# Patient Record
Sex: Female | Born: 1940
Health system: Southern US, Community
[De-identification: ages and names within clinical notes are randomized; demographics above are authoritative.]

## PROBLEM LIST (undated history)

## (undated) DIAGNOSIS — F329 Major depressive disorder, single episode, unspecified: Secondary | ICD-10-CM

## (undated) DIAGNOSIS — M76899 Other specified enthesopathies of unspecified lower limb, excluding foot: Secondary | ICD-10-CM

## (undated) DIAGNOSIS — I639 Cerebral infarction, unspecified: Secondary | ICD-10-CM

## (undated) DIAGNOSIS — R519 Headache, unspecified: Secondary | ICD-10-CM

## (undated) DIAGNOSIS — I6381 Other cerebral infarction due to occlusion or stenosis of small artery: Secondary | ICD-10-CM

## (undated) DIAGNOSIS — F32A Depression, unspecified: Secondary | ICD-10-CM

## (undated) DIAGNOSIS — R29898 Other symptoms and signs involving the musculoskeletal system: Secondary | ICD-10-CM

## (undated) DIAGNOSIS — F411 Generalized anxiety disorder: Secondary | ICD-10-CM

## (undated) DIAGNOSIS — M5430 Sciatica, unspecified side: Secondary | ICD-10-CM

## (undated) DIAGNOSIS — C801 Malignant (primary) neoplasm, unspecified: Secondary | ICD-10-CM

## (undated) DIAGNOSIS — I2510 Atherosclerotic heart disease of native coronary artery without angina pectoris: Secondary | ICD-10-CM

## (undated) DIAGNOSIS — H539 Unspecified visual disturbance: Secondary | ICD-10-CM

## (undated) DIAGNOSIS — R51 Headache: Secondary | ICD-10-CM

## (undated) DIAGNOSIS — I10 Essential (primary) hypertension: Secondary | ICD-10-CM

## (undated) DIAGNOSIS — E079 Disorder of thyroid, unspecified: Secondary | ICD-10-CM

## (undated) DIAGNOSIS — K219 Gastro-esophageal reflux disease without esophagitis: Secondary | ICD-10-CM

## (undated) DIAGNOSIS — M533 Sacrococcygeal disorders, not elsewhere classified: Secondary | ICD-10-CM

## (undated) DIAGNOSIS — I679 Cerebrovascular disease, unspecified: Secondary | ICD-10-CM

## (undated) DIAGNOSIS — Z87442 Personal history of urinary calculi: Secondary | ICD-10-CM

## (undated) DIAGNOSIS — M47812 Spondylosis without myelopathy or radiculopathy, cervical region: Secondary | ICD-10-CM

## (undated) HISTORY — DX: Major depressive disorder, single episode, unspecified: F32.9

## (undated) HISTORY — DX: Atherosclerotic heart disease of native coronary artery without angina pectoris: I25.10

## (undated) HISTORY — PX: EYE SURGERY: SHX253

## (undated) HISTORY — PX: BACK SURGERY: SHX140

## (undated) HISTORY — DX: Cerebrovascular disease, unspecified: I67.9

## (undated) HISTORY — PX: ABDOMINAL HYSTERECTOMY: SHX81

## (undated) HISTORY — DX: Headache: R51

## (undated) HISTORY — DX: Generalized anxiety disorder: F41.1

## (undated) HISTORY — DX: Sacrococcygeal disorders, not elsewhere classified: M53.3

## (undated) HISTORY — DX: Spondylosis without myelopathy or radiculopathy, cervical region: M47.812

## (undated) HISTORY — DX: Essential (primary) hypertension: I10

## (undated) HISTORY — DX: Disorder of thyroid, unspecified: E07.9

## (undated) HISTORY — DX: Sciatica, unspecified side: M54.30

## (undated) HISTORY — DX: Other cerebral infarction due to occlusion or stenosis of small artery: I63.81

## (undated) HISTORY — DX: Other specified enthesopathies of unspecified lower limb, excluding foot: M76.899

## (undated) HISTORY — DX: Other symptoms and signs involving the musculoskeletal system: R29.898

## (undated) HISTORY — DX: Depression, unspecified: F32.A

## (undated) HISTORY — PX: FOOT SURGERY: SHX648

## (undated) HISTORY — DX: Headache, unspecified: R51.9

## (undated) HISTORY — DX: Malignant (primary) neoplasm, unspecified: C80.1

## (undated) HISTORY — DX: Cerebral infarction, unspecified: I63.9

## (undated) HISTORY — DX: Unspecified visual disturbance: H53.9

---

## 1983-02-25 HISTORY — PX: BLADDER SURGERY: SHX569

## 2005-03-10 ENCOUNTER — Ambulatory Visit (HOSPITAL_COMMUNITY): Admission: RE | Admit: 2005-03-10 | Discharge: 2005-03-10 | Payer: Self-pay | Admitting: Internal Medicine

## 2005-05-02 ENCOUNTER — Emergency Department (HOSPITAL_COMMUNITY): Admission: EM | Admit: 2005-05-02 | Discharge: 2005-05-02 | Payer: Self-pay | Admitting: Emergency Medicine

## 2005-05-03 ENCOUNTER — Inpatient Hospital Stay (HOSPITAL_COMMUNITY): Admission: EM | Admit: 2005-05-03 | Discharge: 2005-05-05 | Payer: Self-pay | Admitting: Psychiatry

## 2005-05-03 ENCOUNTER — Ambulatory Visit: Payer: Self-pay | Admitting: Psychiatry

## 2005-05-10 ENCOUNTER — Encounter: Admission: RE | Admit: 2005-05-10 | Discharge: 2005-05-10 | Payer: Self-pay | Admitting: Specialist

## 2006-03-11 ENCOUNTER — Ambulatory Visit (HOSPITAL_COMMUNITY): Admission: RE | Admit: 2006-03-11 | Discharge: 2006-03-11 | Payer: Self-pay | Admitting: Internal Medicine

## 2006-03-19 ENCOUNTER — Inpatient Hospital Stay (HOSPITAL_COMMUNITY): Admission: AD | Admit: 2006-03-19 | Discharge: 2006-03-20 | Payer: Self-pay | Admitting: Psychiatry

## 2006-03-19 ENCOUNTER — Ambulatory Visit: Payer: Self-pay | Admitting: Psychiatry

## 2006-09-11 ENCOUNTER — Encounter: Admission: RE | Admit: 2006-09-11 | Discharge: 2006-09-11 | Payer: Self-pay | Admitting: Specialist

## 2006-10-19 ENCOUNTER — Ambulatory Visit (HOSPITAL_COMMUNITY): Admission: RE | Admit: 2006-10-19 | Discharge: 2006-10-19 | Payer: Self-pay | Admitting: *Deleted

## 2006-10-19 ENCOUNTER — Encounter (INDEPENDENT_AMBULATORY_CARE_PROVIDER_SITE_OTHER): Payer: Self-pay | Admitting: *Deleted

## 2006-12-21 ENCOUNTER — Encounter (INDEPENDENT_AMBULATORY_CARE_PROVIDER_SITE_OTHER): Payer: Self-pay | Admitting: *Deleted

## 2006-12-21 ENCOUNTER — Ambulatory Visit (HOSPITAL_COMMUNITY): Admission: RE | Admit: 2006-12-21 | Discharge: 2006-12-21 | Payer: Self-pay | Admitting: *Deleted

## 2007-03-04 ENCOUNTER — Ambulatory Visit: Payer: Self-pay | Admitting: Physical Medicine & Rehabilitation

## 2007-03-04 ENCOUNTER — Encounter
Admission: RE | Admit: 2007-03-04 | Discharge: 2007-05-24 | Payer: Self-pay | Admitting: Physical Medicine & Rehabilitation

## 2007-03-12 ENCOUNTER — Ambulatory Visit (HOSPITAL_COMMUNITY): Admission: RE | Admit: 2007-03-12 | Discharge: 2007-03-12 | Payer: Self-pay | Admitting: Internal Medicine

## 2007-03-18 ENCOUNTER — Encounter: Admission: RE | Admit: 2007-03-18 | Discharge: 2007-03-18 | Payer: Self-pay | Admitting: Internal Medicine

## 2007-03-19 ENCOUNTER — Encounter
Admission: RE | Admit: 2007-03-19 | Discharge: 2007-05-25 | Payer: Self-pay | Admitting: Physical Medicine & Rehabilitation

## 2007-05-21 ENCOUNTER — Ambulatory Visit: Payer: Self-pay | Admitting: Physical Medicine & Rehabilitation

## 2007-07-30 ENCOUNTER — Encounter
Admission: RE | Admit: 2007-07-30 | Discharge: 2007-10-01 | Payer: Self-pay | Admitting: Physical Medicine & Rehabilitation

## 2007-08-06 ENCOUNTER — Ambulatory Visit: Payer: Self-pay | Admitting: Physical Medicine & Rehabilitation

## 2007-08-10 ENCOUNTER — Encounter
Admission: RE | Admit: 2007-08-10 | Discharge: 2007-11-08 | Payer: Self-pay | Admitting: Physical Medicine & Rehabilitation

## 2007-09-02 ENCOUNTER — Ambulatory Visit: Payer: Self-pay | Admitting: Physical Medicine & Rehabilitation

## 2007-10-01 ENCOUNTER — Ambulatory Visit: Payer: Self-pay | Admitting: Physical Medicine & Rehabilitation

## 2007-10-18 ENCOUNTER — Ambulatory Visit: Payer: Self-pay | Admitting: Physical Medicine & Rehabilitation

## 2007-11-19 ENCOUNTER — Encounter
Admission: RE | Admit: 2007-11-19 | Discharge: 2007-11-22 | Payer: Self-pay | Admitting: Physical Medicine & Rehabilitation

## 2007-11-22 ENCOUNTER — Ambulatory Visit: Payer: Self-pay | Admitting: Physical Medicine & Rehabilitation

## 2007-11-23 ENCOUNTER — Encounter
Admission: RE | Admit: 2007-11-23 | Discharge: 2008-01-12 | Payer: Self-pay | Admitting: Physical Medicine & Rehabilitation

## 2008-01-17 ENCOUNTER — Ambulatory Visit (HOSPITAL_COMMUNITY): Admission: RE | Admit: 2008-01-17 | Discharge: 2008-01-17 | Payer: Self-pay | Admitting: *Deleted

## 2008-01-17 ENCOUNTER — Encounter (INDEPENDENT_AMBULATORY_CARE_PROVIDER_SITE_OTHER): Payer: Self-pay | Admitting: *Deleted

## 2008-02-11 ENCOUNTER — Ambulatory Visit: Payer: Self-pay | Admitting: Physical Medicine & Rehabilitation

## 2008-02-11 ENCOUNTER — Encounter
Admission: RE | Admit: 2008-02-11 | Discharge: 2008-02-11 | Payer: Self-pay | Admitting: Physical Medicine & Rehabilitation

## 2008-02-14 ENCOUNTER — Encounter
Admission: RE | Admit: 2008-02-14 | Discharge: 2008-05-14 | Payer: Self-pay | Admitting: Physical Medicine & Rehabilitation

## 2008-02-14 ENCOUNTER — Ambulatory Visit: Payer: Self-pay | Admitting: Physical Medicine & Rehabilitation

## 2008-03-17 ENCOUNTER — Encounter
Admission: RE | Admit: 2008-03-17 | Discharge: 2008-06-15 | Payer: Self-pay | Admitting: Physical Medicine & Rehabilitation

## 2008-03-20 ENCOUNTER — Ambulatory Visit: Payer: Self-pay | Admitting: Physical Medicine & Rehabilitation

## 2008-03-30 ENCOUNTER — Encounter
Admission: RE | Admit: 2008-03-30 | Discharge: 2008-06-28 | Payer: Self-pay | Admitting: Physical Medicine & Rehabilitation

## 2008-04-04 ENCOUNTER — Ambulatory Visit (HOSPITAL_COMMUNITY): Admission: RE | Admit: 2008-04-04 | Discharge: 2008-04-04 | Payer: Self-pay | Admitting: Internal Medicine

## 2008-04-20 ENCOUNTER — Ambulatory Visit: Payer: Self-pay | Admitting: Physical Medicine & Rehabilitation

## 2008-05-11 ENCOUNTER — Ambulatory Visit: Payer: Self-pay | Admitting: Diagnostic Radiology

## 2008-05-11 ENCOUNTER — Ambulatory Visit (HOSPITAL_BASED_OUTPATIENT_CLINIC_OR_DEPARTMENT_OTHER): Admission: RE | Admit: 2008-05-11 | Discharge: 2008-05-11 | Payer: Self-pay | Admitting: Family Medicine

## 2008-05-23 ENCOUNTER — Ambulatory Visit: Payer: Self-pay | Admitting: Physical Medicine & Rehabilitation

## 2008-06-23 ENCOUNTER — Encounter
Admission: RE | Admit: 2008-06-23 | Discharge: 2008-09-21 | Payer: Self-pay | Admitting: Physical Medicine & Rehabilitation

## 2008-06-28 ENCOUNTER — Ambulatory Visit: Payer: Self-pay | Admitting: Physical Medicine & Rehabilitation

## 2008-07-28 ENCOUNTER — Ambulatory Visit: Payer: Self-pay | Admitting: Physical Medicine & Rehabilitation

## 2008-09-11 ENCOUNTER — Encounter
Admission: RE | Admit: 2008-09-11 | Discharge: 2008-12-10 | Payer: Self-pay | Admitting: Physical Medicine & Rehabilitation

## 2008-09-13 ENCOUNTER — Ambulatory Visit: Payer: Self-pay | Admitting: Physical Medicine & Rehabilitation

## 2008-10-24 ENCOUNTER — Ambulatory Visit: Payer: Self-pay | Admitting: Physical Medicine & Rehabilitation

## 2008-11-16 ENCOUNTER — Ambulatory Visit: Payer: Self-pay | Admitting: Physical Medicine & Rehabilitation

## 2008-12-08 ENCOUNTER — Encounter
Admission: RE | Admit: 2008-12-08 | Discharge: 2009-02-21 | Payer: Self-pay | Admitting: Physical Medicine & Rehabilitation

## 2008-12-11 ENCOUNTER — Ambulatory Visit: Payer: Self-pay | Admitting: Physical Medicine & Rehabilitation

## 2008-12-27 ENCOUNTER — Ambulatory Visit (HOSPITAL_COMMUNITY)
Admission: RE | Admit: 2008-12-27 | Discharge: 2008-12-27 | Payer: Self-pay | Admitting: Physical Medicine & Rehabilitation

## 2009-02-24 HISTORY — PX: BACK SURGERY: SHX140

## 2009-02-27 ENCOUNTER — Inpatient Hospital Stay (HOSPITAL_COMMUNITY): Admission: RE | Admit: 2009-02-27 | Discharge: 2009-03-06 | Payer: Self-pay | Admitting: Specialist

## 2009-04-06 ENCOUNTER — Encounter
Admission: RE | Admit: 2009-04-06 | Discharge: 2009-07-05 | Payer: Self-pay | Source: Home / Self Care | Admitting: Physical Medicine & Rehabilitation

## 2009-04-11 ENCOUNTER — Ambulatory Visit: Payer: Self-pay | Admitting: Physical Medicine & Rehabilitation

## 2009-04-27 ENCOUNTER — Ambulatory Visit (HOSPITAL_COMMUNITY): Admission: RE | Admit: 2009-04-27 | Discharge: 2009-04-27 | Payer: Self-pay | Admitting: Internal Medicine

## 2009-05-08 ENCOUNTER — Ambulatory Visit: Payer: Self-pay | Admitting: Physical Medicine & Rehabilitation

## 2009-06-07 ENCOUNTER — Ambulatory Visit: Payer: Self-pay | Admitting: Physical Medicine & Rehabilitation

## 2009-07-04 ENCOUNTER — Encounter
Admission: RE | Admit: 2009-07-04 | Discharge: 2009-10-02 | Payer: Self-pay | Source: Home / Self Care | Admitting: Physical Medicine & Rehabilitation

## 2009-07-05 ENCOUNTER — Ambulatory Visit: Payer: Self-pay | Admitting: Physical Medicine & Rehabilitation

## 2009-08-09 ENCOUNTER — Ambulatory Visit: Payer: Self-pay | Admitting: Physical Medicine & Rehabilitation

## 2009-09-19 ENCOUNTER — Ambulatory Visit: Payer: Self-pay | Admitting: Physical Medicine & Rehabilitation

## 2009-10-11 ENCOUNTER — Encounter
Admission: RE | Admit: 2009-10-11 | Discharge: 2009-12-21 | Payer: Self-pay | Admitting: Physical Medicine & Rehabilitation

## 2009-10-18 ENCOUNTER — Ambulatory Visit: Payer: Self-pay | Admitting: Physical Medicine & Rehabilitation

## 2009-11-15 ENCOUNTER — Ambulatory Visit: Payer: Self-pay | Admitting: Physical Medicine & Rehabilitation

## 2009-12-21 ENCOUNTER — Encounter
Admission: RE | Admit: 2009-12-21 | Discharge: 2010-02-23 | Payer: Self-pay | Source: Home / Self Care | Attending: Physical Medicine & Rehabilitation | Admitting: Physical Medicine & Rehabilitation

## 2009-12-26 ENCOUNTER — Ambulatory Visit: Payer: Self-pay | Admitting: Physical Medicine & Rehabilitation

## 2010-03-05 ENCOUNTER — Encounter
Admission: RE | Admit: 2010-03-05 | Discharge: 2010-03-07 | Payer: Self-pay | Source: Home / Self Care | Attending: Physical Medicine & Rehabilitation | Admitting: Physical Medicine & Rehabilitation

## 2010-03-07 ENCOUNTER — Ambulatory Visit
Admission: RE | Admit: 2010-03-07 | Discharge: 2010-03-07 | Payer: Self-pay | Source: Home / Self Care | Attending: Physical Medicine & Rehabilitation | Admitting: Physical Medicine & Rehabilitation

## 2010-04-24 ENCOUNTER — Ambulatory Visit: Payer: Medicare Other | Admitting: Physical Medicine & Rehabilitation

## 2010-04-24 ENCOUNTER — Encounter: Payer: Medicare Other | Attending: Physical Medicine & Rehabilitation

## 2010-04-24 DIAGNOSIS — M533 Sacrococcygeal disorders, not elsewhere classified: Secondary | ICD-10-CM

## 2010-04-24 DIAGNOSIS — M961 Postlaminectomy syndrome, not elsewhere classified: Secondary | ICD-10-CM

## 2010-04-24 DIAGNOSIS — M659 Synovitis and tenosynovitis, unspecified: Secondary | ICD-10-CM

## 2010-04-24 DIAGNOSIS — IMO0002 Reserved for concepts with insufficient information to code with codable children: Secondary | ICD-10-CM

## 2010-04-24 DIAGNOSIS — M2170 Unequal limb length (acquired), unspecified site: Secondary | ICD-10-CM | POA: Insufficient documentation

## 2010-04-24 DIAGNOSIS — Z981 Arthrodesis status: Secondary | ICD-10-CM | POA: Insufficient documentation

## 2010-04-24 DIAGNOSIS — M545 Low back pain, unspecified: Secondary | ICD-10-CM | POA: Insufficient documentation

## 2010-04-24 DIAGNOSIS — IMO0001 Reserved for inherently not codable concepts without codable children: Secondary | ICD-10-CM | POA: Insufficient documentation

## 2010-04-24 DIAGNOSIS — K594 Anal spasm: Secondary | ICD-10-CM | POA: Insufficient documentation

## 2010-04-24 DIAGNOSIS — F341 Dysthymic disorder: Secondary | ICD-10-CM | POA: Insufficient documentation

## 2010-05-12 LAB — BASIC METABOLIC PANEL
BUN: 8 mg/dL (ref 6–23)
Sodium: 138 mEq/L (ref 135–145)

## 2010-05-12 LAB — HEMOGLOBIN AND HEMATOCRIT, BLOOD
HCT: 22.4 % — ABNORMAL LOW (ref 36.0–46.0)
HCT: 24.6 % — ABNORMAL LOW (ref 36.0–46.0)
Hemoglobin: 10.3 g/dL — ABNORMAL LOW (ref 12.0–15.0)
Hemoglobin: 7.5 g/dL — ABNORMAL LOW (ref 12.0–15.0)

## 2010-05-12 LAB — CROSSMATCH: ABO/RH(D): AB NEG

## 2010-05-17 ENCOUNTER — Other Ambulatory Visit (HOSPITAL_COMMUNITY): Payer: Self-pay | Admitting: Internal Medicine

## 2010-05-17 DIAGNOSIS — Z1231 Encounter for screening mammogram for malignant neoplasm of breast: Secondary | ICD-10-CM

## 2010-05-17 NOTE — Assessment & Plan Note (Signed)
Alexandria Mclaughlin is back regarding her low back/buttock pain.  She saw Dr. Otelia Sergeant recently and no specific changes were noted.  I reviewed some of the copies of her x-rays, which really showed no acute abnormalities.  She continues to have right gluteal/buttock pain, worse with prolonged sitting and standing.  She seems to have more pain with pressure than anything else there.  The sitting actually bothers her more from the actual pressure of sitting rather than the flexed position.  She occasionally has radiation of pain down the leg, but usually it is down the lateral hip into the foot.  She is a bit disenchanted, but fact that pain seems to be getting a bit worse again.  She does not like the prospects of having longer-term pain problems.  REVIEW OF SYSTEMS:  Notable for the above.  Full 12-point review is in the written health and history section of the chart.  SOCIAL HISTORY:  The patient is living alone.  She is still active with her church and trying to travel.  PHYSICAL EXAMINATION:  VITAL SIGNS:  Blood pressure is 158/80, pulse 91, respiratory rate 18.  She is satting 98% on room air. GENERAL:  The patient is pleasant, alert and oriented x3.  Affect is generally bright and appropriate. MUSCULOSKELETAL:  She has fair flexion and extension in the lumbar spine.  Rotation, lateral bending are unremarkable.  She did have pain still on tapping near the piriformis,  obturator internus muscles.  Pain seemed to be more medially towards the sacrum and sacral ligaments there as well today.  Straight leg testing was negative.  Strength is 5/5, both lower extremities with normal sensory function and reflexes.  ASSESSMENT: 1. Persistent low back and right buttock pain.  The patient has     history of lumbar laminectomy and fusion.  Certainly, there is     reproducible pain around the piriformis region upon palpation.     There has always been a component of S1 radiculitis as well. 2. Left leg length  discrepancy. 3. Reactive depression.  PLAN: 1. We decided to try an injection near the origination of the superior     gemellus, obturator internus and piriformis muscles along the     lateral border of the sacrum.  We used 40 mg of Kenalog and 4 mL of     1% lidocaine.  Sterile technique was used and we aspirated prior to     injection of the solution around this attachment area.  The patient     tolerated well and felt that the injection pressure was at the site     of her pain today.  We will observe for results. 2. We will increase fentanyl patch 75 mcg. 3. Refilled Percocet 10 #100. 4. I will see her back here in about 2 months.  She will follow up     with nursing in about a month.     Ranelle Oyster, M.D. Electronically Signed    ZTS/MedQ D:  04/24/2010 10:49:15  T:  04/24/2010 11:18:55  Job #:  [id number]  cc:   Georgianne Fick, M.D. Fax: [phone]  Kerrin Champagne, M.D. Fax: [phone]

## 2010-05-22 ENCOUNTER — Ambulatory Visit (HOSPITAL_COMMUNITY)
Admission: RE | Admit: 2010-05-22 | Discharge: 2010-05-22 | Disposition: A | Discharge status: Home / Self Care | Payer: Medicare Other | Source: Ambulatory Visit | Attending: Internal Medicine | Admitting: Internal Medicine

## 2010-05-22 DIAGNOSIS — Z1231 Encounter for screening mammogram for malignant neoplasm of breast: Secondary | ICD-10-CM

## 2010-05-23 ENCOUNTER — Encounter: Payer: Medicare Other | Attending: Physical Medicine & Rehabilitation

## 2010-05-23 ENCOUNTER — Ambulatory Visit: Payer: Medicare Other

## 2010-05-23 DIAGNOSIS — M76899 Other specified enthesopathies of unspecified lower limb, excluding foot: Secondary | ICD-10-CM

## 2010-05-23 DIAGNOSIS — M961 Postlaminectomy syndrome, not elsewhere classified: Secondary | ICD-10-CM

## 2010-05-23 DIAGNOSIS — M2170 Unequal limb length (acquired), unspecified site: Secondary | ICD-10-CM | POA: Insufficient documentation

## 2010-05-23 DIAGNOSIS — IMO0001 Reserved for inherently not codable concepts without codable children: Secondary | ICD-10-CM | POA: Insufficient documentation

## 2010-05-23 DIAGNOSIS — M545 Low back pain, unspecified: Secondary | ICD-10-CM | POA: Insufficient documentation

## 2010-05-23 DIAGNOSIS — M533 Sacrococcygeal disorders, not elsewhere classified: Secondary | ICD-10-CM

## 2010-05-23 DIAGNOSIS — F341 Dysthymic disorder: Secondary | ICD-10-CM | POA: Insufficient documentation

## 2010-05-27 LAB — URINE MICROSCOPIC-ADD ON

## 2010-05-27 LAB — PROTIME-INR: Prothrombin Time: 13.4 seconds (ref 11.6–15.2)

## 2010-05-27 LAB — URINE CULTURE: Colony Count: 60000

## 2010-05-27 LAB — DIFFERENTIAL
Basophils Absolute: 0 10*3/uL (ref 0.0–0.1)
Basophils Relative: 0 % (ref 0–1)
Lymphs Abs: 2.5 10*3/uL (ref 0.7–4.0)
Monocytes Absolute: 0.4 10*3/uL (ref 0.1–1.0)
Monocytes Relative: 6 % (ref 3–12)
Neutrophils Relative %: 49 % (ref 43–77)

## 2010-05-27 LAB — CBC
HCT: 40.2 % (ref 36.0–46.0)
Hemoglobin: 13.4 g/dL (ref 12.0–15.0)
MCHC: 33.4 g/dL (ref 30.0–36.0)
MCV: 94.6 fL (ref 78.0–100.0)
Platelets: 211 10*3/uL (ref 150–400)

## 2010-05-27 LAB — URINALYSIS, ROUTINE W REFLEX MICROSCOPIC
Bilirubin Urine: NEGATIVE
Ketones, ur: NEGATIVE mg/dL
Nitrite: NEGATIVE
Protein, ur: NEGATIVE mg/dL
Urobilinogen, UA: 0.2 mg/dL (ref 0.0–1.0)

## 2010-05-27 LAB — COMPREHENSIVE METABOLIC PANEL
AST: 18 U/L (ref 0–37)
Albumin: 4.5 g/dL (ref 3.5–5.2)
CO2: 27 mEq/L (ref 19–32)
Potassium: 4 mEq/L (ref 3.5–5.1)
Total Bilirubin: 1 mg/dL (ref 0.3–1.2)
Total Protein: 6.9 g/dL (ref 6.0–8.3)

## 2010-05-29 LAB — CREATININE, SERUM
Creatinine, Ser: 0.54 mg/dL (ref 0.4–1.2)
GFR calc Af Amer: >60 mL/min (ref >60)
GFR calc non Af Amer: >60 mL/min (ref >60)

## 2010-06-25 ENCOUNTER — Encounter: Payer: Medicare Other | Attending: Physical Medicine & Rehabilitation | Admitting: Physical Medicine & Rehabilitation

## 2010-06-25 DIAGNOSIS — M545 Low back pain, unspecified: Secondary | ICD-10-CM | POA: Insufficient documentation

## 2010-06-25 DIAGNOSIS — M76899 Other specified enthesopathies of unspecified lower limb, excluding foot: Secondary | ICD-10-CM

## 2010-06-25 DIAGNOSIS — M961 Postlaminectomy syndrome, not elsewhere classified: Secondary | ICD-10-CM

## 2010-06-25 DIAGNOSIS — M2170 Unequal limb length (acquired), unspecified site: Secondary | ICD-10-CM | POA: Insufficient documentation

## 2010-06-25 DIAGNOSIS — M659 Synovitis and tenosynovitis, unspecified: Secondary | ICD-10-CM

## 2010-06-25 DIAGNOSIS — IMO0001 Reserved for inherently not codable concepts without codable children: Secondary | ICD-10-CM | POA: Insufficient documentation

## 2010-06-25 DIAGNOSIS — F341 Dysthymic disorder: Secondary | ICD-10-CM | POA: Insufficient documentation

## 2010-06-25 DIAGNOSIS — M533 Sacrococcygeal disorders, not elsewhere classified: Secondary | ICD-10-CM

## 2010-06-26 NOTE — Assessment & Plan Note (Signed)
La is back regarding low back and buttock pain.  We injected around her hip abductor muscles on the right at last visit and the patient experienced no appreciable results.  Her pain is still about 5-6/10. Sleep is fair.  She has poor appetite in general.  Full 12-point review of systems is in the written health and history section of the chart.  SOCIAL HISTORY:  Unchanged.  She is living alone, widowed.  She tries to stay active as always.  PHYSICAL EXAMINATION:  VITAL SIGNS:  Blood pressure is 144/59, pulse is 70, respiratory rate 18, she is satting 97% on room air. GENERAL:  The patient is pleasant, alert, and oriented x3.  Affect is generally bright and appropriate. MUSCULOSKELETAL:  She has had pain on palpation in the right piriformis region.  She has some tenderness in the low back as well.  Strength remains 5/5, normal sensory function in the lower extremities and upper extremities today. NEUROLOGICALLY:  She is intact.  ASSESSMENT: 1. Persistent low back and right buttock pain.  Patient status post     laminectomy and fusion.  Given her lack of response to anything     interventional in the hip/pelvis, one must conclude that this is     referred pain related to her surgery and low back.  It does not     seem to be a true radiculitis here per se. 2. Left leg length discrepancy. 3. Reactive depression. 4. Likely centralization of this pain due to chronicity.  PLAN: 1. We discussed the treatment options at length in this visit today.     She could consider trial of the spinal stimulator, but obviously     there are drawbacks to that as well.  She has had some relief with     her TENS unit which she continues to use. 2. We decided to attack pain more from the central aspect today.  I     will retrial Lyrica which I believe she has used in the past before     coming here, weaning off the Neurontin over a course of  a month or     two. 3. Before that, we will trial Pamelor  10-20 mg bedtime for sleep and     neuropathic pain. 4. Consider cycling narcotics. 5. She will stay with her Cymbalta as previously written. 6. She will call me with any problems or questions.     Ranelle Oyster, M.D. Electronically Signed    ZTS/MedQ D:  06/25/2010 11:53:41  T:  06/26/2010 00:37:17  Job #:  [id number]  cc:   Kerrin Champagne, M.D. Fax: [phone]  Georgianne Fick, M.D. Fax: [phone]

## 2010-07-09 NOTE — Op Note (Signed)
NAMESHARLETT, LIENEMANN                   ACCOUNT NO.:  [id number]   MEDICAL RECORD NO.:  [id number]          PATIENT TYPE:  AMB   LOCATION:  ENDO                         FACILITY:  [facility]   PHYSICIAN:  Georgiana Spinner, M.D.    DATE OF BIRTH:  1940-07-20   DATE OF PROCEDURE:  10/19/2006  DATE OF DISCHARGE:                               OPERATIVE REPORT   PROCEDURE:  Upper endoscopy.   INDICATIONS:  Hemoccult positivity.   ANESTHESIA:  Fentanyl 125 mcg, Versed 8 mg, Benadryl 25 mg.   DESCRIPTION OF PROCEDURE:  With the patient mildly sedated in the left  lateral decubitus position, the Pentax videoscopic endoscope was  inserted in the mouth, passed under direct vision through the esophagus  which appeared normal; and the stomach, fundus, and body appeared  normal.  In the antrum, I could not tell if this was a prepyloric ulcer  or if this was actually a duodenal ulcer; it may be the latter.  I could  photograph this, found an ulcer, it had a flat whitish base with a red  spot which, I think, was just a clot that I photographed and biopsied.  We entered into the second portion of the duodenum which appeared  normal.   From this point, the endoscope was slowly withdrawn taking  circumferential views of the duodenal mucosa until the endoscope had  been pulled back into stomach, placed in retroflexion, to view the  stomach from below.  The endoscope was straightened and withdrawn taking  circumferential views of the remaining gastric and esophageal mucosa.  The patient's vital signs and pulse oximeter remained stable.  The  patient tolerated the procedure well without apparent complications.   FINDINGS:  Ulcer probably just in the duodenum with a clot; and this was  photographed.  Biopsies taken.  Also biopsies were taken of the stomach  to rule out H. pylori.  The patient's vital signs and pulse oximeter  remained stable.  The patient tolerated the procedure well without  apparent  complications.   FINDINGS:  1. Ulcer presumably of duodenal bulb just past the pyloric channel.  2. Await biopsy report.  3. The patient will call me for results and follow-up with me as an      outpatient.  4. Will start the patient on PPI therapy and eliminate any NSAIDs that      the patient may be taking.           ______________________________  Georgiana Spinner, M.D.     GMO/MEDQ  D:  10/19/2006  T:  10/19/2006  Job:  [id number]

## 2010-07-09 NOTE — Assessment & Plan Note (Signed)
The patient, scheduled for a sacro-iliac joint.  She has had an  injection back a few months ago, really lasted only a few days.  The one  before only lasted less than 2 weeks.  She is here for another  sacroiliac injection.  I did discuss this with the patient and given her  poor result with prior injections, do not think repeat would be helpful.  She was asking me about other potential treatment options including from  an interventional stand-point.  She states that all her pain is below  the level of fusion in terms of the buttock area.  This would make the  facets less likely as the pain generator although this is a possibility.  She has had a Piriformis injection in the past before as well.   PHYSICAL EXAMINATION:  VITAL SIGNS:  Blood pressure 175/72 ,  pulse 74,  respirations 18, O2 sat 100% room air.  Her mood and affect appropriate.  Her gait is normal.   IMPRESSION:  Lumbar post-laminectomy syndrome.  As I discussed with the  patient the only other interventional procedure that I could think maybe  helpful would be the facet injections; however, once again, her painful  area is really in the buttocks, which makes this somewhat less likely.  We will have her follow up with Dr. Cato Mulligan, to discuss further with him  and be happy to try this if this is felt to be the casual pain  generator.      Erick Colace, M.D.  Electronically Signed     AEK/MedQ  D:  02/14/2008 13:34:01  T:  02/15/2008 04:53:19  Job #:  [id number]

## 2010-07-09 NOTE — Assessment & Plan Note (Signed)
Ms. Edelstein is back regarding her right gluteal/sacral pain and low back  pain.  She had a day's worth of relief after last injection and after  that results seemed to subside.  Her pain overall is much improved and  she has had fewer episodes where pain has really spiked.  She rates her  pain a 5 to 6/10 today.  She went on her trip to [place] and did very well  with that.  She notes that pain seemed to be better the more she was  moving and active.  She sometimes does not see a rhyme or reason as to  the onset.  Often, it bothers her in the morning and can bother her at  night sometimes as well.  She does relieved with stretching, as well as  with the Percocet to a certain extent.   REVIEW OF SYSTEMS:  Notable for the above, as well as some anxiety.  Full review is in the written health and history section of the chart.   SOCIAL HISTORY:  Patient is widowed and active as above.   PHYSICAL EXAM:  Blood pressure is 149/84.  Pulse is 60.  Respiratory  rate 18.  She is satting 98% on room air.  Patient is pleasant, alert  and oriented x3.  Continues to have asymmetries of leg length and pelvis  with the pelvis still elevated to the right without her shoe insert in  place.  She has had pain around the piriformis area along the sacral  side, as well as the gluteus medius muscle and minimus muscles once  again.  Continues to have pain with external rotation of the hip.  Motor  and sensory exam were normal for both lower extremities.  Lumbar spine  exam was stable.  HEART:  Regular.  CHEST:  Clear.  ABDOMEN:  Soft and nontender.   ASSESSMENT:  1. History of L5-S1 laminectomy/fusion.  2. Myofascial pain in the piriformis, as well as the gluteus medius      and minimus muscles.   PLAN:  1. After informed consent, we injected along the piriformis and      gluteus medius muscles at the sacral ridge using 60 mg Kenalog      total included in 6 mL 1% lidocaine.  We injected essentially at 4  different areas.  2. Continue Percocet for breakthrough pain with Robaxin for spasms.  3. We increased Neurontin to 600 mg t.i.d.  4. I will see her back in 6 to 8 weeks' time.  Overall, she has made      progress.      Ranelle Oyster, M.D.  Electronically Signed     ZTS/MedQ  D:  05/24/2007 11:22:41  T:  05/24/2007 12:37:02  Job #:  [id number]   cc:   Kerrin Champagne, M.D.  Fax: [phone]

## 2010-07-09 NOTE — Assessment & Plan Note (Signed)
Zykeria is back regarding her right hip pain.  She states that she had 2  or 3 days of relief after the last injection.  Injection report states  that she went from 6/10 to 0/10 after the injection immediately.  Today,  she reports that her pain is 7/10.  She is taking Percocet without much  relief at this point.  She is becoming increasingly frustrated.  She  states that the pain is sharp, stabbing, and constant.  It seems to  worsen with prolonged sitting and standing.  Sleep is fair, although she  has to change positions lot of times.  She does not take the Robaxin  anymore as it is not helpful.  She still uses the Neurontin and Mobic.   REVIEW OF SYSTEMS:  Notable for the above.  Full review is in the health  and history section of the chart.   SOCIAL HISTORY:  Unchanged.  She remains active and volunteering at [employer].   PHYSICAL EXAMINATION:  VITAL SIGNS:  Blood pressure is 132/78, pulse is  70, respiratory rate 20, and she is sating 98% on room air.  GENERAL:  The patient is pleasant, alert, and oriented x3.  She walks  with some antalgia favoring the right side.  Compression test is  equivocal.  Luisa Hart test is positive.  Ober's test was equivocal.  Straight leg testing was negative.  Her strength is 5/5.  Normal  reflexes and sensory exam of both legs.  Back posture and pelvic posture  are fair.  She is cognitively intact with normal cranial nerve exam.  HEART:  Regular.  CHEST:  Clear.  ABDOMEN:  Soft and nontender.   ASSESSMENT:  1. Persistent right low back/pelvic pain with the history of L5-S1      laminectomy and fusion.  She had 2-3 day response to right      sacroiliac joint injection.  2. Left leg length discrepancy.   PLAN:  1. We will send the patient back to Dr. Wynn Banker for a repeat SI      joint injection.  Consider RF.  2. Continue Neurontin and Mobic.  3. Change Percocet to Opana 5 mg 1-2 q.6 h. p.r.n.  4. I will see her back pending the  above.      Ranelle Oyster, M.D.  Electronically Signed     ZTS/MedQ  D:  10/01/2007 16:07:20  T:  10/02/2007 07:00:00  Job #:  [id number]   cc:   Kerrin Champagne, M.D.  Fax: [phone]

## 2010-07-09 NOTE — Assessment & Plan Note (Signed)
HISTORY:  Alexandria Mclaughlin is back regarding her right buttock pain.  She had a  day's worth yet again of relief with the piriformis injections in March.  Pain continues to give her a problem with a right low back and into the  buttock region without radiation into the leg.  The Neurontin helped  somewhat, but nothing significant.  Percocet is really not helping her  pain when it is bothering her more.  She rates her pain as 6 out of 10.  She described it as sharp and constant.  Pain interferes with the  general activity, relation with others, enjoyment of life on a moderate  level.  Pain is worse with sitting and prolonged standing.   REVIEW OF SYSTEMS:  Notable for depression and anxiety.   SOCIAL HISTORY:  The patient is widowed and active with a church.   PHYSICAL EXAMINATION:  VITAL SIGNS:  Blood pressure 132/59, pulse 79,  respiratory rate 18, she is sating 96% on room air.  GENERAL:  The patient is pleasant, alert, and oriented x3.  She  continues to walk with some elevation of the right hemipelvis.  She has  pain around the right piriformis area and posterior greater trochanteric  region.  The right PSIS area is also tender as well as the lumbar  spine/sacral spine.  Pain was provoked  somewhat with Luisa Hart test  today, to a lesser extent to piriformis maneuvers.  She had some pain  with compression.  Strength is 5/5 in both legs.  Normal sensory  function.  Reflexes are 2+ bilaterally.   ASSESSMENT:  1. History of L5-S1 laminectomy/fusion.  2. Persistent right buttock pain.  3. She does not have radiation down the leg.  We have been chasing      myofascial pain as well as this pelvic pain related to donor site.      However this presentation may be more consistent with the right      sacroiliac joint problem.  4. Left leg length discrepancy.   PLAN:  1. I will send the patient to Dr. Wynn Banker for a right sacroiliac      joint injection for treatment/diagnostic purposes.  2.  Continue Neurontin and Mobic as well as Robaxin for spasms as      indicated.  3. Continue with stretching exercises.  4. I will refill Percocet 10/325 dose 1 q.6 h. p.r.n.  5. I will see her back pending the above.      Ranelle Oyster, M.D.  Electronically Signed     ZTS/MedQ  D:  08/06/2007 11:32:49  T:  08/07/2007 03:48:06  Job #:  [id number]   cc:   Kerrin Champagne, M.D.  Fax: [phone]

## 2010-07-09 NOTE — Assessment & Plan Note (Signed)
Alexandria Mclaughlin is back regarding her chronic right low back and buttock pain.  I  discussed her case with Dr. Wynn Banker and he felt that considering her  hardware that was impossible really to successfully RF the needed levels  to block the joint.  We increased her fentanyl patch to 50 mcg at last  visit, and she seems to have done quite well with that.  Pain is  manageable at 5/10.  She is going to [place] tomorrow and is excited about  that.  She uses Percocet for breakthrough pain.  Sleep is fair.  Pain  interferes with general activity, relations with others, enjoyment of  life on a mild-to-moderate level.  Pain worsens with prolonged standing  and sitting.   REVIEW OF SYSTEMS:  Notable for depression, anxiety although is under  fair control currently.  Other pertinent positives are above and full 14-  point review is in the written health and history section above.   SOCIAL HISTORY:  As noted above.   PHYSICAL EXAMINATION:  Blood pressure is 139/68, pulse is 68,  respiratory rate 18.  She is sating 98% on room air.  The patient is  pleasant, alert, and oriented x3.  She continues to have pain along the  PSIS areas as well as the insertion of the external hip rotators.  She  has some spasm there once again.  Strength is 5/5 and normal sensory  exam.  She is able to almost touch her toes today.  Chest is clear.  Abdomen is soft, nontender.   ASSESSMENT:  1. Postlaminectomy syndrome L5-S1.  2. Leg length discrepancy.  3. Right sacroiliac joint dysfunction and external rotator spasm.   PLAN:  1. We will continue with fentanyl patch for now.  We discussed other      interventional options including spinal stimulator, though I think      we were away from that at this point.  I also consider Botox      injection.  2. Continue Percocet for breakthrough pain 10/325 q.6 h. p.r.n., #100.  3. Encourage activity and range of motion as she is doing.  4. We will see her back in 3 months with me, 1  month with nursing.      Ranelle Oyster, M.D.  Electronically Signed     ZTS/MedQ  D:  06/28/2008 10:42:33  T:  06/29/2008 00:34:25  Job #:  [id number]   cc:   Kerrin Champagne, M.D.  Fax: [phone]

## 2010-07-09 NOTE — Assessment & Plan Note (Signed)
HISTORY:  Alexandria Mclaughlin is back regarding her right buttock pain.  Dr. Wynn Mclaughlin  deferred the SI joint injection in December as he did not feel that she  had a long enough response from either of the first two.  My thoughts  were to consider RF of the right SI joint  in addition to the injection.  We started on low-dose fentanyl patch 12 mcg in December, which did not  provide lateral benefit.  The patient rates her pain 7/10, described as  sharp and constant.  Pain interferes with general activity, relations  with others, enjoyment of life on a moderate level.   REVIEW OF SYSTEMS:  Notable for depression and anxiety.  Other pertinent  positives are above and full review is in the written health history  section of the chart.  The patient denies constipation at this point.   SOCIAL HISTORY:  The patient is widowed and remains active with her  church as always.   PHYSICAL EXAMINATION:  VITAL SIGNS:  Blood pressure is 156/89, pulse is  56, respiratory rate 18, and she is sating 96% on room air.  GENERAL:  The patient is pleasant, alert, and oriented x3.  She had some  pain with moving from a seated to standing position today.  Bending  cause pain as well.  She has pain in the right PSIS areas as well as the  piriformis region.  Extension seemed to cause little pain today.  There  is some pain with bending to the right.  Strength is 5/5 and sensory  exam is intact in both lower extremities.  HEART:  Regular.  CHEST:  Clear.  ABDOMEN:  Soft and nontender.   ASSESSMENT:  1. Post laminectomy syndrome, L5-S1.  2. Left leg length discrepancy.  3. Right sacroiliac joint syndrome secondary to above.   PLAN:  1. We will send the patient to Dr. Wynn Mclaughlin for right SI joint      radiofrequency ablation.  2. Increase fentanyl patch 25 mcg q.72 h.  3. Percocet 10/325 one q.6 h. p.r.n. breakthrough pain.  4. I will refill Paxil 20 mg p.o. daily.  5. Could consider Botox to the piriformis muscle as  well depending on      response to the above.  I am not sure that facet intervention      would provide much relief considering location of pain.      Ranelle Oyster, M.D.  Electronically Signed     ZTS/MedQ  D:  03/20/2008 12:02:59  T:  03/21/2008 02:22:53  Job #:  [id number]   cc:   Kerrin Champagne, M.D.  Fax: [phone]

## 2010-07-09 NOTE — Assessment & Plan Note (Signed)
Alexandria Mclaughlin is back regarding her chronic right low back and buttock pain.  We  stay with her current regimen fentanyl and Percocet.  She notes that on  day 3 of the fentanyl patch that pain seems to be increased.  She has  had a busy summer with trips to [place] with her church, etc.  That has  been a bit difficult at times.  She still states that her back and low  and upper buttock region in the right are worse with prolonged sitting  or standing.  She does better when she walks and stays mobile.  Her pain  is 5/10, described as intermittent sharp and stabbing.  Pain interferes  with general activities, relations with others, and enjoyment of life  from a mild-to-moderate level.  Sleep is fair.   REVIEW OF SYSTEMS:  Notable for some depression, anxiety with some  suicidal ideations at times, but nothing that she would carry through  with.  These ideation seemed to coincide more with increased and longer  duration pain.  Full review is in the written health and history section  of the chart.   SOCIAL HISTORY:  Noted above as she has been active with church and  traveling.   PHYSICAL EXAMINATION:  VITAL SIGNS:  Blood pressure is 151/79, pulse is  57, and respiratory rate 18.  She is sating 100% on room air.  GENERAL:  The patient is pleasant, alert, and oriented x3.  She has pain  in her familiar area over the right PSIS area and along the insertion of  her external hip rotators.  There is some associated spasm there as  well.  Strength and sensory exam from both lower extremities are normal  with 2/2 reflexes.  She has some pain at the operative site.  CHEST:  Clear.  ABDOMEN:  Soft, nontender.   ASSESSMENT:  1. Postlaminectomy syndrome, L5-S1.  2. Right buttock pain with potential myofascial dysfunction versus      referred pain from lumbar spine/sacroiliac joint.  3. Leg length discrepancy.   PLAN:  1. We will increase fentanyl frequency to q.48 h., 50 mcg.  Number 48      were  written today.  Also, on 100 Percocet 10/325 one q.6 h. p.r.n.  2. Encouraging continue activity as she has been doing.  3. I gave her literature on a spinal stimulator.  I am not really sure      what is to offer her at this point.  There is  certainly a      possibility that this pain is referred from her post laminectomy      syndrome, perhaps hardware.  Might be worthwhile to discuss with      Dr. Otelia Sergeant.  Also with a more recent literature on low back and      postoperative pain indicates that the removal of hardware could be      helpful in these type situations.  We discussed Botox potentially      for the spastic muscle and I commence that this will provide any      long-term benefits of either.  4. I will see her back in 3 months and 1 month with nursing.      Alexandria Mclaughlin, M.D.  Electronically Signed    ZTS/MedQ  D:  09/13/2008 12:31:28  T:  09/14/2008 01:39:24  Job #:  [id number]   cc:   Alexandria Mclaughlin, M.D.  Fax: [phone]

## 2010-07-09 NOTE — Op Note (Signed)
Alexandria Mclaughlin, Alexandria Mclaughlin                   ACCOUNT NO.:  [id number]   MEDICAL RECORD NO.:  [id number]          PATIENT TYPE:  AMB   LOCATION:  ENDO                         FACILITY:  [facility]   PHYSICIAN:  Georgiana Spinner, M.D.    DATE OF BIRTH:  Nov 11, 1940   DATE OF PROCEDURE:  12/21/2006  DATE OF DISCHARGE:                               OPERATIVE REPORT   PROCEDURE:  Upper endoscopy.   INDICATIONS:  Ulcer, for follow-up.   ANESTHESIA:  Fentanyl 70 mcg, Versed 8 mg.   PROCEDURE:  With the patient mildly sedated in the left lateral  decubitus position, the Pentax videoscopic endoscope was inserted in the  mouth, passed under direct vision through the esophagus, which appeared  normal at first view, into the stomach.  Fundus, body appeared normal.  Antrum showed diffuse erythema, which was photographed and biopsied.  Duodenal bulb, second portion of duodenum appeared normal.  From this  point the endoscope was slowly withdrawn taking circumferential views of  duodenal mucosa until the endoscope had been pulled back in the stomach,  placed in retroflexion to view the stomach from below.  The endoscope  was straightened and withdrawn taking circumferential views of the  remaining gastric and esophageal mucosa, stopping in the distal  esophagus to photograph and biopsy one area that could be Barrett's.  The endoscope was withdrawn.  The patient's vital signs and pulse  oximetry remained stable.  The patient tolerated the procedure well  without apparent complications.   FINDINGS:  1. An area of diffuse erythema of the antrum, biopsied.  2. Question of Barrett's esophagus, biopsied.   Await biopsy report.  The patient will call me for results and follow up  with me as an outpatient.           ______________________________  Georgiana Spinner, M.D.     GMO/MEDQ  D:  12/21/2006  T:  12/21/2006  Job:  [id number]

## 2010-07-09 NOTE — Assessment & Plan Note (Signed)
Alexandria Mclaughlin is back regarding her chronic right low back/buttock pain.  Dr.  Fritzi Mandes saw her for a sacroiliac RF on April 20, 2008, because of her  L5-S1 fusion.  The accessed points for the RFs were partially blocked  and he chose not to perform.  She had about 2 weeks or so of relief with  the injection, but not as great as she had previously.  Today her pain  is 6/10.  Oswestry score is 32%.  Pain is sharp, intermittent stabbing,  aching, and constant.  She is on Percocet 10/325 one q.6 h. p.r.n. for  breakthrough pain and fentanyl patch 25 mcg q.72 h., which has helped  somewhat.  Pain interferes with general activity, relations with others,  and enjoyment of life on a moderate level.  Sleep is good.   REVIEW OF SYSTEMS:  Notable for decreased mood, anxiety.  She does mark  off  suicidal thoughts, but nothing that she is serious about carrying  through.  Full 14-point review is in the written health and history  section.   SOCIAL HISTORY:  Unchanged.  She is planning to go to [employer] and  out of the country next week because she likes to stay active and this  seems to help her cope with pain.   PHYSICAL EXAMINATION:  VITAL SIGNS:  Blood pressure is 159/88, pulse is  64, respiratory rate 16.  She is sating 97% on room air.  NEUROLOGIC:  The patient is pleasant, alert, and oriented x3.  Affect  bright and appropriate.  MUSCULOSKELETAL:  She continues to have some pain along the PSIS areas  as well as external rotator muscles of the buttock region.  Some spasm  as felt and reproduction of her pain is notable with palpation.  Strength remains 5/5 with normal sensory exam and 2+ reflexes in both  legs.  The piriformis testing was provocative as pain with the bending  forward and to the right.  CHEST:  Clear.  ABDOMEN:  Soft and nontender.   ASSESSMENT:  1. Postlaminectomy syndrome, L5-S1.  2. Left leg discrepancy.  3. Right sacroiliac joint dysfunction, partially responsive to     sacroiliac joint injection.   PLAN:  1. We will discuss with Dr. Wynn Banker regarding options for SI joint.      She may need to look at a spinal stimulator as well.  2. We will increase her fentanyl patch again to 50 mcg q.72 h.  3. Refill Percocet 10/325 one q.6 h. P.r.n., #100.  4. I will see her back in about a month to follow up.      Alexandria Mclaughlin, M.D.  Electronically Signed     ZTS/MedQ  D:  05/23/2008 11:40:35  T:  05/24/2008 01:15:22  Job #:  [id number]   cc:   Kerrin Champagne, M.D.  Fax: [phone]

## 2010-07-09 NOTE — Procedures (Signed)
Alexandria Mclaughlin, Alexandria Mclaughlin                   ACCOUNT NO.:  [id number]   MEDICAL RECORD NO.:  [id number]          PATIENT TYPE:  REC   LOCATION:  TPC                          FACILITY:  [facility]   PHYSICIAN:  Erick Colace, M.D.DATE OF BIRTH:  1940/03/22   DATE OF PROCEDURE:  09/02/2007  DATE OF DISCHARGE:                               OPERATIVE REPORT   PROCEDURE:  Right sacroiliac injection under fluoroscopic guidance.   INDICATIONS:  Right sacroiliac buttock pain without evidence of  radicular discomfort, status post L5-S1 fusion.  Pain interferes with  general activity relationship with other people and enjoyment of life at  a moderate level despite narcotic analgesic medications.   Informed consent was obtained after describing risks and benefits of the  procedure with the patient.  These include bleeding, bruising, and  infection.  She elected to proceed and has given written consent.  The  patient was placed prone on fluoroscopy table, Betadine prep, sterilely  draped.  A 25-gauge inch and half needle was used to anesthetize skin  and subcu tissue, 1% lidocaine x2 cc and 25-gauge 3 inch spinal needle  was inserted under fluoroscopic guidance at right SI joint, AP lateral  and oblique imaging utilized.  Omnipaque 180 initially showed  intravascular uptake and needle was readjusted and showed no evidence of  intravascular uptake followed by injection of 1 mL of 2% MPF lidocaine  and 1-1/2 mL of 40 mg/mL Depo-Medrol.  The patient tolerated the  procedure well.  Post-injection vitals are stable.   Post-injection instructions given.  Pre and post-injection vitals  stable.  Follow up with Dr. Riley Kill.  Pre injection pain level 6/10.  Post injection is 0-1/10.      Erick Colace, M.D.  Electronically Signed     AEK/MEDQ  D:  09/02/2007 11:59:23  T:  09/03/2007 00:53:13  Job:  [id number]

## 2010-07-09 NOTE — Op Note (Signed)
NAMEMEGGEN, SPAZIANI                   ACCOUNT NO.:  [id number]   MEDICAL RECORD NO.:  [id number]          PATIENT TYPE:  AMB   LOCATION:  ENDO                         FACILITY:  [facility]   PHYSICIAN:  Georgiana Spinner, M.D.    DATE OF BIRTH:  07-05-1940   DATE OF PROCEDURE:  DATE OF DISCHARGE:                               OPERATIVE REPORT   PROCEDURE:  Upper endoscopy.   INDICATIONS:  Hematemesis.   ANESTHESIA:  Fentanyl 100 mcg, Versed 10 mg.   PROCEDURE:  With the patient mildly sedated in the left lateral  decubitus position the Pentax videoscopic endoscope was inserted in the  mouth and passed under direct vision through the esophagus which  appeared reddened, but this was due to a technical difficulty with the  processor.  We withdrew the endoscope and rechecked the white balance,  reinserted the endoscope and still the area looked red.  We did a biopsy  of the distal esophagus and advanced distally through the fundus, body,  antrum of the stomach to duodenal bulb and then subsequently the second  portion of duodenum.  From this point, the endoscope was slowly  withdrawn taking circumferential views of duodenal mucosa until the  endoscope had been pulled back into the stomach, placed in retroflexion  to view the stomach from below.  The endoscope was straightened and  withdrawn taking circumferential views of the remaining gastric and  esophageal mucosa.  Subsequently, we found that the suction port had the  long valve and the water button was stuck and had to be rinsed and the  setting on the processor was changed and the redness cleared and  therefore we withdrew the endoscope taking circumferential views of the  remaining gastric and esophageal mucosa getting a better view of the  esophagus.  The patient's vital signs and pulse oximeter remained  stable.  The patient tolerated the procedure well without apparent  complications.   FINDINGS:  Some mild erythematous changes of the  distal esophagus,  probably related to reflux, otherwise unremarkable examination.   PLAN:  Await biopsy report.  The patient will call me for the results  and follow-up with me as an outpatient.           ______________________________  Georgiana Spinner, M.D.     GMO/MEDQ  D:  01/17/2008  T:  01/17/2008  Job:  [id number]

## 2010-07-09 NOTE — Op Note (Signed)
NAMESCHARLENE, CATALINA                   ACCOUNT NO.:  [id number]   MEDICAL RECORD NO.:  [id number]          PATIENT TYPE:  AMB   LOCATION:  ENDO                         FACILITY:  [facility]   PHYSICIAN:  Georgiana Spinner, M.D.    DATE OF BIRTH:  1941-01-20   DATE OF PROCEDURE:  10/19/2006  DATE OF DISCHARGE:                               OPERATIVE REPORT   PROCEDURE:  Colonoscopy.   INDICATIONS:  Rectal bleeding.   ANESTHESIA:  Fentanyl 50 mcg, Versed 4 mg.   DESCRIPTION OF PROCEDURE:  With the patient mildly sedated in the left  lateral decubitus position the Pentax videoscopic colonoscope was  inserted into the rectum and passed under direct vision to the cecum,  identified by ileocecal valve and appendiceal orifice both of which were  photographed.  From this point colonoscope was slowly withdrawn taking  circumferential views of colonic mucosa stopping only in the rectum  which appeared normal on direct, showed hemorrhoids on the retroflexed  view.  The endoscope was straightened and withdrawn.  The patient's  vital signs, and pulse oximeter remained stable.  The patient tolerated  the procedure well without apparent complications.   FINDINGS:  Internal hemorrhoids otherwise an unremarkable examination.   PLAN:  See endoscopy note for further details.           ______________________________  Georgiana Spinner, M.D.     GMO/MEDQ  D:  10/19/2006  T:  10/19/2006  Job:  [id number]

## 2010-07-09 NOTE — Procedures (Signed)
Alexandria Mclaughlin, Alexandria Mclaughlin                   ACCOUNT NO.:  [id number]   MEDICAL RECORD NO.:  [id number]           PATIENT TYPE:   LOCATION:                                 FACILITY:   PHYSICIAN:  Erick Colace, M.D.DATE OF BIRTH:  1940/09/21   DATE OF PROCEDURE:  DATE OF DISCHARGE:                               OPERATIVE REPORT   PROCEDURE:  Right sacroiliac injection.   REQUESTED PROCEDURES:  Right SI joint radiofrequency ablation, however,  because of her L5-S1 fusion.  The access points for the innervation to  the SI joint are partially blocked, therefore less likely to be  effective.   Pain is only partially responsive to medication management and  interferes with self-care mobility.   Informed consent was obtained after describing risks and benefits of the  procedure with the patient.  These include bleeding, bruising, and  infection.  She elects to proceed and has given written consent.  The  patient was placed prone on the fluoroscopy table.  Betadine prep,  sterile drape.  A 25-gauge 1-1/2-inch needle was used to anesthetize the  skin and subcu tissue with 1% lidocaine x2 mL and a  22-gauge, 3-/12-  inch spinal needle was inserted under fluoroscopic guidance starting the  right SI joint.  AP, lateral, and oblique imaging utilized.  Omnipaque  180 under live fluoro demonstrated no intravascular uptake, then a  solution containing 1.5 mL of 40 mg/mL Depo-Medrol and 1 mL of 2%  lidocaine injected.  The patient tolerated the procedure well.  Pre- and  post-injection vitals were stable.  Post-injection instructions were  given.      Erick Colace, M.D.  Electronically Signed     AEK/MEDQ  D:  04/20/2008 19:14:78  T:  04/20/2008 29:56:21  Job:  [id number]

## 2010-07-09 NOTE — Assessment & Plan Note (Signed)
Alexandria Mclaughlin is back regarding her low back pain.  I last saw her on November 22, 2007, and she was doing quite well.  Therapy seemed to benefit her  as well, but over the last month or so, with a change in weather, she  notes increasing pain.  She also reports increasing depression and  anxiety at this time of the year as her husband passed away at this time  a few years ago.  She rates the pain at 6-8/10 now; describes as sharp,  stabbing, aching.  It also will keep her up at night and it flares  certain parts of the day.  Sometimes, she is rather incapacitated from a  movement standpoint.  The pain can increase with activity as well as  prolonged sitting.  She denies pain into the legs today and pain remains  in the low back and buttock areas on the right.  She can walk 30 minutes  without having to stop.  She is using the Percocet for breakthrough pain  1 q.6 h. p.r.n. 10/325 dose.   REVIEW OF SYSTEMS:  Notable for depression, anxiety as mentioned above,  reflux, poor appetite.  Other items are in the written health and  history section.   SOCIAL HISTORY:  The patient is widowed and living alone.   PHYSICAL EXAMINATION:  VITAL SIGNS:  Blood pressure is 142/73, pulse is  70, respiratory rate 18, and she is sating 97% on room air.  GENERAL:  The patient is pleasant, a bit anxious, but overall  appropriate.  She bends at the waist to about 90 degrees with some pain.  She had pain with extension at about 50 degrees.  She had pain with  palpation of the right PSIS areas and compression test today.  Luisa Hart  test is positive.  Strength remains 5/5 with normal sensory exam today.  Posture is fairly good.  Weight appears stable.  HEART:  Regular.  CHEST:  Clear.  ABDOMEN:  Soft and nontender.   ASSESSMENT:  1. Postlaminectomy syndrome, L5-S1.  2. Left leg-length discrepancy.  3. Right sacroiliac joint syndrome.  4. Anxiety with depression.   PLAN:  1. We will begin trial of Lexapro 10  mg at bedtime for mood control.      There is no coincidence that her mood and pain have suffered over      the last few weeks time.  2. We will set the patient up for repeat right SI joint injection with      Dr. Wynn Banker.  Consider RF.  3. Begin trial of fentanyl patch 12 mcg q.72 h., decreased pain here      on a more continual basis.  It is my hope that we could avoid this      depending on response to SI joint intervention.  4. Continue Neurontin and Mobic as well as Percocet for breakthrough      symptoms.  5. I will see her back pending Dr. Lunette Stands treatments.      Ranelle Oyster, M.D.  Electronically Signed    ZTS/MedQ  D:  02/11/2008 10:10:02  T:  02/12/2008 01:54:28  Job #:  [id number]   cc:   Kerrin Champagne, M.D.  Fax: [phone]

## 2010-07-09 NOTE — Assessment & Plan Note (Signed)
Alexandria Mclaughlin is back for her low back and gluteal pain.  We performed a  trigger point injection at last visit into the right piriformis, last  gluteus medius muscles and the patient noted positive response, although  the response only lasted for a day. She had similar effects with  therapy, and presents with pain, persistent in the area today.  She  rates it as a 7/10.  She is very anxious and nervous about a trip that  she has scheduled to [place] with her church which she feels will be  undermined by this problem.   She describes her pain as constant and sharp.  Her pain interferes with  general activity, relations to others, and enjoyment of life on a mild-  to-moderate level.  Sleep is fair.  She does not have a lot of problems  lying or sitting, although if she sits too long it will be problematic.   REVIEW OF SYSTEMS:  Notable for anxiety.  Other pertinent positives are  as above.   SOCIAL HISTORY:  As noted above.  She is widowed.   PHYSICAL EXAMINATION:  Blood pressure is 141/81, pulse 79, respiratory  rate 18.  She is saturating 95% on room air.  The patient is pleasant, alter and oriented x3.  She seems to have some asymmetries in the pelvis with the right elevated  over the left.  She has particular tenderness, once again, in the right  piriformis area, just lateral to the sacrum, as well as the gluteus  medius and somewhat minimus muscles.  Pyriformis stretching was positive  for pain.  I could palpate the muscles today with deep palpation.  HEART:  Regular.  CHEST:  Clear.  ABDOMEN:  Soft, nontender.  EXTREMITIES:  5/5 for strength and normal sensation was noted.  Laminectomy scar was seen in the lower lumbar spine.   ASSESSMENT:  1. History of L5-S1 laminectomy infusion.  2. Persistent buttock pain consistent with myofascial piriformis      dysfunction at its origin/tenosynovitis.   PLAN:  After informed consent, we injected the piriformis muscle at its  origin, at the  sacrum, using 40 mg of Kenalog and 3 mL of 1% lidocaine.  The patient tolerated these well.   MEDICATIONS:  1. We will change his break through pain medicine from Vicodin to      Percocet 7.5/375 one q.6 h. p.r.n. #100.  2. Robaxin for spasm as this is on her drug formulary.  3. Continue stretching.  4. The patient needs to look at a heel insert as advised last visit.  5. I will see her back in about a month.      Ranelle Oyster, M.D.  Electronically Signed     ZTS/MedQ  D:  04/05/2007 16:54:35  T:  04/06/2007 16:06:48  Job #:  [id number]   cc:   Kerrin Champagne, M.D.  Fax: [phone]

## 2010-07-09 NOTE — Assessment & Plan Note (Signed)
Alexandria Mclaughlin is back regarding her right low back pain.  She had second right  SI joint injection on October 18, 2007, initially had pain for the first  day or 2, but then afterwards her pain has improved substantially.  The  pain is 4-5/10, and she notes substantial improvement in her quality of  life.  She still reports the pain is sharp and stabbing in the right low  back/buttock area.  It seems to be worse with prolonged standing and  sitting.  Pain interferes with general activity, in relation with  others, and enjoyment of life on a mild level only  at this point.   REVIEW OF SYSTEMS:  Notable for depression and anxiety.  She has had  some cataract surgery recently on the left and has right eye surgery  scheduled for next month.  The left eye has done very well so far.  Full  review is written out in the history section.   SOCIAL HISTORY:  The patient is widowed and living alone currently.   PHYSICAL EXAMINATION:  VITAL SIGNS:  Blood pressure is 109/58, pulse is  76, respiratory rate 18, and she is saturating 99% on room air.  GENERAL:  The patient is pleasant, alert, and oriented x3.  She walks  with a normal gait.  She is able to bend and touch her toes with minimal  discomfort.  She had mild pain with right PSIS area as well as the right  piriformis region still.  Extension caused little pain today.  She had  mild discomfort with rotation, lateral bend in the each side.  Strength  is 5/5.  Sensory exam is intact.  HEART:  Regular.  CHEST:  Clear.  ABDOMEN:  Soft and nontender.   ASSESSMENT:  1. Postlaminectomy syndrome at L5-S1.  2. Left leg length discrepancy.  3. Right sacroiliac joint syndrome secondary to postlaminectomy      syndrome at L5-S1.   PLAN:  1. We will send the patient to outpatient physical therapy for an      sacroiliac belt pelvic, lower back, as well as lower extremity      strengthening, posture, and stretching exercises.  She needs to get      on a regular  home exercise program here as well.  2. Continue Percocet for breakthrough pain with Neurontin and Mobic on      board.  3. I will see her back in about 3 months.  I am very pleased with her      progress.      Ranelle Oyster, M.D.  Electronically Signed     ZTS/MedQ  D:  11/22/2007 10:06:55  T:  11/22/2007 23:50:07  Job #:  [id number]   cc:   Kerrin Champagne, M.D.  Fax: [phone]

## 2010-07-12 NOTE — Discharge Summary (Signed)
NAMELAURELLA, Alexandria Mclaughlin                   ACCOUNT NO.:  [id number]   MEDICAL RECORD NO.:  [id number]          PATIENT TYPE:  IPS   LOCATION:  0506                          FACILITY:  [facility]   PHYSICIAN:  Geoffery Lyons, M.D.      DATE OF BIRTH:  April 27, 1940   DATE OF ADMISSION:  03/19/2006  DATE OF DISCHARGE:  03/20/2006                               DISCHARGE SUMMARY   CHIEF COMPLAINT AND PRESENT ILLNESS:  This was the second admission to  [facility] for this 70 year old white female  voluntarily admitted.  She felt like she was going to have a panic  attack, took an extra Xanax, came here for assessment, keep from  getting into a dark pit.  Having problems with uncontrollable crying.  She was having some PTSD symptoms from her husband's suicide the year  before.  Having a difficult time.  She had found her husband at home.  Was unable to stay in the house anymore.  Was endorsing no active  suicidal or homicidal ideation.  No evidence of psychotic symptoms.   PAST PSYCHIATRIC HISTORY:  Was at [facility] a year  prior to this admission with depression following her husband's death.   MEDICAL HISTORY:  Gastroesophageal reflux, hypertension, chronic back  pain.   MEDICATIONS:  Wellbutrin, hydrocodone 7.5 mg every four hours as needed,  Ambien CR at bedtime, 7.5 mg daily as needed.   PHYSICAL EXAMINATION:  Performed and failed to show any acute findings.   LABORATORY DATA:  CBC was within normal limits.  UDS positive for  benzodiazepines and opiates.  Urinalysis with leukocytes.  Glucose 110.  TSH 0.85.   MENTAL STATUS EXAM:  Alert, cooperative female.  Well-groomed, alert.  Speech was clear, normal rate, tempo and production, articulate.  Endorsed anxiety, some depression.  Affect is appropriate.  Thought  processes are clear, relevant, goal-oriented.  No evidence of delusions.  No active suicidal or homicidal ideation.  No hallucinations.  Cognition  was  well-preserved.   ADMISSION DIAGNOSES:  AXIS I:  Depressive disorder not otherwise  specified.  Rule out post-traumatic stress disorder.  AXIS II:  No diagnosis.  AXIS III:  Gastroesophageal reflux, hypertension, chronic back pain.  AXIS IV:  Moderate.  AXIS V:  GAF upon admission 40-45; highest GAF in the last year 70-75.   HOSPITAL COURSE:  She was admitted.  She was started in individual and  group psychotherapy.  We started detox with Librium.  She was placed  back on her hydrocodone.  As already stated, a 70 year old female,  widowed x2.  Second husband committed suicide.  She found him dead,  gunshot wound to the head.  She had some PTSD symptoms that seemed to  have resolved.  Endorsed she was having panic attacks.  Endorsed the  anxiety builds up.  Concerned that she will go into a deep black hole,  starts crying, cannot control it.  Usually takes Xanax 0.5 mg x1 and, if  it does not help and she gets more upset, she might take another one.  Endorsed also that she drank trying to get the anxiety under control and  blacked out.  Endorsed that she would never do anything like this again.  It was a very bad experience.  Claims no suicidal ideation.  Felt  suicidal shortly after the husband died.  Wanted to be discharged.  Trying to get to the grandchild's birthday party.  Also wanted to go  through some medication changes due to the copay.  Wanted to go from the  Effexor to Wellbutrin.  Endorsed that life was better now that she moved  to [place] to be closer to her daughter after husband killed himself.  She is involved in church, family and has a boyfriend who is 39 years  old.  Endorsed feeling so much better.  Endorsed she was ready to go  home.  Endorsed no suicidal or homicidal ideation.   DISCHARGE DIAGNOSES:  AXIS I:  Major depression, recurrent.  Anxiety  disorder not otherwise specified.  Panic and post-traumatic stress  disorder symptoms.  AXIS II:  No diagnosis.   AXIS III:  Gastroesophageal reflux, arterial hypertension, chronic back  pain.  AXIS IV:  Moderate.  AXIS V:  GAF upon discharge 50-55.   DISCHARGE MEDICATIONS:  Xanax 0.5 mg every six hours as needed for panic  attacks.   Recommended to consider Zoloft if the Wellbutrin is not effective.   FOLLOWUP:  Was going to follow up with her counselor, Sherron Monday, and Dr.  Jules Schick.      Geoffery Lyons, M.D.  Electronically Signed     IL/MEDQ  D:  04/13/2006  T:  04/14/2006  Job:  [id number]

## 2010-07-12 NOTE — H&P (Signed)
[name], Mclaughlin                   ACCOUNT NO.:  [id number]   MEDICAL RECORD NO.:  [id number]          PATIENT TYPE:  IPS   LOCATION:  0506                          FACILITY:  [facility]   PHYSICIAN:  Alexandria Mclaughlin, M.D.      DATE OF BIRTH:  1940/10/17   DATE OF ADMISSION:  03/19/2006  DATE OF DISCHARGE:  03/20/2006                       PSYCHIATRIC ADMISSION ASSESSMENT   This is a 70 year old Caucasian female is voluntary admit on 03/19/06.  The patient states that yesterday she felt like she was going to have a  panic attack, took an extra Xanax and came here for assessment to keep  from getting into a dark pit.  She was having problems with  uncontrolled crying.  States that she was having some PTSD symptoms from  her husband's suicide of last year.  She has been having a difficult  time.  She, herself, had found her husband at home and was unable to  stay in the house anymore.  She was denying any suicidal thoughts or  homicidal ideation or psychotic symptoms.   PAST PSYCHIATRIC HISTORY:  The patient was inpatient at [facility] approximately a year ago with depression following her  husband's death.  She has a boyfriend, 2 children, 4 grandchildren. She  is currently in a grief program at church.   FAMILY HISTORY:  Denies.  __________   SOCIAL HISTORY:  There is a past history of alcohol use.  States  currently she is now a social drinker drinking a glass of wine with her  last drink about 3 months ago.  Her psychiatrist is Dr. Waverly Ferrari.  She  also has a therapist, Sherron Monday.   MEDICAL HISTORY:  GERD, hypertension and chronic back pain.   MEDICATIONS:  Wellbutrin. Unknown amount at this time, taken daily.  Hydrocodone 7.5 q. 4 h. for her back pain.  Ambien CR at bed time and  Xanax 0.5 mg p.o. p.r.n. The patient gets her medications at [facility] on  [address].  There is also report of patient taking other blood  pressure medicines but at this time medication is  unknown.   ALLERGIES:  No known drug allergies.   REVIEW OF SYSTEMS:  There is no fever, no night sweats, no weight loss  or weight gain.  No chest pain, no shortness of breath, no hematuria, no  frequency, no polyuria, no diarrhea nor constipation.  Some visual  difficulties.  The patient does wear glasses, positive for lower back  pain.  Positive for depression, positive for anxiety.  Temperature is  98.3, 85 heart rate, 20 respirations, blood pressure 169/95.  She is a  141 pounds.   PHYSICAL EXAMINATION:  GENERAL:  The patient is alert and oriented x3.  She is well-groomed.  No acute distress.  Negative lymphadenopathy.  She  has full range of motion. No carotid bruits.  CHEST:  Breath sounds are clear.  Equal lung expansion.  Breast exam is  deferred.  Heart rate, S1/S2 regular rate and rhythm, no murmurs,  gallops or rubs.  ABDOMEN:  Nontender, nondistended.  Positive  bowel sounds.  GU:  Exam deferred.  EXTREMITIES:  The patient has full range of motion.  Muscle strength is  5+ against resistance.  SKIN:  Warm and dry with good turgor.  NEUROLOGICAL FINDINGS:  Cranial nerves II-XII are intact.  Good grip  strength, cerebellar function is intact.  Negative Romberg.   LABORATORY DATA:  Her CBC is within normal limits, urine drug screen  positive for benzo's, positive for opiates.  Urinalysis shows large  leukocytes with 11-12 WBCs.  Her glucose is 110, her TSH is 0.85.   MENTAL STATUS EXAM:  She is alert and oriented x3.  Well-groomed, alert  and cooperative.  Speech is clear.  Good articulation.  Normal base and  tone.  The patient's mood, some mild depression.  The patient's affect  is appropriate to situation.  Thought processes are coherent, goal  directed, no evidence of psychosis, cognition and memory are intact,  good concentration.  Insight is good.  Fair judgment.   AXIS I:  Depressive disorder NOS.  AXIS II:  Deferred.  AXIS III:  GERD, hypertension, chronic back  pain.  AXIS IV:  Psychosocial problems related to grief, medical problems.  AXIS V:  50.   PLAN:  The patient is to be discharged today. The patient is in full  content reality.  Denied any suicidal ideation.  There will be no change  in her medication.  The patient was assessed in regards to any urinary  tract symptoms.  The patient denies any dysuria, pressure, burning.  The  patient will receive a copy of her labs and to follow-up if any symptoms  began.  She was instructed also to drink plenty of fluids and again  contact primary care [name] if the patient develops any urinary tract  infection symptoms.  The patient was seemed to understand instructions.  The patient is to follow-up with Merlyn Albert May.  She has an appointment on  Tuesday, 29th and has an appointment with Dr. Waverly Ferrari on Wednesday,  04/01/06.  The patient was advised not to drink any alcohol.  The  patient again was discharged on 03/20/06.      Alexandria Mclaughlin, N.P.      Alexandria Mclaughlin, M.D.  Electronically Signed    JO/MEDQ  D:  03/20/2006  T:  03/21/2006  Job:  [id number]

## 2010-07-23 ENCOUNTER — Encounter: Payer: Medicare Other | Attending: Physical Medicine & Rehabilitation | Admitting: Physical Medicine & Rehabilitation

## 2010-07-23 DIAGNOSIS — F341 Dysthymic disorder: Secondary | ICD-10-CM | POA: Insufficient documentation

## 2010-07-23 DIAGNOSIS — M533 Sacrococcygeal disorders, not elsewhere classified: Secondary | ICD-10-CM

## 2010-07-23 DIAGNOSIS — M545 Low back pain, unspecified: Secondary | ICD-10-CM | POA: Insufficient documentation

## 2010-07-23 DIAGNOSIS — M961 Postlaminectomy syndrome, not elsewhere classified: Secondary | ICD-10-CM

## 2010-07-23 DIAGNOSIS — IMO0001 Reserved for inherently not codable concepts without codable children: Secondary | ICD-10-CM | POA: Insufficient documentation

## 2010-07-23 DIAGNOSIS — M2170 Unequal limb length (acquired), unspecified site: Secondary | ICD-10-CM | POA: Insufficient documentation

## 2010-07-23 DIAGNOSIS — Z981 Arthrodesis status: Secondary | ICD-10-CM | POA: Insufficient documentation

## 2010-07-23 DIAGNOSIS — IMO0002 Reserved for concepts with insufficient information to code with codable children: Secondary | ICD-10-CM

## 2010-07-24 NOTE — Assessment & Plan Note (Signed)
Alexandria Mclaughlin is back regarding her chronic back pain and buttock pain.  She feels that the Lyrica and Pamelor were helpful for her pain overall. Her pain is down to 4/10 today.  Pain is sharp and stabbing still. Sleep is good.  She got a new bed which is helping her also there.  She is having no ill effects from the new medications.  REVIEW OF SYSTEMS:  Notable for the above.  Full 12-point review is in the written health and history section of the chart.  SOCIAL HISTORY:  Unchanged.  PHYSICAL EXAMINATION:  VITAL SIGNS:  Blood pressure is 137/84, pulse 78, respiratory rate 18, she is satting 100% on room air. GENERAL:  The patient is pleasant and alert.  She has some tenderness still on low back and right piriformis area.  Strength is 5/5 and normal sensory exam.  Neurologically, she is normal.  ASSESSMENT: 1. Persistent low back and right buttock pain, status post laminectomy     and fusion. 2. Left leg length discrepancy. 3. Reactive depression. 4. Centralization of pain.  PLAN: 1. She has had good results with Lyrica so far.  We will titrate to     100 t.i.d.  Increase Pamelor to 20 mg at night consistently. 2. We will stay with the fentanyl patch and Percocet as previously     written. 3. I will see her back in 3 months.  She will see my nurse     practitioner next month.  I did ask her to stay on her Neurontin in     addition to Lyrica for now.     Ranelle Oyster, M.D. Electronically Signed    ZTS/MedQ D:  07/23/2010 11:44:42  T:  07/24/2010 00:58:48  Job #:  [id number]  cc:   Kerrin Champagne, M.D. Fax: [phone]  Georgianne Fick, M.D. Fax: [phone]

## 2010-08-21 ENCOUNTER — Encounter: Payer: Medicare Other | Attending: Physical Medicine & Rehabilitation | Admitting: Neurosurgery

## 2010-08-21 DIAGNOSIS — IMO0001 Reserved for inherently not codable concepts without codable children: Secondary | ICD-10-CM | POA: Insufficient documentation

## 2010-08-21 DIAGNOSIS — M2170 Unequal limb length (acquired), unspecified site: Secondary | ICD-10-CM | POA: Insufficient documentation

## 2010-08-21 DIAGNOSIS — F3289 Other specified depressive episodes: Secondary | ICD-10-CM

## 2010-08-21 DIAGNOSIS — M545 Low back pain, unspecified: Secondary | ICD-10-CM | POA: Insufficient documentation

## 2010-08-21 DIAGNOSIS — F329 Major depressive disorder, single episode, unspecified: Secondary | ICD-10-CM

## 2010-08-21 DIAGNOSIS — M5430 Sciatica, unspecified side: Secondary | ICD-10-CM

## 2010-08-21 DIAGNOSIS — F341 Dysthymic disorder: Secondary | ICD-10-CM | POA: Insufficient documentation

## 2010-08-22 NOTE — Assessment & Plan Note (Signed)
Alexandria Mclaughlin is patient of Dr. Riley Kill who is seen for chronic back and buttock pain.  She states that Lyrica is helping since she has increase it and she feels like she is doing much better.  Her pain is about 5 today.  It is intermittent stabbing pain.  Activity level is about 5/6. Pain is worse in the evening.  Sleep patterns are good.  Pain is worse with sitting and activity.  Heat and medication TENS unit tends to help.  REVIEW OF SYSTEMS:  Notable for difficulties described above, otherwise within normal limits.  PHYSICAL EXAMINATION:  Her blood pressure is 149/81, pulse 86, respiration 12, O2 sats 99 on room air.  Motor strength is good. Sensation is intact.  Constitutionally, she is within normal limits. She is alert and oriented x3.  Her gait is normal.  ASSESSMENT: 1. Persistent low back pain, right buttock pain. 2. Post laminectomy syndrome. 3. Left leg discrepancy. 4. Reactive depression.  PLAN: 1. She will continue her current medication regimen, refilled her     fentanyl transdermal 75 mcg 1 every 72 hours, 10 with no refill. 2. Percocet 10/325, 1 p.o. q.6 h. p.r.n. pain, 120 with no refill. 3. She will follow up with me in 1 month.  Her questions were     encouraged and answered.     Luciann Gossett L. Blima Dessert Electronically Signed    RLW/MedQ D:  08/21/2010 13:53:27  T:  08/22/2010 03:12:52  Job #:  [id number]

## 2010-09-23 ENCOUNTER — Ambulatory Visit: Payer: Medicare Other | Admitting: Physical Medicine and Rehabilitation

## 2010-09-24 ENCOUNTER — Encounter: Payer: Medicare Other | Attending: Physical Medicine & Rehabilitation | Admitting: Physical Medicine & Rehabilitation

## 2010-09-24 DIAGNOSIS — F329 Major depressive disorder, single episode, unspecified: Secondary | ICD-10-CM

## 2010-09-24 DIAGNOSIS — M79609 Pain in unspecified limb: Secondary | ICD-10-CM | POA: Insufficient documentation

## 2010-09-24 DIAGNOSIS — M533 Sacrococcygeal disorders, not elsewhere classified: Secondary | ICD-10-CM

## 2010-09-24 DIAGNOSIS — M2170 Unequal limb length (acquired), unspecified site: Secondary | ICD-10-CM | POA: Insufficient documentation

## 2010-09-24 DIAGNOSIS — M961 Postlaminectomy syndrome, not elsewhere classified: Secondary | ICD-10-CM | POA: Insufficient documentation

## 2010-09-24 DIAGNOSIS — IMO0001 Reserved for inherently not codable concepts without codable children: Secondary | ICD-10-CM | POA: Insufficient documentation

## 2010-09-24 DIAGNOSIS — M5430 Sciatica, unspecified side: Secondary | ICD-10-CM

## 2010-09-24 DIAGNOSIS — F3289 Other specified depressive episodes: Secondary | ICD-10-CM

## 2010-09-24 NOTE — Assessment & Plan Note (Signed)
Alexandria Mclaughlin is back regarding her buttocks and leg pain.  She is doing fairly well with her Lyrica and Pamelor titrations.  Her pain is around 4/10 and she feels that her quality of life is improved and she is able to get around much more not fixating constantly on her pain.  She still has some symptoms and has some bad days occasionally associated with damp weather.  REVIEW OF SYSTEMS:  Notable for the above.  Full 12-point review is in the written health and history section of the chart.  SOCIAL HISTORY:  Unchanged.  She recently came back from a trip from [place] and plans to go to [place] this fall.  PHYSICAL EXAMINATION:  VITAL SIGNS:  Blood pressure is 119/70, pulse 60, respiratory rate 14 and she is satting 97% on room air. GENERAL:  The patient is pleasant and alert. EXTREMITIES:  She seems to stand well and has good weightbearing.  She still has some pain in the right PSIS region down to the piriformis area.  Strength is 5/5 and sensory is intact.  ASSESSMENT: 1. Persistent low back/right buttock pain. 2. Post laminectomy syndrome. 3. Left leg length discrepancy. 4. Centralization of pain.  PLAN: 1. Continue with fentanyl patch 75 mics q.72 h. #10. 2. I refilled Percocet 10/325 #120. 3. We will stay with Lyrica and Pamelor at current doses which seemed     to be working for her. 4. I will see her back in 6 months with my nurse practitioner.  I will     see her back in a month.     Ranelle Oyster, M.D. Electronically Signed    ZTS/MedQ D:  09/24/2010 13:26:26  T:  09/24/2010 22:57:55  Job #:  [id number]  cc:   Georgianne Fick, M.D. Fax: [phone]

## 2010-10-22 ENCOUNTER — Encounter: Payer: Medicare Other | Attending: Neurosurgery | Admitting: Neurosurgery

## 2010-10-22 DIAGNOSIS — IMO0001 Reserved for inherently not codable concepts without codable children: Secondary | ICD-10-CM | POA: Insufficient documentation

## 2010-10-22 DIAGNOSIS — M961 Postlaminectomy syndrome, not elsewhere classified: Secondary | ICD-10-CM

## 2010-10-22 DIAGNOSIS — M2170 Unequal limb length (acquired), unspecified site: Secondary | ICD-10-CM | POA: Insufficient documentation

## 2010-10-22 DIAGNOSIS — M79609 Pain in unspecified limb: Secondary | ICD-10-CM | POA: Insufficient documentation

## 2010-10-22 DIAGNOSIS — M5430 Sciatica, unspecified side: Secondary | ICD-10-CM

## 2010-10-22 NOTE — Assessment & Plan Note (Signed)
ACCOUNT:  [id number].  This is a patient of Dr. Rosalyn Charters seen for her buttock and leg pain. She states that her pain is unchanged mostly in the right side.  She rates it at a 3-4.  It is a sharp and intermittent pain.  General activity level is 2-3.  Pain is worse during the evening.  Sleep patterns are good.  Pain is worse with sitting and activity.  Therapy and medication, TENS unit tends to help.  She can walk up to 30 minutes at a time.  She is independent with ambulation.  Functionally, she is good.  REVIEW OF SYSTEMS:  Notable for difficulties described above, otherwise within normal limits.  Her Oswestry score is 24.  PAST MEDICAL HISTORY:  Unchanged.  SOCIAL HISTORY:  Unchanged.  FAMILY HISTORY:  Unchanged.  PHYSICAL EXAMINATION:  Her blood pressure 102/90, pulse 60, respirations 18, O2 sats 100 on room air.  Motor strength is 5/5 in the lower extremities including iliopsoas, quadriceps.  Sensation is intact. Constitutionally, she is within normal limits.  She is alert and oriented x3.  ASSESSMENT: 1. Persistent right buttock pain. 2. Post laminectomy syndrome. 3. Left leg discrepancy.  PLAN: 1. Continue fentanyl refill 75 transdermal every 72 hours, 10 with no     refill.  She will continue with her other medicines as prescribed.     No changes today.  No other prescriptions were given. 2. Her questions were encouraged and answered.  We will see her back     here in 1 month.     Russell L. Blima Dessert Electronically Signed    RLW/MedQ D:  10/22/2010 11:20:37  T:  10/22/2010 15:31:06  Job #:  [id number]

## 2010-11-20 ENCOUNTER — Encounter: Payer: Medicare Other | Attending: Neurosurgery | Admitting: Neurosurgery

## 2010-11-20 DIAGNOSIS — IMO0001 Reserved for inherently not codable concepts without codable children: Secondary | ICD-10-CM | POA: Insufficient documentation

## 2010-11-20 DIAGNOSIS — M2170 Unequal limb length (acquired), unspecified site: Secondary | ICD-10-CM

## 2010-11-20 DIAGNOSIS — M961 Postlaminectomy syndrome, not elsewhere classified: Secondary | ICD-10-CM

## 2010-11-20 NOTE — Assessment & Plan Note (Signed)
This is a patient Dr. Rosalyn Charters who is seen for buttock and right leg pain.  She states her pain is unchanged and actually somewhat better. She rates it as a 3.  It is intermittent, stabbing pain.  General activity level is 2-5.  Pain is worse in the evening.  Sitting, activity, and standing aggravate.  Medication tends to help.  She walks without assistance.  REVIEW OF SYSTEMS:  Notable for difficulties described above, otherwise within normal limits.  PAST MEDICAL HISTORY, SOCIAL HISTORY, AND FAMILY HISTORY:  Unchanged.  PHYSICAL EXAMINATION:  Her blood pressure is 128/64, pulse 62, respirations 16, and O2 sats 98 on room air.  Her motor strength is 5/5 in the lower extremity.  Constitutionally, she is thin.  She is alert and oriented x3.  She has a normal gait.  Her sensation is intact.  ASSESSMENT: 1. Persistent right buttock pain with postlaminectomy syndrome. 2. Left leg discrepancy.  PLAN: 1. Refill fentanyl 75 mcg 1 transdermally every 72 hours, #10 with no     refill. 2. Percocet 10/325 one p.o. q.6 h. p.r.n., #120 with no refill. 3. Her questions were encouraged and answered.  We will see her back     here in 1 month.     Avalene Sealy L. Blima Dessert Electronically Signed    RLW/MedQ D:  11/20/2010 11:16:46  T:  11/20/2010 12:36:57  Job #:  [id number]

## 2010-12-23 ENCOUNTER — Encounter: Payer: Medicare Other | Attending: Neurosurgery | Admitting: Neurosurgery

## 2010-12-23 DIAGNOSIS — M961 Postlaminectomy syndrome, not elsewhere classified: Secondary | ICD-10-CM

## 2010-12-23 DIAGNOSIS — M2170 Unequal limb length (acquired), unspecified site: Secondary | ICD-10-CM

## 2010-12-23 DIAGNOSIS — IMO0001 Reserved for inherently not codable concepts without codable children: Secondary | ICD-10-CM | POA: Insufficient documentation

## 2010-12-23 DIAGNOSIS — M549 Dorsalgia, unspecified: Secondary | ICD-10-CM | POA: Insufficient documentation

## 2010-12-24 NOTE — Assessment & Plan Note (Signed)
This is a patient of Dr. Riley Kill seen for right buttock and right leg pain.  She does have a migraine today, but reports her back pain unchanged.  It is 3-4.  It is a sharp, stabbing, constant pain.  General activity level is 4-5.  Pain is worse during the day.  Sitting activity and standing aggravate.  Therapy and medication tends to help.  She walks without assistance.  Functionally, she does not work.  REVIEW OF SYSTEMS:  Notable for difficulties described above, otherwise within normal limits.  Past medical history, social history, family history unchanged.  PHYSICAL EXAMINATION:  Blood pressure is 135/86, pulse 87, respirations 18, O2 sats 94 on room air.  Motor strength and sensation are intact. Constitutionally, she is within normal limits.  She is alert and oriented x3.  Again, she does have somewhat of a headache persistent.  ASSESSMENT: 1. Right buttock pain post laminectomy syndrome. 2. Left leg discrepancy.  PLAN: 1. Refill Percocet 10/325 one p.o. q.6 hours p.r.n., 120 with no     refill. 2. Fentanyl 75 mcg 1 transdermally every 72 hours, 10 with no refill. 3. Her questions were encouraged and answered.  We will see her in a     month.     Alexandria Mclaughlin L. Blima Dessert Electronically Signed    RLW/MedQ D:  12/23/2010 13:30:27  T:  12/24/2010 00:39:01  Job #:  [id number]

## 2011-01-22 ENCOUNTER — Encounter: Payer: Medicare Other | Attending: Neurosurgery | Admitting: Neurosurgery

## 2011-01-22 DIAGNOSIS — M79609 Pain in unspecified limb: Secondary | ICD-10-CM | POA: Insufficient documentation

## 2011-01-22 DIAGNOSIS — M2170 Unequal limb length (acquired), unspecified site: Secondary | ICD-10-CM | POA: Insufficient documentation

## 2011-01-22 DIAGNOSIS — M961 Postlaminectomy syndrome, not elsewhere classified: Secondary | ICD-10-CM

## 2011-01-22 DIAGNOSIS — IMO0001 Reserved for inherently not codable concepts without codable children: Secondary | ICD-10-CM | POA: Insufficient documentation

## 2011-01-22 NOTE — Assessment & Plan Note (Signed)
This is a patient of Dr. Rosalyn Charters, seen for right buttock and right leg pain.  She reports no change in her pain.  She rates it a 4.  It is a sharp, stabbing pain.  General activity level is 2-4.  Pain is worse during the day.  Sleep pattern is not indicated.  Pain is worse with sitting and standing.  Medication does help.  She walks independently. Functionally, she is independent.  REVIEW OF SYSTEMS:  Notable for difficulties described above, otherwise within normal limits.  PAST MEDICAL HISTORY:  Unchanged.  SOCIAL HISTORY:  Unchanged.  FAMILY HISTORY:  Unchanged.  PHYSICAL EXAMINATION:  VITAL SIGNS:  Her blood pressure is 143/80, pulse 85, respirations 16, O2 sats 99 on room air. NEUROLOGIC:  She is alert and oriented x3.  Her affect is bright.  She has a normal gait.  Strength and sensation are intact.  ASSESSMENT: 1. Right buttock pain post laminectomy syndrome. 2. Left leg discrepancy.  PLAN: 1. Refill fentanyl 75 mcg 1 transdermally every 72 hours, 10 with no     refill. 2. Percocet 10/325 one p.o. q.6 h. p.r.n., 120 with no refill.  Her     questions were encouraged and answered.  I will see her in a month.     Barrett Goldie L. Blima Dessert Electronically Signed    RLW/MedQ D:  01/22/2011 13:28:03  T:  01/22/2011 22:59:58  Job #:  [id number]

## 2011-02-20 ENCOUNTER — Encounter: Payer: Medicare Other | Attending: Neurosurgery | Admitting: Neurosurgery

## 2011-02-20 DIAGNOSIS — G894 Chronic pain syndrome: Secondary | ICD-10-CM

## 2011-02-20 DIAGNOSIS — M961 Postlaminectomy syndrome, not elsewhere classified: Secondary | ICD-10-CM

## 2011-02-20 DIAGNOSIS — M2170 Unequal limb length (acquired), unspecified site: Secondary | ICD-10-CM | POA: Insufficient documentation

## 2011-02-20 DIAGNOSIS — IMO0001 Reserved for inherently not codable concepts without codable children: Secondary | ICD-10-CM | POA: Insufficient documentation

## 2011-02-21 NOTE — Assessment & Plan Note (Signed)
This patient of Dr. Riley Kill seen for right buttock and leg pain.  She reports no change in her pain.  It is a 4.  It is a sharp, stabbing and aching pain.  The general activity level is 3-4.  Pain is worse during the day.  Sleep patterns are good.  Sitting, inactivity and standing aggravate.  Medication helps.  Mobility, she is independent. Functionally, she is independent.  REVIEW OF SYSTEMS:  Notable for the difficulties described above, otherwise unremarkable.  PAST MEDICAL HISTORY, SOCIAL HISTORY AND FAMILY HISTORY:  Unchanged.  PHYSICAL EXAMINATION:  VITAL SIGNS:  Her blood pressure is 140/84, pulse 66, respirations 16 and O2 sats 97 on room air.  EXTREMITIES:  Motor strength and sensation are intact. NEUROLOGIC:  Constitutionally, she is within normal limits.  She is alert and oriented x3.  She has a normal gait.  ASSESSMENT: 1. Right buttock pain post laminectomy syndrome. 2. Left leg discrepancy.  PLAN: 1. Refill of fentanyl 75 mcg once transdermally every 72 hours 10 with     no refill 2. Percocet 10/325 one p.o. q.6 h. p.r.n., 120 with no refill.  Her     questions were encouraged and answered.  We will see her in a     month.     Shaw Dobek L. Blima Dessert Electronically Signed    RLW/MedQ D:  02/20/2011 15:06:07  T:  02/21/2011 06:55:31  Job #:  [id number]

## 2011-03-19 ENCOUNTER — Ambulatory Visit: Payer: Medicare Other | Admitting: Physical Medicine & Rehabilitation

## 2011-03-19 ENCOUNTER — Encounter: Payer: Medicare Other | Attending: Neurosurgery | Admitting: Neurosurgery

## 2011-03-19 DIAGNOSIS — M2170 Unequal limb length (acquired), unspecified site: Secondary | ICD-10-CM

## 2011-03-19 DIAGNOSIS — M961 Postlaminectomy syndrome, not elsewhere classified: Secondary | ICD-10-CM

## 2011-03-19 DIAGNOSIS — IMO0001 Reserved for inherently not codable concepts without codable children: Secondary | ICD-10-CM | POA: Insufficient documentation

## 2011-03-19 DIAGNOSIS — M79609 Pain in unspecified limb: Secondary | ICD-10-CM | POA: Insufficient documentation

## 2011-03-20 NOTE — Assessment & Plan Note (Signed)
This patient of Dr. Riley Kill seen for right buttock and leg pain.  She reports no change in her pain at a 4 to intermittent and stabbing pain. General activity level is a 3.  Pain is worse during the day.  Sleep patterns are good.  Sitting and activity and standing aggravate. Medication helps.  She walks independently.  Functionally, she is unemployed.  REVIEW OF SYSTEMS:  Notable for the difficulties described above, otherwise unremarkable.  Last pill count and UDS consistent.  PAST MEDICAL HISTORY, SOCIAL HISTORY AND FAMILY HISTORY:  Unchanged.  PHYSICAL EXAMINATION:  VITAL SIGNS:  Blood pressure is 121/45, pulse 69, respirations 16 and O2 sats 98 on room air. EXTREMITIES:  Motor strength and sensation are intact. NEUROLOGIC:  Constitutionally, she is within normal limits.  She is alert and oriented x3.  She has a slight limp.  ASSESSMENT: 1. Right buttock pain and post laminectomy syndrome. 2. Left leg discrepancy.  PLAN: 1. I refilled Lyrica 100 mg 1 p.o. t.i.d. 90 with 5 refills. 2. Percocet 10/325 one p.o. q.6 h. p.r.n., 120 with no refill. 3. Fentanyl 75 mcg 1 transdermally every 72 h., 10 with no refills.     Her questions were encouraged and answered.  She will follow up in     a month.     Alexandria Mclaughlin L. Blima Dessert Electronically Signed    RLW/MedQ D:  03/19/2011 13:29:47  T:  03/20/2011 03:32:59  Job #:  [id number]

## 2011-04-16 ENCOUNTER — Encounter: Payer: Medicare Other | Attending: Physical Medicine & Rehabilitation | Admitting: *Deleted

## 2011-04-16 ENCOUNTER — Encounter: Payer: Self-pay | Admitting: *Deleted

## 2011-04-16 VITALS — BP 155/65 | HR 72 | Resp 18 | Ht 63.0 in | Wt 133.0 lb

## 2011-04-16 DIAGNOSIS — M961 Postlaminectomy syndrome, not elsewhere classified: Secondary | ICD-10-CM

## 2011-04-16 DIAGNOSIS — M79609 Pain in unspecified limb: Secondary | ICD-10-CM | POA: Insufficient documentation

## 2011-04-16 DIAGNOSIS — M2170 Unequal limb length (acquired), unspecified site: Secondary | ICD-10-CM | POA: Insufficient documentation

## 2011-04-16 DIAGNOSIS — M5430 Sciatica, unspecified side: Secondary | ICD-10-CM

## 2011-04-16 DIAGNOSIS — IMO0001 Reserved for inherently not codable concepts without codable children: Secondary | ICD-10-CM | POA: Insufficient documentation

## 2011-04-16 MED ORDER — OXYCODONE-ACETAMINOPHEN 10-325 MG PO TABS: 1.0000 | ORAL_TABLET | Freq: Four times a day (QID) | ORAL | Status: DC | PRN

## 2011-04-16 MED ORDER — FENTANYL 75 MCG/HR TD PT72: 1.0000 | MEDICATED_PATCH | TRANSDERMAL | Status: DC

## 2011-04-16 NOTE — Progress Notes (Signed)
Reports no changes in pain complaint. Denies recent falls.

## 2011-04-16 NOTE — Progress Notes (Signed)
Addended by: Leonie Douglas A on: 04/16/2011 02:45 PM   Modules accepted: Orders

## 2011-04-17 MED ORDER — OXYCODONE-ACETAMINOPHEN 10-325 MG PO TABS: 1.0000 | ORAL_TABLET | Freq: Four times a day (QID) | ORAL | Status: DC | PRN

## 2011-04-17 NOTE — Progress Notes (Signed)
Addended by: Leonie Douglas A on: 04/17/2011 08:24 AM   Modules accepted: Orders

## 2011-05-20 ENCOUNTER — Ambulatory Visit: Payer: Medicare Other | Admitting: Physical Medicine & Rehabilitation

## 2011-05-26 ENCOUNTER — Telehealth: Payer: Self-pay | Admitting: *Deleted

## 2011-05-26 NOTE — Telephone Encounter (Signed)
Requests refill on fentanyl patch. Last refill 04/16/11. app't 06/11/11

## 2011-05-27 MED ORDER — FENTANYL 75 MCG/HR TD PT72: 1.0000 | MEDICATED_PATCH | TRANSDERMAL | Status: DC

## 2011-05-27 NOTE — Telephone Encounter (Signed)
Done

## 2011-05-28 ENCOUNTER — Telehealth: Payer: Self-pay | Admitting: *Deleted

## 2011-05-28 NOTE — Telephone Encounter (Signed)
Rx ready for pick up. 

## 2011-06-04 ENCOUNTER — Other Ambulatory Visit (HOSPITAL_COMMUNITY): Payer: Self-pay | Admitting: Internal Medicine

## 2011-06-04 DIAGNOSIS — Z1231 Encounter for screening mammogram for malignant neoplasm of breast: Secondary | ICD-10-CM

## 2011-06-11 ENCOUNTER — Encounter: Payer: Self-pay | Admitting: Physical Medicine & Rehabilitation

## 2011-06-11 ENCOUNTER — Encounter: Payer: Medicare Other | Attending: Neurosurgery | Admitting: Physical Medicine & Rehabilitation

## 2011-06-11 VITALS — BP 123/78 | HR 86 | Ht 64.0 in | Wt 134.0 lb

## 2011-06-11 DIAGNOSIS — I639 Cerebral infarction, unspecified: Secondary | ICD-10-CM | POA: Insufficient documentation

## 2011-06-11 DIAGNOSIS — M961 Postlaminectomy syndrome, not elsewhere classified: Secondary | ICD-10-CM

## 2011-06-11 DIAGNOSIS — I6350 Cerebral infarction due to unspecified occlusion or stenosis of unspecified cerebral artery: Secondary | ICD-10-CM

## 2011-06-11 DIAGNOSIS — M533 Sacrococcygeal disorders, not elsewhere classified: Secondary | ICD-10-CM

## 2011-06-11 HISTORY — DX: Postlaminectomy syndrome, not elsewhere classified: M96.1

## 2011-06-11 HISTORY — DX: Sacrococcygeal disorders, not elsewhere classified: M53.3

## 2011-06-11 MED ORDER — FENTANYL 25 MCG/HR TD PT72: 1.0000 | MEDICATED_PATCH | TRANSDERMAL | Status: AC

## 2011-06-11 MED ORDER — FENTANYL 50 MCG/HR TD PT72: 1.0000 | MEDICATED_PATCH | TRANSDERMAL | Status: AC

## 2011-06-11 NOTE — Progress Notes (Signed)
  Subjective:    Patient ID: Alexandria Mclaughlin, female    DOB: 10-Mar-1940, 71 y.o.   MRN: [id number]  HPI Elleah is back regarding her chronic buttock and leg pain. She is doing quite well overall.  She would like to try to decrease her fentanyl patch.  She is hardly using the percocet.  She went three days without the fentanyl patch and had some mild withdrawal symptoms, but really didn't have any other issues.  She developed some left facial numbness from the forehead to her eye. She's had no associated pain or rash. No other symptoms in either the arm or leg.    Pain Inventory Average Pain 3 Pain Right Now 2 My pain is intermittent, sharp and dull  In the last 24 hours, has pain interfered with the following? General activity 2 Relation with others 3 Enjoyment of life 2 What TIME of day is your pain at its worst? daytime Sleep (in general) Good  Pain is worse with: sitting and inactivity Pain improves with: medication Relief from Meds: 7  Mobility walk without assistance how many minutes can you walk? 60  Function retired  Neuro/Psych No problems in this area  Prior Studies Any changes since last visit?  no  Physicians involved in your care Any changes since last visit?  no   Review of Systems  All other systems reviewed and are negative.       Objective:   Physical Exam  Constitutional: She appears well-developed and well-nourished.  HENT:  Head: Normocephalic and atraumatic.  Eyes: EOM are normal. Pupils are equal, round, and reactive to light.  Neck: Normal range of motion. Neck supple.  Cardiovascular: Normal rate and regular rhythm.   Pulmonary/Chest: Effort normal and breath sounds normal.  Abdominal: Soft. Bowel sounds are normal.  Musculoskeletal:       Lumbar back: She exhibits tenderness, bony tenderness and pain.  Neurological: She is alert.       Decrease temperature and light touch sensation over left face.  Mild left facial droop and tongue  deviation. Visual fields intact. Speech is clear. Mild weakness 4 to 4+/5 left arm. Legs are equal . No pronator drift. Fair FMC of both arms.  Cognition appropriate. No gait or balance issues.   Skin: Skin is warm.  Psychiatric: She has a normal mood and affect. Her behavior is normal. Judgment and thought content normal.          Assessment & Plan:  ASSESSMENT:  1. Right buttock pain and post laminectomy syndrome.  2. Left leg discrepancy.  3. New onset (1 week ago) left facial numbness with mild motor findings on exam as well. ?subacute subcortical infarct PLAN:  1. I refilled Lyrica 100 mg 1 p.o. t.i.d. 90 with 5 refills.  2. Percocet 10/325 one p.o. q.6 h. p.r.n., 120 with no refill.  3. Wean fentanyl to off over the next month. 4. Will send for MRI of brain without contrast to r/o sub cortical infarct.  Advised an ECASA 325 mg in the interim. I will contact her PCP's office  Her questions were encouraged and answered.  She will follow up in  2 months unless otherwise directed by our office.

## 2011-06-11 NOTE — Patient Instructions (Signed)
Begin a coated aspirin 325mg  daily until otherwise directed.

## 2011-06-17 ENCOUNTER — Other Ambulatory Visit (HOSPITAL_COMMUNITY): Payer: Medicare Other

## 2011-06-18 ENCOUNTER — Ambulatory Visit (HOSPITAL_COMMUNITY)
Admission: RE | Admit: 2011-06-18 | Discharge: 2011-06-18 | Disposition: A | Discharge status: Home / Self Care | Payer: Medicare Other | Source: Ambulatory Visit | Attending: Physical Medicine & Rehabilitation | Admitting: Physical Medicine & Rehabilitation

## 2011-06-18 DIAGNOSIS — I639 Cerebral infarction, unspecified: Secondary | ICD-10-CM

## 2011-06-18 DIAGNOSIS — I998 Other disorder of circulatory system: Secondary | ICD-10-CM | POA: Insufficient documentation

## 2011-06-18 DIAGNOSIS — M961 Postlaminectomy syndrome, not elsewhere classified: Secondary | ICD-10-CM | POA: Insufficient documentation

## 2011-06-18 DIAGNOSIS — R209 Unspecified disturbances of skin sensation: Secondary | ICD-10-CM | POA: Insufficient documentation

## 2011-06-20 ENCOUNTER — Telehealth: Payer: Self-pay | Admitting: Physical Medicine & Rehabilitation

## 2011-06-20 NOTE — Telephone Encounter (Signed)
Notified Kajol

## 2011-06-20 NOTE — Telephone Encounter (Signed)
MRI is negative for anything that would account for the patient's facial numbness. Please let he know

## 2011-06-30 ENCOUNTER — Telehealth: Payer: Self-pay | Admitting: *Deleted

## 2011-06-30 MED ORDER — OXYCODONE-ACETAMINOPHEN 10-325 MG PO TABS: 1.0000 | ORAL_TABLET | Freq: Four times a day (QID) | ORAL | Status: DC | PRN

## 2011-06-30 NOTE — Telephone Encounter (Signed)
Refill on Oxycodone

## 2011-06-30 NOTE — Telephone Encounter (Signed)
Rx on [facility] cart to be signed. 

## 2011-07-01 ENCOUNTER — Telehealth: Payer: Self-pay | Admitting: Physical Medicine & Rehabilitation

## 2011-07-01 MED ORDER — ZOLPIDEM TARTRATE 10 MG PO TABS: 10.0000 mg | ORAL_TABLET | Freq: Every evening | ORAL | Status: DC | PRN

## 2011-07-01 NOTE — Telephone Encounter (Signed)
When can she pick up rx for Oxycodone?  Needs refill on Ambien.

## 2011-07-01 NOTE — Telephone Encounter (Signed)
Pt aware for a second time that her Oxycodone rx is ready for pick up. Ambien will be called into her pharmacy.

## 2011-07-01 NOTE — Telephone Encounter (Signed)
Rx is ready to be picked up, pt aware. 

## 2011-07-02 ENCOUNTER — Ambulatory Visit (HOSPITAL_COMMUNITY): Payer: Medicare Other

## 2011-07-29 ENCOUNTER — Other Ambulatory Visit: Payer: Self-pay | Admitting: *Deleted

## 2011-07-29 MED ORDER — ZOLPIDEM TARTRATE 10 MG PO TABS: 10.0000 mg | ORAL_TABLET | Freq: Every evening | ORAL | Status: DC | PRN

## 2011-08-08 ENCOUNTER — Encounter: Payer: Self-pay | Admitting: Physical Medicine & Rehabilitation

## 2011-08-08 ENCOUNTER — Encounter: Payer: Medicare Other | Attending: Neurosurgery | Admitting: Physical Medicine & Rehabilitation

## 2011-08-08 VITALS — BP 145/75 | HR 57 | Resp 14 | Ht 64.0 in | Wt 135.0 lb

## 2011-08-08 DIAGNOSIS — M2170 Unequal limb length (acquired), unspecified site: Secondary | ICD-10-CM

## 2011-08-08 DIAGNOSIS — M961 Postlaminectomy syndrome, not elsewhere classified: Secondary | ICD-10-CM

## 2011-08-08 DIAGNOSIS — M533 Sacrococcygeal disorders, not elsewhere classified: Secondary | ICD-10-CM

## 2011-08-08 HISTORY — DX: Unequal limb length (acquired), unspecified site: M21.70

## 2011-08-08 MED ORDER — OXYCODONE-ACETAMINOPHEN 10-325 MG PO TABS: 1.0000 | ORAL_TABLET | Freq: Four times a day (QID) | ORAL | Status: DC | PRN

## 2011-08-08 MED ORDER — ZOLPIDEM TARTRATE 10 MG PO TABS: 10.0000 mg | ORAL_TABLET | Freq: Every evening | ORAL | Status: DC | PRN

## 2011-08-08 MED ORDER — FENTANYL 50 MCG/HR TD PT72: 1.0000 | MEDICATED_PATCH | TRANSDERMAL | Status: AC

## 2011-08-08 MED ORDER — FENTANYL 50 MCG/HR TD PT72: 1.0000 | MEDICATED_PATCH | TRANSDERMAL | Status: DC

## 2011-08-08 NOTE — Patient Instructions (Signed)
Work on range of motion, stretching, massage, etc for your low back

## 2011-08-08 NOTE — Progress Notes (Signed)
Subjective:    Patient ID: Alexandria Mclaughlin, female    DOB: 1940/09/05, 71 y.o.   MRN: [id number]  HPI  Alexandria Mclaughlin is back regarding her chronic pain. We tried to wean her fentanyl patch to off but she couldn't tolerate going below 25, and really she wants to go back to 50mg . She tends to use only 2-3 percocets per day.  She wants to come off all her pain meds, but she knows that's not realistic right now. The pattern of her  pain has not changed.  From a health standpoint, she's experienced no new changes.   She's scheduled to go on a trip to [place] next month. She is quite excited about this.   Pain Inventory Average Pain 4 Pain Right Now 4 My pain is intermittent, sharp and stabbing  In the last 24 hours, has pain interfered with the following? General activity 4 Relation with others 5 Enjoyment of life 5 What TIME of day is your pain at its worst? daytime Sleep (in general) Fair  Pain is worse with: sitting and inactivity Pain improves with: medication Relief from Meds: 7  Mobility Do you have any goals in this area?  no  Function Do you have any goals in this area?  no  Neuro/Psych depression  Prior Studies Any changes since last visit?  no  Physicians involved in your care Any changes since last visit?  no   Family History  Problem Relation Age of Onset  . Diabetes Mother   . Heart disease Mother   . Heart disease Father   . Heart disease Brother    History   Social History  . Marital Status: Widowed    Spouse Name: N/A    Number of Children: N/A  . Years of Education: N/A   Social History Main Topics  . Smoking status: Former Smoker -- 0.5 packs/day for 5 years    Types: Cigarettes    Quit date: 03/15/1968  . Smokeless tobacco: None  . Alcohol Use: Yes     occasional glass of wine  . Drug Use: No  . Sexually Active: None   Other Topics Concern  . None   Social History Narrative  . None   History reviewed. No pertinent past surgical  history. Past Medical History  Diagnosis Date  . Cervical facet syndrome   . Enthesopathy of hip region   . Synovitis and tenosynovitis   . Disorders of sacrum   . Sciatic neuritis   . Depression    BP 145/75  Pulse 57  Resp 14  Ht 5\' 4"  (1.626 m)  Wt 135 lb (61.236 kg)  BMI 23.17 kg/m2  SpO2 99%     Review of Systems  Psychiatric/Behavioral: Positive for dysphoric mood.  All other systems reviewed and are negative.       Objective:   Physical Exam The patient is alert and appropriate. Pupils equal round and reactive to light. Extraocular eye movements intact. Heart is regular chest is clear and abdomen is soft and nontender. Low back is tender still around the operative site. She has pain with palpation to the right PSIS area and down into the mid back. Strength is generally intact at 4-5 out of 5. Reflexes appear symmetrical and are 1-2+ bilaterally. Cognitively she is alert and appropriate. Behaviorally she is intact.       Assessment & Plan:  ASSESSMENT:  1. Right buttock pain and post laminectomy syndrome.  2. Left leg discrepancy.  3. New onset  left facial numbness -likely isolated palsy (? CN V)--resolved   PLAN:  1. I refilled Lyrica 100 mg 1 p.o. t.i.d. 90 with 5 refills.  2. Percocet 10/325 one p.o. q.6 h. p.r.n., 100 3. Increase fentanyl to again for now.  Consider taper again in the fall or next year. 4. Discussed appropriate ROM, stretching, massage, desensitization exercises for her low back.  I think we have ruled out the SIJ or piriformis as causes of this pain.  I think scar tissue is playing a role.  She has no further hardware in her back.   Her questions were encouraged and answered.  She will follow up in 2 months unless otherwise directed by our office.

## 2011-08-14 ENCOUNTER — Telehealth: Payer: Self-pay

## 2011-08-14 NOTE — Telephone Encounter (Signed)
Spoke with pt regarding inconsistent UDS.  She states she had taken percocet and lyrica both she has a script for.  Advised her to inform us of all medication she is taking every visit.

## 2011-10-20 ENCOUNTER — Encounter
Payer: Medicare Other | Attending: Physical Medicine and Rehabilitation | Admitting: Physical Medicine and Rehabilitation

## 2011-10-20 ENCOUNTER — Encounter: Payer: Self-pay | Admitting: Physical Medicine and Rehabilitation

## 2011-10-20 VITALS — BP 156/72 | HR 79 | Resp 14 | Ht 64.0 in | Wt 136.0 lb

## 2011-10-20 DIAGNOSIS — G8929 Other chronic pain: Secondary | ICD-10-CM

## 2011-10-20 DIAGNOSIS — M545 Low back pain, unspecified: Secondary | ICD-10-CM

## 2011-10-20 DIAGNOSIS — M533 Sacrococcygeal disorders, not elsewhere classified: Secondary | ICD-10-CM

## 2011-10-20 DIAGNOSIS — M2170 Unequal limb length (acquired), unspecified site: Secondary | ICD-10-CM

## 2011-10-20 DIAGNOSIS — R209 Unspecified disturbances of skin sensation: Secondary | ICD-10-CM | POA: Insufficient documentation

## 2011-10-20 DIAGNOSIS — IMO0001 Reserved for inherently not codable concepts without codable children: Secondary | ICD-10-CM | POA: Insufficient documentation

## 2011-10-20 DIAGNOSIS — M961 Postlaminectomy syndrome, not elsewhere classified: Secondary | ICD-10-CM

## 2011-10-20 MED ORDER — OXYCODONE-ACETAMINOPHEN 10-325 MG PO TABS: 1.0000 | ORAL_TABLET | Freq: Four times a day (QID) | ORAL | Status: DC | PRN

## 2011-10-20 MED ORDER — FENTANYL 50 MCG/HR TD PT72: 1.0000 | MEDICATED_PATCH | TRANSDERMAL | Status: DC

## 2011-10-20 NOTE — Progress Notes (Signed)
Subjective:    Patient ID: Alexandria Mclaughlin, female    DOB: 1940/08/01, 71 y.o.   MRN: [id number]  HPI The patient is a 71 year old female female, who presents with chronic low back pain . The symptoms started 14 years ago. Hx of PSF at L4-5, 2004, ,and removal of hardware in 2010. The patient complains about moderate pain , which radiate to her right posterior hip.  She describes the pain as stabbing   . Applying heat, taking medications , changing positions alleviate the symptoms. Prolonged sitting and standing aggrevates the symptoms. The patient grades his pain as a 4  /10. Pain Inventory Average Pain 4 Pain Right Now 4 My pain is intermittent and stabbing  In the last 24 hours, has pain interfered with the following? General activity 2 Relation with others 3 Enjoyment of life 3 What TIME of day is your pain at its worst? daytime Sleep (in general) Good  Pain is worse with: standing Pain improves with: medication Relief from Meds: 6  Mobility walk without assistance Do you have any goals in this area?  no  Function Do you have any goals in this area?  no  Neuro/Psych No problems in this area  Prior Studies x-rays  Physicians involved in your care Any changes since last visit?  no   Family History  Problem Relation Age of Onset  . Diabetes Mother   . Heart disease Mother   . Heart disease Father   . Heart disease Brother    History   Social History  . Marital Status: Widowed    Spouse Name: N/A    Number of Children: N/A  . Years of Education: N/A   Social History Main Topics  . Smoking status: Former Smoker -- 0.5 packs/day for 5 years    Types: Cigarettes    Quit date: 03/15/1968  . Smokeless tobacco: None  . Alcohol Use: Yes     occasional glass of wine  . Drug Use: No  . Sexually Active: None   Other Topics Concern  . None   Social History Narrative  . None   History reviewed. No pertinent past surgical history. Past Medical History  Diagnosis  Date  . Cervical facet syndrome   . Enthesopathy of hip region   . Synovitis and tenosynovitis   . Disorders of sacrum   . Sciatic neuritis   . Depression    BP 156/72  Pulse 79  Resp 14  Ht 5\' 4"  (1.626 m)  Wt 136 lb (61.689 kg)  BMI 23.34 kg/m2  SpO2 98%     Review of Systems  Musculoskeletal: Positive for myalgias, back pain and arthralgias.  All other systems reviewed and are negative.       Objective:   Physical Exam  Constitutional: She is oriented to person, place, and time. She appears well-developed and well-nourished.  HENT:  Head: Normocephalic.  Neck: Neck supple.  Musculoskeletal: She exhibits tenderness.  Neurological: She is alert and oriented to person, place, and time.  Skin: Skin is warm and dry.  Psychiatric: She has a normal mood and affect.    Symmetric normal motor tone is noted throughout, except increased muscle tone in trapezius bilateral. Normal muscle bulk. Muscle testing reveals 5/5 muscle strength of the upper extremity, and 5/5 of the lower extremity. Full range of motion in upper and lower extremities. ROM of spine is restricted. Fine motor movements are normal in both hands. DTR in the upper and lower extremity  are present and symmetric 2+. No clonus is noted.  Patient arises from chair with mild difficulty. Narrow based gait with normal arm swing bilateral , able to walk on heels and toes . Tandem walk is stable. No pronator drift. Rhomberg negative.        Assessment & Plan:  1. Right buttock pain and post laminectomy syndrome.  2. Left leg discrepancy.  3. New onset left facial numbness -resolved  PLAN:  1. I refilled Percocet 10/325 one p.o. q.6 h. p.r.n., 100 ; and fentanyl to again for now. Consider taper again in the fall or next year.  4. Discussed appropriate ROM, stretching, massage, desensitization exercises for her low back, especially water exercises would be very beneficial for her. Follow up in 1 month.

## 2011-10-20 NOTE — Patient Instructions (Signed)
Advised patient to look into water aerobics.

## 2011-10-21 ENCOUNTER — Other Ambulatory Visit: Payer: Self-pay

## 2011-10-21 MED ORDER — PREGABALIN 100 MG PO CAPS: 100.0000 mg | ORAL_CAPSULE | Freq: Three times a day (TID) | ORAL | Status: DC

## 2011-10-26 HISTORY — PX: RETINAL DETACHMENT SURGERY: SHX105

## 2011-11-14 ENCOUNTER — Encounter: Payer: Medicare Other | Admitting: Physical Medicine and Rehabilitation

## 2011-11-19 ENCOUNTER — Telehealth: Payer: Self-pay | Admitting: *Deleted

## 2011-11-19 MED ORDER — DULOXETINE HCL 30 MG PO CPEP: 30.0000 mg | ORAL_CAPSULE | Freq: Every day | ORAL | Status: DC

## 2011-11-19 NOTE — Telephone Encounter (Signed)
Rx has been sent in, pt aware. 

## 2011-11-19 NOTE — Telephone Encounter (Signed)
Refill Cymbalta

## 2011-11-21 ENCOUNTER — Encounter
Payer: Medicare Other | Attending: Physical Medicine and Rehabilitation | Admitting: Physical Medicine and Rehabilitation

## 2011-11-21 ENCOUNTER — Encounter: Payer: Self-pay | Admitting: Physical Medicine and Rehabilitation

## 2011-11-21 VITALS — BP 165/88 | HR 65 | Resp 16 | Ht 62.0 in | Wt 138.0 lb

## 2011-11-21 DIAGNOSIS — IMO0001 Reserved for inherently not codable concepts without codable children: Secondary | ICD-10-CM | POA: Insufficient documentation

## 2011-11-21 DIAGNOSIS — G8929 Other chronic pain: Secondary | ICD-10-CM | POA: Insufficient documentation

## 2011-11-21 DIAGNOSIS — M961 Postlaminectomy syndrome, not elsewhere classified: Secondary | ICD-10-CM

## 2011-11-21 DIAGNOSIS — R209 Unspecified disturbances of skin sensation: Secondary | ICD-10-CM | POA: Insufficient documentation

## 2011-11-21 DIAGNOSIS — M533 Sacrococcygeal disorders, not elsewhere classified: Secondary | ICD-10-CM

## 2011-11-21 DIAGNOSIS — M79604 Pain in right leg: Secondary | ICD-10-CM

## 2011-11-21 DIAGNOSIS — M545 Low back pain, unspecified: Secondary | ICD-10-CM

## 2011-11-21 DIAGNOSIS — M2170 Unequal limb length (acquired), unspecified site: Secondary | ICD-10-CM

## 2011-11-21 MED ORDER — OXYCODONE-ACETAMINOPHEN 10-325 MG PO TABS: 1.0000 | ORAL_TABLET | Freq: Four times a day (QID) | ORAL | Status: DC | PRN

## 2011-11-21 MED ORDER — FENTANYL 50 MCG/HR TD PT72: 1.0000 | MEDICATED_PATCH | TRANSDERMAL | Status: DC

## 2011-11-21 NOTE — Progress Notes (Signed)
Subjective:    Patient ID: Alexandria Mclaughlin, female    DOB: 30-Aug-1940, 71 y.o.   MRN: [id number]  HPI The patient is a 71 year old female female, who presents with chronic low back pain . The symptoms started 14 years ago. Hx of PSF at L4-5, 2004, ,and removal of hardware in 2010. The patient complains about moderate pain , which radiate to her right posterior hip. She describes the pain as stabbing . Applying heat, taking medications , changing positions alleviate the symptoms. Prolonged sitting and standing aggrevates the symptoms. The patient grades his pain as a 4 /10.  Pain Inventory Average Pain 4 Pain Right Now 4 My pain is sharp and stabbing  In the last 24 hours, has pain interfered with the following? General activity 5 Relation with others 4 Enjoyment of life 5 What TIME of day is your pain at its worst? daytime Sleep (in general) Good  Pain is worse with: sitting and standing Pain improves with: heat/ice and medication Relief from Meds: 5  Mobility Do you have any goals in this area?  no  Function Do you have any goals in this area?  no  Neuro/Psych No problems in this area  Prior Studies Any changes since last visit?  no  Physicians involved in your care Any changes since last visit?  no   Family History  Problem Relation Age of Onset  . Diabetes Mother   . Heart disease Mother   . Heart disease Father   . Heart disease Brother    History   Social History  . Marital Status: Widowed    Spouse Name: N/A    Number of Children: N/A  . Years of Education: N/A   Social History Main Topics  . Smoking status: Former Smoker -- 0.5 packs/day for 5 years    Types: Cigarettes    Quit date: 03/15/1968  . Smokeless tobacco: None  . Alcohol Use: Yes     occasional glass of wine  . Drug Use: No  . Sexually Active: None   Other Topics Concern  . None   Social History Narrative  . None   History reviewed. No pertinent past surgical history. Past Medical  History  Diagnosis Date  . Cervical facet syndrome   . Enthesopathy of hip region   . Synovitis and tenosynovitis   . Disorders of sacrum   . Sciatic neuritis   . Depression    There were no vitals taken for this visit.      Review of Systems  Constitutional: Negative.   HENT: Negative.   Eyes: Negative.   Respiratory: Negative.   Cardiovascular: Negative.   Gastrointestinal: Negative.   Genitourinary: Negative.   Musculoskeletal: Positive for back pain.  Neurological: Negative.   Hematological: Negative.   Psychiatric/Behavioral: Negative.        Objective:   Physical Exam Constitutional: She is oriented to person, place, and time. She appears well-developed and well-nourished.  HENT:  Head: Normocephalic.  Neck: Neck supple.  Musculoskeletal: She exhibits tenderness.  Neurological: She is alert and oriented to person, place, and time.  Skin: Skin is warm and dry.  Psychiatric: She has a normal mood and affect.   Symmetric normal motor tone is noted throughout, except increased muscle tone in trapezius bilateral. Normal muscle bulk. Muscle testing reveals 5/5 muscle strength of the upper extremity, and 5/5 of the lower extremity. Full range of motion in upper and lower extremities. ROM of spine is restricted. Fine motor  movements are normal in both hands.  DTR in the upper and lower extremity are present and symmetric 2+. No clonus is noted.  Patient arises from chair with mild difficulty. Narrow based gait with normal arm swing bilateral , able to walk on heels and toes . Tandem walk is stable. No pronator drift. Rhomberg negative.        Assessment & Plan:  1. Right buttock pain and post laminectomy syndrome.  2. Left leg discrepancy.  3. New onset left facial numbness -resolved  PLAN:  1. I refilled Percocet 10/325 one p.o. q.6 h. p.r.n., 100 ; and fentanyl to again for now. Consider taper again in the fall or next year.  4. Discussed appropriate ROM,  stretching, massage, desensitization exercises for her low back, especially water exercises would be very beneficial for her, she is a member in a [employer] now, and wants to start water aerobics after [place].  Follow up in 1 month.

## 2011-11-21 NOTE — Patient Instructions (Signed)
Try to start with water exercises in your [facility]

## 2011-11-24 ENCOUNTER — Telehealth: Payer: Self-pay | Admitting: *Deleted

## 2011-11-24 MED ORDER — TRAZODONE HCL 100 MG PO TABS: 100.0000 mg | ORAL_TABLET | Freq: Every day | ORAL | Status: DC

## 2011-11-24 NOTE — Telephone Encounter (Signed)
Trazodone refill.  This has been sent in. Pt aware.

## 2011-12-01 NOTE — Progress Notes (Signed)
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

## 2011-12-15 ENCOUNTER — Telehealth: Payer: Self-pay | Admitting: Physical Medicine & Rehabilitation

## 2011-12-15 NOTE — Telephone Encounter (Signed)
FYI - patient having emergency detached retina surgery in the morning.

## 2011-12-16 ENCOUNTER — Telehealth: Payer: Self-pay | Admitting: Physical Medicine & Rehabilitation

## 2011-12-16 NOTE — Telephone Encounter (Signed)
Lm returning patient call.

## 2011-12-16 NOTE — Telephone Encounter (Signed)
Wants call back re: surgery today.

## 2011-12-17 ENCOUNTER — Encounter: Payer: Medicare Other | Admitting: Physical Medicine and Rehabilitation

## 2011-12-17 NOTE — Telephone Encounter (Signed)
Patient just wanted to inform Alexandria Mclaughlin of her retina detachment surgery.  She will follow up in 2 weeks.

## 2011-12-23 ENCOUNTER — Other Ambulatory Visit: Payer: Self-pay | Admitting: Physical Medicine & Rehabilitation

## 2011-12-24 ENCOUNTER — Telehealth: Payer: Self-pay

## 2011-12-24 NOTE — Telephone Encounter (Signed)
Patient informed med has been called in.

## 2011-12-24 NOTE — Telephone Encounter (Signed)
Patient needs ambien refilled at [facility].

## 2011-12-31 ENCOUNTER — Encounter: Payer: Self-pay | Admitting: Physical Medicine and Rehabilitation

## 2011-12-31 ENCOUNTER — Encounter
Payer: Medicare Other | Attending: Physical Medicine and Rehabilitation | Admitting: Physical Medicine and Rehabilitation

## 2011-12-31 VITALS — BP 130/72 | HR 99 | Resp 14 | Ht 64.0 in | Wt 135.0 lb

## 2011-12-31 DIAGNOSIS — M533 Sacrococcygeal disorders, not elsewhere classified: Secondary | ICD-10-CM

## 2011-12-31 DIAGNOSIS — R209 Unspecified disturbances of skin sensation: Secondary | ICD-10-CM | POA: Insufficient documentation

## 2011-12-31 DIAGNOSIS — Z5181 Encounter for therapeutic drug level monitoring: Secondary | ICD-10-CM

## 2011-12-31 DIAGNOSIS — M2170 Unequal limb length (acquired), unspecified site: Secondary | ICD-10-CM | POA: Insufficient documentation

## 2011-12-31 DIAGNOSIS — M545 Low back pain, unspecified: Secondary | ICD-10-CM

## 2011-12-31 DIAGNOSIS — M79604 Pain in right leg: Secondary | ICD-10-CM

## 2011-12-31 DIAGNOSIS — G8929 Other chronic pain: Secondary | ICD-10-CM | POA: Insufficient documentation

## 2011-12-31 DIAGNOSIS — IMO0001 Reserved for inherently not codable concepts without codable children: Secondary | ICD-10-CM | POA: Insufficient documentation

## 2011-12-31 DIAGNOSIS — M961 Postlaminectomy syndrome, not elsewhere classified: Secondary | ICD-10-CM | POA: Insufficient documentation

## 2011-12-31 MED ORDER — FENTANYL 50 MCG/HR TD PT72: 1.0000 | MEDICATED_PATCH | TRANSDERMAL | Status: DC

## 2011-12-31 MED ORDER — OXYCODONE-ACETAMINOPHEN 10-325 MG PO TABS: 1.0000 | ORAL_TABLET | Freq: Four times a day (QID) | ORAL | Status: DC | PRN

## 2011-12-31 NOTE — Patient Instructions (Signed)
Try to start exercising, when your right eye has healed completely.

## 2011-12-31 NOTE — Addendum Note (Signed)
Addended by: Judd Gaudier on: 12/31/2011 01:36 PM   Modules accepted: Orders

## 2011-12-31 NOTE — Progress Notes (Signed)
Subjective:    Patient ID: Alexandria Mclaughlin, female    DOB: [birth year], 71 y.o.   MRN: [id number]  HPI The patient is a 71 year old female female, who presents with chronic low back pain . The symptoms started 14 years ago. Hx of PSF at L4-5, 2004, ,and removal of hardware in 2010. The patient complains about moderate pain , which radiate to her right posterior hip. She describes the pain as stabbing . Applying heat, taking medications , changing positions alleviate the symptoms. Prolonged sitting and standing aggrevates the symptoms. The patient grades her pain as a 4 /10. The patient reports, that she could not start exercising, because she had a detached retina, and surgery on her right eye, and has to rest .  Pain Inventory Average Pain 4 Pain Right Now 4 My pain is intermittent, sharp and stabbing  In the last 24 hours, has pain interfered with the following? General activity 3 Relation with others 3 Enjoyment of life 4 What TIME of day is your pain at its worst? daytime Sleep (in general) Fair  Pain is worse with: sitting and standing Pain improves with: medication Relief from Meds: 6  Mobility Do you have any goals in this area?  no  Function retired Do you have any goals in this area?  no  Neuro/Psych No problems in this area  Prior Studies Any changes since last visit?  no  Physicians involved in your care Any changes since last visit?  no   Family History  Problem Relation Age of Onset  . Diabetes Mother   . Heart disease Mother   . Heart disease Father   . Heart disease Brother    History   Social History  . Marital Status: Widowed    Spouse Name: N/A    Number of Children: N/A  . Years of Education: N/A   Social History Main Topics  . Smoking status: Former Smoker -- 0.5 packs/day for 5 years    Types: Cigarettes    Quit date: 03/15/1968  . Smokeless tobacco: None  . Alcohol Use: Yes     Comment: occasional glass of wine  . Drug Use: No  . Sexually  Active: None   Other Topics Concern  . None   Social History Narrative  . None   Past Surgical History  Procedure Date  . Retinal detachment surgery 9/13   Past Medical History  Diagnosis Date  . Cervical facet syndrome   . Enthesopathy of hip region   . Synovitis and tenosynovitis   . Disorders of sacrum   . Sciatic neuritis   . Depression    BP 130/72  Pulse 99  Resp 14  Ht 5\' 4"  (1.626 m)  Wt 135 lb (61.236 kg)  BMI 23.17 kg/m2  SpO2 97%     Review of Systems  Musculoskeletal: Positive for myalgias, back pain and arthralgias.  All other systems reviewed and are negative.       Objective:   Physical Exam Constitutional: She is oriented to person, place, and time. She appears well-developed and well-nourished.  HENT:  Head: Normocephalic.  Neck: Neck supple.  Musculoskeletal: She exhibits tenderness.  Neurological: She is alert and oriented to person, place, and time.  Skin: Skin is warm and dry.  Psychiatric: She has a normal mood and affect.  Symmetric normal motor tone is noted throughout, except increased muscle tone in trapezius bilateral. Normal muscle bulk. Muscle testing reveals 5/5 muscle strength of the upper extremity,  and 5/5 of the lower extremity. Full range of motion in upper and lower extremities. ROM of spine is restricted. Fine motor movements are normal in both hands.  DTR in the upper and lower extremity are present and symmetric 2+. No clonus is noted.  Patient arises from chair with mild difficulty. Narrow based gait with normal arm swing bilateral , able to walk on heels and toes . Tandem walk is stable. No pronator drift. Rhomberg negative.        Assessment & Plan:  1. Right buttock pain and post laminectomy syndrome.  2. Left leg discrepancy.  3. New onset left facial numbness -resolved  PLAN:  1. I refilled Percocet 10/325 one p.o. q.6 h. p.r.n., 100 ; and fentanyl to again for now. Consider taper again in the fall or  next year.  4. Discussed appropriate ROM, stretching, massage, desensitization exercises for her low back, especially water exercises would be very beneficial for her, she is a member in a [employer] now, and wants to start water aerobics after her right eye has healed completely, s/p retina detachment, surgery in right eye.  Follow up in 1 month.

## 2012-01-28 ENCOUNTER — Encounter
Payer: Medicare Other | Attending: Physical Medicine and Rehabilitation | Admitting: Physical Medicine and Rehabilitation

## 2012-01-28 ENCOUNTER — Encounter: Payer: Self-pay | Admitting: Physical Medicine and Rehabilitation

## 2012-01-28 VITALS — BP 163/93 | HR 66 | Resp 14 | Ht 64.0 in | Wt 133.8 lb

## 2012-01-28 DIAGNOSIS — M961 Postlaminectomy syndrome, not elsewhere classified: Secondary | ICD-10-CM

## 2012-01-28 DIAGNOSIS — R209 Unspecified disturbances of skin sensation: Secondary | ICD-10-CM | POA: Insufficient documentation

## 2012-01-28 DIAGNOSIS — M533 Sacrococcygeal disorders, not elsewhere classified: Secondary | ICD-10-CM

## 2012-01-28 DIAGNOSIS — IMO0001 Reserved for inherently not codable concepts without codable children: Secondary | ICD-10-CM | POA: Insufficient documentation

## 2012-01-28 DIAGNOSIS — M2170 Unequal limb length (acquired), unspecified site: Secondary | ICD-10-CM

## 2012-01-28 DIAGNOSIS — G8929 Other chronic pain: Secondary | ICD-10-CM | POA: Insufficient documentation

## 2012-01-28 MED ORDER — TRAZODONE HCL 100 MG PO TABS: 100.0000 mg | ORAL_TABLET | Freq: Every day | ORAL | Status: DC

## 2012-01-28 MED ORDER — OXYCODONE-ACETAMINOPHEN 10-325 MG PO TABS: 1.0000 | ORAL_TABLET | Freq: Four times a day (QID) | ORAL | Status: DC | PRN

## 2012-01-28 MED ORDER — FENTANYL 50 MCG/HR TD PT72: 1.0000 | MEDICATED_PATCH | TRANSDERMAL | Status: DC

## 2012-01-28 NOTE — Progress Notes (Signed)
Subjective:    Patient ID: Alexandria Mclaughlin, female    DOB: 08/03/1940, 71 y.o.   MRN: [id number]  HPI The patient is a 71 year old female female, who presents with chronic low back pain . The symptoms started 14 years ago. Hx of PSF at L4-5, 2004, ,and removal of hardware in 2010. The patient complains about moderate pain , which radiate to her right posterior hip. She describes the pain as stabbing . Applying heat, taking medications , changing positions alleviate the symptoms. Prolonged sitting and standing aggrevates the symptoms. The patient grades her pain as a 4 /10. The patient reports, that she could not start exercising, because she had a detached retina, and surgery on her right eye, and has to rest, she might be able to start again. She has an appointment with her ophthamologist tomorrow . She states, that she would like to taper down her Lyrica, and d/c it eventually, because it is so expensive.  Pain Inventory Average Pain 3 Pain Right Now 3 My pain is intermittent, sharp and stabbing  In the last 24 hours, has pain interfered with the following? General activity 3 Relation with others 3 Enjoyment of life 4 What TIME of day is your pain at its worst? daytime Sleep (in general) Fair  Pain is worse with: sitting and standing Pain improves with: heat/ice and medication Relief from Meds: 7  Mobility walk without assistance how many minutes can you walk? 60 ability to climb steps?  yes do you drive?  yes Do you have any goals in this area?  no  Function retired Do you have any goals in this area?  no  Neuro/Psych No problems in this area  Prior Studies Any changes since last visit?  no  Physicians involved in your care Any changes since last visit?  no   Family History  Problem Relation Age of Onset  . Diabetes Mother   . Heart disease Mother   . Heart disease Father   . Heart disease Brother    History   Social History  . Marital Status: Widowed    Spouse  Name: N/A    Number of Children: N/A  . Years of Education: N/A   Social History Main Topics  . Smoking status: Former Smoker -- 0.5 packs/day for 5 years    Types: Cigarettes    Quit date: 03/15/1968  . Smokeless tobacco: None  . Alcohol Use: Yes     Comment: occasional glass of wine  . Drug Use: No  . Sexually Active: None   Other Topics Concern  . None   Social History Narrative  . None   Past Surgical History  Procedure Date  . Retinal detachment surgery 9/13   Past Medical History  Diagnosis Date  . Cervical facet syndrome   . Enthesopathy of hip region   . Synovitis and tenosynovitis   . Disorders of sacrum   . Sciatic neuritis   . Depression    BP 163/93  Pulse 66  Resp 14  Ht 5\' 4"  (1.626 m)  Wt 133 lb 12.8 oz (60.691 kg)  BMI 22.97 kg/m2  SpO2 97%    Review of Systems  Musculoskeletal: Positive for back pain.  All other systems reviewed and are negative.       Objective:   Physical Exam Constitutional: She is oriented to person, place, and time. She appears well-developed and well-nourished.  HENT:  Head: Normocephalic.  Neck: Neck supple.  Musculoskeletal: She exhibits tenderness.  Neurological: She is alert and oriented to person, place, and time.  Skin: Skin is warm and dry.  Psychiatric: She has a normal mood and affect.  Symmetric normal motor tone is noted throughout, except increased muscle tone in trapezius bilateral. Normal muscle bulk. Muscle testing reveals 5/5 muscle strength of the upper extremity, and 5/5 of the lower extremity. Full range of motion in upper and lower extremities. ROM of spine is restricted. Fine motor movements are normal in both hands.  DTR in the upper and lower extremity are present and symmetric 2+. No clonus is noted.  Patient arises from chair with mild difficulty. Narrow based gait with normal arm swing bilateral , able to walk on heels and toes . Tandem walk is stable. No pronator drift. Rhomberg  negative.        Assessment & Plan:  1. Right buttock pain and post laminectomy syndrome.  2. Left leg discrepancy.  3. New onset left facial numbness -resolved  PLAN:  1. I refilled Percocet 10/325 one p.o. q.6 h. p.r.n., 100 ; and fentanyl to again for now. Consider taper again in the fall or next year. Refilled Trazodone 2. Discussed tapering of Lyrica,patient should take one tablet less for one week, until d/c, might consider increasing Cymbalta from 30mg  to 60, if pain increases without the Lyrica. 4. Discussed appropriate ROM, stretching, massage, desensitization exercises for her low back, especially water exercises would be very beneficial for her, she is a member in a [employer] now, and wants to start water aerobics after her right eye has healed completely, s/p retina detachment, surgery in right eye. She will see her ophthamologist tomorrow.  Follow up in 1 month.

## 2012-01-28 NOTE — Patient Instructions (Signed)
Restart exercising in the pool, when your ophthamologist allows you to do so. You can try bedtime tea, for a more restful sleep.

## 2012-02-19 ENCOUNTER — Other Ambulatory Visit: Payer: Self-pay | Admitting: Physical Medicine & Rehabilitation

## 2012-03-01 ENCOUNTER — Encounter: Payer: Self-pay | Admitting: Physical Medicine and Rehabilitation

## 2012-03-01 ENCOUNTER — Encounter
Payer: Medicare Other | Attending: Physical Medicine and Rehabilitation | Admitting: Physical Medicine and Rehabilitation

## 2012-03-01 VITALS — BP 152/100 | HR 70 | Resp 14 | Ht 63.0 in | Wt 140.0 lb

## 2012-03-01 DIAGNOSIS — M961 Postlaminectomy syndrome, not elsewhere classified: Secondary | ICD-10-CM

## 2012-03-01 DIAGNOSIS — Z981 Arthrodesis status: Secondary | ICD-10-CM | POA: Insufficient documentation

## 2012-03-01 DIAGNOSIS — M2170 Unequal limb length (acquired), unspecified site: Secondary | ICD-10-CM

## 2012-03-01 DIAGNOSIS — M533 Sacrococcygeal disorders, not elsewhere classified: Secondary | ICD-10-CM

## 2012-03-01 DIAGNOSIS — M545 Low back pain, unspecified: Secondary | ICD-10-CM

## 2012-03-01 DIAGNOSIS — G8929 Other chronic pain: Secondary | ICD-10-CM | POA: Insufficient documentation

## 2012-03-01 MED ORDER — FENTANYL 50 MCG/HR TD PT72: 1.0000 | MEDICATED_PATCH | TRANSDERMAL | Status: DC

## 2012-03-01 MED ORDER — OXYCODONE-ACETAMINOPHEN 10-325 MG PO TABS: 1.0000 | ORAL_TABLET | Freq: Four times a day (QID) | ORAL | Status: DC | PRN

## 2012-03-01 NOTE — Progress Notes (Signed)
Subjective:    Patient ID: Alexandria Mclaughlin, female    DOB: 08/12/1940, 72 y.o.   MRN: [id number]  HPI The patient is a 72 year old female female, who presents with chronic low back pain . The symptoms started 14 years ago. Hx of PSF at L4-5, 2004, ,and removal of hardware in 2010. The patient complains about moderate pain , which radiate to her right posterior hip. She describes the pain as stabbing . Applying heat, taking medications , changing positions alleviate the symptoms. Prolonged sitting and standing aggrevates the symptoms. The patient grades her pain as a 4 /10. The patient reports, that she does aquatic exercises 1-3 times per week.      Pain Inventory Average Pain 4 Pain Right Now 3 My pain is sharp and stabbing  In the last 24 hours, has pain interfered with the following? General activity 4 Relation with others 5 Enjoyment of life 4 What TIME of day is your pain at its worst? daytime Sleep (in general) Fair  Pain is worse with: sitting and standing Pain improves with: rest, heat/ice, therapy/exercise, pacing activities and medication Relief from Meds: 4  Mobility Do you have any goals in this area?  no  Function Do you have any goals in this area?  no  Neuro/Psych No problems in this area  Prior Studies Any changes since last visit?  no  Physicians involved in your care Any changes since last visit?  no   Family History  Problem Relation Age of Onset  . Diabetes Mother   . Heart disease Mother   . Heart disease Father   . Heart disease Brother    History   Social History  . Marital Status: Widowed    Spouse Name: N/A    Number of Children: N/A  . Years of Education: N/A   Social History Main Topics  . Smoking status: Former Smoker -- 0.5 packs/day for 5 years    Types: Cigarettes    Quit date: 03/15/1968  . Smokeless tobacco: None  . Alcohol Use: Yes     Comment: occasional glass of wine  . Drug Use: No  . Sexually Active: None   Other  Topics Concern  . None   Social History Narrative  . None   Past Surgical History  Procedure Date  . Retinal detachment surgery 9/13   Past Medical History  Diagnosis Date  . Cervical facet syndrome   . Enthesopathy of hip region   . Synovitis and tenosynovitis   . Disorders of sacrum   . Sciatic neuritis   . Depression    BP 152/100  Pulse 70  Resp 14  Ht 5\' 3"  (1.6 m)  Wt 140 lb (63.504 kg)  BMI 24.80 kg/m2  SpO2 97%     Review of Systems  Musculoskeletal: Positive for back pain.  All other systems reviewed and are negative.       Objective:   Physical Exam Constitutional: She is oriented to person, place, and time. She appears well-developed and well-nourished.  HENT:  Head: Normocephalic.  Neck: Neck supple.  Musculoskeletal: She exhibits tenderness.  Neurological: She is alert and oriented to person, place, and time.  Skin: Skin is warm and dry.  Psychiatric: She has a normal mood and affect.  Symmetric normal motor tone is noted throughout, except increased muscle tone in trapezius bilateral. Normal muscle bulk. Muscle testing reveals 5/5 muscle strength of the upper extremity, and 5/5 of the lower extremity. Full range of  motion in upper and lower extremities. ROM of spine is restricted. Fine motor movements are normal in both hands.  DTR in the upper and lower extremity are present and symmetric 2+. No clonus is noted.  Patient arises from chair with mild difficulty. Narrow based gait with normal arm swing bilateral , able to walk on heels and toes . Tandem walk is stable. No pronator drift. Rhomberg negative.        Assessment & Plan:  1. I refilled Percocet 10/325 one p.o. q.6 h. p.r.n., 100 ; and fentanyl to again for now. Consider taper again in the fall or next year. Refilled Trazodone  2. Discussed tapering of Lyrica,patient should take one tablet less for one week, until d/c, might consider increasing Cymbalta from 30mg  to 60, if pain  increases without the Lyrica.  4. Discussed appropriate ROM, stretching, massage, desensitization exercises for her low back, especially water exercises would be very beneficial for her, she is a member in a [employer] now, and has started water aerobics, which is beneficial.I encouraged her to continue.

## 2012-03-01 NOTE — Patient Instructions (Signed)
Continue with aquatic exercises in the gym.

## 2012-03-16 ENCOUNTER — Other Ambulatory Visit: Payer: Self-pay | Admitting: Physical Medicine & Rehabilitation

## 2012-03-31 ENCOUNTER — Encounter
Payer: Medicare Other | Attending: Physical Medicine and Rehabilitation | Admitting: Physical Medicine and Rehabilitation

## 2012-03-31 ENCOUNTER — Encounter: Payer: Self-pay | Admitting: Physical Medicine and Rehabilitation

## 2012-03-31 VITALS — BP 144/71 | HR 79 | Resp 14 | Ht 63.0 in | Wt 141.0 lb

## 2012-03-31 DIAGNOSIS — G8929 Other chronic pain: Secondary | ICD-10-CM | POA: Insufficient documentation

## 2012-03-31 DIAGNOSIS — M533 Sacrococcygeal disorders, not elsewhere classified: Secondary | ICD-10-CM

## 2012-03-31 DIAGNOSIS — M961 Postlaminectomy syndrome, not elsewhere classified: Secondary | ICD-10-CM

## 2012-03-31 DIAGNOSIS — IMO0001 Reserved for inherently not codable concepts without codable children: Secondary | ICD-10-CM | POA: Insufficient documentation

## 2012-03-31 DIAGNOSIS — M2170 Unequal limb length (acquired), unspecified site: Secondary | ICD-10-CM

## 2012-03-31 DIAGNOSIS — R209 Unspecified disturbances of skin sensation: Secondary | ICD-10-CM | POA: Insufficient documentation

## 2012-03-31 MED ORDER — TRAZODONE HCL 100 MG PO TABS: 100.0000 mg | ORAL_TABLET | Freq: Every day | ORAL | Status: DC

## 2012-03-31 MED ORDER — FENTANYL 50 MCG/HR TD PT72: 1.0000 | MEDICATED_PATCH | TRANSDERMAL | Status: DC

## 2012-03-31 MED ORDER — OXYCODONE-ACETAMINOPHEN 10-325 MG PO TABS: 1.0000 | ORAL_TABLET | Freq: Four times a day (QID) | ORAL | Status: DC | PRN

## 2012-03-31 NOTE — Progress Notes (Signed)
Subjective:    Patient ID: Alexandria Mclaughlin, female    DOB: January 26, 1941, 72 y.o.   MRN: [id number]  HPI The patient is a 72 year old female female, who presents with chronic low back pain . The symptoms started 14 years ago. Hx of PSF at L4-5, 2004, ,and removal of hardware in 2010. The patient complains about moderate pain , which radiate to her right posterior hip. She describes the pain as stabbing . Applying heat, taking medications , changing positions alleviate the symptoms. Prolonged sitting and standing aggrevates the symptoms. The patient grades her pain as a 4 /10. The patient reports, that she does aquatic exercises 1-3 times per week.  Pain Inventory Average Pain 4 Pain Right Now 3 My pain is intermittent, sharp and stabbing  In the last 24 hours, has pain interfered with the following? General activity 3 Relation with others 4 Enjoyment of life 5 What TIME of day is your pain at its worst? daytime Sleep (in general) Fair  Pain is worse with: sitting and standing Pain improves with: medication Relief from Meds: 6  Mobility Do you have any goals in this area?  no  Function Do you have any goals in this area?  no  Neuro/Psych No problems in this area  Prior Studies Any changes since last visit?  no  Physicians involved in your care Any changes since last visit?  no   Family History  Problem Relation Age of Onset  . Diabetes Mother   . Heart disease Mother   . Heart disease Father   . Heart disease Brother    History   Social History  . Marital Status: Widowed    Spouse Name: N/A    Number of Children: N/A  . Years of Education: N/A   Social History Main Topics  . Smoking status: Former Smoker -- 0.5 packs/day for 5 years    Types: Cigarettes    Quit date: 03/15/1968  . Smokeless tobacco: None  . Alcohol Use: Yes     Comment: occasional glass of wine  . Drug Use: No  . Sexually Active: None   Other Topics Concern  . None   Social History Narrative   . None   Past Surgical History  Procedure Date  . Retinal detachment surgery 9/13   Past Medical History  Diagnosis Date  . Cervical facet syndrome   . Enthesopathy of hip region   . Synovitis and tenosynovitis   . Disorders of sacrum   . Sciatic neuritis   . Depression    BP 144/71  Pulse 79  Resp 14  Ht 5\' 3"  (1.6 m)  Wt 141 lb (63.957 kg)  BMI 24.98 kg/m2  SpO2 97%     Review of Systems  Gastrointestinal: Positive for constipation.  Musculoskeletal: Positive for back pain.  All other systems reviewed and are negative.       Objective:   Physical Exam Constitutional: She is oriented to person, place, and time. She appears well-developed and well-nourished.  HENT:  Head: Normocephalic.  Neck: Neck supple.  Musculoskeletal: She exhibits tenderness.  Neurological: She is alert and oriented to person, place, and time.  Skin: Skin is warm and dry.  Psychiatric: She has a normal mood and affect.  Symmetric normal motor tone is noted throughout, except increased muscle tone in trapezius bilateral. Normal muscle bulk. Muscle testing reveals 5/5 muscle strength of the upper extremity, and 5/5 of the lower extremity. Full range of motion in upper and  lower extremities. ROM of spine is restricted. Fine motor movements are normal in both hands.  DTR in the upper and lower extremity are present and symmetric 2+. No clonus is noted.  Patient arises from chair with mild difficulty. Narrow based gait with normal arm swing bilateral , able to walk on heels and toes . Tandem walk is stable. No pronator drift. Rhomberg negative.        Assessment & Plan:  1. Right buttock pain and post laminectomy syndrome.  2. Left leg discrepancy.  3. New onset left facial numbness -resolved   1. I refilled Percocet 10/325 one p.o. q.6 h. p.r.n., 100 ; and fentanyl to again for now. Consider taper again in the fall or next year. Refilled Trazodone  2. Discussed tapering of  Lyrica,patient should take one tablet less for one week, until d/c, might consider increasing Cymbalta from 30mg  to 60, if pain increases without the Lyrica.  4. Discussed appropriate ROM, stretching, massage, desensitization exercises for her low back, especially water exercises would be very beneficial for her, she is a member in a [employer] now, and has started water aerobics, which is beneficial.I encouraged her to continue.

## 2012-03-31 NOTE — Patient Instructions (Signed)
Continue with exercising in the pool, stay as active as pain permits.

## 2012-04-26 ENCOUNTER — Encounter: Payer: Self-pay | Admitting: Physical Medicine and Rehabilitation

## 2012-04-26 ENCOUNTER — Encounter
Payer: Medicare Other | Attending: Physical Medicine and Rehabilitation | Admitting: Physical Medicine and Rehabilitation

## 2012-04-26 VITALS — BP 153/72 | HR 86 | Resp 14 | Ht 63.0 in | Wt 137.0 lb

## 2012-04-26 DIAGNOSIS — M2170 Unequal limb length (acquired), unspecified site: Secondary | ICD-10-CM | POA: Insufficient documentation

## 2012-04-26 DIAGNOSIS — M961 Postlaminectomy syndrome, not elsewhere classified: Secondary | ICD-10-CM

## 2012-04-26 DIAGNOSIS — IMO0001 Reserved for inherently not codable concepts without codable children: Secondary | ICD-10-CM | POA: Insufficient documentation

## 2012-04-26 DIAGNOSIS — R209 Unspecified disturbances of skin sensation: Secondary | ICD-10-CM | POA: Insufficient documentation

## 2012-04-26 DIAGNOSIS — M533 Sacrococcygeal disorders, not elsewhere classified: Secondary | ICD-10-CM

## 2012-04-26 MED ORDER — OXYCODONE-ACETAMINOPHEN 10-325 MG PO TABS: 1.0000 | ORAL_TABLET | Freq: Four times a day (QID) | ORAL | Status: DC | PRN

## 2012-04-26 MED ORDER — FENTANYL 50 MCG/HR TD PT72: 1.0000 | MEDICATED_PATCH | TRANSDERMAL | Status: DC

## 2012-04-26 NOTE — Progress Notes (Signed)
Subjective:    Patient ID: Alexandria Mclaughlin, female    DOB: 1940-04-15, 72 y.o.   MRN: [id number]  HPI The patient is a 72 year old female female, who presents with chronic low back pain . The symptoms started 14 years ago. Hx of PSF at L4-5, 2004, ,and removal of hardware in 2010. The patient complains about moderate pain , which radiate to her right posterior hip. She describes the pain as stabbing . Applying heat, taking medications , changing positions alleviate the symptoms. Prolonged sitting and standing aggrevates the symptoms. The patient grades her pain as a 4 /10. The patient reports, that she does aquatic exercises 1-3 times per week. Has d/c the Lyrica, because of costs, would like to increase cymbalta for her now increased pain, as we considered last visit.  Pain Inventory Average Pain 5 Pain Right Now 4 My pain is intermittent, sharp and stabbing  In the last 24 hours, has pain interfered with the following? General activity 4 Relation with others 5 Enjoyment of life 5 What TIME of day is your pain at its worst? daytime Sleep (in general) Poor  Pain is worse with: sitting and standing Pain improves with: heat/ice, therapy/exercise and medication Relief from Meds: 5  Mobility Do you have any goals in this area?  no  Function Do you have any goals in this area?  no  Neuro/Psych No problems in this area  Prior Studies Any changes since last visit?  no  Physicians involved in your care Any changes since last visit?  no   Family History  Problem Relation Age of Onset  . Diabetes Mother   . Heart disease Mother   . Heart disease Father   . Heart disease Brother    History   Social History  . Marital Status: Widowed    Spouse Name: N/A    Number of Children: N/A  . Years of Education: N/A   Social History Main Topics  . Smoking status: Former Smoker -- 0.50 packs/day for 5 years    Types: Cigarettes    Quit date: 03/15/1968  . Smokeless tobacco: None  .  Alcohol Use: Yes     Comment: occasional glass of wine  . Drug Use: No  . Sexually Active: None   Other Topics Concern  . None   Social History Narrative  . None   Past Surgical History  Procedure Laterality Date  . Retinal detachment surgery  9/13   Past Medical History  Diagnosis Date  . Cervical facet syndrome   . Enthesopathy of hip region   . Synovitis and tenosynovitis   . Disorders of sacrum   . Sciatic neuritis   . Depression    BP 153/72  Pulse 86  Resp 14  Ht 5\' 3"  (1.6 m)  Wt 137 lb (62.143 kg)  BMI 24.27 kg/m2  SpO2 97%     Review of Systems  Gastrointestinal: Positive for constipation.  Musculoskeletal: Positive for back pain.  All other systems reviewed and are negative.       Objective:   Physical Exam Constitutional: She is oriented to person, place, and time. She appears well-developed and well-nourished.  HENT:  Head: Normocephalic.  Neck: Neck supple.  Musculoskeletal: She exhibits tenderness.  Neurological: She is alert and oriented to person, place, and time.  Skin: Skin is warm and dry.  Psychiatric: She has a normal mood and affect.  Symmetric normal motor tone is noted throughout, except increased muscle tone in trapezius  bilateral. Normal muscle bulk. Muscle testing reveals 5/5 muscle strength of the upper extremity, and 5/5 of the lower extremity. Full range of motion in upper and lower extremities. ROM of spine is restricted. Fine motor movements are normal in both hands.  DTR in the upper and lower extremity are present and symmetric 2+. No clonus is noted.  Patient arises from chair with mild difficulty. Narrow based gait with normal arm swing bilateral , able to walk on heels and toes . Tandem walk is stable. No pronator drift. Rhomberg negative.        Assessment & Plan:  1. Right buttock pain and post laminectomy syndrome.  2. Left leg discrepancy.  3. New onset left facial numbness -resolved  1. I refilled Percocet  10/325 one p.o. q.6 h. p.r.n., 100 ; and fentanyl to again for now. Consider taper again in the fall or next year. Refilled Trazodone  2. Discussed tapering of Lyrica,patient should take one tablet less for one week, until d/c, might consider increasing Cymbalta from 30mg  to 60, if pain increases without the Lyrica.Which has been the case, will increase to 60 mg per day, and see whether this helps.  4. Discussed appropriate ROM, stretching, massage, desensitization exercises for her low back, especially water exercises would be very beneficial for her, she is a member in a [employer] now, and has started water aerobics, which is beneficial.I encouraged her to continue. Follow up in 1 month.

## 2012-04-26 NOTE — Patient Instructions (Signed)
Continue with your aquatic exercises 

## 2012-05-05 ENCOUNTER — Telehealth: Payer: Self-pay

## 2012-05-05 NOTE — Telephone Encounter (Signed)
Patient called and says cymbalta is not working and would like to go back to [employer].  Please advise.

## 2012-05-07 MED ORDER — PREGABALIN 50 MG PO CAPS: 50.0000 mg | ORAL_CAPSULE | Freq: Two times a day (BID) | ORAL | Status: DC

## 2012-05-07 NOTE — Telephone Encounter (Signed)
She can go back on the Lyrica, we have to start with a lower dose and then built up to her former dose if necessary. She should take 50mg  bid for one week, then increase to 100mg  bid, we increased her Cymbalta, when she wanted to be d/c from the Lyrica, when she is on the 100mg  bid dose, she can decrease her Cymbalta back to 30mg  per day. So please put into the med order Lyrica 50mg , # 100, she should take 1 tablet , 50 mg bid for one week, then 2 tablets, 100mg , bid until we see her back to her next visit.

## 2012-05-07 NOTE — Telephone Encounter (Signed)
Patient called again requesting to go back on lyrica.  Please advise.

## 2012-05-07 NOTE — Telephone Encounter (Signed)
Patient given directions on how to taper up on lyrica and down on cymbalta.  She understands and new lyrica script called to pharmacy.

## 2012-05-07 NOTE — Telephone Encounter (Signed)
Spoke with patient and advised her we received her message and will contact her when a response has been reached.

## 2012-05-11 ENCOUNTER — Other Ambulatory Visit: Payer: Self-pay | Admitting: Physical Medicine and Rehabilitation

## 2012-05-20 ENCOUNTER — Encounter: Payer: Self-pay | Admitting: Physical Medicine and Rehabilitation

## 2012-05-20 ENCOUNTER — Encounter
Payer: Medicare Other | Attending: Physical Medicine and Rehabilitation | Admitting: Physical Medicine and Rehabilitation

## 2012-05-20 VITALS — BP 149/63 | HR 74 | Resp 15 | Ht 63.0 in | Wt 140.2 lb

## 2012-05-20 DIAGNOSIS — M961 Postlaminectomy syndrome, not elsewhere classified: Secondary | ICD-10-CM

## 2012-05-20 DIAGNOSIS — M533 Sacrococcygeal disorders, not elsewhere classified: Secondary | ICD-10-CM

## 2012-05-20 DIAGNOSIS — M2170 Unequal limb length (acquired), unspecified site: Secondary | ICD-10-CM

## 2012-05-20 MED ORDER — OXYCODONE-ACETAMINOPHEN 10-325 MG PO TABS: 1.0000 | ORAL_TABLET | Freq: Four times a day (QID) | ORAL | Status: DC | PRN

## 2012-05-20 MED ORDER — ZOLPIDEM TARTRATE 10 MG PO TABS: ORAL_TABLET | ORAL | Status: DC

## 2012-05-20 MED ORDER — TRAZODONE HCL 100 MG PO TABS: 100.0000 mg | ORAL_TABLET | Freq: Every day | ORAL | Status: DC

## 2012-05-20 MED ORDER — FENTANYL 50 MCG/HR TD PT72: 1.0000 | MEDICATED_PATCH | TRANSDERMAL | Status: DC

## 2012-05-20 NOTE — Progress Notes (Signed)
Subjective:    Patient ID: Alexandria Mclaughlin, female    DOB: 08/18/1940, 72 y.o.   MRN: [id number]  HPI The patient is a 72 year old female female, who presents with chronic low back pain . The symptoms started 14 years ago. Hx of PSF at L4-5, 2004, ,and removal of hardware in 2010. The patient complains about moderate pain , which radiate to her right posterior hip. She describes the pain as stabbing . Applying heat, taking medications , changing positions alleviate the symptoms. Prolonged sitting and standing aggrevates the symptoms. The patient grades her pain as a 4 /10. The patient reports, that she does aquatic exercises 1-3 times per week. She is back on her Lyrica,100mg  bid, and on her Cymbalta 30mg /day, she tried to d/c the lyrica and increase her Cymbalta, because of the costs, but that did not give her enough pain relief, therefore she is back on her former dose.  She states, that she is flying to [place] in 2 days, and needs her refills early, because she will be out of her medication during her trip.  Pain Inventory Average Pain 5 Pain Right Now 4 My pain is intermittent, sharp and stabbing  In the last 24 hours, has pain interfered with the following? General activity 4 Relation with others 4 Enjoyment of life 5 What TIME of day is your pain at its worst? daytime Sleep (in general) Fair  Pain is worse with: sitting and standing Pain improves with: therapy/exercise and medication Relief from Meds: 5  Mobility Do you have any goals in this area?  no  Function Do you have any goals in this area?  no  Neuro/Psych No problems in this area  Prior Studies Any changes since last visit?  no  Physicians involved in your care Any changes since last visit?  no   Family History  Problem Relation Age of Onset  . Diabetes Mother   . Heart disease Mother   . Heart disease Father   . Heart disease Brother    History   Social History  . Marital Status: Widowed    Spouse  Name: N/A    Number of Children: N/A  . Years of Education: N/A   Social History Main Topics  . Smoking status: Former Smoker -- 0.50 packs/day for 5 years    Types: Cigarettes    Quit date: 03/15/1968  . Smokeless tobacco: None  . Alcohol Use: Yes     Comment: occasional glass of wine  . Drug Use: No  . Sexually Active: None   Other Topics Concern  . None   Social History Narrative  . None   Past Surgical History  Procedure Laterality Date  . Retinal detachment surgery  9/13   Past Medical History  Diagnosis Date  . Cervical facet syndrome   . Enthesopathy of hip region   . Synovitis and tenosynovitis   . Disorders of sacrum   . Sciatic neuritis   . Depression    BP 149/63  Pulse 74  Resp 15  Ht 5\' 3"  (1.6 m)  Wt 140 lb 3.2 oz (63.594 kg)  BMI 24.84 kg/m2  SpO2 98%      Review of Systems  All other systems reviewed and are negative.       Objective:   Physical Exam Constitutional: She is oriented to person, place, and time. She appears well-developed and well-nourished.  HENT:  Head: Normocephalic.  Neck: Neck supple.  Musculoskeletal: She exhibits tenderness.  Neurological: She is alert and oriented to person, place, and time.  Skin: Skin is warm and dry.  Psychiatric: She has a normal mood and affect.  Symmetric normal motor tone is noted throughout, except increased muscle tone in trapezius bilateral. Normal muscle bulk. Muscle testing reveals 5/5 muscle strength of the upper extremity, and 5/5 of the lower extremity. Full range of motion in upper and lower extremities. ROM of spine is restricted. Fine motor movements are normal in both hands.  DTR in the upper and lower extremity are present and symmetric 2+. No clonus is noted.  Patient arises from chair with mild difficulty. Narrow based gait with normal arm swing bilateral , able to walk on heels and toes . Tandem walk is stable. No pronator drift. Rhomberg negative.        Assessment &  Plan:  1. Right buttock pain and post laminectomy syndrome.  2. Left leg discrepancy.  3. New onset left facial numbness -resolved  1. I refilled Percocet 10/325 one p.o. q.6 h. p.r.n., 100 ; and fentanyl to again for now. Consider taper again in the fall or next year. Refilled Trazodone and Ambien, discussed to try to wean down on the Ambien, consider referral to sleep specialist when patient comes back from her trip. She is flying to [place] in 2 days, and needs her refills early, because she will be out of her medication during her trip.  2. Patient is taking  Lyrica 100mg  bid, and Cymbalta  30mg  a day 4. Discussed appropriate ROM, stretching, massage, desensitization exercises for her low back, especially water exercises would be very beneficial for her, she is a member in a [employer] now, and has started water aerobics, which is beneficial.I encouraged her to continue.  Follow up in 1 month.

## 2012-05-20 NOTE — Patient Instructions (Signed)
Continue with your aquatic exercises 

## 2012-06-04 ENCOUNTER — Ambulatory Visit: Payer: Medicare Other | Admitting: Physical Medicine and Rehabilitation

## 2012-06-04 ENCOUNTER — Telehealth: Payer: Self-pay

## 2012-06-04 MED ORDER — PREGABALIN 100 MG PO CAPS: 100.0000 mg | ORAL_CAPSULE | Freq: Two times a day (BID) | ORAL | Status: DC

## 2012-06-04 NOTE — Telephone Encounter (Signed)
That is right, she tried to wean down, but is now back on 100mg  bid, # 60/month

## 2012-06-04 NOTE — Telephone Encounter (Signed)
Lyrica called to pharmacy.  Patient aware.

## 2012-06-04 NOTE — Telephone Encounter (Signed)
Patient called requesting lyrica refill.  Please advise directions.  Her message says something about 100mg .  Please advise.

## 2012-06-10 ENCOUNTER — Other Ambulatory Visit: Payer: Self-pay | Admitting: Physical Medicine and Rehabilitation

## 2012-06-14 ENCOUNTER — Other Ambulatory Visit: Payer: Self-pay | Admitting: Physical Medicine and Rehabilitation

## 2012-06-15 ENCOUNTER — Other Ambulatory Visit: Payer: Self-pay | Admitting: Physical Medicine and Rehabilitation

## 2012-06-17 ENCOUNTER — Encounter
Payer: Medicare Other | Attending: Physical Medicine and Rehabilitation | Admitting: Physical Medicine and Rehabilitation

## 2012-06-17 ENCOUNTER — Encounter: Payer: Self-pay | Admitting: Physical Medicine and Rehabilitation

## 2012-06-17 VITALS — BP 149/81 | HR 61 | Resp 14 | Ht 63.0 in | Wt 138.0 lb

## 2012-06-17 DIAGNOSIS — M961 Postlaminectomy syndrome, not elsewhere classified: Secondary | ICD-10-CM

## 2012-06-17 DIAGNOSIS — M533 Sacrococcygeal disorders, not elsewhere classified: Secondary | ICD-10-CM

## 2012-06-17 DIAGNOSIS — M2170 Unequal limb length (acquired), unspecified site: Secondary | ICD-10-CM

## 2012-06-17 DIAGNOSIS — Z5181 Encounter for therapeutic drug level monitoring: Secondary | ICD-10-CM

## 2012-06-17 DIAGNOSIS — Z79899 Other long term (current) drug therapy: Secondary | ICD-10-CM

## 2012-06-17 MED ORDER — FENTANYL 50 MCG/HR TD PT72: 1.0000 | MEDICATED_PATCH | TRANSDERMAL | Status: DC

## 2012-06-17 MED ORDER — OXYCODONE-ACETAMINOPHEN 10-325 MG PO TABS: 1.0000 | ORAL_TABLET | Freq: Four times a day (QID) | ORAL | Status: DC | PRN

## 2012-06-17 NOTE — Patient Instructions (Signed)
Continue with your aquatic exercising.

## 2012-06-17 NOTE — Progress Notes (Signed)
Subjective:    Patient ID: Alexandria Mclaughlin, female    DOB: 10-Mar-1940, 72 y.o.   MRN: [id number]  HPI The patient is a 72 year old female female, who presents with chronic low back pain . The symptoms started 14 years ago. Hx of PSF at L4-5, 2004, ,and removal of hardware in 2010. The patient complains about moderate pain , which radiate to her right posterior hip. She describes the pain as stabbing . Applying heat, taking medications , changing positions alleviate the symptoms. Prolonged sitting and standing aggrevates the symptoms. The patient grades her pain as a 4 /10. The patient reports, that she does aquatic exercises 1-3 times per week. She is back on her Lyrica,100mg  bid, and on her Cymbalta 30mg /day, she tried to d/c the lyrica and increase her Cymbalta, because of the costs, but that did not give her enough pain relief, therefore she is back on her former dose.    Pain Inventory Average Pain 4 Pain Right Now 4 My pain is intermittent, sharp and stabbing  In the last 24 hours, has pain interfered with the following? General activity 4 Relation with others 4 Enjoyment of life 4 What TIME of day is your pain at its worst? daytime Sleep (in general) Fair  Pain is worse with: sitting and standing Pain improves with: medication Relief from Meds: 4  Mobility Do you have any goals in this area?  no  Function Do you have any goals in this area?  no  Neuro/Psych No problems in this area  Prior Studies Any changes since last visit?  no  Physicians involved in your care Any changes since last visit?  no   Family History  Problem Relation Age of Onset  . Diabetes Mother   . Heart disease Mother   . Heart disease Father   . Heart disease Brother    History   Social History  . Marital Status: Widowed    Spouse Name: N/A    Number of Children: N/A  . Years of Education: N/A   Social History Main Topics  . Smoking status: Former Smoker -- 0.50 packs/day for 5 years     Types: Cigarettes    Quit date: 03/15/1968  . Smokeless tobacco: None  . Alcohol Use: Yes     Comment: occasional glass of wine  . Drug Use: No  . Sexually Active: None   Other Topics Concern  . None   Social History Narrative  . None   Past Surgical History  Procedure Laterality Date  . Retinal detachment surgery  9/13   Past Medical History  Diagnosis Date  . Cervical facet syndrome   . Enthesopathy of hip region   . Synovitis and tenosynovitis   . Disorders of sacrum   . Sciatic neuritis   . Depression    BP 149/81  Pulse 61  Resp 14  Ht 5\' 3"  (1.6 m)  Wt 138 lb (62.596 kg)  BMI 24.45 kg/m2  SpO2 96%     Review of Systems  Musculoskeletal: Positive for back pain.  All other systems reviewed and are negative.       Objective:   Physical Exam Constitutional: She is oriented to person, place, and time. She appears well-developed and well-nourished.  HENT:  Head: Normocephalic.  Neck: Neck supple.  Musculoskeletal: She exhibits tenderness.  Neurological: She is alert and oriented to person, place, and time.  Skin: Skin is warm and dry.  Psychiatric: She has a normal mood and  affect.  Symmetric normal motor tone is noted throughout, except increased muscle tone in trapezius bilateral. Normal muscle bulk. Muscle testing reveals 5/5 muscle strength of the upper extremity, and 5/5 of the lower extremity. Full range of motion in upper and lower extremities. ROM of spine is restricted. Fine motor movements are normal in both hands.  DTR in the upper and lower extremity are present and symmetric 2+. No clonus is noted.  Patient arises from chair with mild difficulty. Narrow based gait with normal arm swing bilateral , able to walk on heels and toes . Tandem walk is stable. No pronator drift. Rhomberg negative.        Assessment & Plan:  1. Right buttock pain and post laminectomy syndrome.  2. Left leg discrepancy.  3. New onset left facial numbness -resolved   1. I refilled Percocet 10/325 one p.o. q.6 h. p.r.n., 100 ; and fentanyl to again for now. Consider taper again in the fall or next year. Refilled Trazodone and Ambien, discussed to try to wean down on the Ambien, consider referral to sleep specialist when patient comes back from her trip. She is flying to [place] in 2 days, and needs her refills early, because she will be out of her medication during her trip.  2. Patient is taking Lyrica 100mg  bid, and Cymbalta 30mg  a day  4. Discussed appropriate ROM, stretching, massage, desensitization exercises for her low back, especially water exercises would be very beneficial for her, she is a member in a [employer] now, and has started water aerobics, which is beneficial.I encouraged her to continue.  Follow up in 1 month.

## 2012-06-26 ENCOUNTER — Encounter: Payer: Self-pay | Admitting: Physical Medicine and Rehabilitation

## 2012-07-09 ENCOUNTER — Other Ambulatory Visit: Payer: Self-pay | Admitting: Physical Medicine and Rehabilitation

## 2012-07-14 ENCOUNTER — Encounter: Payer: Self-pay | Admitting: Physical Medicine and Rehabilitation

## 2012-07-14 ENCOUNTER — Encounter
Payer: Medicare Other | Attending: Physical Medicine and Rehabilitation | Admitting: Physical Medicine and Rehabilitation

## 2012-07-14 VITALS — BP 146/73 | HR 84 | Resp 14 | Ht 63.0 in | Wt 138.0 lb

## 2012-07-14 DIAGNOSIS — M2170 Unequal limb length (acquired), unspecified site: Secondary | ICD-10-CM

## 2012-07-14 DIAGNOSIS — IMO0001 Reserved for inherently not codable concepts without codable children: Secondary | ICD-10-CM | POA: Insufficient documentation

## 2012-07-14 DIAGNOSIS — M961 Postlaminectomy syndrome, not elsewhere classified: Secondary | ICD-10-CM

## 2012-07-14 DIAGNOSIS — Z981 Arthrodesis status: Secondary | ICD-10-CM | POA: Insufficient documentation

## 2012-07-14 DIAGNOSIS — M545 Low back pain, unspecified: Secondary | ICD-10-CM | POA: Insufficient documentation

## 2012-07-14 DIAGNOSIS — G8929 Other chronic pain: Secondary | ICD-10-CM | POA: Insufficient documentation

## 2012-07-14 DIAGNOSIS — M533 Sacrococcygeal disorders, not elsewhere classified: Secondary | ICD-10-CM

## 2012-07-14 MED ORDER — DULOXETINE HCL 30 MG PO CPEP: ORAL_CAPSULE | ORAL | Status: DC

## 2012-07-14 MED ORDER — FENTANYL 50 MCG/HR TD PT72: 1.0000 | MEDICATED_PATCH | TRANSDERMAL | Status: DC

## 2012-07-14 MED ORDER — OXYCODONE-ACETAMINOPHEN 10-325 MG PO TABS: 1.0000 | ORAL_TABLET | Freq: Four times a day (QID) | ORAL | Status: DC | PRN

## 2012-07-14 NOTE — Patient Instructions (Signed)
Continue with aquatic exercises.

## 2012-07-14 NOTE — Progress Notes (Addendum)
Subjective:    Patient ID: Alexandria Mclaughlin, female    DOB: [birth year], 72 y.o.   MRN: [id number]  HPI The patient is a 72 year old female female, who presents with chronic low back pain . The symptoms started 14 years ago. Hx of PSF at L4-5, 2004, ,and removal of hardware in 2010. The patient complains about moderate pain , which radiate to her right posterior hip. She describes the pain as stabbing . Applying heat, taking medications , changing positions alleviate the symptoms. Prolonged sitting and standing aggrevates the symptoms. The patient grades her pain as a 4 /10. The patient reports, that she does aquatic exercises 1-3 times per week. She is back on her Lyrica,100mg  bid, and on her Cymbalta 30mg /day, she tried to d/c the lyrica and increase her Cymbalta, because of the costs, but that did not give her enough pain relief, therefore she is back on her former dose.   Pain Inventory Average Pain 4 Pain Right Now 4 My pain is intermittent, sharp and stabbing  In the last 24 hours, has pain interfered with the following? General activity 4 Relation with others 4 Enjoyment of life 4 What TIME of day is your pain at its worst? daytime Sleep (in general) Fair  Pain is worse with: sitting, inactivity and standing Pain improves with: therapy/exercise and medication Relief from Meds: 5  Mobility Do you have any goals in this area?  no  Function Do you have any goals in this area?  no  Neuro/Psych No problems in this area  Prior Studies Any changes since last visit?  no  Physicians involved in your care Any changes since last visit?  no   Family History  Problem Relation Age of Onset  . Diabetes Mother   . Heart disease Mother   . Heart disease Father   . Heart disease Brother    History   Social History  . Marital Status: Widowed    Spouse Name: N/A    Number of Children: N/A  . Years of Education: N/A   Social History Main Topics  . Smoking status: Former Smoker --  0.50 packs/day for 5 years    Types: Cigarettes    Quit date: 03/15/1968  . Smokeless tobacco: None  . Alcohol Use: Yes     Comment: occasional glass of wine  . Drug Use: No  . Sexually Active: None   Other Topics Concern  . None   Social History Narrative  . None   Past Surgical History  Procedure Laterality Date  . Retinal detachment surgery  9/13   Past Medical History  Diagnosis Date  . Cervical facet syndrome   . Enthesopathy of hip region   . Synovitis and tenosynovitis   . Disorders of sacrum   . Sciatic neuritis   . Depression    BP 146/73  Pulse 84  Resp 14  Ht 5\' 3"  (1.6 m)  Wt 138 lb (62.596 kg)  BMI 24.45 kg/m2  SpO2 97%     Review of Systems  Musculoskeletal: Positive for back pain.  All other systems reviewed and are negative.       Objective:   Physical Exam Constitutional: She is oriented to person, place, and time. She appears well-developed and well-nourished.  HENT:  Head: Normocephalic.  Neck: Neck supple.  Musculoskeletal: She exhibits tenderness.  Neurological: She is alert and oriented to person, place, and time.  Skin: Skin is warm and dry.  Psychiatric: She has a normal  mood and affect.  Symmetric normal motor tone is noted throughout, except increased muscle tone in trapezius bilateral. Normal muscle bulk. Muscle testing reveals 5/5 muscle strength of the upper extremity, and 5/5 of the lower extremity. Full range of motion in upper and lower extremities. ROM of spine is restricted. Fine motor movements are normal in both hands.  DTR in the upper and lower extremity are present and symmetric 2+. No clonus is noted.  Patient arises from chair with mild difficulty. Narrow based gait with normal arm swing bilateral , able to walk on heels and toes . Tandem walk is stable. No pronator drift. Rhomberg negative.        Assessment & Plan:  1. Right buttock pain and post laminectomy syndrome.  2. Left leg discrepancy.  3. New onset  left facial numbness -resolved  1. I refilled Percocet 10/325 one p.o. q.6 h. p.r.n., 100 ; and fentanyl to again for now. Consider taper again in the fall or next year. Refilled Trazodone and Ambien, discussed to try to wean down on the Ambien, consider referral to sleep specialist when patient comes back from her trip. She is flying to [place] in 2 days, and needs her refills early, because she will be out of her medication during her trip.  2. Patient is taking Lyrica 100mg  bid, and Cymbalta 30mg  a day. She wants to try gabapentin, when she has finished her last Lyrica bottle, because of financial reasons. She will call us before she finishes the Lyrica, and we will order the gabapentin then.  4. Discussed appropriate ROM, stretching, massage, desensitization exercises for her low back, especially water exercises would be very beneficial for her, she is a member in a [employer] now, and has started water aerobics, which is beneficial.I encouraged her to continue.  Follow up in 1 month.  Patient called she has finished her Lyrica, and did only take one tablet of the 100mg  per day, she wants to switch to Gabapentin, I send her the instructions how to start the gabapentin and that she should stop the Lyrica per e-mail.  I wrote to her, that she should take one tablet of the Gabapentin 300mg  at bedtime for 3 days, then add another tablet in the am, and take those 2 for another 3 days, then if tolerated she can add the third tablet at noon, so she is taking the Gabapentin 300mg  tid. And that she should stop the Lyrica, not take both of those medications ! I also wrote in the email to her, that if she does not tolerate the medication she should not take it, also if she has any questions she should call our office.

## 2012-07-24 ENCOUNTER — Encounter: Payer: Self-pay | Admitting: Physical Medicine and Rehabilitation

## 2012-07-31 ENCOUNTER — Encounter: Payer: Self-pay | Admitting: Physical Medicine and Rehabilitation

## 2012-08-02 MED ORDER — GABAPENTIN 300 MG PO CAPS: 300.0000 mg | ORAL_CAPSULE | Freq: Three times a day (TID) | ORAL | Status: DC

## 2012-08-02 NOTE — Addendum Note (Signed)
Addended by: Su Monks on: 08/02/2012 03:19 PM   Modules accepted: Orders, Medications

## 2012-08-09 ENCOUNTER — Encounter
Payer: Medicare Other | Attending: Physical Medicine and Rehabilitation | Admitting: Physical Medicine and Rehabilitation

## 2012-08-09 ENCOUNTER — Encounter: Payer: Self-pay | Admitting: Physical Medicine and Rehabilitation

## 2012-08-11 ENCOUNTER — Encounter
Payer: Medicare Other | Attending: Physical Medicine and Rehabilitation | Admitting: Physical Medicine and Rehabilitation

## 2012-08-11 ENCOUNTER — Encounter: Payer: Self-pay | Admitting: Physical Medicine and Rehabilitation

## 2012-08-11 VITALS — BP 118/66 | HR 78 | Resp 14 | Ht 63.0 in | Wt 142.0 lb

## 2012-08-11 DIAGNOSIS — R209 Unspecified disturbances of skin sensation: Secondary | ICD-10-CM | POA: Insufficient documentation

## 2012-08-11 DIAGNOSIS — IMO0001 Reserved for inherently not codable concepts without codable children: Secondary | ICD-10-CM | POA: Insufficient documentation

## 2012-08-11 DIAGNOSIS — M25559 Pain in unspecified hip: Secondary | ICD-10-CM | POA: Insufficient documentation

## 2012-08-11 DIAGNOSIS — M533 Sacrococcygeal disorders, not elsewhere classified: Secondary | ICD-10-CM

## 2012-08-11 DIAGNOSIS — M961 Postlaminectomy syndrome, not elsewhere classified: Secondary | ICD-10-CM

## 2012-08-11 DIAGNOSIS — M545 Low back pain, unspecified: Secondary | ICD-10-CM | POA: Insufficient documentation

## 2012-08-11 DIAGNOSIS — G8929 Other chronic pain: Secondary | ICD-10-CM | POA: Insufficient documentation

## 2012-08-11 DIAGNOSIS — M2170 Unequal limb length (acquired), unspecified site: Secondary | ICD-10-CM | POA: Insufficient documentation

## 2012-08-11 MED ORDER — OXYCODONE-ACETAMINOPHEN 10-325 MG PO TABS: 1.0000 | ORAL_TABLET | Freq: Four times a day (QID) | ORAL | Status: DC | PRN

## 2012-08-11 MED ORDER — ZOLPIDEM TARTRATE 5 MG PO TABS: 5.0000 mg | ORAL_TABLET | Freq: Every evening | ORAL | Status: DC | PRN

## 2012-08-11 MED ORDER — FENTANYL 50 MCG/HR TD PT72: 1.0000 | MEDICATED_PATCH | TRANSDERMAL | Status: DC

## 2012-08-11 NOTE — Progress Notes (Signed)
Subjective:    Patient ID: Alexandria Mclaughlin, female    DOB: 1940/04/09, 72 y.o.   MRN: [id number]  HPI The patient is a 72 year old female female, who presents with chronic low back pain . The symptoms started 14 years ago. Hx of PSF at L4-5, 2004, ,and removal of hardware in 2010. The patient complains about moderate pain , which radiate to her right posterior hip. She describes the pain as stabbing . Applying heat, taking medications , changing positions alleviate the symptoms. Prolonged sitting and standing aggrevates the symptoms. The patient grades her pain as a 4 /10. The patient reports, that she does aquatic exercises 1-3 times per week. She is taking Gabapentin 300mg  tid now, we d/c the Lyrica after the last visit, mainly because it was more expensive.  She reports that the gabapentin is working well for her.    Pain Inventory Average Pain 5 Pain Right Now 4 My pain is intermittent, sharp and stabbing  In the last 24 hours, has pain interfered with the following? General activity 4 Relation with others 4 Enjoyment of life 4 What TIME of day is your pain at its worst? daytime Sleep (in general) Fair  Pain is worse with: sitting, inactivity and standing Pain improves with: therapy/exercise and medication Relief from Meds: 5  Mobility Do you have any goals in this area?  no  Function Do you have any goals in this area?  no  Neuro/Psych No problems in this area  Prior Studies Any changes since last visit?  no  Physicians involved in your care Any changes since last visit?  no   Family History  Problem Relation Age of Onset  . Diabetes Mother   . Heart disease Mother   . Heart disease Father   . Heart disease Brother    History   Social History  . Marital Status: Widowed    Spouse Name: N/A    Number of Children: N/A  . Years of Education: N/A   Social History Main Topics  . Smoking status: Former Smoker -- 0.50 packs/day for 5 years    Types: Cigarettes    Quit  date: 03/15/1968  . Smokeless tobacco: None  . Alcohol Use: Yes     Comment: occasional glass of wine  . Drug Use: No  . Sexually Active: None   Other Topics Concern  . None   Social History Narrative  . None   Past Surgical History  Procedure Laterality Date  . Retinal detachment surgery  9/13   Past Medical History  Diagnosis Date  . Cervical facet syndrome   . Enthesopathy of hip region   . Synovitis and tenosynovitis   . Disorders of sacrum   . Sciatic neuritis   . Depression    BP 118/66  Pulse 78  Resp 14  Ht 5\' 3"  (1.6 m)  Wt 142 lb (64.411 kg)  BMI 25.16 kg/m2  SpO2 98%     Review of Systems  Musculoskeletal: Positive for back pain.  All other systems reviewed and are negative.       Objective:   Physical Exam Constitutional: She is oriented to person, place, and time. She appears well-developed and well-nourished.  HENT:  Head: Normocephalic.  Neck: Neck supple.  Musculoskeletal: She exhibits tenderness.  Neurological: She is alert and oriented to person, place, and time.  Skin: Skin is warm and dry.  Psychiatric: She has a normal mood and affect.  Symmetric normal motor tone is noted  throughout, except increased muscle tone in trapezius bilateral. Normal muscle bulk. Muscle testing reveals 5/5 muscle strength of the upper extremity, and 5/5 of the lower extremity. Full range of motion in upper and lower extremities. ROM of spine is restricted. Fine motor movements are normal in both hands.  DTR in the upper and lower extremity are present and symmetric 2+. No clonus is noted.  Patient arises from chair with mild difficulty. Narrow based gait with normal arm swing bilateral , able to walk on heels and toes . Tandem walk is stable. No pronator drift. Rhomberg negative.        Assessment & Plan:  1. Right buttock pain and post laminectomy syndrome.  2. Left leg discrepancy.  3. New onset left facial numbness -resolved  1. I refilled Percocet  10/325 one p.o. q.6 h. p.r.n., 100 ; and fentanyl to again for now. Consider taper again in the fall or next year.  Discussed to try to wean down on the Ambien, consider referral to sleep specialist. Decreased the ambien from 10mg  to 5mg  today and gave her a prescription. 2. Patient is taking Gabapentin, 300mg  tid, this works well for her, she did not notice any adverse effects. We d/c Lyrica, because of financial reasons.   4. Discussed appropriate ROM, stretching, massage, desensitization exercises for her low back, especially water exercises would be very beneficial for her, she is a member in a [employer] now, and has started water aerobics, which is beneficial.I encouraged her to continue.  Follow up in 1 month.

## 2012-08-11 NOTE — Patient Instructions (Signed)
Continue with your aquatic exercising 

## 2012-08-15 ENCOUNTER — Other Ambulatory Visit: Payer: Self-pay | Admitting: Physical Medicine and Rehabilitation

## 2012-08-15 ENCOUNTER — Encounter: Payer: Self-pay | Admitting: Physical Medicine and Rehabilitation

## 2012-08-16 ENCOUNTER — Telehealth: Payer: Self-pay | Admitting: *Deleted

## 2012-08-16 MED ORDER — DULOXETINE HCL 30 MG PO CPEP: ORAL_CAPSULE | ORAL | Status: DC

## 2012-08-16 NOTE — Telephone Encounter (Signed)
Refill on Cymbalta. Notified Mrs Nield.

## 2012-09-10 ENCOUNTER — Encounter: Payer: Self-pay | Admitting: Physical Medicine and Rehabilitation

## 2012-09-10 ENCOUNTER — Encounter
Payer: Medicare Other | Attending: Physical Medicine and Rehabilitation | Admitting: Physical Medicine and Rehabilitation

## 2012-09-10 VITALS — BP 137/74 | HR 79 | Resp 14 | Ht 63.0 in | Wt 138.0 lb

## 2012-09-10 DIAGNOSIS — M2170 Unequal limb length (acquired), unspecified site: Secondary | ICD-10-CM | POA: Insufficient documentation

## 2012-09-10 DIAGNOSIS — G8929 Other chronic pain: Secondary | ICD-10-CM | POA: Insufficient documentation

## 2012-09-10 DIAGNOSIS — IMO0001 Reserved for inherently not codable concepts without codable children: Secondary | ICD-10-CM | POA: Insufficient documentation

## 2012-09-10 DIAGNOSIS — M961 Postlaminectomy syndrome, not elsewhere classified: Secondary | ICD-10-CM | POA: Insufficient documentation

## 2012-09-10 DIAGNOSIS — M533 Sacrococcygeal disorders, not elsewhere classified: Secondary | ICD-10-CM

## 2012-09-10 MED ORDER — OXYCODONE-ACETAMINOPHEN 10-325 MG PO TABS: 1.0000 | ORAL_TABLET | Freq: Four times a day (QID) | ORAL | Status: DC | PRN

## 2012-09-10 MED ORDER — FENTANYL 50 MCG/HR TD PT72: 1.0000 | MEDICATED_PATCH | TRANSDERMAL | Status: DC

## 2012-09-10 MED ORDER — TRAZODONE HCL 100 MG PO TABS: ORAL_TABLET | ORAL | Status: DC

## 2012-09-10 NOTE — Patient Instructions (Signed)
Stay as active as pain permits. Continue with your aquatic exercises

## 2012-09-10 NOTE — Progress Notes (Signed)
Subjective:    Patient ID: Alexandria Mclaughlin, female    DOB: Mar 17, 1940, 72 y.o.   MRN: [id number]  HPI The patient is a 72 year old female female, who presents with chronic low back pain . The symptoms started 14 years ago. Hx of PSF at L4-5, 2004, ,and removal of hardware in 2010. The patient complains about moderate pain , which radiate to her right posterior hip. She describes the pain as stabbing . Applying heat, taking medications , changing positions alleviate the symptoms. Prolonged sitting and standing aggrevates the symptoms. The patient grades her pain as a 4 /10. The patient reports, that she does aquatic exercises 1-3 times per week. She is taking Gabapentin 300mg  tid now, we d/c the Lyrica after the last visit, mainly because it was more expensive. She reports that the gabapentin is working well for her.   Pain Inventory Average Pain 4 Pain Right Now 4 My pain is intermittent, sharp and stabbing  In the last 24 hours, has pain interfered with the following? General activity 4 Relation with others 4 Enjoyment of life 4 What TIME of day is your pain at its worst? daytime Sleep (in general) Fair  Pain is worse with: sitting and standing Pain improves with: therapy/exercise and medication Relief from Meds: 5  Mobility Do you have any goals in this area?  no  Function Do you have any goals in this area?  no  Neuro/Psych No problems in this area  Prior Studies Any changes since last visit?  no  Physicians involved in your care Any changes since last visit?  no   Family History  Problem Relation Age of Onset  . Diabetes Mother   . Heart disease Mother   . Heart disease Father   . Heart disease Brother    History   Social History  . Marital Status: Widowed    Spouse Name: N/A    Number of Children: N/A  . Years of Education: N/A   Social History Main Topics  . Smoking status: Former Smoker -- 0.50 packs/day for 5 years    Types: Cigarettes    Quit date:  03/15/1968  . Smokeless tobacco: None  . Alcohol Use: Yes     Comment: occasional glass of wine  . Drug Use: No  . Sexually Active: None   Other Topics Concern  . None   Social History Narrative  . None   Past Surgical History  Procedure Laterality Date  . Retinal detachment surgery  9/13   Past Medical History  Diagnosis Date  . Cervical facet syndrome   . Enthesopathy of hip region   . Synovitis and tenosynovitis   . Disorders of sacrum   . Sciatic neuritis   . Depression    BP 137/74  Pulse 79  Resp 14  Ht 5\' 3"  (1.6 m)  Wt 138 lb (62.596 kg)  BMI 24.45 kg/m2  SpO2 93%     Review of Systems  Musculoskeletal: Positive for myalgias and arthralgias.  All other systems reviewed and are negative.       Objective:   Physical Exam Constitutional: She is oriented to person, place, and time. She appears well-developed and well-nourished.  HENT:  Head: Normocephalic.  Neck: Neck supple.  Musculoskeletal: She exhibits tenderness.  Neurological: She is alert and oriented to person, place, and time.  Skin: Skin is warm and dry.  Psychiatric: She has a normal mood and affect.  Symmetric normal motor tone is noted throughout, except  increased muscle tone in trapezius bilateral. Normal muscle bulk. Muscle testing reveals 5/5 muscle strength of the upper extremity, and 5/5 of the lower extremity. Full range of motion in upper and lower extremities. ROM of spine is restricted. Fine motor movements are normal in both hands.  DTR in the upper and lower extremity are present and symmetric 2+. No clonus is noted.  Patient arises from chair with mild difficulty. Narrow based gait with normal arm swing bilateral , able to walk on heels and toes . Tandem walk is stable. No pronator drift. Rhomberg negative        Assessment & Plan:  1. Right buttock pain and post laminectomy syndrome.  2. Left leg discrepancy.  3. New onset left facial numbness -resolved  1. I refilled  Percocet 10/325 one p.o. q.6 h. p.r.n., 100 ; and fentanyl to again for now. Consider taper again in the fall or next year.  Discussed to try to wean down on the Ambien, consider referral to sleep specialist. Decreased the ambien from 10mg  to 5mg  today and gave her a prescription.  2. Patient is taking Gabapentin, 300mg  tid, this works well for her, she did not notice any adverse effects. We d/c Lyrica, because of financial reasons.  4. Discussed appropriate ROM, stretching, massage, desensitization exercises for her low back, especially water exercises would be very beneficial for her, she is a member in a [employer] now, and has started water aerobics, which is beneficial.I encouraged her to continue.  Follow up in 1 month.

## 2012-09-20 ENCOUNTER — Other Ambulatory Visit: Payer: Self-pay

## 2012-09-20 MED ORDER — TRAZODONE HCL 100 MG PO TABS: ORAL_TABLET | ORAL | Status: DC

## 2012-09-29 ENCOUNTER — Other Ambulatory Visit: Payer: Self-pay

## 2012-10-06 ENCOUNTER — Encounter
Payer: Medicare Other | Attending: Physical Medicine and Rehabilitation | Admitting: Physical Medicine and Rehabilitation

## 2012-10-06 ENCOUNTER — Encounter: Payer: Self-pay | Admitting: Physical Medicine and Rehabilitation

## 2012-10-06 VITALS — BP 137/66 | HR 80 | Resp 16 | Ht 63.0 in | Wt 139.0 lb

## 2012-10-06 DIAGNOSIS — R209 Unspecified disturbances of skin sensation: Secondary | ICD-10-CM | POA: Insufficient documentation

## 2012-10-06 DIAGNOSIS — G8929 Other chronic pain: Secondary | ICD-10-CM | POA: Insufficient documentation

## 2012-10-06 DIAGNOSIS — M533 Sacrococcygeal disorders, not elsewhere classified: Secondary | ICD-10-CM

## 2012-10-06 DIAGNOSIS — M2170 Unequal limb length (acquired), unspecified site: Secondary | ICD-10-CM

## 2012-10-06 DIAGNOSIS — M545 Low back pain, unspecified: Secondary | ICD-10-CM | POA: Insufficient documentation

## 2012-10-06 DIAGNOSIS — Z981 Arthrodesis status: Secondary | ICD-10-CM | POA: Insufficient documentation

## 2012-10-06 DIAGNOSIS — IMO0001 Reserved for inherently not codable concepts without codable children: Secondary | ICD-10-CM | POA: Insufficient documentation

## 2012-10-06 DIAGNOSIS — M961 Postlaminectomy syndrome, not elsewhere classified: Secondary | ICD-10-CM

## 2012-10-06 MED ORDER — OXYCODONE-ACETAMINOPHEN 10-325 MG PO TABS: 1.0000 | ORAL_TABLET | Freq: Four times a day (QID) | ORAL | Status: DC | PRN

## 2012-10-06 MED ORDER — FENTANYL 50 MCG/HR TD PT72: 1.0000 | MEDICATED_PATCH | TRANSDERMAL | Status: DC

## 2012-10-06 MED ORDER — ZOLPIDEM TARTRATE 5 MG PO TABS: 5.0000 mg | ORAL_TABLET | Freq: Every evening | ORAL | Status: DC | PRN

## 2012-10-06 NOTE — Patient Instructions (Signed)
Continue with your aquatic exercises 

## 2012-10-06 NOTE — Progress Notes (Signed)
Subjective:    Patient ID: Alexandria Mclaughlin, female    DOB: 1940/05/02, 72 y.o.   MRN: [id number]  HPI The patient is a 72 year old female female, who presents with chronic low back pain . The symptoms started 14 years ago. Hx of PSF at L4-5, 2004, ,and removal of hardware in 2010. The patient complains about moderate pain , which radiate to her right posterior hip. She describes the pain as stabbing . Applying heat, taking medications , changing positions alleviate the symptoms. Prolonged sitting and standing aggrevates the symptoms. The patient grades her pain as a 4 /10. The patient reports, that she does aquatic exercises 1-3 times per week. She is taking Gabapentin 300mg  tid now, we d/c the Lyrica after the last visit, mainly because it was more expensive. She reports that the gabapentin is working well for her.   Pain Inventory Average Pain 4 Pain Right Now 4 My pain is intermittent, sharp and stabbing  In the last 24 hours, has pain interfered with the following? General activity 5 Relation with others 5 Enjoyment of life 5 What TIME of day is your pain at its worst? daytime Sleep (in general) Fair  Pain is worse with: sitting and standing Pain improves with: therapy/exercise and medication Relief from Meds: 5  Mobility Do you have any goals in this area?  no  Function Do you have any goals in this area?  no  Neuro/Psych No problems in this area  Prior Studies Any changes since last visit?  no  Physicians involved in your care Any changes since last visit?  no   Family History  Problem Relation Age of Onset  . Diabetes Mother   . Heart disease Mother   . Heart disease Father   . Heart disease Brother    History   Social History  . Marital Status: Widowed    Spouse Name: N/A    Number of Children: N/A  . Years of Education: N/A   Social History Main Topics  . Smoking status: Former Smoker -- 0.50 packs/day for 5 years    Types: Cigarettes    Quit date:  03/15/1968  . Smokeless tobacco: None  . Alcohol Use: Yes     Comment: occasional glass of wine  . Drug Use: No  . Sexual Activity: None   Other Topics Concern  . None   Social History Narrative  . None   Past Surgical History  Procedure Laterality Date  . Retinal detachment surgery  9/13   Past Medical History  Diagnosis Date  . Cervical facet syndrome   . Enthesopathy of hip region   . Synovitis and tenosynovitis   . Disorders of sacrum   . Sciatic neuritis   . Depression    BP 137/66  Pulse 80  Resp 16  Ht 5\' 3"  (1.6 m)  Wt 139 lb (63.05 kg)  BMI 24.63 kg/m2  SpO2 95%     Review of Systems  Musculoskeletal: Positive for back pain.  All other systems reviewed and are negative.       Objective:   Physical Exam Constitutional: She is oriented to person, place, and time. She appears well-developed and well-nourished.  HENT:  Head: Normocephalic.  Neck: Neck supple.  Musculoskeletal: She exhibits tenderness.  Neurological: She is alert and oriented to person, place, and time.  Skin: Skin is warm and dry.  Psychiatric: She has a normal mood and affect.  Symmetric normal motor tone is noted throughout, except increased  muscle tone in trapezius bilateral. Normal muscle bulk. Muscle testing reveals 5/5 muscle strength of the upper extremity, and 5/5 of the lower extremity. Full range of motion in upper and lower extremities. ROM of spine is restricted. Fine motor movements are normal in both hands.  DTR in the upper and lower extremity are present and symmetric 2+. No clonus is noted.  Patient arises from chair with mild difficulty. Narrow based gait with normal arm swing bilateral , able to walk on heels and toes . Tandem walk is stable. No pronator drift. Rhomberg negative        Assessment & Plan:  1. Right buttock pain and post laminectomy syndrome.  2. Left leg discrepancy.  3. New onset left facial numbness -resolved  1. I refilled Percocet 10/325 one  p.o. q.6 h. p.r.n., 100 ; and fentanyl to again for now. Consider taper again in the fall or next year.  Discussed to try to wean down on the Ambien, consider referral to sleep specialist. Decreased the ambien from 10mg  to 5mg  today and gave her a prescription.  2. Patient is taking Gabapentin, 300mg  tid, this works well for her, she did not notice any adverse effects. We d/c Lyrica, because of financial reasons.  4. Discussed appropriate ROM, stretching, massage, desensitization exercises for her low back, especially water exercises would be very beneficial for her, she is a member in a [employer] now, and has started water aerobics, which is beneficial.I encouraged her to continue.  Follow up in 1 month.

## 2012-10-22 ENCOUNTER — Encounter: Payer: Self-pay | Admitting: Physical Medicine and Rehabilitation

## 2012-10-26 MED ORDER — GABAPENTIN 300 MG PO CAPS: 300.0000 mg | ORAL_CAPSULE | Freq: Three times a day (TID) | ORAL | Status: DC

## 2012-11-03 ENCOUNTER — Encounter: Payer: Self-pay | Admitting: Physical Medicine and Rehabilitation

## 2012-11-03 ENCOUNTER — Encounter
Payer: Medicare Other | Attending: Physical Medicine and Rehabilitation | Admitting: Physical Medicine and Rehabilitation

## 2012-11-03 VITALS — BP 188/107 | HR 70 | Resp 14 | Ht 63.0 in | Wt 140.0 lb

## 2012-11-03 DIAGNOSIS — R030 Elevated blood-pressure reading, without diagnosis of hypertension: Secondary | ICD-10-CM | POA: Insufficient documentation

## 2012-11-03 DIAGNOSIS — Z79899 Other long term (current) drug therapy: Secondary | ICD-10-CM | POA: Insufficient documentation

## 2012-11-03 DIAGNOSIS — Z5181 Encounter for therapeutic drug level monitoring: Secondary | ICD-10-CM

## 2012-11-03 DIAGNOSIS — M2170 Unequal limb length (acquired), unspecified site: Secondary | ICD-10-CM | POA: Insufficient documentation

## 2012-11-03 DIAGNOSIS — IMO0001 Reserved for inherently not codable concepts without codable children: Secondary | ICD-10-CM | POA: Insufficient documentation

## 2012-11-03 DIAGNOSIS — M533 Sacrococcygeal disorders, not elsewhere classified: Secondary | ICD-10-CM

## 2012-11-03 DIAGNOSIS — M961 Postlaminectomy syndrome, not elsewhere classified: Secondary | ICD-10-CM | POA: Insufficient documentation

## 2012-11-03 MED ORDER — FENTANYL 50 MCG/HR TD PT72: 1.0000 | MEDICATED_PATCH | TRANSDERMAL | Status: DC

## 2012-11-03 MED ORDER — OXYCODONE-ACETAMINOPHEN 10-325 MG PO TABS: 1.0000 | ORAL_TABLET | Freq: Four times a day (QID) | ORAL | Status: DC | PRN

## 2012-11-03 MED ORDER — DULOXETINE HCL 30 MG PO CPEP: ORAL_CAPSULE | ORAL | Status: DC

## 2012-11-03 MED ORDER — TRAZODONE HCL 100 MG PO TABS: ORAL_TABLET | ORAL | Status: DC

## 2012-11-03 NOTE — Progress Notes (Signed)
Subjective:    Patient ID: Alexandria Mclaughlin, female    DOB: [birth year], 72 y.o.   MRN: [id number]  HPI The patient is a 72 year old female female, who presents with chronic low back pain . The symptoms started 14 years ago. Hx of PSF at L4-5, 2004, ,and removal of hardware in 2010. The patient complains about moderate pain , which radiate to her right posterior hip. She describes the pain as stabbing . Applying heat, taking medications , changing positions alleviate the symptoms. Prolonged sitting and standing aggrevates the symptoms. The patient grades her pain as a 4 /10. The patient reports, that she does aquatic exercises 1-3 times per week. She is taking Gabapentin 300mg  tid now, we d/c the Lyrica after the last visit, mainly because it was more expensive. She reports that the gabapentin is working well for her.  She reports that she had a leak in her house and is stressed out with the consequences, like repair, insurance etc. Pain Inventory Average Pain 5 Pain Right Now 5 My pain is intermittent, sharp and stabbing  In the last 24 hours, has pain interfered with the following? General activity 5 Relation with others 5 Enjoyment of life 6 What TIME of day is your pain at its worst? daytime Sleep (in general) Fair  Pain is worse with: sitting and standing Pain improves with: therapy/exercise and medication Relief from Meds: 5  Mobility walk without assistance Do you have any goals in this area?  no  Function Do you have any goals in this area?  no  Neuro/Psych No problems in this area  Prior Studies Any changes since last visit?  no  Physicians involved in your care Any changes since last visit?  no   Family History  Problem Relation Age of Onset  . Diabetes Mother   . Heart disease Mother   . Heart disease Father   . Heart disease Brother    History   Social History  . Marital Status: Widowed    Spouse Name: N/A    Number of Children: N/A  . Years of Education: N/A    Social History Main Topics  . Smoking status: Former Smoker -- 0.50 packs/day for 5 years    Types: Cigarettes    Quit date: 03/15/1968  . Smokeless tobacco: None  . Alcohol Use: Yes     Comment: occasional glass of wine  . Drug Use: No  . Sexual Activity: None   Other Topics Concern  . None   Social History Narrative  . None   Past Surgical History  Procedure Laterality Date  . Retinal detachment surgery  9/13   Past Medical History  Diagnosis Date  . Cervical facet syndrome   . Enthesopathy of hip region   . Synovitis and tenosynovitis   . Disorders of sacrum   . Sciatic neuritis   . Depression    BP 188/107  Pulse 70  Resp 14  Ht 5\' 3"  (1.6 m)  Wt 140 lb (63.504 kg)  BMI 24.81 kg/m2  SpO2 96%     Review of Systems  All other systems reviewed and are negative.       Objective:   Physical Exam Constitutional: She is oriented to person, place, and time. She appears well-developed and well-nourished.  HENT:  Head: Normocephalic.  Neck: Neck supple.  Musculoskeletal: She exhibits tenderness.  Neurological: She is alert and oriented to person, place, and time.  Skin: Skin is warm and dry.  Psychiatric:  She has a normal mood and affect.  Symmetric normal motor tone is noted throughout, except increased muscle tone in trapezius bilateral. Normal muscle bulk. Muscle testing reveals 5/5 muscle strength of the upper extremity, and 5/5 of the lower extremity. Full range of motion in upper and lower extremities. ROM of spine is restricted. Fine motor movements are normal in both hands.  DTR in the upper and lower extremity are present and symmetric 2+. No clonus is noted.  Patient arises from chair with mild difficulty. Narrow based gait with normal arm swing bilateral , able to walk on heels and toes . Tandem walk is stable. No pronator drift. Rhomberg negative        Assessment & Plan:  1. Right buttock pain and post laminectomy syndrome.  2. Left leg  discrepancy.  3. New onset left facial numbness -resolved  1. I refilled Percocet 10/325 one p.o. q.6 h. p.r.n., 100 ; and fentanyl to again for now. Consider taper again in the fall or next year.  Discussed to try to wean down on the Ambien, consider referral to sleep specialist. Decreased the ambien from 10mg  to 5mg  today and gave her a prescription.  2. Patient is taking Gabapentin, 300mg  tid, this works well for her, she did not notice any adverse effects. We d/c Lyrica, because of financial reasons.  4. Discussed appropriate ROM, stretching, massage, desensitization exercises for her low back, especially water exercises would be very beneficial for her, she is a member in a [employer] now, and has started water aerobics, which is beneficial.I encouraged her to continue.  Her Bp was high today, she states that she had a leak in her house and is dealing with all the repairs etc, therefore she is stressed out. She has a BP device at home, I advised her to measure her BP for the next 2 days and if her BP stays high she should follow up with her PCP, she should go to urgent care or her PCP immediately if she develops Sx, like headaches, vision problems etc. The patient understood and agreed. Follow up in 1 month.

## 2012-11-03 NOTE — Patient Instructions (Signed)
Measure your BP at home, if you still have high numbers follow up with your PCP

## 2012-11-30 ENCOUNTER — Encounter: Payer: Self-pay | Admitting: Physical Medicine and Rehabilitation

## 2012-11-30 ENCOUNTER — Encounter
Payer: Medicare Other | Attending: Physical Medicine and Rehabilitation | Admitting: Physical Medicine and Rehabilitation

## 2012-11-30 VITALS — BP 150/82 | HR 70 | Resp 14 | Ht 63.0 in | Wt 139.0 lb

## 2012-11-30 DIAGNOSIS — G8929 Other chronic pain: Secondary | ICD-10-CM | POA: Insufficient documentation

## 2012-11-30 DIAGNOSIS — M961 Postlaminectomy syndrome, not elsewhere classified: Secondary | ICD-10-CM

## 2012-11-30 DIAGNOSIS — R209 Unspecified disturbances of skin sensation: Secondary | ICD-10-CM | POA: Insufficient documentation

## 2012-11-30 DIAGNOSIS — M2170 Unequal limb length (acquired), unspecified site: Secondary | ICD-10-CM

## 2012-11-30 DIAGNOSIS — M545 Low back pain, unspecified: Secondary | ICD-10-CM | POA: Insufficient documentation

## 2012-11-30 DIAGNOSIS — IMO0001 Reserved for inherently not codable concepts without codable children: Secondary | ICD-10-CM | POA: Insufficient documentation

## 2012-11-30 DIAGNOSIS — M533 Sacrococcygeal disorders, not elsewhere classified: Secondary | ICD-10-CM

## 2012-11-30 MED ORDER — FENTANYL 50 MCG/HR TD PT72: 1.0000 | MEDICATED_PATCH | TRANSDERMAL | Status: DC

## 2012-11-30 MED ORDER — ZOLPIDEM TARTRATE 5 MG PO TABS: 5.0000 mg | ORAL_TABLET | Freq: Every evening | ORAL | Status: DC | PRN

## 2012-11-30 MED ORDER — OXYCODONE-ACETAMINOPHEN 10-325 MG PO TABS: 1.0000 | ORAL_TABLET | Freq: Four times a day (QID) | ORAL | Status: DC | PRN

## 2012-11-30 NOTE — Patient Instructions (Signed)
Continue with your exercise program 

## 2012-11-30 NOTE — Progress Notes (Signed)
Subjective:    Patient ID: Alexandria Mclaughlin, female    DOB: 16-Nov-1940, 72 y.o.   MRN: [id number]  HPI The patient is a 72 year old female female, who presents with chronic low back pain . The symptoms started 14 years ago. Hx of PSF at L4-5, 2004, ,and removal of hardware in 2010. The patient complains about moderate pain , which radiate to her right posterior hip. She describes the pain as stabbing . Applying heat, taking medications , changing positions alleviate the symptoms. Prolonged sitting and standing aggrevates the symptoms. The patient grades her pain as a 4 /10. The patient reports, that she does aquatic exercises 1-3 times per week. She is taking Gabapentin 300mg  tid now, we d/c the Lyrica after the last visit, mainly because it was more expensive. She reports that the gabapentin is working well for her.    Pain Inventory Average Pain 5 Pain Right Now 4 My pain is intermittent, sharp and stabbing  In the last 24 hours, has pain interfered with the following? General activity 4 Relation with others 4 Enjoyment of life 4 What TIME of day is your pain at its worst? daytime Sleep (in general) Fair  Pain is worse with: sitting, inactivity and standing Pain improves with: therapy/exercise and medication Relief from Meds: 5  Mobility walk without assistance Do you have any goals in this area?  no  Function retired Do you have any goals in this area?  no  Neuro/Psych No problems in this area  Prior Studies Any changes since last visit?  no  Physicians involved in your care Any changes since last visit?  no   Family History  Problem Relation Age of Onset  . Diabetes Mother   . Heart disease Mother   . Heart disease Father   . Heart disease Brother    History   Social History  . Marital Status: Widowed    Spouse Name: N/A    Number of Children: N/A  . Years of Education: N/A   Social History Main Topics  . Smoking status: Former Smoker -- 0.50 packs/day for 5  years    Types: Cigarettes    Quit date: 03/15/1968  . Smokeless tobacco: None  . Alcohol Use: Yes     Comment: occasional glass of wine  . Drug Use: No  . Sexual Activity: None   Other Topics Concern  . None   Social History Narrative  . None   Past Surgical History  Procedure Laterality Date  . Retinal detachment surgery  9/13   Past Medical History  Diagnosis Date  . Cervical facet syndrome   . Enthesopathy of hip region   . Synovitis and tenosynovitis   . Disorders of sacrum   . Sciatic neuritis   . Depression    BP 150/82  Pulse 70  Resp 14  Ht 5\' 3"  (1.6 m)  Wt 139 lb (63.05 kg)  BMI 24.63 kg/m2  SpO2 99%     Review of Systems  Musculoskeletal: Positive for arthralgias.  All other systems reviewed and are negative.       Objective:   Physical Exam Constitutional: She is oriented to person, place, and time. She appears well-developed and well-nourished.  HENT:  Head: Normocephalic.  Neck: Neck supple.  Musculoskeletal: She exhibits tenderness.  Neurological: She is alert and oriented to person, place, and time.  Skin: Skin is warm and dry.  Psychiatric: She has a normal mood and affect.  Symmetric normal motor tone  is noted throughout, except increased muscle tone in trapezius bilateral. Normal muscle bulk. Muscle testing reveals 5/5 muscle strength of the upper extremity, and 5/5 of the lower extremity. Full range of motion in upper and lower extremities. ROM of spine is restricted. Fine motor movements are normal in both hands.  DTR in the upper and lower extremity are present and symmetric 2+. No clonus is noted.  Patient arises from chair with mild difficulty. Narrow based gait with normal arm swing bilateral , able to walk on heels and toes . Tandem walk is stable. No pronator drift. Rhomberg negative        Assessment & Plan:  1. Right buttock pain and post laminectomy syndrome.  2. Left leg discrepancy.  3. New onset left facial numbness  -resolved  1. I refilled Percocet 10/325 one p.o. q.6 h. p.r.n., 100 ; and fentanyl to again for now. Consider taper again in the fall or next year.  Discussed to try to wean down on the Ambien, consider referral to sleep specialist. Decreased the ambien from 10mg  to 5mg  today and gave her a prescription.  2. Patient is taking Gabapentin, 300mg  tid, this works well for her, she did not notice any adverse effects. We d/c Lyrica, because of financial reasons.  4. Discussed appropriate ROM, stretching, massage, desensitization exercises for her low back, especially water exercises would be very beneficial for her, she is a member in a [employer] now, and has started water aerobics, which is beneficial.I encouraged her to continue.  Her Bp has stabilized after the stress about repairing a leak in her home has resolved. Follow up in 1 month.

## 2012-12-30 ENCOUNTER — Other Ambulatory Visit: Payer: Self-pay

## 2012-12-31 ENCOUNTER — Encounter: Payer: Self-pay | Admitting: Physical Medicine and Rehabilitation

## 2012-12-31 ENCOUNTER — Encounter
Payer: Medicare Other | Attending: Physical Medicine and Rehabilitation | Admitting: Physical Medicine and Rehabilitation

## 2012-12-31 VITALS — BP 167/97 | HR 69 | Resp 14 | Ht 63.0 in | Wt 141.0 lb

## 2012-12-31 DIAGNOSIS — M79604 Pain in right leg: Secondary | ICD-10-CM

## 2012-12-31 DIAGNOSIS — G8929 Other chronic pain: Secondary | ICD-10-CM | POA: Insufficient documentation

## 2012-12-31 DIAGNOSIS — M545 Low back pain, unspecified: Secondary | ICD-10-CM

## 2012-12-31 DIAGNOSIS — R209 Unspecified disturbances of skin sensation: Secondary | ICD-10-CM | POA: Insufficient documentation

## 2012-12-31 DIAGNOSIS — M533 Sacrococcygeal disorders, not elsewhere classified: Secondary | ICD-10-CM

## 2012-12-31 DIAGNOSIS — IMO0001 Reserved for inherently not codable concepts without codable children: Secondary | ICD-10-CM | POA: Insufficient documentation

## 2012-12-31 DIAGNOSIS — M2170 Unequal limb length (acquired), unspecified site: Secondary | ICD-10-CM

## 2012-12-31 DIAGNOSIS — M961 Postlaminectomy syndrome, not elsewhere classified: Secondary | ICD-10-CM

## 2012-12-31 MED ORDER — OXYCODONE-ACETAMINOPHEN 10-325 MG PO TABS: 1.0000 | ORAL_TABLET | Freq: Four times a day (QID) | ORAL | Status: DC | PRN

## 2012-12-31 MED ORDER — GABAPENTIN 300 MG PO CAPS: 300.0000 mg | ORAL_CAPSULE | Freq: Three times a day (TID) | ORAL | Status: DC

## 2012-12-31 MED ORDER — FENTANYL 50 MCG/HR TD PT72: 50.0000 ug | MEDICATED_PATCH | TRANSDERMAL | Status: DC

## 2012-12-31 NOTE — Patient Instructions (Signed)
Continue with your aquatic exercising

## 2012-12-31 NOTE — Progress Notes (Signed)
Subjective:    Patient ID: Alexandria Mclaughlin, female    DOB: 1941/02/24, 72 y.o.   MRN: [id number]  HPI The patient is a 72 year old female female, who presents with chronic low back pain . The symptoms started 14 years ago. Hx of PSF at L4-5, 2004, ,and removal of hardware in 2010. The patient complains about moderate pain , which radiate to her right posterior hip. She describes the pain as stabbing . Applying heat, taking medications , changing positions alleviate the symptoms. Prolonged sitting and standing aggrevates the symptoms. The patient grades her pain as a 4 /10. The patient reports, that she does aquatic exercises 1-3 times per week. She is taking Gabapentin 300mg  tid now, we d/c the Lyrica after the last visit, mainly because it was more expensive. She reports that the gabapentin is working well for her, but with the colder weather she has a little more Sx, especially at night.   Pain Inventory Average Pain 5 Pain Right Now 5 My pain is intermittent, sharp and stabbing  In the last 24 hours, has pain interfered with the following? General activity 4 Relation with others 4 Enjoyment of life 5 What TIME of day is your pain at its worst? daytime Sleep (in general) Fair  Pain is worse with: sitting, inactivity and standing Pain improves with: heat/ice and medication Relief from Meds: 6  Mobility Do you have any goals in this area?  no  Function Do you have any goals in this area?  no  Neuro/Psych No problems in this area  Prior Studies Any changes since last visit?  no  Physicians involved in your care Any changes since last visit?  no   Family History  Problem Relation Age of Onset  . Diabetes Mother   . Heart disease Mother   . Heart disease Father   . Heart disease Brother    History   Social History  . Marital Status: Widowed    Spouse Name: N/A    Number of Children: N/A  . Years of Education: N/A   Social History Main Topics  . Smoking status: Former  Smoker -- 0.50 packs/day for 5 years    Types: Cigarettes    Quit date: 03/15/1968  . Smokeless tobacco: None  . Alcohol Use: Yes     Comment: occasional glass of wine  . Drug Use: No  . Sexual Activity: None   Other Topics Concern  . None   Social History Narrative  . None   Past Surgical History  Procedure Laterality Date  . Retinal detachment surgery  9/13   Past Medical History  Diagnosis Date  . Cervical facet syndrome   . Enthesopathy of hip region   . Synovitis and tenosynovitis   . Disorders of sacrum   . Sciatic neuritis   . Depression    BP 167/97  Pulse 69  Resp 14  Ht 5\' 3"  (1.6 m)  Wt 141 lb (63.957 kg)  BMI 24.98 kg/m2  SpO2 99%     Review of Systems  Musculoskeletal: Positive for arthralgias and myalgias.  All other systems reviewed and are negative.       Objective:   Physical Exam Constitutional: She is oriented to person, place, and time. She appears well-developed and well-nourished.  HENT:  Head: Normocephalic.  Neck: Neck supple.  Musculoskeletal: She exhibits tenderness.  Neurological: She is alert and oriented to person, place, and time.  Skin: Skin is warm and dry.  Psychiatric: She  has a normal mood and affect.  Symmetric normal motor tone is noted throughout, except increased muscle tone in trapezius bilateral. Normal muscle bulk. Muscle testing reveals 5/5 muscle strength of the upper extremity, and 5/5 of the lower extremity. Full range of motion in upper and lower extremities. ROM of spine is restricted. Fine motor movements are normal in both hands.  DTR in the upper and lower extremity are present and symmetric 2+. No clonus is noted.  Patient arises from chair with mild difficulty. Narrow based gait with normal arm swing bilateral , able to walk on heels and toes . Tandem walk is stable. No pronator drift. Rhomberg negative        Assessment & Plan:  1. Right buttock pain and post laminectomy syndrome.  2. Left leg  discrepancy.  3. New onset left facial numbness -resolved  1. I refilled Percocet 10/325 one p.o. q.6 h. p.r.n., 100 ; and fentanyl to again for now. Consider taper again in the fall or next year.  Discussed to try to wean down on the Ambien, consider referral to sleep specialist. Decreased the ambien from 10mg  to 5mg  today and gave her a prescription.  2. Patient is taking Gabapentin, 300mg  tid, this works well for her,although she complains about some increasing Sx in this colder weather, we will try to increase the gabapentin by taking one extra capsule at night time. She did not notice any adverse effects with the Gabapentin. We d/c Lyrica, because of financial reasons.  4. Discussed appropriate ROM, stretching, massage, desensitization exercises for her low back, especially water exercises would be very beneficial for her, she is a member in a [employer] now, and has started water aerobics, which is beneficial.I encouraged her to continue.  Her Bp has stabilized after the stress about repairing a leak in her home has resolved.  Follow up in 1 month.

## 2013-01-24 ENCOUNTER — Encounter: Payer: Self-pay | Admitting: Physical Medicine and Rehabilitation

## 2013-01-26 ENCOUNTER — Other Ambulatory Visit: Payer: Self-pay | Admitting: *Deleted

## 2013-01-26 DIAGNOSIS — M2170 Unequal limb length (acquired), unspecified site: Secondary | ICD-10-CM

## 2013-01-26 DIAGNOSIS — M533 Sacrococcygeal disorders, not elsewhere classified: Secondary | ICD-10-CM

## 2013-01-26 MED ORDER — FENTANYL 50 MCG/HR TD PT72: 50.0000 ug | MEDICATED_PATCH | TRANSDERMAL | Status: DC

## 2013-01-26 MED ORDER — OXYCODONE-ACETAMINOPHEN 10-325 MG PO TABS: 1.0000 | ORAL_TABLET | Freq: Four times a day (QID) | ORAL | Status: DC | PRN

## 2013-01-26 MED ORDER — ZOLPIDEM TARTRATE 5 MG PO TABS: 5.0000 mg | ORAL_TABLET | Freq: Every evening | ORAL | Status: DC | PRN

## 2013-01-26 NOTE — Telephone Encounter (Signed)
rx printed early for controlled medication for the visit with RN on 02/01/13 (to be signed by MD) 

## 2013-01-28 ENCOUNTER — Other Ambulatory Visit: Payer: Self-pay | Admitting: Physical Medicine and Rehabilitation

## 2013-01-28 ENCOUNTER — Encounter: Payer: Self-pay | Admitting: Physical Medicine and Rehabilitation

## 2013-01-31 ENCOUNTER — Ambulatory Visit: Payer: Medicare Other | Admitting: Physical Medicine and Rehabilitation

## 2013-02-01 ENCOUNTER — Encounter: Payer: Self-pay | Admitting: *Deleted

## 2013-02-01 ENCOUNTER — Encounter: Payer: Medicare Other | Attending: Physical Medicine & Rehabilitation | Admitting: *Deleted

## 2013-02-01 VITALS — BP 171/87 | HR 74 | Resp 14 | Ht 63.0 in | Wt 143.0 lb

## 2013-02-01 DIAGNOSIS — IMO0001 Reserved for inherently not codable concepts without codable children: Secondary | ICD-10-CM | POA: Insufficient documentation

## 2013-02-01 DIAGNOSIS — M533 Sacrococcygeal disorders, not elsewhere classified: Secondary | ICD-10-CM

## 2013-02-01 DIAGNOSIS — M961 Postlaminectomy syndrome, not elsewhere classified: Secondary | ICD-10-CM

## 2013-02-01 DIAGNOSIS — M545 Low back pain, unspecified: Secondary | ICD-10-CM | POA: Insufficient documentation

## 2013-02-01 DIAGNOSIS — R209 Unspecified disturbances of skin sensation: Secondary | ICD-10-CM | POA: Insufficient documentation

## 2013-02-01 DIAGNOSIS — M2170 Unequal limb length (acquired), unspecified site: Secondary | ICD-10-CM

## 2013-02-01 DIAGNOSIS — G8929 Other chronic pain: Secondary | ICD-10-CM | POA: Insufficient documentation

## 2013-02-01 NOTE — Progress Notes (Signed)
Here for pill count and medication refills.oxycodone 10-325 # 100  Fill date 12/31/12     Today NV#0 and Fentanyl 50 mcg fill date 12/31/12 today NV# 0 appropriate.  Refills given for these medications.  Refill given on her ambien 10 mg as well.  No changes in pain levels or medications.  Follow up with PA in one month

## 2013-02-01 NOTE — Patient Instructions (Signed)
Follow up one month with PA for med refills

## 2013-02-18 ENCOUNTER — Other Ambulatory Visit: Payer: Self-pay

## 2013-02-18 MED ORDER — GABAPENTIN 300 MG PO CAPS: 300.0000 mg | ORAL_CAPSULE | ORAL | Status: DC

## 2013-03-03 ENCOUNTER — Encounter: Payer: Self-pay | Admitting: Physical Medicine and Rehabilitation

## 2013-03-03 ENCOUNTER — Encounter
Payer: Medicare Other | Attending: Physical Medicine and Rehabilitation | Admitting: Physical Medicine and Rehabilitation

## 2013-03-03 VITALS — BP 170/91 | HR 89 | Resp 14 | Ht 63.0 in | Wt 142.0 lb

## 2013-03-03 DIAGNOSIS — R269 Unspecified abnormalities of gait and mobility: Secondary | ICD-10-CM | POA: Insufficient documentation

## 2013-03-03 DIAGNOSIS — G4700 Insomnia, unspecified: Secondary | ICD-10-CM | POA: Insufficient documentation

## 2013-03-03 DIAGNOSIS — I10 Essential (primary) hypertension: Secondary | ICD-10-CM | POA: Insufficient documentation

## 2013-03-03 DIAGNOSIS — G8929 Other chronic pain: Secondary | ICD-10-CM

## 2013-03-03 DIAGNOSIS — Z79899 Other long term (current) drug therapy: Secondary | ICD-10-CM | POA: Insufficient documentation

## 2013-03-03 DIAGNOSIS — M2170 Unequal limb length (acquired), unspecified site: Secondary | ICD-10-CM | POA: Insufficient documentation

## 2013-03-03 DIAGNOSIS — G4701 Insomnia due to medical condition: Secondary | ICD-10-CM

## 2013-03-03 DIAGNOSIS — M533 Sacrococcygeal disorders, not elsewhere classified: Secondary | ICD-10-CM

## 2013-03-03 DIAGNOSIS — IMO0001 Reserved for inherently not codable concepts without codable children: Secondary | ICD-10-CM | POA: Insufficient documentation

## 2013-03-03 DIAGNOSIS — M961 Postlaminectomy syndrome, not elsewhere classified: Secondary | ICD-10-CM | POA: Insufficient documentation

## 2013-03-03 MED ORDER — FENTANYL 50 MCG/HR TD PT72: 50.0000 ug | MEDICATED_PATCH | TRANSDERMAL | Status: DC

## 2013-03-03 MED ORDER — OXYCODONE-ACETAMINOPHEN 10-325 MG PO TABS: 1.0000 | ORAL_TABLET | Freq: Four times a day (QID) | ORAL | Status: DC | PRN

## 2013-03-03 NOTE — Progress Notes (Signed)
Subjective:    Patient ID: Alexandria Mclaughlin, female    DOB: Oct 04, 1940, 73 y.o.   MRN: [id number]  HPI [name] returns for follow up on chronic low back pain and medication refill. Her symptoms started 14 years ago. Hx of PSF at L4-5, 2004, ,and removal of hardware in 2010. The patient complains about moderate pain , which radiate to her right posterior buttock especially with activity. Pain control is stable at 5/10. She is taking Gabapentin 300 mg qid. She continues taking Cymbalta daily for pain management as well as mood stabilization. Insomnia is managed with use of trazodone daily in addition to Ambien on prn basis. She continues with aquatic exercises twice a week--Silver Sneakers no longer available from [employer].   Pain Inventory Average Pain 5 Pain Right Now 5 My pain is intermittent, sharp and stabbing  In the last 24 hours, has pain interfered with the following? General activity 5 Relation with others 5 Enjoyment of life 5 What TIME of day is your pain at its worst? daytime Sleep (in general) Fair  Pain is worse with: sitting and standing Pain improves with: medication and TENS Relief from Meds: 5  Mobility Do you have any goals in this area?  no  Function Do you have any goals in this area?  no  Neuro/Psych No problems in this area  Prior Studies Any changes since last visit?  no  Physicians involved in your care Any changes since last visit?  no   Family History  Problem Relation Age of Onset  . Diabetes Mother   . Heart disease Mother   . Heart disease Father   . Heart disease Brother    History   Social History  . Marital Status: Widowed    Spouse Name: N/A    Number of Children: N/A  . Years of Education: N/A   Social History Main Topics  . Smoking status: Former Smoker -- 0.50 packs/day for 5 years    Types: Cigarettes    Quit date: 03/15/1968  . Smokeless tobacco: None  . Alcohol Use: Yes     Comment: occasional glass of wine  . Drug  Use: No  . Sexual Activity: None   Other Topics Concern  . None   Social History Narrative  . None   Past Surgical History  Procedure Laterality Date  . Retinal detachment surgery  9/13   Past Medical History  Diagnosis Date  . Cervical facet syndrome   . Enthesopathy of hip region   . Synovitis and tenosynovitis   . Disorders of sacrum   . Sciatic neuritis   . Depression    BP 170/91  Pulse 89  Resp 14  Ht 5\' 3"  (1.6 m)  Wt 142 lb (64.411 kg)  BMI 25.16 kg/m2  SpO2 96%     Review of Systems  Musculoskeletal: Positive for back pain and gait problem.  All other systems reviewed and are negative.       Objective:   Physical Exam  Nursing note and vitals reviewed. Constitutional: She is oriented to person, place, and time. She appears well-developed and well-nourished.  HENT:  Head: Normocephalic and atraumatic.  Eyes: Conjunctivae are normal. Pupils are equal, round, and reactive to light.  Neck: Normal range of motion. Neck supple.  Cardiovascular: Normal rate and regular rhythm.   Pulmonary/Chest: Effort normal and breath sounds normal. No respiratory distress. She has no wheezes.  Abdominal: Soft. Bowel sounds are normal. She exhibits no distension.  There is no tenderness.  Musculoskeletal: She exhibits no edema and no tenderness.  Normal muscle bulk. Muscle testing reveals 5/5 muscle strength of the upper extremity, and 5/5 of the lower extremity. Full range of motion in upper and lower extremities. No pain on palpation of trapezius or lower back.   Neurological: She is alert and oriented to person, place, and time.  Skin: Skin is warm and dry.          Assessment & Plan:  1. Right buttock pain and post laminectomy syndrome.--I refilled percocet 10/325 # 100--take one p.o. Every 6 hours as needed. No refills.  Refilled Fentanyl patch 50 mcg # 10--change every 72 hours. No refills. Continue gabapentin qid--has refills. Follow up in one month.  2. Left  leg discrepancy 3. Insomnia: Reports good results with trazodone 100 mg/hs--has refills. Did not need refill on ambien. 4. HTN: blood pressure rechecked--138/84. 5. Preventive Care: Advised discussing Pneumonia Vaccine with Dr. Ashby Dawes as she is over 54 years old.  She had had flu vaccine this year. Continue ROM exercises and check into local adult enrichment center about exercise programs for older adults.

## 2013-03-03 NOTE — Patient Instructions (Signed)
Check into local Adult Enrichment centers for older adult exercise programs.

## 2013-04-07 ENCOUNTER — Encounter: Payer: Self-pay | Admitting: Physical Medicine and Rehabilitation

## 2013-04-07 ENCOUNTER — Encounter
Payer: Medicare Other | Attending: Physical Medicine and Rehabilitation | Admitting: Physical Medicine and Rehabilitation

## 2013-04-07 VITALS — BP 139/69 | HR 70 | Resp 14 | Ht 63.0 in | Wt 145.0 lb

## 2013-04-07 DIAGNOSIS — M2170 Unequal limb length (acquired), unspecified site: Secondary | ICD-10-CM

## 2013-04-07 DIAGNOSIS — I10 Essential (primary) hypertension: Secondary | ICD-10-CM | POA: Insufficient documentation

## 2013-04-07 DIAGNOSIS — M533 Sacrococcygeal disorders, not elsewhere classified: Secondary | ICD-10-CM

## 2013-04-07 DIAGNOSIS — Z87891 Personal history of nicotine dependence: Secondary | ICD-10-CM | POA: Insufficient documentation

## 2013-04-07 DIAGNOSIS — G8929 Other chronic pain: Secondary | ICD-10-CM | POA: Insufficient documentation

## 2013-04-07 DIAGNOSIS — M545 Low back pain, unspecified: Secondary | ICD-10-CM | POA: Insufficient documentation

## 2013-04-07 DIAGNOSIS — G4700 Insomnia, unspecified: Secondary | ICD-10-CM | POA: Insufficient documentation

## 2013-04-07 DIAGNOSIS — M961 Postlaminectomy syndrome, not elsewhere classified: Secondary | ICD-10-CM | POA: Insufficient documentation

## 2013-04-07 MED ORDER — OXYCODONE-ACETAMINOPHEN 10-325 MG PO TABS: 1.0000 | ORAL_TABLET | Freq: Four times a day (QID) | ORAL | Status: DC | PRN

## 2013-04-07 MED ORDER — FENTANYL 50 MCG/HR TD PT72: 50.0000 ug | MEDICATED_PATCH | TRANSDERMAL | Status: DC

## 2013-04-07 NOTE — Progress Notes (Signed)
Subjective:    Patient ID: Alexandria Mclaughlin, female    DOB: [birth year], 73 y.o.   MRN: [id number]  HPI  Alexandria Mclaughlin returns for follow up on chronic low back pain and medication refill. Her symptoms started 14 years ago. Hx of PSF at L4-5, 2004, with  removal of hardware in 2010. Her pain is reasonably controlled today and rates it as 4/10. She does not belong to [facility] program anymore to due cost and lack of insurance coverage. Her current exercise regimen includes walking an hour most days of the week as well as LE strengthening exercises.  She uses treadmill when weather is bad. She feels that this is helping with back pain management. She is happy with her QOL.     Pain Inventory Average Pain 4 Pain Right Now 4 My pain is intermittent, sharp and stabbing  In the last 24 hours, has pain interfered with the following? General activity 4 Relation with others 5 Enjoyment of life 5 What TIME of day is your pain at its worst? daytime Sleep (in general) Fair  Pain is worse with: bending, sitting and standing Pain improves with: therapy/exercise, medication and TENS Relief from Meds: 5  Mobility walk without assistance Do you have any goals in this area?  no  Function retired Do you have any goals in this area?  no  Neuro/Psych No problems in this area  Prior Studies Any changes since last visit?  no  Physicians involved in your care Any changes since last visit?  no   Family History  Problem Relation Age of Onset  . Diabetes Mother   . Heart disease Mother   . Heart disease Father   . Heart disease Brother    History   Social History  . Marital Status: Widowed    Spouse Name: N/A    Number of Children: N/A  . Years of Education: N/A   Social History Main Topics  . Smoking status: Former Smoker -- 0.50 packs/day for 5 years    Types: Cigarettes    Quit date: 03/15/1968  . Smokeless tobacco: None  . Alcohol Use: Yes     Comment: occasional glass of wine  . Drug  Use: No  . Sexual Activity: None   Other Topics Concern  . None   Social History Narrative  . None   Past Surgical History  Procedure Laterality Date  . Retinal detachment surgery  9/13   Past Medical History  Diagnosis Date  . Cervical facet syndrome   . Enthesopathy of hip region   . Synovitis and tenosynovitis   . Disorders of sacrum   . Sciatic neuritis   . Depression    BP 139/69  Pulse 70  Resp 14  Ht 5\' 3"  (1.6 m)  Wt 145 lb (65.772 kg)  BMI 25.69 kg/m2  SpO2 96%  Opioid Risk Score:   Fall Risk Score: Moderate Fall Risk (6-13 points) (patient educated handout declined)   Review of Systems  Musculoskeletal: Positive for back pain.  All other systems reviewed and are negative.       Objective:   Physical Exam  Nursing note and vitals reviewed. Constitutional: She is oriented to person, place, and time. She appears well-developed and well-nourished.  HENT:  Head: Normocephalic and atraumatic.  Eyes: Conjunctivae are normal. Pupils are equal, round, and reactive to light.  Neck: Normal range of motion. Neck supple.  Cardiovascular: Normal rate and regular rhythm.   Pulmonary/Chest: Effort normal and  breath sounds normal.  Neurological: She is alert and oriented to person, place, and time.  Normal muscle bulk. Muscle testing reveals 5/5 muscle strength of the upper extremity, and 5/5 of the lower extremity. Full range of motion in upper and lower extremities. No pain on palpation of trapezius or lower back. Arises from chair with minimal difficulty and walks with narrow based gait.            Assessment & Plan:  1. Post laminectomy syndrome with right buttock pain: Pain controlled on  Cymbalta, Neurontin, Fentanyl and  Percocet.  Refilled: Fentanyl patch 50 mcg # 10--change every 3 days.  No refills.      Percocet 10/325 mg # 100--use one every 6 hours prn.  No refills.   2. Insomnia: Uses  trazodone daily and augments with ambien if no sleep in an  hour.    3. HTN:  Blood pressure better on check today.

## 2013-04-23 ENCOUNTER — Other Ambulatory Visit: Payer: Self-pay | Admitting: Physical Medicine & Rehabilitation

## 2013-05-02 ENCOUNTER — Other Ambulatory Visit: Payer: Self-pay | Admitting: *Deleted

## 2013-05-02 DIAGNOSIS — M533 Sacrococcygeal disorders, not elsewhere classified: Secondary | ICD-10-CM

## 2013-05-02 DIAGNOSIS — M2170 Unequal limb length (acquired), unspecified site: Secondary | ICD-10-CM

## 2013-05-02 MED ORDER — OXYCODONE-ACETAMINOPHEN 10-325 MG PO TABS: 1.0000 | ORAL_TABLET | Freq: Four times a day (QID) | ORAL | Status: DC | PRN

## 2013-05-02 MED ORDER — ZOLPIDEM TARTRATE 5 MG PO TABS: 5.0000 mg | ORAL_TABLET | Freq: Every evening | ORAL | Status: DC | PRN

## 2013-05-02 MED ORDER — FENTANYL 50 MCG/HR TD PT72: 50.0000 ug | MEDICATED_PATCH | TRANSDERMAL | Status: DC

## 2013-05-02 NOTE — Telephone Encounter (Signed)
RX printed for MD to sign for RN visit 05/03/13 

## 2013-05-03 ENCOUNTER — Encounter: Payer: Medicare Other | Attending: Physical Medicine & Rehabilitation | Admitting: *Deleted

## 2013-05-03 ENCOUNTER — Encounter: Payer: Self-pay | Admitting: *Deleted

## 2013-05-03 VITALS — BP 145/55 | HR 76 | Resp 15

## 2013-05-03 DIAGNOSIS — M545 Low back pain, unspecified: Secondary | ICD-10-CM | POA: Insufficient documentation

## 2013-05-03 DIAGNOSIS — M533 Sacrococcygeal disorders, not elsewhere classified: Secondary | ICD-10-CM

## 2013-05-03 DIAGNOSIS — I10 Essential (primary) hypertension: Secondary | ICD-10-CM | POA: Insufficient documentation

## 2013-05-03 DIAGNOSIS — G8929 Other chronic pain: Secondary | ICD-10-CM | POA: Insufficient documentation

## 2013-05-03 DIAGNOSIS — M961 Postlaminectomy syndrome, not elsewhere classified: Secondary | ICD-10-CM | POA: Insufficient documentation

## 2013-05-03 DIAGNOSIS — Z760 Encounter for issue of repeat prescription: Secondary | ICD-10-CM | POA: Insufficient documentation

## 2013-05-03 NOTE — Patient Instructions (Signed)
Follow up RN med refill next month with [name] in 2 months

## 2013-05-03 NOTE — Progress Notes (Signed)
Here for pill count and medication refills.Fentanyl  48mcg #10 Fill date 04/07/13 Today NV# 0  Percocet 10/325 # 100  Fill date   04/07/13  Today NV# 5  VSS   Naylea has had no falls and is a moderate fall risk.  She has been educated about fall prevention in the home.  Her pill and patch counts were appropriate and refills were given. She will return in a month for med refills and 2 months with [name].

## 2013-05-31 ENCOUNTER — Encounter: Payer: Self-pay | Admitting: Registered Nurse

## 2013-05-31 ENCOUNTER — Encounter: Payer: Medicare Other | Attending: Physical Medicine and Rehabilitation | Admitting: Registered Nurse

## 2013-05-31 VITALS — BP 160/70 | HR 71 | Resp 14 | Ht 63.0 in | Wt 142.6 lb

## 2013-05-31 DIAGNOSIS — M2170 Unequal limb length (acquired), unspecified site: Secondary | ICD-10-CM | POA: Insufficient documentation

## 2013-05-31 DIAGNOSIS — M545 Low back pain, unspecified: Secondary | ICD-10-CM

## 2013-05-31 DIAGNOSIS — M533 Sacrococcygeal disorders, not elsewhere classified: Secondary | ICD-10-CM | POA: Insufficient documentation

## 2013-05-31 DIAGNOSIS — Z5181 Encounter for therapeutic drug level monitoring: Secondary | ICD-10-CM

## 2013-05-31 DIAGNOSIS — Z79899 Other long term (current) drug therapy: Secondary | ICD-10-CM

## 2013-05-31 DIAGNOSIS — G8929 Other chronic pain: Secondary | ICD-10-CM

## 2013-05-31 MED ORDER — FENTANYL 50 MCG/HR TD PT72: 50.0000 ug | MEDICATED_PATCH | TRANSDERMAL | Status: DC

## 2013-05-31 MED ORDER — OXYCODONE-ACETAMINOPHEN 10-325 MG PO TABS: 1.0000 | ORAL_TABLET | Freq: Four times a day (QID) | ORAL | Status: DC | PRN

## 2013-05-31 NOTE — Progress Notes (Signed)
Subjective:    Patient ID: Alexandria Mclaughlin, female    DOB: 27-Mar-1940, 73 y.o.   MRN: [id number]  HPI [name] is a 73 year old female being seen for follow up for chronic low back pain.She rates her pain 5/10. Her pain is in her right buttock and occurs intermittently  denies numbness and tingling. She notices when she sits for long periods of time, her pain becomes intense. Walking and taking rest periods offers her relief. Her exercise includes walking daily when the weather permits. She uses treadmill if it's cold outside.   Pain Inventory Average Pain 5 Pain Right Now 5 My pain is intermittent, sharp and stabbing  In the last 24 hours, has pain interfered with the following? General activity 6 Relation with others 6 Enjoyment of life 6 What TIME of day is your pain at its worst? daytime Sleep (in general) Good  Pain is worse with: sitting, inactivity and standing Pain improves with: therapy/exercise, medication and TENS Relief from Meds: 5  Mobility walk without assistance  Function retired Do you have any goals in this area?  no  Neuro/Psych No problems in this area  Prior Studies Any changes since last visit?  no  Physicians involved in your care Any changes since last visit?  no   Family History  Problem Relation Age of Onset  . Diabetes Mother   . Heart disease Mother   . Heart disease Father   . Heart disease Brother    History   Social History  . Marital Status: Widowed    Spouse Name: N/A    Number of Children: N/A  . Years of Education: N/A   Social History Main Topics  . Smoking status: Former Smoker -- 0.50 packs/day for 5 years    Types: Cigarettes    Quit date: 03/15/1968  . Smokeless tobacco: None  . Alcohol Use: Yes     Comment: occasional glass of wine  . Drug Use: No  . Sexual Activity: None   Other Topics Concern  . None   Social History Narrative  . None   Past Surgical History  Procedure Laterality Date  . Retinal  detachment surgery  9/13   Past Medical History  Diagnosis Date  . Cervical facet syndrome   . Enthesopathy of hip region   . Synovitis and tenosynovitis   . Disorders of sacrum   . Sciatic neuritis   . Depression    BP 160/70  Pulse 71  Resp 14  Ht 5\' 3"  (1.6 m)  Wt 142 lb 9.6 oz (64.683 kg)  BMI 25.27 kg/m2  SpO2 97%  Opioid Risk Score:   Fall Risk Score: Moderate Fall Risk (6-13 points) (educated and handout on fall prevention in the homw was given at previous visit)  Review of Systems  Musculoskeletal: Positive for back pain.  All other systems reviewed and are negative.       Objective:   Physical Exam  Nursing note and vitals reviewed. Constitutional: She is oriented to person, place, and time. She appears well-developed and well-nourished.  HENT:  Head: Normocephalic.  Eyes: Pupils are equal, round, and reactive to light.  Neck: Normal range of motion.  Cardiovascular: Normal rate, regular rhythm and normal heart sounds.   Pulmonary/Chest: Effort normal and breath sounds normal.  Musculoskeletal: Normal range of motion.  Normal Muscle Bulk. Strengthening 4/5,upper extremities. Arises from chair without any difficulty.  Neurological: She is alert and oriented to person, place, and  time.  Skin: Skin is warm and dry.  Psychiatric: She has a normal mood and affect.          Assessment & Plan:  1. Post laminectomy syndrome with right buttock pain: Pain controlled on Fentanyl and Percocet.  Refilled: Fentanyl patch 50 mcg # 10--change every 3 days. No refills.  Percocet 10/325 mg # 100--use one every 6 hours prn. No refills.    HTN: Uncontrolled: Patient states she is compliant with medication. She will F/U with PMD.   30 minutes of face to face patient care time was spent during this visit. All questions were encouraged and answered.

## 2013-06-29 ENCOUNTER — Encounter: Payer: Self-pay | Admitting: Physical Medicine & Rehabilitation

## 2013-06-29 ENCOUNTER — Encounter: Payer: Medicare Other | Attending: Physical Medicine and Rehabilitation | Admitting: Physical Medicine & Rehabilitation

## 2013-06-29 VITALS — BP 151/79 | HR 73 | Resp 14 | Ht 63.0 in | Wt 144.0 lb

## 2013-06-29 DIAGNOSIS — M2170 Unequal limb length (acquired), unspecified site: Secondary | ICD-10-CM | POA: Insufficient documentation

## 2013-06-29 DIAGNOSIS — M961 Postlaminectomy syndrome, not elsewhere classified: Secondary | ICD-10-CM

## 2013-06-29 DIAGNOSIS — M533 Sacrococcygeal disorders, not elsewhere classified: Secondary | ICD-10-CM

## 2013-06-29 MED ORDER — OXYCODONE-ACETAMINOPHEN 10-325 MG PO TABS: 1.0000 | ORAL_TABLET | Freq: Four times a day (QID) | ORAL | Status: DC | PRN

## 2013-06-29 MED ORDER — FENTANYL 50 MCG/HR TD PT72: 50.0000 ug | MEDICATED_PATCH | TRANSDERMAL | Status: DC

## 2013-06-29 NOTE — Progress Notes (Signed)
Subjective:    Patient ID: Alexandria Mclaughlin, female    DOB: June 09, 1940, 73 y.o.   MRN: [id number]  HPI  Alexandria Mclaughlin is back regarding her chronic pain. Her pain has remained pretty stable. She stretches and exercises each day. She typically walks daily. She can walk about an hour without having stop. She does stretch along the way.   She is using 3 percocets per day on avg. She continues to receive benefit from the fentanyl patch. She is using the 62mcg/hr patch.   No new health problems have been reported.  Pain Inventory Average Pain 5 Pain Right Now 5 My pain is intermittent, sharp and stabbing  In the last 24 hours, has pain interfered with the following? General activity 5 Relation with others 5 Enjoyment of life 5 What TIME of day is your pain at its worst? daytime Sleep (in general) Good  Pain is worse with: sitting and standing Pain improves with: heat/ice, therapy/exercise, medication and TENS Relief from Meds: 5  Mobility Do you have any goals in this area?  no  Function Do you have any goals in this area?  no  Neuro/Psych No problems in this area  Prior Studies Any changes since last visit?  no  Physicians involved in your care Any changes since last visit?  no   Family History  Problem Relation Age of Onset  . Diabetes Mother   . Heart disease Mother   . Heart disease Father   . Heart disease Brother    History   Social History  . Marital Status: Widowed    Spouse Name: N/A    Number of Children: N/A  . Years of Education: N/A   Social History Main Topics  . Smoking status: Former Smoker -- 0.50 packs/day for 5 years    Types: Cigarettes    Quit date: 03/15/1968  . Smokeless tobacco: None  . Alcohol Use: Yes     Comment: occasional glass of wine  . Drug Use: No  . Sexual Activity: None   Other Topics Concern  . None   Social History Narrative  . None   Past Surgical History  Procedure Laterality Date  . Retinal detachment surgery   9/13   Past Medical History  Diagnosis Date  . Cervical facet syndrome   . Enthesopathy of hip region   . Synovitis and tenosynovitis   . Disorders of sacrum   . Sciatic neuritis   . Depression    BP 151/79  Pulse 73  Resp 14  Ht 5\' 3"  (1.6 m)  Wt 144 lb (65.318 kg)  BMI 25.51 kg/m2  SpO2 95%  Opioid Risk Score:   Fall Risk Score: Moderate Fall Risk (6-13 points) (patient educated handout declined)   Review of Systems  Musculoskeletal: Positive for back pain.  All other systems reviewed and are negative.      Objective:   Physical Exam  Physical Exam  The patient is alert and appropriate. Pupils equal round and reactive to light. Extraocular eye movements intact. Heart is regular chest is clear and abdomen is soft and nontender. Low back is somewhat tender still around the operative site and inferiorly. She has pain with palpation to the right PSIS area and down into the mid buttock. Strength is generally intact at 4+ to 5 out of 5. Reflexes appear symmetrical and are 1-2+ bilaterally. Cognitively she is alert and appropriate. Behaviorally she is intact. Gait is normal.  Assessment & Plan:   ASSESSMENT:  1. Right buttock pain and post laminectomy syndrome.  2. Left leg discrepancy.  3. New onset left facial numbness -likely isolated palsy (? CN V)--resolved    PLAN:  1. Gabapentin for neuropathic pain. 2. Percocet 10/325 one p.o. q.6 h. p.r.n., #100  3. Maintain fentanyl at 59mcg/hr as this seems to be working well for her.. Second rx'es were given of narcotics for next month. 4. Continue with HEP as she's doing. Overall she looks good. 5. Her questions were encouraged and answered.  She will follow up in 2 months with our NP unless otherwise directed by our office.

## 2013-06-29 NOTE — Patient Instructions (Signed)
[facility]

## 2013-07-18 ENCOUNTER — Encounter: Payer: Self-pay | Admitting: Physical Medicine & Rehabilitation

## 2013-07-18 ENCOUNTER — Other Ambulatory Visit: Payer: Self-pay | Admitting: Physical Medicine & Rehabilitation

## 2013-08-13 ENCOUNTER — Other Ambulatory Visit: Payer: Self-pay | Admitting: Physical Medicine & Rehabilitation

## 2013-08-22 ENCOUNTER — Encounter: Payer: Medicare Other | Attending: Physical Medicine and Rehabilitation | Admitting: Registered Nurse

## 2013-08-22 ENCOUNTER — Encounter: Payer: Self-pay | Admitting: Registered Nurse

## 2013-08-22 VITALS — BP 152/94 | HR 75 | Resp 14 | Wt 141.2 lb

## 2013-08-22 DIAGNOSIS — Z79899 Other long term (current) drug therapy: Secondary | ICD-10-CM

## 2013-08-22 DIAGNOSIS — M533 Sacrococcygeal disorders, not elsewhere classified: Secondary | ICD-10-CM

## 2013-08-22 DIAGNOSIS — Z5181 Encounter for therapeutic drug level monitoring: Secondary | ICD-10-CM

## 2013-08-22 DIAGNOSIS — M2170 Unequal limb length (acquired), unspecified site: Secondary | ICD-10-CM

## 2013-08-22 DIAGNOSIS — M961 Postlaminectomy syndrome, not elsewhere classified: Secondary | ICD-10-CM

## 2013-08-22 MED ORDER — OXYCODONE-ACETAMINOPHEN 10-325 MG PO TABS: 1.0000 | ORAL_TABLET | Freq: Four times a day (QID) | ORAL | Status: DC | PRN

## 2013-08-22 MED ORDER — FENTANYL 50 MCG/HR TD PT72: 50.0000 ug | MEDICATED_PATCH | TRANSDERMAL | Status: DC

## 2013-08-22 NOTE — Progress Notes (Signed)
Subjective:    Patient ID: Alexandria Mclaughlin, female    DOB: 10/12/1940, 73 y.o.   MRN: [id number]  HPI: [name] is a 73 year old female who returns for follow up for chronic pain and medication refill. She says her pain is located in her right buttock. She rates her pain 4. Her current exercise regime is walking and performing stretching exercises. She uses heat therapy and Tens Unit in conjunction with pain medication for relief.   Pain Inventory Average Pain 4 Pain Right Now 4 My pain is intermittent, sharp and stabbing  In the last 24 hours, has pain interfered with the following? General activity 3 Relation with others 3 Enjoyment of life 4 What TIME of day is your pain at its worst? daytime Sleep (in general) Fair  Pain is worse with: sitting and some activites Pain improves with: heat/ice, therapy/exercise, medication and TENS Relief from Meds: 5  Mobility Do you have any goals in this area?  no  Function retired  Neuro/Psych No problems in this area  Prior Studies Any changes since last visit?  no  Physicians involved in your care Any changes since last visit?  no   Family History  Problem Relation Age of Onset  . Diabetes Mother   . Heart disease Mother   . Heart disease Father   . Heart disease Brother    History   Social History  . Marital Status: Widowed    Spouse Name: N/A    Number of Children: N/A  . Years of Education: N/A   Social History Main Topics  . Smoking status: Former Smoker -- 0.50 packs/day for 5 years    Types: Cigarettes    Quit date: 03/15/1968  . Smokeless tobacco: None  . Alcohol Use: Yes     Comment: occasional glass of wine  . Drug Use: No  . Sexual Activity: None   Other Topics Concern  . None   Social History Narrative  . None   Past Surgical History  Procedure Laterality Date  . Retinal detachment surgery  9/13   Past Medical History  Diagnosis Date  . Cervical facet syndrome   . Enthesopathy of hip  region   . Synovitis and tenosynovitis   . Disorders of sacrum   . Sciatic neuritis   . Depression    BP 152/94  Pulse 75  Resp 14  Wt 141 lb 3.2 oz (64.048 kg)  SpO2 97%  Opioid Risk Score:   Fall Risk Score: Moderate Fall Risk (6-13 points) (previously educated and given handout for fall prevention in the home) Review of Systems  Gastrointestinal: Positive for constipation.  Musculoskeletal: Positive for back pain.  All other systems reviewed and are negative.      Objective:   Physical Exam  Nursing note and vitals reviewed. Constitutional: She is oriented to person, place, and time. She appears well-developed and well-nourished.  HENT:  Head: Normocephalic and atraumatic.  Neck: Normal range of motion. Neck supple.  Cardiovascular: Normal rate, regular rhythm and normal heart sounds.   Pulmonary/Chest: Effort normal and breath sounds normal.  Musculoskeletal:  Normal Muscle Bulk and Muscle Testing Reveals: Upper Extremities: Full ROM and Muscle Strength 4/5. Spinal Forward Flexion 90 degrees Back without spinal or paraspinal tenderness noted. Right Gluteal Maximus Tenderness Lower Extremities: Full ROM and Muscle Strength 5/5. Right Leg Flexion Produces Pain into Right Buttock, Arises from Chair with ease Narrow based Gait    Neurological: She is alert  and oriented to person, place, and time.  Skin: Skin is warm and dry.  Psychiatric: She has a normal mood and affect.          Assessment & Plan:  1. Post laminectomy syndrome with right buttock pain: Pain controlled on Fentanyl and Percocet.  Refilled: Fentanyl patch 50 mcg # 10--change every 3 days and  Percocet 10/325 mg # 100--use one every 6 hours as needed.  2.HTN: PMD Following  20 minutes of face to face patient care time was spent during this visit. All questions were encouraged and answered.   F/U in 1 month

## 2013-08-30 ENCOUNTER — Ambulatory Visit: Payer: Medicare Other | Admitting: Registered Nurse

## 2013-09-20 ENCOUNTER — Encounter: Payer: Self-pay | Admitting: Registered Nurse

## 2013-09-20 ENCOUNTER — Encounter: Payer: Medicare Other | Attending: Physical Medicine and Rehabilitation | Admitting: Registered Nurse

## 2013-09-20 VITALS — BP 146/89 | HR 77 | Resp 14 | Wt 143.8 lb

## 2013-09-20 DIAGNOSIS — Z79899 Other long term (current) drug therapy: Secondary | ICD-10-CM

## 2013-09-20 DIAGNOSIS — M533 Sacrococcygeal disorders, not elsewhere classified: Secondary | ICD-10-CM

## 2013-09-20 DIAGNOSIS — M961 Postlaminectomy syndrome, not elsewhere classified: Secondary | ICD-10-CM

## 2013-09-20 DIAGNOSIS — M2170 Unequal limb length (acquired), unspecified site: Secondary | ICD-10-CM | POA: Diagnosis present

## 2013-09-20 DIAGNOSIS — Z5181 Encounter for therapeutic drug level monitoring: Secondary | ICD-10-CM

## 2013-09-20 MED ORDER — ZOLPIDEM TARTRATE 5 MG PO TABS: 5.0000 mg | ORAL_TABLET | Freq: Every evening | ORAL | Status: DC | PRN

## 2013-09-20 MED ORDER — FENTANYL 75 MCG/HR TD PT72: 75.0000 ug | MEDICATED_PATCH | TRANSDERMAL | Status: DC

## 2013-09-20 MED ORDER — OXYCODONE-ACETAMINOPHEN 10-325 MG PO TABS: 1.0000 | ORAL_TABLET | Freq: Four times a day (QID) | ORAL | Status: DC | PRN

## 2013-09-20 NOTE — Progress Notes (Signed)
Subjective:    Patient ID: Alexandria Mclaughlin, female    DOB: 05-12-1940, 73 y.o.   MRN: [id number]  HPI: Alexandria Mclaughlin is a 73 year old female who returns for follow up for chronic pain and medication refill. She says her pain is located in her right buttock. She rates her pain 6. Her current exercise regime is walking and performing stretching exercises. She continues to use heat therapy and Tens Unit in conjunction with pain medication for relief.  She says her pain has increased in intensity lately, she was wondering if she could have increase in her fentanyl dose. I reviewed her medication history last dose of Fentanyl 75 mcg in 2013. I will increase her Fentanyl this month and will re-evaluate next month. She verbalizes understanding.    Pain Inventory Average Pain 6 Pain Right Now 6 My pain is sharp and stabbing  In the last 24 hours, has pain interfered with the following? General activity 6 Relation with others 5 Enjoyment of life 6 What TIME of day is your pain at its worst? daytime Sleep (in general) Good  Pain is worse with: sitting and standing Pain improves with: heat/ice, therapy/exercise, medication and TENS Relief from Meds: 5  Mobility walk without assistance Do you have any goals in this area?  no  Function Do you have any goals in this area?  no  Neuro/Psych No problems in this area  Prior Studies Any changes since last visit?  no  Physicians involved in your care Any changes since last visit?  no   Family History  Problem Relation Age of Onset  . Diabetes Mother   . Heart disease Mother   . Heart disease Father   . Heart disease Brother    History   Social History  . Marital Status: Widowed    Spouse Name: N/A    Number of Children: N/A  . Years of Education: N/A   Social History Main Topics  . Smoking status: Former Smoker -- 0.50 packs/day for 5 years    Types: Cigarettes    Quit date: 03/15/1968  . Smokeless tobacco: None  . Alcohol  Use: Yes     Comment: occasional glass of wine  . Drug Use: No  . Sexual Activity: None   Other Topics Concern  . None   Social History Narrative  . None   Past Surgical History  Procedure Laterality Date  . Retinal detachment surgery  9/13   Past Medical History  Diagnosis Date  . Cervical facet syndrome   . Enthesopathy of hip region   . Synovitis and tenosynovitis   . Disorders of sacrum   . Sciatic neuritis   . Depression    BP 146/89  Pulse 77  Resp 14  Wt 143 lb 12.8 oz (65.227 kg)  SpO2 99%  Opioid Risk Score:   Fall Risk Score: Moderate Fall Risk (6-13 points) (previously educated and given handout)  Review of Systems  Musculoskeletal: Positive for back pain.  All other systems reviewed and are negative.      Objective:   Physical Exam  Nursing note and vitals reviewed. Constitutional: She is oriented to person, place, and time. She appears well-developed and well-nourished.  HENT:  Head: Normocephalic and atraumatic.  Neck: Normal range of motion. Neck supple.  Cardiovascular: Normal rate and regular rhythm.   Pulmonary/Chest: Effort normal and breath sounds normal.  Musculoskeletal:  Normal Muscle Bulk and Muscle Testing Reveals: Upper Extremities: Full ROM and  Muscle Strength 5/5 Spinal Forward Flexion 45 Degrees and Extension 20 Degrees Right Gluteal Maximus Tenderness Lower Extremities: Full ROM and Muscle Strength 5/5 Arises from chair with ease Narrow based gait  Neurological: She is alert and oriented to person, place, and time.  Skin: Skin is warm and dry.  Psychiatric: She has a normal mood and affect.          Assessment & Plan:  1. Post laminectomy syndrome with right buttock pain: Pain controlled on Fentanyl and Percocet.  RX: Fentanyl patch 75 mcg # 10- one patch every 3 days and Percocet 10/325 mg # 100--use one every 6 hours as needed.  2.HTN: PMD Following   20 minutes of face to face patient care time was spent during  this visit. All questions were encouraged and answered.   F/U in 1 month

## 2013-10-17 ENCOUNTER — Other Ambulatory Visit: Payer: Self-pay | Admitting: Physical Medicine & Rehabilitation

## 2013-10-18 ENCOUNTER — Encounter: Payer: Self-pay | Admitting: Registered Nurse

## 2013-10-18 ENCOUNTER — Encounter: Payer: Medicare Other | Attending: Physical Medicine and Rehabilitation | Admitting: Registered Nurse

## 2013-10-18 VITALS — BP 130/87 | HR 84 | Resp 14 | Ht 63.0 in | Wt 140.0 lb

## 2013-10-18 DIAGNOSIS — M533 Sacrococcygeal disorders, not elsewhere classified: Secondary | ICD-10-CM | POA: Diagnosis present

## 2013-10-18 DIAGNOSIS — M2170 Unequal limb length (acquired), unspecified site: Secondary | ICD-10-CM | POA: Insufficient documentation

## 2013-10-18 DIAGNOSIS — M961 Postlaminectomy syndrome, not elsewhere classified: Secondary | ICD-10-CM

## 2013-10-18 DIAGNOSIS — Z5181 Encounter for therapeutic drug level monitoring: Secondary | ICD-10-CM

## 2013-10-18 DIAGNOSIS — Z79899 Other long term (current) drug therapy: Secondary | ICD-10-CM

## 2013-10-18 MED ORDER — OXYCODONE-ACETAMINOPHEN 10-325 MG PO TABS: 1.0000 | ORAL_TABLET | Freq: Four times a day (QID) | ORAL | Status: DC | PRN

## 2013-10-18 MED ORDER — FENTANYL 75 MCG/HR TD PT72: 75.0000 ug | MEDICATED_PATCH | TRANSDERMAL | Status: DC

## 2013-10-18 NOTE — Progress Notes (Signed)
Subjective:    Patient ID: Alexandria Mclaughlin, female    DOB: [birth year], 73 y.o.   MRN: [id number]  HPI: Ms. MAHIRA PETRAS is a 73 year old female who returns for follow up for chronic pain and medication refill. She says her pain is located in her lower back and right buttock. She rates her pain 6. Her current exercise regime is walking.   Pain Inventory Average Pain 6 Pain Right Now 6 My pain is intermittent, sharp and stabbing  In the last 24 hours, has pain interfered with the following? General activity 5 Relation with others 5 Enjoyment of life 5 What TIME of day is your pain at its worst? daytime Sleep (in general) Fair  Pain is worse with: sitting and standing Pain improves with: heat/ice and medication Relief from Meds: 5  Mobility walk without assistance how many minutes can you walk? 45 ability to climb steps?  yes do you drive?  yes transfers alone Do you have any goals in this area?  no  Function retired Do you have any goals in this area?  no  Neuro/Psych No problems in this area  Prior Studies Any changes since last visit?  no  Physicians involved in your care Any changes since last visit?  no   Family History  Problem Relation Age of Onset  . Diabetes Mother   . Heart disease Mother   . Heart disease Father   . Heart disease Brother    History   Social History  . Marital Status: Widowed    Spouse Name: N/A    Number of Children: N/A  . Years of Education: N/A   Social History Main Topics  . Smoking status: Former Smoker -- 0.50 packs/day for 5 years    Types: Cigarettes    Quit date: 03/15/1968  . Smokeless tobacco: None  . Alcohol Use: Yes     Comment: occasional glass of wine  . Drug Use: No  . Sexual Activity: None   Other Topics Concern  . None   Social History Narrative  . None   Past Surgical History  Procedure Laterality Date  . Retinal detachment surgery  9/13   Past Medical History  Diagnosis Date  . Cervical facet  syndrome   . Enthesopathy of hip region   . Synovitis and tenosynovitis   . Disorders of sacrum   . Sciatic neuritis   . Depression    BP 130/87  Pulse 84  Resp 14  Ht 5\' 3"  (1.6 m)  Wt 140 lb (63.504 kg)  BMI 24.81 kg/m2  SpO2 95%  Opioid Risk Score:   Fall Risk Score: Moderate Fall Risk (6-13 points) (pt educated, declined handout)   Review of Systems  Gastrointestinal: Positive for constipation.  Musculoskeletal: Positive for back pain.  All other systems reviewed and are negative.      Objective:   Physical Exam  Nursing note and vitals reviewed. Constitutional: She is oriented to person, place, and time. She appears well-developed and well-nourished.  HENT:  Head: Normocephalic and atraumatic.  Neck: Normal range of motion. Neck supple.  Cardiovascular: Normal rate, regular rhythm and normal heart sounds.   Pulmonary/Chest: Effort normal and breath sounds normal.  Musculoskeletal:  Normal Muscle Bulk and Muscle testing Reveals: Upper extremities: Full ROM and Muscle strength 5/5 Lumbar Paraspinal tenderness: L-3- L-5 Right Gluteal Maximus Tenderness Lower Extremities: Full ROM and Muscle Strength 5/5 Arises from the chair with ease Narrow based gait  Neurological: She  is alert and oriented to person, place, and time.  Skin: Skin is warm and dry.  Psychiatric: She has a normal mood and affect.          Assessment & Plan:  1. Post laminectomy syndrome with right buttock pain: Pain controlled on Fentanyl and Percocet.  RX: Fentanyl patch 75 mcg # 10- one patch every 3 days and Percocet 10/325 mg # 100--use one every 6 hours as needed.  2.HTN: PMD Following   15 minutes of face to face patient care time was spent during this visit. All questions were encouraged and answered.   F/U in 1 month

## 2013-11-16 ENCOUNTER — Encounter: Payer: Self-pay | Admitting: Registered Nurse

## 2013-11-16 ENCOUNTER — Encounter: Payer: Medicare Other | Attending: Physical Medicine and Rehabilitation | Admitting: Registered Nurse

## 2013-11-16 VITALS — BP 129/63 | HR 63 | Resp 14 | Ht 63.0 in | Wt 139.6 lb

## 2013-11-16 DIAGNOSIS — Z5181 Encounter for therapeutic drug level monitoring: Secondary | ICD-10-CM

## 2013-11-16 DIAGNOSIS — M533 Sacrococcygeal disorders, not elsewhere classified: Secondary | ICD-10-CM

## 2013-11-16 DIAGNOSIS — M961 Postlaminectomy syndrome, not elsewhere classified: Secondary | ICD-10-CM

## 2013-11-16 DIAGNOSIS — M2170 Unequal limb length (acquired), unspecified site: Secondary | ICD-10-CM

## 2013-11-16 DIAGNOSIS — Z79899 Other long term (current) drug therapy: Secondary | ICD-10-CM

## 2013-11-16 MED ORDER — FENTANYL 75 MCG/HR TD PT72: 75.0000 ug | MEDICATED_PATCH | TRANSDERMAL | Status: DC

## 2013-11-16 MED ORDER — OXYCODONE-ACETAMINOPHEN 10-325 MG PO TABS: 1.0000 | ORAL_TABLET | Freq: Four times a day (QID) | ORAL | Status: DC | PRN

## 2013-11-16 NOTE — Progress Notes (Signed)
Subjective:    Patient ID: Alexandria Mclaughlin, female    DOB: 05/08/1940, 73 y.o.   MRN: [id number]  HPI: Alexandria Mclaughlin is a 73 year old female who returns for follow up for chronic pain and medication refill. She says her pain is located in her right buttock. She rates her pain 4. Her current exercise regime is walking.   Pain Inventory Average Pain 4 Pain Right Now 4 My pain is intermittent and stabbing  In the last 24 hours, has pain interfered with the following? General activity 4 Relation with others 3 Enjoyment of life 4 What TIME of day is your pain at its worst? daytime Sleep (in general) Fair  Pain is worse with: sitting and standing Pain improves with: heat/ice, medication and TENS Relief from Meds: 6  Mobility walk without assistance  Function retired  Neuro/Psych No problems in this area  Prior Studies Any changes since last visit?  no  Physicians involved in your care Any changes since last visit?  no   Family History  Problem Relation Age of Onset  . Diabetes Mother   . Heart disease Mother   . Heart disease Father   . Heart disease Brother    History   Social History  . Marital Status: Widowed    Spouse Name: N/A    Number of Children: N/A  . Years of Education: N/A   Social History Main Topics  . Smoking status: Former Smoker -- 0.50 packs/day for 5 years    Types: Cigarettes    Quit date: 03/15/1968  . Smokeless tobacco: None  . Alcohol Use: Yes     Comment: occasional glass of wine  . Drug Use: No  . Sexual Activity: None   Other Topics Concern  . None   Social History Narrative  . None   Past Surgical History  Procedure Laterality Date  . Retinal detachment surgery  9/13   Past Medical History  Diagnosis Date  . Cervical facet syndrome   . Enthesopathy of hip region   . Synovitis and tenosynovitis   . Disorders of sacrum   . Sciatic neuritis   . Depression    BP 129/63  Pulse 63  Resp 14  Ht 5\' 3"  (1.6 m)  Wt 139  lb 9.6 oz (63.322 kg)  BMI 24.74 kg/m2  SpO2 94%  Opioid Risk Score:   Fall Risk Score: Moderate Fall Risk (6-13 points)   Review of Systems     Objective:   Physical Exam  Nursing note and vitals reviewed. Constitutional: She is oriented to person, place, and time. She appears well-developed and well-nourished.  HENT:  Head: Normocephalic and atraumatic.  Neck: Normal range of motion. Neck supple.  Cardiovascular: Normal rate and regular rhythm.   Pulmonary/Chest: Effort normal and breath sounds normal.  Musculoskeletal:  Normal Muscle Bulk and Muscle testing Reveals: Upper Extremities: Full ROM and Muscle strength 5/5 Back without spinal or paraspinal tenderness noted Right Gluteal Maximus Tenderness Lower extremities: Full ROM and Muscle Strength 5/5 Arises from chair with ease Narrow Based Gait  Neurological: She is alert and oriented to person, place, and time.  Skin: Skin is warm and dry.  Psychiatric: She has a normal mood and affect.          Assessment & Plan:  1. Post laminectomy syndrome with right buttock pain: Pain controlled on Fentanyl and Percocet.  Refilled: Fentanyl patch 75 mcg # 10- one patch every 3 days and Percocet  10/325 mg # 100--use one every 6 hours as needed.  2.HTN: PMD Following : Controlled  15 minutes of face to face patient care time was spent during this visit. All questions were encouraged and answered.   F/U in 1 month

## 2013-12-09 ENCOUNTER — Other Ambulatory Visit: Payer: Self-pay

## 2013-12-14 ENCOUNTER — Other Ambulatory Visit: Payer: Self-pay | Admitting: *Deleted

## 2013-12-14 DIAGNOSIS — M533 Sacrococcygeal disorders, not elsewhere classified: Secondary | ICD-10-CM

## 2013-12-14 DIAGNOSIS — M2170 Unequal limb length (acquired), unspecified site: Secondary | ICD-10-CM

## 2013-12-14 MED ORDER — FENTANYL 75 MCG/HR TD PT72: 75.0000 ug | MEDICATED_PATCH | TRANSDERMAL | Status: DC

## 2013-12-14 MED ORDER — OXYCODONE-ACETAMINOPHEN 10-325 MG PO TABS: 1.0000 | ORAL_TABLET | Freq: Four times a day (QID) | ORAL | Status: DC | PRN

## 2013-12-14 NOTE — Telephone Encounter (Signed)
Narcotic rx printed for MD to sign for RN med refill visit 

## 2013-12-15 ENCOUNTER — Encounter: Payer: Medicare Other | Admitting: Registered Nurse

## 2013-12-15 ENCOUNTER — Encounter: Payer: Medicare Other | Attending: Physical Medicine and Rehabilitation | Admitting: *Deleted

## 2013-12-15 VITALS — BP 150/78 | HR 68 | Resp 14 | Ht 63.0 in | Wt 138.8 lb

## 2013-12-15 DIAGNOSIS — M533 Sacrococcygeal disorders, not elsewhere classified: Secondary | ICD-10-CM

## 2013-12-15 DIAGNOSIS — Z760 Encounter for issue of repeat prescription: Secondary | ICD-10-CM | POA: Diagnosis present

## 2013-12-15 DIAGNOSIS — Z5181 Encounter for therapeutic drug level monitoring: Secondary | ICD-10-CM

## 2013-12-15 DIAGNOSIS — M961 Postlaminectomy syndrome, not elsewhere classified: Secondary | ICD-10-CM

## 2013-12-19 NOTE — Progress Notes (Signed)
Here for pill count and med refill.  Pill and patch count appropriate.  Refills given.  Return to see NP next month on 01/12/14

## 2014-01-05 ENCOUNTER — Other Ambulatory Visit: Payer: Self-pay | Admitting: Physical Medicine & Rehabilitation

## 2014-01-12 ENCOUNTER — Encounter: Payer: Medicare Other | Attending: Physical Medicine and Rehabilitation | Admitting: Registered Nurse

## 2014-01-12 ENCOUNTER — Encounter: Payer: Self-pay | Admitting: Registered Nurse

## 2014-01-12 VITALS — BP 131/82 | HR 82 | Resp 14 | Ht 63.0 in | Wt 137.0 lb

## 2014-01-12 DIAGNOSIS — M961 Postlaminectomy syndrome, not elsewhere classified: Secondary | ICD-10-CM

## 2014-01-12 DIAGNOSIS — M2170 Unequal limb length (acquired), unspecified site: Secondary | ICD-10-CM | POA: Diagnosis present

## 2014-01-12 DIAGNOSIS — M533 Sacrococcygeal disorders, not elsewhere classified: Secondary | ICD-10-CM | POA: Insufficient documentation

## 2014-01-12 DIAGNOSIS — Z5181 Encounter for therapeutic drug level monitoring: Secondary | ICD-10-CM

## 2014-01-12 DIAGNOSIS — Z79899 Other long term (current) drug therapy: Secondary | ICD-10-CM

## 2014-01-12 MED ORDER — FENTANYL 75 MCG/HR TD PT72: 75.0000 ug | MEDICATED_PATCH | TRANSDERMAL | Status: DC

## 2014-01-12 MED ORDER — OXYCODONE-ACETAMINOPHEN 10-325 MG PO TABS: 1.0000 | ORAL_TABLET | Freq: Four times a day (QID) | ORAL | Status: DC | PRN

## 2014-01-12 NOTE — Progress Notes (Signed)
Subjective:    Patient ID: Alexandria Mclaughlin, female    DOB: 07/14/1940, 73 y.o.   MRN: [id number]  HPI: Alexandria Mclaughlin is a 73 year old female who returns for follow up for chronic pain and medication refill. She says her pain is located in her right buttock occasionally the pain radiates down her right leg. She rates her pain 4. Her current exercise regime is walking  Pain Inventory Average Pain 4 Pain Right Now 4 My pain is intermittent, sharp and stabbing  In the last 24 hours, has pain interfered with the following? General activity 4 Relation with others 6 Enjoyment of life 5 What TIME of day is your pain at its worst? daytime Sleep (in general) Good  Pain is worse with: sitting and standing Pain improves with: rest, heat/ice, medication and TENS Relief from Meds: 6  Mobility Do you have any goals in this area?  no  Function Do you have any goals in this area?  no  Neuro/Psych No problems in this area  Prior Studies Any changes since last visit?  no  Physicians involved in your care Any changes since last visit?  no   Family History  Problem Relation Age of Onset  . Diabetes Mother   . Heart disease Mother   . Heart disease Father   . Heart disease Brother    History   Social History  . Marital Status: Widowed    Spouse Name: N/A    Number of Children: N/A  . Years of Education: N/A   Social History Main Topics  . Smoking status: Former Smoker -- 0.50 packs/day for 5 years    Types: Cigarettes    Quit date: 03/15/1968  . Smokeless tobacco: None  . Alcohol Use: Yes     Comment: occasional glass of wine  . Drug Use: No  . Sexual Activity: None   Other Topics Concern  . None   Social History Narrative   Past Surgical History  Procedure Laterality Date  . Retinal detachment surgery  9/13   Past Medical History  Diagnosis Date  . Cervical facet syndrome   . Enthesopathy of hip region   . Synovitis and tenosynovitis   . Disorders of sacrum     . Sciatic neuritis   . Depression    BP 131/82 mmHg  Pulse 82  Resp 14  Ht 5\' 3"  (1.6 m)  Wt 137 lb (62.143 kg)  BMI 24.27 kg/m2  SpO2 90%  Opioid Risk Score:   Fall Risk Score: Moderate Fall Risk (6-13 points) Review of Systems  All other systems reviewed and are negative.      Objective:   Physical Exam  Constitutional: She is oriented to person, place, and time. She appears well-developed and well-nourished.  HENT:  Head: Normocephalic and atraumatic.  Neck: Normal range of motion. Neck supple.  Cardiovascular: Normal rate and regular rhythm.   Pulmonary/Chest: Effort normal and breath sounds normal.  Musculoskeletal:  Normal Muscle Bulk and Muscle testing Reveals: Upper Extremities: Full ROM and Muscle Strength 5/5 Right Buttock Tenderness ( Sacral) Lower Extremities: Full ROM and Muscle Strength 5/5 Arises from chair with ease Narrow Based gait  Neurological: She is alert and oriented to person, place, and time.  Skin: Skin is warm and dry.  Psychiatric: She has a normal mood and affect.  Nursing note and vitals reviewed.         Assessment & Plan:  1. Post laminectomy syndrome with right  buttock pain: Pain controlled on Fentanyl and Percocet.  Refilled: Fentanyl patch 75 mcg # 10- one patch every 3 days and Percocet 10/325 mg # 100--use one every 6 hours as needed.  2.HTN: PMD Following :On Accupril  15 minutes of face to face patient care time was spent during this visit. All questions were encouraged and answered.   F/U in 1 month

## 2014-02-09 ENCOUNTER — Encounter: Payer: Medicare Other | Attending: Physical Medicine and Rehabilitation | Admitting: Registered Nurse

## 2014-02-09 ENCOUNTER — Encounter: Payer: Self-pay | Admitting: Registered Nurse

## 2014-02-09 VITALS — BP 124/60 | HR 80 | Resp 14

## 2014-02-09 DIAGNOSIS — Z5181 Encounter for therapeutic drug level monitoring: Secondary | ICD-10-CM

## 2014-02-09 DIAGNOSIS — M533 Sacrococcygeal disorders, not elsewhere classified: Secondary | ICD-10-CM | POA: Diagnosis present

## 2014-02-09 DIAGNOSIS — M961 Postlaminectomy syndrome, not elsewhere classified: Secondary | ICD-10-CM

## 2014-02-09 DIAGNOSIS — M2170 Unequal limb length (acquired), unspecified site: Secondary | ICD-10-CM | POA: Diagnosis present

## 2014-02-09 DIAGNOSIS — Z79899 Other long term (current) drug therapy: Secondary | ICD-10-CM

## 2014-02-09 MED ORDER — OXYCODONE-ACETAMINOPHEN 10-325 MG PO TABS: 1.0000 | ORAL_TABLET | Freq: Four times a day (QID) | ORAL | Status: DC | PRN

## 2014-02-09 MED ORDER — FENTANYL 75 MCG/HR TD PT72: 75.0000 ug | MEDICATED_PATCH | TRANSDERMAL | Status: DC

## 2014-02-09 NOTE — Progress Notes (Signed)
Subjective:    Patient ID: Alexandria Mclaughlin, female    DOB: 12/23/1940, 73 y.o.   MRN: [id number]  HPI: Ms. FARIN BUHMAN is a 73 year old female who returns for follow up for chronic pain and medication refill. She says her pain is located in her right buttock.She rates her pain 7. Her current exercise regime is walking  Pain Inventory Average Pain 5 Pain Right Now 7 My pain is intermittent, stabbing and aching  In the last 24 hours, has pain interfered with the following? General activity 4 Relation with others 5 Enjoyment of life 7 What TIME of day is your pain at its worst? daytime Sleep (in general) Fair  Pain is worse with: sitting and standing Pain improves with: rest, heat/ice and medication Relief from Meds: 6  Mobility walk without assistance  Function retired  Neuro/Psych No problems in this area  Prior Studies Any changes since last visit?  no  Physicians involved in your care Any changes since last visit?  no   Family History  Problem Relation Age of Onset  . Diabetes Mother   . Heart disease Mother   . Heart disease Father   . Heart disease Brother    History   Social History  . Marital Status: Widowed    Spouse Name: N/A    Number of Children: N/A  . Years of Education: N/A   Social History Main Topics  . Smoking status: Former Smoker -- 0.50 packs/day for 5 years    Types: Cigarettes    Quit date: 03/15/1968  . Smokeless tobacco: None  . Alcohol Use: Yes     Comment: occasional glass of wine  . Drug Use: No  . Sexual Activity: None   Other Topics Concern  . None   Social History Narrative   Past Surgical History  Procedure Laterality Date  . Retinal detachment surgery  9/13   Past Medical History  Diagnosis Date  . Cervical facet syndrome   . Enthesopathy of hip region   . Synovitis and tenosynovitis   . Disorders of sacrum   . Sciatic neuritis   . Depression    BP 124/60 mmHg  Pulse 80  Resp 14  SpO2 97%  Opioid Risk  Score:   Fall Risk Score: Moderate Fall Risk (6-13 points)   Review of Systems  Constitutional: Negative.   HENT: Negative.   Eyes: Negative.   Respiratory: Negative.   Cardiovascular: Negative.   Gastrointestinal: Negative.   Endocrine: Negative.   Genitourinary: Negative.   Musculoskeletal: Positive for back pain.       Sciatic pain - right side  Skin: Negative.   Allergic/Immunologic: Negative.   Neurological: Negative.   Hematological: Negative.   Psychiatric/Behavioral: Negative.        Objective:   Physical Exam  Constitutional: She is oriented to person, place, and time. She appears well-developed and well-nourished.  HENT:  Head: Normocephalic and atraumatic.  Neck: Normal range of motion. Neck supple.  Cardiovascular: Normal rate and regular rhythm.   Pulmonary/Chest: Effort normal and breath sounds normal.  Musculoskeletal:  Normal Muscle Bulk and Muscle Testing Reveals: Upper Extremities: Full ROM and Muscle strength 5/5 Lower Extremities: Right Sacral Tenderness Full ROM and Muscle strength 5/5 Arises from chair with ease Narrow Based Gait  Neurological: She is alert and oriented to person, place, and time.  Skin: Skin is warm and dry.  Psychiatric: She has a normal mood and affect.  Nursing note and vitals  reviewed.         Assessment & Plan:  1. Post laminectomy syndrome with right buttock pain: Pain controlled on Fentanyl and Percocet.  Refilled: Fentanyl patch 75 mcg # 10- one patch every 3 days and Percocet 10/325 mg # 100--use one every 6 hours as needed.   15 minutes of face to face patient care time was spent during this visit. All questions were encouraged and answered.   F/U in 1 month

## 2014-02-21 ENCOUNTER — Other Ambulatory Visit: Payer: Self-pay | Admitting: Physical Medicine & Rehabilitation

## 2014-03-13 ENCOUNTER — Encounter: Payer: Self-pay | Admitting: Registered Nurse

## 2014-03-13 ENCOUNTER — Encounter: Payer: Medicare Other | Attending: Physical Medicine and Rehabilitation | Admitting: Registered Nurse

## 2014-03-13 VITALS — BP 132/68 | HR 66 | Resp 14

## 2014-03-13 DIAGNOSIS — M533 Sacrococcygeal disorders, not elsewhere classified: Secondary | ICD-10-CM

## 2014-03-13 DIAGNOSIS — M2170 Unequal limb length (acquired), unspecified site: Secondary | ICD-10-CM

## 2014-03-13 DIAGNOSIS — M961 Postlaminectomy syndrome, not elsewhere classified: Secondary | ICD-10-CM

## 2014-03-13 DIAGNOSIS — Z5181 Encounter for therapeutic drug level monitoring: Secondary | ICD-10-CM

## 2014-03-13 DIAGNOSIS — Z79899 Other long term (current) drug therapy: Secondary | ICD-10-CM

## 2014-03-13 MED ORDER — DULOXETINE HCL 30 MG PO CPEP: 30.0000 mg | ORAL_CAPSULE | Freq: Every day | ORAL | Status: DC

## 2014-03-13 MED ORDER — FENTANYL 75 MCG/HR TD PT72: 75.0000 ug | MEDICATED_PATCH | TRANSDERMAL | Status: DC

## 2014-03-13 MED ORDER — OXYCODONE-ACETAMINOPHEN 10-325 MG PO TABS: 1.0000 | ORAL_TABLET | Freq: Four times a day (QID) | ORAL | Status: DC | PRN

## 2014-03-13 MED ORDER — TRAZODONE HCL 100 MG PO TABS: 100.0000 mg | ORAL_TABLET | Freq: Every day | ORAL | Status: DC

## 2014-03-13 NOTE — Progress Notes (Signed)
Subjective:    Patient ID: Alexandria Mclaughlin, female    DOB: 01-Jun-1940, 74 y.o.   MRN: [id number]  HPI: Alexandria Mclaughlin is a 74 year old female who returns for follow up for chronic pain and medication refill. She says her usual pain is located in her right buttock. She's pain free at this time. Last night she rated her pain 4. Her current exercise regime is walking  Pain Inventory Average Pain 4 Pain Right Now 4 My pain is intermittent, sharp, stabbing and aching  In the last 24 hours, has pain interfered with the following? General activity 3 Relation with others 5 Enjoyment of life 5 What TIME of day is your pain at its worst? daytime Sleep (in general) Good  Pain is worse with: sitting and standing Pain improves with: rest, heat/ice, medication and TENS Relief from Meds: 7  Mobility walk without assistance  Function retired  Neuro/Psych No problems in this area  Prior Studies Any changes since last visit?  no  Physicians involved in your care Any changes since last visit?  no   Family History  Problem Relation Age of Onset  . Diabetes Mother   . Heart disease Mother   . Heart disease Father   . Heart disease Brother    History   Social History  . Marital Status: Widowed    Spouse Name: N/A    Number of Children: N/A  . Years of Education: N/A   Social History Main Topics  . Smoking status: Former Smoker -- 0.50 packs/day for 5 years    Types: Cigarettes    Quit date: 03/15/1968  . Smokeless tobacco: None  . Alcohol Use: Yes     Comment: occasional glass of wine  . Drug Use: No  . Sexual Activity: None   Other Topics Concern  . None   Social History Narrative   Past Surgical History  Procedure Laterality Date  . Retinal detachment surgery  9/13   Past Medical History  Diagnosis Date  . Cervical facet syndrome   . Enthesopathy of hip region   . Synovitis and tenosynovitis   . Disorders of sacrum   . Sciatic neuritis   . Depression    BP  132/68 mmHg  Pulse 66  Resp 14  SpO2 97%  Opioid Risk Score:   Fall Risk Score: Moderate Fall Risk (6-13 points)  Review of Systems  Constitutional: Negative.   HENT: Negative.   Eyes: Negative.   Respiratory: Negative.   Cardiovascular: Negative.   Gastrointestinal: Positive for constipation.  Endocrine: Negative.   Genitourinary: Negative.   Musculoskeletal: Positive for back pain.  Skin: Negative.   Allergic/Immunologic: Negative.   Neurological: Negative.   Hematological: Negative.   Psychiatric/Behavioral: Negative.        Objective:   Physical Exam  Constitutional: She is oriented to person, place, and time. She appears well-developed and well-nourished.  HENT:  Head: Normocephalic and atraumatic.  Neck: Normal range of motion. Neck supple.  Cardiovascular: Normal rate and regular rhythm.   Pulmonary/Chest: Effort normal and breath sounds normal.  Musculoskeletal:  Normal Muscle Bulk and Muscle testing Reveals: Upper Extremities: Full ROM and Muscle strength 5/5 Back without spinal or Paraspinal Tenderness Lower Extremities: Full ROM and Muscle strength 5/5 Arises from chair with ease Narrow based gait  Neurological: She is alert and oriented to person, place, and time.  Skin: Skin is warm and dry.  Psychiatric: She has a normal mood and affect.  Nursing note and vitals reviewed.         Assessment & Plan:  1. Post laminectomy syndrome with right buttock pain: Pain controlled on Fentanyl and Percocet.  Refilled: Fentanyl patch 75 mcg # 10- one patch every 3 days and Percocet 10/325 mg # 100--use one every 6 hours as needed.   15 minutes of face to face patient care time was spent during this visit. All questions were encouraged and answered.   F/U in 1 month

## 2014-04-03 ENCOUNTER — Other Ambulatory Visit: Payer: Self-pay | Admitting: Physical Medicine & Rehabilitation

## 2014-04-07 ENCOUNTER — Encounter: Payer: Self-pay | Admitting: Registered Nurse

## 2014-04-07 ENCOUNTER — Other Ambulatory Visit: Payer: Self-pay | Admitting: Physical Medicine & Rehabilitation

## 2014-04-07 ENCOUNTER — Encounter: Payer: Medicare Other | Attending: Physical Medicine and Rehabilitation | Admitting: Registered Nurse

## 2014-04-07 VITALS — BP 150/84 | HR 86 | Resp 14

## 2014-04-07 DIAGNOSIS — M2170 Unequal limb length (acquired), unspecified site: Secondary | ICD-10-CM | POA: Insufficient documentation

## 2014-04-07 DIAGNOSIS — M961 Postlaminectomy syndrome, not elsewhere classified: Secondary | ICD-10-CM

## 2014-04-07 DIAGNOSIS — M533 Sacrococcygeal disorders, not elsewhere classified: Secondary | ICD-10-CM

## 2014-04-07 DIAGNOSIS — Z5181 Encounter for therapeutic drug level monitoring: Secondary | ICD-10-CM

## 2014-04-07 DIAGNOSIS — G894 Chronic pain syndrome: Secondary | ICD-10-CM

## 2014-04-07 DIAGNOSIS — Z79899 Other long term (current) drug therapy: Secondary | ICD-10-CM

## 2014-04-07 MED ORDER — OXYCODONE-ACETAMINOPHEN 10-325 MG PO TABS: 1.0000 | ORAL_TABLET | Freq: Four times a day (QID) | ORAL | Status: DC | PRN

## 2014-04-07 MED ORDER — FENTANYL 75 MCG/HR TD PT72
75.0000 ug | MEDICATED_PATCH | TRANSDERMAL | Status: DC
Start: 2014-04-07 — End: 2014-05-12

## 2014-04-07 NOTE — Progress Notes (Signed)
Subjective:    Patient ID: Alexandria Mclaughlin, female    DOB: 1940/03/17, 74 y.o.   MRN: [id number]  HPI: Alexandria Mclaughlin is a 74 year old female who returns for follow up for chronic pain and medication refill. She says her usual pain is located in her right buttock. She rates her pain 4. Her current exercise regime is walking  Pain Inventory Average Pain 4 Pain Right Now 4 My pain is intermittent, sharp and stabbing  In the last 24 hours, has pain interfered with the following? General activity 4 Relation with others 4 Enjoyment of life 5 What TIME of day is your pain at its worst? daytime Sleep (in general) Poor  Pain is worse with: sitting and some activites Pain improves with: rest, heat/ice, therapy/exercise and medication Relief from Meds: 6  Mobility walk without assistance how many minutes can you walk? 10-15 ability to climb steps?  yes do you drive?  yes  Function retired  Neuro/Psych No problems in this area  Prior Studies Any changes since last visit?  no  Physicians involved in your care Any changes since last visit?  no   Family History  Problem Relation Age of Onset  . Diabetes Mother   . Heart disease Mother   . Heart disease Father   . Heart disease Brother    History   Social History  . Marital Status: Widowed    Spouse Name: N/A  . Number of Children: N/A  . Years of Education: N/A   Social History Main Topics  . Smoking status: Former Smoker -- 0.50 packs/day for 5 years    Types: Cigarettes    Quit date: 03/15/1968  . Smokeless tobacco: Not on file  . Alcohol Use: Yes     Comment: occasional glass of wine  . Drug Use: No  . Sexual Activity: Not on file   Other Topics Concern  . None   Social History Narrative   Past Surgical History  Procedure Laterality Date  . Retinal detachment surgery  9/13   Past Medical History  Diagnosis Date  . Cervical facet syndrome   . Enthesopathy of hip region   . Synovitis and tenosynovitis    . Disorders of sacrum   . Sciatic neuritis   . Depression    BP 150/84 mmHg  Pulse 86  Resp 14  SpO2 97%  Opioid Risk Score:   Fall Risk Score: Moderate Fall Risk (6-13 points)  Review of Systems  Constitutional: Negative.   HENT: Negative.   Eyes: Negative.   Respiratory: Negative.   Cardiovascular: Negative.   Gastrointestinal: Negative.   Endocrine: Negative.   Genitourinary: Negative.   Musculoskeletal: Positive for back pain.  Skin: Negative.   Allergic/Immunologic: Negative.   Neurological: Negative.   Hematological: Negative.   Psychiatric/Behavioral: Negative.        Objective:   Physical Exam  Constitutional: She is oriented to person, place, and time. She appears well-developed and well-nourished.  HENT:  Head: Normocephalic and atraumatic.  Neck: Normal range of motion. Neck supple.  Cardiovascular: Normal rate and regular rhythm.   Pulmonary/Chest: Effort normal and breath sounds normal.  Musculoskeletal:  Normal Muscle Bulk and Muscle Testing Reveals: Upper Extremities: Full ROM and Muscle strength 5/5 Lower Extremities: Full ROM  Right Gluteal Maximus Pain ( Sacral Pain) Right Lower Extremity Flexion Produces pain into Sacrum ( Right) Arises from chair with Ease Narrow Based gait  Neurological: She is alert and oriented to person,  place, and time.  Skin: Skin is warm and dry.  Psychiatric: She has a normal mood and affect.  Nursing note and vitals reviewed.         Assessment & Plan:  1. Post laminectomy syndrome with right buttock pain: Pain controlled on Fentanyl and Percocet.  Refilled: Fentanyl patch 75 mcg # 10- one patch every 3 days and Percocet 10/325 mg # 100--use one every 6 hours as needed.   15 minutes of face to face patient care time was spent during this visit. All questions were encouraged and answered.   F/U in 1 month

## 2014-04-08 LAB — PMP ALCOHOL METABOLITE (ETG): Ethyl Glucuronide (EtG): NEGATIVE ng/mL

## 2014-04-12 LAB — OXYCODONE, URINE (LC/MS-MS)
NOROXYCODONE, UR: 7396 ng/mL (ref <50)
OXYCODONE, UR: 2446 ng/mL (ref <50)
OXYMORPHONE, URINE: 2918 ng/mL (ref <50)

## 2014-04-12 LAB — OPIATES/OPIOIDS (LC/MS-MS)
CODEINE URINE: NEGATIVE ng/mL (ref <50)
HYDROCODONE: NEGATIVE ng/mL (ref <50)
Hydromorphone: NEGATIVE ng/mL (ref <50)
Morphine Urine: NEGATIVE ng/mL (ref <50)
NORHYDROCODONE, UR: NEGATIVE ng/mL (ref <50)
Noroxycodone, Ur: 7396 ng/mL (ref <50)
OXYMORPHONE, URINE: 2918 ng/mL (ref <50)
Oxycodone, ur: 2446 ng/mL (ref <50)

## 2014-04-12 LAB — FENTANYL (GC/LC/MS), URINE
Fentanyl, confirm: 71.3 ng/mL (ref <0.5)
NORFENTANYL (GC/MS) CONFIRM: 852 ng/mL (ref <0.5)

## 2014-04-13 LAB — PRESCRIPTION MONITORING PROFILE (SOLSTAS)
Amphetamine/Meth: NEGATIVE ng/mL
BUPRENORPHINE, URINE: NEGATIVE ng/mL
Barbiturate Screen, Urine: NEGATIVE ng/mL
Benzodiazepine Screen, Urine: NEGATIVE ng/mL
CARISOPRODOL, URINE: NEGATIVE ng/mL
CREATININE, URINE: 138.28 mg/dL (ref >20.0)
Cannabinoid Scrn, Ur: NEGATIVE ng/mL
Cocaine Metabolites: NEGATIVE ng/mL
ECSTASY: NEGATIVE ng/mL
Meperidine, Ur: NEGATIVE ng/mL
Methadone Screen, Urine: NEGATIVE ng/mL
NITRITES URINE, INITIAL: NEGATIVE ug/mL
PH URINE, INITIAL: 5.9 pH (ref 4.5–8.9)
PROPOXYPHENE: NEGATIVE ng/mL
Tapentadol, urine: NEGATIVE ng/mL
Tramadol Scrn, Ur: NEGATIVE ng/mL
ZOLPIDEM, URINE: NEGATIVE ng/mL

## 2014-04-28 NOTE — Progress Notes (Signed)
Urine drug screen for this encounter is consistent for prescribed medication 

## 2014-04-29 ENCOUNTER — Other Ambulatory Visit: Payer: Self-pay | Admitting: Physical Medicine & Rehabilitation

## 2014-05-09 ENCOUNTER — Telehealth: Payer: Self-pay | Admitting: *Deleted

## 2014-05-09 ENCOUNTER — Other Ambulatory Visit: Payer: Self-pay | Admitting: Internal Medicine

## 2014-05-09 DIAGNOSIS — R1032 Left lower quadrant pain: Secondary | ICD-10-CM

## 2014-05-09 NOTE — Telephone Encounter (Signed)
Calling because she is being treated for a UTI and the doctor gave her an rx for diclofenac 50 mg one bid for 20 days.  She is wanting to make sure it is ok for her to  Take this.  I notified her that it is an antinflammatory and would not be a problem.

## 2014-05-10 ENCOUNTER — Ambulatory Visit
Admission: RE | Admit: 2014-05-10 | Discharge: 2014-05-10 | Disposition: A | Discharge status: Home / Self Care | Payer: Medicare Other | Source: Ambulatory Visit | Attending: Internal Medicine | Admitting: Internal Medicine

## 2014-05-10 ENCOUNTER — Other Ambulatory Visit: Payer: Medicare Other

## 2014-05-10 DIAGNOSIS — R1032 Left lower quadrant pain: Secondary | ICD-10-CM

## 2014-05-10 MED ORDER — IOPAMIDOL (ISOVUE-300) INJECTION 61%
100.0000 mL | Freq: Once | INTRAVENOUS | Status: AC | PRN
  Administered 2014-05-10: 100 mL via INTRAVENOUS

## 2014-05-12 ENCOUNTER — Encounter: Payer: Medicare Other | Attending: Physical Medicine and Rehabilitation | Admitting: Registered Nurse

## 2014-05-12 ENCOUNTER — Encounter: Payer: Self-pay | Admitting: Registered Nurse

## 2014-05-12 VITALS — BP 144/89 | HR 90 | Resp 14

## 2014-05-12 DIAGNOSIS — Z5181 Encounter for therapeutic drug level monitoring: Secondary | ICD-10-CM

## 2014-05-12 DIAGNOSIS — M533 Sacrococcygeal disorders, not elsewhere classified: Secondary | ICD-10-CM | POA: Diagnosis not present

## 2014-05-12 DIAGNOSIS — M961 Postlaminectomy syndrome, not elsewhere classified: Secondary | ICD-10-CM

## 2014-05-12 DIAGNOSIS — M545 Low back pain: Secondary | ICD-10-CM

## 2014-05-12 DIAGNOSIS — M2170 Unequal limb length (acquired), unspecified site: Secondary | ICD-10-CM

## 2014-05-12 DIAGNOSIS — Z79899 Other long term (current) drug therapy: Secondary | ICD-10-CM

## 2014-05-12 DIAGNOSIS — G8929 Other chronic pain: Secondary | ICD-10-CM

## 2014-05-12 MED ORDER — OXYCODONE-ACETAMINOPHEN 10-325 MG PO TABS: 1.0000 | ORAL_TABLET | Freq: Four times a day (QID) | ORAL | Status: DC | PRN

## 2014-05-12 MED ORDER — FENTANYL 75 MCG/HR TD PT72: 75.0000 ug | MEDICATED_PATCH | TRANSDERMAL | Status: DC

## 2014-05-12 NOTE — Progress Notes (Signed)
Subjective:    Patient ID: Alexandria Mclaughlin, female    DOB: [birth year], 74 y.o.   MRN: [id number]  HPI:  [name] is a 74 year old female who returns for follow up for chronic pain and medication refill. She says her usual pain is located in her right buttock. She rates her pain 4. She hasn't followed her usual  exercise regime had a UTI. She arrived to office hypertensive she states she just received a call from her PCP office she has pulmonary nodules. PCP following. Emotional support given  Blood Pressure re-checked 139/93 she will keep a blood pressure log and call her PCP.  Pain Inventory Average Pain 4 Pain Right Now 4 My pain is intermittent, sharp and stabbing  In the last 24 hours, has pain interfered with the following? General activity 3 Relation with others 5 Enjoyment of life 5 What TIME of day is your pain at its worst? daytime Sleep (in general) Fair  Pain is worse with: sitting, inactivity and standing Pain improves with: rest, heat/ice and medication Relief from Meds: 6  Mobility Do you have any goals in this area?  no  Function Do you have any goals in this area?  no  Neuro/Psych No problems in this area  Prior Studies Any changes since last visit?  no  Physicians involved in your care Any changes since last visit?  no   Family History  Problem Relation Age of Onset  . Diabetes Mother   . Heart disease Mother   . Heart disease Father   . Heart disease Brother    History   Social History  . Marital Status: Widowed    Spouse Name: N/A  . Number of Children: N/A  . Years of Education: N/A   Social History Main Topics  . Smoking status: Former Smoker -- 0.50 packs/day for 5 years    Types: Cigarettes    Quit date: 03/15/1968  . Smokeless tobacco: Not on file  . Alcohol Use: Yes     Comment: occasional glass of wine  . Drug Use: No  . Sexual Activity: Not on file   Other Topics Concern  . None   Social History Narrative   Past  Surgical History  Procedure Laterality Date  . Retinal detachment surgery  9/13   Past Medical History  Diagnosis Date  . Cervical facet syndrome   . Enthesopathy of hip region   . Synovitis and tenosynovitis   . Disorders of sacrum   . Sciatic neuritis   . Depression    BP 144/89 mmHg  Pulse 90  Resp 14  SpO2 95%  Opioid Risk Score:   Fall Risk Score: Moderate Fall Risk (6-13 points) (previously educated and given handout)`1  Depression screen PHQ 2/9  Depression screen PHQ 2/9 05/12/2014  Decreased Interest 3  Down, Depressed, Hopeless 0  PHQ - 2 Score 3  Altered sleeping 0  Tired, decreased energy 3  Change in appetite 3  Feeling bad or failure about yourself  0  Trouble concentrating 0  Moving slowly or fidgety/restless 0  Suicidal thoughts 0  PHQ-9 Score 9     Review of Systems  Gastrointestinal: Positive for nausea, abdominal pain and diarrhea.  All other systems reviewed and are negative.      Objective:   Physical Exam  Constitutional: She is oriented to person, place, and time. She appears well-developed and well-nourished.  HENT:  Head: Normocephalic and atraumatic.  Neck: Normal range  of motion. Neck supple.  Cardiovascular: Normal rate and regular rhythm.   Pulmonary/Chest: Effort normal and breath sounds normal.  Musculoskeletal:  Normal Muscle Bulk and Muscle Testing Reveals: Upper Extremities:  Full ROM and Muscle Strength 5/5 Sacral Tenderness Noted Lower Extremities: Full ROM and Muscle Strength 5/5 Arises from chair with ease Narrow based gait  Neurological: She is alert and oriented to person, place, and time.  Skin: Skin is warm and dry.  Psychiatric: She has a normal mood and affect.  Nursing note and vitals reviewed.         Assessment & Plan:  1. Post laminectomy syndrome with right buttock pain: Pain controlled on Fentanyl and Percocet.  Refilled: Fentanyl patch 75 mcg # 10- one patch every 3 days and Percocet 10/325 mg #  100--use one every 6 hours as needed.   15 minutes of face to face patient care time was spent during this visit. All questions were encouraged and answered.   F/U in 1 month

## 2014-06-07 ENCOUNTER — Encounter: Payer: Self-pay | Admitting: Registered Nurse

## 2014-06-07 ENCOUNTER — Encounter: Payer: Medicare Other | Attending: Physical Medicine and Rehabilitation | Admitting: Registered Nurse

## 2014-06-07 VITALS — BP 129/93 | HR 90 | Resp 16

## 2014-06-07 DIAGNOSIS — M961 Postlaminectomy syndrome, not elsewhere classified: Secondary | ICD-10-CM | POA: Diagnosis not present

## 2014-06-07 DIAGNOSIS — M533 Sacrococcygeal disorders, not elsewhere classified: Secondary | ICD-10-CM | POA: Diagnosis not present

## 2014-06-07 DIAGNOSIS — M7541 Impingement syndrome of right shoulder: Secondary | ICD-10-CM

## 2014-06-07 DIAGNOSIS — M545 Low back pain, unspecified: Secondary | ICD-10-CM

## 2014-06-07 DIAGNOSIS — G8929 Other chronic pain: Secondary | ICD-10-CM

## 2014-06-07 DIAGNOSIS — M2170 Unequal limb length (acquired), unspecified site: Secondary | ICD-10-CM

## 2014-06-07 MED ORDER — FENTANYL 75 MCG/HR TD PT72: 75.0000 ug | MEDICATED_PATCH | TRANSDERMAL | Status: DC

## 2014-06-07 MED ORDER — TRAZODONE HCL 100 MG PO TABS: 100.0000 mg | ORAL_TABLET | Freq: Every day | ORAL | Status: DC

## 2014-06-07 MED ORDER — OXYCODONE-ACETAMINOPHEN 10-325 MG PO TABS: 1.0000 | ORAL_TABLET | Freq: Four times a day (QID) | ORAL | Status: DC | PRN

## 2014-06-07 NOTE — Progress Notes (Deleted)
   Subjective:    Patient ID: Alexandria Mclaughlin, female    DOB: 1940/09/10, 74 y.o.   MRN: [id number]  HPI: Alexandria Mclaughlin is a 74 year old female who returns for follow up for chronic pain and medication refill. She says her usual pain is located in her right buttock. She rates her pain 4.    Review of Systems     Objective:   Physical Exam        Assessment & Plan:  1. Post laminectomy syndrome with right buttock pain: Pain controlled on Fentanyl and Percocet.  Refilled: Fentanyl patch 75 mcg # 10- one patch every 3 days and Percocet 10/325 mg # 100--use one every 6 hours as needed.   15 minutes of face to face patient care time was spent during this visit. All questions were encouraged and answered.   F/U in 1 month

## 2014-06-07 NOTE — Progress Notes (Deleted)
   Subjective:    Patient ID: [name], female    DOB: [birth year], 74 y.o.   MRN: [id number]  HPI  Pain Inventory Average Pain 6 Pain Right Now 6 My pain is constant, sharp and stabbing  In the last 24 hours, has pain interfered with the following? General activity 5 Relation with others 5 Enjoyment of life 5 What TIME of day is your pain at its worst? daytime Sleep (in general) Good  Pain is worse with: sitting and standing Pain improves with: rest, heat/ice and medication Relief from Meds: 5  Mobility walk without assistance Do you have any goals in this area?  no  Function Do you have any goals in this area?  no  Neuro/Psych depression  Prior Studies Any changes since last visit?  no  Physicians involved in your care Any changes since last visit?  no   Family History  Problem Relation Age of Onset  . Diabetes Mother   . Heart disease Mother   . Heart disease Father   . Heart disease Brother    History   Social History  . Marital Status: Widowed    Spouse Name: N/A  . Number of Children: N/A  . Years of Education: N/A   Social History Main Topics  . Smoking status: Former Smoker -- 0.50 packs/day for 5 years    Types: Cigarettes    Quit date: 03/15/1968  . Smokeless tobacco: Not on file  . Alcohol Use: Yes     Comment: occasional glass of wine  . Drug Use: No  . Sexual Activity: Not on file   Other Topics Concern  . None   Social History Narrative   Past Surgical History  Procedure Laterality Date  . Retinal detachment surgery  9/13   Past Medical History  Diagnosis Date  . Cervical facet syndrome   . Enthesopathy of hip region   . Synovitis and tenosynovitis   . Disorders of sacrum   . Sciatic neuritis   . Depression    BP 129/93 mmHg  Pulse 90  Resp 16  SpO2 97%  Opioid Risk Score:   Fall Risk Score: Moderate Fall Risk (6-13 points) (previously educated and given handout)`1  Depression screen PHQ 2/9  Depression screen  PHQ 2/9 05/12/2014  Decreased Interest 3  Down, Depressed, Hopeless 0  PHQ - 2 Score 3  Altered sleeping 0  Tired, decreased energy 3  Change in appetite 3  Feeling bad or failure about yourself  0  Trouble concentrating 0  Moving slowly or fidgety/restless 0  Suicidal thoughts 0  PHQ-9 Score 9     Review of Systems  Gastrointestinal: Positive for constipation.  Psychiatric/Behavioral: Positive for dysphoric mood.  All other systems reviewed and are negative.      Objective:   Physical Exam        Assessment & Plan:

## 2014-06-07 NOTE — Progress Notes (Signed)
Subjective:    Patient ID: Alexandria Mclaughlin, female    DOB: [birth year], 74 y.o.   MRN: [id number]  HPI: [name] is a 74 year old female who returns for follow up for chronic pain and medication refill. She says her  pain is located in her right shoulder, lower back and right buttock. She rates her pain 6.  Pain Inventory Average Pain 6 Pain Right Now 6 My pain is constant, sharp and stabbing  In the last 24 hours, has pain interfered with the following? General activity 5 Relation with others 5 Enjoyment of life 5 What TIME of day is your pain at its worst? daytime Sleep (in general) Good  Pain is worse with: sitting and standing Pain improves with: rest, heat/ice and medication Relief from Meds: 5  Mobility walk without assistance Do you have any goals in this area?  no  Function Do you have any goals in this area?  no  Neuro/Psych depression  Prior Studies Any changes since last visit?  no  Physicians involved in your care Any changes since last visit?  no   Family History  Problem Relation Age of Onset  . Diabetes Mother   . Heart disease Mother   . Heart disease Father   . Heart disease Brother    History   Social History  . Marital Status: Widowed    Spouse Name: N/A  . Number of Children: N/A  . Years of Education: N/A   Social History Main Topics  . Smoking status: Former Smoker -- 0.50 packs/day for 5 years    Types: Cigarettes    Quit date: 03/15/1968  . Smokeless tobacco: Not on file  . Alcohol Use: Yes     Comment: occasional glass of wine  . Drug Use: No  . Sexual Activity: Not on file   Other Topics Concern  . None   Social History Narrative   Past Surgical History  Procedure Laterality Date  . Retinal detachment surgery  9/13   Past Medical History  Diagnosis Date  . Cervical facet syndrome   . Enthesopathy of hip region   . Synovitis and tenosynovitis   . Disorders of sacrum   . Sciatic neuritis   . Depression    BP  129/93 mmHg  Pulse 90  Resp 16  SpO2 97%  Opioid Risk Score:   Fall Risk Score: Moderate Fall Risk (6-13 points) (previously educated and given handout)`1  Depression screen PHQ 2/9  Depression screen PHQ 2/9 05/12/2014  Decreased Interest 3  Down, Depressed, Hopeless 0  PHQ - 2 Score 3  Altered sleeping 0  Tired, decreased energy 3  Change in appetite 3  Feeling bad or failure about yourself  0  Trouble concentrating 0  Moving slowly or fidgety/restless 0  Suicidal thoughts 0  PHQ-9 Score 9     Review of Systems  Gastrointestinal: Positive for constipation.  Psychiatric/Behavioral: Positive for dysphoric mood.  All other systems reviewed and are negative.      Objective:   Physical Exam  Constitutional: She is oriented to person, place, and time. She appears well-developed and well-nourished.  HENT:  Head: Normocephalic and atraumatic.  Neck: Normal range of motion. Neck supple.  Cardiovascular: Normal rate and regular rhythm.   Pulmonary/Chest: Effort normal and breath sounds normal.  Musculoskeletal:  Normal Muscle Bulk and Muscle Testing Reveals: Upper Extremities: Right: Decreased ROM 90 Degrees and Decreased Adduction and Internal Rotation Right AC Joint Tenderness Left:  Full ROM and Muscle Strength 5/5 Lumbar Paraspinal Tenderness: L-4- L-5 Right Gluteal maximus Tenderness ( Sacrum) Lower Extremities: Full ROM and Muscle Strength 5/5 Arises from chair with ease Narrow Based Gait   Neurological: She is alert and oriented to person, place, and time.  Skin: Skin is warm and dry.  Psychiatric: She has a normal mood and affect.  Nursing note and vitals reviewed.         Assessment & Plan:  1. Post laminectomy syndrome with right buttock pain: Pain controlled on Fentanyl and Percocet.  Refilled: Fentanyl patch 75 mcg # 10- one patch every 3 days and Percocet 10/325 mg # 100--use one every 6 hours as needed.  2. Right Shoulder Impingement : Has an  appointment with Dr. Louanne Skye Orthopedist  15 minutes of face to face patient care time was spent during this visit. All questions were encouraged and answered.   F/U in 1 month

## 2014-06-21 ENCOUNTER — Other Ambulatory Visit: Payer: Self-pay | Admitting: Registered Nurse

## 2014-07-05 ENCOUNTER — Encounter: Payer: Self-pay | Admitting: Physical Medicine & Rehabilitation

## 2014-07-05 ENCOUNTER — Encounter: Payer: Medicare Other | Attending: Physical Medicine and Rehabilitation | Admitting: Physical Medicine & Rehabilitation

## 2014-07-05 VITALS — BP 145/80 | HR 68 | Resp 14

## 2014-07-05 DIAGNOSIS — M533 Sacrococcygeal disorders, not elsewhere classified: Secondary | ICD-10-CM

## 2014-07-05 DIAGNOSIS — M2170 Unequal limb length (acquired), unspecified site: Secondary | ICD-10-CM | POA: Diagnosis present

## 2014-07-05 DIAGNOSIS — M47816 Spondylosis without myelopathy or radiculopathy, lumbar region: Secondary | ICD-10-CM

## 2014-07-05 DIAGNOSIS — M961 Postlaminectomy syndrome, not elsewhere classified: Secondary | ICD-10-CM

## 2014-07-05 HISTORY — DX: Spondylosis without myelopathy or radiculopathy, lumbar region: M47.816

## 2014-07-05 MED ORDER — OXYCODONE-ACETAMINOPHEN 10-325 MG PO TABS: 1.0000 | ORAL_TABLET | Freq: Four times a day (QID) | ORAL | Status: DC | PRN

## 2014-07-05 MED ORDER — FENTANYL 75 MCG/HR TD PT72: 75.0000 ug | MEDICATED_PATCH | TRANSDERMAL | Status: DC

## 2014-07-05 NOTE — Patient Instructions (Signed)
PLEASE CALL ME WITH ANY PROBLEMS OR QUESTIONS (#[phone]).      

## 2014-07-05 NOTE — Progress Notes (Signed)
Subjective:    Patient ID: Alexandria Mclaughlin, female    DOB: [birth year], 74 y.o.   MRN: [id number]  HPI Alexandria Mclaughlin is here in follow of her chronic low back pain. For the past two months, Alexandria Mclaughlin has been having increasing pain in her left low back with some radiation into the left buttock. When she awakens in the morning she reports pain in her left leg and foot. It takes her 15 minutes to "walk off' the pain before she feels comfortable again. The more she walks, however, as the day goes, the more her low back and left buttock begin to bother her. Alexandria Mclaughlin also reports pain in her right low back and buttock (the side which she typically has symptoms) but it's not as bad.   She has been limited in her exercise regimen recently and hasn't been able to go on her trips with the church which she's accustomed to doing.   She remains on the fentanyl patch as well as the oxycodone for pain control which do some good in controlling her symptoms.   She reports no new medical changes otherwise although her mood has not been the best with the pain problems she's been having  Lumbar films were reviewed and there is less than 20% listhesis of L4 on L5 with associated mild to moderate facet disease at this level also.   Pain Inventory Average Pain 7 Pain Right Now 7 My pain is constant, sharp and stabbing  In the last 24 hours, has pain interfered with the following? General activity 7 Relation with others 7 Enjoyment of life 7 What TIME of day is your pain at its worst? daytime Sleep (in general) Good  Pain is worse with: walking and standing Pain improves with: heat/ice and medication Relief from Meds: 3  Mobility walk without assistance how many minutes can you walk? 5 ability to climb steps?  no do you drive?  yes  Function retired  Neuro/Psych weakness  Prior Studies Any changes since last visit?  no  Physicians involved in your care Any changes since last visit?  no   Family History    Problem Relation Age of Onset  . Diabetes Mother   . Heart disease Mother   . Heart disease Father   . Heart disease Brother    History   Social History  . Marital Status: Widowed    Spouse Name: N/A  . Number of Children: N/A  . Years of Education: N/A   Social History Main Topics  . Smoking status: Former Smoker -- 0.50 packs/day for 5 years    Types: Cigarettes    Quit date: 03/15/1968  . Smokeless tobacco: Not on file  . Alcohol Use: Yes     Comment: occasional glass of wine  . Drug Use: No  . Sexual Activity: Not on file   Other Topics Concern  . None   Social History Narrative   Past Surgical History  Procedure Laterality Date  . Retinal detachment surgery  9/13   Past Medical History  Diagnosis Date  . Cervical facet syndrome   . Enthesopathy of hip region   . Synovitis and tenosynovitis   . Disorders of sacrum   . Sciatic neuritis   . Depression    BP 145/80 mmHg  Pulse 68  Resp 14  SpO2 98%  Opioid Risk Score:   Fall Risk Score: Moderate Fall Risk (6-13 points)`1  Depression screen PHQ 2/9  Depression screen [facility] 2/9 05/12/2014  Decreased  Interest 3  Down, Depressed, Hopeless 0  PHQ - 2 Score 3  Altered sleeping 0  Tired, decreased energy 3  Change in appetite 3  Feeling bad or failure about yourself  0  Trouble concentrating 0  Moving slowly or fidgety/restless 0  Suicidal thoughts 0  PHQ-9 Score 9     Review of Systems  HENT: Negative.   Eyes: Negative.   Respiratory: Negative.   Cardiovascular: Negative.   Gastrointestinal: Negative.   Endocrine: Negative.   Genitourinary: Negative.   Musculoskeletal: Positive for myalgias, back pain and arthralgias.       Bilateral leg pain - left is worse  Skin: Negative.   Allergic/Immunologic: Negative.   Neurological: Positive for weakness.       Weakness in left leg  Hematological: Negative.   Psychiatric/Behavioral: Negative.        Objective:   Physical Exam  Physical Exam   The patient is alert and appropriate. Pupils equal round and reactive to light. Extraocular eye movements intact. Heart is regular chest is clear and abdomen is soft and nontender. Low back is somewhat tender still around the operative site and inferiorly. She has pain with palpation to the right PSIS area as well as the left. Facet maneuvers are positive left more so than right today. She has some tenderness with flexion but can bend to nearly 90 degrees today. Side bending seemed to cause pain left more than right.. Strength is generally intact at   5 out of 5 in both legs. Reflexes appear symmetrical  2+ bilaterally. Sensory exam is normal in both legs. . Cognitively she is alert and appropriate. Behaviorally she is intact. Gait is normal.  Psych: pt appears a little flat,depressed  Assessment & Plan:   ASSESSMENT:  1. Lumbar post laminectomy syndrome.  2. Left leg discrepancy.  3. New onset of back pain, now affecting left more than right. Presentation and xrays consistent with listhesis of L4 on L5 and facet syndrome   PLAN:  1. Gabapentin for neuropathic pain.  2. Percocet 10/325 one p.o. q.6 h. p.r.n., #100  3. Maintain fentanyl at 101mcg/hr as this seems to be working well for her.. Second rx'es were given of narcotics for next month.  4. Continue with HEP as she's doing. Overall she looks good.  5. Her questions were encouraged and answered.  She will follow up in 2 months with our NP unless otherwise directed by our office.          Assessment & Plan:

## 2014-07-12 ENCOUNTER — Telehealth: Payer: Self-pay | Admitting: *Deleted

## 2014-07-12 NOTE — Telephone Encounter (Signed)
Patient called the office to let us know that she is scheduled with Dr. Ernestina Patches on Friday for her injections.  Just wanted to let you know.

## 2014-08-02 ENCOUNTER — Other Ambulatory Visit: Payer: Self-pay | Admitting: Registered Nurse

## 2014-08-02 ENCOUNTER — Encounter: Payer: Self-pay | Admitting: Registered Nurse

## 2014-08-02 ENCOUNTER — Encounter: Payer: Medicare Other | Attending: Physical Medicine and Rehabilitation | Admitting: Registered Nurse

## 2014-08-02 VITALS — BP 153/88 | HR 56 | Resp 16

## 2014-08-02 DIAGNOSIS — Z5181 Encounter for therapeutic drug level monitoring: Secondary | ICD-10-CM | POA: Diagnosis not present

## 2014-08-02 DIAGNOSIS — M545 Low back pain, unspecified: Secondary | ICD-10-CM

## 2014-08-02 DIAGNOSIS — M47816 Spondylosis without myelopathy or radiculopathy, lumbar region: Secondary | ICD-10-CM

## 2014-08-02 DIAGNOSIS — M2170 Unequal limb length (acquired), unspecified site: Secondary | ICD-10-CM | POA: Insufficient documentation

## 2014-08-02 DIAGNOSIS — Z79899 Other long term (current) drug therapy: Secondary | ICD-10-CM | POA: Diagnosis not present

## 2014-08-02 DIAGNOSIS — M533 Sacrococcygeal disorders, not elsewhere classified: Secondary | ICD-10-CM | POA: Diagnosis not present

## 2014-08-02 DIAGNOSIS — G8929 Other chronic pain: Secondary | ICD-10-CM

## 2014-08-02 DIAGNOSIS — M961 Postlaminectomy syndrome, not elsewhere classified: Secondary | ICD-10-CM

## 2014-08-02 MED ORDER — OXYCODONE-ACETAMINOPHEN 10-325 MG PO TABS: 1.0000 | ORAL_TABLET | Freq: Four times a day (QID) | ORAL | Status: DC | PRN

## 2014-08-02 MED ORDER — FENTANYL 75 MCG/HR TD PT72: 75.0000 ug | MEDICATED_PATCH | TRANSDERMAL | Status: DC

## 2014-08-02 MED ORDER — FENTANYL 75 MCG/HR TD PT72
75.0000 ug | MEDICATED_PATCH | TRANSDERMAL | Status: DC
Start: 2014-08-02 — End: 2014-08-02

## 2014-08-02 NOTE — Progress Notes (Signed)
Subjective:    Patient ID: Alexandria Mclaughlin, female    DOB: [birth year], 74 y.o.   MRN: [id number]  HPI: Alexandria Mclaughlin is a 74 year old female who returns for follow up for chronic pain and medication refill. She says her pain is located in her right buttock ( sacral ). She rates her pain 6. Her current exercise regime is walking and swimming. Also states she had Bilateral Lumbar Facet Injection via Dr. Ernestina Patches three weeks ago, had relief on her left side.  Pain Inventory Average Pain 6 Pain Right Now 6 My pain is constant, sharp and stabbing  In the last 24 hours, has pain interfered with the following? General activity 6 Relation with others 6 Enjoyment of life 6 What TIME of day is your pain at its worst? daytime Sleep (in general) Good  Pain is worse with: sitting and standing Pain improves with: heat/ice and medication Relief from Meds: 5  Mobility Do you have any goals in this area?  no  Function Do you have any goals in this area?  no  Neuro/Psych No problems in this area  Prior Studies Any changes since last visit?  no  Physicians involved in your care Any changes since last visit?  no   Family History  Problem Relation Age of Onset  . Diabetes Mother   . Heart disease Mother   . Heart disease Father   . Heart disease Brother    History   Social History  . Marital Status: Widowed    Spouse Name: N/A  . Number of Children: N/A  . Years of Education: N/A   Social History Main Topics  . Smoking status: Former Smoker -- 0.50 packs/day for 5 years    Types: Cigarettes    Quit date: 03/15/1968  . Smokeless tobacco: Not on file  . Alcohol Use: Yes     Comment: occasional glass of wine  . Drug Use: No  . Sexual Activity: Not on file   Other Topics Concern  . None   Social History Narrative   Past Surgical History  Procedure Laterality Date  . Retinal detachment surgery  9/13   Past Medical History  Diagnosis Date  . Cervical facet syndrome   .  Enthesopathy of hip region   . Synovitis and tenosynovitis   . Disorders of sacrum   . Sciatic neuritis   . Depression    BP 153/88 mmHg  Pulse 56  Resp 16  SpO2 100%  Opioid Risk Score:   Fall Risk Score: Moderate Fall Risk (6-13 points) (previously educated and given handout)`1  Depression screen PHQ 2/9  Depression screen PHQ 2/9 05/12/2014  Decreased Interest 3  Down, Depressed, Hopeless 0  PHQ - 2 Score 3  Altered sleeping 0  Tired, decreased energy 3  Change in appetite 3  Feeling bad or failure about yourself  0  Trouble concentrating 0  Moving slowly or fidgety/restless 0  Suicidal thoughts 0  PHQ-9 Score 9     Review of Systems  Musculoskeletal: Positive for back pain.  All other systems reviewed and are negative.      Objective:   Physical Exam  Constitutional: She is oriented to person, place, and time. She appears well-developed and well-nourished.  HENT:  Head: Normocephalic and atraumatic.  Neck: Normal range of motion. Neck supple.  Cardiovascular: Normal rate and regular rhythm.   Pulmonary/Chest: Effort normal and breath sounds normal.  Musculoskeletal:  Normal Muscle Bulk and Muscle  Testing Reveals: Upper Extremities: Full ROM and Muscle Strength 5/5 Sacral Tenderness ( Mainly Right Side) Lower Extremities: Full ROM and Muscle Strength 5/5 Arises from chair with ease Narrow Based Gait  Neurological: She is alert and oriented to person, place, and time.  Skin: Skin is warm and dry.  Psychiatric: She has a normal mood and affect.  Nursing note and vitals reviewed.         Assessment & Plan:  1. Post laminectomy syndrome with right buttock pain: Pain controlled on Fentanyl and Percocet.  Refilled: Fentanyl patch 75 mcg # 10- one patch every 3 days and Percocet 10/325 mg # 100--use one every 6 hours as needed.   15 minutes of face to face patient care time was spent during this visit. All questions were encouraged and answered.   F/U  in 1 month

## 2014-08-03 LAB — PMP ALCOHOL METABOLITE (ETG): Ethyl Glucuronide (EtG): NEGATIVE ng/mL

## 2014-08-07 LAB — OPIATES/OPIOIDS (LC/MS-MS)
CODEINE URINE: NEGATIVE ng/mL (ref <50)
HYDROCODONE: NEGATIVE ng/mL (ref <50)
Hydromorphone: NEGATIVE ng/mL (ref <50)
Morphine Urine: NEGATIVE ng/mL (ref <50)
Norhydrocodone, Ur: NEGATIVE ng/mL (ref <50)
Noroxycodone, Ur: 9002 ng/mL (ref <50)
Oxycodone, ur: 2573 ng/mL (ref <50)
Oxymorphone: 5314 ng/mL (ref <50)

## 2014-08-07 LAB — BENZODIAZEPINES (GC/LC/MS), URINE
ALPRAZOLAMU: NEGATIVE ng/mL (ref <25)
Clonazepam metabolite (GC/LC/MS), ur confirm: NEGATIVE ng/mL (ref <25)
Flurazepam metabolite (GC/LC/MS), ur confirm: NEGATIVE ng/mL (ref <50)
Lorazepam (GC/LC/MS), ur confirm: NEGATIVE ng/mL (ref <50)
MIDAZOLAMU: NEGATIVE ng/mL (ref <50)
Nordiazepam (GC/LC/MS), ur confirm: NEGATIVE ng/mL (ref <50)
OXAZEPAMU: NEGATIVE ng/mL (ref <50)
Temazepam (GC/LC/MS), ur confirm: NEGATIVE ng/mL (ref <50)
Triazolam metabolite (GC/LC/MS), ur confirm: NEGATIVE ng/mL (ref <50)

## 2014-08-07 LAB — OXYCODONE, URINE (LC/MS-MS)
Noroxycodone, Ur: 9002 ng/mL (ref <50)
OXYMORPHONE, URINE: 5314 ng/mL (ref <50)
Oxycodone, ur: 2573 ng/mL (ref <50)

## 2014-08-07 LAB — FENTANYL (GC/LC/MS), URINE
FENTANYL (GC/MS) CONFIRM: 19.6 ng/mL (ref <0.5)
Norfentanyl, confirm: 469.7 ng/mL (ref <0.5)

## 2014-08-08 LAB — PRESCRIPTION MONITORING PROFILE (SOLSTAS)
Amphetamine/Meth: NEGATIVE ng/mL
BARBITURATE SCREEN, URINE: NEGATIVE ng/mL
BUPRENORPHINE, URINE: NEGATIVE ng/mL
CANNABINOID SCRN UR: NEGATIVE ng/mL
COCAINE METABOLITES: NEGATIVE ng/mL
CREATININE, URINE: 137.78 mg/dL (ref >20.0)
Carisoprodol, Urine: NEGATIVE ng/mL
MDMA URINE: NEGATIVE ng/mL
Meperidine, Ur: NEGATIVE ng/mL
Methadone Screen, Urine: NEGATIVE ng/mL
Nitrites, Initial: NEGATIVE ug/mL
PROPOXYPHENE: NEGATIVE ng/mL
Tapentadol, urine: NEGATIVE ng/mL
Tramadol Scrn, Ur: NEGATIVE ng/mL
ZOLPIDEM, URINE: NEGATIVE ng/mL
pH, Initial: 6.2 pH (ref 4.5–8.9)

## 2014-08-09 NOTE — Progress Notes (Signed)
Urine drug screen for this encounter is consistent for prescribed medication 

## 2014-08-10 ENCOUNTER — Other Ambulatory Visit: Payer: Self-pay | Admitting: Physical Medicine & Rehabilitation

## 2014-08-15 ENCOUNTER — Ambulatory Visit: Payer: Medicare Other | Admitting: Physical Medicine & Rehabilitation

## 2014-08-21 ENCOUNTER — Other Ambulatory Visit: Payer: Self-pay

## 2014-09-09 ENCOUNTER — Other Ambulatory Visit: Payer: Self-pay | Admitting: Registered Nurse

## 2014-09-13 ENCOUNTER — Encounter: Payer: Self-pay | Admitting: Registered Nurse

## 2014-09-13 ENCOUNTER — Encounter: Payer: Medicare Other | Attending: Physical Medicine and Rehabilitation | Admitting: Registered Nurse

## 2014-09-13 VITALS — BP 112/72 | HR 89 | Resp 16

## 2014-09-13 DIAGNOSIS — M47816 Spondylosis without myelopathy or radiculopathy, lumbar region: Secondary | ICD-10-CM | POA: Diagnosis not present

## 2014-09-13 DIAGNOSIS — M2170 Unequal limb length (acquired), unspecified site: Secondary | ICD-10-CM | POA: Diagnosis present

## 2014-09-13 DIAGNOSIS — M545 Low back pain, unspecified: Secondary | ICD-10-CM

## 2014-09-13 DIAGNOSIS — Z79899 Other long term (current) drug therapy: Secondary | ICD-10-CM

## 2014-09-13 DIAGNOSIS — M533 Sacrococcygeal disorders, not elsewhere classified: Secondary | ICD-10-CM

## 2014-09-13 DIAGNOSIS — M961 Postlaminectomy syndrome, not elsewhere classified: Secondary | ICD-10-CM

## 2014-09-13 DIAGNOSIS — G8929 Other chronic pain: Secondary | ICD-10-CM

## 2014-09-13 DIAGNOSIS — Z5181 Encounter for therapeutic drug level monitoring: Secondary | ICD-10-CM

## 2014-09-13 MED ORDER — OXYCODONE-ACETAMINOPHEN 10-325 MG PO TABS: 1.0000 | ORAL_TABLET | Freq: Four times a day (QID) | ORAL | Status: DC | PRN

## 2014-09-13 MED ORDER — FENTANYL 75 MCG/HR TD PT72: 75.0000 ug | MEDICATED_PATCH | TRANSDERMAL | Status: DC

## 2014-09-13 NOTE — Progress Notes (Signed)
Subjective:    Patient ID: Alexandria Mclaughlin, female    DOB: [birth year], 74 y.o.   MRN: [id number]  HPI: Alexandria Mclaughlin is a 74 year old female who returns for follow up for chronic pain and medication refill. She says her pain is located in her lower back and right buttock ( sacral ). She rates her pain 4. Her current exercise regime is walking and swimming. She's scheduled for a physical therapy evaluation on July 20.2016.   Dr. Ernestina Patches sent a note stating he felt  she would benefit from radio frequency and he [place] Dr. Letta Pate had performed previously Dr. Naaman Plummer agree's. Ms. Maeda doesn't want to wait till Dr. Letta Pate next availability she will scheduled with Dr. Louanne Skye.   Pain Inventory Average Pain 4 Pain Right Now 4 My pain is intermittent, sharp and stabbing  In the last 24 hours, has pain interfered with the following? General activity 4 Relation with others 5 Enjoyment of life 5 What TIME of day is your pain at its worst? daytime Sleep (in general) Good  Pain is worse with: sitting and standing Pain improves with: rest, medication and injections Relief from Meds: 7  Mobility Do you have any goals in this area?  no  Function Do you have any goals in this area?  no  Neuro/Psych No problems in this area  Prior Studies Any changes since last visit?  no  Physicians involved in your care Any changes since last visit?  no   Family History  Problem Relation Age of Onset  . Diabetes Mother   . Heart disease Mother   . Heart disease Father   . Heart disease Brother    History   Social History  . Marital Status: Widowed    Spouse Name: N/A  . Number of Children: N/A  . Years of Education: N/A   Social History Main Topics  . Smoking status: Former Smoker -- 0.50 packs/day for 5 years    Types: Cigarettes    Quit date: 03/15/1968  . Smokeless tobacco: Not on file  . Alcohol Use: Yes     Comment: occasional glass of wine  . Drug Use: No  . Sexual Activity:  Not on file   Other Topics Concern  . None   Social History Narrative   Past Surgical History  Procedure Laterality Date  . Retinal detachment surgery  9/13   Past Medical History  Diagnosis Date  . Cervical facet syndrome   . Enthesopathy of hip region   . Synovitis and tenosynovitis   . Disorders of sacrum   . Sciatic neuritis   . Depression    BP 112/72 mmHg  Pulse 89  Resp 16  SpO2 95%  Opioid Risk Score:   Fall Risk Score:  `1  Depression screen PHQ 2/9  Depression screen [facility] 2/9 09/13/2014 05/12/2014  Decreased Interest 3 3  Down, Depressed, Hopeless 0 0  PHQ - 2 Score 3 3  Altered sleeping - 0  Tired, decreased energy - 3  Change in appetite - 3  Feeling bad or failure about yourself  - 0  Trouble concentrating - 0  Moving slowly or fidgety/restless - 0  Suicidal thoughts - 0  PHQ-9 Score - 9     Review of Systems  Gastrointestinal: Positive for constipation.  All other systems reviewed and are negative.      Objective:   Physical Exam  Constitutional: She is oriented to person, place, and time. She  appears well-developed and well-nourished.  HENT:  Head: Normocephalic and atraumatic.  Neck: Normal range of motion. Neck supple.  Cardiovascular: Normal rate and regular rhythm.   Pulmonary/Chest: Effort normal and breath sounds normal.  Musculoskeletal:  Normal Muscle Bulk and Muscle Testing Reveals: Upper Extremities: Full ROM and Muscle Strength 5/5 Back without spinal or paraspinal tenderness Right Sacral Tenderness Lower Extremities: Full ROM and Muscle Strength 5/5 Arises from chair with ease Narrow Based gait  Neurological: She is alert and oriented to person, place, and time.  Skin: Skin is warm and dry.  Psychiatric: She has a normal mood and affect.  Vitals reviewed.         Assessment & Plan:  1. Post laminectomy syndrome with right buttock pain: Pain controlled on Fentanyl and Percocet.  Refilled: Fentanyl patch 75 mcg #  10- one patch every 3 days and Percocet 10/325 mg # 100--use one every 6 hours as needed.   15 minutes of face to face patient care time was spent during this visit. All questions were encouraged and answered.   F/U in 1 month

## 2014-09-15 ENCOUNTER — Telehealth: Payer: Self-pay | Admitting: Physical Medicine & Rehabilitation

## 2014-09-15 NOTE — Telephone Encounter (Signed)
SPOKE TO PATIENT PT WILL CALL TO SET UP HER APPT WITH OTHER PHYSICIAN AND KEEP APPT WITH Alexandria Mclaughlin

## 2014-10-06 ENCOUNTER — Other Ambulatory Visit: Payer: Self-pay | Admitting: Physical Medicine & Rehabilitation

## 2014-10-09 MED ORDER — DULOXETINE HCL 30 MG PO CPEP: 30.0000 mg | ORAL_CAPSULE | Freq: Every day | ORAL | Status: DC

## 2014-10-09 NOTE — Addendum Note (Signed)
Addended by: Caro Hight on: 10/09/2014 04:58 PM   Modules accepted: Orders

## 2014-10-10 ENCOUNTER — Encounter: Payer: Self-pay | Admitting: Registered Nurse

## 2014-10-10 ENCOUNTER — Encounter: Payer: Medicare Other | Attending: Physical Medicine and Rehabilitation | Admitting: Registered Nurse

## 2014-10-10 VITALS — BP 139/96 | HR 66

## 2014-10-10 DIAGNOSIS — Z5181 Encounter for therapeutic drug level monitoring: Secondary | ICD-10-CM

## 2014-10-10 DIAGNOSIS — M545 Low back pain: Secondary | ICD-10-CM

## 2014-10-10 DIAGNOSIS — M2170 Unequal limb length (acquired), unspecified site: Secondary | ICD-10-CM | POA: Insufficient documentation

## 2014-10-10 DIAGNOSIS — M961 Postlaminectomy syndrome, not elsewhere classified: Secondary | ICD-10-CM

## 2014-10-10 DIAGNOSIS — Z79899 Other long term (current) drug therapy: Secondary | ICD-10-CM

## 2014-10-10 DIAGNOSIS — M533 Sacrococcygeal disorders, not elsewhere classified: Secondary | ICD-10-CM | POA: Insufficient documentation

## 2014-10-10 DIAGNOSIS — G8929 Other chronic pain: Secondary | ICD-10-CM

## 2014-10-10 MED ORDER — OXYCODONE-ACETAMINOPHEN 10-325 MG PO TABS: 1.0000 | ORAL_TABLET | Freq: Four times a day (QID) | ORAL | Status: DC | PRN

## 2014-10-10 NOTE — Progress Notes (Signed)
Subjective:    Patient ID: Alexandria Mclaughlin, female    DOB: [birth year], 74 y.o.   MRN: [id number]  HPI: Alexandria Mclaughlin is a 74 year old female who returns for follow up for chronic pain and medication refill. She says her pain is located in her  right buttock ( sacral ). She rates her pain 4. Her current exercise regime is attending physical therapy twice a week , walking and  performing stretching exercises.  Pain Inventory Average Pain 4 Pain Right Now 4 My pain is intermittent and dull  In the last 24 hours, has pain interfered with the following? General activity 3 Relation with others 4 Enjoyment of life 4 What TIME of day is your pain at its worst? daytime Sleep (in general) Good  Pain is worse with: sitting and some activites Pain improves with: heat/ice, therapy/exercise, medication and injections Relief from Meds: 7  Mobility how many minutes can you walk? 60 do you drive?  yes Do you have any goals in this area?  no  Function retired Do you have any goals in this area?  no  Neuro/Psych dizziness  Prior Studies Any changes since last visit?  no  Physicians involved in your care Any changes since last visit?  no   Family History  Problem Relation Age of Onset  . Diabetes Mother   . Heart disease Mother   . Heart disease Father   . Heart disease Brother    Social History   Social History  . Marital Status: Widowed    Spouse Name: N/A  . Number of Children: N/A  . Years of Education: N/A   Social History Main Topics  . Smoking status: Former Smoker -- 0.50 packs/day for 5 years    Types: Cigarettes    Quit date: 03/15/1968  . Smokeless tobacco: None  . Alcohol Use: Yes     Comment: occasional glass of wine  . Drug Use: No  . Sexual Activity: Not Asked   Other Topics Concern  . None   Social History Narrative   Past Surgical History  Procedure Laterality Date  . Retinal detachment surgery  9/13   Past Medical History  Diagnosis Date  .  Cervical facet syndrome   . Enthesopathy of hip region   . Synovitis and tenosynovitis   . Disorders of sacrum   . Sciatic neuritis   . Depression    BP 139/96 mmHg  Pulse 66  SpO2 96%  Opioid Risk Score:   Fall Risk Score:  `1  Depression screen PHQ 2/9  Depression screen [facility] 2/9 09/13/2014 05/12/2014  Decreased Interest 3 3  Down, Depressed, Hopeless 0 0  PHQ - 2 Score 3 3  Altered sleeping - 0  Tired, decreased energy - 3  Change in appetite - 3  Feeling bad or failure about yourself  - 0  Trouble concentrating - 0  Moving slowly or fidgety/restless - 0  Suicidal thoughts - 0  PHQ-9 Score - 9      Review of Systems  Gastrointestinal: Positive for constipation.  Neurological: Positive for dizziness.  All other systems reviewed and are negative.      Objective:   Physical Exam  Constitutional: She is oriented to person, place, and time. She appears well-developed and well-nourished.  HENT:  Head: Normocephalic and atraumatic.  Neck: Normal range of motion. Neck supple.  Cardiovascular: Normal rate and regular rhythm.   Pulmonary/Chest: Effort normal and breath sounds normal.  Musculoskeletal:  Normal Muscle Bulk and Muscle Testing Reveals: Upper Extremities: Full ROM and Muscle Strength 5/5 Right Sacral Tenderness : S1 Lower Extremities: Full ROM and Muscle Strength 5/5 Arises from chair with ease Narrow Based Gait  Neurological: She is alert and oriented to person, place, and time.  Skin: Skin is warm and dry.  Psychiatric: She has a normal mood and affect.  Nursing note and vitals reviewed.          Assessment & Plan:  1. Post laminectomy syndrome with right buttock pain: Pain controlled on Fentanyl and Percocet.  Refilled:  No script given Fentanyl patch 75 mcg # 10- one patch every 3 days and Percocet 10/325 mg # 100--use one every 6 hours as needed.   15 minutes of face to face patient care time was spent during this visit. All questions were  encouraged and answered.   F/U in 1 month

## 2014-10-11 ENCOUNTER — Encounter: Payer: Medicare Other | Admitting: Registered Nurse

## 2014-10-13 ENCOUNTER — Other Ambulatory Visit: Payer: Self-pay | Admitting: Registered Nurse

## 2014-11-02 ENCOUNTER — Other Ambulatory Visit: Payer: Self-pay | Admitting: Internal Medicine

## 2014-11-02 DIAGNOSIS — R911 Solitary pulmonary nodule: Secondary | ICD-10-CM

## 2014-11-07 ENCOUNTER — Encounter: Payer: Medicare Other | Admitting: Registered Nurse

## 2014-11-08 ENCOUNTER — Encounter: Payer: Medicare Other | Attending: Physical Medicine and Rehabilitation | Admitting: Registered Nurse

## 2014-11-08 ENCOUNTER — Inpatient Hospital Stay: Admission: RE | Admit: 2014-11-08 | Payer: Medicare Other | Source: Ambulatory Visit

## 2014-11-08 DIAGNOSIS — M2170 Unequal limb length (acquired), unspecified site: Secondary | ICD-10-CM | POA: Insufficient documentation

## 2014-11-08 DIAGNOSIS — M533 Sacrococcygeal disorders, not elsewhere classified: Secondary | ICD-10-CM | POA: Insufficient documentation

## 2014-11-09 ENCOUNTER — Encounter (HOSPITAL_BASED_OUTPATIENT_CLINIC_OR_DEPARTMENT_OTHER): Payer: Medicare Other | Admitting: Registered Nurse

## 2014-11-09 ENCOUNTER — Encounter: Payer: Self-pay | Admitting: Registered Nurse

## 2014-11-09 VITALS — BP 164/85 | HR 66

## 2014-11-09 DIAGNOSIS — M545 Low back pain, unspecified: Secondary | ICD-10-CM

## 2014-11-09 DIAGNOSIS — M47816 Spondylosis without myelopathy or radiculopathy, lumbar region: Secondary | ICD-10-CM

## 2014-11-09 DIAGNOSIS — Z79899 Other long term (current) drug therapy: Secondary | ICD-10-CM

## 2014-11-09 DIAGNOSIS — G8929 Other chronic pain: Secondary | ICD-10-CM

## 2014-11-09 DIAGNOSIS — M961 Postlaminectomy syndrome, not elsewhere classified: Secondary | ICD-10-CM

## 2014-11-09 DIAGNOSIS — M533 Sacrococcygeal disorders, not elsewhere classified: Secondary | ICD-10-CM | POA: Diagnosis present

## 2014-11-09 DIAGNOSIS — M2170 Unequal limb length (acquired), unspecified site: Secondary | ICD-10-CM | POA: Diagnosis present

## 2014-11-09 DIAGNOSIS — Z5181 Encounter for therapeutic drug level monitoring: Secondary | ICD-10-CM | POA: Diagnosis not present

## 2014-11-09 MED ORDER — OXYCODONE-ACETAMINOPHEN 10-325 MG PO TABS: 1.0000 | ORAL_TABLET | Freq: Four times a day (QID) | ORAL | Status: DC | PRN

## 2014-11-09 MED ORDER — FENTANYL 75 MCG/HR TD PT72: 75.0000 ug | MEDICATED_PATCH | TRANSDERMAL | Status: DC

## 2014-11-09 NOTE — Progress Notes (Signed)
Subjective:    Patient ID: Alexandria Mclaughlin, female    DOB: August 14, 1940, 74 y.o.   MRN: [id number]  HPI: [name] is a 74 year old female who returns for follow up for chronic pain and medication refill. She says her pain is located in her right buttock ( sacral ). She rates her pain 3. Her current exercise regime is attending physical therapy twice a week , walking and performing stretching exercises. Arrived hypertensive blood pressure re-checked 152/92 she states she is compliant with her medications. Encouraged to keep blood pressure log and follow up with her PCP she verbalizes understanding. She will be going on a [place] for 13 days next month.   Pain Inventory Average Pain 4 Pain Right Now 3 My pain is intermittent, sharp and stabbing  In the last 24 hours, has pain interfered with the following? General activity 3 Relation with others 3 Enjoyment of life 3 What TIME of day is your pain at its worst? daytime Sleep (in general) Good  Pain is worse with: walking, sitting and standing Pain improves with: rest, heat/ice, therapy/exercise and medication Relief from Meds: 6  Mobility Do you have any goals in this area?  no  Function Do you have any goals in this area?  no  Neuro/Psych weakness dizziness  Prior Studies Any changes since last visit?  no  Physicians involved in your care Any changes since last visit?  no   Family History  Problem Relation Age of Onset  . Diabetes Mother   . Heart disease Mother   . Heart disease Father   . Heart disease Brother    Social History   Social History  . Marital Status: Widowed    Spouse Name: N/A  . Number of Children: N/A  . Years of Education: N/A   Social History Main Topics  . Smoking status: Former Smoker -- 0.50 packs/day for 5 years    Types: Cigarettes    Quit date: 03/15/1968  . Smokeless tobacco: Not on file  . Alcohol Use: Yes     Comment: occasional glass of wine  . Drug Use: No    . Sexual Activity: Not on file   Other Topics Concern  . Not on file   Social History Narrative   Past Surgical History  Procedure Laterality Date  . Retinal detachment surgery  9/13   Past Medical History  Diagnosis Date  . Cervical facet syndrome   . Enthesopathy of hip region   . Synovitis and tenosynovitis   . Disorders of sacrum   . Sciatic neuritis   . Depression    BP 157/91 mmHg  Pulse 113  SpO2 96%  Opioid Risk Score:   Fall Risk Score:  `1  Depression screen PHQ 2/9  Depression screen [facility] 2/9 11/09/2014 09/13/2014 05/12/2014  Decreased Interest 0 3 3  Down, Depressed, Hopeless 0 0 0  PHQ - 2 Score 0 3 3  Altered sleeping - - 0  Tired, decreased energy - - 3  Change in appetite - - 3  Feeling bad or failure about yourself  - - 0  Trouble concentrating - - 0  Moving slowly or fidgety/restless - - 0  Suicidal thoughts - - 0  PHQ-9 Score - - 9     Review of Systems  Gastrointestinal: Positive for nausea and constipation.       Objective:   Physical Exam  Constitutional: She is oriented to person, place, and time. She  appears well-developed and well-nourished.  HENT:  Head: Normocephalic and atraumatic.  Neck: Normal range of motion. Neck supple.  Cardiovascular: Normal rate and regular rhythm.   Pulmonary/Chest: Effort normal and breath sounds normal.  Musculoskeletal:  Normal Muscle Bulk and Muscle Testing Reveals: Upper Extremities: Full ROM and Muscle Strength 5/5 Lumbar Paraspinal Tenderness: L-4 Mainly Right Side Sacral Tenderness: S1 Lower Extremities: Full ROM and Muscle Strength 5/5 Arises from chair with ease Narrow Based Gait  Neurological: She is alert and oriented to person, place, and time.  Skin: Skin is warm and dry.  Psychiatric: She has a normal mood and affect.  Nursing note and vitals reviewed.         Assessment & Plan:  1. Post laminectomy syndrome with right buttock pain: Pain controlled on Fentanyl and Percocet.   Refilled: No script given Fentanyl patch 75 mcg # 10- one patch every 3 days and Percocet 10/325 mg # 100--use one every 6 hours as needed.   15 minutes of face to face patient care time was spent during this visit. All questions were encouraged and answered.   F/U in 1 month

## 2014-11-10 ENCOUNTER — Ambulatory Visit
Admission: RE | Admit: 2014-11-10 | Discharge: 2014-11-10 | Disposition: A | Discharge status: Home / Self Care | Payer: Medicare Other | Source: Ambulatory Visit | Attending: Internal Medicine | Admitting: Internal Medicine

## 2014-11-10 DIAGNOSIS — R911 Solitary pulmonary nodule: Secondary | ICD-10-CM

## 2014-11-10 MED ORDER — IOPAMIDOL (ISOVUE-300) INJECTION 61%
75.0000 mL | Freq: Once | INTRAVENOUS | Status: AC | PRN
  Administered 2014-11-10: 75 mL via INTRAVENOUS

## 2014-12-14 ENCOUNTER — Encounter: Payer: Self-pay | Admitting: Registered Nurse

## 2014-12-14 ENCOUNTER — Other Ambulatory Visit: Payer: Self-pay | Admitting: Registered Nurse

## 2014-12-14 ENCOUNTER — Encounter: Payer: Medicare Other | Attending: Physical Medicine and Rehabilitation | Admitting: Registered Nurse

## 2014-12-14 DIAGNOSIS — M533 Sacrococcygeal disorders, not elsewhere classified: Secondary | ICD-10-CM | POA: Diagnosis not present

## 2014-12-14 DIAGNOSIS — M545 Low back pain, unspecified: Secondary | ICD-10-CM

## 2014-12-14 DIAGNOSIS — Z5181 Encounter for therapeutic drug level monitoring: Secondary | ICD-10-CM

## 2014-12-14 DIAGNOSIS — G894 Chronic pain syndrome: Secondary | ICD-10-CM

## 2014-12-14 DIAGNOSIS — M2170 Unequal limb length (acquired), unspecified site: Secondary | ICD-10-CM | POA: Diagnosis present

## 2014-12-14 DIAGNOSIS — Z79899 Other long term (current) drug therapy: Secondary | ICD-10-CM

## 2014-12-14 DIAGNOSIS — M47816 Spondylosis without myelopathy or radiculopathy, lumbar region: Secondary | ICD-10-CM

## 2014-12-14 DIAGNOSIS — G8929 Other chronic pain: Secondary | ICD-10-CM

## 2014-12-14 DIAGNOSIS — M961 Postlaminectomy syndrome, not elsewhere classified: Secondary | ICD-10-CM

## 2014-12-14 MED ORDER — FENTANYL 75 MCG/HR TD PT72: 75.0000 ug | MEDICATED_PATCH | TRANSDERMAL | Status: DC

## 2014-12-14 MED ORDER — OXYCODONE-ACETAMINOPHEN 10-325 MG PO TABS: 1.0000 | ORAL_TABLET | Freq: Four times a day (QID) | ORAL | Status: DC | PRN

## 2014-12-14 NOTE — Progress Notes (Signed)
Subjective:    Patient ID: Alexandria Mclaughlin, female    DOB: 05-25-1940, 74 y.o.   MRN: [id number]  HPI: Alexandria Mclaughlin is a 74 year old female who returns for follow up for chronic pain and medication refill. She states she is pain free at the moment her usual pain is located in her right buttock ( sacral ). She rates her pain 3 last night. Her current exercise regime is walking and performing stretching exercises. She return from her [place] when she grabbed her oxycodone bottle from her suitcase this morning she noticed the label was off. According to [facility] her Oxycodone was filled on 11/08/2014 #100.   Pain Inventory Average Pain 3 Pain Right Now 3 My pain is intermittent, sharp and stabbing  In the last 24 hours, has pain interfered with the following? General activity 4 Relation with others 5 Enjoyment of life 4 What TIME of day is your pain at its worst? daytime Sleep (in general) Good  Pain is worse with: sitting and standing Pain improves with: rest, heat/ice, pacing activities and medication Relief from Meds: 5  Mobility Do you have any goals in this area?  no  Function Do you have any goals in this area?  no  Neuro/Psych No problems in this area  Prior Studies Any changes since last visit?  no  Physicians involved in your care Any changes since last visit?  no   Family History  Problem Relation Age of Onset  . Diabetes Mother   . Heart disease Mother   . Heart disease Father   . Heart disease Brother    Social History   Social History  . Marital Status: Widowed    Spouse Name: N/A  . Number of Children: N/A  . Years of Education: N/A   Social History Main Topics  . Smoking status: Former Smoker -- 0.50 packs/day for 5 years    Types: Cigarettes    Quit date: 03/15/1968  . Smokeless tobacco: Not on file  . Alcohol Use: Yes     Comment: occasional glass of wine  . Drug Use: No  . Sexual Activity: Not on file   Other Topics Concern    . Not on file   Social History Narrative   Past Surgical History  Procedure Laterality Date  . Retinal detachment surgery  9/13   Past Medical History  Diagnosis Date  . Cervical facet syndrome   . Enthesopathy of hip region   . Synovitis and tenosynovitis   . Disorders of sacrum   . Sciatic neuritis   . Depression    There were no vitals taken for this visit.  Opioid Risk Score:   Fall Risk Score:  `1  Depression screen PHQ 2/9  Depression screen [facility] 2/9 11/09/2014 09/13/2014 05/12/2014  Decreased Interest 0 3 3  Down, Depressed, Hopeless 0 0 0  PHQ - 2 Score 0 3 3  Altered sleeping - - 0  Tired, decreased energy - - 3  Change in appetite - - 3  Feeling bad or failure about yourself  - - 0  Trouble concentrating - - 0  Moving slowly or fidgety/restless - - 0  Suicidal thoughts - - 0  PHQ-9 Score - - 9     Review of Systems  All other systems reviewed and are negative.      Objective:   Physical Exam  Constitutional: She is oriented to person, place, and time. She appears well-developed and well-nourished.  HENT:  Head: Normocephalic and atraumatic.  Neck: Normal range of motion. Neck supple.  Cardiovascular: Normal rate and regular rhythm.   Pulmonary/Chest: Effort normal and breath sounds normal.  Musculoskeletal:  Normal Muscle Bulk and Muscle Testing Reveals: Upper Extremities: Full ROM and Muscle Strength 5/5 Back without spinal or paraspinal tenderness Lower Extremities: Full ROM and Muscle Strength 5/5 Arises from chair with ease Narrow Based gait   Neurological: She is alert and oriented to person, place, and time.  Skin: Skin is warm and dry.  Psychiatric: She has a normal mood and affect.  Nursing note and vitals reviewed.         Assessment & Plan:  1. Post laminectomy syndrome with right buttock pain: Pain controlled on Fentanyl and Percocet.  Refilled: Fentanyl patch 75 mcg # 10- one patch every 3 days and Percocet 10/325 mg #  100--use one every 6 hours as needed.   15 minutes of face to face patient care time was spent during this visit. All questions were encouraged and answered.   F/U in 1 month

## 2014-12-15 LAB — PMP ALCOHOL METABOLITE (ETG): Ethyl Glucuronide (EtG): NEGATIVE ng/mL

## 2014-12-19 LAB — OXYCODONE, URINE (LC/MS-MS)
NOROXYCODONE, UR: 7273 ng/mL (ref <50)
OXYCODONE, UR: 1175 ng/mL (ref <50)
OXYMORPHONE, URINE: 3572 ng/mL (ref <50)

## 2014-12-19 LAB — FENTANYL (GC/LC/MS), URINE
FENTANYL (GC/MS) CONFIRM: 167.4 ng/mL (ref <0.5)
NORFENTANYL (GC/MS) CONFIRM: 1055.8 ng/mL (ref <0.5)

## 2014-12-19 LAB — OPIATES/OPIOIDS (LC/MS-MS)
CODEINE URINE: NEGATIVE ng/mL (ref <50)
Hydrocodone: NEGATIVE ng/mL (ref <50)
Hydromorphone: NEGATIVE ng/mL (ref <50)
Morphine Urine: NEGATIVE ng/mL (ref <50)
Norhydrocodone, Ur: NEGATIVE ng/mL (ref <50)
Noroxycodone, Ur: 7273 ng/mL (ref <50)
OXYMORPHONE, URINE: 3572 ng/mL (ref <50)
Oxycodone, ur: 1175 ng/mL (ref <50)

## 2014-12-20 LAB — PRESCRIPTION MONITORING PROFILE (SOLSTAS)
Amphetamine/Meth: NEGATIVE ng/mL
BENZODIAZEPINE SCREEN, URINE: NEGATIVE ng/mL
BUPRENORPHINE, URINE: NEGATIVE ng/mL
Barbiturate Screen, Urine: NEGATIVE ng/mL
COCAINE METABOLITES: NEGATIVE ng/mL
CREATININE, URINE: 174.72 mg/dL (ref >20.0)
Cannabinoid Scrn, Ur: NEGATIVE ng/mL
Carisoprodol, Urine: NEGATIVE ng/mL
ECSTASY: NEGATIVE ng/mL
MEPERIDINE UR: NEGATIVE ng/mL
Methadone Screen, Urine: NEGATIVE ng/mL
Nitrites, Initial: NEGATIVE ug/mL
PH URINE, INITIAL: 5.2 pH (ref 4.5–8.9)
Propoxyphene: NEGATIVE ng/mL
TRAMADOL UR: NEGATIVE ng/mL
Tapentadol, urine: NEGATIVE ng/mL
ZOLPIDEM, URINE: NEGATIVE ng/mL

## 2015-01-04 NOTE — Progress Notes (Signed)
Urine drug screen for this encounter is consistent for prescribed medication 

## 2015-01-09 ENCOUNTER — Other Ambulatory Visit: Payer: Self-pay | Admitting: Physical Medicine & Rehabilitation

## 2015-01-09 ENCOUNTER — Other Ambulatory Visit: Payer: Self-pay | Admitting: Registered Nurse

## 2015-01-10 ENCOUNTER — Other Ambulatory Visit: Payer: Self-pay | Admitting: Physical Medicine & Rehabilitation

## 2015-01-10 ENCOUNTER — Encounter: Payer: Self-pay | Admitting: Physical Medicine & Rehabilitation

## 2015-01-10 ENCOUNTER — Other Ambulatory Visit: Payer: Self-pay | Admitting: Registered Nurse

## 2015-01-10 ENCOUNTER — Encounter: Payer: Medicare Other | Attending: Physical Medicine and Rehabilitation | Admitting: Physical Medicine & Rehabilitation

## 2015-01-10 VITALS — BP 131/80 | HR 71

## 2015-01-10 DIAGNOSIS — R2689 Other abnormalities of gait and mobility: Secondary | ICD-10-CM

## 2015-01-10 DIAGNOSIS — M2170 Unequal limb length (acquired), unspecified site: Secondary | ICD-10-CM | POA: Diagnosis present

## 2015-01-10 DIAGNOSIS — M961 Postlaminectomy syndrome, not elsewhere classified: Secondary | ICD-10-CM

## 2015-01-10 DIAGNOSIS — M533 Sacrococcygeal disorders, not elsewhere classified: Secondary | ICD-10-CM | POA: Insufficient documentation

## 2015-01-10 DIAGNOSIS — R269 Unspecified abnormalities of gait and mobility: Secondary | ICD-10-CM

## 2015-01-10 DIAGNOSIS — R29818 Other symptoms and signs involving the nervous system: Secondary | ICD-10-CM

## 2015-01-10 DIAGNOSIS — M47816 Spondylosis without myelopathy or radiculopathy, lumbar region: Secondary | ICD-10-CM | POA: Diagnosis not present

## 2015-01-10 DIAGNOSIS — R270 Ataxia, unspecified: Secondary | ICD-10-CM

## 2015-01-10 MED ORDER — OXYCODONE-ACETAMINOPHEN 10-325 MG PO TABS: 1.0000 | ORAL_TABLET | Freq: Four times a day (QID) | ORAL | Status: DC | PRN

## 2015-01-10 MED ORDER — FENTANYL 50 MCG/HR TD PT72: 50.0000 ug | MEDICATED_PATCH | TRANSDERMAL | Status: DC

## 2015-01-10 NOTE — Patient Instructions (Addendum)
IF YOU HAVE ANY WORSENING BALANCE, COORDINATION, NAUSEA, VOMITING, DIZZINESS  ---GO TO THE ED!!!   PLEASE CALL ME WITH ANY PROBLEMS OR QUESTIONS [id number]). HAVE A HAPPY HOLIDAY SEASON!!!

## 2015-01-10 NOTE — Progress Notes (Signed)
Subjective:    Patient ID: Alexandria Mclaughlin, female    DOB: 1940/09/13, 74 y.o.   MRN: [id number]  HPI   Alexandria Mclaughlin is here in follow up of her chronic pain. She is doing well with physical therapy and dry needling that theyre doing. Her pain levels are quite a bit down. She is doing well enough that she wants to decrease her pain patch.   On another note, she has been noticing problems with her balance and coordination over the last 3 months. She just came back from a trip to the [place] and friends noticed that her balance was off too. Her balance hasn't worsened, but it hasn't improved either. Additionally, she has experienced dizziness and generallized weakness. She denies sensory loss or swallowing issues. She has had occasional double vision.  Pain Inventory Average Pain 3 Pain Right Now 3 My pain is intermittent, sharp and stabbing  In the last 24 hours, has pain interfered with the following? General activity 3 Relation with others 3 Enjoyment of life 3 What TIME of day is your pain at its worst? daytime Sleep (in general) Fair  Pain is worse with: sitting and standing Pain improves with: heat/ice, therapy/exercise and medication Relief from Meds: no selection  Mobility walk without assistance Do you have any goals in this area?  no  Function Do you have any goals in this area?  no  Neuro/Psych No problems in this area  Prior Studies Any changes since last visit?  no  Physicians involved in your care Any changes since last visit?  no   Family History  Problem Relation Age of Onset  . Diabetes Mother   . Heart disease Mother   . Heart disease Father   . Heart disease Brother    Social History   Social History  . Marital Status: Widowed    Spouse Name: N/A  . Number of Children: N/A  . Years of Education: N/A   Social History Main Topics  . Smoking status: Former Smoker -- 0.50 packs/day for 5 years    Types: Cigarettes    Quit date: 03/15/1968  .  Smokeless tobacco: None  . Alcohol Use: Yes     Comment: occasional glass of wine  . Drug Use: No  . Sexual Activity: Not Asked   Other Topics Concern  . None   Social History Narrative   Past Surgical History  Procedure Laterality Date  . Retinal detachment surgery  9/13   Past Medical History  Diagnosis Date  . Cervical facet syndrome   . Enthesopathy of hip region   . Synovitis and tenosynovitis   . Disorders of sacrum   . Sciatic neuritis   . Depression    BP 131/80 mmHg  Pulse 71  SpO2 97%  Opioid Risk Score:   Fall Risk Score:  `1  Depression screen PHQ 2/9  Depression screen [facility] 2/9 11/09/2014 09/13/2014 05/12/2014  Decreased Interest 0 3 3  Down, Depressed, Hopeless 0 0 0  PHQ - 2 Score 0 3 3  Altered sleeping - - 0  Tired, decreased energy - - 3  Change in appetite - - 3  Feeling bad or failure about yourself  - - 0  Trouble concentrating - - 0  Moving slowly or fidgety/restless - - 0  Suicidal thoughts - - 0  PHQ-9 Score - - 9      Review of Systems  Musculoskeletal: Positive for back pain.  All other systems reviewed and are  negative.      Objective:   Physical Exam  Review of Systems  HENT: Negative.  Eyes: Negative.  Respiratory: Negative.  Cardiovascular: Negative.  Gastrointestinal: Negative.  Endocrine: Negative.  Genitourinary: Negative.  Musculoskeletal: Positive for myalgias, back pain and arthralgias.  Bilateral leg pain - left is worse  Skin: Negative.  Allergic/Immunologic: Negative.  Neurological: Positive for weakness.  Weakness in left leg  Hematological: Negative.  Psychiatric/Behavioral: Negative.    Objective:   Physical Exam  Physical Exam  The patient is alert and appropriate. Pupils equal round and reactive to light. Extraocular eye movements intact. Heart is regular chest is clear and abdomen is soft and nontender. Low back is somewhat tender still around the operative site and inferiorly. She has pain with  palpation to the right PSIS area as well as the left--once again---it may be better.  Facet maneuvers are positive left more so than right today. .. Strength is generally intact at 4+ to 5 out of 5 in both legs. Reflexes appear symmetrical 2+ bilaterally. Sensory exam is normal in both legs. She does display limb ataxia of right upper and right lower exts. She loses her balance to the left when walking. She had 2-3 beats of nystagmus when scanning to the right. She lost balance when changing directions and turning around. . Cognitively she is alert and appropriate. Behaviorally she is intact. Gait is normal.  Psych: pt appears a little flat,depressed    Assessment & Plan:   ASSESSMENT:  1. Lumbar post laminectomy syndrome.  2. Left leg discrepancy.  3. New onset of back pain, now affecting left more than right. Presentation and xrays consistent with listhesis of L4 on L5 and facet syndrome 4. New left limb, truncal ataxia. Concerning for subacute left cerebellar or brainstem event.     PLAN:  1. Gabapentin for neuropathic pain.  2. Percocet 10/325 one p.o. q.6 h. p.r.n., #100  3. Decrease fentanyl at 34mcg/hr as this seems to be working well for her.. Second rx'es were given of narcotics for next month.  4. Ordered an MRI/MRA with/without contrast to assess brain/arteries. Whatever took place 3 months ago hasn't worsened apparently , but I' m concerned about her future risk for a cerebral-vascular event..  5. Her questions were encouraged and answered.  She will follow up with me in about 1 month. I will also contact her primary.

## 2015-01-15 ENCOUNTER — Telehealth: Payer: Self-pay

## 2015-01-15 DIAGNOSIS — I639 Cerebral infarction, unspecified: Secondary | ICD-10-CM

## 2015-01-15 NOTE — Telephone Encounter (Signed)
Pt called requesting a MRI to be scheduled.

## 2015-01-30 NOTE — Addendum Note (Signed)
Addended by: Geryl Rankins D on: 01/30/2015 09:21 AM   Modules accepted: Orders

## 2015-01-31 ENCOUNTER — Ambulatory Visit
Admission: RE | Admit: 2015-01-31 | Discharge: 2015-01-31 | Disposition: A | Discharge status: Home / Self Care | Payer: Medicare Other | Source: Ambulatory Visit | Attending: Physical Medicine & Rehabilitation | Admitting: Physical Medicine & Rehabilitation

## 2015-01-31 ENCOUNTER — Inpatient Hospital Stay: Admission: RE | Admit: 2015-01-31 | Payer: Medicare Other | Source: Ambulatory Visit

## 2015-01-31 DIAGNOSIS — R2689 Other abnormalities of gait and mobility: Secondary | ICD-10-CM

## 2015-01-31 DIAGNOSIS — R270 Ataxia, unspecified: Secondary | ICD-10-CM

## 2015-01-31 NOTE — Telephone Encounter (Signed)
I spoke to Dr. Leonie Man regarding her MRI findings----he will see her (or Dr. Erlinda Hong) in the office. A referral has been made.

## 2015-02-01 ENCOUNTER — Encounter: Payer: Self-pay | Admitting: Physical Medicine & Rehabilitation

## 2015-02-06 ENCOUNTER — Ambulatory Visit (INDEPENDENT_AMBULATORY_CARE_PROVIDER_SITE_OTHER): Payer: Medicare Other | Admitting: Neurology

## 2015-02-06 ENCOUNTER — Encounter: Payer: Self-pay | Admitting: Physical Medicine & Rehabilitation

## 2015-02-06 ENCOUNTER — Encounter: Payer: Self-pay | Admitting: Neurology

## 2015-02-06 VITALS — BP 157/96 | HR 83 | Ht 63.0 in | Wt 140.0 lb

## 2015-02-06 DIAGNOSIS — G9349 Other encephalopathy: Secondary | ICD-10-CM

## 2015-02-06 DIAGNOSIS — R269 Unspecified abnormalities of gait and mobility: Secondary | ICD-10-CM | POA: Diagnosis not present

## 2015-02-06 DIAGNOSIS — M545 Low back pain: Secondary | ICD-10-CM | POA: Diagnosis not present

## 2015-02-06 DIAGNOSIS — IMO0002 Reserved for concepts with insufficient information to code with codable children: Secondary | ICD-10-CM

## 2015-02-06 DIAGNOSIS — I10 Essential (primary) hypertension: Secondary | ICD-10-CM | POA: Diagnosis not present

## 2015-02-06 DIAGNOSIS — H8110 Benign paroxysmal vertigo, unspecified ear: Secondary | ICD-10-CM | POA: Diagnosis not present

## 2015-02-06 DIAGNOSIS — E785 Hyperlipidemia, unspecified: Secondary | ICD-10-CM | POA: Diagnosis not present

## 2015-02-06 NOTE — Patient Instructions (Signed)
-   start ASA 81mg  daily and continue the lovastatin for stroke prevention - Follow up with your primary care physician for stroke risk factor modification. Recommend maintain blood pressure goal around 130/80, diabetes with hemoglobin A1c goal below 6.5% and lipids with LDL cholesterol goal below 70 mg/dL.  - will do carotid doppler and echocardiogram - continue PT OT - will do sleep study to rule out sleep apnea. - follow up in 2 months

## 2015-02-06 NOTE — Telephone Encounter (Signed)
Please advise. I do not see anything in note except to follow up with PCP about stroke risk factor modification from this visit.

## 2015-02-06 NOTE — Progress Notes (Signed)
NEUROLOGY CLINIC NEW PATIENT NOTE  NAME: Alexandria Mclaughlin DOB: [birth year] REFERRING PHYSICIAN: Meredith Staggers, MD  I saw [name] as a new consult in the neurovascular clinic today regarding  Chief Complaint  Patient presents with  . Referral    from [name] pain md  .  HPI: Alexandria Mclaughlin is a 74 y.o. female with PMH of HTN, HLD, BPPV, back pain s/p surgery who presents as a new patient for Imbalance, double vision, headache, memory loss.   Patient stated that due to back pain she had wobbly walking for a while and in the going PT and pain control with rehabilitation. However, for the last 2 months, she noticed her walking getting worse, more wobbly, imbalance, veering to the left on walking. However, there is no history of falls.   She also complains one episode of binocular double vision about 2 weeks ago, lasting 5 minutes, resolved without recurrence. Denies any other symptoms associate with double vision, no facial weakness, speech difficulty, dizziness, weakness or numbness.   She also complained of headache, off and on, however lately low-grade constant headache, different from her typical migraine headache. Taking Excedrin for headache occasionally.   She also complained of memory loss for the last one year, has to write down things otherwise will forget. Sometimes difficulty come up with words. However, still able to do all her ADLs, financially independent, no flooding or burning accidents, no loss in familiar environment.   She has been following with Dr. Naaman Plummer, had MRI and MRA head showed no acute abnormality, no acute stroke, and an unremarkable MRA. However, her white matter small vessel disease seems much progressed since 3 years ago.  She had a history of a back pain, status post surgery 3, last surgery 4 years ago. She had constant back pain, undergoing PT treatment as well as pain management with Percocet. She also has history of BPPV, undergoing PT maneuver  training. History of hypertension, hyperlipidemia on quinapril and lovastatin. Denies any history of diabetes. As per daughter, she had snoring during sleep, and patient also admits she sometimes woke up with gasping for air. Never had a sleep study done before. Denies smoking, alcohol, illicit drugs.  Patient denies any recent stress, anxiety, or depression. Stated that her life is simple and stress free.  Past Medical History  Diagnosis Date  . Cervical facet syndrome   . Enthesopathy of hip region   . Synovitis and tenosynovitis   . Disorders of sacrum   . Sciatic neuritis   . Depression   . Hypertension   . Vision abnormalities   . Headache    Past Surgical History  Procedure Laterality Date  . Retinal detachment surgery  9/13   Family History  Problem Relation Age of Onset  . Diabetes Mother   . Heart disease Mother   . Stroke Mother   . Heart disease Father   . Heart disease Brother    Current Outpatient Prescriptions  Medication Sig Dispense Refill  . DULoxetine (CYMBALTA) 30 MG capsule TAKE 1 CAPSULE (30 MG TOTAL) BY MOUTH DAILY. 30 capsule 2  . fentaNYL (DURAGESIC - DOSED MCG/HR) 50 MCG/HR Place 1 patch (50 mcg total) onto the skin every 3 (three) days. 10 patch 0  . gabapentin (NEURONTIN) 300 MG capsule TAKE ONE CAPSULE BY MOUTH 3 TIMES A DAY AND AT BEDTIME AS NEEDED 120 capsule 5  . lovastatin (MEVACOR) 40 MG tablet Take 40 mg by mouth daily.    Marland Kitchen  oxyCODONE-acetaminophen (PERCOCET) 10-325 MG tablet Take 1 tablet by mouth every 6 (six) hours as needed. 100 tablet 0  . quinapril (ACCUPRIL) 20 MG tablet Take 20 mg by mouth daily.    . traZODone (DESYREL) 100 MG tablet TAKE 1 TABLET (100 MG TOTAL) BY MOUTH AT BEDTIME. 30 tablet 3   No current facility-administered medications for this visit.   No Known Allergies Social History   Social History  . Marital Status: Widowed    Spouse Name: N/A  . Number of Children: N/A  . Years of Education: N/A   Occupational  History  . Not on file.   Social History Main Topics  . Smoking status: Former Smoker -- 0.50 packs/day for 5 years    Types: Cigarettes    Quit date: 03/15/1968  . Smokeless tobacco: Not on file  . Alcohol Use: Yes     Comment: occasional glass of wine  . Drug Use: No  . Sexual Activity: Not on file   Other Topics Concern  . Not on file   Social History Narrative    Review of Systems Full 14 system review of systems performed and notable only for those listed, all others are neg:  Constitutional:   Cardiovascular:  Ear/Nose/Throat:   Skin:  Eyes:  Double vision Respiratory:   Gastroitestinal:   Genitourinary:  Hematology/Lymphatic:   Endocrine:  Musculoskeletal:   Allergy/Immunology:   Neurological:  Headache, weakness, dizziness Psychiatric:  Sleep:    Physical Exam  Filed Vitals:   02/06/15 1134  BP: 157/96  Pulse: 83    General - Well nourished, well developed, in no apparent distress.  Ophthalmologic - fundi not visualized due to eye movement.  Cardiovascular - Regular rate and rhythm.   Neck - supple, no nuchal rigidity .  Mental Status -  Level of arousal and orientation to time, place, and person were intact. Language including expression, naming, repetition, comprehension, reading, and writing was assessed and found intact. Attention span and concentration were normal. Recent and remote memory were 3/3 registration and 2/3 delayed recall. Fund of Knowledge was assessed and was intact.  Cranial Nerves II - XII - II - Visual field intact OU. III, IV, VI - Extraocular movements intact. However, patient complains of bilateral end-gaze diplopia. V - Facial sensation intact bilaterally. VII - Facial movement intact bilaterally. VIII - Hearing & vestibular intact bilaterally. X - Palate elevates symmetrically. XI - Chin turning & shoulder shrug intact bilaterally. XII - Tongue protrusion intact.  Motor Strength - The patient's strength was  symmetrical in BUEs, 4/5, with mild give-away weakness; symmetrical in BLEs, 3/5 with significant give-away weakness and pronator drift was absent.  Bulk was normal and fasciculations were absent.   Motor Tone - Muscle tone was assessed at the neck and appendages and was normal.  Reflexes - The patient's reflexes were normal in all extremities and she had no pathological reflexes.  Sensory - Light touch, temperature/pinprick were assessed and were normal.    Coordination - The patient had normal movements in the hands and feet with no ataxia or dysmetria. Initially, slow on finger to nose and heel-to-shin on the left side, however, with repeated testing, no significant slowing noticed on the left. Tremor was absent.  Gait and Station - walk without any assistive device, absence of arm swing, intermittently unsteady gait more consistent with psychogenic gait.   Imaging  I have personally reviewed the radiological images below and agree with the radiology interpretations.  06/18/11 MRI brain  No acute intracranial findings. Mild chronic microvascular ischemic change, possibly age related or secondary to hypertension.  01/31/15 MRI and MRA brain 1. No acute intracranial abnormality. 2. Progressive, moderate chronic small vessel ischemic disease. 3. Unremarkable head MRA aside from normal variant anatomy.  Lab Review January 2016 A1c 5.8, LDL 96, TSH 0.473    Assessment and Plan:   In summary, MONCIA MCCLURE is a 74 y.o. female with PMH of  presents HTN, HLD, BPPV, back pain s/p surgery who presents as a new patient for gait imbalance, double vision, headache, memory loss. Examination showed giveaway weakness and likely psychogenic gait. Her gait imbalance without falls could be related to chronic back pain however, there is a big psychogenic component. Transient double vision without other brainstem deficit difficult to localize. Headache more consistent with tension headache. And memory loss  likely due to MCI with underlying anxiety, depression. Pt so far denies any anxiety, stress or depression. However, her MRI did show progressive small vessel white matter changes. MRA, A1c, LDL so far unremarkable, will need carotid Doppler and 2-D echo to finish off stroke workup. Recommend aspirin and continue statin for stroke prevention. She has s/s of OSA, recommend sleep study.  - start ASA 81mg  daily and continue the lovastatin for stroke prevention - Follow up with PCP for stroke risk factor modification. Recommend maintain blood pressure goal around 130/80, diabetes with hemoglobin A1c goal below 6.5% and lipids with LDL cholesterol goal below 70 mg/dL.  - carotid doppler and echocardiogram - continue PT OT - sleep study to rule out sleep apnea. - follow up in 2 months  I recommend aggressive blood pressure control with a goal <130/80 mm Hg.  Lipids should be managed intensively, with a goal LDL < 70 mg/dL.  I encouraged the patient to discuss these important issues with her primary care physician.  I counseled the patient on measures to reduce stroke risk, including the importance of medication compliance, risk factor control, exercise, healthy diet, and avoidance of smoking.  I reviewed stroke warning signs and symptoms and appropriate actions to take if such occurs.   Thank you very much for the opportunity to participate in the care of this patient.  Please do not hesitate to call if any questions or concerns arise.  Orders Placed This Encounter  Procedures  . Ambulatory referral to Sleep Studies    Referral Priority:  Routine    Referral Type:  Consultation    Referral Reason:  Specialty Services Required    Number of Visits Requested:  1  . ECHOCARDIOGRAM COMPLETE    Standing Status: Future     Number of Occurrences:      Standing Expiration Date: 05/08/2016    Order Specific Question:  Where should this test be performed    Answer:  [facility])     Order Specific Question:  Complete or Limited study?    Answer:  Complete    Order Specific Question:  With Image Enhancing Agent or without Image Enhancing Agent?    Answer:  With Image Enhancing Agent    Order Specific Question:  Reason for exam-Echo    Answer:  Stroke  434.91 / I163.9    No orders of the defined types were placed in this encounter.    Patient Instructions  - start ASA 81mg  daily and continue the lovastatin for stroke prevention - Follow up with your primary care physician for stroke risk factor modification. Recommend maintain blood pressure goal  around 130/80, diabetes with hemoglobin A1c goal below 6.5% and lipids with LDL cholesterol goal below 70 mg/dL.  - will do carotid doppler and echocardiogram - continue PT OT - will do sleep study to rule out sleep apnea. - follow up in 2 months     Rosalin Hawking, MD PhD [facility] [address] [place], [place] [address] [phone]

## 2015-02-07 DIAGNOSIS — IMO0002 Reserved for concepts with insufficient information to code with codable children: Secondary | ICD-10-CM | POA: Insufficient documentation

## 2015-02-07 DIAGNOSIS — M545 Low back pain, unspecified: Secondary | ICD-10-CM

## 2015-02-07 DIAGNOSIS — I10 Essential (primary) hypertension: Secondary | ICD-10-CM

## 2015-02-07 DIAGNOSIS — H8110 Benign paroxysmal vertigo, unspecified ear: Secondary | ICD-10-CM | POA: Insufficient documentation

## 2015-02-07 DIAGNOSIS — R269 Unspecified abnormalities of gait and mobility: Secondary | ICD-10-CM

## 2015-02-07 DIAGNOSIS — I679 Cerebrovascular disease, unspecified: Secondary | ICD-10-CM | POA: Insufficient documentation

## 2015-02-07 DIAGNOSIS — E785 Hyperlipidemia, unspecified: Secondary | ICD-10-CM | POA: Insufficient documentation

## 2015-02-07 HISTORY — DX: Benign paroxysmal vertigo, unspecified ear: H81.10

## 2015-02-07 HISTORY — DX: Unspecified abnormalities of gait and mobility: R26.9

## 2015-02-07 HISTORY — DX: Essential (primary) hypertension: I10

## 2015-02-07 HISTORY — DX: Low back pain, unspecified: M54.50

## 2015-02-07 HISTORY — DX: Hyperlipidemia, unspecified: E78.5

## 2015-02-08 ENCOUNTER — Other Ambulatory Visit: Payer: Self-pay | Admitting: Physical Medicine & Rehabilitation

## 2015-02-09 ENCOUNTER — Telehealth: Payer: Self-pay | Admitting: Neurology

## 2015-02-09 ENCOUNTER — Telehealth: Payer: Self-pay | Admitting: *Deleted

## 2015-02-09 ENCOUNTER — Encounter: Payer: Medicare Other | Attending: Physical Medicine and Rehabilitation | Admitting: Registered Nurse

## 2015-02-09 ENCOUNTER — Encounter: Payer: Self-pay | Admitting: Registered Nurse

## 2015-02-09 VITALS — BP 126/97 | HR 87

## 2015-02-09 DIAGNOSIS — Z5181 Encounter for therapeutic drug level monitoring: Secondary | ICD-10-CM

## 2015-02-09 DIAGNOSIS — M2170 Unequal limb length (acquired), unspecified site: Secondary | ICD-10-CM | POA: Insufficient documentation

## 2015-02-09 DIAGNOSIS — R269 Unspecified abnormalities of gait and mobility: Secondary | ICD-10-CM | POA: Diagnosis not present

## 2015-02-09 DIAGNOSIS — Z79899 Other long term (current) drug therapy: Secondary | ICD-10-CM

## 2015-02-09 DIAGNOSIS — R29818 Other symptoms and signs involving the nervous system: Secondary | ICD-10-CM

## 2015-02-09 DIAGNOSIS — M961 Postlaminectomy syndrome, not elsewhere classified: Secondary | ICD-10-CM | POA: Diagnosis not present

## 2015-02-09 DIAGNOSIS — G894 Chronic pain syndrome: Secondary | ICD-10-CM

## 2015-02-09 DIAGNOSIS — M533 Sacrococcygeal disorders, not elsewhere classified: Secondary | ICD-10-CM | POA: Diagnosis not present

## 2015-02-09 DIAGNOSIS — R2689 Other abnormalities of gait and mobility: Secondary | ICD-10-CM

## 2015-02-09 DIAGNOSIS — M47816 Spondylosis without myelopathy or radiculopathy, lumbar region: Secondary | ICD-10-CM

## 2015-02-09 MED ORDER — FENTANYL 50 MCG/HR TD PT72: 50.0000 ug | MEDICATED_PATCH | TRANSDERMAL | Status: DC

## 2015-02-09 MED ORDER — OXYCODONE-ACETAMINOPHEN 10-325 MG PO TABS: 1.0000 | ORAL_TABLET | Freq: Four times a day (QID) | ORAL | Status: DC | PRN

## 2015-02-09 NOTE — Progress Notes (Signed)
Subjective:    Patient ID: Alexandria Mclaughlin, female    DOB: May 07, 1940, 74 y.o.   MRN: [id number]  HPI: Alexandria Mclaughlin is a 74 year old female who returns for follow up for chronic pain and medication refill. She states her pain is in her right buttock ( sacral ). She rates her pain 3.  Her current exercise regime is attending physical therapy twice a week she's receiving dry needling and  walking.  She seen Dr. Erlinda Hong neurologist  Regarding her gait disorder and loss of balance. Romberg +.  Educated on warning signs of stroke and call EMS if she notices any symptom she verbalizes understanding. Also instructed to call her neurologist with any concerns she verbalizes understanding.  Pain Inventory Average Pain 3 Pain Right Now 3 My pain is intermittent, sharp and stabbing  In the last 24 hours, has pain interfered with the following? General activity 2 Relation with others 2 Enjoyment of life 4 What TIME of day is your pain at its worst? daytime Sleep (in general) Fair  Pain is worse with: sitting, inactivity and standing Pain improves with: rest, heat/ice, therapy/exercise, pacing activities and medication Relief from Meds: 5  Mobility use a cane how many minutes can you walk? 30 do you drive?  yes Do you have any goals in this area?  yes  Function Do you have any goals in this area?  no  Neuro/Psych weakness trouble walking dizziness  Prior Studies Any changes since last visit?  yes  Physicians involved in your care Any changes since last visit?  no   Family History  Problem Relation Age of Onset  . Diabetes Mother   . Heart disease Mother   . Stroke Mother   . Heart disease Father   . Heart disease Brother    Social History   Social History  . Marital Status: Widowed    Spouse Name: N/A  . Number of Children: N/A  . Years of Education: N/A   Social History Main Topics  . Smoking status: Former Smoker -- 0.50 packs/day for 5 years    Types: Cigarettes   Quit date: 03/15/1968  . Smokeless tobacco: None  . Alcohol Use: Yes     Comment: occasional glass of wine  . Drug Use: No  . Sexual Activity: Not Asked   Other Topics Concern  . None   Social History Narrative   Past Surgical History  Procedure Laterality Date  . Retinal detachment surgery  9/13   Past Medical History  Diagnosis Date  . Cervical facet syndrome   . Enthesopathy of hip region   . Synovitis and tenosynovitis   . Disorders of sacrum   . Sciatic neuritis   . Depression   . Hypertension   . Vision abnormalities   . Headache    BP 126/97 mmHg  Pulse 87  SpO2 94%  Opioid Risk Score:   Fall Risk Score:  `1  Depression screen PHQ 2/9  Depression screen [facility] 2/9 11/09/2014 09/13/2014 05/12/2014  Decreased Interest 0 3 3  Down, Depressed, Hopeless 0 0 0  PHQ - 2 Score 0 3 3  Altered sleeping - - 0  Tired, decreased energy - - 3  Change in appetite - - 3  Feeling bad or failure about yourself  - - 0  Trouble concentrating - - 0  Moving slowly or fidgety/restless - - 0  Suicidal thoughts - - 0  PHQ-9 Score - - 9  Review of Systems  Musculoskeletal: Positive for gait problem.  Neurological: Positive for weakness.  All other systems reviewed and are negative.      Objective:   Physical Exam  Constitutional: She is oriented to person, place, and time. She appears well-developed and well-nourished.  HENT:  Head: Normocephalic and atraumatic.  Neck: Normal range of motion. Neck supple.  Cardiovascular: Normal rate and regular rhythm.   Pulmonary/Chest: Effort normal and breath sounds normal.  Musculoskeletal:  Normal Muscle Bulk and Muscle Testing Reveals: Upper Extremities: Full ROM and Muscle Strength on the Right 5/5 and Left 4/5 Right Sacral Tenderness Lower Extremities: Full ROM and Muscle Strength 5/5 Arises from chair with ease Narrow Based Gait   Neurological: She is alert and oriented to person, place, and time.  + Romberg  Skin:  Skin is warm and dry.  Psychiatric: She has a normal mood and affect.  Nursing note and vitals reviewed.         Assessment & Plan:  1. Post laminectomy syndrome with right buttock pain: Pain controlled on Fentanyl and Percocet.  Refilled: Fentanyl patch 75 mcg # 10- one patch every 3 days and Percocet 10/325 mg # 100--use one every 6 hours as needed. Second script given to accommodate scheduled appointment. 2. Gait Disorder: Continue  Physical Therapy/ Neurology Following 3. Loss of Balance: Neurology Following  20 minutes of face to face patient care time was spent during this visit. All questions were encouraged and answered.   F/U in 1 month

## 2015-02-09 NOTE — Telephone Encounter (Signed)
Pts order was just put in on 02-07-15. Both test have been schedule by Seth Bake from [facility]

## 2015-02-09 NOTE — Telephone Encounter (Signed)
Alexandria Mclaughlin is here seeing Alexandria Mclaughlin and she is very confused about her MRA of her head.  You called her twice about the test and sent her to neurologist. She saw the neurologist but I don't think she was explained what was going on by him other than she had not had stroke but had a large amount of white matter (??)and told her to follow up with PCP about managing BP and gave her information about decreasing her risk of stroke. She is very confused about why you called her about her MRA and felt she was having a stroke. She sent an email on 02/06/15 that hasn't had a response.  Can you give her a call again?

## 2015-02-09 NOTE — Telephone Encounter (Signed)
Pt called sts last OV Dr Erlinda Hong was to order echocardiogram and doppler but she has not rec'd call. Please call and advise

## 2015-02-12 ENCOUNTER — Encounter (HOSPITAL_COMMUNITY): Payer: Medicare Other

## 2015-02-12 ENCOUNTER — Telehealth: Payer: Self-pay | Admitting: Physical Medicine & Rehabilitation

## 2015-02-12 ENCOUNTER — Telehealth: Payer: Self-pay | Admitting: Neurology

## 2015-02-12 NOTE — Telephone Encounter (Signed)
Patient is calling and states that she would like to cancel her appointment with Dr. Erlinda Hong and reschedule with Dr. Leonie Man in regard to her recent diagnosis of a stroke as well as the reading of an echo-cardiogram she recently had.  She states she didn't click with Dr. Erlinda Hong. Please advise.

## 2015-02-12 NOTE — Telephone Encounter (Signed)
[name] spoke with her on the phone.

## 2015-02-12 NOTE — Telephone Encounter (Signed)
i called Brittinie---no answer.  i will try again later.

## 2015-02-12 NOTE — Telephone Encounter (Signed)
Pt is calling NP back in regards to some information she was supposed to get from [name] - pt claims that NP will know what she is referring to - please call

## 2015-02-12 NOTE — Telephone Encounter (Signed)
Follow current office policy

## 2015-02-13 ENCOUNTER — Telehealth: Payer: Self-pay | Admitting: Neurology

## 2015-02-13 NOTE — Telephone Encounter (Signed)
Called pt insurance company back at [phone]. Order # [id number] and is good for 02/13/15-03/14/15. Spoke to [place]. Pt already scheduled for test.

## 2015-02-13 NOTE — Telephone Encounter (Signed)
Pam with the [facility] is calling to get approval for an eco-cardiogram.  The patient has [employer]. Please call.

## 2015-02-13 NOTE — Telephone Encounter (Signed)
Called pt insurance company to get approval. They needed CPT code and could not find it in their system. Told me to call back once I found CPT code.   Called Pam back. [place] 93[address].

## 2015-02-14 ENCOUNTER — Ambulatory Visit (HOSPITAL_COMMUNITY)
Admission: RE | Admit: 2015-02-14 | Discharge: 2015-02-14 | Disposition: A | Discharge status: Home / Self Care | Payer: Medicare Other | Source: Ambulatory Visit | Attending: Neurology | Admitting: Neurology

## 2015-02-14 ENCOUNTER — Ambulatory Visit (HOSPITAL_BASED_OUTPATIENT_CLINIC_OR_DEPARTMENT_OTHER)
Admission: RE | Admit: 2015-02-14 | Discharge: 2015-02-14 | Disposition: A | Discharge status: Home / Self Care | Payer: Medicare Other | Source: Ambulatory Visit | Attending: Neurology | Admitting: Neurology

## 2015-02-14 DIAGNOSIS — I639 Cerebral infarction, unspecified: Secondary | ICD-10-CM | POA: Diagnosis not present

## 2015-02-14 DIAGNOSIS — I10 Essential (primary) hypertension: Secondary | ICD-10-CM | POA: Diagnosis not present

## 2015-02-14 DIAGNOSIS — I340 Nonrheumatic mitral (valve) insufficiency: Secondary | ICD-10-CM | POA: Diagnosis not present

## 2015-02-14 DIAGNOSIS — IMO0002 Reserved for concepts with insufficient information to code with codable children: Secondary | ICD-10-CM

## 2015-02-14 DIAGNOSIS — I5189 Other ill-defined heart diseases: Secondary | ICD-10-CM | POA: Insufficient documentation

## 2015-02-14 DIAGNOSIS — G9349 Other encephalopathy: Secondary | ICD-10-CM

## 2015-02-14 DIAGNOSIS — I071 Rheumatic tricuspid insufficiency: Secondary | ICD-10-CM | POA: Insufficient documentation

## 2015-02-14 DIAGNOSIS — I6523 Occlusion and stenosis of bilateral carotid arteries: Secondary | ICD-10-CM | POA: Diagnosis not present

## 2015-02-14 DIAGNOSIS — E785 Hyperlipidemia, unspecified: Secondary | ICD-10-CM | POA: Insufficient documentation

## 2015-02-14 DIAGNOSIS — I517 Cardiomegaly: Secondary | ICD-10-CM | POA: Diagnosis not present

## 2015-02-14 NOTE — Progress Notes (Signed)
  Echocardiogram 2D Echocardiogram has been performed.  Donata Clay 02/14/2015, 1:48 PM

## 2015-02-14 NOTE — Progress Notes (Signed)
*  PRELIMINARY RESULTS* Vascular Ultrasound Carotid Duplex (Doppler) has been completed.  Preliminary findings: Bilateral: No significant (1-39%) ICA stenosis. Antegrade vertebral flow.    Landry Mellow, RDMS, RVT  02/14/2015, 2:19 PM

## 2015-02-15 ENCOUNTER — Encounter: Payer: Self-pay | Admitting: Physical Medicine & Rehabilitation

## 2015-02-20 NOTE — Telephone Encounter (Signed)
I am more than happy to hand her over to Dr. Leonie Man. Thanks.  Rosalin Hawking, MD PhD Stroke Neurology 02/20/2015 5:41 PM

## 2015-02-21 ENCOUNTER — Encounter: Payer: Self-pay | Admitting: Neurology

## 2015-02-21 ENCOUNTER — Ambulatory Visit (INDEPENDENT_AMBULATORY_CARE_PROVIDER_SITE_OTHER): Payer: Medicare Other | Admitting: Neurology

## 2015-02-21 VITALS — BP 118/86 | HR 60 | Resp 20 | Ht 63.0 in | Wt 139.0 lb

## 2015-02-21 DIAGNOSIS — R0683 Snoring: Secondary | ICD-10-CM | POA: Diagnosis not present

## 2015-02-21 DIAGNOSIS — R0689 Other abnormalities of breathing: Secondary | ICD-10-CM

## 2015-02-21 DIAGNOSIS — F1190 Opioid use, unspecified, uncomplicated: Secondary | ICD-10-CM

## 2015-02-21 DIAGNOSIS — I679 Cerebrovascular disease, unspecified: Secondary | ICD-10-CM | POA: Diagnosis not present

## 2015-02-21 NOTE — Progress Notes (Signed)
SLEEP MEDICINE CLINIC   Provider:  Larey Seat, M D  Referring Provider: Merrilee Seashore, MD Primary Care Physician:  Merrilee Seashore, MD  Chief Complaint  Patient presents with  . New Patient (Initial Visit)    pt snores, referral from Dr. Erlinda Hong, never had sleep study, rm 61, alone    HPI:  Alexandria Mclaughlin is a 74 y.o. female , seen here as a referrall from Dr. Erlinda Hong, but lists Dr.  Ashby Dawes as a PCP.  Alexandria Mclaughlin is a 74 year old Caucasian right-handed female that presented to my colleague Dr. Erlinda Hong on December 14 after an MRI had shown small vessel disease. A comparison study from 2013 made it possible to see the progression of the small vessel disease. The patient is followed by Dr. Eda Keys would treats her for pain management but also has initiated physical therapy. She has a history of vertigo, hypertension, hyperlipidemia but not diabetes. Her daughter had reported that her mother is snoring during sleep and the patient has sometimes woken up gasping for air. A sleep study had never been done before.    Sleep habits are as follows: The patient usually retreats to the bedroom around 10 PM. Sleep onset is usually prominent on occasion she may need an hour to fall asleep. She usually sleeps soundly through the night. Surely she does not have nocturia, but only rare that she would wake up gasping for air. The patient prefers a lateral sleep position, and sleeps on one pillow only. On the occasion that she had been woken up gasping for air she had found herself in supine sleep position. She states that waking up gasping for air occurred even before narcotic pain medications were used to treat her chronic lower back pain. She rises at 8 AM and wakes spontaneously. She also has a pet cat but may wake her up.  The patient has been using Cymbalta, Duragesic patches, Neurontin, Mevacor, Percocet, Accupril, and  Trazodone( meant to help her sleep). There is a concern that the narcotics pain  medications may cause her to breathe more shallowly or have central apneas. It is presumed that the patient snores loudest when in supine position. She doesn't nap in daytime.   Sleep medical history and family sleep history:  No family history of OSA, patient is not aware of any sleep walking history, night terrors, nightmare disorders. She has never been concerned about her sleep or about any sleepiness.   Social history: The patient is not a smoker she does not drink alcohol and she has no history of illicit drug use. 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 Moved to [place] in 2006.  Caffeine : none.    Review of Systems: Out of a complete 14 system review, the patient complains of only the following symptoms, and all other reviewed systems are negative. Snoring, gasping, insomnia - on Trazodone and narcotics for chronic back pain. On sleep aids for about 6 years.   Epworth score  0 , Fatigue severity score 39  , depression score 2 points.   Social History   Social History  . Marital Status: Widowed    Spouse Name: N/A  . Number of Children: N/A  . Years of Education: N/A   Occupational History  . Not on file.   Social History Main Topics  . Smoking status: Former Smoker -- 0.50 packs/day for 5 years    Types: Cigarettes    Quit  date: 03/15/1968  . Smokeless tobacco: Not on file  . Alcohol Use: Yes     Comment: occasional glass of wine  . Drug Use: No  . Sexual Activity: Not on file   Other Topics Concern  . Not on file   Social History Narrative    Family History  Problem Relation Age of Onset  . Diabetes Mother   . Heart disease Mother   . Stroke Mother   . Heart disease Father   . Heart disease Brother     Past Medical History  Diagnosis Date  . Cervical facet syndrome   . Enthesopathy of hip region   . Synovitis and tenosynovitis   . Disorders of sacrum   . Sciatic neuritis   .  Depression   . Hypertension   . Vision abnormalities   . Headache     Past Surgical History  Procedure Laterality Date  . Retinal detachment surgery  9/13    Current Outpatient Prescriptions  Medication Sig Dispense Refill  . aspirin 81 MG chewable tablet Chew 81 mg by mouth daily.    . DULoxetine (CYMBALTA) 30 MG capsule TAKE 1 CAPSULE (30 MG TOTAL) BY MOUTH DAILY. 30 capsule 2  . fentaNYL (DURAGESIC - DOSED MCG/HR) 50 MCG/HR Place 1 patch (50 mcg total) onto the skin every 3 (three) days. 10 patch 0  . gabapentin (NEURONTIN) 300 MG capsule TAKE ONE CAPSULE BY MOUTH 3 TIMES A DAY AND 1 CAPSULE AT BEDTIME AS NEEDED 120 capsule 5  . lovastatin (MEVACOR) 40 MG tablet Take 40 mg by mouth daily.    Marland Kitchen oxyCODONE-acetaminophen (PERCOCET) 10-325 MG tablet Take 1 tablet by mouth every 6 (six) hours as needed. 100 tablet 0  . quinapril (ACCUPRIL) 20 MG tablet Take 20 mg by mouth daily.    . traZODone (DESYREL) 100 MG tablet TAKE 1 TABLET (100 MG TOTAL) BY MOUTH AT BEDTIME. 30 tablet 3   No current facility-administered medications for this visit.    Allergies as of 02/21/2015  . (No Known Allergies)    Vitals: BP 118/86 mmHg  Pulse 60  Resp 20  Ht 5\' 3"  (1.6 m)  Wt 139 lb (63.05 kg)  BMI 24.63 kg/m2 Last Weight:  Wt Readings from Last 1 Encounters:  02/21/15 139 lb (63.05 kg)   [id number] mass index is 24.63 kg/(m^2).     Last Height:   Ht Readings from Last 1 Encounters:  02/21/15 5\' 3"  (1.6 m)    Physical exam:  General: The patient is awake, alert and appears not in acute distress. The patient is well groomed. Head: Normocephalic, atraumatic. Neck is supple. Mallampati 3,  neck circumference:14. Nasal airflow restricted, TMJ click not evident. Retrognathia is seen.  Cardiovascular:  Regular rate and rhythm , without  murmurs or carotid bruit, and without distended neck veins. Respiratory: Lungs are clear to auscultation. Skin:  Without evidence of edema, or rash Trunk: BMI  is normal . The patient's posture is hunched   Neurologic exam : The patient is awake and alert, oriented to place and time.   Memory subjective described as intact.   Attention span & concentration ability appears normal.  Speech is fluent,  without  dysarthria, mild dysphonia , not aphasia.  Mood and affect are appropriate.  Cranial nerves: Pupils are equal and briskly reactive to light. Funduscopic exam without  evidence of pallor or edema.  Extraocular movements  in vertical and horizontal planes intact and without nystagmus. Visual fields by finger perimetry  are intact. Hearing to finger rub intact. Facial sensation intact to fine touch. Facial motor strength is symmetric and tongue and uvula move midline. Shoulder shrug was symmetrical.  Motor exam:  Normal tone, muscle bulk and symmetric strength in all extremities. Sensory:  Fine touch, pinprick and vibration were tested in all extremities.  Proprioception tested in the upper extremities was normal. Coordination: Rapid alternating movements finger-to-nose maneuver normal without evidence of ataxia, dysmetria or tremor.  Gait and station: Patient walks without assistive device and is able unassisted to climb up to the exam table.  Deep tendon reflexes: in the upper and lower extremities are symmetric and intact. Babinski maneuver response is downgoing.  The patient was advised of the nature of the diagnosed sleep disorder , the treatment options and risks for general a health and wellness arising from not treating the condition.  I spent more than 40 minutes of face to face time with the patient. Greater than 50% of time was spent in counseling and coordination of care. We have discussed the diagnosis and differential and I answered the patient's questions.     Assessment:  After physical and neurologic examination, review of laboratory studies,  Personal review of imaging studies, reports of other /same  Imaging studies ,  Results of  polysomnography/ neurophysiology testing and pre-existing records as far as provided in visit., my assessment is   1) Alexandria Mclaughlin has been suffering from insomnia for the last 6 years. While trazodone has helped her to sleep and her pain medicine certainly also help her to sleep through the night she has not noted any residual daytime sleepiness. She considers herself more fatigued and actually sleepy. She does not take daytime naps she does not have the irresistible urge to take a nap. Alexandria Mclaughlin seems to be mainly incapacitated by chronic pain for which she is in pain management.  The report of snoring seems to be related to her sleep position as well as gasping for air only occurring and supine. I will order a sleep study to make sure that we are not dealing with a central sleep apnea component that could be induced by medication., which can act as a respiratory suppressant.  The patient has a remote history of smoking over 30 years ago. This may partially explain the white matter changes. A split-night polysomnography especially attention to oxygen levels will be needed. The patient does not report being diagnosed with any emphysema or chronic bronchitis, she does not have a cardiac history and she does not have daily headaches. Therefore, capnography is not necessary during her sleep study.   Alexandria Partridge Patt Steinhardt MD  02/21/2015   CC:  Rosalin Hawking, MD   Merrilee Seashore, [address] [place], [place] [address]

## 2015-02-27 ENCOUNTER — Telehealth: Payer: Self-pay

## 2015-02-27 NOTE — Telephone Encounter (Signed)
Ok to schedule with me

## 2015-02-27 NOTE — Telephone Encounter (Signed)
See phone note

## 2015-02-27 NOTE — Telephone Encounter (Signed)
Rn notified patient that her carotid doppler, and echo were within normal limits. Pt verbalized understanding. Pt also wanted to change providers from Dr. Erlinda Hong to Dr.Sethi. Rn cancel pts appt with Dr.Xu and reschedule with Dr.Sethi for March 2017.

## 2015-02-28 DIAGNOSIS — M6281 Muscle weakness (generalized): Secondary | ICD-10-CM | POA: Diagnosis not present

## 2015-02-28 DIAGNOSIS — M545 Low back pain: Secondary | ICD-10-CM | POA: Diagnosis not present

## 2015-02-28 DIAGNOSIS — R293 Abnormal posture: Secondary | ICD-10-CM | POA: Diagnosis not present

## 2015-02-28 DIAGNOSIS — M25551 Pain in right hip: Secondary | ICD-10-CM | POA: Diagnosis not present

## 2015-03-02 DIAGNOSIS — H40023 Open angle with borderline findings, high risk, bilateral: Secondary | ICD-10-CM | POA: Diagnosis not present

## 2015-03-02 DIAGNOSIS — H524 Presbyopia: Secondary | ICD-10-CM | POA: Diagnosis not present

## 2015-03-02 DIAGNOSIS — H43813 Vitreous degeneration, bilateral: Secondary | ICD-10-CM | POA: Diagnosis not present

## 2015-03-02 DIAGNOSIS — Z961 Presence of intraocular lens: Secondary | ICD-10-CM | POA: Diagnosis not present

## 2015-03-13 DIAGNOSIS — M25551 Pain in right hip: Secondary | ICD-10-CM | POA: Diagnosis not present

## 2015-03-13 DIAGNOSIS — M545 Low back pain: Secondary | ICD-10-CM | POA: Diagnosis not present

## 2015-03-13 DIAGNOSIS — R293 Abnormal posture: Secondary | ICD-10-CM | POA: Diagnosis not present

## 2015-03-13 DIAGNOSIS — M6281 Muscle weakness (generalized): Secondary | ICD-10-CM | POA: Diagnosis not present

## 2015-03-20 ENCOUNTER — Encounter: Payer: Self-pay | Admitting: Physical Medicine & Rehabilitation

## 2015-03-20 ENCOUNTER — Encounter: Payer: PPO | Attending: Physical Medicine and Rehabilitation | Admitting: Physical Medicine & Rehabilitation

## 2015-03-20 VITALS — BP 140/78 | HR 80

## 2015-03-20 DIAGNOSIS — M47816 Spondylosis without myelopathy or radiculopathy, lumbar region: Secondary | ICD-10-CM | POA: Diagnosis not present

## 2015-03-20 DIAGNOSIS — R269 Unspecified abnormalities of gait and mobility: Secondary | ICD-10-CM

## 2015-03-20 DIAGNOSIS — M961 Postlaminectomy syndrome, not elsewhere classified: Secondary | ICD-10-CM | POA: Diagnosis not present

## 2015-03-20 DIAGNOSIS — M533 Sacrococcygeal disorders, not elsewhere classified: Secondary | ICD-10-CM

## 2015-03-20 DIAGNOSIS — M2170 Unequal limb length (acquired), unspecified site: Secondary | ICD-10-CM | POA: Diagnosis not present

## 2015-03-20 MED ORDER — OXYCODONE-ACETAMINOPHEN 10-325 MG PO TABS: 1.0000 | ORAL_TABLET | Freq: Four times a day (QID) | ORAL | Status: DC | PRN

## 2015-03-20 MED ORDER — FENTANYL 50 MCG/HR TD PT72: 50.0000 ug | MEDICATED_PATCH | TRANSDERMAL | Status: DC

## 2015-03-20 NOTE — Patient Instructions (Signed)
  PLEASE CALL ME WITH ANY PROBLEMS OR QUESTIONS (#[phone]).      

## 2015-03-20 NOTE — Progress Notes (Signed)
Subjective:    Patient ID: Alexandria Mclaughlin, female    DOB: August 06, 1940, 75 y.o.   MRN: [id number]  HPI  Alexandria Mclaughlin is here in follow up of her chronic pain. She is progressing with therapy. She is close to reaching an endpoint. Her gait and balance have improved---there is occasional vertigo but it is resolving---she practices her gaze stabilization exercises at home. She is pleased with her progress. She saw neurology after our last visit, (Dr. Erlinda Hong) who didn't feel there was a cerebral event. A sleep study has been ordered to assess her day time fatigue. Carotid dopplers and echo were normal. She is seeing Dr. Leonie Man in follow up later this March.    Pain Inventory Average Pain 4 Pain Right Now 4 My pain is intermittent, sharp and stabbing  In the last 24 hours, has pain interfered with the following? General activity 5 Relation with others 5 Enjoyment of life 5 What TIME of day is your pain at its worst? daytime Sleep (in general) Fair  Pain is worse with: sitting and standing Pain improves with: heat/ice, therapy/exercise and medication Relief from Meds: 7  Mobility Do you have any goals in this area?  no  Function Do you have any goals in this area?  no  Neuro/Psych weakness dizziness  Prior Studies Any changes since last visit?  no  Physicians involved in your care Any changes since last visit?  no   Family History  Problem Relation Age of Onset  . Diabetes Mother   . Heart disease Mother   . Stroke Mother   . Heart disease Father   . Heart disease Brother    Social History   Social History  . Marital Status: Widowed    Spouse Name: N/A  . Number of Children: N/A  . Years of Education: N/A   Social History Main Topics  . Smoking status: Former Smoker -- 0.50 packs/day for 5 years    Types: Cigarettes    Quit date: 03/15/1968  . Smokeless tobacco: None  . Alcohol Use: Yes     Comment: occasional glass of wine  . Drug Use: No  . Sexual Activity: Not Asked    Other Topics Concern  . None   Social History Narrative   Past Surgical History  Procedure Laterality Date  . Retinal detachment surgery  9/13   Past Medical History  Diagnosis Date  . Cervical facet syndrome   . Enthesopathy of hip region   . Synovitis and tenosynovitis   . Disorders of sacrum   . Sciatic neuritis   . Depression   . Hypertension   . Vision abnormalities   . Headache    BP 140/78 mmHg  Pulse 80  SpO2 95%  Opioid Risk Score:   Fall Risk Score:  `1  Depression screen PHQ 2/9  Depression screen [facility] 2/9 03/20/2015 02/21/2015 11/09/2014 09/13/2014 05/12/2014  Decreased Interest 0 1 0 3 3  Down, Depressed, Hopeless 0 0 0 0 0  PHQ - 2 Score 0 1 0 3 3  Altered sleeping - - - - 0  Tired, decreased energy - - - - 3  Change in appetite - - - - 3  Feeling bad or failure about yourself  - - - - 0  Trouble concentrating - - - - 0  Moving slowly or fidgety/restless - - - - 0  Suicidal thoughts - - - - 0  PHQ-9 Score - - - - 9    Review  of Systems  All other systems reviewed and are negative.      Objective:   Physical Exam  Physical Exam  The patient is alert and appropriate. Pupils equal round and reactive to light. Extraocular eye movements intact. Heart is regular chest is clear and abdomen is soft and nontender. Low back is somewhat tender still around the operative site and inferiorly. She has pain with palpation to the right PSIS area as well as the left--once again---it may be better. Facet maneuvers are positive left more so than right today.  Strength is generally intact at 5/ 5 out of 5 in both legs. Reflexes appear symmetrical 2+ bilaterally. Sensory exam is normal in both legs. Strength 5/5 in UE's. Romberg + but she is able to correct. Cannot walk in tandem---loses balance easily. [facility] coordination much improved left arm and leg today. No nystagmus. Cognitively she is alert and appropriate. Behaviorally she is intact. Gait is normal.  Psych: pt  appears a little flat,depressed    Assessment & Plan:   ASSESSMENT:  1. Lumbar post laminectomy syndrome.  2. Left leg discrepancy.  3. New onset of back pain, now affecting left more than right. Presentation and xrays consistent with listhesis of L4 on L5 and facet syndrome  4. New left limb, truncal ataxia. MRI negative for acute changes---showed a lot of white matter changes, ?right frontal chronic/subacute infarct?---neurology does not think there was an event. She has improved with vestibular therapy/balance training   PLAN:  1. Gabapentin for neuropathic pain.  2. Percocet 10/325 one p.o. q.6 h. p.r.n., #100  3. Continue fentanyl at 32mcg/hr as this seems to be working well for her.. Second rx'es were given of narcotics for next month. Consider decrease to 37.24mcg potentially this year.  4. Balance better after therapy. Will be following up with neurology in a month or two. HEP to continue. Encouraged her to continue her gaze exercises 5. Sleep study per neuro .  She will follow up with me in about 2 month.Thirty minutes of face to face patient care time were spent during this visit. All questions were encouraged and answered. Marland Kitchen

## 2015-03-28 DIAGNOSIS — E782 Mixed hyperlipidemia: Secondary | ICD-10-CM | POA: Diagnosis not present

## 2015-03-28 DIAGNOSIS — I10 Essential (primary) hypertension: Secondary | ICD-10-CM | POA: Diagnosis not present

## 2015-03-28 DIAGNOSIS — N390 Urinary tract infection, site not specified: Secondary | ICD-10-CM | POA: Diagnosis not present

## 2015-03-28 DIAGNOSIS — Z0000 Encounter for general adult medical examination without abnormal findings: Secondary | ICD-10-CM | POA: Diagnosis not present

## 2015-04-04 DIAGNOSIS — I10 Essential (primary) hypertension: Secondary | ICD-10-CM | POA: Diagnosis not present

## 2015-04-04 DIAGNOSIS — F321 Major depressive disorder, single episode, moderate: Secondary | ICD-10-CM | POA: Diagnosis not present

## 2015-04-04 DIAGNOSIS — E782 Mixed hyperlipidemia: Secondary | ICD-10-CM | POA: Diagnosis not present

## 2015-04-04 DIAGNOSIS — E0590 Thyrotoxicosis, unspecified without thyrotoxic crisis or storm: Secondary | ICD-10-CM | POA: Diagnosis not present

## 2015-04-04 DIAGNOSIS — Z0000 Encounter for general adult medical examination without abnormal findings: Secondary | ICD-10-CM | POA: Diagnosis not present

## 2015-04-13 DIAGNOSIS — H43813 Vitreous degeneration, bilateral: Secondary | ICD-10-CM | POA: Diagnosis not present

## 2015-04-13 DIAGNOSIS — H40023 Open angle with borderline findings, high risk, bilateral: Secondary | ICD-10-CM | POA: Diagnosis not present

## 2015-04-13 DIAGNOSIS — H532 Diplopia: Secondary | ICD-10-CM | POA: Diagnosis not present

## 2015-04-13 DIAGNOSIS — Z961 Presence of intraocular lens: Secondary | ICD-10-CM | POA: Diagnosis not present

## 2015-05-01 ENCOUNTER — Ambulatory Visit: Payer: Medicare Other | Admitting: Neurology

## 2015-05-21 ENCOUNTER — Encounter: Payer: Self-pay | Admitting: Neurology

## 2015-05-21 ENCOUNTER — Ambulatory Visit (INDEPENDENT_AMBULATORY_CARE_PROVIDER_SITE_OTHER): Payer: PPO | Admitting: Neurology

## 2015-05-21 VITALS — BP 143/85 | HR 67 | Ht 63.0 in | Wt 141.2 lb

## 2015-05-21 DIAGNOSIS — H532 Diplopia: Secondary | ICD-10-CM | POA: Diagnosis not present

## 2015-05-21 NOTE — Patient Instructions (Signed)
I had a long d/w patient about her symptoms of sudden onset of diplopia and gait ataxia likely represent tiny brainstem infarct not visualized on MRI as MRI was done more than 1 week after the onset of her symptoms. We discussed risk for recurrent stroke/TIAs, personally independently reviewed imaging studies and stroke evaluation results and answered questions.Continue aspirin 81 mg daily  for secondary stroke prevention and maintain strict control of hypertension with blood pressure goal below 130/90, diabetes with hemoglobin A1c goal below 6.5% and lipids with LDL cholesterol goal below 70 mg/dL. I also advised the patient to eat a healthy diet with plenty of whole grains, cereals, fruits and vegetables, exercise regularly and maintain ideal body weight .we also discussed fall risk prevention precautions Followup in the future with me in 6 months or call earlier if necessary Fall Prevention in the [place] can cause injuries and can affect people from all age groups. There are many simple things that you can do to make your home safe and to help prevent falls. WHAT CAN I DO ON THE OUTSIDE OF MY HOME?  Regularly repair the edges of walkways and driveways and fix any cracks.  Remove high doorway thresholds.  Trim any shrubbery on the main path into your home.  Use bright outdoor lighting.  Clear walkways of debris and clutter, including tools and rocks.  Regularly check that handrails are securely fastened and in good repair. Both sides of any steps should have handrails.  Install guardrails along the edges of any raised decks or porches.  Have leaves, snow, and ice cleared regularly.  Use sand or salt on walkways during winter months.  In the garage, clean up any spills right away, including grease or oil spills. WHAT CAN I DO IN THE BATHROOM?  Use night lights.  Install grab bars by the toilet and in the tub and shower. Do not use towel bars as grab bars.  Use non-skid mats or  decals on the floor of the tub or shower.  If you need to sit down while you are in the shower, use a plastic, non-slip stool.Marland Kitchen  Keep the floor dry. Immediately clean up any water that spills on the floor.  Remove soap buildup in the tub or shower on a regular basis.  Attach bath mats securely with double-sided non-slip rug tape.  Remove throw rugs and other tripping hazards from the floor. WHAT CAN I DO IN THE BEDROOM?  Use night lights.  Make sure that a bedside light is easy to reach.  Do not use oversized bedding that drapes onto the floor.  Have a firm chair that has side arms to use for getting dressed.  Remove throw rugs and other tripping hazards from the floor. WHAT CAN I DO IN THE KITCHEN?   Clean up any spills right away.  Avoid walking on wet floors.  Place frequently used items in easy-to-reach places.  If you need to reach for something above you, use a sturdy step stool that has a grab bar.  Keep electrical cables out of the way.  Do not use floor polish or wax that makes floors slippery. If you have to use wax, make sure that it is non-skid floor wax.  Remove throw rugs and other tripping hazards from the floor. WHAT CAN I DO IN THE STAIRWAYS?  Do not leave any items on the stairs.  Make sure that there are handrails on both sides of the stairs. Fix handrails that are broken or  loose. Make sure that handrails are as long as the stairways.  Check any carpeting to make sure that it is firmly attached to the stairs. Fix any carpet that is loose or worn.  Avoid having throw rugs at the top or bottom of stairways, or secure the rugs with carpet tape to prevent them from moving.  Make sure that you have a light switch at the top of the stairs and the bottom of the stairs. If you do not have them, have them installed. WHAT ARE SOME OTHER FALL PREVENTION TIPS?  Wear closed-toe shoes that fit well and support your feet. Wear shoes that have rubber soles or low  heels.  When you use a stepladder, make sure that it is completely opened and that the sides are firmly locked. Have someone hold the ladder while you are using it. Do not climb a closed stepladder.  Add color or contrast paint or tape to grab bars and handrails in your home. Place contrasting color strips on the first and last steps.  Use mobility aids as needed, such as canes, walkers, scooters, and crutches.  Turn on lights if it is dark. Replace any light bulbs that burn out.  Set up furniture so that there are clear paths. Keep the furniture in the same spot.  Fix any uneven floor surfaces.  Choose a carpet design that does not hide the edge of steps of a stairway.  Be aware of any and all pets.  Review your medicines with your healthcare provider. Some medicines can cause dizziness or changes in blood pressure, which increase your risk of falling. Talk with your health care provider about other ways that you can decrease your risk of falls. This may include working with a physical therapist or trainer to improve your strength, balance, and endurance.   This information is not intended to replace advice given to you by your health care provider. Make sure you discuss any questions you have with your health care provider.   Document Released: 01/31/2002 Document Revised: 06/27/2014 Document Reviewed: 03/17/2014 Elsevier Interactive Patient Education [employer].

## 2015-05-21 NOTE — Progress Notes (Signed)
NEUROLOGY CLINIC FOLLOW UP VISIT NOTE  NAME: BRITTNIE BENWAY DOB: March 30, 1940 REFERRING PHYSICIAN: Meredith Staggers, MD  I saw Alexandria Mclaughlin as a second opinion follow up in the neurovascular clinic today regarding  Chief Complaint  Patient presents with  . Follow-up    follow up for balancing, vision,pts vision is good,and balance  has gotten better  .  HPI: Dr Phoebe Sharps consult note 02/06/2015 :  Alexandria Mclaughlin is a 75 y.o. female with PMH of HTN, HLD, BPPV, back pain s/p surgery who presents as a new patient for Imbalance, double vision, headache, memory loss.   Patient stated that due to back pain she had wobbly walking for a while and in the going PT and pain control with rehabilitation. However, for the last 2 months, she noticed her walking getting worse, more wobbly, imbalance, veering to the left on walking. However, there is no history of falls.   She also complains one episode of binocular double vision about 2 weeks ago, lasting 5 minutes, resolved without recurrence. Denies any other symptoms associate with double vision, no facial weakness, speech difficulty, dizziness, weakness or numbness.   She also complained of headache, off and on, however lately low-grade constant headache, different from her typical migraine headache. Taking Excedrin for headache occasionally.   She also complained of memory loss for the last one year, has to write down things otherwise will forget. Sometimes difficulty come up with words. However, still able to do all her ADLs, financially independent, no flooding or burning accidents, no loss in familiar environment.   She has been following with Dr. Naaman Plummer, had MRI and MRA head showed no acute abnormality, no acute stroke, and an unremarkable MRA. However, her white matter small vessel disease seems much progressed since 3 years ago.  She had a history of a back pain, status post surgery 3, last surgery 4 years ago. She had constant back pain, undergoing PT  treatment as well as pain management with Percocet. She also has history of BPPV, undergoing PT maneuver training. History of hypertension, hyperlipidemia on quinapril and lovastatin. Denies any history of diabetes. As per daughter, she had snoring during sleep, and patient also admits she sometimes woke up with gasping for air. Never had a sleep study done before. Denies smoking, alcohol, illicit drugs.  Patient denies any recent stress, anxiety, or depression. Stated that her life is simple and stress free. Update 05/21/2015 : Alexandria Mclaughlin requested that she switch her neurological follow-up to me and hence am seeing her today instead of Dr. Erlinda Hong. She informs me that about 4 months ago. She noticed sudden onset of 4 horizontal diplopia while driving she was sitting 2 cars. This is essentially is still present and she has seen an eye doctor and is being considered for prisms. She has no double vision when she is reading or looking at near objects. This is binocular and disappears when she closes either eye. Roughly the same time she also noticed that her equilibrium and balance was off. She at times she may lean to one side and needs to hold on that she's had no falls. Her MRI scan of the brain done on December 17 which have personally reviewed shows extensive white matter changes similar particularly in the midline and paramedian pons and these appear to be increased compared with previous MRI from 2014. These could represent small vessel chronic ischemic changes though a tiny brainstem infarct may have appeared in not captured as the  MRI was performed more than a week after onset of her symptoms. Dr. Erlinda Hong had ordered a carotid ultrasound which was done on 01/2115 which have reviewed showed no significant extra stresses as well as transthoracic echo which showed normal ejection fraction without cardiac source of embolism. The patient states she is tolerating aspirin 81 mg well without bleeding but only minor bruising.  She had lipid profile checked a month ago and was satisfactory. Her blood pressure is well controlled and today it is 143/6 finer office. She has seen Dr. Brett Fairy for evaluation for possible sleep apnea but she has not yet scheduled the polysomnogram study. She has no new complaints. Past Medical History  Diagnosis Date  . Cervical facet syndrome   . Enthesopathy of hip region   . Synovitis and tenosynovitis   . Disorders of sacrum   . Sciatic neuritis   . Depression   . Hypertension   . Vision abnormalities   . Headache    Past Surgical History  Procedure Laterality Date  . Retinal detachment surgery  9/13   Family History  Problem Relation Age of Onset  . Diabetes Mother   . Heart disease Mother   . Stroke Mother   . Heart disease Father   . Heart disease Brother    Current Outpatient Prescriptions  Medication Sig Dispense Refill  . aspirin 81 MG chewable tablet Chew 81 mg by mouth daily.    . DULoxetine (CYMBALTA) 30 MG capsule TAKE 1 CAPSULE (30 MG TOTAL) BY MOUTH DAILY. 30 capsule 2  . fentaNYL (DURAGESIC - DOSED MCG/HR) 50 MCG/HR Place 1 patch (50 mcg total) onto the skin every 3 (three) days. 10 patch 0  . gabapentin (NEURONTIN) 300 MG capsule TAKE ONE CAPSULE BY MOUTH 3 TIMES A DAY AND 1 CAPSULE AT BEDTIME AS NEEDED 120 capsule 5  . lovastatin (MEVACOR) 40 MG tablet Take 40 mg by mouth daily.    Marland Kitchen oxyCODONE-acetaminophen (PERCOCET) 10-325 MG tablet Take 1 tablet by mouth every 6 (six) hours as needed. 100 tablet 0  . quinapril (ACCUPRIL) 20 MG tablet Take 20 mg by mouth daily.    . traZODone (DESYREL) 100 MG tablet TAKE 1 TABLET (100 MG TOTAL) BY MOUTH AT BEDTIME. 30 tablet 3   No current facility-administered medications for this visit.   No Known Allergies Social History   Social History  . Marital Status: Widowed    Spouse Name: N/A  . Number of Children: N/A  . Years of Education: N/A   Occupational History  . Not on file.   Social History Main Topics    . Smoking status: Former Smoker -- 0.50 packs/day for 5 years    Types: Cigarettes    Quit date: 03/15/1968  . Smokeless tobacco: Not on file  . Alcohol Use: 0.6 oz/week    1 Glasses of wine per week     Comment: occasional glass of wine  . Drug Use: No  . Sexual Activity: Not on file   Other Topics Concern  . Not on file   Social History Narrative    Review of Systems Full 14 system review of systems performed and notable only for those listed, all others are neg:   Dizziness, gait imbalance, diplopia, back pain and all other systems negative  Physical Exam  Filed Vitals:   05/21/15 1444  BP: 143/85  Pulse: 67    General - Well nourished, well developed, in no apparent distress.  Ophthalmologic - fundi not visualized due to  eye movement.  Cardiovascular - Regular rate and rhythm.  Musculoskeletal-no deformity Neck - supple, no nuchal rigidity . Skin few petechiae. No rash.  Distal pulses are well felt  Neurological Exam ;  Awake  Alert oriented x 3. Normal speech and language.eye movements full without nystagmus. No subjective diplopia on near gaze but only on distant gaze..fundi were not visualized. Vision acuity and fields appear normal. Hearing is normal. Palatal movements are normal. Face symmetric. Tongue midline. Normal strength, tone, reflexes and coordination. Normal sensation. Gait cautious and slightly broad-based. Unable to walk tandem due to severe difficulty  I have personally reviewed the radiological images below and agree with the radiology interpretations.  06/18/11 MRI brain  No acute intracranial findings. Mild chronic microvascular ischemic change, possibly age related or secondary to hypertension.  01/31/15 MRI and MRA brain 1. No acute intracranial abnormality. 2. Progressive, moderate chronic small vessel ischemic disease. 3. Unremarkable head MRA aside from normal variant anatomy.  Lab Review January 2016 A1c 5.8, LDL 96, TSH 0.473     Assessment and Plan:   In summary, Alexandria Mclaughlin is a 75 y.o. female with PMH of  presents HTN, HLD, BPPV, back pain s/p surgery who developed gait imbalance, double vision, headache, memory loss 4 months ago possibly due to tiny brainstem infarct not visualized on MRI due to small vessel disease. Multiple vascular risk factors of hypertension, hyperlipidemia and cerebrovascular disease    I had a long d/w patient about her symptoms of sudden onset of diplopia and gait ataxia likely represent tiny brainstem infarct not visualized on MRI as MRI was done more than 1 week after the onset of her symptoms. We discussed risk for recurrent stroke/TIAs, personally independently reviewed imaging studies and stroke evaluation results and answered questions.Continue aspirin 81 mg daily  for secondary stroke prevention and maintain strict control of hypertension with blood pressure goal below 130/90, diabetes with hemoglobin A1c goal below 6.5% and lipids with LDL cholesterol goal below 70 mg/dL. I also advised the patient to eat a healthy diet with plenty of whole grains, cereals, fruits and vegetables, exercise regularly and maintain ideal body weight .we also discussed fall risk prevention precautions. Greater than 50% of time during this 30 minute visit was spent on counseling and coordination of care. Followup in the future with me in 6 months or call earlier if necessary No orders of the defined types were placed in this encounter.    No orders of the defined types were placed in this encounter.    Patient Instructions  I had a long d/w patient about her symptoms of sudden onset of diplopia and gait ataxia likely represent tiny brainstem infarct not visualized on MRI as MRI was done more than 1 week after the onset of her symptoms. We discussed risk for recurrent stroke/TIAs, personally independently reviewed imaging studies and stroke evaluation results and answered questions.Continue aspirin 81 mg daily  for  secondary stroke prevention and maintain strict control of hypertension with blood pressure goal below 130/90, diabetes with hemoglobin A1c goal below 6.5% and lipids with LDL cholesterol goal below 70 mg/dL. I also advised the patient to eat a healthy diet with plenty of whole grains, cereals, fruits and vegetables, exercise regularly and maintain ideal body weight .We also discussed fall risk prevention precautions.Keep her appointment with ophthalmologist for prisms to treat her diplopia. Followup in the future with me in 6 months or call earlier if necessary  Antony Contras, MD [facility] [phone] 3rd  [address] [place], [place] [address] [phone]

## 2015-05-23 ENCOUNTER — Encounter: Payer: PPO | Attending: Physical Medicine and Rehabilitation | Admitting: Physical Medicine & Rehabilitation

## 2015-05-23 ENCOUNTER — Encounter: Payer: Self-pay | Admitting: Physical Medicine & Rehabilitation

## 2015-05-23 VITALS — BP 124/75 | HR 70 | Resp 14

## 2015-05-23 DIAGNOSIS — M533 Sacrococcygeal disorders, not elsewhere classified: Secondary | ICD-10-CM | POA: Diagnosis not present

## 2015-05-23 DIAGNOSIS — M961 Postlaminectomy syndrome, not elsewhere classified: Secondary | ICD-10-CM | POA: Diagnosis not present

## 2015-05-23 DIAGNOSIS — M47816 Spondylosis without myelopathy or radiculopathy, lumbar region: Secondary | ICD-10-CM | POA: Diagnosis not present

## 2015-05-23 DIAGNOSIS — M2170 Unequal limb length (acquired), unspecified site: Secondary | ICD-10-CM | POA: Insufficient documentation

## 2015-05-23 DIAGNOSIS — R269 Unspecified abnormalities of gait and mobility: Secondary | ICD-10-CM

## 2015-05-23 MED ORDER — FENTANYL 50 MCG/HR TD PT72: 50.0000 ug | MEDICATED_PATCH | TRANSDERMAL | Status: DC

## 2015-05-23 MED ORDER — OXYCODONE-ACETAMINOPHEN 10-325 MG PO TABS: 1.0000 | ORAL_TABLET | Freq: Three times a day (TID) | ORAL | Status: DC | PRN

## 2015-05-23 NOTE — Patient Instructions (Signed)
  PLEASE CALL ME WITH ANY PROBLEMS OR QUESTIONS (#[phone]).      

## 2015-05-23 NOTE — Progress Notes (Signed)
Subjective:    Patient ID: Alexandria Mclaughlin, female    DOB: April 19, 1940, 75 y.o.   MRN: [id number]  HPI   Mrs. Landgrebe is here in follow up of her chronic pain. She has been fairly stable. She did lose a close friend who passed away a few weeks ago. She used more percocet when her stress levels were high but otherwise has been able to use 2-3 percocet per day. She had #27 left today.   She does notice some occasional radiation of pain down the right leg but it's not consistent. Her pain there has not increased. She tries to stay active with exercise and ROM.  She saw Dr. Leonie Man who felt she had a small brainstem infarct and recommended close management of cholesterol, BP, etc.     Pain Inventory Average Pain 4 Pain Right Now 4 My pain is intermittent, sharp and stabbing  In the last 24 hours, has pain interfered with the following? General activity 5 Relation with others 4 Enjoyment of life 4 What TIME of day is your pain at its worst? daytime Sleep (in general) Fair  Pain is worse with: sitting and standing Pain improves with: rest, heat/ice and medication Relief from Meds: 6  Mobility Do you have any goals in this area?  no  Function Do you have any goals in this area?  no  Neuro/Psych No problems in this area  Prior Studies Any changes since last visit?  no  Physicians involved in your care Any changes since last visit?  no   Family History  Problem Relation Age of Onset  . Diabetes Mother   . Heart disease Mother   . Stroke Mother   . Heart disease Father   . Heart disease Brother    Social History   Social History  . Marital Status: Widowed    Spouse Name: N/A  . Number of Children: N/A  . Years of Education: N/A   Social History Main Topics  . Smoking status: Former Smoker -- 0.50 packs/day for 5 years    Types: Cigarettes    Quit date: 03/15/1968  . Smokeless tobacco: None  . Alcohol Use: 0.6 oz/week    1 Glasses of wine per week     Comment:  occasional glass of wine  . Drug Use: No  . Sexual Activity: Not Asked   Other Topics Concern  . None   Social History Narrative   Past Surgical History  Procedure Laterality Date  . Retinal detachment surgery  9/13   Past Medical History  Diagnosis Date  . Cervical facet syndrome   . Enthesopathy of hip region   . Synovitis and tenosynovitis   . Disorders of sacrum   . Sciatic neuritis   . Depression   . Hypertension   . Vision abnormalities   . Headache    BP 124/75 mmHg  Pulse 70  Resp 14  SpO2 95%  Opioid Risk Score:   Fall Risk Score:  `1  Depression screen PHQ 2/9  Depression screen [facility] 2/9 05/23/2015 03/20/2015 02/21/2015 11/09/2014 09/13/2014 05/12/2014  Decreased Interest 0 0 1 0 3 3  Down, Depressed, Hopeless 0 0 0 0 0 0  PHQ - 2 Score 0 0 1 0 3 3  Altered sleeping 0 - - - - 0  Tired, decreased energy 3 - - - - 3  Change in appetite 3 - - - - 3  Feeling bad or failure about yourself  0 - - - -  0  Trouble concentrating 0 - - - - 0  Moving slowly or fidgety/restless 0 - - - - 0  Suicidal thoughts 0 - - - - 0  PHQ-9 Score 6 - - - - 9     Review of Systems  All other systems reviewed and are negative.      Objective:   Physical Exam  Physical Exam  The patient is alert and appropriate. Pupils equal round and reactive to light. Extraocular eye movements intact. Heart is regular chest is clear and abdomen is soft and nontender. Low back is somewhat tender still around the operative site and inferiorly. She has pain with palpation to the right PSIS area as well as the left--once again---it may be better. Facet maneuvers are positive left more so than right today. Strength is generally intact at 5/ 5 out of 5 in both legs. Reflexes appear symmetrical 2+ bilaterally. Sensory exam is normal in both legs. Strength 5/5 in UE's. Romberg + but she is able to correct. Cannot walk in tandem---loses balance easily. [facility] coordination much improved left arm and leg today.  No nystagmus. Cognitively she is alert and appropriate. Behaviorally she is intact. Gait is normal.  Psych: pt appears a little flat,depressed    Assessment & Plan:   ASSESSMENT:  1. Lumbar post laminectomy syndrome.  2. Left leg discrepancy.  3. New onset of back pain, now affecting left more than right. Presentation and xrays consistent with listhesis of L4 on L5 and facet syndrome  4. New left limb, truncal ataxia. MRI negative for acute changes---showed a lot of white matter changes, ?right frontal chronic/subacute infarct?---neurology does not think there was an event. She has improved with vestibular therapy/balance training    PLAN:  1. Gabapentin for neuropathic pain.  2. Percocet 10/325 one p.o. q.6 h. p.r.n., #90  3. Continue fentanyl at 109mcg/hr as this seems to be working well for her.. Second rx'es were given of narcotics for next month. Consider decrease to 37.84mcg potentially this year.  4. Maintain HEP, keeping safety, balance in mind. .  She will follow up with me in about 2 months.15 minutes of face to face patient care time were spent during this visit. All questions were encouraged and answered

## 2015-06-02 ENCOUNTER — Other Ambulatory Visit: Payer: Self-pay | Admitting: Physical Medicine & Rehabilitation

## 2015-06-03 NOTE — Telephone Encounter (Signed)
Please advise on refill? I do not see recent documentation on this.

## 2015-06-06 ENCOUNTER — Encounter: Payer: Self-pay | Admitting: Physical Medicine & Rehabilitation

## 2015-07-13 DIAGNOSIS — M7501 Adhesive capsulitis of right shoulder: Secondary | ICD-10-CM | POA: Diagnosis not present

## 2015-07-13 DIAGNOSIS — M7541 Impingement syndrome of right shoulder: Secondary | ICD-10-CM | POA: Diagnosis not present

## 2015-07-24 DIAGNOSIS — M6281 Muscle weakness (generalized): Secondary | ICD-10-CM | POA: Diagnosis not present

## 2015-07-24 DIAGNOSIS — M25511 Pain in right shoulder: Secondary | ICD-10-CM | POA: Diagnosis not present

## 2015-07-24 DIAGNOSIS — M7501 Adhesive capsulitis of right shoulder: Secondary | ICD-10-CM | POA: Diagnosis not present

## 2015-07-25 ENCOUNTER — Encounter: Payer: Self-pay | Admitting: Physical Medicine & Rehabilitation

## 2015-07-25 ENCOUNTER — Encounter: Payer: PPO | Attending: Physical Medicine and Rehabilitation | Admitting: Physical Medicine & Rehabilitation

## 2015-07-25 VITALS — BP 147/97 | HR 77 | Resp 12

## 2015-07-25 DIAGNOSIS — G894 Chronic pain syndrome: Secondary | ICD-10-CM | POA: Diagnosis not present

## 2015-07-25 DIAGNOSIS — Z79899 Other long term (current) drug therapy: Secondary | ICD-10-CM

## 2015-07-25 DIAGNOSIS — Z5181 Encounter for therapeutic drug level monitoring: Secondary | ICD-10-CM | POA: Diagnosis not present

## 2015-07-25 DIAGNOSIS — M533 Sacrococcygeal disorders, not elsewhere classified: Secondary | ICD-10-CM | POA: Diagnosis not present

## 2015-07-25 DIAGNOSIS — M47816 Spondylosis without myelopathy or radiculopathy, lumbar region: Secondary | ICD-10-CM

## 2015-07-25 DIAGNOSIS — T402X5A Adverse effect of other opioids, initial encounter: Secondary | ICD-10-CM

## 2015-07-25 DIAGNOSIS — M961 Postlaminectomy syndrome, not elsewhere classified: Secondary | ICD-10-CM

## 2015-07-25 DIAGNOSIS — M2170 Unequal limb length (acquired), unspecified site: Secondary | ICD-10-CM | POA: Diagnosis not present

## 2015-07-25 DIAGNOSIS — K5903 Drug induced constipation: Secondary | ICD-10-CM

## 2015-07-25 DIAGNOSIS — R269 Unspecified abnormalities of gait and mobility: Secondary | ICD-10-CM

## 2015-07-25 HISTORY — DX: Adverse effect of other opioids, initial encounter: T40.2X5A

## 2015-07-25 HISTORY — DX: Drug induced constipation: K59.03

## 2015-07-25 MED ORDER — OXYCODONE-ACETAMINOPHEN 10-325 MG PO TABS: 1.0000 | ORAL_TABLET | Freq: Three times a day (TID) | ORAL | Status: DC | PRN

## 2015-07-25 MED ORDER — NALOXEGOL OXALATE 12.5 MG PO TABS: 12.5000 mg | ORAL_TABLET | Freq: Every day | ORAL | Status: DC

## 2015-07-25 MED ORDER — FENTANYL 50 MCG/HR TD PT72: 50.0000 ug | MEDICATED_PATCH | TRANSDERMAL | Status: DC

## 2015-07-25 NOTE — Progress Notes (Signed)
Subjective:    Patient ID: Alexandria Mclaughlin, female    DOB: [birth year], 75 y.o.   MRN: [id number]  HPI   Alexandria Mclaughlin is here in follow up of her chronic pain. She has developed a right shoulder capsulitis. She has been sent to therapy. Some dry needling has occurred.   She maintains on percocet for pain control which seems to be effective. She has struggled more with constipation the last few months.   Pain Inventory Average Pain 4 Pain Right Now 4 My pain is intermittent, sharp and stabbing  In the last 24 hours, has pain interfered with the following? General activity 4 Relation with others 3 Enjoyment of life 3 What TIME of day is your pain at its worst? daytime Sleep (in general) Good  Pain is worse with: sitting and standing Pain improves with: heat/ice, pacing activities and medication Relief from Meds: 7  Mobility Do you have any goals in this area?  no  Function retired Do you have any goals in this area?  no  Neuro/Psych No problems in this area  Prior Studies Any changes since last visit?  no  Physicians involved in your care Any changes since last visit?  yes   Family History  Problem Relation Age of Onset  . Diabetes Mother   . Heart disease Mother   . Stroke Mother   . Heart disease Father   . Heart disease Brother    Social History   Social History  . Marital Status: Widowed    Spouse Name: N/A  . Number of Children: N/A  . Years of Education: N/A   Social History Main Topics  . Smoking status: Former Smoker -- 0.50 packs/day for 5 years    Types: Cigarettes    Quit date: 03/15/1968  . Smokeless tobacco: None  . Alcohol Use: 0.6 oz/week    1 Glasses of wine per week     Comment: occasional glass of wine  . Drug Use: No  . Sexual Activity: Not Asked   Other Topics Concern  . None   Social History Narrative   Past Surgical History  Procedure Laterality Date  . Retinal detachment surgery  9/13   Past Medical History  Diagnosis Date    . Cervical facet syndrome   . Enthesopathy of hip region   . Synovitis and tenosynovitis   . Disorders of sacrum   . Sciatic neuritis   . Depression   . Hypertension   . Vision abnormalities   . Headache    BP 147/97 mmHg  Pulse 77  Resp 12  SpO2 96%  Opioid Risk Score:   Fall Risk Score:  `1  Depression screen PHQ 2/9  Depression screen [facility] 2/9 05/23/2015 03/20/2015 02/21/2015 11/09/2014 09/13/2014 05/12/2014  Decreased Interest 0 0 1 0 3 3  Down, Depressed, Hopeless 0 0 0 0 0 0  PHQ - 2 Score 0 0 1 0 3 3  Altered sleeping 0 - - - - 0  Tired, decreased energy 3 - - - - 3  Change in appetite 3 - - - - 3  Feeling bad or failure about yourself  0 - - - - 0  Trouble concentrating 0 - - - - 0  Moving slowly or fidgety/restless 0 - - - - 0  Suicidal thoughts 0 - - - - 0  PHQ-9 Score 6 - - - - 9    Review of Systems     Objective:   Physical Exam  Physical Exam  The patient is alert and appropriate. Pupils equal round and reactive to light. Extraocular eye movements intact. Heart is regular chest is clear and abdomen is soft and nontender. Right shoulder limited with flexion, and abduction---minimal pian. Low back is somewhat tender still around the operative site and inferiorly. She has pain with palpation to the right PSIS area as well as the left--once again---it may be better. Strength is generally intact at 5/ 5 out of 5 in both legs. Reflexes appear symmetrical 2+ bilaterally. Sensory exam is normal in both legs. Strength 5/5 in UE's. Romberg + but she is able to correct. Cannot walk in tandem---loses balance easily. [facility] coordination much improved left arm and leg today. No nystagmus. Cognitively she is alert and appropriate. Behaviorally she is intact. Gait is normal.  Psych: pt appears a little flat,depressed    Assessment & Plan:   ASSESSMENT:  1. Lumbar post laminectomy syndrome.  2. Left leg discrepancy.  3. New onset of back pain, now affecting left more than  right. Presentation and xrays consistent with listhesis of L4 on L5 and facet syndrome  4. New left limb, truncal ataxia. MRI negative for acute changes---showed a lot of white matter changes, ?right frontal chronic/subacute infarct?---neurology does not think there was an event. She has improved with vestibular therapy/balance training  5. Adhesive Capsulitis right shoulder 6. Opioid induced constipation   PLAN:  1. Gabapentin for neuropathic pain.  2. Percocet 10/325 one p.o. q.6 h. p.r.n., #90  3. Continue fentanyl at 70mcg/hr as this seems to be working well for her.. Second rx'es were given of narcotics for next month. Consider decrease to 37.73mcg potentially this year.  4. Therapy Right shoulder 5. Trial of movantik 12.5mg  daily. Samples provided.    She will follow up with me in about 2 months.15 minutes of face to face patient care time were spent during this visit. All questions were encouraged and answered

## 2015-07-25 NOTE — Patient Instructions (Signed)
  PLEASE CALL ME WITH ANY PROBLEMS OR QUESTIONS (#[phone]).      

## 2015-07-26 DIAGNOSIS — M7501 Adhesive capsulitis of right shoulder: Secondary | ICD-10-CM | POA: Diagnosis not present

## 2015-07-26 DIAGNOSIS — M25511 Pain in right shoulder: Secondary | ICD-10-CM | POA: Diagnosis not present

## 2015-07-26 DIAGNOSIS — M6281 Muscle weakness (generalized): Secondary | ICD-10-CM | POA: Diagnosis not present

## 2015-07-27 ENCOUNTER — Other Ambulatory Visit: Payer: Self-pay | Admitting: Physical Medicine & Rehabilitation

## 2015-07-31 DIAGNOSIS — M25511 Pain in right shoulder: Secondary | ICD-10-CM | POA: Diagnosis not present

## 2015-07-31 DIAGNOSIS — M6281 Muscle weakness (generalized): Secondary | ICD-10-CM | POA: Diagnosis not present

## 2015-07-31 DIAGNOSIS — M7501 Adhesive capsulitis of right shoulder: Secondary | ICD-10-CM | POA: Diagnosis not present

## 2015-08-02 DIAGNOSIS — M6281 Muscle weakness (generalized): Secondary | ICD-10-CM | POA: Diagnosis not present

## 2015-08-02 DIAGNOSIS — M25511 Pain in right shoulder: Secondary | ICD-10-CM | POA: Diagnosis not present

## 2015-08-02 DIAGNOSIS — M7501 Adhesive capsulitis of right shoulder: Secondary | ICD-10-CM | POA: Diagnosis not present

## 2015-08-02 LAB — TOXASSURE SELECT,+ANTIDEPR,UR: PDF: 0

## 2015-08-07 DIAGNOSIS — M6281 Muscle weakness (generalized): Secondary | ICD-10-CM | POA: Diagnosis not present

## 2015-08-07 DIAGNOSIS — M25511 Pain in right shoulder: Secondary | ICD-10-CM | POA: Diagnosis not present

## 2015-08-07 DIAGNOSIS — M7501 Adhesive capsulitis of right shoulder: Secondary | ICD-10-CM | POA: Diagnosis not present

## 2015-08-09 ENCOUNTER — Telehealth: Payer: Self-pay | Admitting: Neurology

## 2015-08-09 DIAGNOSIS — M7501 Adhesive capsulitis of right shoulder: Secondary | ICD-10-CM | POA: Diagnosis not present

## 2015-08-09 DIAGNOSIS — M25511 Pain in right shoulder: Secondary | ICD-10-CM | POA: Diagnosis not present

## 2015-08-09 DIAGNOSIS — M6281 Muscle weakness (generalized): Secondary | ICD-10-CM | POA: Diagnosis not present

## 2015-08-09 NOTE — Telephone Encounter (Signed)
LFt vm for patient that she would need to seek her PCP for the jerking to get a referral.

## 2015-08-09 NOTE — Telephone Encounter (Signed)
Pt called said she is having jerking in the middle of the night while sleeping for the past couple of month.  She said it wakes her partner up. Operator did not schedule appt since this is not a stroke problem. Operator explained to pt Dr Leonie Man might refer her to another provider in the practice for this condition since it is not stroke. Please call and let her know who she will see

## 2015-08-10 NOTE — Telephone Encounter (Signed)
If patient calls back she needs to schedule an earlier appt with Dr.Sethi at his next available.  LFt vm for patient to schedule an earlier appt with Dr. Leonie Man. Pt has next follow up in 11/2015.

## 2015-08-15 NOTE — Progress Notes (Signed)
Urine drug screen for this encounter is consistent for prescribed medication 

## 2015-08-31 ENCOUNTER — Other Ambulatory Visit: Payer: Self-pay | Admitting: Physical Medicine & Rehabilitation

## 2015-08-31 NOTE — Telephone Encounter (Signed)
I do not see recent documentation on this med. Please advise on refill.

## 2015-09-03 ENCOUNTER — Encounter: Payer: Self-pay | Admitting: Physical Medicine & Rehabilitation

## 2015-09-04 ENCOUNTER — Encounter: Payer: Self-pay | Admitting: Physical Medicine & Rehabilitation

## 2015-09-04 NOTE — Telephone Encounter (Signed)
Patient is calling to get a refill on Duloxetine.  Please call her at [phone] when this is done.

## 2015-09-04 NOTE — Telephone Encounter (Signed)
Refilled and pt notified

## 2015-09-05 DIAGNOSIS — H353131 Nonexudative age-related macular degeneration, bilateral, early dry stage: Secondary | ICD-10-CM | POA: Diagnosis not present

## 2015-09-05 DIAGNOSIS — H35371 Puckering of macula, right eye: Secondary | ICD-10-CM | POA: Diagnosis not present

## 2015-09-05 DIAGNOSIS — H40023 Open angle with borderline findings, high risk, bilateral: Secondary | ICD-10-CM | POA: Diagnosis not present

## 2015-09-05 DIAGNOSIS — H43813 Vitreous degeneration, bilateral: Secondary | ICD-10-CM | POA: Diagnosis not present

## 2015-09-19 ENCOUNTER — Encounter: Payer: Self-pay | Admitting: Physical Medicine & Rehabilitation

## 2015-09-19 ENCOUNTER — Encounter: Payer: PPO | Attending: Physical Medicine and Rehabilitation | Admitting: Physical Medicine & Rehabilitation

## 2015-09-19 VITALS — BP 118/75 | HR 82

## 2015-09-19 DIAGNOSIS — G253 Myoclonus: Secondary | ICD-10-CM | POA: Insufficient documentation

## 2015-09-19 DIAGNOSIS — M961 Postlaminectomy syndrome, not elsewhere classified: Secondary | ICD-10-CM | POA: Diagnosis not present

## 2015-09-19 DIAGNOSIS — R269 Unspecified abnormalities of gait and mobility: Secondary | ICD-10-CM

## 2015-09-19 DIAGNOSIS — M2170 Unequal limb length (acquired), unspecified site: Secondary | ICD-10-CM | POA: Diagnosis not present

## 2015-09-19 DIAGNOSIS — M47816 Spondylosis without myelopathy or radiculopathy, lumbar region: Secondary | ICD-10-CM | POA: Diagnosis not present

## 2015-09-19 DIAGNOSIS — M533 Sacrococcygeal disorders, not elsewhere classified: Secondary | ICD-10-CM | POA: Diagnosis not present

## 2015-09-19 HISTORY — DX: Myoclonus: G25.3

## 2015-09-19 MED ORDER — FENTANYL 50 MCG/HR TD PT72: 50.0000 ug | MEDICATED_PATCH | TRANSDERMAL | 0 refills | Status: DC

## 2015-09-19 MED ORDER — OXYCODONE-ACETAMINOPHEN 10-325 MG PO TABS: 1.0000 | ORAL_TABLET | Freq: Three times a day (TID) | ORAL | 0 refills | Status: DC | PRN

## 2015-09-19 NOTE — Patient Instructions (Signed)
PLEASE CALL ME WITH ANY PROBLEMS OR QUESTIONS ([phone])  

## 2015-09-19 NOTE — Progress Notes (Signed)
Subjective:    Patient ID: Alexandria Mclaughlin, female    DOB: May 12, 1940, 75 y.o.   MRN: [id number]  HPI   Alexandria Mclaughlin is here in follow up of her chronic pain. Her pain levels have been fairly stable. She has noticed increased jerking while she's falling asleep and actually when she's asleep. Symptoms have been happening for about 2 months and gradually have increased. She doesn't wake up when they take place unless she's just falling asleep. She feels rested when she awakens. The episodes awaken her boyfriend.      Pain Inventory Average Pain 4 Pain Right Now 4 My pain is intermittent, sharp and stabbing  In the last 24 hours, has pain interfered with the following? General activity 3 Relation with others 4 Enjoyment of life 4 What TIME of day is your pain at its worst? daytime Sleep (in general) Fair  Pain is worse with: sitting and standing Pain improves with: pacing activities and medication Relief from Meds: 6  Mobility Do you have any goals in this area?  no  Function Do you have any goals in this area?  no  Neuro/Psych No problems in this area  Prior Studies Any changes since last visit?  no  Physicians involved in your care Any changes since last visit?  no   Family History  Problem Relation Age of Onset  . Diabetes Mother   . Heart disease Mother   . Stroke Mother   . Heart disease Father   . Heart disease Brother    Social History   Social History  . Marital status: Widowed    Spouse name: N/A  . Number of children: N/A  . Years of education: N/A   Social History Main Topics  . Smoking status: Former Smoker    Packs/day: 0.50    Years: 5.00    Types: Cigarettes    Quit date: 03/15/1968  . Smokeless tobacco: Never Used  . Alcohol use 0.6 oz/week    1 Glasses of wine per week     Comment: occasional glass of wine  . Drug use: No  . Sexual activity: Not Asked   Other Topics Concern  . None   Social History Narrative  . None   Past Surgical  History:  Procedure Laterality Date  . RETINAL DETACHMENT SURGERY  9/13   Past Medical History:  Diagnosis Date  . Cervical facet syndrome   . Depression   . Disorders of sacrum   . Enthesopathy of hip region   . Headache   . Hypertension   . Sciatic neuritis   . Synovitis and tenosynovitis   . Vision abnormalities    BP 118/75   Pulse 82   SpO2 92%   Opioid Risk Score:   Fall Risk Score:  `1  Depression screen PHQ 2/9  Depression screen [facility] 2/9 05/23/2015 03/20/2015 02/21/2015 11/09/2014 09/13/2014 05/12/2014  Decreased Interest 0 0 1 0 3 3  Down, Depressed, Hopeless 0 0 0 0 0 0  PHQ - 2 Score 0 0 1 0 3 3  Altered sleeping 0 - - - - 0  Tired, decreased energy 3 - - - - 3  Change in appetite 3 - - - - 3  Feeling bad or failure about yourself  0 - - - - 0  Trouble concentrating 0 - - - - 0  Moving slowly or fidgety/restless 0 - - - - 0  Suicidal thoughts 0 - - - - 0  PHQ-9  Score 6 - - - - 9    Review of Systems  Constitutional: Negative.   HENT: Negative.   Eyes: Negative.   Respiratory: Negative.   Cardiovascular: Negative.   Gastrointestinal: Negative.   Endocrine: Negative.   Genitourinary: Negative.   Musculoskeletal: Positive for back pain.  Skin: Negative.   Allergic/Immunologic: Negative.   Neurological: Negative.   Hematological: Negative.   Psychiatric/Behavioral: Negative.        Objective:   Physical Exam  Physical Exam  The patient is alert and appropriate. Pupils equal round and reactive to light. Extraocular eye movements intact. Heart is regular chest is clear and abdomen is soft and nontender. Right shoulder limited with flexion, and abduction---minimal pian. Low back is somewhat tender still around the operative site and inferiorly. She has pain with palpation to the right PSIS area as well as the left-Strength is generally intact at 5/ 5 out of 5 in both legs. Reflexes appear symmetrical 2+ bilaterally. Sensory exam is normal in both legs.  Strength 5/5 in UE's. Romberg + but she is able to correct. Cannot walk in tandem---loses balance easily. [facility] coordination much improved left arm and leg today. No nystagmus. Cognitively she is alert and appropriate. Behaviorally she is intact. Gait is normal. No tremors or abnormal tone noted. Psych: pt appears bright and appropriate.    Assessment & Plan:   ASSESSMENT:  1. Lumbar post laminectomy syndrome.  2. Left leg discrepancy.  3. New onset of back pain, now affecting left more than right. Presentation and xrays consistent with listhesis of L4 on L5 and facet syndrome  4. Hx of small vessel infarct 5. Adhesive Capsulitis right shoulder 6. Opioid induced constipation--improved 7. Myoclonus with sleep   PLAN:  1. Gabapentin for neuropathic pain.  2. Percocet 10/325 one p.o. q.6 h. p.r.n., #90  3. Continue fentanyl at 39mcg/hr as this seems to be working well for her.. Second rx'es were given of narcotics for next month. Consider decrease to 37.66mcg potentially this year.  4.  Will check CMET, MG++, CBC to see if there is a metabolic or hematological source for her myoclonic jerks.  Depending upon evolution will proceed with further work up/treatment.  5. Continue movantik 12.5mg  daily. Marland Kitchen    She will follow up with me in about 1 months.15 minutes of face to face patient care time were spent during this visit. All questions were encouraged and answered

## 2015-09-20 ENCOUNTER — Other Ambulatory Visit: Payer: Self-pay | Admitting: Physical Medicine & Rehabilitation

## 2015-09-20 LAB — COMPREHENSIVE METABOLIC PANEL
ALBUMIN: 4.6 g/dL (ref 3.5–4.8)
ALK PHOS: 74 IU/L (ref 39–117)
ALT: 15 IU/L (ref 0–32)
AST: 17 IU/L (ref 0–40)
Albumin/Globulin Ratio: 1.9 (ref 1.2–2.2)
BILIRUBIN TOTAL: 0.7 mg/dL (ref 0.0–1.2)
BUN / CREAT RATIO: 36 — AB (ref 12–28)
BUN: 20 mg/dL (ref 8–27)
CHLORIDE: 101 mmol/L (ref 96–106)
CO2: 28 mmol/L (ref 18–29)
Calcium: 9.9 mg/dL (ref 8.7–10.3)
Creatinine, Ser: 0.56 mg/dL — ABNORMAL LOW (ref 0.57–1.00)
GFR calc Af Amer: 105 mL/min/{1.73_m2} (ref >59)
GFR calc non Af Amer: 92 mL/min/{1.73_m2} (ref >59)
GLOBULIN, TOTAL: 2.4 g/dL (ref 1.5–4.5)
GLUCOSE: 111 mg/dL — AB (ref 65–99)
Potassium: 4.6 mmol/L (ref 3.5–5.2)
SODIUM: 139 mmol/L (ref 134–144)
Total Protein: 7 g/dL (ref 6.0–8.5)

## 2015-09-20 LAB — CBC
HEMATOCRIT: 40.9 % (ref 34.0–46.6)
Hemoglobin: 14.2 g/dL (ref 11.1–15.9)
MCH: 30.1 pg (ref 26.6–33.0)
MCHC: 34.7 g/dL (ref 31.5–35.7)
MCV: 87 fL (ref 79–97)
PLATELETS: 204 10*3/uL (ref 150–379)
RBC: 4.71 x10E6/uL (ref 3.77–5.28)
RDW: 13.1 % (ref 12.3–15.4)
WBC: 7.1 10*3/uL (ref 3.4–10.8)

## 2015-09-20 LAB — MAGNESIUM: Magnesium: 2.2 mg/dL (ref 1.6–2.3)

## 2015-09-27 NOTE — Telephone Encounter (Signed)
I called Alexandria Mclaughlin to inform her of lab results---encouraged increased fluid intake.

## 2015-10-22 ENCOUNTER — Encounter: Payer: Self-pay | Admitting: Physical Medicine & Rehabilitation

## 2015-10-22 ENCOUNTER — Encounter: Payer: PPO | Attending: Physical Medicine and Rehabilitation | Admitting: Physical Medicine & Rehabilitation

## 2015-10-22 VITALS — BP 112/76 | HR 66 | Resp 16

## 2015-10-22 DIAGNOSIS — G253 Myoclonus: Secondary | ICD-10-CM | POA: Diagnosis not present

## 2015-10-22 DIAGNOSIS — M533 Sacrococcygeal disorders, not elsewhere classified: Secondary | ICD-10-CM

## 2015-10-22 DIAGNOSIS — M2170 Unequal limb length (acquired), unspecified site: Secondary | ICD-10-CM | POA: Diagnosis not present

## 2015-10-22 DIAGNOSIS — M961 Postlaminectomy syndrome, not elsewhere classified: Secondary | ICD-10-CM | POA: Diagnosis not present

## 2015-10-22 DIAGNOSIS — M47816 Spondylosis without myelopathy or radiculopathy, lumbar region: Secondary | ICD-10-CM | POA: Diagnosis not present

## 2015-10-22 DIAGNOSIS — R269 Unspecified abnormalities of gait and mobility: Secondary | ICD-10-CM

## 2015-10-22 MED ORDER — FENTANYL 50 MCG/HR TD PT72: 50.0000 ug | MEDICATED_PATCH | TRANSDERMAL | 0 refills | Status: DC

## 2015-10-22 MED ORDER — OXYCODONE-ACETAMINOPHEN 10-325 MG PO TABS: 1.0000 | ORAL_TABLET | Freq: Three times a day (TID) | ORAL | 0 refills | Status: DC | PRN

## 2015-10-22 MED ORDER — CLONAZEPAM 0.5 MG PO TABS: 0.5000 mg | ORAL_TABLET | Freq: Every day | ORAL | 2 refills | Status: DC

## 2015-10-22 NOTE — Patient Instructions (Signed)
PLEASE CALL ME WITH ANY PROBLEMS OR QUESTIONS ([phone])  

## 2015-10-22 NOTE — Progress Notes (Signed)
Subjective:    Patient ID: Alexandria Mclaughlin, female    DOB: 11-09-1940, 75 y.o.   MRN: [id number]  HPI   Gretchan is here in follow up of her chronic pain and balance disorder. Her lab work was fairly unremarkable. She is still having the "jerks" as she's falling asleep and sometimes while she is asleep. She doesn't notice them when she's asleep but her partner does. Her pain levels are stable.   She continues on percocet for baseline pain control. She is also taking fentanyl patch.   Pain Inventory Average Pain 4 Pain Right Now 4 My pain is sharp and stabbing  In the last 24 hours, has pain interfered with the following? General activity 4 Relation with others 4 Enjoyment of life 5 What TIME of day is your pain at its worst? daytime Sleep (in general) Fair  Pain is worse with: sitting and standing Pain improves with: heat/ice and medication Relief from Meds: 6  Mobility Do you have any goals in this area?  no  Function Do you have any goals in this area?  no  Neuro/Psych No problems in this area  Prior Studies Any changes since last visit?  no  Physicians involved in your care Any changes since last visit?  no   Family History  Problem Relation Age of Onset  . Diabetes Mother   . Heart disease Mother   . Stroke Mother   . Heart disease Father   . Heart disease Brother    Social History   Social History  . Marital status: Widowed    Spouse name: N/A  . Number of children: N/A  . Years of education: N/A   Social History Main Topics  . Smoking status: Former Smoker    Packs/day: 0.50    Years: 5.00    Types: Cigarettes    Quit date: 03/15/1968  . Smokeless tobacco: Never Used  . Alcohol use 0.6 oz/week    1 Glasses of wine per week     Comment: occasional glass of wine  . Drug use: No  . Sexual activity: Not Asked   Other Topics Concern  . None   Social History Narrative  . None   Past Surgical History:  Procedure Laterality Date  . RETINAL  DETACHMENT SURGERY  9/13   Past Medical History:  Diagnosis Date  . Cervical facet syndrome   . Depression   . Disorders of sacrum   . Enthesopathy of hip region   . Headache   . Hypertension   . Sciatic neuritis   . Synovitis and tenosynovitis   . Vision abnormalities    BP 112/76 (BP Location: Left Arm, Patient Position: Sitting, Cuff Size: Normal)   Pulse 66   Resp 16   SpO2 96%   Opioid Risk Score:   Fall Risk Score:  `1  Depression screen PHQ 2/9  Depression screen [facility] 2/9 10/22/2015 05/23/2015 03/20/2015 02/21/2015 11/09/2014 09/13/2014 05/12/2014  Decreased Interest 0 0 0 1 0 3 3  Down, Depressed, Hopeless 0 0 0 0 0 0 0  PHQ - 2 Score 0 0 0 1 0 3 3  Altered sleeping - 0 - - - - 0  Tired, decreased energy - 3 - - - - 3  Change in appetite - 3 - - - - 3  Feeling bad or failure about yourself  - 0 - - - - 0  Trouble concentrating - 0 - - - - 0  Moving slowly or  fidgety/restless - 0 - - - - 0  Suicidal thoughts - 0 - - - - 0  PHQ-9 Score - 6 - - - - 9    Review of Systems  All other systems reviewed and are negative. Physical Exam  The patient is alert and appropriate. Pupils equal round and reactive to light. Extraocular eye movements intact. Heart is regular chest is clear and abdomen is soft and nontender. Right shoulder limited with flexion, and abduction---minimal pian. Low back is somewhat tender still around the operative site and inferiorly. She has pain with palpation to the right PSIS area as well as the left-Strength is generally intact at 5/ 5 out of 5 in both legs. Reflexes appear symmetrical 2+ bilaterally. Sensory exam is normal in both legs. Strength 5/5 in UE's. Romberg + but she is able to correct.  Marland Kitchen [facility] coordination improved left arm and leg today. +Romberg however. Tandem gait still affected. Cognitively she is alert and appropriate. Behaviorally she is intact. Gait is normal. No tremors or abnormal tone noted. Psych: pt appears bright and appropriate       Objective:   Physical Exam   1. Lumbar post laminectomy syndrome.  2. Left leg discrepancy.  3. New onset of back pain, now affecting left more than right. Presentation and xrays consistent with listhesis of L4 on L5 and facet syndrome  4. Hx of small vessel infarct 5. Adhesive Capsulitis right shoulder 6. Opioid induced constipation--improved 7. Myoclonus with sleep   PLAN:  1. Gabapentin for neuropathic pain.  2. Percocet 10/325 one p.o. q.6 h. p.r.n., #90  3. Continue fentanyl at 50mcg/hr as this seems to be working well for her.. Second rx'es were given of narcotics for next month. Consider decrease to 37.75mcg potentially this year.  4.  Will add low dose klonopin 0.5mg   to assist with myoclonus with sleep. May ultimately taper trazodone 5. Continue movantik 12.5mg  daily. Marland Kitchen   She will follow up with me in about 2 months.15 minutes of face to face patient care time were spent during this visit. All questions were encouraged and answered

## 2015-10-31 DIAGNOSIS — G894 Chronic pain syndrome: Secondary | ICD-10-CM | POA: Diagnosis not present

## 2015-10-31 DIAGNOSIS — E782 Mixed hyperlipidemia: Secondary | ICD-10-CM | POA: Diagnosis not present

## 2015-10-31 DIAGNOSIS — I10 Essential (primary) hypertension: Secondary | ICD-10-CM | POA: Diagnosis not present

## 2015-10-31 DIAGNOSIS — E0590 Thyrotoxicosis, unspecified without thyrotoxic crisis or storm: Secondary | ICD-10-CM | POA: Diagnosis not present

## 2015-11-05 DIAGNOSIS — Z23 Encounter for immunization: Secondary | ICD-10-CM | POA: Diagnosis not present

## 2015-11-05 DIAGNOSIS — I10 Essential (primary) hypertension: Secondary | ICD-10-CM | POA: Diagnosis not present

## 2015-11-05 DIAGNOSIS — E782 Mixed hyperlipidemia: Secondary | ICD-10-CM | POA: Diagnosis not present

## 2015-11-05 DIAGNOSIS — G894 Chronic pain syndrome: Secondary | ICD-10-CM | POA: Diagnosis not present

## 2015-12-04 ENCOUNTER — Encounter: Payer: Self-pay | Admitting: Neurology

## 2015-12-04 ENCOUNTER — Ambulatory Visit (INDEPENDENT_AMBULATORY_CARE_PROVIDER_SITE_OTHER): Payer: PPO | Admitting: Neurology

## 2015-12-04 VITALS — BP 105/70 | HR 76 | Ht 64.0 in | Wt 134.0 lb

## 2015-12-04 DIAGNOSIS — R269 Unspecified abnormalities of gait and mobility: Secondary | ICD-10-CM | POA: Diagnosis not present

## 2015-12-04 NOTE — Progress Notes (Signed)
NEUROLOGY CLINIC FOLLOW UP VISIT NOTE  NAME: Alexandria Mclaughlin DOB: 1941/02/14 REFERRING PHYSICIAN: Meredith Staggers, MD  I saw Alexandria Mclaughlin as a second opinion follow up in the neurovascular clinic today regarding  Chief Complaint  Patient presents with  . Follow-up    BALANCING PROBLEMS, AND VISION FOLLOW UP. Pts PCP evaluate her for jerking and prscribed her Klonopin  .  HPI: Dr Alexandria Mclaughlin consult note 02/06/2015 :  Alexandria Mclaughlin is a 75 y.o. female with PMH of HTN, HLD, BPPV, back pain s/p surgery who presents as a new patient for Imbalance, double vision, headache, memory loss.   Patient stated that due to back pain she had wobbly walking for a while and in the going PT and pain control with rehabilitation. However, for the last 2 months, she noticed her walking getting worse, more wobbly, imbalance, veering to the left on walking. However, there is no history of falls.   She also complains one episode of binocular double vision about 2 weeks ago, lasting 5 minutes, resolved without recurrence. Denies any other symptoms associate with double vision, no facial weakness, speech difficulty, dizziness, weakness or numbness.   She also complained of headache, off and on, however lately low-grade constant headache, different from her typical migraine headache. Taking Excedrin for headache occasionally.   She also complained of memory loss for the last one year, has to write down things otherwise will forget. Sometimes difficulty come up with words. However, still able to do all her ADLs, financially independent, no flooding or burning accidents, no loss in familiar environment.   She has been following with Dr. Naaman Mclaughlin, had MRI and MRA head showed no acute abnormality, no acute stroke, and an unremarkable MRA. However, her white matter small vessel disease seems much progressed since 3 years ago.  She had a history of a back pain, status post surgery 3, last surgery 4 years ago. She had constant back  pain, undergoing PT treatment as well as pain management with Percocet. She also has history of BPPV, undergoing PT maneuver training. History of hypertension, hyperlipidemia on quinapril and lovastatin. Denies any history of diabetes. As per daughter, she had snoring during sleep, and patient also admits she sometimes woke up with gasping for air. Never had a sleep study done before. Denies smoking, alcohol, illicit drugs.  Patient denies any recent stress, anxiety, or depression. Stated that her life is simple and stress free. Update 05/21/2015 : Alexandria Mclaughlin requested that she switch her neurological follow-up to me and hence am seeing her today instead of Dr. Erlinda Mclaughlin. She informs me that about 4 months ago. She noticed sudden onset of 4 horizontal diplopia while driving she was sitting 2 cars. This is essentially is still present and she has seen an eye doctor and is being considered for prisms. She has no double vision when she is reading or looking at near objects. This is binocular and disappears when she closes either eye. Roughly the same time she also noticed that her equilibrium and balance was off. She at times she may lean to one side and needs to hold on that she's had no falls. Her MRI scan of the brain done on December 17 which have personally reviewed shows extensive white matter changes similar particularly in the midline and paramedian pons and these appear to be increased compared with previous MRI from 2014. These could represent small vessel chronic ischemic changes though a tiny brainstem infarct may have appeared in not  captured as the MRI was performed more than a week after onset of her symptoms. Dr. Erlinda Mclaughlin had ordered a carotid ultrasound which was done on 01/2115 which have reviewed showed no significant extra stresses as well as transthoracic echo which showed normal ejection fraction without cardiac source of embolism. The patient states she is tolerating aspirin 81 mg well without bleeding but  only minor bruising. She had lipid profile checked a month ago and was satisfactory. Her blood pressure is well controlled and today it is 143/6 finer office. She has seen Dr. Brett Mclaughlin for evaluation for possible sleep apnea but she has not yet scheduled the polysomnogram study. She has no new complaints. Update 12/04/2015 : She returns for follow-up after last visit 6 months ago. She states is doing well she's had no major injuries or falls. She still feels her balance is quite poor and balance at most is marginal but she is careful. She does have a Physiological scientist at home and is working to improve her balance. She is starting aspirin well without bleeding or bruising. She states her blood pressure is well controlled and today it is 105/69. She's tolerating Mevacor well without muscle aches or pains. She also sees a pain medicine doctor in 2 months. She has no new complaints today. 14 system review of systems documented separately in the chart is positive only for back pain, balance problems, double vision and all other systems negative Past Medical History:  Diagnosis Date  . Cervical facet syndrome   . Depression   . Disorders of sacrum   . Enthesopathy of hip region   . Headache   . Hypertension   . Sciatic neuritis   . Synovitis and tenosynovitis   . Vision abnormalities    Past Surgical History:  Procedure Laterality Date  . RETINAL DETACHMENT SURGERY  9/13   Family History  Problem Relation Age of Onset  . Diabetes Mother   . Heart disease Mother   . Stroke Mother   . Heart disease Father   . Heart disease Brother    Current Outpatient Prescriptions  Medication Sig Dispense Refill  . aspirin 81 MG chewable tablet Chew 81 mg by mouth daily.    . clonazePAM (KLONOPIN) 0.5 MG tablet Take 1 tablet (0.5 mg total) by mouth at bedtime. 30 tablet 2  . DULoxetine (CYMBALTA) 30 MG capsule Take 1 capsule by mouth daily.    . fentaNYL (DURAGESIC - DOSED MCG/HR) 50 MCG/HR Place 1 patch (50  mcg total) onto the skin every 3 (three) days. 10 patch 0  . gabapentin (NEURONTIN) 300 MG capsule TAKE ONE CAPSULE BY MOUTH 3 TIMES A DAY AND 1 CAPSULE AT BEDTIME AS NEEDED 120 capsule 5  . lovastatin (MEVACOR) 40 MG tablet Take 40 mg by mouth daily.    Marland Kitchen oxyCODONE-acetaminophen (PERCOCET) 10-325 MG tablet Take 1 tablet by mouth every 8 (eight) hours as needed. 90 tablet 0  . quinapril (ACCUPRIL) 20 MG tablet Take 20 mg by mouth daily.     No current facility-administered medications for this visit.    No Known Allergies Social History   Social History  . Marital status: Widowed    Spouse name: N/A  . Number of children: N/A  . Years of education: N/A   Occupational History  . Not on file.   Social History Main Topics  . Smoking status: Former Smoker    Packs/day: 0.50    Years: 5.00    Types: Cigarettes    Quit  date: 03/15/1968  . Smokeless tobacco: Never Used  . Alcohol use 0.6 oz/week    1 Glasses of wine per week     Comment: occasional glass of wine  . Drug use: No  . Sexual activity: Not on file   Other Topics Concern  . Not on file   Social History Narrative  . No narrative on file    Review of Systems Full 14 system review of systems performed and notable only for those listed, all others are neg:   Dizziness, gait imbalance, diplopia, back pain and all other systems negative  Physical Exam  Vitals:   12/04/15 1143  BP: 105/70  Pulse: 76    General - Well nourished, well developed, in no apparent distress.  Ophthalmologic - fundi not visualized due to eye movement.  Cardiovascular - Regular rate and rhythm.  Musculoskeletal-no deformity Neck - supple, no nuchal rigidity . Skin few petechiae. No rash.  Distal pulses are well felt  Neurological Exam ;  Awake  Alert oriented x 3. Normal speech and language.eye movements full without nystagmus. No subjective diplopia on near gaze but only on distant gaze..fundi were not visualized. Vision acuity and  fields appear normal. Hearing is normal. Palatal movements are normal. Face symmetric. Tongue midline. Normal strength, tone, reflexes and coordination. Normal sensation. Gait cautious and slightly broad-based. Unable to walk tandem due to severe difficulty  I have personally reviewed the radiological images below and agree with the radiology interpretations.  06/18/11 MRI brain  No acute intracranial findings. Mild chronic microvascular ischemic change, possibly age related or secondary to hypertension.  01/31/15 MRI and MRA brain 1. No acute intracranial abnormality. 2. Progressive, moderate chronic small vessel ischemic disease. 3. Unremarkable head MRA aside from normal variant anatomy.  Lab Review January 2016 A1c 5.8, LDL 96, TSH 0.473    Assessment and Plan:   In summary, Alexandria Mclaughlin is a 75 y.o. female with PMH of  presents HTN, HLD, BPPV, back pain s/p surgery who developed gait imbalance, double vision, headache, memory loss 4 months ago possibly due to tiny brainstem infarct not visualized on MRI due to small vessel disease. Multiple vascular risk factors of hypertension, hyperlipidemia and cerebrovascular disease  I had a long discussion with the patient with regards to her gait imbalance and encouraged her to avoid sudden movements and walk slowly. She was encouraged to continue working with her personal trainer to improve her imbalance. She was given aspirin for stroke prevention and maintain strict control of hypertension with blood pressure goal below 130/90 and lipids with LDL cholesterol goal below 70 mg percent. She will   follow-up with her primary care physician only and no scheduled follow-up appointment with me is necessary. She voiced understanding.  No orders of the defined types were placed in this encounter.   No orders of the defined types were placed in this encounter.      Alexandria Contras, MD [facility] [address], [place],  [address] [phone]

## 2015-12-04 NOTE — Patient Instructions (Signed)
I had a long discussion with the patient with regards to her gait imbalance and encouraged her to avoid sudden movements and walk slowly. She was encouraged to continue working with her personal trainer to improve her imbalance. She was given aspirin for stroke prevention and maintain strict control of hypertension with blood pressure goal below 130/90 and lipids with LDL cholesterol goal below 70 mg percent. She will   follow-up with her primary care physician only and no scheduled follow-up appointment with me is necessary. She voiced understanding.

## 2015-12-07 ENCOUNTER — Encounter: Payer: PPO | Attending: Physical Medicine and Rehabilitation | Admitting: Registered Nurse

## 2015-12-07 ENCOUNTER — Encounter: Payer: Self-pay | Admitting: Registered Nurse

## 2015-12-07 VITALS — BP 110/71 | HR 68

## 2015-12-07 DIAGNOSIS — R269 Unspecified abnormalities of gait and mobility: Secondary | ICD-10-CM

## 2015-12-07 DIAGNOSIS — M961 Postlaminectomy syndrome, not elsewhere classified: Secondary | ICD-10-CM

## 2015-12-07 DIAGNOSIS — M533 Sacrococcygeal disorders, not elsewhere classified: Secondary | ICD-10-CM | POA: Diagnosis not present

## 2015-12-07 DIAGNOSIS — Z79899 Other long term (current) drug therapy: Secondary | ICD-10-CM

## 2015-12-07 DIAGNOSIS — Z5181 Encounter for therapeutic drug level monitoring: Secondary | ICD-10-CM

## 2015-12-07 DIAGNOSIS — M2170 Unequal limb length (acquired), unspecified site: Secondary | ICD-10-CM | POA: Diagnosis not present

## 2015-12-07 DIAGNOSIS — G253 Myoclonus: Secondary | ICD-10-CM | POA: Diagnosis not present

## 2015-12-07 DIAGNOSIS — G894 Chronic pain syndrome: Secondary | ICD-10-CM

## 2015-12-07 MED ORDER — FENTANYL 12 MCG/HR TD PT72: 12.5000 ug | MEDICATED_PATCH | TRANSDERMAL | 0 refills | Status: DC

## 2015-12-07 MED ORDER — FENTANYL 50 MCG/HR TD PT72: 50.0000 ug | MEDICATED_PATCH | TRANSDERMAL | 0 refills | Status: DC

## 2015-12-07 MED ORDER — FENTANYL 25 MCG/HR TD PT72: 25.0000 ug | MEDICATED_PATCH | TRANSDERMAL | 0 refills | Status: DC

## 2015-12-07 MED ORDER — OXYCODONE-ACETAMINOPHEN 10-325 MG PO TABS: 1.0000 | ORAL_TABLET | Freq: Three times a day (TID) | ORAL | 0 refills | Status: DC | PRN

## 2015-12-07 NOTE — Progress Notes (Signed)
Subjective:    Patient ID: Alexandria Mclaughlin, female    DOB: 15-May-1940, 75 y.o.   MRN: [id number]  HPI: [name] is a 75 year old female who returns for follow up for chronic pain and medication refill. She states her pain is located  in her right buttock ( sacral tenderness). She rates her pain 3.  Her current exercise regime is working with a Physiological scientist weekly and attending [employer] twice a week. Also states the Klonopin has helped with the jerks at night.  Dr. Naaman Plummer note reviewed, we will began to taper the Fentanyl in November, she verbalizes understanding. Instructed to call office with any questions and concerns.   Pain Inventory Average Pain 4 Pain Right Now 3 My pain is intermittent, sharp and stabbing  In the last 24 hours, has pain interfered with the following? General activity 3 Relation with others 2 Enjoyment of life 4 What TIME of day is your pain at its worst? daytime Sleep (in general) Good  Pain is worse with: sitting and standing Pain improves with: therapy/exercise and medication Relief from Meds: 5  Mobility walk without assistance Do you have any goals in this area?  no  Function retired Do you have any goals in this area?  no  Neuro/Psych No problems in this area  Prior Studies Any changes since last visit?  no  Physicians involved in your care Any changes since last visit?  no   Family History  Problem Relation Age of Onset  . Diabetes Mother   . Heart disease Mother   . Stroke Mother   . Heart disease Father   . Heart disease Brother    Social History   Social History  . Marital status: Widowed    Spouse name: N/A  . Number of children: N/A  . Years of education: N/A   Social History Main Topics  . Smoking status: Former Smoker    Packs/day: 0.50    Years: 5.00    Types: Cigarettes    Quit date: 03/15/1968  . Smokeless tobacco: Never Used  . Alcohol use 0.6 oz/week    1 Glasses of wine per week   Comment: occasional glass of wine  . Drug use: No  . Sexual activity: Not Asked   Other Topics Concern  . None   Social History Narrative  . None   Past Surgical History:  Procedure Laterality Date  . RETINAL DETACHMENT SURGERY  9/13   Past Medical History:  Diagnosis Date  . Cervical facet syndrome   . Depression   . Disorders of sacrum   . Enthesopathy of hip region   . Headache   . Hypertension   . Sciatic neuritis   . Synovitis and tenosynovitis   . Vision abnormalities    BP 110/71   Pulse 68   SpO2 97%   Opioid Risk Score:   Fall Risk Score:  `1  Depression screen PHQ 2/9  Depression screen [facility] 2/9 10/22/2015 05/23/2015 03/20/2015 02/21/2015 11/09/2014 09/13/2014 05/12/2014  Decreased Interest 0 0 0 1 0 3 3  Down, Depressed, Hopeless 0 0 0 0 0 0 0  PHQ - 2 Score 0 0 0 1 0 3 3  Altered sleeping - 0 - - - - 0  Tired, decreased energy - 3 - - - - 3  Change in appetite - 3 - - - - 3  Feeling bad or failure about yourself  - 0 - - - -  0  Trouble concentrating - 0 - - - - 0  Moving slowly or fidgety/restless - 0 - - - - 0  Suicidal thoughts - 0 - - - - 0  PHQ-9 Score - 6 - - - - 9    Review of Systems  Constitutional: Negative.   HENT: Negative.   Eyes: Negative.   Respiratory: Negative.   Cardiovascular: Negative.   Gastrointestinal: Negative.   Endocrine: Negative.   Genitourinary: Negative.   Musculoskeletal: Positive for back pain.  Skin: Negative.   Allergic/Immunologic: Negative.   Neurological: Negative.   Hematological: Negative.   Psychiatric/Behavioral: Negative.        Objective:   Physical Exam  Constitutional: She is oriented to person, place, and time. She appears well-developed and well-nourished.  HENT:  Head: Normocephalic and atraumatic.  Neck: Normal range of motion. Neck supple.  Musculoskeletal:  Normal Muscle Bulk and Muscle Testing Reveals: Upper Extremities: Full ROM and Muscle Strength 5/5 Sacral Tenderness( Right) Lower  Extremities: Full ROM and Muscle Strength 5/5 Arises from Table with ease Narrow Based Gait   Neurological: She is alert and oriented to person, place, and time.  Skin: Skin is warm and dry.  Psychiatric: She has a normal mood and affect.  Nursing note and vitals reviewed.         Assessment & Plan:  1. Post laminectomy syndrome with right buttock pain: Pain controlled on Fentanyl and Percocet.  Refilled:Fentanyl patch 50 mcg for October in November Decreased to 37.5 mcg # 10- one patch every 3 days and Percocet 10/325 mg # 100--use one every 6 hours as needed. Second script of Fentanyl given for the following month.  We will continue the opioid monitoring program, this consists of regular clinic visits, examinations, urine drug screen, pill counts as well as use of [place] Controlled Substance Reporting System. 2. Gait Disorder: Continue HEP / Neurology Following 3. Myoclonus with sleep: Continue Klonopin  20 minutes of face to face patient care time was spent during this visit. All questions were encouraged and answered.   F/U in 2 months

## 2015-12-11 ENCOUNTER — Ambulatory Visit: Payer: PPO | Admitting: Physical Medicine & Rehabilitation

## 2015-12-12 ENCOUNTER — Telehealth: Payer: Self-pay | Admitting: Physical Medicine & Rehabilitation

## 2015-12-12 MED ORDER — GABAPENTIN 300 MG PO CAPS: ORAL_CAPSULE | ORAL | 5 refills | Status: DC

## 2015-12-12 NOTE — Telephone Encounter (Signed)
Claiborne Billings with [facility] is calling to get a refill on Gabapentin.  Please call [phone].

## 2015-12-31 ENCOUNTER — Other Ambulatory Visit: Payer: Self-pay | Admitting: Physical Medicine & Rehabilitation

## 2016-01-13 ENCOUNTER — Other Ambulatory Visit: Payer: Self-pay | Admitting: Physical Medicine & Rehabilitation

## 2016-01-14 NOTE — Telephone Encounter (Signed)
rec'd electronic request for trazadone refill. Your last clinic note suggested tapering off this medication. Please advise

## 2016-01-24 ENCOUNTER — Other Ambulatory Visit: Payer: Self-pay | Admitting: *Deleted

## 2016-01-24 DIAGNOSIS — G253 Myoclonus: Secondary | ICD-10-CM

## 2016-01-24 MED ORDER — CLONAZEPAM 0.5 MG PO TABS
0.5000 mg | ORAL_TABLET | Freq: Every day | ORAL | 2 refills | Status: DC
Start: 2016-01-24 — End: 2016-04-25

## 2016-01-29 ENCOUNTER — Encounter: Payer: Self-pay | Admitting: Registered Nurse

## 2016-01-29 ENCOUNTER — Encounter: Payer: PPO | Attending: Physical Medicine and Rehabilitation | Admitting: Registered Nurse

## 2016-01-29 VITALS — BP 120/83 | HR 71

## 2016-01-29 DIAGNOSIS — Z5181 Encounter for therapeutic drug level monitoring: Secondary | ICD-10-CM | POA: Diagnosis not present

## 2016-01-29 DIAGNOSIS — Z79899 Other long term (current) drug therapy: Secondary | ICD-10-CM | POA: Diagnosis not present

## 2016-01-29 DIAGNOSIS — M961 Postlaminectomy syndrome, not elsewhere classified: Secondary | ICD-10-CM

## 2016-01-29 DIAGNOSIS — M533 Sacrococcygeal disorders, not elsewhere classified: Secondary | ICD-10-CM | POA: Insufficient documentation

## 2016-01-29 DIAGNOSIS — M2170 Unequal limb length (acquired), unspecified site: Secondary | ICD-10-CM | POA: Insufficient documentation

## 2016-01-29 DIAGNOSIS — G894 Chronic pain syndrome: Secondary | ICD-10-CM | POA: Diagnosis not present

## 2016-01-29 DIAGNOSIS — M792 Neuralgia and neuritis, unspecified: Secondary | ICD-10-CM

## 2016-01-29 MED ORDER — OXYCODONE-ACETAMINOPHEN 10-325 MG PO TABS: 1.0000 | ORAL_TABLET | Freq: Three times a day (TID) | ORAL | 0 refills | Status: DC | PRN

## 2016-01-29 MED ORDER — FENTANYL 12 MCG/HR TD PT72: 12.5000 ug | MEDICATED_PATCH | TRANSDERMAL | 0 refills | Status: DC

## 2016-01-29 MED ORDER — FENTANYL 25 MCG/HR TD PT72: 25.0000 ug | MEDICATED_PATCH | TRANSDERMAL | 0 refills | Status: DC

## 2016-01-29 NOTE — Progress Notes (Signed)
Subjective:    Patient ID: Alexandria Mclaughlin, female    DOB: 06-28-1940, 75 y.o.   MRN: [id number]  HPI: [name] is a 75 year old female who returns for follow up for chronic pain and medication refill. She states her pain is located  in her right buttock ( sacral tenderness). She rates her pain 4. Her current exercise regime is working with a Physiological scientist weekly and attending [employer] twice a week. [name] will start the Fentanyl 37.5 in a few weeks, she still has Fentanyl 50 mcg patches (7). She verbalizes understanding.   Pain Inventory Average Pain 4 Pain Right Now 4 My pain is intermittent, sharp and stabbing  In the last 24 hours, has pain interfered with the following? General activity 3 Relation with others 3 Enjoyment of life 3 What TIME of day is your pain at its worst? daytime Sleep (in general) Good  Pain is worse with: sitting and standing Pain improves with: rest, heat/ice and medication Relief from Meds: 7  Mobility walk without assistance Do you have any goals in this area?  no  Function retired Do you have any goals in this area?  no  Neuro/Psych No problems in this area  Prior Studies Any changes since last visit?  no  Physicians involved in your care Any changes since last visit?  no   Family History  Problem Relation Age of Onset  . Diabetes Mother   . Heart disease Mother   . Stroke Mother   . Heart disease Father   . Heart disease Brother    Social History   Social History  . Marital status: Widowed    Spouse name: N/A  . Number of children: N/A  . Years of education: N/A   Social History Main Topics  . Smoking status: Former Smoker    Packs/day: 0.50    Years: 5.00    Types: Cigarettes    Quit date: 03/15/1968  . Smokeless tobacco: Never Used  . Alcohol use 0.6 oz/week    1 Glasses of wine per week     Comment: occasional glass of wine  . Drug use: No  . Sexual activity: Not on file   Other Topics Concern    . Not on file   Social History Narrative  . No narrative on file   Past Surgical History:  Procedure Laterality Date  . RETINAL DETACHMENT SURGERY  9/13   Past Medical History:  Diagnosis Date  . Cervical facet syndrome   . Depression   . Disorders of sacrum   . Enthesopathy of hip region   . Headache   . Hypertension   . Sciatic neuritis   . Synovitis and tenosynovitis   . Vision abnormalities    There were no vitals taken for this visit.  Opioid Risk Score:   Fall Risk Score:  `1  Depression screen PHQ 2/9  Depression screen [facility] 2/9 10/22/2015 05/23/2015 03/20/2015 02/21/2015 11/09/2014 09/13/2014 05/12/2014  Decreased Interest 0 0 0 1 0 3 3  Down, Depressed, Hopeless 0 0 0 0 0 0 0  PHQ - 2 Score 0 0 0 1 0 3 3  Altered sleeping - 0 - - - - 0  Tired, decreased energy - 3 - - - - 3  Change in appetite - 3 - - - - 3  Feeling bad or failure about yourself  - 0 - - - - 0  Trouble concentrating - 0 - - - -  0  Moving slowly or fidgety/restless - 0 - - - - 0  Suicidal thoughts - 0 - - - - 0  PHQ-9 Score - 6 - - - - 9   Review of Systems  Constitutional: Negative.   HENT: Negative.   Eyes: Negative.   Respiratory: Negative.   Cardiovascular: Negative.   Gastrointestinal: Negative.   Endocrine: Negative.   Genitourinary: Negative.   Musculoskeletal: Positive for myalgias.  Skin: Negative.   Allergic/Immunologic: Negative.   Neurological: Negative.   Hematological: Negative.   Psychiatric/Behavioral: Negative.   All other systems reviewed and are negative.      Objective:   Physical Exam  Constitutional: She is oriented to person, place, and time. She appears well-developed and well-nourished.  HENT:  Head: Normocephalic and atraumatic.  Neck: Normal range of motion. Neck supple.  Cardiovascular: Normal rate and regular rhythm.   Pulmonary/Chest: Effort normal and breath sounds normal.  Musculoskeletal:  Normal Muscle Bulk and Muscle Testing Reveals: Upper  Extremities: Full ROM and Muscle Strength 5/5 Lower Extremities: Full ROM and Muscle Strength 5/5 Arises from Table with ease Narrow Based Gait  Neurological: She is alert and oriented to person, place, and time.  Skin: Skin is warm and dry.  Psychiatric: She has a normal mood and affect.  Nursing note and vitals reviewed.         Assessment & Plan:  1. Post laminectomy syndrome with right buttock pain: Pain controlled on Fentanyl and Percocet.  Refilled:Fentanyl patch Decreased to 37.5 mcg # 10- one patch every 3 days and Percocet 10/325 mg # 100--use one every 6 hours as needed One script for Fentanyl given for the following month. She has Fentanyl script at home and second script of Oxycodone given for the following month. We will continue the opioid monitoring program, this consists of regular clinic visits, examinations, urine drug screen, pill counts as well as use of [place] Controlled Substance Reporting System. 2. Neuropathic Pain: Continue Gabapentin 3. Gait Disorder: Continue HEP / Neurology Following 4. Myoclonus with sleep: Continue Klonopin  20 minutes of face to face patient care time was spent during this visit. All questions were encouraged and answered.   F/U in 2 months

## 2016-01-29 NOTE — Addendum Note (Signed)
Addended by: Geryl Rankins D on: 01/29/2016 03:23 PM   Modules accepted: Orders

## 2016-02-05 LAB — TOXASSURE SELECT,+ANTIDEPR,UR

## 2016-02-07 NOTE — Progress Notes (Signed)
Urine drug screen for this encounter is consistent for prescribed medication 

## 2016-03-24 ENCOUNTER — Telehealth: Payer: Self-pay | Admitting: *Deleted

## 2016-03-24 NOTE — Telephone Encounter (Signed)
Butch Penny from [facility] left a message stating that patient needs a tier exception on one of her medications.  She is asking if we had received the form. If not she is asking for a call back so we can give her our secure fax # so that she may send it

## 2016-03-25 ENCOUNTER — Telehealth: Payer: Self-pay

## 2016-03-25 MED ORDER — DULOXETINE HCL 30 MG PO CPEP
30.0000 mg | ORAL_CAPSULE | Freq: Every day | ORAL | 5 refills | Status: DC
Start: 2016-03-25 — End: 2016-09-04

## 2016-03-25 NOTE — Telephone Encounter (Signed)
Medication is in [facility] and was refilled 11/17. I sent new rx into the pharmacy

## 2016-03-25 NOTE — Telephone Encounter (Signed)
Received electronic fax for a refill of duloxetine 30 mg, no mention in previous notes as to continue this medication, please advise

## 2016-03-27 NOTE — Telephone Encounter (Signed)
Forms filled out, signed, and submitted via fax

## 2016-04-01 ENCOUNTER — Encounter: Payer: Self-pay | Admitting: Physical Medicine & Rehabilitation

## 2016-04-01 ENCOUNTER — Encounter: Payer: PPO | Attending: Physical Medicine and Rehabilitation | Admitting: Physical Medicine & Rehabilitation

## 2016-04-01 VITALS — BP 150/99 | HR 82

## 2016-04-01 DIAGNOSIS — M961 Postlaminectomy syndrome, not elsewhere classified: Secondary | ICD-10-CM

## 2016-04-01 DIAGNOSIS — M533 Sacrococcygeal disorders, not elsewhere classified: Secondary | ICD-10-CM | POA: Diagnosis not present

## 2016-04-01 DIAGNOSIS — M2170 Unequal limb length (acquired), unspecified site: Secondary | ICD-10-CM | POA: Diagnosis not present

## 2016-04-01 DIAGNOSIS — M1288 Other specific arthropathies, not elsewhere classified, other specified site: Secondary | ICD-10-CM | POA: Diagnosis not present

## 2016-04-01 DIAGNOSIS — M47816 Spondylosis without myelopathy or radiculopathy, lumbar region: Secondary | ICD-10-CM

## 2016-04-01 MED ORDER — OXYCODONE-ACETAMINOPHEN 10-325 MG PO TABS: 1.0000 | ORAL_TABLET | Freq: Three times a day (TID) | ORAL | 0 refills | Status: DC | PRN

## 2016-04-01 MED ORDER — FENTANYL 25 MCG/HR TD PT72: 25.0000 ug | MEDICATED_PATCH | TRANSDERMAL | 0 refills | Status: DC

## 2016-04-01 NOTE — Patient Instructions (Signed)
PLEASE FEEL FREE TO CALL OUR OFFICE WITH ANY PROBLEMS OR QUESTIONS ([phone])      

## 2016-04-01 NOTE — Progress Notes (Signed)
Subjective:    Patient ID: Alexandria Mclaughlin, female    DOB: 04/20/1940, 76 y.o.   MRN: [id number]  HPI  Mrs. Blasdell is here in follow up of her chronic pain. She states that her pain levels have been fairly controlled. I decreased her fentanyl to 37.mcg at last visit with reasonable results. She has realized that she has had to use more her percocet as a result. From a balance standpoint, she notices an occasional issue but denies any falls or recent changes.    Pain Inventory Average Pain 4 Pain Right Now 3 My pain is intermittent, sharp and stabbing  In the last 24 hours, has pain interfered with the following? General activity 4 Relation with others 5 Enjoyment of life 5 What TIME of day is your pain at its worst? daytime Sleep (in general) Fair  Pain is worse with: sitting and standing Pain improves with: . Relief from Meds: 6  Mobility Do you have any goals in this area?  no  Function Do you have any goals in this area?  no  Neuro/Psych No problems in this area  Prior Studies Any changes since last visit?  no  Physicians involved in your care Any changes since last visit?  no   Family History  Problem Relation Age of Onset  . Diabetes Mother   . Heart disease Mother   . Stroke Mother   . Heart disease Father   . Heart disease Brother    Social History   Social History  . Marital status: Widowed    Spouse name: N/A  . Number of children: N/A  . Years of education: N/A   Social History Main Topics  . Smoking status: Former Smoker    Packs/day: 0.50    Years: 5.00    Types: Cigarettes    Quit date: 03/15/1968  . Smokeless tobacco: Never Used  . Alcohol use 0.6 oz/week    1 Glasses of wine per week     Comment: occasional glass of wine  . Drug use: No  . Sexual activity: Not on file   Other Topics Concern  . Not on file   Social History Narrative  . No narrative on file   Past Surgical History:  Procedure Laterality Date  . RETINAL DETACHMENT  SURGERY  9/13   Past Medical History:  Diagnosis Date  . Cervical facet syndrome   . Depression   . Disorders of sacrum   . Enthesopathy of hip region   . Headache   . Hypertension   . Sciatic neuritis   . Synovitis and tenosynovitis   . Vision abnormalities    There were no vitals taken for this visit.  Opioid Risk Score:   Fall Risk Score:  `1  Depression screen PHQ 2/9  Depression screen [facility] 2/9 10/22/2015 05/23/2015 03/20/2015 02/21/2015 11/09/2014 09/13/2014 05/12/2014  Decreased Interest 0 0 0 1 0 3 3  Down, Depressed, Hopeless 0 0 0 0 0 0 0  PHQ - 2 Score 0 0 0 1 0 3 3  Altered sleeping - 0 - - - - 0  Tired, decreased energy - 3 - - - - 3  Change in appetite - 3 - - - - 3  Feeling bad or failure about yourself  - 0 - - - - 0  Trouble concentrating - 0 - - - - 0  Moving slowly or fidgety/restless - 0 - - - - 0  Suicidal thoughts - 0 - - - -  0  PHQ-9 Score - 6 - - - - 9   Review of Systems  Constitutional: Negative.   HENT: Negative.   Eyes: Negative.   Respiratory: Negative.   Cardiovascular: Negative.   Gastrointestinal: Negative.   Endocrine: Negative.   Genitourinary: Negative.   Musculoskeletal: Negative.   Skin: Negative.   Allergic/Immunologic: Negative.   Neurological: Negative.   Hematological: Negative.   Psychiatric/Behavioral: Negative.        Objective:   Physical Exam  Constitutional: She is oriented to person, place, and time. She appears well-developed and well-nourished.  HENT:  Head: Normocephalic and atraumatic.  Neck: Normal range of motion. Neck supple.  Cardiovascular: RRR   Pulmonary/Chest: CTA. Normal effort Musculoskeletal:  Strength 5/5 in all 4's. Lumbar ROM appears fairly well preserved. She can flex to nearly 90 degrees and extends to 15-20 degrees. She has prominence of the right lumbar paraspinals once again Gait stable.  Neurological: She is alert and oriented to person, place, and time. cogtniitvely normal Skin: Skin is  warm and dry.  Psychiatric: She has a normal mood and affect.  Nursing note and vitals reviewed.         Assessment & Plan:  1. Post laminectomy syndrome with right buttock pain: Pain controlled on Fentanyl and Percocet.  Will further decrease fentanyl to 63mcg/hr# 10- one patch every 3 days and Percocet 10/325 mg # 100--use one every 6 hours as needed One script for Fentanyl and percocet were given for the following month.   We will continue the opioid monitoring program, this consists of regular clinic visits, examinations, urine drug screen, pill counts as well as use of [place] Controlled Substance Reporting System. 2. Neuropathic Pain: Continue Gabapentin which has remained effective 3. Gait Disorder: Continue HEP /Neurology Following  -there is an Actuary of compensation from a balance standpoint which she will have to deal with for the long term 4. Myoclonus with sleep: Continue Klonopin  15 minutes of face to face patient care time was spent during this visit. All questions were encouraged and answered.   F/U in 13months

## 2016-04-02 ENCOUNTER — Encounter: Payer: Self-pay | Admitting: Physical Medicine & Rehabilitation

## 2016-04-02 ENCOUNTER — Telehealth: Payer: Self-pay | Admitting: Physical Medicine & Rehabilitation

## 2016-04-02 NOTE — Telephone Encounter (Signed)
Per [facility] patient has concern about split billing with [facility] not paying facility charge - phoned home left message to call me back

## 2016-04-03 ENCOUNTER — Telehealth: Payer: Self-pay | Admitting: Physical Medicine & Rehabilitation

## 2016-04-03 NOTE — Telephone Encounter (Signed)
PHONED HOME ADVISED PTN [place] BE IN [facility]

## 2016-04-04 MED ORDER — TRAZODONE HCL 100 MG PO TABS: 100.0000 mg | ORAL_TABLET | Freq: Every day | ORAL | 2 refills | Status: DC

## 2016-04-15 DIAGNOSIS — H40023 Open angle with borderline findings, high risk, bilateral: Secondary | ICD-10-CM | POA: Diagnosis not present

## 2016-04-15 DIAGNOSIS — H353131 Nonexudative age-related macular degeneration, bilateral, early dry stage: Secondary | ICD-10-CM | POA: Diagnosis not present

## 2016-04-15 DIAGNOSIS — Z961 Presence of intraocular lens: Secondary | ICD-10-CM | POA: Diagnosis not present

## 2016-04-15 DIAGNOSIS — H532 Diplopia: Secondary | ICD-10-CM | POA: Diagnosis not present

## 2016-04-15 DIAGNOSIS — H35371 Puckering of macula, right eye: Secondary | ICD-10-CM | POA: Diagnosis not present

## 2016-04-15 DIAGNOSIS — H43813 Vitreous degeneration, bilateral: Secondary | ICD-10-CM | POA: Diagnosis not present

## 2016-04-18 ENCOUNTER — Encounter: Payer: Self-pay | Admitting: Physical Medicine & Rehabilitation

## 2016-04-22 DIAGNOSIS — I10 Essential (primary) hypertension: Secondary | ICD-10-CM | POA: Diagnosis not present

## 2016-04-22 DIAGNOSIS — G894 Chronic pain syndrome: Secondary | ICD-10-CM | POA: Diagnosis not present

## 2016-04-22 DIAGNOSIS — Z780 Asymptomatic menopausal state: Secondary | ICD-10-CM | POA: Diagnosis not present

## 2016-04-22 DIAGNOSIS — Z0000 Encounter for general adult medical examination without abnormal findings: Secondary | ICD-10-CM | POA: Diagnosis not present

## 2016-04-22 DIAGNOSIS — E782 Mixed hyperlipidemia: Secondary | ICD-10-CM | POA: Diagnosis not present

## 2016-04-22 DIAGNOSIS — N390 Urinary tract infection, site not specified: Secondary | ICD-10-CM | POA: Diagnosis not present

## 2016-04-25 ENCOUNTER — Other Ambulatory Visit: Payer: Self-pay | Admitting: Physical Medicine & Rehabilitation

## 2016-04-25 DIAGNOSIS — G253 Myoclonus: Secondary | ICD-10-CM

## 2016-04-29 ENCOUNTER — Encounter: Payer: Self-pay | Admitting: Physical Medicine & Rehabilitation

## 2016-04-29 DIAGNOSIS — E782 Mixed hyperlipidemia: Secondary | ICD-10-CM | POA: Diagnosis not present

## 2016-04-29 DIAGNOSIS — E0590 Thyrotoxicosis, unspecified without thyrotoxic crisis or storm: Secondary | ICD-10-CM | POA: Diagnosis not present

## 2016-04-29 DIAGNOSIS — M8580 Other specified disorders of bone density and structure, unspecified site: Secondary | ICD-10-CM | POA: Diagnosis not present

## 2016-04-29 DIAGNOSIS — F1190 Opioid use, unspecified, uncomplicated: Secondary | ICD-10-CM | POA: Diagnosis not present

## 2016-05-02 ENCOUNTER — Encounter: Payer: Self-pay | Admitting: Physical Medicine & Rehabilitation

## 2016-05-06 ENCOUNTER — Telehealth: Payer: Self-pay | Admitting: Physical Medicine & Rehabilitation

## 2016-05-06 NOTE — Telephone Encounter (Signed)
Phoned home and advised [facility] [employer] admitted mistake in billing process - they are rebilling all charges from October - current.

## 2016-05-27 ENCOUNTER — Other Ambulatory Visit: Payer: Self-pay | Admitting: Physical Medicine & Rehabilitation

## 2016-05-28 ENCOUNTER — Encounter: Payer: PPO | Attending: Physical Medicine and Rehabilitation | Admitting: Physical Medicine & Rehabilitation

## 2016-05-28 ENCOUNTER — Encounter: Payer: Self-pay | Admitting: Physical Medicine & Rehabilitation

## 2016-05-28 VITALS — BP 130/77 | HR 76 | Resp 14

## 2016-05-28 DIAGNOSIS — M1288 Other specific arthropathies, not elsewhere classified, other specified site: Secondary | ICD-10-CM | POA: Diagnosis not present

## 2016-05-28 DIAGNOSIS — Z79899 Other long term (current) drug therapy: Secondary | ICD-10-CM

## 2016-05-28 DIAGNOSIS — M47816 Spondylosis without myelopathy or radiculopathy, lumbar region: Secondary | ICD-10-CM

## 2016-05-28 DIAGNOSIS — M533 Sacrococcygeal disorders, not elsewhere classified: Secondary | ICD-10-CM | POA: Insufficient documentation

## 2016-05-28 DIAGNOSIS — Z5181 Encounter for therapeutic drug level monitoring: Secondary | ICD-10-CM | POA: Diagnosis not present

## 2016-05-28 DIAGNOSIS — M961 Postlaminectomy syndrome, not elsewhere classified: Secondary | ICD-10-CM

## 2016-05-28 DIAGNOSIS — M2170 Unequal limb length (acquired), unspecified site: Secondary | ICD-10-CM | POA: Insufficient documentation

## 2016-05-28 MED ORDER — FENTANYL 25 MCG/HR TD PT72: 25.0000 ug | MEDICATED_PATCH | TRANSDERMAL | 0 refills | Status: DC

## 2016-05-28 MED ORDER — OXYCODONE-ACETAMINOPHEN 10-325 MG PO TABS: 1.0000 | ORAL_TABLET | Freq: Three times a day (TID) | ORAL | 0 refills | Status: DC | PRN

## 2016-05-28 NOTE — Progress Notes (Signed)
Subjective:    Patient ID: Alexandria Mclaughlin, female    DOB: Mar 13, 1940, 76 y.o.   MRN: [id number]  HPI   Alexandria Mclaughlin is here in follow up of her chronic pain. We were able to decrease her fentanyl to 44mcg at last visit and she has adjusted to this now. We ran into some issues with her insurance and coverage of her copay/facility fee here. She was quite upset about the charges she has received. She is looking for another pain clinic which will is a provider under her plan.   Otherwise her pain is about the same. She has pain in her righ tbuttock and low back. She tries to stretch daily and has a Clinical research associate. She denies any falls or loss of balance.    Pain Inventory Average Pain 4 Pain Right Now 4 My pain is intermittent, sharp and stabbing  In the last 24 hours, has pain interfered with the following? General activity 3 Relation with others 5 Enjoyment of life 3 What TIME of day is your pain at its worst? daytime Sleep (in general) Good  Pain is worse with: sitting and standing Pain improves with: heat/ice, therapy/exercise and medication Relief from Meds: 6  Mobility walk without assistance Do you have any goals in this area?  no  Function Do you have any goals in this area?  no  Neuro/Psych No problems in this area  Prior Studies Any changes since last visit?  no  Physicians involved in your care Any changes since last visit?  no   Family History  Problem Relation Age of Onset  . Diabetes Mother   . Heart disease Mother   . Stroke Mother   . Heart disease Father   . Heart disease Brother    Social History   Social History  . Marital status: Widowed    Spouse name: N/A  . Number of children: N/A  . Years of education: N/A   Social History Main Topics  . Smoking status: Former Smoker    Packs/day: 0.50    Years: 5.00    Types: Cigarettes    Quit date: 03/15/1968  . Smokeless tobacco: Never Used  . Alcohol use 0.6 oz/week    1 Glasses of wine per week   Comment: occasional glass of wine  . Drug use: No  . Sexual activity: Not Asked   Other Topics Concern  . None   Social History Narrative  . None   Past Surgical History:  Procedure Laterality Date  . RETINAL DETACHMENT SURGERY  9/13   Past Medical History:  Diagnosis Date  . Cervical facet syndrome   . Depression   . Disorders of sacrum   . Enthesopathy of hip region   . Headache   . Hypertension   . Sciatic neuritis   . Synovitis and tenosynovitis   . Vision abnormalities    BP 130/77 (BP Location: Right Arm, Patient Position: Sitting, Cuff Size: Normal)   Pulse 76   Resp 14   SpO2 95%   Opioid Risk Score:   Fall Risk Score:  `1  Depression screen PHQ 2/9  Depression screen [facility] 2/9 10/22/2015 05/23/2015 03/20/2015 02/21/2015 11/09/2014 09/13/2014 05/12/2014  Decreased Interest 0 0 0 1 0 3 3  Down, Depressed, Hopeless 0 0 0 0 0 0 0  PHQ - 2 Score 0 0 0 1 0 3 3  Altered sleeping - 0 - - - - 0  Tired, decreased energy - 3 - - - -  3  Change in appetite - 3 - - - - 3  Feeling bad or failure about yourself  - 0 - - - - 0  Trouble concentrating - 0 - - - - 0  Moving slowly or fidgety/restless - 0 - - - - 0  Suicidal thoughts - 0 - - - - 0  PHQ-9 Score - 6 - - - - 9    Review of Systems  Constitutional: Negative.   HENT: Negative.   Eyes: Negative.   Respiratory: Negative.   Cardiovascular: Negative.   Endocrine: Negative.   Genitourinary: Negative.   Musculoskeletal: Positive for back pain and gait problem.  Skin: Negative.   Allergic/Immunologic: Negative.   Hematological: Negative.   Psychiatric/Behavioral: Negative.   All other systems reviewed and are negative.      Objective:   Physical Exam  Constitutional: She is oriented to person, place, and time. She appears well-developedand well-nourished.  HENT:  Head: Normocephalicand atraumatic.  Neck: Normal range of motion. Neck supple.  Cardiovascular: RRR  Pulmonary/Chest: CTA Musculoskeletal:    Strength 5/5 in all 4's. Lumbar ROM appears fairly well preserved. She can flex to nearly 90 degrees and extends to 15-20 degrees. Pain with palpation of buttock and piriformis area.  Gait stable.  Neurological: She is alertand oriented to person, place, and time. cogtniitvely normal Skin: Skin is warmand dry.  Psychiatric: She has a normal mood and affect.  Nursing noteand vitalsreviewed.       Assessment & Plan:  1. Post laminectomy syndrome with right buttock pain: Pain controlled on Fentanyl and Percocet.  Continue fentanyl 13mcg/hr# 10- one patch every 3 days and Percocet 10/325 mg # 100--use one every 6 hours as needed rx'es were provided for next month. .   We will continue the opioid monitoring program, this consists of regular clinic visits, examinations, urine drug screen, pill counts as well as use of [place] Controlled Substance Reporting System. [place] was [place] today.  2. Neuropathic Pain: Continue Gabapentin which has remained effective 3. Gait Disorder: Continue HEP /Neurology Following             --reviewed piriformis stretches today. Examples were provided 4. Myoclonus with sleep: Continue Klonopin  15 minutes of face to face patient care time was spent during this visit. All questions were encouraged and answered.   F/U in 23monthswith NP

## 2016-06-03 ENCOUNTER — Other Ambulatory Visit: Payer: Self-pay | Admitting: Physical Medicine & Rehabilitation

## 2016-06-03 NOTE — Telephone Encounter (Signed)
Recieved electronic medication refill request or trazodone, no mention in previous notes as to continue this medication, please advise

## 2016-06-05 ENCOUNTER — Other Ambulatory Visit: Payer: Self-pay | Admitting: *Deleted

## 2016-06-05 MED ORDER — TRAZODONE HCL 100 MG PO TABS: 100.0000 mg | ORAL_TABLET | Freq: Every day | ORAL | 2 refills | Status: DC

## 2016-07-07 ENCOUNTER — Other Ambulatory Visit: Payer: Self-pay | Admitting: Physical Medicine & Rehabilitation

## 2016-07-07 ENCOUNTER — Other Ambulatory Visit: Payer: Self-pay

## 2016-07-07 ENCOUNTER — Encounter: Payer: Self-pay | Admitting: Physical Medicine & Rehabilitation

## 2016-07-29 ENCOUNTER — Encounter: Payer: PPO | Attending: Physical Medicine and Rehabilitation | Admitting: Physical Medicine & Rehabilitation

## 2016-07-29 ENCOUNTER — Encounter: Payer: Self-pay | Admitting: Physical Medicine & Rehabilitation

## 2016-07-29 VITALS — BP 159/91 | HR 67 | Resp 14

## 2016-07-29 DIAGNOSIS — M961 Postlaminectomy syndrome, not elsewhere classified: Secondary | ICD-10-CM | POA: Diagnosis not present

## 2016-07-29 DIAGNOSIS — M533 Sacrococcygeal disorders, not elsewhere classified: Secondary | ICD-10-CM | POA: Diagnosis not present

## 2016-07-29 DIAGNOSIS — M5416 Radiculopathy, lumbar region: Secondary | ICD-10-CM

## 2016-07-29 DIAGNOSIS — M2170 Unequal limb length (acquired), unspecified site: Secondary | ICD-10-CM | POA: Insufficient documentation

## 2016-07-29 DIAGNOSIS — M47816 Spondylosis without myelopathy or radiculopathy, lumbar region: Secondary | ICD-10-CM

## 2016-07-29 DIAGNOSIS — M4696 Unspecified inflammatory spondylopathy, lumbar region: Secondary | ICD-10-CM | POA: Diagnosis not present

## 2016-07-29 HISTORY — DX: Radiculopathy, lumbar region: M54.16

## 2016-07-29 MED ORDER — OXYCODONE-ACETAMINOPHEN 10-325 MG PO TABS: 1.0000 | ORAL_TABLET | Freq: Three times a day (TID) | ORAL | 0 refills | Status: DC | PRN

## 2016-07-29 MED ORDER — FENTANYL 25 MCG/HR TD PT72: 25.0000 ug | MEDICATED_PATCH | TRANSDERMAL | 0 refills | Status: DC

## 2016-07-29 MED ORDER — GABAPENTIN 300 MG PO CAPS: 300.0000 mg | ORAL_CAPSULE | Freq: Every day | ORAL | 1 refills | Status: DC

## 2016-07-29 NOTE — Progress Notes (Signed)
Subjective:    Patient ID: Alexandria Mclaughlin, female    DOB: 1940/05/23, 76 y.o.   MRN: [id number]  HPI  Alexandria Mclaughlin is here in follow up of her chronic back and pelvic pain. She has had increased sciatic pain down the right leg over the last couple months. She is noticing it at night when she lays on her leg and if she is walking for longer distances. The pain is sharp and tingling and radiates from her low back and buttock to the dorsum of the right foot. She doesn't report  Recent injury and notes that she continues to go to the gym each week and works with a trainer on stretches and exercises.   She remains on fentanyl and percocet for pain control. She has been compliant with our program and pill counts have been consistent.    Pain Inventory Average Pain 5 Pain Right Now 4 My pain is intermittent, sharp and stabbing  In the last 24 hours, has pain interfered with the following? General activity 5 Relation with others 5 Enjoyment of life 5 What TIME of day is your pain at its worst? daytime Sleep (in general) Good  Pain is worse with: walking, sitting and standing Pain improves with: heat/ice, therapy/exercise, medication and TENS Relief from Meds: no selection  Mobility Do you have any goals in this area?  no  Function Do you have any goals in this area?  no  Neuro/Psych No problems in this area  Prior Studies Any changes since last visit?  no  Physicians involved in your care Any changes since last visit?  no   Family History  Problem Relation Age of Onset  . Diabetes Mother   . Heart disease Mother   . Stroke Mother   . Heart disease Father   . Heart disease Brother    Social History   Social History  . Marital status: Widowed    Spouse name: N/A  . Number of children: N/A  . Years of education: N/A   Social History Main Topics  . Smoking status: Former Smoker    Packs/day: 0.50    Years: 5.00    Types: Cigarettes    Quit date: 03/15/1968  . Smokeless  tobacco: Never Used  . Alcohol use 0.6 oz/week    1 Glasses of wine per week     Comment: occasional glass of wine  . Drug use: No  . Sexual activity: Not Asked   Other Topics Concern  . None   Social History Narrative  . None   Past Surgical History:  Procedure Laterality Date  . RETINAL DETACHMENT SURGERY  9/13   Past Medical History:  Diagnosis Date  . Cervical facet syndrome ([place])   . Depression   . Disorders of sacrum   . Enthesopathy of hip region   . Headache   . Hypertension   . Sciatic neuritis   . Synovitis and tenosynovitis   . Vision abnormalities    BP (!) 159/91 (BP Location: Right Arm, Patient Position: Sitting, Cuff Size: Normal)   Pulse 67   Resp 14   SpO2 98%   Opioid Risk Score:   Fall Risk Score:  `1  Depression screen PHQ 2/9  Depression screen [facility] 2/9 10/22/2015 05/23/2015 03/20/2015 02/21/2015 11/09/2014 09/13/2014 05/12/2014  Decreased Interest 0 0 0 1 0 3 3  Down, Depressed, Hopeless 0 0 0 0 0 0 0  PHQ - 2 Score 0 0 0 1 0 3 3  Altered sleeping - 0 - - - - 0  Tired, decreased energy - 3 - - - - 3  Change in appetite - 3 - - - - 3  Feeling bad or failure about yourself  - 0 - - - - 0  Trouble concentrating - 0 - - - - 0  Moving slowly or fidgety/restless - 0 - - - - 0  Suicidal thoughts - 0 - - - - 0  PHQ-9 Score - 6 - - - - 9    Review of Systems  Constitutional: Negative.   HENT: Negative.   Eyes: Negative.   Respiratory: Negative.   Cardiovascular: Negative.   Gastrointestinal: Negative.   Endocrine: Negative.   Genitourinary: Negative.   Musculoskeletal: Positive for back pain.  Skin: Negative.   Allergic/Immunologic: Negative.   Neurological: Negative.   Hematological: Negative.   Psychiatric/Behavioral: Negative.   All other systems reviewed and are negative.      Objective:   Physical Exam Constitutional: She is oriented to person, place, and time. She appears well-developedand well-nourished.  HENT:  Head:  Normocephalicand atraumatic.  Neck: Normal range of motion. Neck supple.  Cardiovascular: RRR Pulmonary/Chest: CTA Musculoskeletal:  Strength remains 5/5 in all 4's. No sensory findings are appreciated.  Lumbar ROM appears fairly well preserved. She can flex only to about 80 degrees and extends to 15-20 degrees. Pain with palpation of buttock and piriformis area on the right once again.  Gait stable. DTR's are all trace. SLR on right causes hamstring pain. Cross leg stretch provokes hip/gluteal discomfort.  Neurological: She is alertand oriented to person, place, and time. cogtniitvely normal Skin: Skin is warmand dry.  Psychiatric: She has a normal mood and affect.  Nursing noteand vitalsreviewed.       Assessment & Plan:  1. Post laminectomy syndrome with right buttock pain: Pain controlled on Fentanyl and Percocet.  Continue fentanyl 66mcg/hr# 10- one patch every 3 days and Percocet 10/325 mg # 100--use one every 6 hours as needed rx'es were provided for next month. .  We will continue the opioid monitoring program, this consists of regular clinic visits, examinations, urine drug screen, pill counts as well as use of [place] Controlled Substance Reporting System. [place] was [place] today.  .  2. Neuropathic Pain/right sided lumbar radicular pain:   -titrate gabapentin to 5x daily  -reviewed extensive stretches today for her hip and low back  -asked to her to be thinking about her activiies and what may be exacerbating her pain  -if symptoms increase, consider MRI or CT myelogram 3. Gait Disorder: Continue HEP /Neurology Following  4. Myoclonus with sleep: Continue Klonopin  70minutes of face to face patient care time was spent during this visit. All questions were encouraged and answered.  Greater than 50% of time during this encounter was spent counseling patient/family in regard to pain generators, stretches, etc.

## 2016-07-29 NOTE — Patient Instructions (Signed)
PLEASE FEEL FREE TO CALL OUR OFFICE WITH ANY PROBLEMS OR QUESTIONS ([phone])    THINK ABOUT WHAT HELPS YOUR BACK AND WHAT MIGHT BE IRRITATING IT

## 2016-09-04 ENCOUNTER — Other Ambulatory Visit: Payer: Self-pay | Admitting: Physical Medicine & Rehabilitation

## 2016-09-08 ENCOUNTER — Other Ambulatory Visit: Payer: Self-pay | Admitting: Physical Medicine & Rehabilitation

## 2016-09-08 DIAGNOSIS — G253 Myoclonus: Secondary | ICD-10-CM

## 2016-10-08 ENCOUNTER — Encounter: Payer: Self-pay | Admitting: Physical Medicine & Rehabilitation

## 2016-10-08 ENCOUNTER — Encounter: Payer: PPO | Attending: Physical Medicine and Rehabilitation | Admitting: Physical Medicine & Rehabilitation

## 2016-10-08 VITALS — BP 127/82 | HR 73

## 2016-10-08 DIAGNOSIS — M533 Sacrococcygeal disorders, not elsewhere classified: Secondary | ICD-10-CM | POA: Insufficient documentation

## 2016-10-08 DIAGNOSIS — M2170 Unequal limb length (acquired), unspecified site: Secondary | ICD-10-CM | POA: Insufficient documentation

## 2016-10-08 DIAGNOSIS — M4696 Unspecified inflammatory spondylopathy, lumbar region: Secondary | ICD-10-CM | POA: Diagnosis not present

## 2016-10-08 DIAGNOSIS — M47816 Spondylosis without myelopathy or radiculopathy, lumbar region: Secondary | ICD-10-CM

## 2016-10-08 DIAGNOSIS — M5416 Radiculopathy, lumbar region: Secondary | ICD-10-CM

## 2016-10-08 DIAGNOSIS — M961 Postlaminectomy syndrome, not elsewhere classified: Secondary | ICD-10-CM

## 2016-10-08 MED ORDER — OXYCODONE-ACETAMINOPHEN 10-325 MG PO TABS: 1.0000 | ORAL_TABLET | Freq: Three times a day (TID) | ORAL | 0 refills | Status: DC | PRN

## 2016-10-08 MED ORDER — FENTANYL 25 MCG/HR TD PT72: 25.0000 ug | MEDICATED_PATCH | TRANSDERMAL | 0 refills | Status: DC

## 2016-10-08 NOTE — Progress Notes (Signed)
Subjective:    Patient ID: Alexandria Mclaughlin, female    DOB: Dec 14, 1940, 76 y.o.   MRN: [id number]  HPI  Alexandria Mclaughlin is here in follow up of her chronic pain. Her pain levels are stable. She remains active with her family and church. The increase in gabapentin has helped her radicular pain. She also has had good luck with stretching and increasing activity.   She is on fentanyl and percocet for pain control which seem to be working for her. She is moving her bowels regularly  She has a trip planned to [facility] next month which she has mixed emotions about.   Pain Inventory Average Pain 4 Pain Right Now 4 My pain is intermittent, sharp and stabbing  In the last 24 hours, has pain interfered with the following? General activity 4 Relation with others 5 Enjoyment of life 5 What TIME of day is your pain at its worst? daytime Sleep (in general) Fair  Pain is worse with: sitting and standing Pain improves with: rest, heat/ice and medication Relief from Meds: 5  Mobility walk without assistance  Function Do you have any goals in this area?  no  Neuro/Psych No problems in this area  Prior Studies Any changes since last visit?  no  Physicians involved in your care Any changes since last visit?  no Primary care     Family History  Problem Relation Age of Onset  . Diabetes Mother   . Heart disease Mother   . Stroke Mother   . Heart disease Father   . Heart disease Brother    Social History   Social History  . Marital status: Widowed    Spouse name: N/A  . Number of children: N/A  . Years of education: N/A   Social History Main Topics  . Smoking status: Former Smoker    Packs/day: 0.50    Years: 5.00    Types: Cigarettes    Quit date: 03/15/1968  . Smokeless tobacco: Never Used  . Alcohol use 0.6 oz/week    1 Glasses of wine per week     Comment: occasional glass of wine  . Drug use: No  . Sexual activity: Not Asked   Other Topics Concern  . None   Social  History Narrative  . None   Past Surgical History:  Procedure Laterality Date  . RETINAL DETACHMENT SURGERY  9/13   Past Medical History:  Diagnosis Date  . Cervical facet syndrome ([place])   . Depression   . Disorders of sacrum   . Enthesopathy of hip region   . Headache   . Hypertension   . Sciatic neuritis   . Synovitis and tenosynovitis   . Vision abnormalities    There were no vitals taken for this visit.  Opioid Risk Score:   Fall Risk Score:  `1  Depression screen PHQ 2/9  Depression screen [facility] 2/9 10/22/2015 05/23/2015 03/20/2015 02/21/2015 11/09/2014 09/13/2014 05/12/2014  Decreased Interest 0 0 0 1 0 3 3  Down, Depressed, Hopeless 0 0 0 0 0 0 0  PHQ - 2 Score 0 0 0 1 0 3 3  Altered sleeping - 0 - - - - 0  Tired, decreased energy - 3 - - - - 3  Change in appetite - 3 - - - - 3  Feeling bad or failure about yourself  - 0 - - - - 0  Trouble concentrating - 0 - - - - 0  Moving slowly or fidgety/restless -  0 - - - - 0  Suicidal thoughts - 0 - - - - 0  PHQ-9 Score - 6 - - - - 9     Review of Systems     Objective:   Physical Exam  Constitutional: She is oriented to person, place, and time. She appears well-developedand well-nourished.  HENT:  Head: Normocephalicand atraumatic.  Neck: Normal range of motion. Neck supple.  Cardiovascular:  RRR Pulmonary/Chest: CTA B Musculoskeletal:  Strength remains 5/5 in all 4's. No sensory findings are appreciated.  Lumbar ROM appears fairly well preserved. She can flex to about 80-90 degrees and extends to 15-20 degrees.  Gait stable. DTR's are all trace. SLR on right causes hamstring pain.  Neurological: She is alertand oriented to person, place, and time. cogtniitvely normal Skin: Skin is warmand dry.  Psychiatric: She has a normal mood and affect.   .       Assessment & Plan:  1. Post laminectomy syndrome with right buttock pain: Pain controlled on Fentanyl and Percocet.  Continuefentanyl 39mcg/hr#  10- one patch every 3 days and Percocet 10/325 mg # 90--use one every 6 hours as needed rx'es were provided for next month also .  We will continue the opioid monitoring program, this consists of regular clinic visits, examinations, urine drug screen, pill counts as well as use of [place] Controlled Substance Reporting System. [place] was reviewed today.  2. Neuropathic Pain/right sided lumbar radicular pain:              -continue gabapentin 300mg  TID and 600mg  qhs             -continue HEP 3. Gait Disorder: Continue HEP /Neurology Following  4. Myoclonus with sleep: Continue Klonopin  10 minutes of face to face patient care time was spent during this visit. All questions were encouraged and answered.

## 2016-10-08 NOTE — Patient Instructions (Signed)
PLEASE FEEL FREE TO CALL OUR OFFICE WITH ANY PROBLEMS OR QUESTIONS ([phone])      

## 2016-10-14 DIAGNOSIS — H43813 Vitreous degeneration, bilateral: Secondary | ICD-10-CM | POA: Diagnosis not present

## 2016-10-14 DIAGNOSIS — H524 Presbyopia: Secondary | ICD-10-CM | POA: Diagnosis not present

## 2016-10-14 DIAGNOSIS — H35371 Puckering of macula, right eye: Secondary | ICD-10-CM | POA: Diagnosis not present

## 2016-10-14 DIAGNOSIS — H5213 Myopia, bilateral: Secondary | ICD-10-CM | POA: Diagnosis not present

## 2016-10-14 DIAGNOSIS — H353131 Nonexudative age-related macular degeneration, bilateral, early dry stage: Secondary | ICD-10-CM | POA: Diagnosis not present

## 2016-10-14 DIAGNOSIS — H532 Diplopia: Secondary | ICD-10-CM | POA: Diagnosis not present

## 2016-10-14 DIAGNOSIS — Z961 Presence of intraocular lens: Secondary | ICD-10-CM | POA: Diagnosis not present

## 2016-10-14 DIAGNOSIS — H40023 Open angle with borderline findings, high risk, bilateral: Secondary | ICD-10-CM | POA: Diagnosis not present

## 2016-10-14 DIAGNOSIS — H52223 Regular astigmatism, bilateral: Secondary | ICD-10-CM | POA: Diagnosis not present

## 2016-11-04 DIAGNOSIS — E782 Mixed hyperlipidemia: Secondary | ICD-10-CM | POA: Diagnosis not present

## 2016-11-04 DIAGNOSIS — E0590 Thyrotoxicosis, unspecified without thyrotoxic crisis or storm: Secondary | ICD-10-CM | POA: Diagnosis not present

## 2016-11-11 DIAGNOSIS — Z23 Encounter for immunization: Secondary | ICD-10-CM | POA: Diagnosis not present

## 2016-11-11 DIAGNOSIS — E0590 Thyrotoxicosis, unspecified without thyrotoxic crisis or storm: Secondary | ICD-10-CM | POA: Diagnosis not present

## 2016-11-11 DIAGNOSIS — G894 Chronic pain syndrome: Secondary | ICD-10-CM | POA: Diagnosis not present

## 2016-11-11 DIAGNOSIS — E782 Mixed hyperlipidemia: Secondary | ICD-10-CM | POA: Diagnosis not present

## 2016-11-11 DIAGNOSIS — I10 Essential (primary) hypertension: Secondary | ICD-10-CM | POA: Diagnosis not present

## 2016-11-14 DIAGNOSIS — Z1211 Encounter for screening for malignant neoplasm of colon: Secondary | ICD-10-CM | POA: Diagnosis not present

## 2016-11-14 DIAGNOSIS — Z1212 Encounter for screening for malignant neoplasm of rectum: Secondary | ICD-10-CM | POA: Diagnosis not present

## 2016-11-27 DIAGNOSIS — R195 Other fecal abnormalities: Secondary | ICD-10-CM | POA: Diagnosis not present

## 2016-11-29 ENCOUNTER — Other Ambulatory Visit: Payer: Self-pay | Admitting: Physical Medicine & Rehabilitation

## 2016-11-30 ENCOUNTER — Encounter: Payer: Self-pay | Admitting: Physical Medicine & Rehabilitation

## 2016-12-09 ENCOUNTER — Encounter: Payer: Self-pay | Admitting: Physical Medicine & Rehabilitation

## 2016-12-09 ENCOUNTER — Encounter: Payer: PPO | Attending: Physical Medicine and Rehabilitation | Admitting: Physical Medicine & Rehabilitation

## 2016-12-09 VITALS — BP 149/94 | HR 86

## 2016-12-09 DIAGNOSIS — M2170 Unequal limb length (acquired), unspecified site: Secondary | ICD-10-CM | POA: Diagnosis not present

## 2016-12-09 DIAGNOSIS — M47816 Spondylosis without myelopathy or radiculopathy, lumbar region: Secondary | ICD-10-CM | POA: Diagnosis not present

## 2016-12-09 DIAGNOSIS — M533 Sacrococcygeal disorders, not elsewhere classified: Secondary | ICD-10-CM | POA: Insufficient documentation

## 2016-12-09 DIAGNOSIS — M961 Postlaminectomy syndrome, not elsewhere classified: Secondary | ICD-10-CM | POA: Diagnosis not present

## 2016-12-09 MED ORDER — OXYCODONE-ACETAMINOPHEN 10-325 MG PO TABS: 1.0000 | ORAL_TABLET | Freq: Three times a day (TID) | ORAL | 0 refills | Status: DC | PRN

## 2016-12-09 MED ORDER — FENTANYL 25 MCG/HR TD PT72: 25.0000 ug | MEDICATED_PATCH | TRANSDERMAL | 0 refills | Status: DC

## 2016-12-09 NOTE — Patient Instructions (Addendum)
PLEASE FEEL FREE TO CALL OUR OFFICE WITH ANY PROBLEMS OR QUESTIONS ([phone])   FOR CONSTIPATION TRY  SENOKOT-S 1-2 TABS AT NIGHT.   [place]

## 2016-12-09 NOTE — Addendum Note (Signed)
Addended by: Marland Mcalpine B on: 12/09/2016 12:40 PM   Modules accepted: Orders

## 2016-12-09 NOTE — Progress Notes (Signed)
Subjective:    Patient ID: Alexandria Mclaughlin, female    DOB: May 12, 1940, 76 y.o.   MRN: [id number]  HPI  Patient states has started using hemp oil.  She feels that it has been helpful. She continues on her fentanyl and percocet as prescribed.   She has been constipated. She apparently had a positive hemoccult test and is going for a colonoscopy later this Fall.   She is sleeping well, using trazodone nightly.   Pain Inventory Average Pain 4 Pain Right Now 4 My pain is intermittent, sharp and stabbing  In the last 24 hours, has pain interfered with the following? General activity 4 Relation with others 5 Enjoyment of life 5 What TIME of day is your pain at its worst? daytime Sleep (in general) Good  Pain is worse with: walking, bending, sitting and standing Pain improves with: rest, heat/ice, therapy/exercise and medication Relief from Meds: 6  Mobility Do you have any goals in this area?  no  Function Do you have any goals in this area?  no  Neuro/Psych No problems in this area  Prior Studies Any changes since last visit?  no  Physicians involved in your care Any changes since last visit?  no   Family History  Problem Relation Age of Onset  . Diabetes Mother   . Heart disease Mother   . Stroke Mother   . Heart disease Father   . Heart disease Brother    Social History   Social History  . Marital status: Widowed    Spouse name: N/A  . Number of children: N/A  . Years of education: N/A   Social History Main Topics  . Smoking status: Former Smoker    Packs/day: 0.50    Years: 5.00    Types: Cigarettes    Quit date: 03/15/1968  . Smokeless tobacco: Never Used  . Alcohol use 0.6 oz/week    1 Glasses of wine per week     Comment: occasional glass of wine  . Drug use: No  . Sexual activity: Not on file   Other Topics Concern  . Not on file   Social History Narrative  . No narrative on file   Past Surgical History:  Procedure Laterality Date  .  RETINAL DETACHMENT SURGERY  9/13   Past Medical History:  Diagnosis Date  . Cervical facet syndrome ([place])   . Depression   . Disorders of sacrum   . Enthesopathy of hip region   . Headache   . Hypertension   . Sciatic neuritis   . Synovitis and tenosynovitis   . Vision abnormalities    There were no vitals taken for this visit.  Opioid Risk Score:   Fall Risk Score:  `1  Depression screen PHQ 2/9  Depression screen [facility] 2/9 10/22/2015 05/23/2015 03/20/2015 02/21/2015 11/09/2014 09/13/2014 05/12/2014  Decreased Interest 0 0 0 1 0 3 3  Down, Depressed, Hopeless 0 0 0 0 0 0 0  PHQ - 2 Score 0 0 0 1 0 3 3  Altered sleeping - 0 - - - - 0  Tired, decreased energy - 3 - - - - 3  Change in appetite - 3 - - - - 3  Feeling bad or failure about yourself  - 0 - - - - 0  Trouble concentrating - 0 - - - - 0  Moving slowly or fidgety/restless - 0 - - - - 0  Suicidal thoughts - 0 - - - - 0  PHQ-9 Score - 6 - - - - 9     Review of Systems  Constitutional: Negative.   HENT: Negative.   Eyes: Negative.   Respiratory: Negative.   Cardiovascular: Negative.   Gastrointestinal: Negative.   Endocrine: Negative.   Genitourinary: Negative.   Musculoskeletal: Negative.   Skin: Negative.   Allergic/Immunologic: Negative.   Neurological: Negative.   Hematological: Negative.   Psychiatric/Behavioral: Negative.   All other systems reviewed and are negative.      Objective:   Physical Exam  Constitutional: She is oriented to person, place, and time. She appears well-developedand well-nourished.  HENT:  Head: Normocephalicand atraumatic.  Neck: Normal range of motion. Neck supple.  Cardiovascular:  RRR Pulmonary/Chest: CTA B Musculoskeletal:  Strength remains 5/5 in all 4's. No sensory findings are appreciated. Lumbar ROM appears fairly well preserved. Flexion is 80-90 degrees. Gait stable. SLR equivocal.   Neurological: she is alert and appropriate.  Skin: Skin is warmand dry.    Psychiatric: She has a normal mood and affect.   .          1. Post laminectomy syndrome with right buttock pain: Pain controlled on Fentanyl and Percocet.  Continuefentanyl 34mcg/hr#10one patch every 3 days and Percocet 10/325 mg #90--use one every 6 hours as needed rx'es were provided for next month also .  We will continue the opioid monitoring program, this consists of regular clinic visits, examinations, routine drug screening, pill counts as well as use of [place] Controlled Substance Reporting System. [place] was reviewed today.    -oral drug swab today 2. Neuropathic Pain/right sided lumbar radicular pain:  -continue gabapentin 300mg  TID and 600mg  qhs -continue HEP and increase activity as posible 3. Gait Disorder: Continue HEP /Neurology Following  4. Myoclonus with sleep: Continue Klonopin 5. Constipation opioid induced: begin scheduled senokot-s  15 minutes of face to face patient care time was spent during this visit. All questions were encouraged and answered. Greater than 50% of time during this encounter was spent counseling patient/family in regard to pain mgt strategies, alternative pain mgt strategies, etc.

## 2016-12-13 LAB — DRUG TOX MONITOR 1 W/CONF, ORAL FLD
AMPHETAMINES: NEGATIVE ng/mL (ref <10)
BARBITURATES: NEGATIVE ng/mL (ref <10)
Benzodiazepines: NEGATIVE ng/mL (ref <0.50)
Buprenorphine: NEGATIVE ng/mL (ref <0.025)
CODEINE: NEGATIVE ng/mL (ref <2.5)
Cocaine: NEGATIVE ng/mL (ref <2.5)
DIHYDROCODEINE: NEGATIVE ng/mL (ref <2.5)
FENTANYL: 1.09 ng/mL — AB (ref <0.10)
Fentanyl: POSITIVE ng/mL — AB (ref <0.10)
HEROIN METABOLITE: NEGATIVE ng/mL (ref <1.0)
Hydrocodone: NEGATIVE ng/mL (ref <2.5)
Hydromorphone: NEGATIVE ng/mL (ref <2.5)
MARIJUANA: NEGATIVE ng/mL (ref <2.5)
MDMA: NEGATIVE ng/mL (ref <10)
Meperidine: NEGATIVE ng/mL (ref <5.0)
Meprobamate: NEGATIVE ng/mL (ref <2.5)
Methadone: NEGATIVE ng/mL (ref <5.0)
Morphine: NEGATIVE ng/mL (ref <2.5)
NICOTINE METABOLITE: NEGATIVE ng/mL (ref <5.0)
Norhydrocodone: NEGATIVE ng/mL (ref <2.5)
Noroxycodone: 4.1 ng/mL — ABNORMAL HIGH (ref <2.5)
OXYCODONE: 229.6 ng/mL — AB (ref <2.5)
OXYMORPHONE: NEGATIVE ng/mL (ref <2.5)
Opiates: POSITIVE ng/mL — AB (ref <2.5)
PHENCYCLIDINE: NEGATIVE ng/mL (ref <10)
PROPOXYPHENE: NEGATIVE ng/mL (ref <5.0)
TAPENTADOL: NEGATIVE ng/mL (ref <5.0)
Tramadol: NEGATIVE ng/mL (ref <5.0)
Zolpidem: NEGATIVE ng/mL (ref <5.0)

## 2016-12-13 LAB — DRUG TOX ALC METAB W/CON, ORAL FLD: Alcohol Metabolite: NEGATIVE ng/mL (ref <25)

## 2016-12-15 ENCOUNTER — Telehealth: Payer: Self-pay | Admitting: *Deleted

## 2016-12-15 NOTE — Telephone Encounter (Signed)
Oral swab drug screen was consistent for prescribed medications.  ?

## 2016-12-25 DIAGNOSIS — R195 Other fecal abnormalities: Secondary | ICD-10-CM | POA: Diagnosis not present

## 2017-01-15 ENCOUNTER — Other Ambulatory Visit: Payer: Self-pay | Admitting: Physical Medicine & Rehabilitation

## 2017-01-15 DIAGNOSIS — G253 Myoclonus: Secondary | ICD-10-CM

## 2017-01-18 ENCOUNTER — Encounter: Payer: Self-pay | Admitting: Physical Medicine & Rehabilitation

## 2017-01-28 ENCOUNTER — Other Ambulatory Visit: Payer: Self-pay | Admitting: Physical Medicine & Rehabilitation

## 2017-01-28 DIAGNOSIS — M533 Sacrococcygeal disorders, not elsewhere classified: Secondary | ICD-10-CM

## 2017-01-28 DIAGNOSIS — M47816 Spondylosis without myelopathy or radiculopathy, lumbar region: Secondary | ICD-10-CM

## 2017-01-28 DIAGNOSIS — M5416 Radiculopathy, lumbar region: Secondary | ICD-10-CM

## 2017-01-28 DIAGNOSIS — M961 Postlaminectomy syndrome, not elsewhere classified: Secondary | ICD-10-CM

## 2017-02-02 ENCOUNTER — Encounter: Payer: PPO | Attending: Physical Medicine and Rehabilitation | Admitting: Physical Medicine & Rehabilitation

## 2017-02-02 DIAGNOSIS — M2170 Unequal limb length (acquired), unspecified site: Secondary | ICD-10-CM | POA: Insufficient documentation

## 2017-02-02 DIAGNOSIS — M533 Sacrococcygeal disorders, not elsewhere classified: Secondary | ICD-10-CM | POA: Insufficient documentation

## 2017-02-04 DIAGNOSIS — K648 Other hemorrhoids: Secondary | ICD-10-CM | POA: Diagnosis not present

## 2017-02-04 DIAGNOSIS — R195 Other fecal abnormalities: Secondary | ICD-10-CM | POA: Diagnosis not present

## 2017-02-05 ENCOUNTER — Encounter: Payer: Self-pay | Admitting: Registered Nurse

## 2017-02-05 ENCOUNTER — Encounter: Payer: PPO | Admitting: Registered Nurse

## 2017-02-05 ENCOUNTER — Ambulatory Visit
Admission: RE | Admit: 2017-02-05 | Discharge: 2017-02-05 | Disposition: A | Discharge status: Home / Self Care | Payer: PPO | Source: Ambulatory Visit | Attending: Registered Nurse | Admitting: Registered Nurse

## 2017-02-05 VITALS — BP 142/84 | HR 97

## 2017-02-05 DIAGNOSIS — M25551 Pain in right hip: Secondary | ICD-10-CM

## 2017-02-05 DIAGNOSIS — Z5181 Encounter for therapeutic drug level monitoring: Secondary | ICD-10-CM | POA: Diagnosis not present

## 2017-02-05 DIAGNOSIS — M533 Sacrococcygeal disorders, not elsewhere classified: Secondary | ICD-10-CM | POA: Diagnosis not present

## 2017-02-05 DIAGNOSIS — G894 Chronic pain syndrome: Secondary | ICD-10-CM

## 2017-02-05 DIAGNOSIS — M2170 Unequal limb length (acquired), unspecified site: Secondary | ICD-10-CM | POA: Diagnosis not present

## 2017-02-05 DIAGNOSIS — M961 Postlaminectomy syndrome, not elsewhere classified: Secondary | ICD-10-CM

## 2017-02-05 DIAGNOSIS — M47816 Spondylosis without myelopathy or radiculopathy, lumbar region: Secondary | ICD-10-CM

## 2017-02-05 DIAGNOSIS — M1611 Unilateral primary osteoarthritis, right hip: Secondary | ICD-10-CM | POA: Diagnosis not present

## 2017-02-05 DIAGNOSIS — Z79899 Other long term (current) drug therapy: Secondary | ICD-10-CM | POA: Diagnosis not present

## 2017-02-05 DIAGNOSIS — M5416 Radiculopathy, lumbar region: Secondary | ICD-10-CM

## 2017-02-05 MED ORDER — OXYCODONE-ACETAMINOPHEN 10-325 MG PO TABS: 1.0000 | ORAL_TABLET | Freq: Three times a day (TID) | ORAL | 0 refills | Status: DC | PRN

## 2017-02-05 MED ORDER — FENTANYL 25 MCG/HR TD PT72: 25.0000 ug | MEDICATED_PATCH | TRANSDERMAL | 0 refills | Status: DC

## 2017-02-05 NOTE — Progress Notes (Signed)
Subjective:    Patient ID: Alexandria Mclaughlin, female    DOB: 04-24-1940, 76 y.o.   MRN: [id number]  HPI: Alexandria Mclaughlin is a 76 year old female who returns for follow up appointment  for chronic pain and medication refill. She states her pain is located  in her lower back radiating into her  right buttock ( sacral tenderness) and right lower extremity, also reports right hip pain for the last two weeks, she denies falling. We will order X-Ceballos, she also reports she will f/u with her Orthopedist. She rates her pain 6. Her current exercise regime is walking and performing stretching exercises.   Alexandria Mclaughlin Morphine equivalent is 105.00 MME.  She  is also prescribed Klonopin. .We have discussed the black box warning of using opioids and benzodiazepines. I highlighted the dangers of using these drugs together and discussed the adverse events including respiratory suppression, overdose, cognitive impairment and importance of  compliance with current regimen. She verbalizes understanding, we will continue to monitor and adjust as indicated.    Pain Inventory Average Pain 6 Pain Right Now 6 My pain is constant, sharp and stabbing  In the last 24 hours, has pain interfered with the following? General activity 6 Relation with others 6 Enjoyment of life 6 What TIME of day is your pain at its worst? daytime Sleep (in general) Good  Pain is worse with: sitting and standing Pain improves with: rest, heat/ice and medication Relief from Meds: 7  Mobility walk without assistance Do you have any goals in this area?  no  Function retired Do you have any goals in this area?  no  Neuro/Psych No problems in this area  Prior Studies Any changes since last visit?  no  Physicians involved in your care Any changes since last visit?  no   Family History  Problem Relation Age of Onset  . Diabetes Mother   . Heart disease Mother   . Stroke Mother   . Heart disease Father   . Heart disease Brother     Social History   Socioeconomic History  . Marital status: Widowed    Spouse name: None  . Number of children: None  . Years of education: None  . Highest education level: None  Social Needs  . Financial resource strain: None  . Food insecurity - worry: None  . Food insecurity - inability: None  . Transportation needs - medical: None  . Transportation needs - non-medical: None  Occupational History  . None  Tobacco Use  . Smoking status: Former Smoker    Packs/day: 0.50    Years: 5.00    Pack years: 2.50    Types: Cigarettes    Last attempt to quit: 03/15/1968    Years since quitting: 48.9  . Smokeless tobacco: Never Used  Substance and Sexual Activity  . Alcohol use: Yes    Alcohol/week: 0.6 oz    Types: 1 Glasses of wine per week    Comment: occasional glass of wine  . Drug use: No  . Sexual activity: None  Other Topics Concern  . None  Social History Narrative  . None   Past Surgical History:  Procedure Laterality Date  . RETINAL DETACHMENT SURGERY  9/13   Past Medical History:  Diagnosis Date  . Cervical facet syndrome   . Depression   . Disorders of sacrum   . Enthesopathy of hip region   . Headache   . Hypertension   . Sciatic  neuritis   . Synovitis and tenosynovitis   . Vision abnormalities    There were no vitals taken for this visit.  Opioid Risk Score:   Fall Risk Score:  `1  Depression screen PHQ 2/9  Depression screen [facility] 2/9 10/22/2015 05/23/2015 03/20/2015 02/21/2015 11/09/2014 09/13/2014 05/12/2014  Decreased Interest 0 0 0 1 0 3 3  Down, Depressed, Hopeless 0 0 0 0 0 0 0  PHQ - 2 Score 0 0 0 1 0 3 3  Altered sleeping - 0 - - - - 0  Tired, decreased energy - 3 - - - - 3  Change in appetite - 3 - - - - 3  Feeling bad or failure about yourself  - 0 - - - - 0  Trouble concentrating - 0 - - - - 0  Moving slowly or fidgety/restless - 0 - - - - 0  Suicidal thoughts - 0 - - - - 0  PHQ-9 Score - 6 - - - - 9   Review of Systems   Constitutional: Positive for appetite change.  HENT: Negative.   Eyes: Negative.   Respiratory: Negative.   Cardiovascular: Negative.   Gastrointestinal: Negative.   Endocrine: Negative.   Genitourinary: Negative.   Musculoskeletal: Positive for myalgias.  Skin: Negative.   Allergic/Immunologic: Negative.   Neurological: Negative.   Hematological: Negative.   Psychiatric/Behavioral: Negative.   All other systems reviewed and are negative.      Objective:   Physical Exam  Constitutional: She is oriented to person, place, and time. She appears well-developed and well-nourished.  HENT:  Head: Normocephalic and atraumatic.  Neck: Normal range of motion. Neck supple.  Cardiovascular: Normal rate and regular rhythm.  Pulmonary/Chest: Effort normal and breath sounds normal.  Musculoskeletal:  Normal Muscle Bulk and Muscle Testing Reveals: Upper Extremities: Full ROM and Muscle Strength 5/5 Lumbar Paraspinal Tenderness: L-3-L-5 Sacral Tenderness Right Greater Trochanter Tenderness Lower Extremities: Full ROM and Muscle Strength 5/5 Arises from Table with ease Narrow Based Gait  Neurological: She is alert and oriented to person, place, and time.  Skin: Skin is warm and dry.  Psychiatric: She has a normal mood and affect.  Nursing note and vitals reviewed.         Assessment & Plan:  1. Post laminectomy syndrome with right buttock pain/ Lumbar Facet Arthropathy/ Sacroiliac Joint Dysfunction: Pain controlled on Fentanyl and Percocet. 02/05/2017 Refilled:Fentanyl 25 mcg patch  # 10- one patch every 3 days and Percocet 10/325 mg # 100--use one every 8 hours as needed  #90. Second script of given for the following month. We will continue the opioid monitoring program, this consists of regular clinic visits, examinations, urine drug screen, pill counts as well as use of [place] Controlled Substance Reporting System. 2. Lumbar Radicular  Pain: Continue Gabapentin and  Cymbalta. 02/05/2017 3. Gait Disorder: Continue HEP / Neurology Following. 02/05/2017 4. Myoclonus with sleep: Continue Klonopin. 02/05/2017 5. Right Hip Pain: RX: X-Garay  20 minutes of face to face patient care time was spent during this visit. All questions were encouraged and answered.   F/U in 2 months

## 2017-02-06 ENCOUNTER — Telehealth: Payer: Self-pay | Admitting: Registered Nurse

## 2017-02-06 NOTE — Telephone Encounter (Signed)
On 02/06/2017 the  [place] was reviewed no conflict was seen on the [place] with multiple prescribers. Ms. Shubert  has a signed narcotic contract with our office. If there were any discrepancies this would have been reported to her physician.

## 2017-02-06 NOTE — Telephone Encounter (Signed)
Placed a call to Alexandria Mclaughlin reviewed X-[place] results, she verbalizes understanding. Instructed no NSAID's history of CVA she verbalizes understanding.

## 2017-02-09 ENCOUNTER — Other Ambulatory Visit: Payer: Self-pay | Admitting: Physical Medicine & Rehabilitation

## 2017-02-10 ENCOUNTER — Encounter: Payer: PPO | Admitting: Physical Medicine & Rehabilitation

## 2017-03-09 ENCOUNTER — Ambulatory Visit (INDEPENDENT_AMBULATORY_CARE_PROVIDER_SITE_OTHER): Payer: Medicare HMO

## 2017-03-09 ENCOUNTER — Telehealth (INDEPENDENT_AMBULATORY_CARE_PROVIDER_SITE_OTHER): Payer: Self-pay | Admitting: Specialist

## 2017-03-09 ENCOUNTER — Encounter (INDEPENDENT_AMBULATORY_CARE_PROVIDER_SITE_OTHER): Payer: Self-pay | Admitting: Specialist

## 2017-03-09 ENCOUNTER — Ambulatory Visit (INDEPENDENT_AMBULATORY_CARE_PROVIDER_SITE_OTHER): Payer: Medicare HMO | Admitting: Specialist

## 2017-03-09 VITALS — BP 155/97 | HR 87 | Ht 63.0 in | Wt 140.0 lb

## 2017-03-09 DIAGNOSIS — M4807 Spinal stenosis, lumbosacral region: Secondary | ICD-10-CM

## 2017-03-09 DIAGNOSIS — M5136 Other intervertebral disc degeneration, lumbar region: Secondary | ICD-10-CM | POA: Diagnosis not present

## 2017-03-09 DIAGNOSIS — M5416 Radiculopathy, lumbar region: Secondary | ICD-10-CM | POA: Diagnosis not present

## 2017-03-09 DIAGNOSIS — M545 Low back pain: Secondary | ICD-10-CM

## 2017-03-09 DIAGNOSIS — M4316 Spondylolisthesis, lumbar region: Secondary | ICD-10-CM | POA: Diagnosis not present

## 2017-03-09 DIAGNOSIS — M48062 Spinal stenosis, lumbar region with neurogenic claudication: Secondary | ICD-10-CM

## 2017-03-09 MED ORDER — METHYLPREDNISOLONE 4 MG PO TABS
ORAL_TABLET | ORAL | 0 refills | Status: DC
Start: 2017-03-09 — End: 2017-03-23

## 2017-03-09 NOTE — Progress Notes (Signed)
Office Visit Note   Patient: Alexandria Mclaughlin           Date of Birth: [birth year]           MRN: [id number] Visit Date: 03/09/2017              Requested by: Merrilee Seashore, [address] [place], [place] [id number] PCP: Merrilee Seashore, MD   Assessment & Plan: Visit Diagnoses:  1. Bilateral low back pain, unspecified chronicity, with sciatica presence unspecified   2. Spondylolisthesis, lumbar region   3. Spinal stenosis of lumbar region with neurogenic claudication   4. Degenerative disc disease, lumbar   5. Spinal stenosis of lumbosacral region   6. Spondylolisthesis of lumbar region   7. Lumbar radiculopathy, right     Plan: Avoid bending, stooping and avoid lifting weights greater than 10 lbs. Avoid prolong standing and walking. Avoid frequent bending and stooping  No lifting greater than 10 lbs. May use ice or moist heat for pain. Weight loss is of benefit. Handicap license is approved. MRI on the lumbar spine with and without contrast is ordered. Take the medrol dose pak.  Follow-Up Instructions: Return in about 2 weeks (around 03/23/2017).   Orders:  Orders Placed This Encounter  Procedures  . XR Lumbar Spine 2-3 Views  . MR Lumbar Spine W Wo Contrast   Meds ordered this encounter  Medications  . methylPREDNISolone (MEDROL) 4 MG tablet    Sig: Take dose pak as directed.    Dispense:  21 tablet    Refill:  0      Procedures: No procedures performed   Clinical Data: No additional findings.   Subjective: Chief Complaint  Patient presents with  . Lower Back - Pain  . Right Leg - Pain    77 year old female seen today with increasing pain in her back and radiation into the right leg. She has    Review of Systems   Objective: Vital Signs: BP (!) 155/97 (BP Location: Left Arm, Patient Position: Sitting)   Pulse 87   Ht 5\' 3"  (1.6 m)   Wt 140 lb (63.5 kg)   BMI 24.80 kg/m   Physical Exam  Ortho Exam  Specialty  Comments:  No specialty comments available.  Imaging: Xr Lumbar Spine 2-3 Views  Result Date: 03/09/2017 Lumbar radiographs AP and lateral flexion and extension, show interbody fusion L5-S1, L4-5 grade 1 anterolisthesis with flexion 3-4 mm and extension reduces to 1 mm. Moderated DDD L4-5, mild L3-4    PMFS History: Patient Active Problem List   Diagnosis Date Noted  . Lumbar radicular pain 07/29/2016  . Myoclonus 09/19/2015  . Therapeutic opioid induced constipation 07/25/2015  . White matter changes 02/07/2015  . Essential hypertension 02/07/2015  . HLD (hyperlipidemia) 02/07/2015  . Low back pain 02/07/2015  . BPPV (benign paroxysmal positional vertigo) 02/07/2015  . Abnormality of gait 02/07/2015  . Lumbar facet arthropathy 07/05/2014  . Acquired unequal leg length on left 08/08/2011  . Lumbar post-laminectomy syndrome 06/11/2011  . Sacroiliac joint dysfunction 06/11/2011   Past Medical History:  Diagnosis Date  . Cervical facet syndrome   . Depression   . Disorders of sacrum   . Enthesopathy of hip region   . Headache   . Hypertension   . Sciatic neuritis   . Synovitis and tenosynovitis   . Vision abnormalities     Family History  Problem Relation Age of Onset  . Diabetes Mother   .  Heart disease Mother   . Stroke Mother   . Heart disease Father   . Heart disease Brother     Past Surgical History:  Procedure Laterality Date  . RETINAL DETACHMENT SURGERY  9/13   Social History   Occupational History  . Not on file  Tobacco Use  . Smoking status: Former Smoker    Packs/day: 0.50    Years: 5.00    Pack years: 2.50    Types: Cigarettes    Last attempt to quit: 03/15/1968    Years since quitting: 49.0  . Smokeless tobacco: Never Used  Substance and Sexual Activity  . Alcohol use: Yes    Alcohol/week: 0.6 oz    Types: 1 Glasses of wine per week    Comment: occasional glass of wine  . Drug use: No  . Sexual activity: Not on file

## 2017-03-09 NOTE — Telephone Encounter (Signed)
I put pt on cancellation list

## 2017-03-09 NOTE — Patient Instructions (Addendum)
Avoid bending, stooping and avoid lifting weights greater than 10 lbs. Avoid prolong standing and walking. Avoid frequent bending and stooping  No lifting greater than 10 lbs. May use ice or moist heat for pain. Weight loss is of benefit. Handicap license is approved. MRI on the lumbar spine with and without contrast is ordered. Take the medrol dose pak.

## 2017-03-09 NOTE — Telephone Encounter (Signed)
Could you put patient on a waitlist for Dr. Louanne Skye? He told her to follow up around 01/28 but hes booked until 02/13. If one opens, CB # [id number].

## 2017-03-15 ENCOUNTER — Encounter: Payer: Self-pay | Admitting: Physical Medicine & Rehabilitation

## 2017-03-19 ENCOUNTER — Ambulatory Visit
Admission: RE | Admit: 2017-03-19 | Discharge: 2017-03-19 | Disposition: A | Discharge status: Home / Self Care | Payer: Medicare HMO | Source: Ambulatory Visit | Attending: Specialist | Admitting: Specialist

## 2017-03-19 DIAGNOSIS — M4316 Spondylolisthesis, lumbar region: Secondary | ICD-10-CM

## 2017-03-19 DIAGNOSIS — M4807 Spinal stenosis, lumbosacral region: Secondary | ICD-10-CM

## 2017-03-19 DIAGNOSIS — M5116 Intervertebral disc disorders with radiculopathy, lumbar region: Secondary | ICD-10-CM | POA: Diagnosis not present

## 2017-03-19 MED ORDER — GADOBENATE DIMEGLUMINE 529 MG/ML IV SOLN
13.0000 mL | Freq: Once | INTRAVENOUS | Status: AC | PRN
  Administered 2017-03-19: 13 mL via INTRAVENOUS

## 2017-03-23 ENCOUNTER — Encounter (INDEPENDENT_AMBULATORY_CARE_PROVIDER_SITE_OTHER): Payer: Self-pay | Admitting: Specialist

## 2017-03-23 ENCOUNTER — Ambulatory Visit (INDEPENDENT_AMBULATORY_CARE_PROVIDER_SITE_OTHER): Payer: Medicare HMO | Admitting: Specialist

## 2017-03-23 VITALS — BP 159/88 | HR 78 | Ht 63.0 in | Wt 140.0 lb

## 2017-03-23 DIAGNOSIS — M5136 Other intervertebral disc degeneration, lumbar region: Secondary | ICD-10-CM | POA: Diagnosis not present

## 2017-03-23 DIAGNOSIS — M4316 Spondylolisthesis, lumbar region: Secondary | ICD-10-CM

## 2017-03-23 DIAGNOSIS — M48062 Spinal stenosis, lumbar region with neurogenic claudication: Secondary | ICD-10-CM | POA: Diagnosis not present

## 2017-03-23 NOTE — Progress Notes (Signed)
Office Visit Note   Patient: Alexandria Mclaughlin           Date of Birth: 25-May-1940           MRN: [id number] Visit Date: 03/23/2017              Requested by: Merrilee Seashore, [address] [place], [place] [address] PCP: Merrilee Seashore, MD   Assessment & Plan: Visit Diagnoses:  1. Spinal stenosis of lumbar region with neurogenic claudication   2. Spondylolisthesis of lumbar region   3. Degenerative disc disease, lumbar     Plan: Avoid bending, stooping and avoid lifting weights greater than 10 lbs. Avoid prolong standing and walking. Avoid frequent bending and stooping  No lifting greater than 10 lbs. May use ice or moist heat for pain. Weight loss is of benefit. Handicap license is approved.  Dr. Romona Curls secretary/Assistant will call to arrange for epidural steroid injection   Follow-Up Instructions: Return in about 3 weeks (around 04/13/2017).   Orders:  No orders of the defined types were placed in this encounter.  No orders of the defined types were placed in this encounter.     Procedures: No procedures performed   Clinical Data: No additional findings.   Subjective: Chief Complaint  Patient presents with  . Lower Back - Follow-up    MRI Review    77 year old female with history of L5-S1 fusion in [place], eventually removal of hardware by ourselves with persistent pain in her back and right side buttock and lower back.  No bowel or bladder difficulties. Sees Dr. Tessa Lerner in pain management at [facility] He ordered xrays of her right hip. She is starting to have pain with bending and reaching her socks and shoe, more difficulty with stiffness. She is avoiding stairs. When she first stands okay, it is mostly with sitting or lying that the pain is there. Worsening pain over the last several month. She notices weakness in the right leg that almost causes her to fall. The pain is becoming more frequent, over the last 2-3 months. She is taking  300 mg gabapentin at night, and oxycodone and fentanyl patch occasional clonazepam and duloxetine.     Review of Systems  Constitutional: Negative.   HENT: Negative.   Eyes: Negative.   Respiratory: Negative.   Cardiovascular: Negative.   Gastrointestinal: Negative.   Endocrine: Negative.   Genitourinary: Negative.   Musculoskeletal: Negative.   Skin: Negative.   Allergic/Immunologic: Negative.   Neurological: Negative.   Hematological: Negative.   Psychiatric/Behavioral: Negative.      Objective: Vital Signs: BP (!) 159/88 (BP Location: Left Arm, Patient Position: Sitting)   Pulse 78   Ht 5\' 3"  (1.6 m)   Wt 140 lb (63.5 kg)   BMI 24.80 kg/m   Physical Exam  Constitutional: She is oriented to person, place, and time. She appears well-developed and well-nourished.  HENT:  Head: Normocephalic and atraumatic.  Eyes: EOM are normal. Pupils are equal, round, and reactive to light.  Neck: Normal range of motion. Neck supple.  Pulmonary/Chest: Effort normal and breath sounds normal.  Abdominal: Soft. Bowel sounds are normal.  Neurological: She is alert and oriented to person, place, and time.  Skin: Skin is warm and dry.  Psychiatric: She has a normal mood and affect. Her behavior is normal. Judgment and thought content normal.    Back Exam   Tenderness  The patient is experiencing tenderness in the lumbar.  Range of Motion  Extension: abnormal  Flexion: normal  Lateral bend right: abnormal  Lateral bend left: abnormal  Rotation right: abnormal  Rotation left: abnormal   Muscle Strength  Right Quadriceps:  5/5  Left Quadriceps:  5/5  Right Hamstrings:  5/5  Left Hamstrings:  5/5   Tests  Straight leg raise right: negative Straight leg raise left: negative  Reflexes  Patellar: normal Achilles: abnormal Babinski's sign: normal   Other  Toe walk: normal Heel walk: abnormal Sensation: decreased Gait: drop-foot   Comments:  Right foot DF weakness,  3/5.      Specialty Comments:  No specialty comments available.  Imaging: No results found.   PMFS History: Patient Active Problem List   Diagnosis Date Noted  . Lumbar radicular pain 07/29/2016  . Myoclonus 09/19/2015  . Therapeutic opioid induced constipation 07/25/2015  . White matter changes 02/07/2015  . Essential hypertension 02/07/2015  . HLD (hyperlipidemia) 02/07/2015  . Low back pain 02/07/2015  . BPPV (benign paroxysmal positional vertigo) 02/07/2015  . Abnormality of gait 02/07/2015  . Lumbar facet arthropathy 07/05/2014  . Acquired unequal leg length on left 08/08/2011  . Lumbar post-laminectomy syndrome 06/11/2011  . Sacroiliac joint dysfunction 06/11/2011   Past Medical History:  Diagnosis Date  . Cervical facet syndrome   . Depression   . Disorders of sacrum   . Enthesopathy of hip region   . Headache   . Hypertension   . Sciatic neuritis   . Synovitis and tenosynovitis   . Vision abnormalities     Family History  Problem Relation Age of Onset  . Diabetes Mother   . Heart disease Mother   . Stroke Mother   . Heart disease Father   . Heart disease Brother     Past Surgical History:  Procedure Laterality Date  . RETINAL DETACHMENT SURGERY  9/13   Social History   Occupational History  . Not on file  Tobacco Use  . Smoking status: Former Smoker    Packs/day: 0.50    Years: 5.00    Pack years: 2.50    Types: Cigarettes    Last attempt to quit: 03/15/1968    Years since quitting: 49.0  . Smokeless tobacco: Never Used  Substance and Sexual Activity  . Alcohol use: Yes    Alcohol/week: 0.6 oz    Types: 1 Glasses of wine per week    Comment: occasional glass of wine  . Drug use: No  . Sexual activity: Not on file

## 2017-03-23 NOTE — Patient Instructions (Signed)
Avoid bending, stooping and avoid lifting weights greater than 10 lbs. Avoid prolong standing and walking. Avoid frequent bending and stooping  No lifting greater than 10 lbs. May use ice or moist heat for pain. Weight loss is of benefit. Handicap license is approved. Dr. Newton's secretary/Assistant will call to arrange for epidural steroid injection  

## 2017-04-08 ENCOUNTER — Encounter: Payer: Self-pay | Admitting: Registered Nurse

## 2017-04-08 ENCOUNTER — Other Ambulatory Visit: Payer: Self-pay

## 2017-04-08 ENCOUNTER — Ambulatory Visit (INDEPENDENT_AMBULATORY_CARE_PROVIDER_SITE_OTHER): Payer: Medicare HMO | Admitting: Specialist

## 2017-04-08 ENCOUNTER — Encounter: Payer: Medicare HMO | Attending: Physical Medicine and Rehabilitation | Admitting: Registered Nurse

## 2017-04-08 VITALS — BP 148/96 | HR 83

## 2017-04-08 DIAGNOSIS — Z79899 Other long term (current) drug therapy: Secondary | ICD-10-CM

## 2017-04-08 DIAGNOSIS — M533 Sacrococcygeal disorders, not elsewhere classified: Secondary | ICD-10-CM | POA: Diagnosis not present

## 2017-04-08 DIAGNOSIS — M47816 Spondylosis without myelopathy or radiculopathy, lumbar region: Secondary | ICD-10-CM

## 2017-04-08 DIAGNOSIS — M961 Postlaminectomy syndrome, not elsewhere classified: Secondary | ICD-10-CM

## 2017-04-08 DIAGNOSIS — Z5181 Encounter for therapeutic drug level monitoring: Secondary | ICD-10-CM

## 2017-04-08 DIAGNOSIS — M2170 Unequal limb length (acquired), unspecified site: Secondary | ICD-10-CM | POA: Diagnosis not present

## 2017-04-08 DIAGNOSIS — G894 Chronic pain syndrome: Secondary | ICD-10-CM | POA: Diagnosis not present

## 2017-04-08 DIAGNOSIS — M5416 Radiculopathy, lumbar region: Secondary | ICD-10-CM

## 2017-04-08 MED ORDER — OXYCODONE-ACETAMINOPHEN 10-325 MG PO TABS: 1.0000 | ORAL_TABLET | Freq: Four times a day (QID) | ORAL | 0 refills | Status: DC | PRN

## 2017-04-08 MED ORDER — FENTANYL 25 MCG/HR TD PT72: 25.0000 ug | MEDICATED_PATCH | TRANSDERMAL | 0 refills | Status: DC

## 2017-04-08 NOTE — Progress Notes (Signed)
Subjective:    Patient ID: Alexandria Mclaughlin, female    DOB: 21-Dec-1940, 77 y.o.   MRN: [id number]  HPI: Alexandria Mclaughlin is a 77 year old female who returns for follow up appointment  for chronic pain and medication refill. She states her pain is located in her lower back radiating into her right lower extremity. She rates her pain 6. Her exercise regimen has been placed on hold by Dr. Louanne Skye she reports. Also reports her lower back pain has increased in intensity and Dr. Louanne Skye has been following, she had a Lumbar MRI on 03/19/2017. I reviewed Dr. Louanne Skye note from 03/23/2017. She is scheduled for ESI with Dr. Ernestina Patches on 04/14/2017 IMPRESSION: 1. Chronic decompression and fusion at L5-S1 with solid arthrodesis. 2. Severe adjacent segment disease at L4-L5 with progression since the 2010 MRI, including new mild grade 1 anterolisthesis. Severe multifactorial spinal, and left greater than right lateral recess stenosis. Increased, up to moderate bilateral L4 neural foraminal stenosis. Associated acute degenerative endplate marrow edema. 3. Mild progression of L2-L3 and L3-L4 degeneration since 2010. There is associated mild left L3 neural foramina and mild to moderate left L4 level lateral recess stenosis. [name] states with the increase intensity of pain the oxycodone only relieves her pain for 4 hours when the pain is severe and 6 hours when she is experience moderate pain  on the other days. We will change her oxycodone frequency to  Q6 hours as needed and increase her tablets to 110, she was instructed to call office with any questions and concerns she verbalizes understanding. We will evaluate on next scheduled visit, she verbalizes understanding.   Ms. Son Morphine equivalent is 103.00 MME.  She  is also prescribed Klonopin. .We have reviewed the black box warning of using opioids and benzodiazepines. I highlighted the dangers of using these drugs together and discussed the adverse events including  respiratory suppression, overdose, cognitive impairment and importance of  compliance with current regimen. She verbalizes understanding, we will continue to monitor and adjust as indicated.    Pain Inventory Average Pain 6 Pain Right Now 6 My pain is constant, sharp and stabbing  In the last 24 hours, has pain interfered with the following? General activity 6 Relation with others 6 Enjoyment of life 6 What TIME of day is your pain at its worst? daytime Sleep (in general) Good  Pain is worse with: sitting and standing Pain improves with: rest, heat/ice and medication Relief from Meds: 7  Mobility walk without assistance do you drive?  yes Do you have any goals in this area?  no  Function retired Do you have any goals in this area?  no  Neuro/Psych weakness  Prior Studies Any changes since last visit?  no  Physicians involved in your care Any changes since last visit?  no   Family History  Problem Relation Age of Onset  . Diabetes Mother   . Heart disease Mother   . Stroke Mother   . Heart disease Father   . Heart disease Brother    Social History   Socioeconomic History  . Marital status: Widowed    Spouse name: Not on file  . Number of children: Not on file  . Years of education: Not on file  . Highest education level: Not on file  Social Needs  . Financial resource strain: Not on file  . Food insecurity - worry: Not on file  . Food insecurity - inability: Not on file  .  Transportation needs - medical: Not on file  . Transportation needs - non-medical: Not on file  Occupational History  . Not on file  Tobacco Use  . Smoking status: Former Smoker    Packs/day: 0.50    Years: 5.00    Pack years: 2.50    Types: Cigarettes    Last attempt to quit: 03/15/1968    Years since quitting: 49.0  . Smokeless tobacco: Never Used  Substance and Sexual Activity  . Alcohol use: Yes    Alcohol/week: 0.6 oz    Types: 1 Glasses of wine per week    Comment:  occasional glass of wine  . Drug use: No  . Sexual activity: Not on file  Other Topics Concern  . Not on file  Social History Narrative  . Not on file   Past Surgical History:  Procedure Laterality Date  . RETINAL DETACHMENT SURGERY  9/13   Past Medical History:  Diagnosis Date  . Cervical facet syndrome   . Depression   . Disorders of sacrum   . Enthesopathy of hip region   . Headache   . Hypertension   . Sciatic neuritis   . Synovitis and tenosynovitis   . Vision abnormalities    BP (!) 148/96   Pulse 83   SpO2 94%   Opioid Risk Score:  1 Fall Risk Score:  `1  Depression screen PHQ 2/9  Depression screen [facility] 2/9 04/08/2017 10/22/2015 05/23/2015 03/20/2015 02/21/2015 11/09/2014 09/13/2014  Decreased Interest 0 0 0 0 1 0 3  Down, Depressed, Hopeless 0 0 0 0 0 0 0  PHQ - 2 Score 0 0 0 0 1 0 3  Altered sleeping - - 0 - - - -  Tired, decreased energy - - 3 - - - -  Change in appetite - - 3 - - - -  Feeling bad or failure about yourself  - - 0 - - - -  Trouble concentrating - - 0 - - - -  Moving slowly or fidgety/restless - - 0 - - - -  Suicidal thoughts - - 0 - - - -  PHQ-9 Score - - 6 - - - -   Review of Systems  Constitutional: Positive for appetite change.  HENT: Negative.   Eyes: Negative.   Respiratory: Negative.   Cardiovascular: Negative.   Gastrointestinal: Negative.   Endocrine: Negative.   Genitourinary: Negative.   Musculoskeletal: Positive for back pain and myalgias.  Skin: Negative.   Allergic/Immunologic: Negative.   Neurological: Positive for weakness.  Hematological: Negative.   Psychiatric/Behavioral: Negative.   All other systems reviewed and are negative.      Objective:   Physical Exam  Constitutional: She is oriented to person, place, and time. She appears well-developed and well-nourished.  HENT:  Head: Normocephalic and atraumatic.  Neck: Normal range of motion. Neck supple.  Cardiovascular: Normal rate and regular rhythm.    Pulmonary/Chest: Effort normal and breath sounds normal.  Musculoskeletal:  Normal Muscle Bulk and Muscle Testing Reveals: Upper Extremities: Full ROM and Muscle Strength 5/5 Lumbar Paraspinal Tenderness: L-3-L-5 Sacral Tenderness Right Greater Trochanter Tenderness Lower Extremities: Full ROM and Muscle Strength 5/5 Arises from Table with ease Narrow Based Gait  Neurological: She is alert and oriented to person, place, and time.  Skin: Skin is warm and dry.  Psychiatric: She has a normal mood and affect.  Nursing note and vitals reviewed.         Assessment & Plan:  1. Post  laminectomy syndrome with right buttock pain Right Lumbar Radiculitis/ Lumbar Facet Arthropathy/ Sacroiliac Joint Dysfunction: 04/08/2017 Refilled:Fentanyl 25 mcg patch  # 10- one patch every 3 days and  Increased  Percocet 10/325 mg one every 6 hours as needed  #110. Second script of given for the following month. We will continue the opioid monitoring program, this consists of regular clinic visits, examinations, urine drug screen, pill counts as well as use of [place] Controlled Substance Reporting System. 2. Lumbar Radicular  Pain: Continue Gabapentin and Cymbalta. 04/08/2017 3. Gait Disorder: Continue HEP / Neurology Following. 04/08/2017 4. Myoclonus with sleep: Continue Klonopin. 04/08/2017   40 minutes of face to face patient care time was spent during this visit. All questions were encouraged and answered.   F/U in 2 months

## 2017-04-14 ENCOUNTER — Ambulatory Visit (INDEPENDENT_AMBULATORY_CARE_PROVIDER_SITE_OTHER): Payer: Medicare HMO

## 2017-04-14 ENCOUNTER — Ambulatory Visit (INDEPENDENT_AMBULATORY_CARE_PROVIDER_SITE_OTHER): Payer: Medicare HMO | Admitting: Physical Medicine and Rehabilitation

## 2017-04-14 ENCOUNTER — Encounter (INDEPENDENT_AMBULATORY_CARE_PROVIDER_SITE_OTHER): Payer: Self-pay | Admitting: Physical Medicine and Rehabilitation

## 2017-04-14 VITALS — BP 170/98 | HR 71 | Temp 98.2°F

## 2017-04-14 DIAGNOSIS — M5416 Radiculopathy, lumbar region: Secondary | ICD-10-CM

## 2017-04-14 DIAGNOSIS — M961 Postlaminectomy syndrome, not elsewhere classified: Secondary | ICD-10-CM

## 2017-04-14 MED ORDER — BETAMETHASONE SOD PHOS & ACET 6 (3-3) MG/ML IJ SUSP
12.0000 mg | Freq: Once | INTRAMUSCULAR | Status: AC
Start: 2017-04-14 — End: 2017-04-14
  Administered 2017-04-14: 12 mg

## 2017-04-14 NOTE — Procedures (Signed)
Lumbosacral Transforaminal Epidural Steroid Injection - Sub-Pedicular Approach with Fluoroscopic Guidance  Patient: Alexandria Mclaughlin      Date of Birth: [birth year] MRN: [id number] PCP: Merrilee Seashore, MD      Visit Date: 04/14/2017   Universal Protocol:    Date/Time: 04/14/2017  Consent Given By: the patient  Position: PRONE  Additional Comments: Vital signs were monitored before and after the procedure. Patient was prepped and draped in the usual sterile fashion. The correct patient, procedure, and site was verified.   Injection Procedure Details:  Procedure Site One Meds Administered:  Meds ordered this encounter  Medications  . betamethasone acetate-betamethasone sodium phosphate (CELESTONE) injection 12 mg    Laterality: Right  Location/Site:  L4-L5  Needle size: 22 G  Needle type: Spinal  Needle Placement: Transforaminal  Findings:    -Comments: Excellent flow of contrast along the nerve and into the epidural space.  Procedure Details: After squaring off the end-plates to get a true AP view, the C-arm was positioned so that an oblique view of the foramen as noted above was visualized. The target area is just inferior to the "nose of the scotty dog" or sub pedicular. The soft tissues overlying this structure were infiltrated with 2-3 ml. of 1% Lidocaine without Epinephrine.  The spinal needle was inserted toward the target using a "trajectory" view along the fluoroscope beam.  Under AP and lateral visualization, the needle was advanced so it did not puncture dura and was located close the 6 O'Clock position of the pedical in AP tracterory. Biplanar projections were used to confirm position. Aspiration was confirmed to be negative for CSF and/or blood. A 1-2 ml. volume of Isovue-250 was injected and flow of contrast was noted at each level. Radiographs were obtained for documentation purposes.   After attaining the desired flow of contrast documented above, a 0.5 to  1.0 ml test dose of 0.25% Marcaine was injected into each respective transforaminal space.  The patient was observed for 90 seconds post injection.  After no sensory deficits were reported, and normal lower extremity motor function was noted,   the above injectate was administered so that equal amounts of the injectate were placed at each foramen (level) into the transforaminal epidural space.   Additional Comments:  The patient tolerated the procedure well Dressing: Band-Aid    Post-procedure details: Patient was observed during the procedure. Post-procedure instructions were reviewed.  Patient left the clinic in stable condition.

## 2017-04-14 NOTE — Progress Notes (Deleted)
Pt states pain in lower back mostly on the right side that radiates down right leg. Pt states pain has been going on for about 3 months. Pt states walking, sitting, and standing for any length of time makes pain worse. Pt states laying down to rest with pain medication makes pain better. +Driver, -Bt, -Dye allergies.

## 2017-04-14 NOTE — Progress Notes (Signed)
Alexandria Mclaughlin - 77 y.o. female MRN [id number]  Date of birth: 1940/05/30  Office Visit Note: Visit Date: 04/14/2017 PCP: Merrilee Seashore, MD Referred by: Merrilee Seashore, MD  Subjective: Chief Complaint  Patient presents with  . Lower Back - Pain  . Right Leg - Pain   HPI: Alexandria Mclaughlin is a 77 year old female with prior lumbar fusion at L5-S1 now with right radicular leg complaints for about 3 months.  MRI showing changes at L4-5 with listhesis.  She comes in today at the request of Dr. Louanne Skye for diagnostic and hopefully  therapeutic right L4 transforaminal epidural steroid injection.    ROS Otherwise per HPI.  Assessment & Plan: Visit Diagnoses:  1. Lumbar radiculopathy   2. Post laminectomy syndrome     Plan: Findings:  Right L4 transforaminal epidural steroid injection.    Meds & Orders:  Meds ordered this encounter  Medications  . betamethasone acetate-betamethasone sodium phosphate (CELESTONE) injection 12 mg    Orders Placed This Encounter  Procedures  . XR C-ARM NO REPORT  . Epidural Steroid injection    Follow-up: Return for Dr. Louanne Skye.   Procedures: No procedures performed  Lumbosacral Transforaminal Epidural Steroid Injection - Sub-Pedicular Approach with Fluoroscopic Guidance  Patient: Alexandria Mclaughlin      Date of Birth: 1940/07/31 MRN: [id number] PCP: Merrilee Seashore, MD      Visit Date: 04/14/2017   Universal Protocol:    Date/Time: 04/14/2017  Consent Given By: the patient  Position: PRONE  Additional Comments: Vital signs were monitored before and after the procedure. Patient was prepped and draped in the usual sterile fashion. The correct patient, procedure, and site was verified.   Injection Procedure Details:  Procedure Site One Meds Administered:  Meds ordered this encounter  Medications  . betamethasone acetate-betamethasone sodium phosphate (CELESTONE) injection 12 mg    Laterality: Right  Location/Site:  L4-L5  Needle  size: 22 G  Needle type: Spinal  Needle Placement: Transforaminal  Findings:    -Comments: Excellent flow of contrast along the nerve and into the epidural space.  Procedure Details: After squaring off the end-plates to get a true AP view, the C-arm was positioned so that an oblique view of the foramen as noted above was visualized. The target area is just inferior to the "nose of the scotty dog" or sub pedicular. The soft tissues overlying this structure were infiltrated with 2-3 ml. of 1% Lidocaine without Epinephrine.  The spinal needle was inserted toward the target using a "trajectory" view along the fluoroscope beam.  Under AP and lateral visualization, the needle was advanced so it did not puncture dura and was located close the 6 O'Clock position of the pedical in AP tracterory. Biplanar projections were used to confirm position. Aspiration was confirmed to be negative for CSF and/or blood. A 1-2 ml. volume of Isovue-250 was injected and flow of contrast was noted at each level. Radiographs were obtained for documentation purposes.   After attaining the desired flow of contrast documented above, a 0.5 to 1.0 ml test dose of 0.25% Marcaine was injected into each respective transforaminal space.  The patient was observed for 90 seconds post injection.  After no sensory deficits were reported, and normal lower extremity motor function was noted,   the above injectate was administered so that equal amounts of the injectate were placed at each foramen (level) into the transforaminal epidural space.   Additional Comments:  The patient tolerated the procedure well Dressing: Band-Aid  Post-procedure details: Patient was observed during the procedure. Post-procedure instructions were reviewed.  Patient left the clinic in stable condition.    Clinical History: MRI LUMBAR SPINE WITHOUT AND WITH CONTRAST  TECHNIQUE: Multiplanar and multiecho pulse sequences of the lumbar spine  were obtained without and with intravenous contrast.  CONTRAST:  72mL MULTIHANCE GADOBENATE DIMEGLUMINE 529 MG/ML IV SOLN  COMPARISON:  Lumbar MRI 12/27/2008. CT Abdomen and Pelvis 05/10/2014.  FINDINGS: Segmentation: Seen to be normal on the 2016 CT, which is the same numbering system used on the 2010 MRI.  Alignment: Mild grade 1 anterolisthesis of L4 on L5 is new or increased since 2010. Stable vertebral height and alignment elsewhere.  Vertebrae: Chronic postoperative changes at L5-S1. Bilateral pedicle screws at that level have been removed since the 2010 MRI. Postoperative changes to the right iliac wing also visible. Intact visible sacrum and SI joints.  There is degenerative appearing endplate marrow edema at L4-L5 eccentric to the right. A small benign vertebral body hemangioma which is round with heterogeneous intrinsic T1 signal has also developed since 2010 (series 4, image 6) there is a similar but chronic benign vertebral body hemangioma posteriorly at L3 on the left. Stable and negative bone marrow signal elsewhere. No other marrow edema or acute osseous abnormality.  Conus medullaris and cauda equina: Conus extends to the T12-L1 level. Conus and cauda equina appear normal aside from spinal stenosis at L4-L5. No abnormal intradural enhancement.  Paraspinal and other soft tissues: Postoperative changes to the lower lumbar posterior paraspinal soft tissues. Stable and negative visible abdominal viscera.  Disc levels:  The lower thoracic levels through L1-L2 are stable and unremarkable for age.  L2-L3: Mild to moderate facet hypertrophy is stable to mildly progressed since 2010. There is new trace facet joint fluid on the left. Mild disc bulge. No stenosis.  L3-L4: Increased circumferential disc bulging eccentric to the left with broad-based left subarticular component. Moderate facet and ligament flavum hypertrophy is mildly increased.  Increased mild to moderate left lateral recess stenosis (descending left L4 nerve level). New mild left L3 neural foraminal stenosis. No spinal stenosis.  L4-L5: Chronic adjacent segment disease, but increased disc space loss (new vacuum disc), left eccentric circumferential disc bulging with broad-based posterior and biforaminal components of disc, and increased severe facet and ligament flavum hypertrophy since 20/10. Subsequent increased and now severe spinal and left greater than right lateral recess stenosis (descending L5 nerve levels). There is associated increased mild to moderate bilateral L4 neural foraminal stenosis, greater on the right.  L5-S1: Chronic decompression and fusion with solid arthrodesis. Mild osseous overgrowth at both neural foramina was better demonstrated on the 2016 CT and is greater on the left.  IMPRESSION: 1. Chronic decompression and fusion at L5-S1 with solid arthrodesis. 2. Severe adjacent segment disease at L4-L5 with progression since the 2010 MRI, including new mild grade 1 anterolisthesis. Severe multifactorial spinal, and left greater than right lateral recess stenosis. Increased, up to moderate bilateral L4 neural foraminal stenosis. Associated acute degenerative endplate marrow edema. 3. Mild progression of L2-L3 and L3-L4 degeneration since 2010. There is associated mild left L3 neural foramina and mild to moderate left L4 level lateral recess stenosis.   Electronically Signed   By: Genevie Ann M.D.   On: 03/19/2017 15:14  She reports that she quit smoking about 49 years ago. Her smoking use included cigarettes. She has a 2.50 pack-year smoking history. she has never used smokeless tobacco. No results for input(s): HGBA1C, LABURIC in  the last 8760 hours.  Objective:  VS:  HT:    WT:   BMI:     BP:(!) 170/98  HR:71bpm  TEMP:98.2 F (36.8 C)(Oral)  RESP:94 % Physical Exam  Ortho Exam Imaging: No results found.  Past  Medical/Family/Surgical/Social History: Medications & Allergies reviewed per EMR Patient Active Problem List   Diagnosis Date Noted  . Lumbar radicular pain 07/29/2016  . Myoclonus 09/19/2015  . Therapeutic opioid induced constipation 07/25/2015  . White matter changes 02/07/2015  . Essential hypertension 02/07/2015  . HLD (hyperlipidemia) 02/07/2015  . Low back pain 02/07/2015  . BPPV (benign paroxysmal positional vertigo) 02/07/2015  . Abnormality of gait 02/07/2015  . Lumbar facet arthropathy 07/05/2014  . Acquired unequal leg length on left 08/08/2011  . Lumbar post-laminectomy syndrome 06/11/2011  . Sacroiliac joint dysfunction 06/11/2011   Past Medical History:  Diagnosis Date  . Cervical facet syndrome   . Depression   . Disorders of sacrum   . Enthesopathy of hip region   . Headache   . Hypertension   . Sciatic neuritis   . Synovitis and tenosynovitis   . Vision abnormalities    Family History  Problem Relation Age of Onset  . Diabetes Mother   . Heart disease Mother   . Stroke Mother   . Heart disease Father   . Heart disease Brother    Past Surgical History:  Procedure Laterality Date  . RETINAL DETACHMENT SURGERY  9/13   Social History   Occupational History  . Not on file  Tobacco Use  . Smoking status: Former Smoker    Packs/day: 0.50    Years: 5.00    Pack years: 2.50    Types: Cigarettes    Last attempt to quit: 03/15/1968    Years since quitting: 49.1  . Smokeless tobacco: Never Used  Substance and Sexual Activity  . Alcohol use: Yes    Alcohol/week: 0.6 oz    Types: 1 Glasses of wine per week    Comment: occasional glass of wine  . Drug use: No  . Sexual activity: Not on file

## 2017-04-14 NOTE — Patient Instructions (Signed)

## 2017-04-17 DIAGNOSIS — H353131 Nonexudative age-related macular degeneration, bilateral, early dry stage: Secondary | ICD-10-CM | POA: Diagnosis not present

## 2017-04-17 DIAGNOSIS — H40023 Open angle with borderline findings, high risk, bilateral: Secondary | ICD-10-CM | POA: Diagnosis not present

## 2017-04-17 DIAGNOSIS — H43813 Vitreous degeneration, bilateral: Secondary | ICD-10-CM | POA: Diagnosis not present

## 2017-04-17 DIAGNOSIS — Z961 Presence of intraocular lens: Secondary | ICD-10-CM | POA: Diagnosis not present

## 2017-04-20 ENCOUNTER — Ambulatory Visit (INDEPENDENT_AMBULATORY_CARE_PROVIDER_SITE_OTHER): Payer: Medicare HMO | Admitting: Specialist

## 2017-04-23 ENCOUNTER — Encounter: Payer: Self-pay | Admitting: Physical Medicine & Rehabilitation

## 2017-04-24 ENCOUNTER — Other Ambulatory Visit: Payer: Self-pay

## 2017-04-24 MED ORDER — DULOXETINE HCL 30 MG PO CPEP: ORAL_CAPSULE | ORAL | 3 refills | Status: DC

## 2017-04-29 ENCOUNTER — Other Ambulatory Visit: Payer: Self-pay | Admitting: Physical Medicine & Rehabilitation

## 2017-04-29 DIAGNOSIS — G253 Myoclonus: Secondary | ICD-10-CM

## 2017-04-30 ENCOUNTER — Ambulatory Visit (INDEPENDENT_AMBULATORY_CARE_PROVIDER_SITE_OTHER): Payer: Medicare HMO | Admitting: Specialist

## 2017-04-30 ENCOUNTER — Ambulatory Visit (INDEPENDENT_AMBULATORY_CARE_PROVIDER_SITE_OTHER): Payer: Medicare HMO | Admitting: Surgery

## 2017-04-30 ENCOUNTER — Encounter (INDEPENDENT_AMBULATORY_CARE_PROVIDER_SITE_OTHER): Payer: Self-pay | Admitting: Specialist

## 2017-04-30 VITALS — BP 192/96 | HR 86 | Ht 63.0 in | Wt 140.0 lb

## 2017-04-30 DIAGNOSIS — M5136 Other intervertebral disc degeneration, lumbar region: Secondary | ICD-10-CM | POA: Diagnosis not present

## 2017-04-30 DIAGNOSIS — M48062 Spinal stenosis, lumbar region with neurogenic claudication: Secondary | ICD-10-CM | POA: Diagnosis not present

## 2017-04-30 NOTE — Patient Instructions (Signed)
Avoid bending, stooping and avoid lifting weights greater than 10 lbs. Avoid prolong standing and walking. Avoid frequent bending and stooping  No lifting greater than 10 lbs. May use ice or moist heat for pain. Weight loss is of benefit. Handicap license is approved. Call if the pain worsens and you wish to consider a further. Dr. Romona Curls secretary/Assistant will call to arrange for epidural steroid injection.

## 2017-04-30 NOTE — Progress Notes (Signed)
Office Visit Note   Patient: Alexandria Mclaughlin           Date of Birth: 04-Sep-1940           MRN: [id number] Visit Date: 04/30/2017              Requested by: Merrilee Seashore, [address] [place], [place] [address] PCP: Merrilee Seashore, MD   Assessment & Plan: Visit Diagnoses:  1. Spinal stenosis of lumbar region with neurogenic claudication     Plan: Avoid bending, stooping and avoid lifting weights greater than 10 lbs. Avoid prolong standing and walking. Avoid frequent bending and stooping  No lifting greater than 10 lbs. May use ice or moist heat for pain. Weight loss is of benefit. Handicap license is approved. Call if the pain worsens and you wish to consider a further. Dr. Romona Curls secretary/Assistant will call to arrange for epidural steroid injection.  Follow-Up Instructions: No Follow-up on file.   Orders:  No orders of the defined types were placed in this encounter.  No orders of the defined types were placed in this encounter.     Procedures: No procedures performed   Clinical Data: No additional findings.   Subjective: Chief Complaint  Patient presents with  . Lower Back - Follow-up    77 year old female with adjacent level lumbar spinal stenosis at L4-5 above the L5-S1 fusion. She has seen improvement with ESI and understands the condition.     Review of Systems  Constitutional: Negative.   HENT: Negative.   Eyes: Negative.   Respiratory: Negative.   Cardiovascular: Negative.   Gastrointestinal: Negative.   Endocrine: Negative.   Genitourinary: Negative.   Musculoskeletal: Negative.   Skin: Negative.   Allergic/Immunologic: Negative.   Neurological: Negative.   Hematological: Negative.   Psychiatric/Behavioral: Negative.      Objective: Vital Signs: BP (!) 192/96 (BP Location: Left Arm, Patient Position: Sitting)   Pulse 86   Ht 5\' 3"  (1.6 m)   Wt 140 lb (63.5 kg)   BMI 24.80 kg/m   Physical Exam    Constitutional: She is oriented to person, place, and time. She appears well-developed and well-nourished.  HENT:  Head: Normocephalic and atraumatic.  Eyes: EOM are normal. Pupils are equal, round, and reactive to light.  Neck: Normal range of motion. Neck supple.  Pulmonary/Chest: Effort normal and breath sounds normal.  Abdominal: Soft. Bowel sounds are normal.  Neurological: She is alert and oriented to person, place, and time.  Skin: Skin is warm and dry.  Psychiatric: She has a normal mood and affect. Her behavior is normal. Judgment and thought content normal.    Back Exam   Range of Motion  Extension: abnormal  Lateral bend right: abnormal  Lateral bend left: abnormal  Rotation right: abnormal  Rotation left: abnormal   Muscle Strength  Right Quadriceps:  5/5  Left Quadriceps:  5/5  Right Hamstrings:  5/5  Left Hamstrings:  5/5   Tests  Straight leg raise right: negative Straight leg raise left: negative  Reflexes  Patellar: normal Achilles: normal Biceps: normal Babinski's sign: normal   Other  Toe walk: normal Heel walk: normal Sensation: normal Gait: normal  Erythema: no back redness Scars: absent  Comments:  Right L4-5.      Specialty Comments:  No specialty comments available.  Imaging: No results found.   PMFS History: Patient Active Problem List   Diagnosis Date Noted  . Lumbar radicular pain 07/29/2016  .  Myoclonus 09/19/2015  . Therapeutic opioid induced constipation 07/25/2015  . White matter changes 02/07/2015  . Essential hypertension 02/07/2015  . HLD (hyperlipidemia) 02/07/2015  . Low back pain 02/07/2015  . BPPV (benign paroxysmal positional vertigo) 02/07/2015  . Abnormality of gait 02/07/2015  . Lumbar facet arthropathy 07/05/2014  . Acquired unequal leg length on left 08/08/2011  . Lumbar post-laminectomy syndrome 06/11/2011  . Sacroiliac joint dysfunction 06/11/2011   Past Medical History:  Diagnosis Date  .  Cervical facet syndrome   . Depression   . Disorders of sacrum   . Enthesopathy of hip region   . Headache   . Hypertension   . Sciatic neuritis   . Synovitis and tenosynovitis   . Vision abnormalities     Family History  Problem Relation Age of Onset  . Diabetes Mother   . Heart disease Mother   . Stroke Mother   . Heart disease Father   . Heart disease Brother     Past Surgical History:  Procedure Laterality Date  . RETINAL DETACHMENT SURGERY  9/13   Social History   Occupational History  . Not on file  Tobacco Use  . Smoking status: Former Smoker    Packs/day: 0.50    Years: 5.00    Pack years: 2.50    Types: Cigarettes    Last attempt to quit: 03/15/1968    Years since quitting: 49.1  . Smokeless tobacco: Never Used  Substance and Sexual Activity  . Alcohol use: Yes    Alcohol/week: 0.6 oz    Types: 1 Glasses of wine per week    Comment: occasional glass of wine  . Drug use: No  . Sexual activity: Not on file

## 2017-05-04 DIAGNOSIS — R69 Illness, unspecified: Secondary | ICD-10-CM | POA: Diagnosis not present

## 2017-05-12 DIAGNOSIS — I10 Essential (primary) hypertension: Secondary | ICD-10-CM | POA: Diagnosis not present

## 2017-05-12 DIAGNOSIS — N390 Urinary tract infection, site not specified: Secondary | ICD-10-CM | POA: Diagnosis not present

## 2017-05-12 DIAGNOSIS — E0590 Thyrotoxicosis, unspecified without thyrotoxic crisis or storm: Secondary | ICD-10-CM | POA: Diagnosis not present

## 2017-05-12 DIAGNOSIS — Z23 Encounter for immunization: Secondary | ICD-10-CM | POA: Diagnosis not present

## 2017-05-12 DIAGNOSIS — Z0000 Encounter for general adult medical examination without abnormal findings: Secondary | ICD-10-CM | POA: Diagnosis not present

## 2017-05-12 DIAGNOSIS — K2970 Gastritis, unspecified, without bleeding: Secondary | ICD-10-CM | POA: Diagnosis not present

## 2017-05-12 DIAGNOSIS — E782 Mixed hyperlipidemia: Secondary | ICD-10-CM | POA: Diagnosis not present

## 2017-05-15 ENCOUNTER — Telehealth: Payer: Self-pay | Admitting: *Deleted

## 2017-05-15 DIAGNOSIS — M47816 Spondylosis without myelopathy or radiculopathy, lumbar region: Secondary | ICD-10-CM

## 2017-05-15 DIAGNOSIS — M961 Postlaminectomy syndrome, not elsewhere classified: Secondary | ICD-10-CM

## 2017-05-15 DIAGNOSIS — M533 Sacrococcygeal disorders, not elsewhere classified: Secondary | ICD-10-CM

## 2017-05-15 MED ORDER — FENTANYL 25 MCG/HR TD PT72: 25.0000 ug | MEDICATED_PATCH | TRANSDERMAL | 0 refills | Status: DC

## 2017-05-15 NOTE — Telephone Encounter (Signed)
Wadie called and wanted Alexandria Mclaughlin to know she is going to be out of her Fentanyl tomorrow and her regular pharmacy will not get it until Monday.  She is requesting that Alexandria Mclaughlin send it to [facility] on [address].

## 2017-05-15 NOTE — Telephone Encounter (Signed)
Placed a call to [facility], the have the Fentanyl Patch in stock. Called [facility], they remove the Fentanyl prescription. Fentanyl prescription e-scribe to [facility]. Ms. Proffit aware of the above.

## 2017-05-19 DIAGNOSIS — I10 Essential (primary) hypertension: Secondary | ICD-10-CM | POA: Diagnosis not present

## 2017-05-19 DIAGNOSIS — E0590 Thyrotoxicosis, unspecified without thyrotoxic crisis or storm: Secondary | ICD-10-CM | POA: Diagnosis not present

## 2017-05-19 DIAGNOSIS — R69 Illness, unspecified: Secondary | ICD-10-CM | POA: Diagnosis not present

## 2017-05-19 DIAGNOSIS — E782 Mixed hyperlipidemia: Secondary | ICD-10-CM | POA: Diagnosis not present

## 2017-05-19 DIAGNOSIS — G894 Chronic pain syndrome: Secondary | ICD-10-CM | POA: Diagnosis not present

## 2017-05-21 ENCOUNTER — Other Ambulatory Visit: Payer: Self-pay | Admitting: Internal Medicine

## 2017-05-21 DIAGNOSIS — E0590 Thyrotoxicosis, unspecified without thyrotoxic crisis or storm: Secondary | ICD-10-CM

## 2017-05-25 ENCOUNTER — Ambulatory Visit (INDEPENDENT_AMBULATORY_CARE_PROVIDER_SITE_OTHER): Payer: Medicare HMO | Admitting: Specialist

## 2017-05-26 ENCOUNTER — Ambulatory Visit
Admission: RE | Admit: 2017-05-26 | Discharge: 2017-05-26 | Disposition: A | Discharge status: Home / Self Care | Payer: Medicare HMO | Source: Ambulatory Visit | Attending: Internal Medicine | Admitting: Internal Medicine

## 2017-05-26 DIAGNOSIS — E0590 Thyrotoxicosis, unspecified without thyrotoxic crisis or storm: Secondary | ICD-10-CM

## 2017-05-26 DIAGNOSIS — E041 Nontoxic single thyroid nodule: Secondary | ICD-10-CM | POA: Diagnosis not present

## 2017-05-28 DIAGNOSIS — E041 Nontoxic single thyroid nodule: Secondary | ICD-10-CM | POA: Diagnosis not present

## 2017-06-01 ENCOUNTER — Other Ambulatory Visit: Payer: Self-pay | Admitting: Internal Medicine

## 2017-06-01 DIAGNOSIS — E041 Nontoxic single thyroid nodule: Secondary | ICD-10-CM

## 2017-06-03 ENCOUNTER — Other Ambulatory Visit: Payer: Self-pay

## 2017-06-03 ENCOUNTER — Encounter: Payer: Medicare HMO | Attending: Physical Medicine and Rehabilitation | Admitting: Physical Medicine & Rehabilitation

## 2017-06-03 ENCOUNTER — Encounter: Payer: Self-pay | Admitting: Physical Medicine & Rehabilitation

## 2017-06-03 ENCOUNTER — Other Ambulatory Visit: Payer: Self-pay | Admitting: Physical Medicine & Rehabilitation

## 2017-06-03 VITALS — BP 137/87 | HR 74 | Ht 62.0 in | Wt 144.0 lb

## 2017-06-03 DIAGNOSIS — Z5181 Encounter for therapeutic drug level monitoring: Secondary | ICD-10-CM | POA: Diagnosis not present

## 2017-06-03 DIAGNOSIS — M2170 Unequal limb length (acquired), unspecified site: Secondary | ICD-10-CM | POA: Diagnosis not present

## 2017-06-03 DIAGNOSIS — M961 Postlaminectomy syndrome, not elsewhere classified: Secondary | ICD-10-CM | POA: Diagnosis not present

## 2017-06-03 DIAGNOSIS — M533 Sacrococcygeal disorders, not elsewhere classified: Secondary | ICD-10-CM | POA: Diagnosis not present

## 2017-06-03 DIAGNOSIS — G894 Chronic pain syndrome: Secondary | ICD-10-CM | POA: Diagnosis not present

## 2017-06-03 DIAGNOSIS — M47816 Spondylosis without myelopathy or radiculopathy, lumbar region: Secondary | ICD-10-CM

## 2017-06-03 DIAGNOSIS — Z79891 Long term (current) use of opiate analgesic: Secondary | ICD-10-CM | POA: Diagnosis not present

## 2017-06-03 DIAGNOSIS — M5416 Radiculopathy, lumbar region: Secondary | ICD-10-CM

## 2017-06-03 MED ORDER — OXYCODONE-ACETAMINOPHEN 10-325 MG PO TABS: 1.0000 | ORAL_TABLET | Freq: Four times a day (QID) | ORAL | 0 refills | Status: DC | PRN

## 2017-06-03 MED ORDER — FENTANYL 25 MCG/HR TD PT72: 25.0000 ug | MEDICATED_PATCH | TRANSDERMAL | 0 refills | Status: DC

## 2017-06-03 NOTE — Progress Notes (Signed)
Subjective:    Patient ID: Alexandria Mclaughlin, female    DOB: Jul 26, 1940, 78 y.o.   MRN: [id number]  HPI   Alexandria Mclaughlin is here in follow up of her chronic pain. She has seen Dr. Louanne Skye as she developed increased low back and right leg pain. He ultimately sent her to Dr. Ernestina Patches for ESI's which were done in [place], she states that the injection was helpful. What was more helpful was her recent use of hemp oil which reduced her use of percocet.     I reviewed the MRI performed in January which revealed:   L3-L4: Increased circumferential disc bulging eccentric to the left with broad-based left subarticular component. Moderate facet and ligament flavum hypertrophy is mildly increased. Increased mild to moderate left lateral recess stenosis (descending left L4 nerve level). New mild left L3 neural foraminal stenosis. No spinal stenosis.  L4-L5: Chronic adjacent segment disease, but increased disc space loss (new vacuum disc), left eccentric circumferential disc bulging with broad-based posterior and biforaminal components of disc, and increased severe facet and ligament flavum hypertrophy since 20/10. Subsequent increased and now severe spinal and left greater than right lateral recess stenosis (descending L5 nerve levels). There is associated increased mild to moderate bilateral L4 neural foraminal stenosis, greater on the right.  L5-S1: Chronic decompression and fusion with solid arthrodesis. Mild osseous overgrowth at both neural foramina was better demonstrated on the 2016 CT and is greater on the left.  She remains on fentanyl patch for pain as well as Percocet 10/325 1 every 6 hours as needed.  She is only used 3 of these over the last few days.   Need UDS today Pain Inventory Average Pain 4 Pain Right Now 3 My pain is intermittent and sharp  In the last 24 hours, has pain interfered with the following? General activity 2 Relation with others 2 Enjoyment of life 3 What TIME of  day is your pain at its worst? daytime Sleep (in general) Fair  Pain is worse with: bending and sitting Pain improves with: heat/ice and medication Relief from Meds: 4  Mobility Do you have any goals in this area?  no  Function Do you have any goals in this area?  no  Neuro/Psych No problems in this area  Prior Studies Any changes since last visit?  no  Physicians involved in your care Any changes since last visit?  no   Family History  Problem Relation Age of Onset  . Diabetes Mother   . Heart disease Mother   . Stroke Mother   . Heart disease Father   . Heart disease Brother    Social History   Socioeconomic History  . Marital status: Widowed    Spouse name: Not on file  . Number of children: Not on file  . Years of education: Not on file  . Highest education level: Not on file  Occupational History  . Not on file  Social Needs  . Financial resource strain: Not on file  . Food insecurity:    Worry: Not on file    Inability: Not on file  . Transportation needs:    Medical: Not on file    Non-medical: Not on file  Tobacco Use  . Smoking status: Former Smoker    Packs/day: 0.50    Years: 5.00    Pack years: 2.50    Types: Cigarettes    Last attempt to quit: 03/15/1968    Years since quitting: 49.2  . Smokeless tobacco:  Never Used  Substance and Sexual Activity  . Alcohol use: Yes    Alcohol/week: 0.6 oz    Types: 1 Glasses of wine per week    Comment: occasional glass of wine  . Drug use: No  . Sexual activity: Not on file  Lifestyle  . Physical activity:    Days per week: Not on file    Minutes per session: Not on file  . Stress: Not on file  Relationships  . Social connections:    Talks on phone: Not on file    Gets together: Not on file    Attends religious service: Not on file    Active member of club or organization: Not on file    Attends meetings of clubs or organizations: Not on file    Relationship status: Not on file  Other Topics  Concern  . Not on file  Social History Narrative  . Not on file   Past Surgical History:  Procedure Laterality Date  . RETINAL DETACHMENT SURGERY  9/13   Past Medical History:  Diagnosis Date  . Cervical facet syndrome   . Depression   . Disorders of sacrum   . Enthesopathy of hip region   . Headache   . Hypertension   . Sciatic neuritis   . Synovitis and tenosynovitis   . Vision abnormalities    BP 137/87   Pulse 74   Ht 5\' 2"  (1.575 m)   Wt 144 lb (65.3 kg)   SpO2 95%   BMI 26.34 kg/m   Opioid Risk Score:   Fall Risk Score:  `1  Depression screen PHQ 2/9  Depression screen [facility] 2/9 06/03/2017 04/08/2017 10/22/2015 05/23/2015 03/20/2015 02/21/2015 11/09/2014  Decreased Interest 0 0 0 0 0 1 0  Down, Depressed, Hopeless 0 0 0 0 0 0 0  PHQ - 2 Score 0 0 0 0 0 1 0  Altered sleeping 0 - - 0 - - -  Tired, decreased energy 0 - - 3 - - -  Change in appetite 0 - - 3 - - -  Feeling bad or failure about yourself  0 - - 0 - - -  Trouble concentrating 0 - - 0 - - -  Moving slowly or fidgety/restless 0 - - 0 - - -  Suicidal thoughts 0 - - 0 - - -  PHQ-9 Score 0 - - 6 - - -   Review of Systems  Constitutional: Negative.   HENT: Negative.   Eyes: Negative.   Respiratory: Negative.   Cardiovascular: Negative.   Gastrointestinal: Negative.   Endocrine: Negative.   Genitourinary: Negative.   Musculoskeletal: Negative.   Skin: Negative.   Allergic/Immunologic: Negative.   Neurological: Negative.   Hematological: Negative.   Psychiatric/Behavioral: Negative.   All other systems reviewed and are negative.      Objective:   Physical Exam  General: No acute distress HEENT: EOMI, oral membranes moist Cards: reg rate  Chest: normal effort Abdomen: Soft, NT, ND Skin: dry, intact Extremities: no edema Musculoskeletal:   . Lumbar rom functional. Mild psis pain with FABER testing RLE. SLR/SST negative.  Neurological: she is alert and appropriate. strength 5/5 Skin: Skin is  warmand dry.  Psychiatric: She has a normal mood and affect.  .          1. Post laminectomy syndrome with right buttock pain: Pain controlled on Fentanyl and Percocet.  Continuefentanyl 26mcg/hr#10one patch every 3 days and Percocet 10/325 mg #90--q6prn We will continue  the controlled substance monitoring program, this consists of regular clinic visits, examinations, routine drug screening, pill counts as well as use of [place] Controlled Substance Reporting System. [place] today. -UDS today   2. Neuropathic Pain/right sided lumbar radicular pain:  -continuegabapentin 300mg  TID and 600mg  qhs -continue HEP and increase activity as posible. I believe she can put off further surgery by remaining active, working on core/rom/etc 3. Gait Disorder: Continue HEP /Neurology Following  4. Myoclonus with sleep: Continue Klonopin 5. Constipation opioid induced: begin scheduled senokot-s  15 minutes of face to face patient care time was spent during this visit. All questions were encouraged and answered. Greater than 50% of time during this encounter was spent counseling patient/family in regard to pain mgt considerations, use of hemp oil, etc.

## 2017-06-09 LAB — TOXASSURE SELECT,+ANTIDEPR,UR

## 2017-06-10 ENCOUNTER — Telehealth: Payer: Self-pay | Admitting: *Deleted

## 2017-06-10 NOTE — Telephone Encounter (Signed)
Urine drug screen for this encounter is consistent for prescribed medication.  There is a low level oxazepam that is a benzodiazepine metabolite.  She takes clonazepam though oxazepam is related to diazepam, temazepam, tranxene.

## 2017-06-20 ENCOUNTER — Other Ambulatory Visit: Payer: Self-pay | Admitting: Physical Medicine & Rehabilitation

## 2017-06-25 ENCOUNTER — Other Ambulatory Visit (HOSPITAL_COMMUNITY)
Admission: RE | Admit: 2017-06-25 | Discharge: 2017-06-25 | Disposition: A | Discharge status: Home / Self Care | Payer: Medicare HMO | Source: Ambulatory Visit | Attending: Student | Admitting: Student

## 2017-06-25 ENCOUNTER — Ambulatory Visit
Admission: RE | Admit: 2017-06-25 | Discharge: 2017-06-25 | Disposition: A | Discharge status: Home / Self Care | Payer: Medicare HMO | Source: Ambulatory Visit | Attending: Internal Medicine | Admitting: Internal Medicine

## 2017-06-25 DIAGNOSIS — E041 Nontoxic single thyroid nodule: Secondary | ICD-10-CM

## 2017-06-30 DIAGNOSIS — G8929 Other chronic pain: Secondary | ICD-10-CM | POA: Diagnosis not present

## 2017-06-30 DIAGNOSIS — R69 Illness, unspecified: Secondary | ICD-10-CM | POA: Diagnosis not present

## 2017-06-30 DIAGNOSIS — K219 Gastro-esophageal reflux disease without esophagitis: Secondary | ICD-10-CM | POA: Diagnosis not present

## 2017-06-30 DIAGNOSIS — H532 Diplopia: Secondary | ICD-10-CM | POA: Diagnosis not present

## 2017-06-30 DIAGNOSIS — E785 Hyperlipidemia, unspecified: Secondary | ICD-10-CM | POA: Diagnosis not present

## 2017-06-30 DIAGNOSIS — G2581 Restless legs syndrome: Secondary | ICD-10-CM | POA: Diagnosis not present

## 2017-06-30 DIAGNOSIS — I10 Essential (primary) hypertension: Secondary | ICD-10-CM | POA: Diagnosis not present

## 2017-06-30 DIAGNOSIS — G4700 Insomnia, unspecified: Secondary | ICD-10-CM | POA: Diagnosis not present

## 2017-07-14 DIAGNOSIS — R69 Illness, unspecified: Secondary | ICD-10-CM | POA: Diagnosis not present

## 2017-07-15 ENCOUNTER — Encounter: Payer: Medicare HMO | Attending: Physical Medicine and Rehabilitation | Admitting: Physical Medicine & Rehabilitation

## 2017-07-15 ENCOUNTER — Encounter: Payer: Self-pay | Admitting: Physical Medicine & Rehabilitation

## 2017-07-15 VITALS — BP 148/94 | HR 71 | Resp 14 | Ht 63.0 in | Wt 138.0 lb

## 2017-07-15 DIAGNOSIS — M961 Postlaminectomy syndrome, not elsewhere classified: Secondary | ICD-10-CM

## 2017-07-15 DIAGNOSIS — M533 Sacrococcygeal disorders, not elsewhere classified: Secondary | ICD-10-CM | POA: Diagnosis not present

## 2017-07-15 DIAGNOSIS — M2170 Unequal limb length (acquired), unspecified site: Secondary | ICD-10-CM | POA: Insufficient documentation

## 2017-07-15 DIAGNOSIS — M47816 Spondylosis without myelopathy or radiculopathy, lumbar region: Secondary | ICD-10-CM

## 2017-07-15 MED ORDER — FENTANYL 12 MCG/HR TD PT72: 12.5000 ug | MEDICATED_PATCH | TRANSDERMAL | 0 refills | Status: DC

## 2017-07-15 NOTE — Patient Instructions (Addendum)
PLEASE FEEL FREE TO CALL OUR OFFICE WITH ANY PROBLEMS OR QUESTIONS ([phone])   TAKE THE 12MCG FENTANYL FOR 15 DAYS THEN STOP. IF YOU EXPERIENCE ANY INCREASED PAIN OR WITHDRAWAL SYMPTOMS, BEGIN THE NEXT BOX AND CALL OUR OFFICE.

## 2017-07-15 NOTE — Progress Notes (Signed)
Subjective:    Patient ID: Alexandria Mclaughlin, female    DOB: [birth year], 77 y.o.   MRN: [id number]  HPI   Alexandria Mclaughlin is here in follow up of her chronic pain. She states that her pain has continued to improve. She remains on the hemp oil which seems to have helped.  She is reduced her Percocet use quite a bit.  She still has 87 of 90 tablets left from her last prescription.  She remains on a fentanyl patch 25 mcg every 72 hours.   Pain Inventory Average Pain 3 Pain Right Now 3 My pain is intermittent, sharp and stabbing  In the last 24 hours, has pain interfered with the following? General activity 4 Relation with others 4 Enjoyment of life 4 What TIME of day is your pain at its worst? daytime Sleep (in general) Fair  Pain is worse with: walking, bending and standing Pain improves with: rest, heat/ice and medication Relief from Meds: no selection  Mobility Do you have any goals in this area?  no  Function Do you have any goals in this area?  no  Neuro/Psych No problems in this area  Prior Studies Any changes since last visit?  no  Physicians involved in your care Any changes since last visit?  no   Family History  Problem Relation Age of Onset  . Diabetes Mother   . Heart disease Mother   . Stroke Mother   . Heart disease Father   . Heart disease Brother    Social History   Socioeconomic History  . Marital status: Widowed    Spouse name: Not on file  . Number of children: Not on file  . Years of education: Not on file  . Highest education level: Not on file  Occupational History  . Not on file  Social Needs  . Financial resource strain: Not on file  . Food insecurity:    Worry: Not on file    Inability: Not on file  . Transportation needs:    Medical: Not on file    Non-medical: Not on file  Tobacco Use  . Smoking status: Former Smoker    Packs/day: 0.50    Years: 5.00    Pack years: 2.50    Types: Cigarettes    Last attempt to quit: 03/15/1968   Years since quitting: 49.3  . Smokeless tobacco: Never Used  Substance and Sexual Activity  . Alcohol use: Yes    Alcohol/week: 0.6 oz    Types: 1 Glasses of wine per week    Comment: occasional glass of wine  . Drug use: No  . Sexual activity: Not on file  Lifestyle  . Physical activity:    Days per week: Not on file    Minutes per session: Not on file  . Stress: Not on file  Relationships  . Social connections:    Talks on phone: Not on file    Gets together: Not on file    Attends religious service: Not on file    Active member of club or organization: Not on file    Attends meetings of clubs or organizations: Not on file    Relationship status: Not on file  Other Topics Concern  . Not on file  Social History Narrative  . Not on file   Past Surgical History:  Procedure Laterality Date  . RETINAL DETACHMENT SURGERY  9/13   Past Medical History:  Diagnosis Date  . Cervical facet syndrome   .  Depression   . Disorders of sacrum   . Enthesopathy of hip region   . Headache   . Hypertension   . Sciatic neuritis   . Synovitis and tenosynovitis   . Vision abnormalities    BP (!) 148/94   Pulse 71   Resp 14   Ht 5\' 3"  (1.6 m)   Wt 138 lb (62.6 kg)   SpO2 97%   BMI 24.45 kg/m   Opioid Risk Score:   Fall Risk Score:  `1  Depression screen PHQ 2/9  Depression screen [facility] 2/9 06/03/2017 04/08/2017 10/22/2015 05/23/2015 03/20/2015 02/21/2015 11/09/2014  Decreased Interest 0 0 0 0 0 1 0  Down, Depressed, Hopeless 0 0 0 0 0 0 0  PHQ - 2 Score 0 0 0 0 0 1 0  Altered sleeping 0 - - 0 - - -  Tired, decreased energy 0 - - 3 - - -  Change in appetite 0 - - 3 - - -  Feeling bad or failure about yourself  0 - - 0 - - -  Trouble concentrating 0 - - 0 - - -  Moving slowly or fidgety/restless 0 - - 0 - - -  Suicidal thoughts 0 - - 0 - - -  PHQ-9 Score 0 - - 6 - - -    Review of Systems  Constitutional: Negative.   HENT: Negative.   Eyes: Negative.   Respiratory: Negative.    Cardiovascular: Negative.   Gastrointestinal: Negative.   Endocrine: Negative.   Genitourinary: Negative.   Musculoskeletal: Positive for back pain.  Skin: Negative.   Allergic/Immunologic: Negative.   Neurological: Negative.   Hematological: Negative.   Psychiatric/Behavioral: Negative.   All other systems reviewed and are negative.      Objective:   Physical Exam  General: No acute distress HEENT: EOMI, oral membranes moist Cards: reg rate  Chest: normal effort Abdomen: Soft, NT, ND Skin: dry, intact Extremities: no edema Musculoskeletal:   Lumbar rom functional. Mild PSIS pain .  Neurological:she is alert and appropriate.strength 5/5 Skin: Skin is warmand dry.  Psychiatric: pleasant .         1. Post laminectomy syndrome with right buttock pain: Pain controlled on Fentanyl and Percocet.  -We will reduce fentanyl to 12 mcg patch every 72 hours, #10.  She will take the first box and then stop.  If she has any increase in pain she can resume the next box and contact our office.  I did not refill her Percocet 10/325 #90 as she still has 87 tablets left of the old prescription. -We will continue the controlled substance monitoring program, this consists of regular clinic visits, examinations, routine drug screening, pill counts as well as use of [place] Controlled Substance Reporting System. [place].  -she may continue with hemp oil as she seems to be tolerating it without issue. It may give her the potential to come off her narcotic medications as well  2. Neuropathic Pain/right sided lumbar radicular pain:  -continuegabapentin 300mg  TID and 600mg  qhs -continue HEP and increase activity as posible. I believe she can put off further surgery by remaining active, working on core/rom/etc 3. Gait Disorder: Continue HEP /Neurology Following  4. Myoclonus with sleep: Continue Klonopin 5. Constipation  opioid induced: begin scheduled senokot-s  37minutes of face to face patient care time was spent during this visit. All questions were encouraged and answered. Follow up in 2 months. Greater than 50% of time during this  encounter was spent counseling patient/family in regard to pain mgt, med taper, hemp oil.

## 2017-07-22 ENCOUNTER — Telehealth: Payer: Self-pay

## 2017-07-22 NOTE — Telephone Encounter (Signed)
Incoming fax from [place] that Fentanyl was approved 02/22/18-02/23/18

## 2017-07-30 ENCOUNTER — Encounter (INDEPENDENT_AMBULATORY_CARE_PROVIDER_SITE_OTHER): Payer: Self-pay | Admitting: Specialist

## 2017-07-30 ENCOUNTER — Ambulatory Visit (INDEPENDENT_AMBULATORY_CARE_PROVIDER_SITE_OTHER): Payer: Medicare HMO | Admitting: Specialist

## 2017-07-30 VITALS — BP 155/81 | HR 70 | Ht 63.0 in | Wt 140.0 lb

## 2017-07-30 DIAGNOSIS — Z981 Arthrodesis status: Secondary | ICD-10-CM | POA: Diagnosis not present

## 2017-07-30 DIAGNOSIS — M48062 Spinal stenosis, lumbar region with neurogenic claudication: Secondary | ICD-10-CM | POA: Diagnosis not present

## 2017-07-30 DIAGNOSIS — M4316 Spondylolisthesis, lumbar region: Secondary | ICD-10-CM

## 2017-07-30 NOTE — Progress Notes (Signed)
Office Visit Note   Patient: Alexandria Mclaughlin           Date of Birth: 30-May-1940           MRN: [id number] Visit Date: 07/30/2017              Requested by: Merrilee Seashore, [address] [place], [place] [address] PCP: Merrilee Seashore, MD   Assessment & Plan: Visit Diagnoses:  1. Spinal stenosis of lumbar region with neurogenic claudication   2. Spondylolisthesis, lumbar region   3. History of lumbar spinal fusion     Plan: Avoid bending, stooping and avoid lifting weights greater than 10 lbs. Avoid prolong standing and walking. Avoid frequent bending and stooping  No lifting greater than 10 lbs. May use ice or moist heat for pain. Weight loss is of benefit. Handicap license is approved.  Follow-Up Instructions: Return in about 1 month (around 08/27/2017).   Orders:  No orders of the defined types were placed in this encounter.  No orders of the defined types were placed in this encounter.     Procedures: No procedures performed   Clinical Data: No additional findings.   Subjective: Chief Complaint  Patient presents with  . Lower Back - Follow-up    77 year old female with history of low back pain and L5-S1 fusion post removal of hardware she now has adjacent level stenosis L4-5  And has difficulty walking any distance. She takes gabapentin and CBD oil capsules that seem to help.    Review of Systems   Objective: Vital Signs: BP (!) 155/81 (BP Location: Left Arm, Patient Position: Sitting)   Pulse 70   Ht 5\' 3"  (1.6 m)   Wt 140 lb (63.5 kg)   BMI 24.80 kg/m   Physical Exam  Constitutional: She is oriented to person, place, and time. She appears well-developed and well-nourished.  HENT:  Head: Normocephalic and atraumatic.  Eyes: Pupils are equal, round, and reactive to light. EOM are normal.  Neck: Normal range of motion. Neck supple.  Pulmonary/Chest: Effort normal and breath sounds normal.  Abdominal: Soft. Bowel sounds are  normal.  Neurological: She is alert and oriented to person, place, and time.  Skin: Skin is warm and dry.  Psychiatric: She has a normal mood and affect. Her behavior is normal. Judgment and thought content normal.    Back Exam   Tenderness  The patient is experiencing tenderness in the lumbar.  Range of Motion  Extension: abnormal  Flexion: abnormal  Lateral bend right: abnormal  Lateral bend left: abnormal  Rotation right: abnormal  Rotation left: abnormal   Muscle Strength  Right Quadriceps:  5/5  Left Quadriceps:  5/5  Right Hamstrings:  5/5  Left Hamstrings:  5/5   Tests  Straight leg raise right: negative Straight leg raise left: negative  Reflexes  Patellar: normal Achilles: normal Biceps: normal Babinski's sign: normal   Other  Toe walk: normal Heel walk: normal Sensation: normal Gait: normal  Erythema: no back redness Scars: absent      Specialty Comments:  No specialty comments available.  Imaging: No results found.   PMFS History: Patient Active Problem List   Diagnosis Date Noted  . Lumbar radicular pain 07/29/2016  . Myoclonus 09/19/2015  . Therapeutic opioid induced constipation 07/25/2015  . White matter changes 02/07/2015  . Essential hypertension 02/07/2015  . HLD (hyperlipidemia) 02/07/2015  . Low back pain 02/07/2015  . BPPV (benign paroxysmal positional vertigo) 02/07/2015  .  Abnormality of gait 02/07/2015  . Lumbar facet arthropathy 07/05/2014  . Acquired unequal leg length on left 08/08/2011  . Lumbar post-laminectomy syndrome 06/11/2011  . Sacroiliac joint dysfunction 06/11/2011   Past Medical History:  Diagnosis Date  . Cervical facet syndrome   . Depression   . Disorders of sacrum   . Enthesopathy of hip region   . Headache   . Hypertension   . Sciatic neuritis   . Synovitis and tenosynovitis   . Vision abnormalities     Family History  Problem Relation Age of Onset  . Diabetes Mother   . Heart disease  Mother   . Stroke Mother   . Heart disease Father   . Heart disease Brother     Past Surgical History:  Procedure Laterality Date  . RETINAL DETACHMENT SURGERY  9/13   Social History   Occupational History  . Not on file  Tobacco Use  . Smoking status: Former Smoker    Packs/day: 0.50    Years: 5.00    Pack years: 2.50    Types: Cigarettes    Last attempt to quit: 03/15/1968    Years since quitting: 49.4  . Smokeless tobacco: Never Used  Substance and Sexual Activity  . Alcohol use: Yes    Alcohol/week: 0.6 oz    Types: 1 Glasses of wine per week    Comment: occasional glass of wine  . Drug use: No  . Sexual activity: Not on file

## 2017-07-30 NOTE — Patient Instructions (Signed)
Avoid bending, stooping and avoid lifting weights greater than 10 lbs. Avoid prolong standing and walking. Avoid frequent bending and stooping  No lifting greater than 10 lbs. May use ice or moist heat for pain. Weight loss is of benefit. Handicap license is approved.  

## 2017-08-12 ENCOUNTER — Other Ambulatory Visit: Payer: Self-pay | Admitting: Physical Medicine & Rehabilitation

## 2017-08-12 DIAGNOSIS — G253 Myoclonus: Secondary | ICD-10-CM

## 2017-08-13 ENCOUNTER — Other Ambulatory Visit: Payer: Self-pay | Admitting: Physical Medicine & Rehabilitation

## 2017-08-15 ENCOUNTER — Encounter: Payer: Self-pay | Admitting: Physical Medicine & Rehabilitation

## 2017-08-17 ENCOUNTER — Telehealth: Payer: Self-pay | Admitting: Registered Nurse

## 2017-08-17 DIAGNOSIS — M47816 Spondylosis without myelopathy or radiculopathy, lumbar region: Secondary | ICD-10-CM

## 2017-08-17 DIAGNOSIS — M961 Postlaminectomy syndrome, not elsewhere classified: Secondary | ICD-10-CM

## 2017-08-17 DIAGNOSIS — M533 Sacrococcygeal disorders, not elsewhere classified: Secondary | ICD-10-CM

## 2017-08-17 MED ORDER — FENTANYL 12 MCG/HR TD PT72: 12.5000 ug | MEDICATED_PATCH | TRANSDERMAL | 0 refills | Status: DC

## 2017-08-17 NOTE — Telephone Encounter (Signed)
Placed a call to Ms. Dicker, she reports her pain has intensified in her lower back when she was weaned off the Fentanyl. We will e-scribe the Fentanyl patches, she is awre of the above and verbalizes understanding. Dr. Naaman Plummer note was reviewed. PMP Aware Web-site was reviewed last prescription picked up on 07/15/2017.

## 2017-08-19 ENCOUNTER — Other Ambulatory Visit: Payer: Self-pay | Admitting: Registered Nurse

## 2017-08-23 ENCOUNTER — Other Ambulatory Visit: Payer: Self-pay | Admitting: Physical Medicine & Rehabilitation

## 2017-08-23 DIAGNOSIS — M5416 Radiculopathy, lumbar region: Secondary | ICD-10-CM

## 2017-08-23 DIAGNOSIS — M47816 Spondylosis without myelopathy or radiculopathy, lumbar region: Secondary | ICD-10-CM

## 2017-08-23 DIAGNOSIS — M961 Postlaminectomy syndrome, not elsewhere classified: Secondary | ICD-10-CM

## 2017-08-23 DIAGNOSIS — M533 Sacrococcygeal disorders, not elsewhere classified: Secondary | ICD-10-CM

## 2017-08-24 ENCOUNTER — Telehealth: Payer: Self-pay | Admitting: Registered Nurse

## 2017-08-24 ENCOUNTER — Encounter: Payer: Self-pay | Admitting: Physical Medicine & Rehabilitation

## 2017-08-24 DIAGNOSIS — M533 Sacrococcygeal disorders, not elsewhere classified: Secondary | ICD-10-CM

## 2017-08-24 DIAGNOSIS — M961 Postlaminectomy syndrome, not elsewhere classified: Secondary | ICD-10-CM

## 2017-08-24 DIAGNOSIS — M47816 Spondylosis without myelopathy or radiculopathy, lumbar region: Secondary | ICD-10-CM

## 2017-08-24 MED ORDER — OXYCODONE-ACETAMINOPHEN 10-325 MG PO TABS: 1.0000 | ORAL_TABLET | Freq: Two times a day (BID) | ORAL | 0 refills | Status: DC | PRN

## 2017-08-24 NOTE — Telephone Encounter (Signed)
Alexandria Mclaughlin called for a refilled on her Oxycodone, Dr. Naaman Plummer note was reviewed. Her Oxycodone prescription was not filled on 07/15/2017 she had 87 tablets. According to the PMP Aware Web-site, her last Oxycodone prescription was filled on 07/05/2017. Fentanyl resumed on 08/17/2017. Oxycodone will be decrease to twice a day as needed. [name] states she's only using the oxycodone twice a day as needed.

## 2017-09-09 ENCOUNTER — Encounter: Payer: Self-pay | Admitting: Physical Medicine & Rehabilitation

## 2017-09-09 ENCOUNTER — Encounter: Payer: Medicare HMO | Attending: Physical Medicine and Rehabilitation | Admitting: Physical Medicine & Rehabilitation

## 2017-09-09 VITALS — BP 142/83 | HR 79 | Ht 63.0 in | Wt 139.0 lb

## 2017-09-09 DIAGNOSIS — M961 Postlaminectomy syndrome, not elsewhere classified: Secondary | ICD-10-CM | POA: Diagnosis not present

## 2017-09-09 DIAGNOSIS — M47816 Spondylosis without myelopathy or radiculopathy, lumbar region: Secondary | ICD-10-CM

## 2017-09-09 DIAGNOSIS — M5416 Radiculopathy, lumbar region: Secondary | ICD-10-CM

## 2017-09-09 DIAGNOSIS — M2170 Unequal limb length (acquired), unspecified site: Secondary | ICD-10-CM | POA: Diagnosis not present

## 2017-09-09 DIAGNOSIS — M533 Sacrococcygeal disorders, not elsewhere classified: Secondary | ICD-10-CM | POA: Diagnosis not present

## 2017-09-09 MED ORDER — FENTANYL 25 MCG/HR TD PT72: 25.0000 ug | MEDICATED_PATCH | TRANSDERMAL | 0 refills | Status: DC

## 2017-09-09 MED ORDER — GABAPENTIN 600 MG PO TABS: 600.0000 mg | ORAL_TABLET | Freq: Four times a day (QID) | ORAL | 3 refills | Status: DC

## 2017-09-09 MED ORDER — OXYCODONE-ACETAMINOPHEN 10-325 MG PO TABS: 1.0000 | ORAL_TABLET | Freq: Two times a day (BID) | ORAL | 0 refills | Status: DC | PRN

## 2017-09-09 NOTE — Patient Instructions (Signed)
PLEASE FEEL FREE TO CALL OUR OFFICE WITH ANY PROBLEMS OR QUESTIONS ([phone])      

## 2017-09-09 NOTE — Progress Notes (Signed)
Subjective:    Patient ID: Alexandria Mclaughlin, female    DOB: 11-14-1940, 77 y.o.   MRN: [id number]  HPI   Alexandria Mclaughlin is here in follow up of her chronic pain. She has had increased pain. She has talked to Dr. Louanne Skye about further surgery which may unavoidable.  She has a follow-up visit scheduled for next month with him.  Her MRI does show significant stenosis of her lumbar spine.  Along with the increased pain in her back and right leg she has noticed some weakness in that same area.  She feels that her legs going to give out.  The last time I saw her, we did reduce her fentanyl patch down to 12 mcg and reduced Percocet as well.  She remains on gabapentin 300 mg 3 times daily and 600 mg nightly for neuropathic pain in addition to her Cymbalta 30 mg daily.  Pain Inventory Average Pain 5 Pain Right Now 5 My pain is constant, sharp and stabbing  In the last 24 hours, has pain interfered with the following? General activity 6 Relation with others 6 Enjoyment of life 7 What TIME of day is your pain at its worst? daytime Sleep (in general) Fair  Pain is worse with: walking, bending, sitting and standing Pain improves with: rest, heat/ice and medication Relief from Meds: 5  Mobility walk without assistance  Function Do you have any goals in this area?  no  Neuro/Psych bladder control problems  Prior Studies Any changes since last visit?  no  Physicians involved in your care Any changes since last visit?  no   Family History  Problem Relation Age of Onset  . Diabetes Mother   . Heart disease Mother   . Stroke Mother   . Heart disease Father   . Heart disease Brother    Social History   Socioeconomic History  . Marital status: Widowed    Spouse name: Not on file  . Number of children: Not on file  . Years of education: Not on file  . Highest education level: Not on file  Occupational History  . Not on file  Social Needs  . Financial resource strain: Not on file  . Food  insecurity:    Worry: Not on file    Inability: Not on file  . Transportation needs:    Medical: Not on file    Non-medical: Not on file  Tobacco Use  . Smoking status: Former Smoker    Packs/day: 0.50    Years: 5.00    Pack years: 2.50    Types: Cigarettes    Last attempt to quit: 03/15/1968    Years since quitting: 49.5  . Smokeless tobacco: Never Used  Substance and Sexual Activity  . Alcohol use: Yes    Alcohol/week: 0.6 oz    Types: 1 Glasses of wine per week    Comment: occasional glass of wine  . Drug use: No  . Sexual activity: Not on file  Lifestyle  . Physical activity:    Days per week: Not on file    Minutes per session: Not on file  . Stress: Not on file  Relationships  . Social connections:    Talks on phone: Not on file    Gets together: Not on file    Attends religious service: Not on file    Active member of club or organization: Not on file    Attends meetings of clubs or organizations: Not on file  Relationship status: Not on file  Other Topics Concern  . Not on file  Social History Narrative  . Not on file   Past Surgical History:  Procedure Laterality Date  . RETINAL DETACHMENT SURGERY  9/13   Past Medical History:  Diagnosis Date  . Cervical facet syndrome   . Depression   . Disorders of sacrum   . Enthesopathy of hip region   . Headache   . Hypertension   . Sciatic neuritis   . Synovitis and tenosynovitis   . Vision abnormalities    There were no vitals taken for this visit.  Opioid Risk Score:   Fall Risk Score:  `1  Depression screen PHQ 2/9  Depression screen [facility] 2/9 06/03/2017 04/08/2017 10/22/2015 05/23/2015 03/20/2015 02/21/2015 11/09/2014  Decreased Interest 0 0 0 0 0 1 0  Down, Depressed, Hopeless 0 0 0 0 0 0 0  PHQ - 2 Score 0 0 0 0 0 1 0  Altered sleeping 0 - - 0 - - -  Tired, decreased energy 0 - - 3 - - -  Change in appetite 0 - - 3 - - -  Feeling bad or failure about yourself  0 - - 0 - - -  Trouble concentrating 0  - - 0 - - -  Moving slowly or fidgety/restless 0 - - 0 - - -  Suicidal thoughts 0 - - 0 - - -  PHQ-9 Score 0 - - 6 - - -     Review of Systems  Constitutional: Negative.   HENT: Negative.   Eyes: Negative.   Respiratory: Negative.   Cardiovascular: Negative.   Gastrointestinal: Negative.   Endocrine: Negative.   Genitourinary: Positive for difficulty urinating.  Musculoskeletal: Positive for arthralgias, back pain and myalgias.  Skin: Negative.   Allergic/Immunologic: Negative.   Neurological: Negative.   Hematological: Negative.   Psychiatric/Behavioral: Negative.   All other systems reviewed and are negative.      Objective:   Physical Exam  General: No acute distress HEENT: EOMI, oral membranes moist Cards: reg rate  Chest: normal effort Abdomen: Soft, NT, ND Skin: dry, intact Extremities: no edema Musculoskeletal:  Lumbar rom functional. Mild PSIS pain.  Neurological:she is alert and appropriate.strength 5/5 UE, 3-4/5 RLE and 4/5 LLE. DTr's 1+ to trace in LE's. Antalgic RLE Skin: Skin is warmand dry.  Psychiatric: pleasant .         1. Post laminectomy syndrome with right buttock pain. Pt with worsening central stenosis on MRI with neurogenic claudication:  -increase to 25mcg patch every 72 hours, #10.   Continue Percocet 10/325 #change to #75  -We will continue the controlled substance monitoring program, this consists of regular clinic visits, examinations, routine drug screening, pill counts as well as use of [place] Controlled Substance Reporting System. [place] today.   -she may continue with hemp oil as she seems to be tolerating it without issue. 2. Neuropathic Pain/right sided lumbar radicular pain:  -increase gabapentin to 600mg  QID.  -continue HEP as possible 3. Gait Disorder: Continue HEP /Neurology Following  -May benefit from the use of a straight cane in the short-term to help with  balance and to additionally assist with her posture in standing and walking.  4. Myoclonus with sleep: Continue Klonopin 5. Constipation opioid induced:  scheduled senokot-s  76minutes of face to face patient care time was spent during this visit. All questions were encouraged and answered.

## 2017-09-15 DIAGNOSIS — E0590 Thyrotoxicosis, unspecified without thyrotoxic crisis or storm: Secondary | ICD-10-CM | POA: Diagnosis not present

## 2017-09-15 DIAGNOSIS — E782 Mixed hyperlipidemia: Secondary | ICD-10-CM | POA: Diagnosis not present

## 2017-09-22 DIAGNOSIS — E0590 Thyrotoxicosis, unspecified without thyrotoxic crisis or storm: Secondary | ICD-10-CM | POA: Diagnosis not present

## 2017-09-22 DIAGNOSIS — I10 Essential (primary) hypertension: Secondary | ICD-10-CM | POA: Diagnosis not present

## 2017-09-22 DIAGNOSIS — E041 Nontoxic single thyroid nodule: Secondary | ICD-10-CM | POA: Diagnosis not present

## 2017-09-22 DIAGNOSIS — E782 Mixed hyperlipidemia: Secondary | ICD-10-CM | POA: Diagnosis not present

## 2017-10-12 ENCOUNTER — Encounter: Payer: Self-pay | Admitting: Physical Medicine & Rehabilitation

## 2017-10-12 ENCOUNTER — Encounter: Payer: Medicare HMO | Attending: Physical Medicine and Rehabilitation | Admitting: Physical Medicine & Rehabilitation

## 2017-10-12 ENCOUNTER — Other Ambulatory Visit: Payer: Self-pay

## 2017-10-12 VITALS — BP 137/83 | HR 75 | Ht 63.0 in | Wt 139.8 lb

## 2017-10-12 DIAGNOSIS — M47816 Spondylosis without myelopathy or radiculopathy, lumbar region: Secondary | ICD-10-CM

## 2017-10-12 DIAGNOSIS — M5416 Radiculopathy, lumbar region: Secondary | ICD-10-CM

## 2017-10-12 DIAGNOSIS — M533 Sacrococcygeal disorders, not elsewhere classified: Secondary | ICD-10-CM

## 2017-10-12 DIAGNOSIS — Z79891 Long term (current) use of opiate analgesic: Secondary | ICD-10-CM

## 2017-10-12 DIAGNOSIS — Z5181 Encounter for therapeutic drug level monitoring: Secondary | ICD-10-CM

## 2017-10-12 DIAGNOSIS — M961 Postlaminectomy syndrome, not elsewhere classified: Secondary | ICD-10-CM

## 2017-10-12 DIAGNOSIS — M2170 Unequal limb length (acquired), unspecified site: Secondary | ICD-10-CM | POA: Diagnosis not present

## 2017-10-12 DIAGNOSIS — G894 Chronic pain syndrome: Secondary | ICD-10-CM

## 2017-10-12 MED ORDER — OXYCODONE-ACETAMINOPHEN 10-325 MG PO TABS: 1.0000 | ORAL_TABLET | Freq: Two times a day (BID) | ORAL | 0 refills | Status: DC | PRN

## 2017-10-12 MED ORDER — FENTANYL 25 MCG/HR TD PT72: 25.0000 ug | MEDICATED_PATCH | TRANSDERMAL | 0 refills | Status: DC

## 2017-10-12 NOTE — Progress Notes (Signed)
Subjective:    Patient ID: Alexandria Mclaughlin, female    DOB: 07-19-1940, 77 y.o.   MRN: [id number]  HPI   Alexandria Mclaughlin is here in follow up of her chronic low back pain. She has experienced worsening pain over the summer. When her significant other was in the hospital, she experienced increased pain in her low back when walking the halls, parking in the hospital.   Her most recent MRI from January revealed:  L3-L4: Increased circumferential disc bulging eccentric to the left with broad-based left subarticular component. Moderate facet and ligament flavum hypertrophy is mildly increased. Increased mild to moderate left lateral recess stenosis (descending left L4 nerve level). New mild left L3 neural foraminal stenosis. No spinal stenosis.  L4-L5: Chronic adjacent segment disease, but increased disc space loss (new vacuum disc), left eccentric circumferential disc bulging with broad-based posterior and biforaminal components of disc, and increased severe facet and ligament flavum hypertrophy since 20/10. Subsequent increased and now severe spinal and left greater than right lateral recess stenosis (descending L5 nerve levels). There is associated increased mild to moderate bilateral L4 neural foraminal stenosis, greater on the right.  She is meeiting with ortho this week and may opt for surgery.      Pain Inventory Average Pain 5 Pain Right Now 4 My pain is intermittent and sharp  In the last 24 hours, has pain interfered with the following? General activity 5 Relation with others 3 Enjoyment of life 7 What TIME of day is your pain at its worst? daytime Sleep (in general) Fair  Pain is worse with: walking, sitting and standing Pain improves with: rest, heat/ice and medication Relief from Meds: 6  Mobility how many minutes can you walk? 5 Do you have any goals in this area?  yes  Function Do you have any goals in this area?  no  Neuro/Psych No problems in this area  Prior  Studies Any changes since last visit?  no  Physicians involved in your care Any changes since last visit?  no   Family History  Problem Relation Age of Onset  . Diabetes Mother   . Heart disease Mother   . Stroke Mother   . Heart disease Father   . Heart disease Brother    Social History   Socioeconomic History  . Marital status: Widowed    Spouse name: Not on file  . Number of children: Not on file  . Years of education: Not on file  . Highest education level: Not on file  Occupational History  . Not on file  Social Needs  . Financial resource strain: Not on file  . Food insecurity:    Worry: Not on file    Inability: Not on file  . Transportation needs:    Medical: Not on file    Non-medical: Not on file  Tobacco Use  . Smoking status: Former Smoker    Packs/day: 0.50    Years: 5.00    Pack years: 2.50    Types: Cigarettes    Last attempt to quit: 03/15/1968    Years since quitting: 49.6  . Smokeless tobacco: Never Used  Substance and Sexual Activity  . Alcohol use: Yes    Alcohol/week: 1.0 standard drinks    Types: 1 Glasses of wine per week    Comment: occasional glass of wine  . Drug use: No  . Sexual activity: Not on file  Lifestyle  . Physical activity:    Days per week: Not on  file    Minutes per session: Not on file  . Stress: Not on file  Relationships  . Social connections:    Talks on phone: Not on file    Gets together: Not on file    Attends religious service: Not on file    Active member of club or organization: Not on file    Attends meetings of clubs or organizations: Not on file    Relationship status: Not on file  Other Topics Concern  . Not on file  Social History Narrative  . Not on file   Past Surgical History:  Procedure Laterality Date  . RETINAL DETACHMENT SURGERY  9/13   Past Medical History:  Diagnosis Date  . Cervical facet syndrome   . Depression   . Disorders of sacrum   . Enthesopathy of hip region   . Headache    . Hypertension   . Sciatic neuritis   . Synovitis and tenosynovitis   . Vision abnormalities    BP 137/83   Pulse 75   Ht 5\' 3"  (1.6 m)   Wt 139 lb 12.8 oz (63.4 kg)   SpO2 93%   BMI 24.76 kg/m   Opioid Risk Score:   Fall Risk Score:  `1  Depression screen PHQ 2/9  Depression screen [facility] 2/9 10/12/2017 06/03/2017 04/08/2017 10/22/2015 05/23/2015 03/20/2015 02/21/2015  Decreased Interest 0 0 0 0 0 0 1  Down, Depressed, Hopeless 0 0 0 0 0 0 0  PHQ - 2 Score 0 0 0 0 0 0 1  Altered sleeping - 0 - - 0 - -  Tired, decreased energy - 0 - - 3 - -  Change in appetite - 0 - - 3 - -  Feeling bad or failure about yourself  - 0 - - 0 - -  Trouble concentrating - 0 - - 0 - -  Moving slowly or fidgety/restless - 0 - - 0 - -  Suicidal thoughts - 0 - - 0 - -  PHQ-9 Score - 0 - - 6 - -   Review of Systems  Constitutional: Negative.   HENT: Negative.   Eyes: Negative.   Respiratory: Negative.   Cardiovascular: Negative.   Gastrointestinal: Negative.   Endocrine: Negative.   Genitourinary: Negative.   Musculoskeletal: Negative.   Skin: Negative.   Allergic/Immunologic: Negative.   Neurological: Negative.   Hematological: Negative.   Psychiatric/Behavioral: Negative.   All other systems reviewed and are negative.      Objective:   Physical Exam General: No acute distress HEENT: EOMI, oral membranes moist Cards: reg rate  Chest: normal effort Abdomen: Soft, NT, ND Skin: dry, intact Extremities: no edema      Neurological:she is alert and appropriate.strength 5/5 UE, 3-4/5 RLE and 4/5 LLE. DTr's 1+ to trace in LE's.  Musc: Limited back rom in all planes, ext worse than flexion. Wide based gait. Slow to transfer Skin: Skin is warmand dry.  Psychiatric:pleasant .         1. Post laminectomy syndrome with right buttock pain. Pt with worsening central stenosis on MRI with neurogenic claudication:  -continue fentanyl patch 55mcg  every 72 hours, #10.  Continue  Percocet 10/325 #change to #75  -We will continue the controlled substance monitoring program, this consists of regular clinic visits, examinations, routine drug screening, pill counts as well as use of [place] Controlled Substance Reporting System. [place] today.   -consider MBB's at L4-5?? She will discuss options with Dr.  Nitka 2. Neuropathic Pain/right sided lumbar radicular pain:  -MAINTAIN gabapentin to 600mg  QID.  -continue HEP as possible 3. Gait Disorder: Continue HEP /Neurology Following          -May benefit from the use of a straight cane in the short-term to help with balance and to additionally assist with her posture in standing and walking.  4. Myoclonus with sleep: Continue Klonopin 5. Constipation opioid induced:  scheduled senokot-s  42minutes of face to face patient care time was spent during this visit. All questions were encouraged and answered.

## 2017-10-12 NOTE — Patient Instructions (Signed)
WE CAN CONSIDER MEDIAL BRANCH BLOCKS FOR YOUR FACET DISEASE IF YOU WANT TO HOLD OFF ON SURGERY. JUST LET ME KNOW.

## 2017-10-15 ENCOUNTER — Encounter (INDEPENDENT_AMBULATORY_CARE_PROVIDER_SITE_OTHER): Payer: Self-pay | Admitting: Specialist

## 2017-10-15 ENCOUNTER — Ambulatory Visit (INDEPENDENT_AMBULATORY_CARE_PROVIDER_SITE_OTHER): Payer: Medicare HMO | Admitting: Specialist

## 2017-10-15 VITALS — BP 122/77 | HR 72 | Ht 63.0 in | Wt 138.0 lb

## 2017-10-15 DIAGNOSIS — M4316 Spondylolisthesis, lumbar region: Secondary | ICD-10-CM | POA: Diagnosis not present

## 2017-10-15 DIAGNOSIS — M48062 Spinal stenosis, lumbar region with neurogenic claudication: Secondary | ICD-10-CM

## 2017-10-15 NOTE — Progress Notes (Addendum)
Office Visit Note   Patient: Alexandria Mclaughlin           Date of Birth: [birth year]           MRN: [id number] Visit Date: 10/15/2017              Requested by: Merrilee Seashore, [address] [place],  [id number] PCP: Merrilee Seashore, MD   Assessment & Plan: Visit Diagnoses:  1. Spinal stenosis of lumbar region with neurogenic claudication   2. Spondylolisthesis, lumbar region    77 year old female with severe lumbar spinal stenosis with spondylolisthesis L4-5 above previous L5-S1 fusion. She is experiencing claudication symptoms with prolong standing and walking. Leaning on grocery carts. There is weakness in the right foot DF. She wants to be able to go to [place] in [place] and be able to walk distances I saw Dr. Tessa Lerner earlier this Week and he was considering facet blocks to determine if she might see improvement with RFA of medial branch to the lumbar facets at L4-5. Reviewing her studies she has severe central canal stenosis L4-5, right foot DF weakness, plain radiographs from 03/2017 demonstrating a dynamiic spondylolisthesis at L4-5 so that facet block and RFA may help the  Arthritis pain in the back but is unlikely to improve leg symptoms and claudication or the right leg weakness. I would recommend she consider surgical decompression and fusion at L4-5. There are disc changes at the remaining lumbar segments and no guarantee that these areas will not cause problems in the further, the risk is about 20-40 % that further surgeries at other level may be indicated in the future. We will discuss further at her return visit in October.   Plan: Avoid bending, stooping and avoid lifting weights greater than 10 lbs. Avoid prolong standing and walking. Order for a new walker with wheels. Surgery scheduling secretary Kandice Hams, will call you in the next week to schedule for surgery.  Surgery recommended is a two level lumbar fusion L4-5 this would be done with  rods, screws and cage with local bone graft and allograft (donor bone graft). Take hydrocodone for for pain. Risk of surgery includes risk of infection 1 in 300 patients, bleeding 1/2% chance you would need a transfusion.   Risk to the nerves is one in 10,000. You will need to use a brace for 3 months and wean from the brace on the 4th month. Expect improved walking and standing tolerance. Expect relief of leg pain but numbness may persist depending on the length and degree of pressure that has been present.  Continue with hydrocodone for pain.  Follow-Up Instructions: Return in about 6 weeks (around 11/26/2017).   Orders:  No orders of the defined types were placed in this encounter.  No orders of the defined types were placed in this encounter.     Procedures: No procedures performed   Clinical Data: No additional findings.   Subjective: Chief Complaint  Patient presents with  . Lower Back - Pain, Follow-up    77 year old female with history of L5-S1 fusion with pedicle screws and rods, eventual removal of the L5-S1 hardware with persistent pain in the lumbar spine. She has had worsening pain with standing and walking. She has recently had her friend in the hospital and    Review of Systems  Constitutional: Negative.   HENT: Negative.   Eyes: Negative.   Respiratory: Negative.   Cardiovascular: Negative.   Gastrointestinal: Negative.  Endocrine: Negative.   Genitourinary: Negative.   Musculoskeletal: Negative.   Skin: Negative.   Allergic/Immunologic: Negative.   Neurological: Negative.   Hematological: Negative.   Psychiatric/Behavioral: Negative.      Objective: Vital Signs: BP 122/77 (BP Location: Left Arm, Patient Position: Sitting, Cuff Size: Normal)   Pulse 72   Ht 5\' 3"  (1.6 m)   Wt 138 lb (62.6 kg)   BMI 24.45 kg/m   Physical Exam  Constitutional: She is oriented to person, place, and time. She appears well-developed and well-nourished.  HENT:    Head: Normocephalic and atraumatic.  Eyes: Pupils are equal, round, and reactive to light. EOM are normal.  Neck: Normal range of motion. Neck supple.  Pulmonary/Chest: Effort normal and breath sounds normal.  Abdominal: Soft. Bowel sounds are normal.  Neurological: She is alert and oriented to person, place, and time.  Skin: Skin is warm and dry.  Psychiatric: She has a normal mood and affect. Her behavior is normal. Judgment and thought content normal.    Back Exam   Tenderness  The patient is experiencing tenderness in the lumbar.  Range of Motion  Extension: abnormal  Flexion: normal  Lateral bend right: abnormal  Lateral bend left: abnormal  Rotation right: abnormal  Rotation left: abnormal   Muscle Strength  Right Quadriceps:  5/5  Left Quadriceps:  5/5  Right Hamstrings:  5/5  Left Hamstrings:  5/5   Tests  Straight leg raise right: negative Straight leg raise left: negative  Reflexes  Patellar: abnormal Achilles: normal Babinski's sign: normal   Other  Toe walk: abnormal Heel walk: abnormal Sensation: decreased Gait: drop-foot  Erythema: no back redness Scars: present  Comments:  Right foot DF weakness 4/5, SLR negative.      Specialty Comments:  No specialty comments available.  Imaging: No results found.   PMFS History: Patient Active Problem List   Diagnosis Date Noted  . Lumbar radicular pain 07/29/2016  . Myoclonus 09/19/2015  . Therapeutic opioid induced constipation 07/25/2015  . White matter changes 02/07/2015  . Essential hypertension 02/07/2015  . HLD (hyperlipidemia) 02/07/2015  . Low back pain 02/07/2015  . BPPV (benign paroxysmal positional vertigo) 02/07/2015  . Abnormality of gait 02/07/2015  . Lumbar facet arthropathy 07/05/2014  . Acquired unequal leg length on left 08/08/2011  . Lumbar post-laminectomy syndrome 06/11/2011  . Sacroiliac joint dysfunction 06/11/2011   Past Medical History:  Diagnosis Date  .  Cervical facet syndrome   . Depression   . Disorders of sacrum   . Enthesopathy of hip region   . Headache   . Hypertension   . Sciatic neuritis   . Synovitis and tenosynovitis   . Vision abnormalities     Family History  Problem Relation Age of Onset  . Diabetes Mother   . Heart disease Mother   . Stroke Mother   . Heart disease Father   . Heart disease Brother     Past Surgical History:  Procedure Laterality Date  . RETINAL DETACHMENT SURGERY  9/13   Social History   Occupational History  . Not on file  Tobacco Use  . Smoking status: Former Smoker    Packs/day: 0.50    Years: 5.00    Pack years: 2.50    Types: Cigarettes    Last attempt to quit: 03/15/1968    Years since quitting: 49.6  . Smokeless tobacco: Never Used  Substance and Sexual Activity  . Alcohol use: Yes    Alcohol/week: 1.0 standard  drinks    Types: 1 Glasses of wine per week    Comment: occasional glass of wine  . Drug use: No  . Sexual activity: Not on file

## 2017-10-15 NOTE — Patient Instructions (Addendum)
Avoid bending, stooping and avoid lifting weights greater than 10 lbs. Avoid prolong standing and walking. Order for a new walker with wheels. Surgery scheduling secretary Kandice Hams, will call you in the next week to schedule for surgery.  Surgery recommended is a two level lumbar fusion L4-5 this would be done with rods, screws and cage with local bone graft and allograft (donor bone graft). Take hydrocodone for for pain. Risk of surgery includes risk of infection 1 in 300 patients, bleeding 1/2% chance you would need a transfusion.   Risk to the nerves is one in 10,000. You will need to use a brace for 3 months and wean from the brace on the 4th month. Expect improved walking and standing tolerance. Expect relief of leg pain but numbness may persist depending on the length and degree of pressure that has been present.  Continue with hydrocodone for pain.

## 2017-10-16 ENCOUNTER — Other Ambulatory Visit: Payer: Self-pay | Admitting: Physical Medicine & Rehabilitation

## 2017-10-16 DIAGNOSIS — G253 Myoclonus: Secondary | ICD-10-CM

## 2017-10-16 LAB — TOXASSURE SELECT,+ANTIDEPR,UR

## 2017-10-16 MED ORDER — CLONAZEPAM 0.5 MG PO TABS: 0.5000 mg | ORAL_TABLET | Freq: Every day | ORAL | 1 refills | Status: DC

## 2017-10-16 NOTE — Addendum Note (Signed)
Addended by: Marland Mcalpine B on: 10/16/2017 01:34 PM   Modules accepted: Orders

## 2017-10-20 ENCOUNTER — Telehealth: Payer: Self-pay | Admitting: *Deleted

## 2017-10-20 NOTE — Telephone Encounter (Signed)
Urine drug screen for this encounter is consistent for prescribed medication 

## 2017-10-23 NOTE — Telephone Encounter (Signed)
Question from patient 

## 2017-10-27 DIAGNOSIS — H524 Presbyopia: Secondary | ICD-10-CM | POA: Diagnosis not present

## 2017-10-27 DIAGNOSIS — H5213 Myopia, bilateral: Secondary | ICD-10-CM | POA: Diagnosis not present

## 2017-10-27 DIAGNOSIS — H532 Diplopia: Secondary | ICD-10-CM | POA: Diagnosis not present

## 2017-10-27 DIAGNOSIS — H43813 Vitreous degeneration, bilateral: Secondary | ICD-10-CM | POA: Diagnosis not present

## 2017-10-27 DIAGNOSIS — H52223 Regular astigmatism, bilateral: Secondary | ICD-10-CM | POA: Diagnosis not present

## 2017-10-27 DIAGNOSIS — Z961 Presence of intraocular lens: Secondary | ICD-10-CM | POA: Diagnosis not present

## 2017-10-27 DIAGNOSIS — H40023 Open angle with borderline findings, high risk, bilateral: Secondary | ICD-10-CM | POA: Diagnosis not present

## 2017-10-27 DIAGNOSIS — H33051 Total retinal detachment, right eye: Secondary | ICD-10-CM | POA: Diagnosis not present

## 2017-10-30 NOTE — Telephone Encounter (Signed)
Please arrange bilateral MBB L3-L4/L4-L5 with Dr. Letta Pate.   thanks

## 2017-11-11 ENCOUNTER — Ambulatory Visit (INDEPENDENT_AMBULATORY_CARE_PROVIDER_SITE_OTHER): Payer: Medicare HMO | Admitting: Specialist

## 2017-11-17 ENCOUNTER — Encounter: Payer: Self-pay | Admitting: Physical Medicine & Rehabilitation

## 2017-11-17 ENCOUNTER — Ambulatory Visit (HOSPITAL_BASED_OUTPATIENT_CLINIC_OR_DEPARTMENT_OTHER): Payer: Medicare HMO | Admitting: Physical Medicine & Rehabilitation

## 2017-11-17 ENCOUNTER — Encounter: Payer: Medicare HMO | Attending: Physical Medicine and Rehabilitation

## 2017-11-17 VITALS — BP 132/84 | HR 81 | Ht 63.0 in | Wt 138.0 lb

## 2017-11-17 DIAGNOSIS — M2170 Unequal limb length (acquired), unspecified site: Secondary | ICD-10-CM | POA: Insufficient documentation

## 2017-11-17 DIAGNOSIS — M533 Sacrococcygeal disorders, not elsewhere classified: Secondary | ICD-10-CM | POA: Diagnosis not present

## 2017-11-17 DIAGNOSIS — M47816 Spondylosis without myelopathy or radiculopathy, lumbar region: Secondary | ICD-10-CM

## 2017-11-17 NOTE — Progress Notes (Signed)
Subjective:    Patient ID: Alexandria Mclaughlin, female    DOB: December 16, 1940, 77 y.o.   MRN: [id number]  HPI  CC:  Low back pain- worsening  77 yo pt with hx L5-S1 fusion 2004, who c/o increasing low back pain over the last several months.Pt had RLE pain prior to first surgery.  She has been managed with chronic opioids for many years.   She had some RLE shooting pain which improved with hemp tablets.   MRI Jan 2019- showed increased L3-4 L4-5 facet joint arthropathy and L4-5 stenosis.  The pt has seen ortho spine surgery who recommended surgical decompression but pt is not sure she wishes to undergo surgery again Pt now states back pain is the major problem that is limiting her ambulation.  Also has some right leg weakness when walking longer distance >2blks  No numbness in RIght leg, occ in foot  Pain Inventory Average Pain 4 Pain Right Now 3 My pain is intermittent, sharp and stabbing  In the last 24 hours, has pain interfered with the following? General activity 4 Relation with others 4 Enjoyment of life 5 What TIME of day is your pain at its worst? daytime Sleep (in general) Good  Pain is worse with: walking, sitting and standing Pain improves with: rest, heat/ice and medication Relief from Meds: 7  Mobility Do you have any goals in this area?  no  Function Do you have any goals in this area?  no  Neuro/Psych weakness trouble walking  Prior Studies Any changes since last visit?  no  Physicians involved in your care Any changes since last visit?  no   Family History  Problem Relation Age of Onset  . Diabetes Mother   . Heart disease Mother   . Stroke Mother   . Heart disease Father   . Heart disease Brother    Social History   Socioeconomic History  . Marital status: Widowed    Spouse name: Not on file  . Number of children: Not on file  . Years of education: Not on file  . Highest education level: Not on file  Occupational History  . Not on file  Social  Needs  . Financial resource strain: Not on file  . Food insecurity:    Worry: Not on file    Inability: Not on file  . Transportation needs:    Medical: Not on file    Non-medical: Not on file  Tobacco Use  . Smoking status: Former Smoker    Packs/day: 0.50    Years: 5.00    Pack years: 2.50    Types: Cigarettes    Last attempt to quit: 03/15/1968    Years since quitting: 49.7  . Smokeless tobacco: Never Used  Substance and Sexual Activity  . Alcohol use: Yes    Alcohol/week: 1.0 standard drinks    Types: 1 Glasses of wine per week    Comment: occasional glass of wine  . Drug use: No  . Sexual activity: Not on file  Lifestyle  . Physical activity:    Days per week: Not on file    Minutes per session: Not on file  . Stress: Not on file  Relationships  . Social connections:    Talks on phone: Not on file    Gets together: Not on file    Attends religious service: Not on file    Active member of club or organization: Not on file    Attends meetings of clubs  or organizations: Not on file    Relationship status: Not on file  Other Topics Concern  . Not on file  Social History Narrative  . Not on file   Past Surgical History:  Procedure Laterality Date  . RETINAL DETACHMENT SURGERY  9/13   Past Medical History:  Diagnosis Date  . Cervical facet syndrome   . Depression   . Disorders of sacrum   . Enthesopathy of hip region   . Headache   . Hypertension   . Sciatic neuritis   . Synovitis and tenosynovitis   . Vision abnormalities    BP 132/84   Pulse 81   Ht 5\' 3"  (1.6 m)   Wt 138 lb (62.6 kg)   SpO2 95%   BMI 24.45 kg/m   Opioid Risk Score:   Fall Risk Score:  `1  Depression screen PHQ 2/9  Depression screen [facility] 2/9 10/12/2017 06/03/2017 04/08/2017 10/22/2015 05/23/2015 03/20/2015 02/21/2015  Decreased Interest 0 0 0 0 0 0 1  Down, Depressed, Hopeless 0 0 0 0 0 0 0  PHQ - 2 Score 0 0 0 0 0 0 1  Altered sleeping - 0 - - 0 - -  Tired, decreased energy - 0 -  - 3 - -  Change in appetite - 0 - - 3 - -  Feeling bad or failure about yourself  - 0 - - 0 - -  Trouble concentrating - 0 - - 0 - -  Moving slowly or fidgety/restless - 0 - - 0 - -  Suicidal thoughts - 0 - - 0 - -  PHQ-9 Score - 0 - - 6 - -     Review of Systems  Constitutional: Negative.   HENT: Negative.   Eyes: Negative.   Respiratory: Negative.   Cardiovascular: Negative.   Gastrointestinal: Negative.   Endocrine: Negative.   Genitourinary: Negative.   Musculoskeletal: Positive for back pain and gait problem.  Skin: Negative.   Allergic/Immunologic: Negative.   Neurological: Positive for weakness.  Hematological: Negative.   Psychiatric/Behavioral: Negative.   All other systems reviewed and are negative.      Objective:   Physical Exam  Neurological: A sensory deficit is present.  Reflex Scores:      Patellar reflexes are 2+ on the right side and 2+ on the left side.      Achilles reflexes are 0 on the right side and 1+ on the left side. RIght S1 with reduced pp   Motor 5/5 in Bilateral HF, KE, ADF - SLR Lumbar ROM flexion is 50-75%, Extension is 0 due to pain Gait shows no foot drop or knee instability , no assistive device needed  Pain to palpation at L4-5 paraspinal area     Assessment & Plan:  1.  Lumbar axial pain as primary concern with mobility and pain, agree with L2-3, L3-4 medial branch block to anesthetize L3-4 , L4-5 facet joints.  Further treatment would be based on response to injection  Pt states she will continue her hemp pills for the sciatic pain down her Right leg.  This is no longer a primary concern.

## 2017-11-20 ENCOUNTER — Telehealth: Payer: Self-pay | Admitting: *Deleted

## 2017-11-20 NOTE — Telephone Encounter (Signed)
Alexandria Mclaughlin has called and is asking if the injection is going to be done at her appt 12/04/17. According to appt note it has been approved. Alexandria Mclaughlin notified.

## 2017-11-25 DIAGNOSIS — M79642 Pain in left hand: Secondary | ICD-10-CM | POA: Diagnosis not present

## 2017-11-25 DIAGNOSIS — R69 Illness, unspecified: Secondary | ICD-10-CM | POA: Diagnosis not present

## 2017-11-27 NOTE — Telephone Encounter (Signed)
Question about medication

## 2017-12-03 ENCOUNTER — Ambulatory Visit (INDEPENDENT_AMBULATORY_CARE_PROVIDER_SITE_OTHER): Payer: Medicare HMO | Admitting: Specialist

## 2017-12-04 ENCOUNTER — Encounter: Payer: Self-pay | Admitting: Physical Medicine & Rehabilitation

## 2017-12-04 ENCOUNTER — Ambulatory Visit (HOSPITAL_BASED_OUTPATIENT_CLINIC_OR_DEPARTMENT_OTHER): Payer: Medicare HMO | Admitting: Physical Medicine & Rehabilitation

## 2017-12-04 VITALS — BP 152/90 | HR 76 | Ht 63.0 in | Wt 139.0 lb

## 2017-12-04 DIAGNOSIS — M533 Sacrococcygeal disorders, not elsewhere classified: Secondary | ICD-10-CM | POA: Diagnosis not present

## 2017-12-04 DIAGNOSIS — M47816 Spondylosis without myelopathy or radiculopathy, lumbar region: Secondary | ICD-10-CM

## 2017-12-04 DIAGNOSIS — M2170 Unequal limb length (acquired), unspecified site: Secondary | ICD-10-CM | POA: Insufficient documentation

## 2017-12-04 NOTE — Progress Notes (Addendum)
Right lumbar L2,  L3, L4 medial branch blocks  under fluoroscopic guidance  Indication: Right Lumbar Axial pain which is not relieved by medication management or other conservative care and interfering with self-care and mobility.  Informed consent was obtained after describing risks and benefits of the procedure with the patient, this includes bleeding, bruising, infection, paralysis and medication side effects. The patient wishes to proceed and has given written consent. The patient was placed in a prone position. The lumbar area was marked and prepped with Betadine. One ML of 1% lidocaine was injected into each of 3 areas into the skin and subcutaneous tissue. Then a 22-gauge 3.5in spinal needle was inserted targeting the junction of the Right L3 SAP/transverse process  junction. Needle was advanced under fluoroscopic guidance. Bone contact was made.Isovue 200 was injected x0.5 mL demonstrating no intravascular uptake. Then a solution containing 2% MPF lidocaine was injected x0.5 mL. Then the Right L5 superior articular process in transverse process junction was targeted. Bone contact was made.Isovue 200 was injected x0.5 mL demonstrating no intravascular uptake. Then a solution containing 2% MPF lidocaine was injected x0.5 mL. Then the Right L4 superior articular process in transverse process junction was targeted. Bone contact was made. Isovue 200 was injected x0.5 mL demonstrating no intravascular uptake. Then a solution containing2% MPF lidocaine was injected x0.5 mL Patient tolerated procedure well. Post procedure instructions were given. Please refer to post procedure form.  Preinjection pain 4/10 Post injection pain 1/10  Based on improvement of lumbar axial pain of greater than 50% preinjection to post injection, I will recommend repeat injectio n when current relief has diminished.If a second set of RIght L2-3-4 medial branch blocks relieves pain by 50% oe more would recommend radiofrequency  ablation of RIght L2,3,4 medial branches.  Pt has chronic pain related to several diagnoses but this procedure is being used to treat M47.816

## 2017-12-04 NOTE — Progress Notes (Signed)
  PROCEDURE RECORD [facility]   Name: Alexandria Mclaughlin DOB:March 02, 1940 MRN: [id number]  Date:12/04/2017  Physician: Alysia Penna, MD    Nurse/CMA: Janeann Paisley CMA  Allergies: No Known Allergies  Consent Signed: Yes.    Is patient diabetic? No.  CBG today? NA  Pregnant: No. LMP: No LMP recorded. Patient has had a hysterectomy. (age [age])  Anticoagulants: no Anti-inflammatory: yes (ibuprofen, this AM) Antibiotics: no  Procedure: Right L2-4 Medial Branch Blocks  Position: Prone   Start Time: 2:10pm End Time: 216pm Fluoro Time: 26s   RN/CMA Khalik Pewitt CMA Brinly Maietta CMA    Time 1:55pm 210pm    BP 152/90 155/86    Pulse 76 73    Respirations 16 16    O2 Sat 93 95    S/S 6 6    Pain Level 4/10 1/10     D/C home with [name], patient A & O X 3, D/C instructions reviewed, and sits independently.

## 2017-12-04 NOTE — Patient Instructions (Signed)

## 2017-12-08 ENCOUNTER — Other Ambulatory Visit: Payer: Self-pay | Admitting: Physical Medicine & Rehabilitation

## 2017-12-08 DIAGNOSIS — M5416 Radiculopathy, lumbar region: Secondary | ICD-10-CM

## 2017-12-08 DIAGNOSIS — M961 Postlaminectomy syndrome, not elsewhere classified: Secondary | ICD-10-CM

## 2017-12-08 DIAGNOSIS — Z0100 Encounter for examination of eyes and vision without abnormal findings: Secondary | ICD-10-CM | POA: Diagnosis not present

## 2017-12-09 ENCOUNTER — Encounter: Payer: Self-pay | Admitting: Physical Medicine & Rehabilitation

## 2017-12-09 ENCOUNTER — Encounter: Payer: Medicare HMO | Attending: Physical Medicine and Rehabilitation | Admitting: Physical Medicine & Rehabilitation

## 2017-12-09 VITALS — BP 117/79 | HR 72 | Ht 63.0 in | Wt 142.0 lb

## 2017-12-09 DIAGNOSIS — M961 Postlaminectomy syndrome, not elsewhere classified: Secondary | ICD-10-CM

## 2017-12-09 DIAGNOSIS — M533 Sacrococcygeal disorders, not elsewhere classified: Secondary | ICD-10-CM

## 2017-12-09 DIAGNOSIS — M47816 Spondylosis without myelopathy or radiculopathy, lumbar region: Secondary | ICD-10-CM | POA: Diagnosis not present

## 2017-12-09 DIAGNOSIS — G894 Chronic pain syndrome: Secondary | ICD-10-CM

## 2017-12-09 DIAGNOSIS — M5416 Radiculopathy, lumbar region: Secondary | ICD-10-CM | POA: Diagnosis not present

## 2017-12-09 DIAGNOSIS — M19041 Primary osteoarthritis, right hand: Secondary | ICD-10-CM

## 2017-12-09 DIAGNOSIS — M19042 Primary osteoarthritis, left hand: Secondary | ICD-10-CM

## 2017-12-09 DIAGNOSIS — M2170 Unequal limb length (acquired), unspecified site: Secondary | ICD-10-CM | POA: Diagnosis not present

## 2017-12-09 MED ORDER — OXYCODONE-ACETAMINOPHEN 10-325 MG PO TABS: 1.0000 | ORAL_TABLET | Freq: Two times a day (BID) | ORAL | 0 refills | Status: DC | PRN

## 2017-12-09 MED ORDER — FENTANYL 25 MCG/HR TD PT72: 25.0000 ug | MEDICATED_PATCH | TRANSDERMAL | 0 refills | Status: DC

## 2017-12-09 MED ORDER — DICLOFENAC SODIUM 1 % TD GEL: 1.0000 "application " | Freq: Three times a day (TID) | TRANSDERMAL | 4 refills | Status: DC

## 2017-12-09 NOTE — Patient Instructions (Addendum)
PLEASE FEEL FREE TO CALL OUR OFFICE WITH ANY PROBLEMS OR QUESTIONS ([phone])     DAILY STRETCHING FOR YOUR BACK AND PELVIS ESPECIALLY BEFORE AND AFTER WALKING/EXERCISE

## 2017-12-09 NOTE — Progress Notes (Signed)
Subjective:    Patient ID: Alexandria Mclaughlin, female    DOB: 08/21/1940, 77 y.o.   MRN: [id number]  HPI   Alexandria Mclaughlin is here in follow up of her chronic pain. She had nice results with her MBB's and pain is down to a 2/10 today. She is having som right piriformis pain and her hands are tender from osteoarthritis especially on the left side.  She does not recall any excessive activity or stress on her hands.  She is trying to walk more and is up to about half a mile at a time currently.  She is doing some stretching but is not regular with it.  .  For pain she is using a fentanyl patch 25 mcg to 72 hours and Percocet 10 one twice a day as needed.  Pain Inventory Average Pain 2 Pain Right Now 2 My pain is intermittent, sharp, stabbing and aching  In the last 24 hours, has pain interfered with the following? General activity 3 Relation with others 4 Enjoyment of life 4 What TIME of day is your pain at its worst? daytime Sleep (in general) Good  Pain is worse with: walking and standing Pain improves with: rest, heat/ice, medication and injections Relief from Meds: 6  Mobility walk without assistance do you drive?  yes  Function Do you have any goals in this area?  no  Neuro/Psych trouble walking  Prior Studies Any changes since last visit?  no  Physicians involved in your care Any changes since last visit?  no   Family History  Problem Relation Age of Onset  . Diabetes Mother   . Heart disease Mother   . Stroke Mother   . Heart disease Father   . Heart disease Brother    Social History   Socioeconomic History  . Marital status: Widowed    Spouse name: Not on file  . Number of children: Not on file  . Years of education: Not on file  . Highest education level: Not on file  Occupational History  . Not on file  Social Needs  . Financial resource strain: Not on file  . Food insecurity:    Worry: Not on file    Inability: Not on file  . Transportation needs:   Medical: Not on file    Non-medical: Not on file  Tobacco Use  . Smoking status: Former Smoker    Packs/day: 0.50    Years: 5.00    Pack years: 2.50    Types: Cigarettes    Last attempt to quit: 03/15/1968    Years since quitting: 49.7  . Smokeless tobacco: Never Used  Substance and Sexual Activity  . Alcohol use: Yes    Alcohol/week: 1.0 standard drinks    Types: 1 Glasses of wine per week    Comment: occasional glass of wine  . Drug use: No  . Sexual activity: Not on file  Lifestyle  . Physical activity:    Days per week: Not on file    Minutes per session: Not on file  . Stress: Not on file  Relationships  . Social connections:    Talks on phone: Not on file    Gets together: Not on file    Attends religious service: Not on file    Active member of club or organization: Not on file    Attends meetings of clubs or organizations: Not on file    Relationship status: Not on file  Other Topics Concern  . Not  on file  Social History Narrative  . Not on file   Past Surgical History:  Procedure Laterality Date  . RETINAL DETACHMENT SURGERY  9/13   Past Medical History:  Diagnosis Date  . Cervical facet syndrome   . Depression   . Disorders of sacrum   . Enthesopathy of hip region   . Headache   . Hypertension   . Sciatic neuritis   . Synovitis and tenosynovitis   . Vision abnormalities    There were no vitals taken for this visit.  Opioid Risk Score:   Fall Risk Score:  `1  Depression screen PHQ 2/9  Depression screen [facility] 2/9 10/12/2017 06/03/2017 04/08/2017 10/22/2015 05/23/2015 03/20/2015 02/21/2015  Decreased Interest 0 0 0 0 0 0 1  Down, Depressed, Hopeless 0 0 0 0 0 0 0  PHQ - 2 Score 0 0 0 0 0 0 1  Altered sleeping - 0 - - 0 - -  Tired, decreased energy - 0 - - 3 - -  Change in appetite - 0 - - 3 - -  Feeling bad or failure about yourself  - 0 - - 0 - -  Trouble concentrating - 0 - - 0 - -  Moving slowly or fidgety/restless - 0 - - 0 - -  Suicidal  thoughts - 0 - - 0 - -  PHQ-9 Score - 0 - - 6 - -     Review of Systems  Constitutional: Negative.   HENT: Negative.   Eyes: Negative.   Respiratory: Negative.   Cardiovascular: Negative.   Gastrointestinal: Negative.   Endocrine: Negative.   Genitourinary: Negative.   Musculoskeletal: Positive for arthralgias, gait problem and myalgias.  Skin: Negative.   Allergic/Immunologic: Negative.   Hematological: Negative.   Psychiatric/Behavioral: Negative.   All other systems reviewed and are negative.      Objective:   Physical Exam  General: No acute distress HEENT: EOMI, oral membranes moist Cards: reg rate  Chest: normal effort Abdomen: Soft, NT, ND Skin: dry, intact Extremities: no edema   Neurological:she is alert and appropriate.strength 5/5UE, 3-4/5 RLE and 4/5 LLE. DTr's 1+ to trace in LE's.  Musc: Much improved lumbar range of motion.  Less pain with extension today.  Does have pain along the right piriformis area worse with palpation and internal rotation of the hip .  Gait is fairly stable and normal stride length and width. Psychiatric:pleasant .         1. Post laminectomy syndrome with right buttock pain. Pt with worseningcentralstenosis on MRIwith neurogenic claudication:  -continue fentanyl patch 35mcg every 72 hours, #10. ContinuePercocet 10/325 #75 -We will continue the controlled substance monitoring program, this consists of regular clinic visits, examinations, routine drug screening, pill counts as well as use of [place] Controlled Substance Reporting System. [place] today.   -Medication was refilled and a second prescription was sent to the patient's pharmacy for next month.   -Consider lumbar radiofrequency ablation depending on duration of relief with the current medial branch blocks.  She is done quite well with those so far. 2. Neuropathic Pain/right sided lumbar radicular pain:  -Continue  gabapentin to 600mg  QID. -continue HEP as possible 3. Gait Disorder: Continue HEP /Neurology Following -Continue straight cane for balance if needed  4. Myoclonus with sleep: Continue Klonopin 5. Constipation opioid induced: scheduled senokot-s 6.  Osteoarthritis of hands.  Begin trial of Voltaren gel 3 times daily.  Also discussed other  over-the-counter supplements that she  can try.  67minutes of face to face patient care time was spent during this visit. All questions were encouraged and answered. Follow-up with me in about 2 months time

## 2017-12-10 ENCOUNTER — Telehealth: Payer: Self-pay

## 2017-12-10 NOTE — Telephone Encounter (Signed)
Recieved emailed notification and fax notification that this patient needed a prior auth for diclofenac sodium gel.  Started and completed it this morning and it has returned approved.

## 2017-12-21 ENCOUNTER — Other Ambulatory Visit: Payer: Self-pay | Admitting: Physical Medicine & Rehabilitation

## 2017-12-28 NOTE — Telephone Encounter (Signed)
Message from patient

## 2017-12-29 ENCOUNTER — Encounter: Payer: Self-pay | Admitting: Physical Medicine & Rehabilitation

## 2017-12-29 ENCOUNTER — Encounter: Payer: Medicare HMO | Attending: Physical Medicine and Rehabilitation | Admitting: Physical Medicine & Rehabilitation

## 2017-12-29 VITALS — BP 146/78 | HR 80 | Ht 63.0 in | Wt 138.4 lb

## 2017-12-29 DIAGNOSIS — M2170 Unequal limb length (acquired), unspecified site: Secondary | ICD-10-CM | POA: Diagnosis not present

## 2017-12-29 DIAGNOSIS — M533 Sacrococcygeal disorders, not elsewhere classified: Secondary | ICD-10-CM | POA: Diagnosis not present

## 2017-12-29 DIAGNOSIS — M7521 Bicipital tendinitis, right shoulder: Secondary | ICD-10-CM

## 2017-12-29 NOTE — Progress Notes (Signed)
PROCEDURE NOTE  DIAGNOSIS: right biceps tendonitis  INTERVENTION:  Peri-tendon injection     After informed consent and preparation of the skin with betadine and isopropyl alcohol, I injected 6mg  (1cc) of celestone and 4cc of 1% lidocaine around the  the left short head biceps tendon via anterior approach. Additionally, aspiration was performed prior to injection. The patient tolerated well, and no complications were encountered. Afterward the area was cleaned and dressed. Post- injection instructions were provided.   I provided her exercises for the right shoulder     Meredith Staggers, MD, [facility] 12/29/2017

## 2017-12-29 NOTE — Patient Instructions (Signed)
Biceps Tendon Tendinitis (Proximal) and Tenosynovitis Rehab Ask your health care provider which exercises are safe for you. Do exercises exactly as told by your health care provider and adjust them as directed. It is normal to feel mild stretching, pulling, tightness, or discomfort as you do these exercises, but you should stop right away if you feel sudden pain or your pain gets worse.Do not begin these exercises until told by your health care provider. Stretching and range of motion exercises These exercises warm up your muscles and joints and improve the movement and flexibility of your arm and shoulder. These exercises also help to relieve pain and stiffness. Exercise A: Shoulder flexion  1. Stand facing a wall. Put your left / right hand on the wall. 2. Slide your left / right hand up the wall. Stop when you feel a stretch in your shoulder, or when you reach the angle that is recommended by your health care provider. ? Use your other hand to help raise your arm, if needed. ? As your hand gets higher, you may need to step closer to the wall. ? Avoid shrugging your shoulder while you raise your arm. To do this, keep your shoulder blade tucked down toward your spine. 3. Hold for __________ seconds. 4. Slowly return to the starting position. Use your other arm to help, if needed. Repeat __________ times. Complete this exercise __________ times a day. Exercise B: Posterior capsule stretch ( passive horizontal adduction) 1. Sit or stand and pull your left / right elbow across your chest, toward your other shoulder. Stop when you feel a gentle stretch in the back of your shoulder and upper arm. ? Keep your arm at shoulder height. ? Keep your arm as close to your body as you comfortably can. 2. Hold for __________ seconds. 3. Slowly return to the starting position. Repeat __________ times. Complete this exercise __________ times a day. Strengthening exercises These exercises build strength and  endurance in your arm and shoulder. Endurance is the ability to use your muscles for a long time, even after your muscles get tired. Exercise C: Elbow flexion, supinated  1. Sit on a stable chair without armrests, or stand. 2. If directed, hold a __________ weight in your left / right hand, or hold an exercise band with both hands. Your palms should face up toward the ceiling at the starting position. 3. Bend your left / right elbow and move your hand up toward your shoulder. Keep your other arm straight down, in the starting position. 4. Slowly return to the starting position. Repeat __________ times. Complete this exercise __________ times a day. Exercise D: Scapular protraction, supine  1. Lie on your back on a firm surface. If directed, hold a __________ weight in your left / right hand. 2. Raise your left / right arm straight into the air so your hand is directly above your shoulder joint. 3. Push the weight into the air so your shoulder lifts off of the surface that you are lying on. Do not move your head, neck, or back. 4. Hold for __________ seconds. 5. Slowly return to the starting position. Let your muscles relax completely before you repeat this exercise. Repeat __________ times. Complete this exercise __________ times a day. Exercise E: Scapular retraction  1. Sit in a stable chair without armrests, or stand. 2. Secure an exercise band to a stable object in front of you so the band is at shoulder height. 3. Hold one end of the exercise band in   each hand. 4. Squeeze your shoulder blades together and move your elbows slightly behind you. Do not shrug your shoulders. 5. Hold for __________ seconds. 6. Slowly return to the starting position. Repeat __________ times. Complete this exercise __________ times a day. This information is not intended to replace advice given to you by your health care provider. Make sure you discuss any questions you have with your health care  provider. Document Released: 02/10/2005 Document Revised: 10/18/2015 Document Reviewed: 01/19/2015 Elsevier Interactive Patient Education  [employer].

## 2018-01-06 ENCOUNTER — Ambulatory Visit (INDEPENDENT_AMBULATORY_CARE_PROVIDER_SITE_OTHER): Payer: Medicare HMO | Admitting: Specialist

## 2018-01-14 DIAGNOSIS — R69 Illness, unspecified: Secondary | ICD-10-CM | POA: Diagnosis not present

## 2018-01-15 NOTE — Telephone Encounter (Signed)
Message from patient

## 2018-01-19 DIAGNOSIS — M62838 Other muscle spasm: Secondary | ICD-10-CM | POA: Diagnosis not present

## 2018-01-20 ENCOUNTER — Telehealth: Payer: Self-pay | Admitting: Registered Nurse

## 2018-01-20 MED ORDER — METHYLPREDNISOLONE 4 MG PO TBPK: ORAL_TABLET | ORAL | 0 refills | Status: DC

## 2018-01-20 NOTE — Telephone Encounter (Signed)
Placed a call to Ms. Yoon, no answer. Medrol Dose Pak sent to [facility] pharmacy.

## 2018-01-21 ENCOUNTER — Other Ambulatory Visit: Payer: Self-pay | Admitting: Physical Medicine & Rehabilitation

## 2018-01-21 DIAGNOSIS — G253 Myoclonus: Secondary | ICD-10-CM

## 2018-01-25 ENCOUNTER — Encounter

## 2018-01-25 ENCOUNTER — Other Ambulatory Visit: Payer: Self-pay

## 2018-01-25 ENCOUNTER — Encounter: Payer: Self-pay | Admitting: Physical Medicine & Rehabilitation

## 2018-01-25 ENCOUNTER — Encounter: Payer: Medicare HMO | Attending: Physical Medicine and Rehabilitation | Admitting: Physical Medicine & Rehabilitation

## 2018-01-25 VITALS — BP 153/89 | HR 89 | Ht 63.0 in | Wt 141.0 lb

## 2018-01-25 DIAGNOSIS — M961 Postlaminectomy syndrome, not elsewhere classified: Secondary | ICD-10-CM

## 2018-01-25 DIAGNOSIS — M5416 Radiculopathy, lumbar region: Secondary | ICD-10-CM

## 2018-01-25 DIAGNOSIS — M47816 Spondylosis without myelopathy or radiculopathy, lumbar region: Secondary | ICD-10-CM | POA: Diagnosis not present

## 2018-01-25 DIAGNOSIS — M2170 Unequal limb length (acquired), unspecified site: Secondary | ICD-10-CM | POA: Insufficient documentation

## 2018-01-25 DIAGNOSIS — M25511 Pain in right shoulder: Secondary | ICD-10-CM

## 2018-01-25 DIAGNOSIS — M533 Sacrococcygeal disorders, not elsewhere classified: Secondary | ICD-10-CM | POA: Diagnosis not present

## 2018-01-25 MED ORDER — FENTANYL 25 MCG/HR TD PT72: 25.0000 ug | MEDICATED_PATCH | TRANSDERMAL | 0 refills | Status: DC

## 2018-01-25 MED ORDER — CLONAZEPAM 0.5 MG PO TABS: ORAL_TABLET | ORAL | 1 refills | Status: DC

## 2018-01-25 MED ORDER — OXYCODONE-ACETAMINOPHEN 10-325 MG PO TABS: 1.0000 | ORAL_TABLET | Freq: Two times a day (BID) | ORAL | 0 refills | Status: DC | PRN

## 2018-01-25 NOTE — Progress Notes (Signed)
Subjective:    Patient ID: Alexandria Mclaughlin, female    DOB: 1940/04/20, 77 y.o.   MRN: [id number]  HPI   Alexandria Mclaughlin is here in follow-up of her chronic pain.  She did not experience any relief with the right peritendinous injection of the biceps tendon last month.  She is had ongoing pain in her right jaw.  She told me that her oral surgeon had mentioned tendinitis of her jaw.  She continues have significant pain in the jaw and near the ear that limits chewing and loud speaking.  It seems to radiate into the neck and perhaps even the shoulder as well.  Right shoulder itself is tender with pain radiating down the arm particularly with movements.  She remains on fentanyl patch for pain control as well as Percocet for breakthrough pain.  She states that the medicines are not covering her pain at this point.  I did give her a course of oral prednisone for pain control which provided no relief.  As far as her back is concerned she is having some pain in the leg once again.  The back itself is also tender.  Pain Inventory Average Pain 5 Pain Right Now 5 My pain is constant, sharp and stabbing  In the last 24 hours, has pain interfered with the following? General activity 5 Relation with others 5 Enjoyment of life 5 What TIME of day is your pain at its worst? daytime Sleep (in general) Good  Pain is worse with: walking and sitting Pain improves with: heat/ice and medication Relief from Meds: 3  Mobility Do you have any goals in this area?  no  Function Do you have any goals in this area?  no  Neuro/Psych No problems in this area  Prior Studies Any changes since last visit?  no  Physicians involved in your care Dr. Darel Hong   Family History  Problem Relation Age of Onset  . Diabetes Mother   . Heart disease Mother   . Stroke Mother   . Heart disease Father   . Heart disease Brother    Social History   Socioeconomic History  . Marital status: Widowed    Spouse name:  Not on file  . Number of children: Not on file  . Years of education: Not on file  . Highest education level: Not on file  Occupational History  . Not on file  Social Needs  . Financial resource strain: Not on file  . Food insecurity:    Worry: Not on file    Inability: Not on file  . Transportation needs:    Medical: Not on file    Non-medical: Not on file  Tobacco Use  . Smoking status: Former Smoker    Packs/day: 0.50    Years: 5.00    Pack years: 2.50    Types: Cigarettes    Last attempt to quit: 03/15/1968    Years since quitting: 49.8  . Smokeless tobacco: Never Used  Substance and Sexual Activity  . Alcohol use: Yes    Alcohol/week: 1.0 standard drinks    Types: 1 Glasses of wine per week    Comment: occasional glass of wine  . Drug use: No  . Sexual activity: Not on file  Lifestyle  . Physical activity:    Days per week: Not on file    Minutes per session: Not on file  . Stress: Not on file  Relationships  . Social connections:    Talks on phone:  Not on file    Gets together: Not on file    Attends religious service: Not on file    Active member of club or organization: Not on file    Attends meetings of clubs or organizations: Not on file    Relationship status: Not on file  Other Topics Concern  . Not on file  Social History Narrative  . Not on file   Past Surgical History:  Procedure Laterality Date  . RETINAL DETACHMENT SURGERY  9/13   Past Medical History:  Diagnosis Date  . Cervical facet syndrome   . Depression   . Disorders of sacrum   . Enthesopathy of hip region   . Headache   . Hypertension   . Sciatic neuritis   . Synovitis and tenosynovitis   . Vision abnormalities    BP (!) 153/89   Pulse 89   Ht 5\' 3"  (1.6 m)   Wt 141 lb (64 kg)   SpO2 95%   BMI 24.98 kg/m   Opioid Risk Score:   Fall Risk Score:  `1  Depression screen PHQ 2/9  Depression screen [facility] 2/9 01/25/2018 10/12/2017 06/03/2017 04/08/2017 10/22/2015 05/23/2015  03/20/2015  Decreased Interest 0 0 0 0 0 0 0  Down, Depressed, Hopeless 0 0 0 0 0 0 0  PHQ - 2 Score 0 0 0 0 0 0 0  Altered sleeping - - 0 - - 0 -  Tired, decreased energy - - 0 - - 3 -  Change in appetite - - 0 - - 3 -  Feeling bad or failure about yourself  - - 0 - - 0 -  Trouble concentrating - - 0 - - 0 -  Moving slowly or fidgety/restless - - 0 - - 0 -  Suicidal thoughts - - 0 - - 0 -  PHQ-9 Score - - 0 - - 6 -    Review of Systems  Constitutional: Negative.   HENT: Negative.   Eyes: Negative.   Respiratory: Negative.   Cardiovascular: Negative.   Gastrointestinal: Negative.   Endocrine: Negative.   Genitourinary: Negative.   Musculoskeletal: Negative.   Skin: Negative.   Allergic/Immunologic: Negative.   Neurological: Negative.   Hematological: Negative.   Psychiatric/Behavioral: Negative.   All other systems reviewed and are negative.      Objective:   Physical Exam General: No acute distress HEENT: EOMI, oral membranes moist Cards: reg rate  Chest: normal effort Abdomen: Soft, NT, ND Skin: dry, intact Extremities: no edema Neurological:she is alert and appropriate.strength 5/5UE, 3-4/5 RLE and 4/5 LLE. DTr's 1+ to trace in LE's. Musc: right shoulder tender with impingment maneuvers today. Pain along right trap. right TMJ and mandible also tender. Able to talk. No palpable clicking in TMJ.  Psychiatric:pleasant .         1. Post laminectomy syndrome with right buttock pain. Pt with worseningcentralstenosis on MRIwith neurogenic claudication:  -continue fentanyl patch88mcgevery 72 hours, #10. ContinuePercocet 10/325 #75. Both RF today -We will continue the controlled substance monitoring program, this consists of regular clinic visits, examinations, routine drug screening, pill counts as well as use of [place] Controlled Substance Reporting System. [place] was [place] today.   \.   -Consider lumbar radiofrequency ablation  depending on duration of relief with the current medial branch blocks.  2. Neuropathic Pain/right sided lumbar radicular pain:  -Continuegabapentin to 600mg  QID. -continue HEP as possible 3. Gait Disorder: Continue HEP /Neurology Following -Continue straight cane for balance if needed  4. Myoclonus with sleep: Continue Klonopin 5. Constipation opioid induced: scheduled senokot-s 6.  Osteoarthritis of hands.   Voltaren gel 3 times daily.   7. Right shoulder pain. No relief with biceps tendon injection.  -right shoulder injection today  -After informed consent and preparation of the skin with betadine and isopropyl alcohol, I injected 6mg  (1cc) of celestone and 4cc of 1% lidocaine into the right subacromial space via lateral approach. Additionally, aspiration was performed prior to injection. The patient tolerated well, and no complications were encountered. Afterward the area was cleaned and dressed. Post- injection instructions were provided.  Pt was experiencing some relief before leaving the office . 8. Right jaw pain related to abscessed tooth. Pt with ongoing pain since removal especially along angle of jaw.   -oral surgery follow up recommended.   -there may be some overlap with shoulder pain   74minutes of face to face patient care time was spent during this visit. All questions were encouraged and answered. Follow-up with me in about 2 months time

## 2018-01-25 NOTE — Patient Instructions (Signed)
PLEASE FEEL FREE TO CALL OUR OFFICE WITH ANY PROBLEMS OR QUESTIONS ([phone])      

## 2018-01-26 ENCOUNTER — Ambulatory Visit
Admission: RE | Admit: 2018-01-26 | Discharge: 2018-01-26 | Disposition: A | Discharge status: Home / Self Care | Payer: Medicare HMO | Source: Ambulatory Visit | Attending: Physical Medicine & Rehabilitation | Admitting: Physical Medicine & Rehabilitation

## 2018-01-26 DIAGNOSIS — M25511 Pain in right shoulder: Secondary | ICD-10-CM

## 2018-01-26 DIAGNOSIS — M19011 Primary osteoarthritis, right shoulder: Secondary | ICD-10-CM | POA: Diagnosis not present

## 2018-02-02 ENCOUNTER — Telehealth: Payer: Self-pay

## 2018-02-02 NOTE — Telephone Encounter (Signed)
Pt called stating Dr. Letta Pate order an injection for her but she never heard anything.  I called pt back and via my chart message Dr. Letta Pate ordered [place] MBB, pt has been scheduled.

## 2018-02-03 ENCOUNTER — Telehealth: Payer: Self-pay

## 2018-02-03 DIAGNOSIS — M7581 Other shoulder lesions, right shoulder: Secondary | ICD-10-CM

## 2018-02-03 NOTE — Telephone Encounter (Signed)
Accidentally forwarded to incorrect provider, forwarding it to correct one

## 2018-02-03 NOTE — Telephone Encounter (Signed)
Message from patient

## 2018-02-03 NOTE — Telephone Encounter (Signed)
Patient sent e-mail message to Dr. Naaman Plummer in reguards to recent X-rays:  After getting the results of my xray, should I put my arm in a sling?   Will forward to provider for Xray results and question

## 2018-02-03 NOTE — Telephone Encounter (Signed)
Reviewed XR findings with Mrs Branscum. Had some relief with injection. Made referral to [facility]. If no improvement will pursue MRI in January.

## 2018-02-08 ENCOUNTER — Encounter

## 2018-02-08 ENCOUNTER — Encounter: Payer: Medicare HMO | Admitting: Physical Medicine & Rehabilitation

## 2018-02-10 ENCOUNTER — Encounter

## 2018-02-10 ENCOUNTER — Encounter: Payer: Medicare HMO | Admitting: Physical Medicine & Rehabilitation

## 2018-02-15 ENCOUNTER — Ambulatory Visit: Payer: Medicare HMO | Admitting: Physical Medicine & Rehabilitation

## 2018-02-18 ENCOUNTER — Other Ambulatory Visit: Payer: Self-pay

## 2018-02-18 ENCOUNTER — Ambulatory Visit: Payer: Medicare HMO | Attending: Physical Medicine & Rehabilitation | Admitting: Physical Therapy

## 2018-02-18 DIAGNOSIS — M25511 Pain in right shoulder: Secondary | ICD-10-CM

## 2018-02-18 DIAGNOSIS — R293 Abnormal posture: Secondary | ICD-10-CM | POA: Insufficient documentation

## 2018-02-18 DIAGNOSIS — M6281 Muscle weakness (generalized): Secondary | ICD-10-CM | POA: Diagnosis not present

## 2018-02-18 NOTE — Therapy (Signed)
[facility] [address] [place], [place], [address] Phone: [phone]   Fax:  [phone]  Physical Therapy Evaluation  Patient Details  Name: Alexandria Mclaughlin MRN: [id number] Date of Birth: 1940-07-17 Referring Provider (PT): Meredith Staggers, MD   Encounter Date: 02/18/2018  PT End of Session - 02/18/18 1644    Visit Number  1    Date for PT Re-Evaluation  04/15/18    PT Start Time  8127    PT Stop Time  1608    PT Time Calculation (min)  35 min    Activity Tolerance  Patient tolerated treatment well    Behavior During Therapy  [employer] for tasks assessed/performed       Past Medical History:  Diagnosis Date  . Cervical facet syndrome   . Depression   . Disorders of sacrum   . Enthesopathy of hip region   . Headache   . Hypertension   . Sciatic neuritis   . Synovitis and tenosynovitis   . Vision abnormalities     Past Surgical History:  Procedure Laterality Date  . RETINAL DETACHMENT SURGERY  9/13    There were no vitals filed for this visit.   Subjective Assessment - 02/18/18 1537    Subjective  Pt had x-Nazir in shoulder and she reports it showed some AC joint problems.  Pt states shoulder is much better but hurts to lift and reach overhead.  Pt states she is having back pain on Rt side also    Pertinent History  lumbar surgery fusion L4-5    Limitations  Reading;Lifting    Currently in Pain?  Yes    Pain Score  3     Pain Location  Shoulder    Pain Orientation  Right    Pain Descriptors / Indicators  Sore;Tender    Pain Type  Chronic pain    Pain Onset  More than a month ago    Pain Frequency  Intermittent    Aggravating Factors   lifting and reaching    Pain Relieving Factors  resting    Multiple Pain Sites  Yes    Pain Score  5    Pain Location  Back    Pain Orientation  Right    Pain Descriptors / Indicators  Shooting    Pain Type  Chronic pain    Pain Radiating Towards  down the right leg     Pain Onset  More than a month ago    Pain Frequency  Intermittent         [facility] PT Assessment - 02/18/18 0001      Assessment   Medical Diagnosis  M75.81 (ICD-10-CM) - Right rotator cuff tendonitis    Referring Provider (PT)  Meredith Staggers, MD    Onset Date/Surgical Date  12/24/17    Hand Dominance  Right    Prior Therapy  No      Precautions   Precautions  None      Restrictions   Weight Bearing Restrictions  No      Balance Screen   Has the patient fallen in the past 6 months  No      [place] residence    Living Arrangements  Spouse/significant other   caregiver due to boyfrend has parkinsons   Available Help at Discharge  --      Prior Function   Level of Independence  Independent  Cognition   Overall Cognitive Status  Within Functional Limits for tasks assessed      Observation/Other Assessments   Focus on Therapeutic Outcomes (FOTO)   44% limited      ROM / Strength   AROM / PROM / Strength  AROM;PROM;Strength      AROM   Overall AROM Comments  Rt shoulder internal rotaiton just to glutes on right side    AROM Assessment Site  Shoulder    Right/Left Shoulder  Right;Left    Right Shoulder Flexion  100 Degrees    Right Shoulder ABduction  120 Degrees    Right Shoulder Horizontal ABduction  138 Degrees    Right Shoulder Horizontal  ADduction  134 Degrees      PROM   Overall PROM Comments  Rt shoulder IR 20% limited      Strength   Overall Strength Comments  left shoulder 4+/5 througout    Strength Assessment Site  Shoulder    Right/Left Shoulder  Right    Right Shoulder Flexion  3/5    Right Shoulder Extension  5/5    Right Shoulder Internal Rotation  5/5    Right Shoulder External Rotation  4/5    Right Shoulder Horizontal ABduction  3+/5    Right Shoulder Horizontal ADduction  3/5      Palpation   Palpation comment  anterior right corocoid process tender, Rt pecs, upper traps, ant delt all tender  to palpation and tight, T9 IR Lt, just to glute on Rt      Special Tests    Special Tests  Rotator Cuff Impingement    Rotator Cuff Impingment tests  Neer impingement test;Hawkins- Kennedy test      Neer Impingement test    Findings  Negative      Hawkins-Kennedy test   Findings  Negative      Ambulation/Gait   Gait Pattern  Within Functional Limits                Objective measurements completed on examination: See above findings.              PT Education - 02/18/18 1659    Education Details  [id number]    Person(s) Educated  Patient    Methods  Explanation;Demonstration;Handout;Verbal cues    Comprehension  Verbalized understanding;Returned demonstration       PT Short Term Goals - 02/18/18 1704      PT SHORT TERM GOAL #1   Title  ind with HEP    Time  4    Period  Weeks    Status  New    Target Date  03/18/18        PT Long Term Goals - 02/18/18 1659      PT LONG TERM GOAL #1   Title  FOTO < or = to 36% limited    Time  8    Period  Weeks    Status  New    Target Date  04/15/18      PT LONG TERM GOAL #2   Title  Pt will be able to demonstrate AROM Rt shoulder flexion and abduction of 130 or more for ability to do overhead tasks    Time  8    Period  Weeks    Status  New    Target Date  04/15/18      PT LONG TERM GOAL #3   Title  Pt will demonstrate MMT Rt shoulder horizontal adduction and flexion  of at least 4/5 for functional reaching and lifting.    Time  8    Period  Weeks    Status  New    Target Date  04/15/18      PT LONG TERM GOAL #4   Title  Pt will report 80% less shoulder pain during typical daily activities.    Time  8    Period  Weeks    Status  New    Target Date  04/15/18             Plan - 02/18/18 1647    Clinical Impression Statement  Pt presents to skilled PT due to shoulder pain that she has experienced for the past 2 months.  Pt demonstrates posture abnormalities of rounded shoulders.  She has  weakness of UE Rt>Lt and she is right handed.  Pt has decreased AROM but PROM is normal.  Pt has no adhesions in the glenohumeral joint.  She has tender and tight upper traps, pecs, and anterior deltoid Rt UE.  She has decreased active and passive right shoulder internal rotation.  Pt will benefit from skilled PT to address impairments so she can return to functional activities such as taking care of her significant other who has Parkinsons.    History and Personal Factors relevant to plan of care:  chronic pain, stressful living situation    Clinical Presentation  Stable    Clinical Presentation due to:  pt is stable    Clinical Decision Making  Low    Rehab Potential  Excellent    PT Frequency  1x / week    PT Duration  8 weeks    PT Treatment/Interventions  ADLs/Self Care Home Management;Biofeedback;Cryotherapy;Electrical Stimulation;Iontophoresis 4mg /ml Dexamethasone;Moist Heat;Ultrasound;Therapeutic activities;Therapeutic exercise;Neuromuscular re-education;Patient/family education;Manual techniques;Dry needling;Passive range of motion;Taping    PT Next Visit Plan  scapular and RTC strengthening, posture, discuss ionto    PT Home Exercise Plan  [id number]    Recommended Other Services  eval 02/18/18    Consulted and Agree with Plan of Care  Patient       Patient will benefit from skilled therapeutic intervention in order to improve the following deficits and impairments:  Pain, Postural dysfunction, Increased fascial restricitons, Impaired flexibility, Decreased strength, Decreased range of motion, Impaired UE functional use  Visit Diagnosis: Right shoulder pain, unspecified chronicity  Muscle weakness (generalized)  Abnormal posture     Problem List Patient Active Problem List   Diagnosis Date Noted  . Lumbar radicular pain 07/29/2016  . Myoclonus 09/19/2015  . Therapeutic opioid induced constipation 07/25/2015  . White matter changes 02/07/2015  . Essential hypertension  02/07/2015  . HLD (hyperlipidemia) 02/07/2015  . Low back pain 02/07/2015  . BPPV (benign paroxysmal positional vertigo) 02/07/2015  . Abnormality of gait 02/07/2015  . Lumbar facet arthropathy 07/05/2014  . Acquired unequal leg length on left 08/08/2011  . Lumbar post-laminectomy syndrome 06/11/2011  . Sacroiliac joint dysfunction 06/11/2011    Zannie Cove, PT 02/18/2018, 5:11 PM  [facility] [address] [place], [place], [address] Phone: [phone]   Fax:  [phone]  Name: BREN BORYS MRN: [id number] Date of Birth: [birth year]

## 2018-02-18 NOTE — Patient Instructions (Signed)
Access code [facility]

## 2018-02-22 ENCOUNTER — Encounter: Payer: Self-pay | Admitting: Physical Medicine & Rehabilitation

## 2018-02-22 ENCOUNTER — Ambulatory Visit: Payer: Medicare HMO | Admitting: Physical Medicine & Rehabilitation

## 2018-02-22 ENCOUNTER — Encounter (HOSPITAL_BASED_OUTPATIENT_CLINIC_OR_DEPARTMENT_OTHER): Payer: Medicare HMO | Admitting: Physical Medicine & Rehabilitation

## 2018-02-22 ENCOUNTER — Encounter

## 2018-02-22 DIAGNOSIS — M47816 Spondylosis without myelopathy or radiculopathy, lumbar region: Secondary | ICD-10-CM | POA: Diagnosis not present

## 2018-02-22 DIAGNOSIS — M961 Postlaminectomy syndrome, not elsewhere classified: Secondary | ICD-10-CM

## 2018-02-22 DIAGNOSIS — M533 Sacrococcygeal disorders, not elsewhere classified: Secondary | ICD-10-CM

## 2018-02-22 DIAGNOSIS — M5416 Radiculopathy, lumbar region: Secondary | ICD-10-CM

## 2018-02-22 DIAGNOSIS — M2170 Unequal limb length (acquired), unspecified site: Secondary | ICD-10-CM | POA: Diagnosis not present

## 2018-02-22 MED ORDER — OXYCODONE-ACETAMINOPHEN 10-325 MG PO TABS: 1.0000 | ORAL_TABLET | Freq: Two times a day (BID) | ORAL | 0 refills | Status: DC | PRN

## 2018-02-22 MED ORDER — NORTRIPTYLINE HCL 10 MG PO CAPS: 10.0000 mg | ORAL_CAPSULE | Freq: Every day | ORAL | 2 refills | Status: DC

## 2018-02-22 MED ORDER — FENTANYL 25 MCG/HR TD PT72: 25.0000 ug | MEDICATED_PATCH | TRANSDERMAL | 0 refills | Status: DC

## 2018-02-22 NOTE — Patient Instructions (Signed)
PLEASE FEEL FREE TO CALL OUR OFFICE WITH ANY PROBLEMS OR QUESTIONS ([phone])      

## 2018-02-22 NOTE — Progress Notes (Signed)
Subjective:    Patient ID: Alexandria Mclaughlin, female    DOB: 02/06/1941, 77 y.o.   MRN: [id number]  HPI   [name] is here in follow-up of her chronic pain.  She continues in physical therapy for her right shoulder which has provided some benefit.  She still is having discomfort at this level.  She is having ongoing back pain and is awaiting her lumbar radiofrequency ablations.  She also notes increased pain in the right leg and that her right leg gives out at times.  Her most recent MRI is from January and results are as follows:  1. Chronic decompression and fusion at L5-S1 with solid arthrodesis. 2. Severe adjacent segment disease at L4-L5 with progression since the 2010 MRI, including new mild grade 1 anterolisthesis. Severe multifactorial spinal, and left greater than right lateral recess stenosis. Increased, up to moderate bilateral L4 neural foraminal stenosis. Associated acute degenerative endplate marrow edema. 3. Mild progression of L2-L3 and L3-L4 degeneration since 2010. There is associated mild left L3 neural foramina and mild to moderate left L4 level lateral recess stenosis.  She is interested in seeking a second opinion regarding her low back and would like to see a neurosurgeon.  For pain she remains on fentanyl and Percocet as prescribed.  She states that the Percocet is not covering her pain.  She is also on gabapentin 600 mg 4 times daily.  Pain seems to keep her sedentary at this point.  She is able to sleep with her pain medication and trazodone.  Pain Inventory Average Pain 4 Pain Right Now 4 My pain is intermittent, sharp and stabbing  In the last 24 hours, has pain interfered with the following? General activity 4 Relation with others 5 Enjoyment of life 6 What TIME of day is your pain at its worst? morning Sleep (in general) Good  Pain is worse with: walking, sitting and standing Pain improves with: rest, heat/ice and medication Relief from Meds:  7  Mobility walk without assistance Do you have any goals in this area?  no  Function Do you have any goals in this area?  no  Neuro/Psych bladder control problems  Prior Studies Any changes since last visit?  no  Physicians involved in your care Any changes since last visit?  no   Family History  Problem Relation Age of Onset  . Diabetes Mother   . Heart disease Mother   . Stroke Mother   . Heart disease Father   . Heart disease Brother    Social History   Socioeconomic History  . Marital status: Widowed    Spouse name: Not on file  . Number of children: Not on file  . Years of education: Not on file  . Highest education level: Not on file  Occupational History  . Not on file  Social Needs  . Financial resource strain: Not on file  . Food insecurity:    Worry: Not on file    Inability: Not on file  . Transportation needs:    Medical: Not on file    Non-medical: Not on file  Tobacco Use  . Smoking status: Former Smoker    Packs/day: 0.50    Years: 5.00    Pack years: 2.50    Types: Cigarettes    Last attempt to quit: 03/15/1968    Years since quitting: 49.9  . Smokeless tobacco: Never Used  Substance and Sexual Activity  . Alcohol use: Yes    Alcohol/week: 1.0 standard  drinks    Types: 1 Glasses of wine per week    Comment: occasional glass of wine  . Drug use: No  . Sexual activity: Not on file  Lifestyle  . Physical activity:    Days per week: Not on file    Minutes per session: Not on file  . Stress: Not on file  Relationships  . Social connections:    Talks on phone: Not on file    Gets together: Not on file    Attends religious service: Not on file    Active member of club or organization: Not on file    Attends meetings of clubs or organizations: Not on file    Relationship status: Not on file  Other Topics Concern  . Not on file  Social History Narrative  . Not on file   Past Surgical History:  Procedure Laterality Date  . RETINAL  DETACHMENT SURGERY  9/13   Past Medical History:  Diagnosis Date  . Cervical facet syndrome   . Depression   . Disorders of sacrum   . Enthesopathy of hip region   . Headache   . Hypertension   . Sciatic neuritis   . Synovitis and tenosynovitis   . Vision abnormalities    BP 133/85   Pulse 96   Resp 14   Ht 5\' 3"  (1.6 m)   Wt 137 lb (62.1 kg)   SpO2 92%   BMI 24.27 kg/m   Opioid Risk Score:   Fall Risk Score:  `1  Depression screen PHQ 2/9  Depression screen [facility] 2/9 01/25/2018 10/12/2017 06/03/2017 04/08/2017 10/22/2015 05/23/2015 03/20/2015  Decreased Interest 0 0 0 0 0 0 0  Down, Depressed, Hopeless 0 0 0 0 0 0 0  PHQ - 2 Score 0 0 0 0 0 0 0  Altered sleeping - - 0 - - 0 -  Tired, decreased energy - - 0 - - 3 -  Change in appetite - - 0 - - 3 -  Feeling bad or failure about yourself  - - 0 - - 0 -  Trouble concentrating - - 0 - - 0 -  Moving slowly or fidgety/restless - - 0 - - 0 -  Suicidal thoughts - - 0 - - 0 -  PHQ-9 Score - - 0 - - 6 -  Some recent data might be hidden    Review of Systems  Constitutional: Negative.   HENT: Negative.   Eyes: Negative.   Respiratory: Negative.   Cardiovascular: Negative.   Gastrointestinal: Negative.   Endocrine: Negative.   Genitourinary: Negative.   Musculoskeletal: Positive for arthralgias, back pain and gait problem.  Allergic/Immunologic: Negative.   Hematological: Negative.   Psychiatric/Behavioral: Negative.   All other systems reviewed and are negative.      Objective:   Physical Exam  General: No acute distress HEENT: EOMI, oral membranes moist Cards: reg rate  Chest: normal effort Abdomen: Soft, NT, ND Skin: dry, intact Extremities: no edema  Neurological:she is alert and appropriate.strength 5/5UE, 3-4/5 RLE and 4/5 LLE. DTr's 1+ to trace in LE's. Musc:right shoulder less tender with impingment maneuvers today.  With improved range of motion.  Right knee nontender with provocative maneuvers.   Straight leg raising on the right revoked hamstring tightness. Psychiatric:pleasant .         1. Post laminectomy syndrome with right buttock pain. Pt with worseningcentralstenosis on MRIwith neurogenic claudication:  -continue fentanyl patch64mcgevery 72 hours, #10. ContinuePercocet 10/325 #75. Both RF  today for January -We will continue the controlled substance monitoring program, this consists of regular clinic visits, examinations, routine drug screening, pill counts as well as use of [place] Controlled Substance Reporting System. [place].    -Patient requesting referral to neurosurgery for second opinion regarding her back.  Made referral to Dr. Erline [facility] neurosurgery..  2. Neuropathic Pain/right sided lumbar radicular pain, L4?:  -Continuegabapentin to 600mg  QID. -continue HEP as possible, might benefit from formal physical therapy   -Consider epidural injection at this level also. 3. Gait Disorder: Continue HEP /Neurology Following -Continue straight cane for balance if needed  4. Myoclonus with sleep: Continue Klonopin 5. Lumbar facet arthropathy: good results with  Recent MBB's at L3-4/ L4-5 with greater than 50% relief. Referred to Dr. Letta Pate for radiofrequency ablations at these levels 6.Osteoarthritis of hands.  Voltaren gel 3 times daily.  7. Right shoulder pain. .      -some relief with subacromial injection injection  -continue therapy 8. Right jaw pain related to abscessed tooth. Pt with ongoing pain since removal especially along angle of jaw.       -oral surgery follow up ongoing  26minutes of face to face patient care time was spent during this visit. All questions were encouraged and answered. Follow-up with me in about 2 months time

## 2018-02-23 ENCOUNTER — Ambulatory Visit: Payer: Medicare HMO | Admitting: Physical Therapy

## 2018-02-23 ENCOUNTER — Ambulatory Visit: Payer: Medicare HMO

## 2018-02-23 ENCOUNTER — Encounter: Payer: Self-pay | Admitting: Physical Therapy

## 2018-02-23 DIAGNOSIS — M6281 Muscle weakness (generalized): Secondary | ICD-10-CM | POA: Diagnosis not present

## 2018-02-23 DIAGNOSIS — R293 Abnormal posture: Secondary | ICD-10-CM | POA: Diagnosis not present

## 2018-02-23 DIAGNOSIS — M25511 Pain in right shoulder: Secondary | ICD-10-CM | POA: Diagnosis not present

## 2018-02-23 NOTE — Therapy (Addendum)
[facility] [address], [place], [place], [address] Phone: [phone]   Fax:  [phone]  Physical Therapy Treatment  Patient Details  Name: Alexandria Mclaughlin MRN: [id number] Date of Birth: Jun 04, 1940 Referring Provider (PT): Meredith Staggers, MD   Encounter Date: 02/23/2018  PT End of Session - 02/23/18 1228    Visit Number  2    Date for PT Re-Evaluation  04/15/18    PT Start Time  1225    PT Stop Time  1308    PT Time Calculation (min)  43 min    Activity Tolerance  Patient tolerated treatment well    Behavior During Therapy  [facility] for tasks assessed/performed       Past Medical History:  Diagnosis Date  . Cervical facet syndrome   . Depression   . Disorders of sacrum   . Enthesopathy of hip region   . Headache   . Hypertension   . Sciatic neuritis   . Synovitis and tenosynovitis   . Vision abnormalities     Past Surgical History:  Procedure Laterality Date  . RETINAL DETACHMENT SURGERY  9/13    There were no vitals filed for this visit.  Subjective Assessment - 02/23/18 1228    Subjective  Stretches are feeling good.    Limitations  Reading;Lifting    Currently in Pain?  Yes    Pain Score  3     Pain Location  Shoulder    Pain Orientation  Right    Pain Descriptors / Indicators  Sore;Tender    Pain Type  Chronic pain    Pain Onset  More than a month ago    Pain Frequency  Intermittent    Aggravating Factors   lifting and reaching    Pain Relieving Factors  resting    Multiple Pain Sites  No                       [facility] Adult PT Treatment/Exercise - 02/23/18 0001      Exercises   Exercises  Shoulder      Shoulder Exercises: Seated   Other Seated Exercises  scap squeezes, UT stretches       Shoulder Exercises: Standing   External Rotation  Strengthening;Right;20 reps;Theraband    Theraband Level (Shoulder External Rotation)  Level 1 (Yellow)    Internal Rotation   Strengthening;Right;20 reps;Theraband    Theraband Level (Shoulder Internal Rotation)  Level 1 (Yellow)    Flexion  Strengthening;Both;15 reps;Weights    Shoulder Flexion Weight (lbs)  1    Extension  Strengthening;Right;Left;20 reps;Theraband    Theraband Level (Shoulder Extension)  Level 1 (Yellow)    Row  Strengthening;20 reps;Both;Theraband    Theraband Level (Shoulder Row)  Level 2 (Red)    Retraction  AROM;Both;20 reps    Other Standing Exercises  finger ladder to L16 - cues to engage scap retraction and depression - 5x    Other Standing Exercises  wall slide with towel for flexion AAROM      Shoulder Exercises: Pulleys   Flexion  3 minutes    Scaption  3 minutes               PT Short Term Goals - 02/23/18 1317      PT SHORT TERM GOAL #1   Title  ind with HEP    Status  Achieved        PT Long Term Goals -  02/18/18 1659      PT LONG TERM GOAL #1   Title  FOTO < or = to 36% limited    Time  8    Period  Weeks    Status  New    Target Date  04/15/18      PT LONG TERM GOAL #2   Title  Pt will be able to demonstrate AROM Rt shoulder flexion and abduction of 130 or more for ability to do overhead tasks    Time  8    Period  Weeks    Status  New    Target Date  04/15/18      PT LONG TERM GOAL #3   Title  Pt will demonstrate MMT Rt shoulder horizontal adduction and flexion of at least 4/5 for functional reaching and lifting.    Time  8    Period  Weeks    Status  New    Target Date  04/15/18      PT LONG TERM GOAL #4   Title  Pt will report 80% less shoulder pain during typical daily activities.    Time  8    Period  Weeks    Status  New    Target Date  04/15/18            Plan - 02/23/18 1315    Clinical Impression Statement  Pt met [facility] for initial HEP.  Pt was able to progress strength and added to HEP. Pt needs cues to keep shoulders down and back throughout.  She demonstrates difficulty with scapular stability and needing a lot of cueing  verbal and tactile throughout.  Pt will benefit from skilled PT    PT Treatment/Interventions  ADLs/Self Care Home Management;Biofeedback;Cryotherapy;Electrical Stimulation;Iontophoresis 26m/ml Dexamethasone;Moist Heat;Ultrasound;Therapeutic activities;Therapeutic exercise;Neuromuscular re-education;Patient/family education;Manual techniques;Dry needling;Passive range of motion;Taping    PT Next Visit Plan  scapular and RTC strengthening, posture, discuss ionto    PT [facility] Plan  [facility]   Recommended Other Services  eval 12/26; order signed ionto okay    Consulted and Agree with Plan of Care  Patient       Patient will benefit from skilled therapeutic intervention in order to improve the following deficits and impairments:  Pain, Postural dysfunction, Increased fascial restricitons, Impaired flexibility, Decreased strength, Decreased range of motion, Impaired UE functional use  Visit Diagnosis: Right shoulder pain, unspecified chronicity  Muscle weakness (generalized)  Abnormal posture     Problem List Patient Active Problem List   Diagnosis Date Noted  . Lumbar radicular pain 07/29/2016  . Myoclonus 09/19/2015  . Therapeutic opioid induced constipation 07/25/2015  . White matter changes 02/07/2015  . Essential hypertension 02/07/2015  . HLD (hyperlipidemia) 02/07/2015  . Low back pain 02/07/2015  . BPPV (benign paroxysmal positional vertigo) 02/07/2015  . Abnormality of gait 02/07/2015  . Lumbar facet arthropathy 07/05/2014  . Acquired unequal leg length on left 08/08/2011  . Lumbar post-laminectomy syndrome 06/11/2011  . Sacroiliac joint dysfunction 06/11/2011    JZannie Cove PT 02/23/2018, 1:18 PM  [facility] [address] [place] [address]: [phone]  Fax:  [phone] Name: MKAMARIA LUCIAMRN: [id number] of Birth: 1January 04, 1942 PHYSICAL THERAPY DISCHARGE  SUMMARY  Visits from Start of Care: 2  Current functional level related to goals / functional outcomes: See above   Remaining deficits: See above   Education / Equipment: HEP  Plan: Patient agrees to discharge.  Patient goals were not met. Patient is being discharged due to not returning since the last visit.  ?????   Called office to report she was feeling better and was put on hold until end of POC, so she is now being d/c'd   [employer], PT 04/20/18 8:27 AM

## 2018-03-01 ENCOUNTER — Encounter (HOSPITAL_COMMUNITY): Payer: Self-pay | Admitting: Emergency Medicine

## 2018-03-01 ENCOUNTER — Emergency Department (HOSPITAL_COMMUNITY): Payer: Medicare HMO

## 2018-03-01 ENCOUNTER — Telehealth: Payer: Self-pay | Admitting: *Deleted

## 2018-03-01 ENCOUNTER — Emergency Department (HOSPITAL_COMMUNITY)
Admission: EM | Admit: 2018-03-01 | Discharge: 2018-03-01 | Disposition: A | Discharge status: Home / Self Care | Payer: Medicare HMO | Attending: Emergency Medicine | Admitting: Emergency Medicine

## 2018-03-01 DIAGNOSIS — Y929 Unspecified place or not applicable: Secondary | ICD-10-CM | POA: Diagnosis not present

## 2018-03-01 DIAGNOSIS — M533 Sacrococcygeal disorders, not elsewhere classified: Secondary | ICD-10-CM

## 2018-03-01 DIAGNOSIS — S32010A Wedge compression fracture of first lumbar vertebra, initial encounter for closed fracture: Secondary | ICD-10-CM | POA: Diagnosis not present

## 2018-03-01 DIAGNOSIS — Y939 Activity, unspecified: Secondary | ICD-10-CM | POA: Diagnosis not present

## 2018-03-01 DIAGNOSIS — M545 Low back pain: Secondary | ICD-10-CM | POA: Diagnosis not present

## 2018-03-01 DIAGNOSIS — M5489 Other dorsalgia: Secondary | ICD-10-CM | POA: Diagnosis not present

## 2018-03-01 DIAGNOSIS — S3992XA Unspecified injury of lower back, initial encounter: Secondary | ICD-10-CM | POA: Diagnosis not present

## 2018-03-01 DIAGNOSIS — M961 Postlaminectomy syndrome, not elsewhere classified: Secondary | ICD-10-CM

## 2018-03-01 DIAGNOSIS — I959 Hypotension, unspecified: Secondary | ICD-10-CM | POA: Diagnosis not present

## 2018-03-01 DIAGNOSIS — Y999 Unspecified external cause status: Secondary | ICD-10-CM | POA: Insufficient documentation

## 2018-03-01 DIAGNOSIS — W19XXXA Unspecified fall, initial encounter: Secondary | ICD-10-CM | POA: Diagnosis not present

## 2018-03-01 DIAGNOSIS — I10 Essential (primary) hypertension: Secondary | ICD-10-CM | POA: Diagnosis not present

## 2018-03-01 DIAGNOSIS — R0902 Hypoxemia: Secondary | ICD-10-CM | POA: Diagnosis not present

## 2018-03-01 DIAGNOSIS — X58XXXA Exposure to other specified factors, initial encounter: Secondary | ICD-10-CM | POA: Diagnosis not present

## 2018-03-01 DIAGNOSIS — M47816 Spondylosis without myelopathy or radiculopathy, lumbar region: Secondary | ICD-10-CM

## 2018-03-01 DIAGNOSIS — Z87891 Personal history of nicotine dependence: Secondary | ICD-10-CM | POA: Insufficient documentation

## 2018-03-01 DIAGNOSIS — Z79899 Other long term (current) drug therapy: Secondary | ICD-10-CM | POA: Diagnosis not present

## 2018-03-01 MED ORDER — FENTANYL CITRATE (PF) 100 MCG/2ML IJ SOLN
50.0000 ug | Freq: Once | INTRAMUSCULAR | Status: AC
  Administered 2018-03-01: 50 ug via INTRAVENOUS
  Filled 2018-03-01: qty 2

## 2018-03-01 MED ORDER — OXYCODONE-ACETAMINOPHEN 5-325 MG PO TABS
2.0000 | ORAL_TABLET | Freq: Once | ORAL | Status: AC
  Administered 2018-03-01: 2 via ORAL
  Filled 2018-03-01: qty 2

## 2018-03-01 NOTE — ED Notes (Signed)
Ortho tech paged to apply TLSO brace

## 2018-03-01 NOTE — ED Notes (Signed)
Pt ambulated halls independently with standby assist with EMT. Tolerated well, no rest breaks needed, does continue to c/o some back pain, however maintained steady gait and pace.

## 2018-03-01 NOTE — Telephone Encounter (Signed)
Alexandria Mclaughlin called to let [name] know she had a fall and is in the ED.

## 2018-03-01 NOTE — Discharge Instructions (Signed)
Please continue your pain medicines as needed Call your pain management doctor and Dr. Vertell Limber for follow up Wear your back brace while out of bed You can also try a heating pad on the area or lidocaine patches which are over the counter Return if you are worsening

## 2018-03-01 NOTE — ED Triage Notes (Signed)
Pt here via [facility], has chronic back issues, reports she tripped on Saturday and landed on her back, denies LOC or hitting head.  Pain has been increasing since, wears a 25 mg Fentanyl patch, took a percocet at 1000 that has not helped.  A&O x4.  Pt having difficulty walking.

## 2018-03-01 NOTE — ED Provider Notes (Signed)
[facility] EMERGENCY DEPARTMENT Provider Note   CSN: [id number] Arrival date & time: 03/01/18  1541     History   Chief Complaint Chief Complaint  Patient presents with  . Fall  . Back Pain    HPI Alexandria Mclaughlin is a 78 y.o. female who presents with back pain. PMH significant for chronic back pain with prior lumbar back surgeries in [place], HTN. She goes to Dr. Tessa Lerner for pain management and uses a fentanyl patch daily along with scheduled percocet. She has been referred to Dr. Vertell Limber to see if will require additional back surgeries. She states that 2 days ago she slipped on some leaves and fell directly on to her back. She denies hitting her head or LOC. She was able to get up and was sore afterwards. Since then her back pain has been progressively worsening. It is over her lower back and worse on the right side where her chronic pain is. She has sciatica but that is not bothering her currently. She denies numbness/tingling, saddle anesthesia, bowel/bladder incontinence. She tried to get up this morning and was unable to bear weight so decided to come to the ED.   HPI  Past Medical History:  Diagnosis Date  . Cervical facet syndrome   . Depression   . Disorders of sacrum   . Enthesopathy of hip region   . Headache   . Hypertension   . Sciatic neuritis   . Synovitis and tenosynovitis   . Vision abnormalities     Patient Active Problem List   Diagnosis Date Noted  . Lumbar radicular pain 07/29/2016  . Myoclonus 09/19/2015  . Therapeutic opioid induced constipation 07/25/2015  . White matter changes 02/07/2015  . Essential hypertension 02/07/2015  . HLD (hyperlipidemia) 02/07/2015  . Low back pain 02/07/2015  . BPPV (benign paroxysmal positional vertigo) 02/07/2015  . Abnormality of gait 02/07/2015  . Lumbar facet arthropathy 07/05/2014  . Acquired unequal leg length on left 08/08/2011  . Lumbar post-laminectomy syndrome 06/11/2011  . Sacroiliac joint  dysfunction 06/11/2011    Past Surgical History:  Procedure Laterality Date  . BACK SURGERY     2011  . RETINAL DETACHMENT SURGERY  9/13     OB History   No obstetric history on file.      Home Medications    Prior to Admission medications   Medication Sig Start Date End Date Taking? Authorizing Provider  clonazePAM (KLONOPIN) 0.5 MG tablet TAKE 1 TABLET BY MOUTH EVERYDAY AT BEDTIME 01/25/18   Meredith Staggers, MD  diclofenac sodium (VOLTAREN) 1 % GEL Apply 1 application topically 3 (three) times daily. BILATERAL HANDS 12/09/17   Meredith Staggers, MD  DULoxetine (CYMBALTA) 30 MG capsule TAKE 1 CAPSULE BY MOUTH ONCE DAILY 12/21/17   Meredith Staggers, MD  fentaNYL (DURAGESIC - DOSED MCG/HR) 25 MCG/HR patch Place 1 patch (25 mcg total) onto the skin every 3 (three) days. 02/22/18   Meredith Staggers, MD  gabapentin (NEURONTIN) 600 MG tablet TAKE 1 TABLET (600 MG TOTAL) BY MOUTH 4 (FOUR) TIMES DAILY. 12/08/17   Meredith Staggers, MD  lovastatin (MEVACOR) 40 MG tablet Take 40 mg by mouth daily.    [provider]  nortriptyline (PAMELOR) 10 MG capsule Take 1-2 capsules (10-20 mg total) by mouth at bedtime. 02/22/18   Meredith Staggers, MD  OVER THE COUNTER MEDICATION Take 1 capsule by mouth daily. HEMP EXTRACT CAPSULE     [provider]  oxyCODONE-acetaminophen (  PERCOCET) 10-325 MG tablet Take 1 tablet by mouth 2 (two) times daily as needed. 02/22/18   Meredith Staggers, MD  quinapril (ACCUPRIL) 20 MG tablet Take 20 mg by mouth daily.    [provider]  traZODone (DESYREL) 100 MG tablet TAKE 1 TABLET BY MOUTH EVERYDAY AT BEDTIME 08/13/17   Meredith Staggers, MD    Family History Family History  Problem Relation Age of Onset  . Diabetes Mother   . Heart disease Mother   . Stroke Mother   . Heart disease Father   . Heart disease Brother     Social History Social History   Tobacco Use  . Smoking status: Former Smoker    Packs/day: 0.50    Years:  5.00    Pack years: 2.50    Types: Cigarettes    Last attempt to quit: 03/15/1968    Years since quitting: 49.9  . Smokeless tobacco: Never Used  Substance Use Topics  . Alcohol use: Yes    Alcohol/week: 1.0 standard drinks    Types: 1 Glasses of wine per week    Comment: occasional glass of wine  . Drug use: No     Allergies   Patient has no known allergies.   Review of Systems Review of Systems  Constitutional: Negative for fever.  Musculoskeletal: Positive for back pain, gait problem and myalgias.  Neurological: Negative for dizziness, weakness and numbness.  All other systems reviewed and are negative.    Physical Exam Updated Vital Signs BP (!) 169/84   Pulse 75   Temp 98.3 F (36.8 C) (Oral)   Resp 16   Ht 5\' 3"  (1.6 m)   Wt 62.1 kg   SpO2 97%   BMI 24.27 kg/m   Physical Exam Vitals signs and nursing note reviewed.  Constitutional:      General: She is not in acute distress.    Appearance: Normal appearance. She is well-developed.     Comments: Calm and cooperative. In pain  HENT:     Head: Normocephalic and atraumatic.  Eyes:     General: No scleral icterus.       Right eye: No discharge.        Left eye: No discharge.     Conjunctiva/sclera: Conjunctivae normal.     Pupils: Pupils are equal, round, and reactive to light.  Neck:     Musculoskeletal: Normal range of motion.  Cardiovascular:     Rate and Rhythm: Normal rate and regular rhythm.  Pulmonary:     Effort: Pulmonary effort is normal. No respiratory distress.     Breath sounds: Normal breath sounds.  Abdominal:     General: Abdomen is flat. There is no distension.     Palpations: Abdomen is soft.     Tenderness: There is no abdominal tenderness.  Musculoskeletal:     Comments: Back: Inspection: No masses, deformity, or rash. Prior surgical scar noted. Palpation: Diffuse lumbar midline tenderness as well as sacral tenderness. More tenderness over right low back than left.  Strength:  5/5 in left lower extremity and normal plantar and dorsiflexion. 4/5 in right lower extremity due to pain. Sensation: Intact sensation with light touch in lower extremities bilaterally SLR: Negative seated straight leg raise Gait: Not tested   Skin:    General: Skin is warm and dry.  Neurological:     Mental Status: She is alert and oriented to person, place, and time.  Psychiatric:        Behavior:  Behavior normal.      ED Treatments / Results  Labs (all labs ordered are listed, but only abnormal results are displayed) Labs Reviewed - No data to display  EKG None  Radiology Dg Lumbar Spine Complete  Result Date: 03/01/2018 CLINICAL DATA:  Lower back and right leg pain after fall yesterday. EXAM: LUMBAR SPINE - COMPLETE 4+ VIEW COMPARISON:  Radiographs of March 09, 2017. MRI of March 19, 2017. FINDINGS: New mild wedge compression deformity of L1 vertebral body is noted concerning for acute fracture. Mild degenerative disc disease is noted at L4-5. There appears to be fusion of L5-S1. No spondylolisthesis is noted. Atherosclerosis of abdominal aorta is noted. IMPRESSION: New mild wedge compression deformity of L1 vertebral body is noted concerning for possible acute fracture. MRI may be performed further evaluation. Aortic Atherosclerosis (ICD10-I70.0). Electronically Signed   By: Marijo Conception, M.D.   On: 03/01/2018 17:18    Procedures Procedures (including critical care time)  Medications Ordered in ED Medications  fentaNYL (SUBLIMAZE) injection 50 mcg (50 mcg Intravenous Given 03/01/18 1641)     Initial Impression / Assessment and Plan / ED Course  I have reviewed the triage vital signs and the nursing notes.  Pertinent labs & imaging results that were available during my care of the patient were reviewed by me and considered in my medical decision making (see chart for details).  78 year old female presents with low back pain after a fall 2 days ago. She has had  difficulty walking today. She is hypertensive but otherwise vitals are normal. On exam she is diffusely tender over the lumbar spine. Will order Fentanyl, Lumbar plain films.  Xray shows new L1 wedge fracture. Discussed with patient. Pain is greatly improved with IV Fentanyl. TSLO brace ordered. She was given a dose of her home meds and ambulated in the hall and did well. She was advised to f/u with her pain management doctor and Dr. Vertell Limber for f/u. Return precautions given.  Final Clinical Impressions(s) / ED Diagnoses   Final diagnoses:  Closed wedge compression fracture of first lumbar vertebra, initial encounter [facility])    ED Discharge Orders    None       Jewelz, Ricklefs, PA-C 03/01/18 2054    Charlesetta Shanks, MD 03/03/18 [phone]

## 2018-03-02 ENCOUNTER — Encounter: Payer: Medicare HMO | Admitting: Physical Therapy

## 2018-03-02 MED ORDER — OXYCODONE-ACETAMINOPHEN 10-325 MG PO TABS: 1.0000 | ORAL_TABLET | Freq: Three times a day (TID) | ORAL | 0 refills | Status: DC | PRN

## 2018-03-02 NOTE — Telephone Encounter (Signed)
I just went ahead and refilled now to reduce anxiety. Do you know if she received the "My Chart" communication from me?

## 2018-03-02 NOTE — Addendum Note (Signed)
Addended by: Alger Simons T on: 03/02/2018 07:03 PM   Modules accepted: Orders

## 2018-03-02 NOTE — Telephone Encounter (Signed)
Alexandria Mclaughlin has called back and needs [name] to call her.  She needs to discuss her meds.  She fell and has "fractured a disc".  [name] please call [phone].

## 2018-03-02 NOTE — Telephone Encounter (Signed)
I spoke with Alexandria Mclaughlin and told her that the directions were in her email from [facility].  Her oxycodone has been increased to 3x day. She says she will run out of her medication if she takes it tid.  I instructed her to call when she is within 3 days of being out.

## 2018-03-03 ENCOUNTER — Encounter: Payer: Medicare HMO | Admitting: Physical Medicine & Rehabilitation

## 2018-03-03 ENCOUNTER — Telehealth: Payer: Self-pay

## 2018-03-03 NOTE — Telephone Encounter (Signed)
Pt is calling requesting results of her xray she had done.

## 2018-03-03 NOTE — Telephone Encounter (Signed)
Yes she did.  I let her know.

## 2018-03-03 NOTE — Telephone Encounter (Signed)
There are no fractures or deformities near rotator cuff area. Xray does demonstrate arthritis in Acromio-clavicular joint (Joint where clavicle meets shoulder blade). Would continue with PT when she's able to tolerate after her back injury

## 2018-03-04 NOTE — Telephone Encounter (Signed)
Pt has been notified of xray results. Left message on VM.

## 2018-03-04 NOTE — Telephone Encounter (Signed)
Pt called back after message left and she is wanting the results of the Lumbar spine xray taken in the ED on 03-01-2018 be interpreted. She is most concerned about the Atherosclerosis.

## 2018-03-05 ENCOUNTER — Telehealth: Payer: Self-pay | Admitting: *Deleted

## 2018-03-05 NOTE — Telephone Encounter (Signed)
FYI, I spoke with Lelan Pons today. Thought I had documented as such

## 2018-03-05 NOTE — Telephone Encounter (Signed)
Alexandria Mclaughlin has called to cancel her appt with [name] for the injection, and would like to speak with [name] about the arteriosclerosis problem that showed up on her x Brett last week. Please call her ph [phone].

## 2018-03-05 NOTE — Telephone Encounter (Signed)
Called and spoke with patient.

## 2018-03-07 ENCOUNTER — Other Ambulatory Visit: Payer: Self-pay | Admitting: Physical Medicine & Rehabilitation

## 2018-03-07 DIAGNOSIS — M5416 Radiculopathy, lumbar region: Secondary | ICD-10-CM

## 2018-03-07 DIAGNOSIS — M961 Postlaminectomy syndrome, not elsewhere classified: Secondary | ICD-10-CM

## 2018-03-09 ENCOUNTER — Encounter: Payer: Medicare HMO | Admitting: Physical Therapy

## 2018-03-10 DIAGNOSIS — I700 Atherosclerosis of aorta: Secondary | ICD-10-CM | POA: Diagnosis not present

## 2018-03-10 DIAGNOSIS — M8008XA Age-related osteoporosis with current pathological fracture, vertebra(e), initial encounter for fracture: Secondary | ICD-10-CM | POA: Diagnosis not present

## 2018-03-10 DIAGNOSIS — E782 Mixed hyperlipidemia: Secondary | ICD-10-CM | POA: Diagnosis not present

## 2018-03-10 DIAGNOSIS — M810 Age-related osteoporosis without current pathological fracture: Secondary | ICD-10-CM | POA: Diagnosis not present

## 2018-03-11 ENCOUNTER — Other Ambulatory Visit: Payer: Self-pay | Admitting: Physical Medicine & Rehabilitation

## 2018-03-11 ENCOUNTER — Ambulatory Visit: Payer: Medicare HMO

## 2018-03-11 ENCOUNTER — Ambulatory Visit: Payer: Medicare HMO | Admitting: Physical Medicine & Rehabilitation

## 2018-03-11 ENCOUNTER — Encounter

## 2018-03-11 DIAGNOSIS — M5416 Radiculopathy, lumbar region: Secondary | ICD-10-CM

## 2018-03-11 DIAGNOSIS — M961 Postlaminectomy syndrome, not elsewhere classified: Secondary | ICD-10-CM

## 2018-03-11 MED ORDER — GABAPENTIN 600 MG PO TABS: 600.0000 mg | ORAL_TABLET | Freq: Four times a day (QID) | ORAL | 2 refills | Status: DC

## 2018-03-11 NOTE — Addendum Note (Signed)
Addended by: Caro Hight on: 03/11/2018 02:33 PM   Modules accepted: Orders

## 2018-03-16 ENCOUNTER — Encounter: Payer: Medicare HMO | Admitting: Physical Therapy

## 2018-03-16 ENCOUNTER — Other Ambulatory Visit: Payer: Self-pay | Admitting: Physical Medicine & Rehabilitation

## 2018-03-16 DIAGNOSIS — M5416 Radiculopathy, lumbar region: Secondary | ICD-10-CM

## 2018-03-17 DIAGNOSIS — M5416 Radiculopathy, lumbar region: Secondary | ICD-10-CM | POA: Diagnosis not present

## 2018-03-17 DIAGNOSIS — I10 Essential (primary) hypertension: Secondary | ICD-10-CM | POA: Diagnosis not present

## 2018-03-23 ENCOUNTER — Encounter: Payer: Medicare HMO | Admitting: Physical Therapy

## 2018-03-30 ENCOUNTER — Encounter: Payer: Medicare HMO | Admitting: Physical Therapy

## 2018-03-31 ENCOUNTER — Other Ambulatory Visit: Payer: Self-pay | Admitting: Neurological Surgery

## 2018-03-31 DIAGNOSIS — M5416 Radiculopathy, lumbar region: Secondary | ICD-10-CM

## 2018-04-02 ENCOUNTER — Other Ambulatory Visit: Payer: Self-pay | Admitting: Physical Medicine & Rehabilitation

## 2018-04-02 ENCOUNTER — Ambulatory Visit
Admission: RE | Admit: 2018-04-02 | Discharge: 2018-04-02 | Disposition: A | Discharge status: Home / Self Care | Payer: Medicare HMO | Source: Ambulatory Visit | Attending: Neurological Surgery | Admitting: Neurological Surgery

## 2018-04-02 DIAGNOSIS — M5416 Radiculopathy, lumbar region: Secondary | ICD-10-CM

## 2018-04-02 DIAGNOSIS — S22010A Wedge compression fracture of first thoracic vertebra, initial encounter for closed fracture: Secondary | ICD-10-CM | POA: Diagnosis not present

## 2018-04-02 DIAGNOSIS — G253 Myoclonus: Secondary | ICD-10-CM

## 2018-04-02 MED ORDER — GADOBENATE DIMEGLUMINE 529 MG/ML IV SOLN
12.0000 mL | Freq: Once | INTRAVENOUS | Status: AC | PRN
  Administered 2018-04-02: 12 mL via INTRAVENOUS

## 2018-04-05 ENCOUNTER — Encounter: Payer: Medicare HMO | Attending: Physical Medicine and Rehabilitation | Admitting: Physical Medicine & Rehabilitation

## 2018-04-05 ENCOUNTER — Encounter: Payer: Self-pay | Admitting: Physical Medicine & Rehabilitation

## 2018-04-05 VITALS — BP 136/88 | HR 76 | Resp 14 | Ht 63.0 in | Wt 134.0 lb

## 2018-04-05 DIAGNOSIS — M2170 Unequal limb length (acquired), unspecified site: Secondary | ICD-10-CM | POA: Diagnosis not present

## 2018-04-05 DIAGNOSIS — G894 Chronic pain syndrome: Secondary | ICD-10-CM | POA: Diagnosis not present

## 2018-04-05 DIAGNOSIS — M5416 Radiculopathy, lumbar region: Secondary | ICD-10-CM

## 2018-04-05 DIAGNOSIS — M533 Sacrococcygeal disorders, not elsewhere classified: Secondary | ICD-10-CM | POA: Insufficient documentation

## 2018-04-05 DIAGNOSIS — Z5181 Encounter for therapeutic drug level monitoring: Secondary | ICD-10-CM | POA: Diagnosis not present

## 2018-04-05 DIAGNOSIS — M961 Postlaminectomy syndrome, not elsewhere classified: Secondary | ICD-10-CM | POA: Diagnosis not present

## 2018-04-05 DIAGNOSIS — M47816 Spondylosis without myelopathy or radiculopathy, lumbar region: Secondary | ICD-10-CM | POA: Diagnosis not present

## 2018-04-05 DIAGNOSIS — Z79891 Long term (current) use of opiate analgesic: Secondary | ICD-10-CM

## 2018-04-05 MED ORDER — OXYCODONE-ACETAMINOPHEN 10-325 MG PO TABS: 1.0000 | ORAL_TABLET | Freq: Three times a day (TID) | ORAL | 0 refills | Status: DC | PRN

## 2018-04-05 MED ORDER — FENTANYL 25 MCG/HR TD PT72: 1.0000 | MEDICATED_PATCH | TRANSDERMAL | 0 refills | Status: DC

## 2018-04-05 NOTE — Patient Instructions (Signed)
PLEASE FEEL FREE TO CALL OUR OFFICE WITH ANY PROBLEMS OR QUESTIONS ([phone])      

## 2018-04-05 NOTE — Progress Notes (Signed)
Subjective:    Patient ID: Alexandria Mclaughlin, female    DOB: May 01, 1940, 78 y.o.   MRN: [id number]  HPI    Alexandria Mclaughlin is here in follow-up of her chronic pain.  She had a fall in January and suffering a L1 compression fracture.  She has followed up with neurosurgery and ordered MRI of her lumbar spine.  This was performed on Friday.  Results are as follows:  T12-L1: Slight retropulsion of the L1 superior endplate. No significant foraminal or spinal canal stenosis.  L1-2: No significant disc displacement, foraminal stenosis, or canal stenosis.  L2-3: Stable mild disc bulge and facet hypertrophy. No significant foraminal or canal stenosis.  L3-4: Stable disc bulge eccentric to the left combined with facet and ligamentum flavum hypertrophy. Mild left foraminal and lateral recess stenosis. No canal stenosis.  L4-5: Stable grade 1 anterolisthesis with small uncovered disc bulge as well as severe facet and ligamentum flavum hypertrophy greater on the right. Moderate right and mild left foraminal stenosis. Severe spinal canal stenosis.  L5-S1: Facet and interbody fusion. Stable ossific ridging extending into the neural foramen results in mild bilateral foraminal stenosis. No canal stenosis.   She does feel that she is doing a bit better over the last week or 2 with pain but pain is still significant in the back.  She has some radiation down the right leg as well which has been present before.  She has follow-up with neurosurgery this week regarding next steps.  On a positive note, her right shoulder is feeling better.  She is doing her home exercises.  She has been back to formal therapy recently due to the fall and lack of a driver.  She has fairly painless range of motion at present.  She remains on fentanyl patch and Percocet for pain control as prescribed.  Pain Inventory Average Pain 5 Pain Right Now 5 My pain is intermittent, sharp and stabbing  In the last 24 hours, has pain  interfered with the following? General activity 6 Relation with others 6 Enjoyment of life 7 What TIME of day is your pain at its worst? daytime Sleep (in general) Good  Pain is worse with: walking, sitting and standing Pain improves with: rest, heat/ice and medication Relief from Meds: 6  Mobility walk with assistance use a cane Do you have any goals in this area?  no  Function Do you have any goals in this area?  no  Neuro/Psych trouble walking dizziness  Prior Studies Any changes since last visit?  no  Physicians involved in your care Any changes since last visit?  no   Family History  Problem Relation Age of Onset  . Diabetes Mother   . Heart disease Mother   . Stroke Mother   . Heart disease Father   . Heart disease Brother    Social History   Socioeconomic History  . Marital status: Widowed    Spouse name: Not on file  . Number of children: Not on file  . Years of education: Not on file  . Highest education level: Not on file  Occupational History  . Not on file  Social Needs  . Financial resource strain: Not on file  . Food insecurity:    Worry: Not on file    Inability: Not on file  . Transportation needs:    Medical: Not on file    Non-medical: Not on file  Tobacco Use  . Smoking status: Former Smoker    Packs/day: 0.50  Years: 5.00    Pack years: 2.50    Types: Cigarettes    Last attempt to quit: 03/15/1968    Years since quitting: 50.0  . Smokeless tobacco: Never Used  Substance and Sexual Activity  . Alcohol use: Yes    Alcohol/week: 1.0 standard drinks    Types: 1 Glasses of wine per week    Comment: occasional glass of wine  . Drug use: No  . Sexual activity: Not on file  Lifestyle  . Physical activity:    Days per week: Not on file    Minutes per session: Not on file  . Stress: Not on file  Relationships  . Social connections:    Talks on phone: Not on file    Gets together: Not on file    Attends religious service: Not  on file    Active member of club or organization: Not on file    Attends meetings of clubs or organizations: Not on file    Relationship status: Not on file  Other Topics Concern  . Not on file  Social History Narrative  . Not on file   Past Surgical History:  Procedure Laterality Date  . BACK SURGERY     2011  . RETINAL DETACHMENT SURGERY  9/13   Past Medical History:  Diagnosis Date  . Cervical facet syndrome   . Depression   . Disorders of sacrum   . Enthesopathy of hip region   . Headache   . Hypertension   . Sciatic neuritis   . Synovitis and tenosynovitis   . Vision abnormalities    BP 136/88   Pulse 76   Resp 14   Ht 5\' 3"  (1.6 m)   Wt 134 lb (60.8 kg)   SpO2 93%   BMI 23.74 kg/m   Opioid Risk Score:   Fall Risk Score:  `1  Depression screen PHQ 2/9  Depression screen [facility] 2/9 01/25/2018 10/12/2017 06/03/2017 04/08/2017 10/22/2015 05/23/2015 03/20/2015  Decreased Interest 0 0 0 0 0 0 0  Down, Depressed, Hopeless 0 0 0 0 0 0 0  PHQ - 2 Score 0 0 0 0 0 0 0  Altered sleeping - - 0 - - 0 -  Tired, decreased energy - - 0 - - 3 -  Change in appetite - - 0 - - 3 -  Feeling bad or failure about yourself  - - 0 - - 0 -  Trouble concentrating - - 0 - - 0 -  Moving slowly or fidgety/restless - - 0 - - 0 -  Suicidal thoughts - - 0 - - 0 -  PHQ-9 Score - - 0 - - 6 -  Some recent data might be hidden    Review of Systems  Constitutional: Negative.   HENT: Negative.   Eyes: Negative.   Respiratory: Negative.   Cardiovascular: Negative.   Gastrointestinal: Negative.   Endocrine: Negative.   Genitourinary: Negative.   Musculoskeletal: Positive for back pain and gait problem.  Skin: Negative.   Allergic/Immunologic: Negative.   Neurological: Positive for dizziness.  Hematological: Negative.   Psychiatric/Behavioral: Negative.   All other systems reviewed and are negative.      Objective:   Physical Exam General: No acute distress HEENT: EOMI, oral membranes  moist Cards: reg rate  Chest: normal effort Abdomen: Soft, NT, ND Skin: dry, intact Extremities: no edema  Neurological:she is alert and appropriate.strength 5/5UE, 3-4/5 RLE and 4/5 LLE. DTr's 1+ to trace in LE's. Motor and  sensory exam is stable. Musc:right shoulder almost pain free with internal and external rotation.  She is able to abduct her shoulder fairly painlessly as well.  Low back is tender to palpation throughout the mid to lower lumbar spine.  She has pain with extension and flexion and needs extra time to transfer.  She is using a cane for balance.Marland Kitchen  Psychiatric:pleasant .         1.  Chronic low back pain with lumbar postlaminectomy syndrome.  Recent fall with new L1 compression fracture.:  -continue fentanyl patch25mcgevery 72 hours, #10. ContinuePercocet 10/325 #75.   -We will continue the controlled substance monitoring program, this consists of regular clinic visits, examinations, routine drug screening, pill counts as well as use of [place] Controlled Substance Reporting System. [place] today.   Medication was refilled and a second prescription was sent to the patient's pharmacy for next month.   -UDS today -NS follow up this week 2. Neuropathic Pain/right sided lumbar radicular pain, L4?:  -Continuegabapentin to 600mg  QID. -continue HEP as possible, might benefit from formal physical therapy                 -Consider epidural injection at this level also. 3. Gait Disorder: Continue HEP /Neurology Following -Continue straight cane for balance if needed  4. Myoclonus with sleep: Continue Klonopin 5. Lumbar facet arthropathy: had previous results with MBB's.  Any further injections are on the back burner given her new findings. 6.Osteoarthritis of hands. Voltaren gel 3 times daily.  7. Right shoulder pain. . -improved with injections and therapy  -continue HEP which  we reviewed today 8. Right jaw pain related to abscessed tooth.  improved    65minutes of face to face patient care time was spent during this visit. All questions were encouraged and answered. Follow-up with me in about 2 months time

## 2018-04-07 DIAGNOSIS — M810 Age-related osteoporosis without current pathological fracture: Secondary | ICD-10-CM | POA: Diagnosis not present

## 2018-04-07 DIAGNOSIS — I10 Essential (primary) hypertension: Secondary | ICD-10-CM | POA: Diagnosis not present

## 2018-04-07 DIAGNOSIS — M5416 Radiculopathy, lumbar region: Secondary | ICD-10-CM | POA: Diagnosis not present

## 2018-04-10 LAB — TOXASSURE SELECT,+ANTIDEPR,UR

## 2018-04-12 ENCOUNTER — Other Ambulatory Visit: Payer: Self-pay | Admitting: Neurological Surgery

## 2018-04-15 ENCOUNTER — Telehealth: Payer: Self-pay | Admitting: *Deleted

## 2018-04-15 NOTE — Telephone Encounter (Signed)
Urine drug screen for this encounter is consistent for prescribed medication. Small amount of lorazepam was prescribed this month.

## 2018-04-17 ENCOUNTER — Other Ambulatory Visit: Payer: Self-pay | Admitting: Physical Medicine & Rehabilitation

## 2018-04-19 ENCOUNTER — Other Ambulatory Visit: Payer: Self-pay | Admitting: Physical Medicine & Rehabilitation

## 2018-04-19 NOTE — Pre-Procedure Instructions (Signed)
Alexandria Mclaughlin  04/19/2018      [id number] - [place], [place] [address] [place] [address] Phone: [phone] Fax: [place] [address], [place] [address] Phone: [phone] Fax: [phone]  [place], [place] [address] Phone: [phone] Fax: [phone]    Your procedure is scheduled on Monday, March 2nd.  Report to [facility] Admitting Entrance "A" at 8:25 A.M.  Call this number if you have problems the morning of surgery:  [phone]   Remember:  Do not eat or drink after midnight.    Take these medicines the morning of surgery with A SIP OF WATER  DULoxetine (CYMBALTA) gabapentin (NEURONTIN)   As needed:  oxyCODONE-acetaminophen (PERCOCET)  As of today, STOP taking any Aspirin (unless otherwise instructed by your surgeon), Aleve, Naproxen, Ibuprofen, Motrin, Advil, Goody's, BC's, all herbal medications, fish oil, and all vitamins. This includes: aspirin-acetaminophen-caffeine (EXCEDRIN MIGRAINE) and diclofenac sodium (VOLTAREN) gel.    Do not wear jewelry, make-up or nail polish.  Do not wear lotions, powders, or perfumes, or deodorant.  Do not shave 48 hours prior to surgery.    Do not bring valuables to the hospital.  [facility] is not responsible for any belongings or valuables.  Contacts, dentures or bridgework may not be worn into surgery.  Leave your suitcase in the car.  After surgery it may be brought to your room.  For patients admitted to the hospital, discharge time will be determined by your treatment team.  Patients discharged the day of surgery will not be allowed to drive home.   Special instructions:   [facility]- Preparing For Surgery  Before surgery, you can play an important role. Because skin is not sterile, your skin  needs to be as free of germs as possible. You can reduce the number of germs on your skin by washing with CHG (chlorahexidine gluconate) Soap before surgery.  CHG is an antiseptic cleaner which kills germs and bonds with the skin to continue killing germs even after washing.    Oral Hygiene is also important to reduce your risk of infection.  Remember - BRUSH YOUR TEETH THE MORNING OF SURGERY WITH YOUR REGULAR TOOTHPASTE  Please do not use if you have an allergy to CHG or antibacterial soaps. If your skin becomes reddened/irritated stop using the CHG.  Do not shave (including legs and underarms) for at least 48 hours prior to first CHG shower. It is OK to shave your face.  Please follow these instructions carefully.   1. Shower the NIGHT BEFORE SURGERY and the MORNING OF SURGERY with CHG.   2. If you chose to wash your hair, wash your hair first as usual with your normal shampoo.  3. After you shampoo, rinse your hair and body thoroughly to remove the shampoo.  4. Use CHG as you would any other liquid soap. You can apply CHG directly to the skin and wash gently with a scrungie or a clean washcloth.   5. Apply the CHG Soap to your body ONLY FROM THE NECK DOWN.  Do not use on open wounds or open sores. Avoid contact with your eyes, ears, mouth and genitals (private parts). Wash Face and genitals (private parts)  with your normal soap.  6. Wash thoroughly, paying special attention to the area  where your surgery will be performed.  7. Thoroughly rinse your body with warm water from the neck down.  8. DO NOT shower/wash with your normal soap after using and rinsing off the CHG Soap.  9. Pat yourself dry with a CLEAN TOWEL.  10. Wear CLEAN PAJAMAS to bed the night before surgery, wear comfortable clothes the morning of surgery  11. Place CLEAN SHEETS on your bed the night of your first shower and DO NOT SLEEP WITH PETS.    Day of Surgery:  Do not apply any deodorants/lotions.  Please  wear clean clothes to the hospital/surgery center.   Remember to brush your teeth WITH YOUR REGULAR TOOTHPASTE.    Please read over the following fact sheets that you were given.

## 2018-04-20 ENCOUNTER — Other Ambulatory Visit: Payer: Self-pay

## 2018-04-20 ENCOUNTER — Encounter (HOSPITAL_COMMUNITY): Payer: Self-pay

## 2018-04-20 ENCOUNTER — Encounter (HOSPITAL_COMMUNITY)
Admission: RE | Admit: 2018-04-20 | Discharge: 2018-04-20 | Disposition: A | Discharge status: Home / Self Care | Payer: Medicare HMO | Source: Ambulatory Visit | Attending: Neurological Surgery | Admitting: Neurological Surgery

## 2018-04-20 DIAGNOSIS — I10 Essential (primary) hypertension: Secondary | ICD-10-CM | POA: Insufficient documentation

## 2018-04-20 DIAGNOSIS — Z01818 Encounter for other preprocedural examination: Secondary | ICD-10-CM | POA: Diagnosis not present

## 2018-04-20 HISTORY — DX: Personal history of urinary calculi: Z87.442

## 2018-04-20 HISTORY — DX: Gastro-esophageal reflux disease without esophagitis: K21.9

## 2018-04-20 LAB — TYPE AND SCREEN
ABO/RH(D): AB NEG
Antibody Screen: NEGATIVE

## 2018-04-20 LAB — BASIC METABOLIC PANEL
Anion gap: 6 (ref 5–15)
BUN: 21 mg/dL (ref 8–23)
CO2: 26 mmol/L (ref 22–32)
Calcium: 9.1 mg/dL (ref 8.9–10.3)
Chloride: 109 mmol/L (ref 98–111)
Creatinine, Ser: 0.49 mg/dL (ref 0.44–1.00)
GFR calc Af Amer: >60 mL/min (ref >60)
GFR calc non Af Amer: >60 mL/min (ref >60)
Glucose, Bld: 101 mg/dL — ABNORMAL HIGH (ref 70–99)
Potassium: 4.1 mmol/L (ref 3.5–5.1)
Sodium: 141 mmol/L (ref 135–145)

## 2018-04-20 LAB — CBC
HCT: 44.8 % (ref 36.0–46.0)
Hemoglobin: 13.5 g/dL (ref 12.0–15.0)
MCH: 28.7 pg (ref 26.0–34.0)
MCHC: 30.1 g/dL (ref 30.0–36.0)
MCV: 95.3 fL (ref 80.0–100.0)
Platelets: 210 10*3/uL (ref 150–400)
RBC: 4.7 MIL/uL (ref 3.87–5.11)
RDW: 12.2 % (ref 11.5–15.5)
WBC: 6.5 10*3/uL (ref 4.0–10.5)
nRBC: 0 % (ref 0.0–0.2)

## 2018-04-20 LAB — SURGICAL PCR SCREEN
MRSA, PCR: NEGATIVE
Staphylococcus aureus: NEGATIVE

## 2018-04-20 NOTE — Progress Notes (Signed)
PCP - Dr. Merrilee Seashore Cardiologist - denies  Chest x-[place] - N/A EKG - 04/20/18 Stress Test - 20+ years  ECHO - 2016 Cardiac Cath -denies   Sleep Study - denies CPAP - denies  Blood Thinner Instructions:N/A Aspirin Instructions:N/A  Anesthesia review: No  Patient denies shortness of breath, fever, cough and chest pain at PAT appointment   Patient verbalized understanding of instructions that were given to them at the PAT appointment. Patient was also instructed that they will need to review over the PAT instructions again at home before surgery.

## 2018-04-26 ENCOUNTER — Inpatient Hospital Stay (HOSPITAL_COMMUNITY): Payer: Medicare HMO

## 2018-04-26 ENCOUNTER — Other Ambulatory Visit: Payer: Self-pay

## 2018-04-26 ENCOUNTER — Inpatient Hospital Stay (HOSPITAL_COMMUNITY): Payer: Medicare HMO | Admitting: Physician Assistant

## 2018-04-26 ENCOUNTER — Encounter (HOSPITAL_COMMUNITY): Payer: Self-pay | Admitting: *Deleted

## 2018-04-26 ENCOUNTER — Inpatient Hospital Stay (HOSPITAL_COMMUNITY)
Admission: RE | Admit: 2018-04-26 | Discharge: 2018-04-27 | DRG: 460 | Disposition: A | Discharge status: Home Health | Payer: Medicare HMO | Attending: Neurological Surgery | Admitting: Neurological Surgery

## 2018-04-26 ENCOUNTER — Encounter (HOSPITAL_COMMUNITY)
Admission: RE | Disposition: A | Discharge status: Home Health | Payer: Self-pay | Source: Home / Self Care | Attending: Neurological Surgery

## 2018-04-26 ENCOUNTER — Inpatient Hospital Stay (HOSPITAL_COMMUNITY): Payer: Medicare HMO | Admitting: Anesthesiology

## 2018-04-26 DIAGNOSIS — Z8249 Family history of ischemic heart disease and other diseases of the circulatory system: Secondary | ICD-10-CM | POA: Diagnosis not present

## 2018-04-26 DIAGNOSIS — F329 Major depressive disorder, single episode, unspecified: Secondary | ICD-10-CM | POA: Diagnosis present

## 2018-04-26 DIAGNOSIS — M5416 Radiculopathy, lumbar region: Secondary | ICD-10-CM

## 2018-04-26 DIAGNOSIS — Z87442 Personal history of urinary calculi: Secondary | ICD-10-CM

## 2018-04-26 DIAGNOSIS — L905 Scar conditions and fibrosis of skin: Secondary | ICD-10-CM | POA: Diagnosis not present

## 2018-04-26 DIAGNOSIS — Z87891 Personal history of nicotine dependence: Secondary | ICD-10-CM

## 2018-04-26 DIAGNOSIS — Z419 Encounter for procedure for purposes other than remedying health state, unspecified: Secondary | ICD-10-CM

## 2018-04-26 DIAGNOSIS — I10 Essential (primary) hypertension: Secondary | ICD-10-CM | POA: Diagnosis not present

## 2018-04-26 DIAGNOSIS — M48061 Spinal stenosis, lumbar region without neurogenic claudication: Principal | ICD-10-CM | POA: Diagnosis present

## 2018-04-26 DIAGNOSIS — M5116 Intervertebral disc disorders with radiculopathy, lumbar region: Secondary | ICD-10-CM | POA: Diagnosis not present

## 2018-04-26 DIAGNOSIS — M545 Low back pain: Secondary | ICD-10-CM | POA: Diagnosis not present

## 2018-04-26 DIAGNOSIS — R69 Illness, unspecified: Secondary | ICD-10-CM | POA: Diagnosis not present

## 2018-04-26 DIAGNOSIS — M4326 Fusion of spine, lumbar region: Secondary | ICD-10-CM | POA: Diagnosis not present

## 2018-04-26 HISTORY — DX: Radiculopathy, lumbar region: M54.16

## 2018-04-26 HISTORY — PX: TRANSFORAMINAL LUMBAR INTERBODY FUSION W/ MIS 1 LEVEL: SHX6145

## 2018-04-26 SURGERY — MINIMALLY INVASIVE (MIS) TRANSFORAMINAL LUMBAR INTERBODY FUSION (TLIF) 1 LEVEL
Anesthesia: General | Site: Spine Lumbar | Laterality: Right

## 2018-04-26 MED ORDER — ONDANSETRON HCL 4 MG/2ML IJ SOLN
INTRAMUSCULAR | Status: DC | PRN
  Administered 2018-04-26: 4 mg via INTRAVENOUS

## 2018-04-26 MED ORDER — LIDOCAINE-EPINEPHRINE 1 %-1:100000 IJ SOLN
INTRAMUSCULAR | Status: DC | PRN
  Administered 2018-04-26: 10 mL

## 2018-04-26 MED ORDER — LACTATED RINGERS IV SOLN
INTRAVENOUS | Status: DC
  Administered 2018-04-26 (×2): via INTRAVENOUS

## 2018-04-26 MED ORDER — MIDAZOLAM HCL 2 MG/2ML IJ SOLN
INTRAMUSCULAR | Status: AC
  Filled 2018-04-26: qty 2

## 2018-04-26 MED ORDER — ROCURONIUM BROMIDE 50 MG/5ML IV SOSY
PREFILLED_SYRINGE | INTRAVENOUS | Status: AC
  Filled 2018-04-26: qty 5

## 2018-04-26 MED ORDER — THROMBIN 5000 UNITS EX SOLR
CUTANEOUS | Status: AC
  Filled 2018-04-26: qty 5000

## 2018-04-26 MED ORDER — PROPOFOL 10 MG/ML IV BOLUS
INTRAVENOUS | Status: AC
  Filled 2018-04-26: qty 20

## 2018-04-26 MED ORDER — PHENOL 1.4 % MT LIQD: 1.0000 | OROMUCOSAL | Status: DC | PRN

## 2018-04-26 MED ORDER — MIDAZOLAM HCL 5 MG/5ML IJ SOLN
INTRAMUSCULAR | Status: DC | PRN
  Administered 2018-04-26: 2 mg via INTRAVENOUS

## 2018-04-26 MED ORDER — ACETAMINOPHEN 500 MG PO TABS
1000.0000 mg | ORAL_TABLET | Freq: Once | ORAL | Status: AC
  Administered 2018-04-26: 1000 mg via ORAL

## 2018-04-26 MED ORDER — ROSUVASTATIN CALCIUM 20 MG PO TABS
20.0000 mg | ORAL_TABLET | Freq: Every day | ORAL | Status: DC
  Administered 2018-04-26 – 2018-04-27 (×2): 20 mg via ORAL
  Filled 2018-04-26 (×2): qty 1

## 2018-04-26 MED ORDER — MELATONIN 3 MG PO TABS
4.5000 mg | ORAL_TABLET | Freq: Every evening | ORAL | Status: DC | PRN
  Filled 2018-04-26: qty 1.5

## 2018-04-26 MED ORDER — THROMBIN 5000 UNITS EX SOLR
OROMUCOSAL | Status: DC | PRN
  Administered 2018-04-26: 5 mL via TOPICAL

## 2018-04-26 MED ORDER — ACETAMINOPHEN 500 MG PO TABS
ORAL_TABLET | ORAL | Status: AC
  Administered 2018-04-26: 1000 mg via ORAL
  Filled 2018-04-26: qty 2

## 2018-04-26 MED ORDER — ROCURONIUM BROMIDE 10 MG/ML (PF) SYRINGE
PREFILLED_SYRINGE | INTRAVENOUS | Status: DC | PRN
  Administered 2018-04-26 (×2): 10 mg via INTRAVENOUS
  Administered 2018-04-26: 50 mg via INTRAVENOUS

## 2018-04-26 MED ORDER — ACETAMINOPHEN 650 MG RE SUPP: 650.0000 mg | RECTAL | Status: DC | PRN

## 2018-04-26 MED ORDER — POLYETHYLENE GLYCOL 3350 17 G PO PACK: 17.0000 g | PACK | Freq: Every day | ORAL | Status: DC | PRN

## 2018-04-26 MED ORDER — MELATONIN 3 MG PO TABS
5.0000 mg | ORAL_TABLET | Freq: Every evening | ORAL | Status: DC | PRN
  Filled 2018-04-26: qty 1.5

## 2018-04-26 MED ORDER — SUFENTANIL CITRATE 50 MCG/ML IV SOLN
INTRAVENOUS | Status: AC
  Filled 2018-04-26: qty 1

## 2018-04-26 MED ORDER — ONDANSETRON HCL 4 MG PO TABS: 4.0000 mg | ORAL_TABLET | Freq: Four times a day (QID) | ORAL | Status: DC | PRN

## 2018-04-26 MED ORDER — LIDOCAINE-EPINEPHRINE 1 %-1:100000 IJ SOLN
INTRAMUSCULAR | Status: AC
  Filled 2018-04-26: qty 1

## 2018-04-26 MED ORDER — TRAZODONE HCL 100 MG PO TABS
100.0000 mg | ORAL_TABLET | Freq: Every day | ORAL | Status: DC
  Administered 2018-04-26: 100 mg via ORAL
  Filled 2018-04-26 (×3): qty 1

## 2018-04-26 MED ORDER — OXYCODONE HCL 5 MG PO TABS: 5.0000 mg | ORAL_TABLET | ORAL | Status: DC | PRN

## 2018-04-26 MED ORDER — MENTHOL 3 MG MT LOZG: 1.0000 | LOZENGE | OROMUCOSAL | Status: DC | PRN

## 2018-04-26 MED ORDER — CHLORHEXIDINE GLUCONATE CLOTH 2 % EX PADS: 6.0000 | MEDICATED_PAD | Freq: Once | CUTANEOUS | Status: DC

## 2018-04-26 MED ORDER — CLONAZEPAM 0.5 MG PO TABS
0.5000 mg | ORAL_TABLET | Freq: Every day | ORAL | Status: DC
  Administered 2018-04-26: 0.5 mg via ORAL
  Filled 2018-04-26: qty 1

## 2018-04-26 MED ORDER — DOCUSATE SODIUM 100 MG PO CAPS
100.0000 mg | ORAL_CAPSULE | Freq: Two times a day (BID) | ORAL | Status: DC
  Administered 2018-04-26 – 2018-04-27 (×2): 100 mg via ORAL
  Filled 2018-04-26 (×2): qty 1

## 2018-04-26 MED ORDER — 0.9 % SODIUM CHLORIDE (POUR BTL) OPTIME
TOPICAL | Status: DC | PRN
  Administered 2018-04-26: 1000 mL

## 2018-04-26 MED ORDER — LIDOCAINE 2% (20 MG/ML) 5 ML SYRINGE
INTRAMUSCULAR | Status: DC | PRN
  Administered 2018-04-26: 60 mg via INTRAVENOUS

## 2018-04-26 MED ORDER — SODIUM CHLORIDE 0.9% FLUSH: 3.0000 mL | Freq: Two times a day (BID) | INTRAVENOUS | Status: DC

## 2018-04-26 MED ORDER — HYDROMORPHONE HCL 1 MG/ML IJ SOLN
0.2500 mg | INTRAMUSCULAR | Status: DC | PRN
  Administered 2018-04-26: 0.5 mg via INTRAVENOUS
  Administered 2018-04-26 (×2): 0.25 mg via INTRAVENOUS

## 2018-04-26 MED ORDER — DEXAMETHASONE SODIUM PHOSPHATE 10 MG/ML IJ SOLN
INTRAMUSCULAR | Status: DC | PRN
  Administered 2018-04-26: 10 mg via INTRAVENOUS

## 2018-04-26 MED ORDER — CEFAZOLIN SODIUM-DEXTROSE 2-4 GM/100ML-% IV SOLN
2.0000 g | Freq: Three times a day (TID) | INTRAVENOUS | Status: AC
  Administered 2018-04-26 – 2018-04-27 (×2): 2 g via INTRAVENOUS
  Filled 2018-04-26 (×2): qty 100

## 2018-04-26 MED ORDER — SUFENTANIL CITRATE 50 MCG/ML IV SOLN
INTRAVENOUS | Status: DC | PRN
  Administered 2018-04-26 (×4): 10 ug via INTRAVENOUS
  Administered 2018-04-26: 20 ug via INTRAVENOUS

## 2018-04-26 MED ORDER — HYDROMORPHONE HCL 1 MG/ML IJ SOLN: 1.0000 mg | INTRAMUSCULAR | Status: DC | PRN

## 2018-04-26 MED ORDER — SODIUM CHLORIDE 0.9 % IV SOLN
INTRAVENOUS | Status: DC | PRN
  Administered 2018-04-26: 40 ug/min via INTRAVENOUS

## 2018-04-26 MED ORDER — ACETAMINOPHEN 325 MG PO TABS
650.0000 mg | ORAL_TABLET | ORAL | Status: DC | PRN
  Administered 2018-04-26: 650 mg via ORAL
  Filled 2018-04-26 (×2): qty 2

## 2018-04-26 MED ORDER — ONDANSETRON HCL 4 MG/2ML IJ SOLN: 4.0000 mg | Freq: Four times a day (QID) | INTRAMUSCULAR | Status: DC | PRN

## 2018-04-26 MED ORDER — LIDOCAINE 2% (20 MG/ML) 5 ML SYRINGE
INTRAMUSCULAR | Status: AC
  Filled 2018-04-26: qty 5

## 2018-04-26 MED ORDER — PHENYLEPHRINE 40 MCG/ML (10ML) SYRINGE FOR IV PUSH (FOR BLOOD PRESSURE SUPPORT)
PREFILLED_SYRINGE | INTRAVENOUS | Status: DC | PRN
  Administered 2018-04-26: 40 ug via INTRAVENOUS
  Administered 2018-04-26: 80 ug via INTRAVENOUS
  Administered 2018-04-26: 40 ug via INTRAVENOUS
  Administered 2018-04-26: 80 ug via INTRAVENOUS

## 2018-04-26 MED ORDER — OXYCODONE HCL 5 MG PO TABS
10.0000 mg | ORAL_TABLET | ORAL | Status: DC | PRN
  Administered 2018-04-26 – 2018-04-27 (×5): 10 mg via ORAL
  Filled 2018-04-26 (×4): qty 2

## 2018-04-26 MED ORDER — KETAMINE HCL 10 MG/ML IJ SOLN
INTRAMUSCULAR | Status: DC | PRN
  Administered 2018-04-26 (×2): 10 mg via INTRAVENOUS
  Administered 2018-04-26: 30 mg via INTRAVENOUS

## 2018-04-26 MED ORDER — ADULT MULTIVITAMIN W/MINERALS CH: 1.0000 | ORAL_TABLET | Freq: Every day | ORAL | Status: DC

## 2018-04-26 MED ORDER — QUINAPRIL HCL 10 MG PO TABS
20.0000 mg | ORAL_TABLET | Freq: Every day | ORAL | Status: DC
  Filled 2018-04-26: qty 2

## 2018-04-26 MED ORDER — HYDROMORPHONE HCL 1 MG/ML IJ SOLN
INTRAMUSCULAR | Status: AC
  Filled 2018-04-26: qty 1

## 2018-04-26 MED ORDER — SODIUM CHLORIDE 0.9% FLUSH: 3.0000 mL | INTRAVENOUS | Status: DC | PRN

## 2018-04-26 MED ORDER — DULOXETINE HCL 30 MG PO CPEP
30.0000 mg | ORAL_CAPSULE | Freq: Every day | ORAL | Status: DC
  Administered 2018-04-27: 30 mg via ORAL
  Filled 2018-04-26: qty 1

## 2018-04-26 MED ORDER — OXYCODONE HCL 5 MG PO TABS
ORAL_TABLET | ORAL | Status: AC
  Filled 2018-04-26: qty 2

## 2018-04-26 MED ORDER — PROPOFOL 10 MG/ML IV BOLUS
INTRAVENOUS | Status: DC | PRN
  Administered 2018-04-26: 110 mg via INTRAVENOUS

## 2018-04-26 MED ORDER — DIAZEPAM 5 MG PO TABS
ORAL_TABLET | ORAL | Status: AC
  Filled 2018-04-26: qty 1

## 2018-04-26 MED ORDER — SODIUM CHLORIDE 0.9 % IV SOLN
INTRAVENOUS | Status: DC | PRN
  Administered 2018-04-26: 500 mL

## 2018-04-26 MED ORDER — DIAZEPAM 5 MG PO TABS
5.0000 mg | ORAL_TABLET | Freq: Four times a day (QID) | ORAL | Status: DC | PRN
  Administered 2018-04-26: 5 mg via ORAL

## 2018-04-26 MED ORDER — GABAPENTIN 600 MG PO TABS
600.0000 mg | ORAL_TABLET | Freq: Four times a day (QID) | ORAL | Status: DC
  Administered 2018-04-26 – 2018-04-27 (×4): 600 mg via ORAL
  Filled 2018-04-26 (×4): qty 1

## 2018-04-26 MED ORDER — SODIUM CHLORIDE 0.9 % IV SOLN: 250.0000 mL | INTRAVENOUS | Status: DC

## 2018-04-26 MED ORDER — CEFAZOLIN SODIUM-DEXTROSE 2-4 GM/100ML-% IV SOLN
2.0000 g | INTRAVENOUS | Status: AC
  Administered 2018-04-26: 2 g via INTRAVENOUS

## 2018-04-26 MED ORDER — CEFAZOLIN SODIUM-DEXTROSE 2-4 GM/100ML-% IV SOLN
INTRAVENOUS | Status: AC
  Filled 2018-04-26: qty 100

## 2018-04-26 MED ORDER — FENTANYL 25 MCG/HR TD PT72: 1.0000 | MEDICATED_PATCH | TRANSDERMAL | Status: DC

## 2018-04-26 MED ORDER — KETAMINE HCL 50 MG/5ML IJ SOSY
PREFILLED_SYRINGE | INTRAMUSCULAR | Status: AC
  Filled 2018-04-26: qty 5

## 2018-04-26 SURGICAL SUPPLY — 76 items
ADH SKN CLS APL DERMABOND .7 (GAUZE/BANDAGES/DRESSINGS) ×1
BAG DECANTER FOR FLEXI CONT (MISCELLANEOUS) ×2 IMPLANT
BASKET BONE COLLECTION (BASKET) ×1 IMPLANT
BLADE CLIPPER SURG (BLADE) IMPLANT
BLADE SURG 11 STRL SS (BLADE) ×2 IMPLANT
BUR 2.5 MTCH HD 16 (BUR) ×1 IMPLANT
BUR MATCHSTICK NEURO 3.0 LAGG (BURR) IMPLANT
BUR PRECISION FLUTE 5.0 (BURR) IMPLANT
CONT SPEC 4OZ CLIKSEAL STRL BL (MISCELLANEOUS) ×2 IMPLANT
COVER BACK TABLE 60X90IN (DRAPES) ×2 IMPLANT
COVER WAND RF STERILE (DRAPES) ×2 IMPLANT
DECANTER SPIKE VIAL GLASS SM (MISCELLANEOUS) ×2 IMPLANT
DERMABOND ADVANCED (GAUZE/BANDAGES/DRESSINGS) ×1
DERMABOND ADVANCED .7 DNX12 (GAUZE/BANDAGES/DRESSINGS) ×1 IMPLANT
DEVICE INTERBODY ELEVATE 23X7 (Cage) ×1 IMPLANT
DRAPE C-ARM 42X72 X-RAY (DRAPES) ×2 IMPLANT
DRAPE C-ARMOR (DRAPES) ×1 IMPLANT
DRAPE LAPAROTOMY 100X72X124 (DRAPES) ×2 IMPLANT
DRAPE MICROSCOPE LEICA (MISCELLANEOUS) ×1 IMPLANT
DRAPE SURG 17X23 STRL (DRAPES) ×4 IMPLANT
ELECT BLADE 6.5 EXT (BLADE) ×2 IMPLANT
ELECT REM PT RETURN 9FT ADLT (ELECTROSURGICAL) ×2
ELECTRODE REM PT RTRN 9FT ADLT (ELECTROSURGICAL) ×1 IMPLANT
GAUZE 4X4 16PLY RFD (DISPOSABLE) ×1 IMPLANT
GAUZE SPONGE 4X4 12PLY STRL (GAUZE/BANDAGES/DRESSINGS) ×1 IMPLANT
GLOVE BIO SURGEON STRL SZ7.5 (GLOVE) ×2 IMPLANT
GLOVE BIOGEL PI IND STRL 6.5 (GLOVE) IMPLANT
GLOVE BIOGEL PI IND STRL 7.5 (GLOVE) ×1 IMPLANT
GLOVE BIOGEL PI IND STRL 8 (GLOVE) IMPLANT
GLOVE BIOGEL PI INDICATOR 6.5 (GLOVE) ×1
GLOVE BIOGEL PI INDICATOR 7.5 (GLOVE) ×2
GLOVE BIOGEL PI INDICATOR 8 (GLOVE) ×3
GLOVE ECLIPSE 7.5 STRL STRAW (GLOVE) ×3 IMPLANT
GLOVE EXAM NITRILE LRG STRL (GLOVE) IMPLANT
GLOVE EXAM NITRILE XL STR (GLOVE) IMPLANT
GLOVE EXAM NITRILE XS STR PU (GLOVE) IMPLANT
GLOVE SURG SS PI 6.0 STRL IVOR (GLOVE) ×2 IMPLANT
GOWN STRL REUS W/ TWL LRG LVL3 (GOWN DISPOSABLE) ×1 IMPLANT
GOWN STRL REUS W/ TWL XL LVL3 (GOWN DISPOSABLE) IMPLANT
GOWN STRL REUS W/TWL 2XL LVL3 (GOWN DISPOSABLE) ×3 IMPLANT
GOWN STRL REUS W/TWL LRG LVL3 (GOWN DISPOSABLE) ×4
GOWN STRL REUS W/TWL XL LVL3 (GOWN DISPOSABLE) ×2
GUIDEWIRE 18IN BLUNT CD HORIZ (WIRE) ×2 IMPLANT
HEMOSTAT POWDER KIT SURGIFOAM (HEMOSTASIS) ×2 IMPLANT
KIT BASIN OR (CUSTOM PROCEDURE TRAY) ×2 IMPLANT
KIT POSITION SURG JACKSON T1 (MISCELLANEOUS) ×2 IMPLANT
KIT TURNOVER KIT B (KITS) ×1 IMPLANT
NDL BEVEL TWO-PAK W/1PK (NEEDLE) IMPLANT
NDL HYPO 18GX1.5 BLUNT FILL (NEEDLE) IMPLANT
NDL SPNL 18GX3.5 QUINCKE PK (NEEDLE) IMPLANT
NEEDLE BEVEL TWO-PAK W/1PK (NEEDLE) ×2 IMPLANT
NEEDLE HYPO 18GX1.5 BLUNT FILL (NEEDLE) IMPLANT
NEEDLE HYPO 22GX1.5 SAFETY (NEEDLE) ×2 IMPLANT
NEEDLE SPNL 18GX3.5 QUINCKE PK (NEEDLE) IMPLANT
NS IRRIG 1000ML POUR BTL (IV SOLUTION) ×2 IMPLANT
PACK LAMINECTOMY NEURO (CUSTOM PROCEDURE TRAY) ×2 IMPLANT
PAD ARMBOARD 7.5X6 YLW CONV (MISCELLANEOUS) ×5 IMPLANT
PASTE BONE GRAFTON 5CC (Bone Implant) ×1 IMPLANT
ROD 5.5X45MM SOLERA VOYAGER (Rod) ×1 IMPLANT
ROD SPINAL 5.5X35 CP 4 TI (Rod) ×1 IMPLANT
RUBBERBAND STERILE (MISCELLANEOUS) ×2 IMPLANT
SCREW 6.5X40 (Screw) ×2 IMPLANT
SCREW SET 5.5/6.0MM SOLERA (Screw) ×2 IMPLANT
SCREW SET SOLERA (Screw) ×4 IMPLANT
SCREW SET SOLERA TI5.5 (Screw) IMPLANT
SCREW SPINAL IFIX 6.5X45 (Screw) ×2 IMPLANT
SPONGE LAP 4X18 RFD (DISPOSABLE) IMPLANT
SUT MNCRL AB 3-0 PS2 18 (SUTURE) ×3 IMPLANT
SUT VIC AB 0 CT1 18XCR BRD8 (SUTURE) IMPLANT
SUT VIC AB 0 CT1 8-18 (SUTURE) ×2
SUT VIC AB 2-0 CP2 18 (SUTURE) ×3 IMPLANT
SYR 3ML LL SCALE MARK (SYRINGE) IMPLANT
TOWEL GREEN STERILE (TOWEL DISPOSABLE) ×2 IMPLANT
TOWEL GREEN STERILE FF (TOWEL DISPOSABLE) ×2 IMPLANT
TRAY FOLEY CATH 16FRSI W/METER (SET/KITS/TRAYS/PACK) ×1 IMPLANT
WATER STERILE IRR 1000ML POUR (IV SOLUTION) ×2 IMPLANT

## 2018-04-26 NOTE — Anesthesia Preprocedure Evaluation (Addendum)
Anesthesia Evaluation  Patient identified by MRN, date of birth, ID band Patient awake    Reviewed: Allergy & Precautions, H&P , NPO status , Patient's Chart, lab work & pertinent test results  Airway Mallampati: II  TM Distance: >3 FB Neck ROM: Full    Dental no notable dental hx. (+) Teeth Intact, Dental Advisory Given   Pulmonary neg pulmonary ROS, former smoker,    Pulmonary exam normal breath sounds clear to auscultation       Cardiovascular hypertension, Pt. on medications  Rhythm:Regular Rate:Normal     Neuro/Psych  Headaches, Depression    GI/Hepatic Neg liver ROS, GERD  ,  Endo/Other  negative endocrine ROS  Renal/GU negative Renal ROS  negative genitourinary   Musculoskeletal  (+) Arthritis , Osteoarthritis,    Abdominal   Peds  Hematology negative hematology ROS (+)   Anesthesia Other Findings   Reproductive/Obstetrics negative OB ROS                            Anesthesia Physical Anesthesia Plan  ASA: II  Anesthesia Plan: General   Post-op Pain Management:    Induction: Intravenous  PONV Risk Score and Plan: 4 or greater and Ondansetron, Dexamethasone, Treatment may vary due to age or medical condition and Midazolam  Airway Management Planned: Oral ETT  Additional Equipment:   Intra-op Plan:   Post-operative Plan: Extubation in OR  Informed Consent: I have reviewed the patients History and Physical, chart, labs and discussed the procedure including the risks, benefits and alternatives for the proposed anesthesia with the patient or authorized representative who has indicated his/her understanding and acceptance.     Dental advisory given  Plan Discussed with: CRNA  Anesthesia Plan Comments:        Anesthesia Quick Evaluation

## 2018-04-26 NOTE — Anesthesia Procedure Notes (Signed)
Procedure Name: Intubation Date/Time: 04/26/2018 10:27 AM Performed by: Moshe Salisbury, CRNA Pre-anesthesia Checklist: Patient identified, Emergency Drugs available, Suction available and Patient being monitored Patient Re-evaluated:Patient Re-evaluated prior to induction Oxygen Delivery Method: Circle System Utilized Preoxygenation: Pre-oxygenation with 100% oxygen Induction Type: IV induction Ventilation: Mask ventilation without difficulty Laryngoscope Size: Mac and 3 Grade View: Grade II Tube type: Oral Tube size: 7.5 mm Number of attempts: 1 Airway Equipment and Method: Stylet Placement Confirmation: ETT inserted through vocal cords under direct vision,  positive ETCO2 and breath sounds checked- equal and bilateral Secured at: 21 cm Tube secured with: Tape Dental Injury: Teeth and Oropharynx as per pre-operative assessment

## 2018-04-26 NOTE — Op Note (Signed)
PATIENT: Alexandria Mclaughlin  DAY OF SURGERY: 04/26/18   PRE-OPERATIVE DIAGNOSIS:  Lumbar radiculopathy   POST-OPERATIVE DIAGNOSIS:  Lumbar radiculopathy   PROCEDURE:  Right L4-5 transforaminal lumbar interbody fusion with bilateral L4-5 pedicle screw placement   SURGEON:  Surgeon(s) and Role:    Judith Part, MD - Primary    Earnie Larsson, MD - Assisting   ANESTHESIA: ETGA   BRIEF HISTORY: This is a 78 year old woman who presented with severe right leg pain in the L4 distribution. The patient was found to have L4-5 foraminal stenosis that required a full facetectomy on the right. This was discussed with the patient as well as risks, benefits, and alternatives and wished to proceed with surgical treatment.   OPERATIVE DETAIL: The patient was taken to the operating room and placed on the OR table in the prone position. A formal time out was performed with two patient identifiers and confirmed the operative site. Anesthesia was induced by the anesthesia team. The operative site was marked, hair was clipped with surgical clippers, the area was then prepped and draped in a sterile fashion. Fluoroscopy was brought into the field and AP/lateral views were used to localize the level. An incision was placed and a Medtronic Quadrant tubular retractor was advanced onto the L4 lamina-facet interface. A right facetectomy was performed at L4-5, which was substantially more difficult than usual due to scar tissue from the patient's prior surgery. When the traversing nerve root was completely free, the lateral lamina was removed to identify the thecal sac and traversing nerve root. A discectomy was performed from the right at L4-5 with fluroscopy. After a complete discectomy, an expandable interbody cage was placed under fluoroscopy and expanded with good positioning. Using the split function of the tubular retractor, pedicle screws were placed through the tube at L4 and L5 using fluoroscopy, then secured with a rod  and caps that were torqued to manufacturer specifications.   On the left side, fluroscopy was used to mark and then create incisions for percutaneous pedicle screws at L4 and L5. Using a Jamshidi needle and then a K wire, then tapped and the screw placed, all with fluoroscopy to confirm position. The rod was placed and secured, both constructs were checked with fluoroscopy with good position and final tightened. Both incisions were copiously irrigated, hemostasis was obtained, then closed in layers in the usual fashion. Final counts were correct and there was no evidence of complications at the end of the procedure.   EBL:  339mL   DRAINS: none   SPECIMENS: None   Judith Part, MD 04/26/18 4:21 PM

## 2018-04-26 NOTE — Brief Op Note (Signed)
04/26/2018  4:19 PM  PATIENT:  Alexandria Mclaughlin  78 y.o. female  PRE-OPERATIVE DIAGNOSIS:  Lumbar radiculopathy, foraminal stenosis  POST-OPERATIVE DIAGNOSIS:  Lumbar radiculopathy, foraminal stenosis  PROCEDURE:  Procedure(s): Right Lumbar four-five Minimally invasive Transforaminal lumbar interbody fusion (Right)  SURGEON:  Surgeon(s) and Role:    * Judith Part, MD - Primary    * Earnie Larsson, MD - Assisting  PHYSICIAN ASSISTANT:   ANESTHESIA:   general  EBL:  300cc   BLOOD ADMINISTERED:none  DRAINS: none   LOCAL MEDICATIONS USED:  LIDOCAINE   SPECIMEN:  No Specimen  DISPOSITION OF SPECIMEN:  N/A  COUNTS:  YES  TOURNIQUET:  * No tourniquets in log *  DICTATION: .Note written in EPIC  PLAN OF CARE: Admit to inpatient   PATIENT DISPOSITION:  PACU - hemodynamically stable.   Delay start of Pharmacological VTE agent (>24hrs) due to surgical blood loss or risk of bleeding: yes

## 2018-04-26 NOTE — H&P (Signed)
Surgical H&P Update  HPI: 78 y.o. woman with right leg pain, here for MIS TLIF. No changes in health since she was last seen. Still having severe right leg pain, no sx in the left leg. Her MRI showed severe central canal stenosis with right L4-5 foraminal stenosis that will require a complete facetectomy and therefore stabilization and fusion. Risks, benefits, and alternatives discussed and pt wishes to proceed with surgery.  PMHx:  Past Medical History:  Diagnosis Date  . Cervical facet syndrome   . Depression   . Disorders of sacrum   . Enthesopathy of hip region   . GERD (gastroesophageal reflux disease)   . Headache   . History of kidney stones   . Hypertension   . Sciatic neuritis   . Synovitis and tenosynovitis   . Vision abnormalities    FamHx:  Family History  Problem Relation Age of Onset  . Diabetes Mother   . Heart disease Mother   . Stroke Mother   . Heart disease Father   . Heart disease Brother    SocHx:  reports that she quit smoking about 50 years ago. Her smoking use included cigarettes. She has a 2.50 pack-year smoking history. She has never used smokeless tobacco. She reports previous alcohol use of about 1.0 standard drinks of alcohol per week. She reports that she does not use drugs.  Physical Exam: AOx3, PERRL, FS, TM  Strength 5/5 x4, SILTx4  Assesment/Plan: 78 y.o. woman with RLE leg pain, here for L4-5 MIS TLIF. Risks, benefits, and alternatives discussed and the patient would like to continue with surgery. -OR today -3C post-op  Judith Part, MD 04/26/18 10:12 AM

## 2018-04-26 NOTE — Transfer of Care (Signed)
Immediate Anesthesia Transfer of Care Note  Patient: Alexandria Mclaughlin  Procedure(s) Performed: Right Lumbar four-five Minimally invasive Transforaminal lumbar interbody fusion (Right Spine Lumbar)  Patient Location: PACU  Anesthesia Type:General  Level of Consciousness: awake and patient cooperative  Airway & Oxygen Therapy: Patient Spontanous Breathing and Patient connected to nasal cannula oxygen  Post-op Assessment: Report given to RN, Post -op Vital signs reviewed and stable and Patient moving all extremities  Post vital signs: Reviewed and stable  Last Vitals:  Vitals Value Taken Time  BP 152/79 04/26/2018  4:32 PM  Temp    Pulse 117 04/26/2018  4:34 PM  Resp 15 04/26/2018  4:34 PM  SpO2 100 % 04/26/2018  4:34 PM  Vitals shown include unvalidated device data.  Last Pain:  Vitals:   04/26/18 0823  TempSrc:   PainSc: 5       Patients Stated Pain Goal: 3 (17/51/02 5852)  Complications: No apparent anesthesia complications

## 2018-04-26 NOTE — Anesthesia Postprocedure Evaluation (Signed)
Anesthesia Post Note  Patient: Alexandria Mclaughlin  Procedure(s) Performed: Right Lumbar four-five Minimally invasive Transforaminal lumbar interbody fusion (Right Spine Lumbar)     Patient location during evaluation: PACU Anesthesia Type: General Level of consciousness: awake and alert Pain management: pain level controlled Vital Signs Assessment: post-procedure vital signs reviewed and stable Respiratory status: spontaneous breathing, nonlabored ventilation and respiratory function stable Cardiovascular status: blood pressure returned to baseline and stable Postop Assessment: no apparent nausea or vomiting Anesthetic complications: no    Last Vitals:  Vitals:   04/26/18 1700 04/26/18 1715  BP: (!) 158/78 (!) 138/91  Pulse: (!) 108 (!) 109  Resp: 15 19  Temp:    SpO2: 100% 98%    Last Pain:  Vitals:   04/26/18 1719  TempSrc:   PainSc: 7                  Hennesy Sobalvarro,W. EDMOND

## 2018-04-27 ENCOUNTER — Encounter (HOSPITAL_COMMUNITY): Payer: Self-pay | Admitting: Neurological Surgery

## 2018-04-27 MED ORDER — DIAZEPAM 5 MG PO TABS: 5.0000 mg | ORAL_TABLET | Freq: Four times a day (QID) | ORAL | 0 refills | Status: DC | PRN

## 2018-04-27 MED ORDER — OXYCODONE HCL 10 MG PO TABS: 10.0000 mg | ORAL_TABLET | ORAL | 0 refills | Status: DC | PRN

## 2018-04-27 NOTE — Discharge Instructions (Signed)
Discharge Instructions ° °No restriction in activities, slowly increase your activity back to normal.  ° °Your incision is closed with dermabond (purple glue). This will naturally fall off over the next 1-2 weeks.  ° °Okay to shower on the day of discharge. Use regular soap and water and try to be gentle when cleaning your incision.  ° °Follow up with Dr. Naijah Lacek in 2 weeks after discharge. If you do not already have a discharge appointment, please call his office at [phone] to schedule a follow up appointment. If you have any concerns or questions, please call the office and let us know. °

## 2018-04-27 NOTE — Evaluation (Signed)
Occupational Therapy Evaluation and Discharge Patient Details Name: Alexandria Mclaughlin MRN: [id number] DOB: 01-07-1941 Today's Date: 04/27/2018    History of Present Illness Pt is a 78 y/o female who presents s/p L4-L5 TLIF on 04/26/2018. PMH significant for vision abnormalities (s/p retinal detachment surgery), HTN.   Clinical Impression   This 78 yo female admitted and underwent above presents to acute OT with all education completed, we will D/C from acute OT.    Follow Up Recommendations  Home health OT;Supervision/Assistance - 24 hour    Equipment Recommendations  3 in 1 bedside commode       Precautions / Restrictions Precautions Precautions: Fall;Back Precaution Booklet Issued: Yes (comment) Precaution Comments: Reviewed handout in detail. Pt required cues to recall 3/3 at end of session.  Required Braces or Orthoses: Spinal Brace Spinal Brace: Lumbar corset;Applied in sitting position Restrictions Weight Bearing Restrictions: No      Mobility Bed Mobility Overal bed mobility: Needs Assistance Bed Mobility: Sit to Sidelying Rolling: Supervision     Sit to sidelying: Supervision General bed mobility comments: Supervision for safety. VC's for proper log roll technique.   Transfers Overall transfer level: Needs assistance Equipment used: Rolling walker (2 wheeled) Transfers: Sit to/from Stand Sit to Stand: Min guard         General transfer comment: VCs for sit<>stand stance that makes it easier to keep back straight    Balance Overall balance assessment: Needs assistance Sitting-balance support: Feet supported;No upper extremity supported Sitting balance-Leahy Scale: Fair     Standing balance support: Bilateral upper extremity supported;During functional activity Standing balance-Leahy Scale: Poor Standing balance comment: Reliant on UE support                           ADL either performed or assessed with clinical judgement   ADL Overall ADL's :  Needs assistance/impaired Eating/Feeding: Independent;Sitting   Grooming: Min guard;Standing Grooming Details (indicate cue type and reason): Educated pt on using 2 cups for brushing teeth Upper Body Bathing: Set up;Sitting   Lower Body Bathing: Min guard;Sit to/from stand   Upper Body Dressing : Supervision/safety;Set up;Sitting   Lower Body Dressing: Min guard Lower Body Dressing Details (indicate cue type and reason): using sock aid and putting RLE in first Toilet Transfer: Min guard;Ambulation;RW;BSC Toilet Transfer Details (indicate cue type and reason): over toilet   Toileting - Clothing Manipulation Details (indicate cue type and reason): Educated on use of wet wipes for back peri care       General ADL Comments: Educated pt on sequence of dressing     Vision Patient Visual Report: No change from baseline              Pertinent Vitals/Pain Pain Assessment: 0-10 Pain Score: 3  Faces Pain Scale: Hurts a little bit Pain Location: Incision site Pain Descriptors / Indicators: Operative site guarding;Discomfort Pain Intervention(s): Limited activity within patient's tolerance;Monitored during session;Repositioned     Hand Dominance Right   Extremity/Trunk Assessment Upper Extremity Assessment Upper Extremity Assessment: Overall WFL for tasks assessed   Lower Extremity Assessment Lower Extremity Assessment: Generalized weakness(Consistent with pre-op diagnosis)     Communication Communication Communication: No difficulties   Cognition Arousal/Alertness: Awake/alert Behavior During Therapy: WFL for tasks assessed/performed Overall Cognitive Status: Within Functional Limits for tasks assessed  Home Living Family/patient expects to be discharged to:: Private residence Living Arrangements: Alone Available Help at Discharge: Family;Available 24 hours/day(dtr initially) Type of Home:  Apartment Home Access: Level entry;Elevator     Home Layout: One level     Bathroom Shower/Tub: Other (comment)(walk in tub)   Bathroom Toilet: Handicapped height     Home Equipment: Walker - 2 wheels;Cane - single point;Grab bars - tub/shower;Hand held shower head;Grab bars - toilet;Adaptive equipment Adaptive Equipment: Reacher;Sock aid        Prior Functioning/Environment Level of Independence: Independent                 OT Problem List: Decreased range of motion;Impaired balance (sitting and/or standing);Pain         OT Goals(Current goals can be found in the care plan section) Acute Rehab OT Goals Patient Stated Goal: Home today  OT Frequency:                AM-PAC OT "6 Clicks" Daily Activity     Outcome Measure Help from another person eating meals?: None Help from another person taking care of personal grooming?: A Little Help from another person toileting, which includes using toliet, bedpan, or urinal?: A Little Help from another person bathing (including washing, rinsing, drying)?: A Little Help from another person to put on and taking off regular upper body clothing?: A Little Help from another person to put on and taking off regular lower body clothing?: A Little 6 Click Score: 19   End of Session Equipment Utilized During Treatment: Gait belt;Rolling walker;Back brace Nurse Communication: (need for HHOT and 3n1)  Activity Tolerance: Patient tolerated treatment well Patient left: in bed;with call bell/phone within reach  OT Visit Diagnosis: Unsteadiness on feet (R26.81);Other abnormalities of gait and mobility (R26.89);Pain Pain - part of body: (incisional)                Time: 2878-6767 OT Time Calculation (min): 40 min Charges:  OT General Charges $OT Visit: 1 Visit OT Evaluation $OT Eval Moderate Complexity: 1 Mod OT Treatments $Self Care/Home Management : 23-37 mins  Golden Circle, OTR/L Acute [employer] Pager  [phone] Office [phone]     Almon Register 04/27/2018, 9:57 AM

## 2018-04-27 NOTE — Discharge Summary (Signed)
Discharge Summary  Date of Admission: 04/26/2018  Date of Discharge: 04/27/18  Attending Physician: Emelda Brothers, MD  Hospital Course: Patient was admitted following an uncomplicated Q0-0 TLIF. She was recovered in PACU and transferred to the spine recovery unit. She had some post-op radiculitis that resolved within a few hours that was likely due to nerve root irritation during decompression and manipulation while removing scar tissue from her prior surgery. By POD1, this pain was gone and she felt much better. Her hospital course was uncomplicated and the patient was discharged home on POD1. She will follow up in clinic with me in 2 weeks.  Neurologic exam at discharge:  AOx3, PERRL, EOMI, FS, TM Strength 5/5 x4, SILTx4, no drift  Discharge diagnosis: Lumbar radiculopathy  Judith Part, MD 04/27/18 7:26 AM

## 2018-04-27 NOTE — Care Management Note (Signed)
Case Management Note  Patient Details  Name: Alexandria Mclaughlin MRN: [id number] Date of Birth: [birth year]  Subjective/Objective:  Pt is a 78 y/o female who presents s/p L4-L5 TLIF on 04/26/2018.                  Action/Plan: Case manager spoke with patient concerning discharge plan. Medicare Choice provided for patient. Patient has requested [facility] based on past experience. Referral was called to Glyn Ade, [facility] Liaison.   Expected Discharge Date:  04/27/18               Expected Discharge Plan:  [place]  In-House Referral:     Discharge planning Services  CM Consult  Post Acute Care Choice:  Home Health, Durable Medical Equipment Choice offered to:  Patient  DME Arranged:  3-N-1 DME Agency:  [facility]  HH Arranged:  PT [place]:  [facility]  Status of Service:  Completed, signed off  If discussed at [place] of Stay Meetings, dates discussed:    Additional Comments:  Ninfa Meeker, RN 04/27/2018, 11:12 AM

## 2018-04-27 NOTE — Progress Notes (Signed)
Neurosurgery Service Progress Note  Subjective: No acute events overnight, had some severe radicular pain last night immediately post-op, but it resolved and has been gone since then.   Objective: Vitals:   04/26/18 2011 04/26/18 2301 04/27/18 0359 04/27/18 0622  BP: (!) 150/81 128/66 (!) 90/54 (!) 87/49  Pulse: 99 82 63 (!) 59  Resp: 18 18 18    Temp: 98 F (36.7 C) 98 F (36.7 C) 98 F (36.7 C)   TempSrc: Oral Oral Oral   SpO2: 98% 94% 93%   Weight:      Height:       Temp (24hrs), Avg:97.8 F (36.6 C), Min:97.5 F (36.4 C), Max:98.1 F (36.7 C)  CBC Latest Ref Rng & Units 04/20/2018 09/19/2015 03/02/2009  WBC 4.0 - 10.5 K/uL 6.5 7.1 -  Hemoglobin 12.0 - 15.0 g/dL 13.5 14.2 10.3 POST TRANSFUSION SPECIMEN DELTA CHECK NOTED(L)  Hematocrit 36.0 - 46.0 % 44.8 40.9 30.1(L)  Platelets 150 - 400 K/uL 210 204 -   BMP Latest Ref Rng & Units 04/20/2018 09/19/2015 02/28/2009  Glucose 70 - 99 mg/dL 101(H) 111(H) 119(H)  BUN 8 - 23 mg/dL 21 20 8   Creatinine 0.44 - 1.00 mg/dL 0.49 0.56(L) 0.52  BUN/Creat Ratio 12 - 28 - 36(H) -  Sodium 135 - 145 mmol/L 141 139 138  Potassium 3.5 - 5.1 mmol/L 4.1 4.6 3.6  Chloride 98 - 111 mmol/L 109 101 103  CO2 22 - 32 mmol/L 26 28 31   Calcium 8.9 - 10.3 mg/dL 9.1 9.9 8.2(L)    Intake/Output Summary (Last 24 hours) at 04/27/2018 0725 Last data filed at 04/26/2018 2030 Gross per 24 hour  Intake 2037 ml  Output 850 ml  Net 1187 ml    Current Facility-Administered Medications:  .  acetaminophen (TYLENOL) tablet 650 mg, 650 mg, Oral, Q4H PRN, 650 mg at 04/26/18 2101 **OR** acetaminophen (TYLENOL) suppository 650 mg, 650 mg, Rectal, Q4H PRN, Judith Part, MD .  clonazePAM (KLONOPIN) tablet 0.5 mg, 0.5 mg, Oral, QHS, Jarius Dieudonne A, MD, 0.5 mg at 04/26/18 2101 .  diazepam (VALIUM) tablet 5 mg, 5 mg, Oral, Q6H PRN, Judith Part, MD, 5 mg at 04/26/18 1721 .  docusate sodium (COLACE) capsule 100 mg, 100 mg, Oral, BID, Judith Part, MD,  100 mg at 04/26/18 2101 .  DULoxetine (CYMBALTA) DR capsule 30 mg, 30 mg, Oral, Daily, Elvera Almario, Joyice Faster, MD .  Derrill Memo ON 04/28/2018] fentaNYL (DURAGESIC) 25 MCG/HR 1 patch, 1 patch, Transdermal, Q72H, Magdalynn Davilla A, MD .  gabapentin (NEURONTIN) tablet 600 mg, 600 mg, Oral, QID, Judith Part, MD, 600 mg at 04/26/18 2101 .  HYDROmorphone (DILAUDID) injection 1 mg, 1 mg, Intravenous, Q3H PRN, Judith Part, MD .  lactated ringers infusion, , Intravenous, Continuous, Judith Part, MD, Last Rate: 75 mL/hr at 04/26/18 1838 .  Melatonin TABS 4.5 mg, 4.5 mg, Oral, QHS PRN, Cobey Raineri, Joyice Faster, MD .  menthol-cetylpyridinium (CEPACOL) lozenge 3 mg, 1 lozenge, Oral, PRN **OR** phenol (CHLORASEPTIC) mouth spray 1 spray, 1 spray, Mouth/Throat, PRN, [address], Joyice Faster, MD .  multivitamin with minerals tablet 1 tablet, 1 tablet, Oral, Daily, Jaide Hillenburg A, MD .  ondansetron (ZOFRAN) tablet 4 mg, 4 mg, Oral, Q6H PRN **OR** ondansetron (ZOFRAN) injection 4 mg, 4 mg, Intravenous, Q6H PRN, Kiril Hippe A, MD .  oxyCODONE (Oxy IR/ROXICODONE) immediate release tablet 10 mg, 10 mg, Oral, Q4H PRN, Judith Part, MD, 10 mg at 04/27/18 0110 .  oxyCODONE (Oxy  IR/ROXICODONE) immediate release tablet 5 mg, 5 mg, Oral, Q4H PRN, Raygen Linquist A, MD .  polyethylene glycol (MIRALAX / GLYCOLAX) packet 17 g, 17 g, Oral, Daily PRN, Judith Part, MD .  quinapril (ACCUPRIL) tablet 20 mg, 20 mg, Oral, Daily, [place] Shannahan A, MD .  rosuvastatin (CRESTOR) tablet 20 mg, 20 mg, Oral, Daily, Tykera Skates, Joyice Faster, MD, 20 mg at 04/26/18 1834 .  traZODone (DESYREL) tablet 100 mg, 100 mg, Oral, QHS, Naudia Crosley, Joyice Faster, MD, 100 mg at 04/26/18 2321   Physical Exam: AOx3, PERRL, EOMI, FS, Strength 5/5 x4, SILTx4 Incisions c/d/i x2  Assessment & Plan: 78 y.o. woman s/p MIS L4-5 TLIF, recovering well. -discharge home this morning  Judith Part  04/27/18 7:25 AM

## 2018-04-27 NOTE — Evaluation (Signed)
Physical Therapy Evaluation Patient Details Name: Alexandria Mclaughlin MRN: [id number] DOB: 1940-03-20 Today's Date: 04/27/2018   History of Present Illness  Pt is a 78 y/o female who presents s/p L4-L5 TLIF on 04/26/2018. PMH significant for vision abnormalities (s/p retinal detachment surgery), HTN.  Clinical Impression  Pt admitted with above diagnosis. Pt currently with functional limitations due to the deficits listed below (see PT Problem List). At the time of PT eval pt was able to perform transfers and ambulation with gross min guard assist to supervision for safety. Pt was educated on precautions, car transfer, activity progression and positioning recommendations. Pt will benefit from skilled PT to increase their independence and safety with mobility to allow discharge to the venue listed below.       Follow Up Recommendations Home health PT;Supervision for mobility/OOB    Equipment Recommendations  None recommended by PT    Recommendations for Other Services       Precautions / Restrictions Precautions Precautions: Fall;Back Precaution Booklet Issued: Yes (comment) Precaution Comments: Reviewed handout in detail. Pt required cues to recall 3/3 at end of session.  Required Braces or Orthoses: Spinal Brace Spinal Brace: Lumbar corset;Applied in sitting position Restrictions Weight Bearing Restrictions: No      Mobility  Bed Mobility Overal bed mobility: Needs Assistance Bed Mobility: Rolling;Sidelying to Sit Rolling: Supervision Sidelying to sit: Supervision       General bed mobility comments: Supervision for safety. VC's for proper log roll technique.   Transfers Overall transfer level: Needs assistance Equipment used: Rolling walker (2 wheeled) Transfers: Sit to/from Stand Sit to Stand: Supervision         General transfer comment: Close supervision for safety by end of session. VC's to maintain upright posture.  Ambulation/Gait Ambulation/Gait assistance: Min  guard Gait Distance (Feet): 200 Feet Assistive device: Rolling walker (2 wheeled) Gait Pattern/deviations: Step-through pattern;Decreased stride length;Trunk flexed Gait velocity: Decreased Gait velocity interpretation: 1.31 - 2.62 ft/sec, indicative of limited community ambulator General Gait Details: VC's for improved posture throughout. Pt had difficulty maintaining corrective changes.   Stairs            Wheelchair Mobility    Modified Rankin (Stroke Patients Only)       Balance Overall balance assessment: Needs assistance Sitting-balance support: Feet supported;No upper extremity supported Sitting balance-Leahy Scale: Fair     Standing balance support: Bilateral upper extremity supported;During functional activity Standing balance-Leahy Scale: Poor Standing balance comment: Reliant on UE support                             Pertinent Vitals/Pain Pain Assessment: Faces Faces Pain Scale: Hurts a little bit Pain Location: Incision site Pain Descriptors / Indicators: Operative site guarding;Discomfort Pain Intervention(s): Limited activity within patient's tolerance;Monitored during session;Repositioned    Home Living Family/patient expects to be discharged to:: Private residence Living Arrangements: Alone Available Help at Discharge: Family;Available 24 hours/day(Daughter initially) Type of Home: Apartment Home Access: Level entry;Elevator     Home Layout: One level Home Equipment: Walker - 2 wheels;Cane - single point      Prior Function Level of Independence: Independent               Hand Dominance        Extremity/Trunk Assessment   Upper Extremity Assessment Upper Extremity Assessment: Defer to OT evaluation    Lower Extremity Assessment Lower Extremity Assessment: Generalized weakness(Consistent with pre-op diagnosis)    Cervical /  Trunk Assessment Cervical / Trunk Assessment: Kyphotic;Other exceptions Cervical / Trunk  Exceptions: Forward head posture with rounded shoulders  Communication   Communication: No difficulties  Cognition Arousal/Alertness: Awake/alert Behavior During Therapy: WFL for tasks assessed/performed Overall Cognitive Status: Within Functional Limits for tasks assessed                                        General Comments      Exercises     Assessment/Plan    PT Assessment Patient needs continued PT services  PT Problem List Decreased strength;Decreased activity tolerance;Decreased balance;Decreased mobility;Decreased knowledge of use of DME;Decreased safety awareness;Decreased knowledge of precautions;Pain       PT Treatment Interventions DME instruction;Gait training;Functional mobility training;Therapeutic activities;Therapeutic exercise;Neuromuscular re-education;Patient/family education    PT Goals (Current goals can be found in the Care Plan section)  Acute Rehab PT Goals Patient Stated Goal: Home today PT Goal Formulation: With patient Time For Goal Achievement: 05/04/18 Potential to Achieve Goals: Good    Frequency Min 5X/week   Barriers to discharge        Co-evaluation               AM-PAC PT "6 Clicks" Mobility  Outcome Measure Help needed turning from your back to your side while in a flat bed without using bedrails?: None Help needed moving from lying on your back to sitting on the side of a flat bed without using bedrails?: A Little Help needed moving to and from a bed to a chair (including a wheelchair)?: A Little Help needed standing up from a chair using your arms (e.g., wheelchair or bedside chair)?: A Little Help needed to walk in hospital room?: A Little Help needed climbing 3-5 steps with a railing? : A Little 6 Click Score: 19    End of Session Equipment Utilized During Treatment: Gait belt;Back brace Activity Tolerance: Patient tolerated treatment well Patient left: in chair;with call bell/phone within reach Nurse  Communication: Mobility status PT Visit Diagnosis: Unsteadiness on feet (R26.81);Pain;Muscle weakness (generalized) (M62.81) Pain - part of body: (back)    Time: 5537-4827 PT Time Calculation (min) (ACUTE ONLY): 23 min   Charges:   PT Evaluation $PT Eval Moderate Complexity: 1 Mod PT Treatments $Gait Training: 8-22 mins        Rolinda Roan, PT, DPT Acute Rehabilitation Services Pager: [phone] Office: [phone]   Thelma Comp 04/27/2018, 8:42 AM

## 2018-04-27 NOTE — Progress Notes (Signed)
Patient alert and oriented, mae's well, voiding adequate amount of urine, swallowing without difficulty, no c/o pain at time of discharge. Patient discharged home with family. Script and discharged instructions given to patient. Patient and family stated understanding of instructions given. Patient has an appointment with Dr. Ostergard   

## 2018-04-29 DIAGNOSIS — Z981 Arthrodesis status: Secondary | ICD-10-CM | POA: Diagnosis not present

## 2018-04-29 DIAGNOSIS — R69 Illness, unspecified: Secondary | ICD-10-CM | POA: Diagnosis not present

## 2018-04-29 DIAGNOSIS — Z4789 Encounter for other orthopedic aftercare: Secondary | ICD-10-CM | POA: Diagnosis not present

## 2018-04-29 DIAGNOSIS — K219 Gastro-esophageal reflux disease without esophagitis: Secondary | ICD-10-CM | POA: Diagnosis not present

## 2018-04-29 DIAGNOSIS — Z9181 History of falling: Secondary | ICD-10-CM | POA: Diagnosis not present

## 2018-04-29 DIAGNOSIS — Z87891 Personal history of nicotine dependence: Secondary | ICD-10-CM | POA: Diagnosis not present

## 2018-04-29 DIAGNOSIS — M4716 Other spondylosis with myelopathy, lumbar region: Secondary | ICD-10-CM | POA: Diagnosis not present

## 2018-04-29 DIAGNOSIS — I10 Essential (primary) hypertension: Secondary | ICD-10-CM | POA: Diagnosis not present

## 2018-04-29 DIAGNOSIS — E785 Hyperlipidemia, unspecified: Secondary | ICD-10-CM | POA: Diagnosis not present

## 2018-05-03 DIAGNOSIS — I10 Essential (primary) hypertension: Secondary | ICD-10-CM | POA: Diagnosis not present

## 2018-05-03 DIAGNOSIS — Z4789 Encounter for other orthopedic aftercare: Secondary | ICD-10-CM | POA: Diagnosis not present

## 2018-05-03 DIAGNOSIS — Z9181 History of falling: Secondary | ICD-10-CM | POA: Diagnosis not present

## 2018-05-03 DIAGNOSIS — E785 Hyperlipidemia, unspecified: Secondary | ICD-10-CM | POA: Diagnosis not present

## 2018-05-03 DIAGNOSIS — Z87891 Personal history of nicotine dependence: Secondary | ICD-10-CM | POA: Diagnosis not present

## 2018-05-03 DIAGNOSIS — M4716 Other spondylosis with myelopathy, lumbar region: Secondary | ICD-10-CM | POA: Diagnosis not present

## 2018-05-03 DIAGNOSIS — Z981 Arthrodesis status: Secondary | ICD-10-CM | POA: Diagnosis not present

## 2018-05-03 DIAGNOSIS — K219 Gastro-esophageal reflux disease without esophagitis: Secondary | ICD-10-CM | POA: Diagnosis not present

## 2018-05-03 DIAGNOSIS — R69 Illness, unspecified: Secondary | ICD-10-CM | POA: Diagnosis not present

## 2018-05-05 ENCOUNTER — Other Ambulatory Visit: Payer: Self-pay | Admitting: Physical Medicine & Rehabilitation

## 2018-05-05 DIAGNOSIS — M4716 Other spondylosis with myelopathy, lumbar region: Secondary | ICD-10-CM | POA: Diagnosis not present

## 2018-05-05 DIAGNOSIS — E785 Hyperlipidemia, unspecified: Secondary | ICD-10-CM | POA: Diagnosis not present

## 2018-05-05 DIAGNOSIS — Z9181 History of falling: Secondary | ICD-10-CM | POA: Diagnosis not present

## 2018-05-05 DIAGNOSIS — Z87891 Personal history of nicotine dependence: Secondary | ICD-10-CM | POA: Diagnosis not present

## 2018-05-05 DIAGNOSIS — R69 Illness, unspecified: Secondary | ICD-10-CM | POA: Diagnosis not present

## 2018-05-05 DIAGNOSIS — Z981 Arthrodesis status: Secondary | ICD-10-CM | POA: Diagnosis not present

## 2018-05-05 DIAGNOSIS — I10 Essential (primary) hypertension: Secondary | ICD-10-CM | POA: Diagnosis not present

## 2018-05-05 DIAGNOSIS — Z4789 Encounter for other orthopedic aftercare: Secondary | ICD-10-CM | POA: Diagnosis not present

## 2018-05-05 DIAGNOSIS — K219 Gastro-esophageal reflux disease without esophagitis: Secondary | ICD-10-CM | POA: Diagnosis not present

## 2018-05-07 DIAGNOSIS — I10 Essential (primary) hypertension: Secondary | ICD-10-CM | POA: Diagnosis not present

## 2018-05-07 DIAGNOSIS — E785 Hyperlipidemia, unspecified: Secondary | ICD-10-CM | POA: Diagnosis not present

## 2018-05-07 DIAGNOSIS — M4716 Other spondylosis with myelopathy, lumbar region: Secondary | ICD-10-CM | POA: Diagnosis not present

## 2018-05-07 DIAGNOSIS — K219 Gastro-esophageal reflux disease without esophagitis: Secondary | ICD-10-CM | POA: Diagnosis not present

## 2018-05-07 DIAGNOSIS — Z4789 Encounter for other orthopedic aftercare: Secondary | ICD-10-CM | POA: Diagnosis not present

## 2018-05-07 DIAGNOSIS — Z9181 History of falling: Secondary | ICD-10-CM | POA: Diagnosis not present

## 2018-05-07 DIAGNOSIS — Z981 Arthrodesis status: Secondary | ICD-10-CM | POA: Diagnosis not present

## 2018-05-07 DIAGNOSIS — R69 Illness, unspecified: Secondary | ICD-10-CM | POA: Diagnosis not present

## 2018-05-07 DIAGNOSIS — Z87891 Personal history of nicotine dependence: Secondary | ICD-10-CM | POA: Diagnosis not present

## 2018-05-12 DIAGNOSIS — Z981 Arthrodesis status: Secondary | ICD-10-CM | POA: Diagnosis not present

## 2018-05-12 DIAGNOSIS — Z4789 Encounter for other orthopedic aftercare: Secondary | ICD-10-CM | POA: Diagnosis not present

## 2018-05-12 DIAGNOSIS — Z9181 History of falling: Secondary | ICD-10-CM | POA: Diagnosis not present

## 2018-05-12 DIAGNOSIS — K219 Gastro-esophageal reflux disease without esophagitis: Secondary | ICD-10-CM | POA: Diagnosis not present

## 2018-05-12 DIAGNOSIS — M4716 Other spondylosis with myelopathy, lumbar region: Secondary | ICD-10-CM | POA: Diagnosis not present

## 2018-05-12 DIAGNOSIS — R69 Illness, unspecified: Secondary | ICD-10-CM | POA: Diagnosis not present

## 2018-05-12 DIAGNOSIS — Z87891 Personal history of nicotine dependence: Secondary | ICD-10-CM | POA: Diagnosis not present

## 2018-05-12 DIAGNOSIS — I10 Essential (primary) hypertension: Secondary | ICD-10-CM | POA: Diagnosis not present

## 2018-05-12 DIAGNOSIS — E785 Hyperlipidemia, unspecified: Secondary | ICD-10-CM | POA: Diagnosis not present

## 2018-05-13 DIAGNOSIS — I10 Essential (primary) hypertension: Secondary | ICD-10-CM | POA: Diagnosis not present

## 2018-05-13 DIAGNOSIS — M5416 Radiculopathy, lumbar region: Secondary | ICD-10-CM | POA: Diagnosis not present

## 2018-05-19 DIAGNOSIS — M4716 Other spondylosis with myelopathy, lumbar region: Secondary | ICD-10-CM | POA: Diagnosis not present

## 2018-05-19 DIAGNOSIS — Z981 Arthrodesis status: Secondary | ICD-10-CM | POA: Diagnosis not present

## 2018-05-19 DIAGNOSIS — Z4789 Encounter for other orthopedic aftercare: Secondary | ICD-10-CM | POA: Diagnosis not present

## 2018-05-19 DIAGNOSIS — Z9181 History of falling: Secondary | ICD-10-CM | POA: Diagnosis not present

## 2018-05-19 DIAGNOSIS — K219 Gastro-esophageal reflux disease without esophagitis: Secondary | ICD-10-CM | POA: Diagnosis not present

## 2018-05-19 DIAGNOSIS — E785 Hyperlipidemia, unspecified: Secondary | ICD-10-CM | POA: Diagnosis not present

## 2018-05-19 DIAGNOSIS — R69 Illness, unspecified: Secondary | ICD-10-CM | POA: Diagnosis not present

## 2018-05-19 DIAGNOSIS — Z87891 Personal history of nicotine dependence: Secondary | ICD-10-CM | POA: Diagnosis not present

## 2018-05-19 DIAGNOSIS — I10 Essential (primary) hypertension: Secondary | ICD-10-CM | POA: Diagnosis not present

## 2018-05-20 ENCOUNTER — Other Ambulatory Visit: Payer: Self-pay | Admitting: Internal Medicine

## 2018-05-20 DIAGNOSIS — E041 Nontoxic single thyroid nodule: Secondary | ICD-10-CM

## 2018-05-21 DIAGNOSIS — R69 Illness, unspecified: Secondary | ICD-10-CM | POA: Diagnosis not present

## 2018-05-21 DIAGNOSIS — Z4789 Encounter for other orthopedic aftercare: Secondary | ICD-10-CM | POA: Diagnosis not present

## 2018-05-21 DIAGNOSIS — E785 Hyperlipidemia, unspecified: Secondary | ICD-10-CM | POA: Diagnosis not present

## 2018-05-21 DIAGNOSIS — Z981 Arthrodesis status: Secondary | ICD-10-CM | POA: Diagnosis not present

## 2018-05-21 DIAGNOSIS — Z9181 History of falling: Secondary | ICD-10-CM | POA: Diagnosis not present

## 2018-05-21 DIAGNOSIS — K219 Gastro-esophageal reflux disease without esophagitis: Secondary | ICD-10-CM | POA: Diagnosis not present

## 2018-05-21 DIAGNOSIS — M4716 Other spondylosis with myelopathy, lumbar region: Secondary | ICD-10-CM | POA: Diagnosis not present

## 2018-05-21 DIAGNOSIS — Z87891 Personal history of nicotine dependence: Secondary | ICD-10-CM | POA: Diagnosis not present

## 2018-05-21 DIAGNOSIS — I10 Essential (primary) hypertension: Secondary | ICD-10-CM | POA: Diagnosis not present

## 2018-05-25 DIAGNOSIS — Z981 Arthrodesis status: Secondary | ICD-10-CM | POA: Diagnosis not present

## 2018-05-25 DIAGNOSIS — R69 Illness, unspecified: Secondary | ICD-10-CM | POA: Diagnosis not present

## 2018-05-25 DIAGNOSIS — Z4789 Encounter for other orthopedic aftercare: Secondary | ICD-10-CM | POA: Diagnosis not present

## 2018-05-25 DIAGNOSIS — Z87891 Personal history of nicotine dependence: Secondary | ICD-10-CM | POA: Diagnosis not present

## 2018-05-25 DIAGNOSIS — E785 Hyperlipidemia, unspecified: Secondary | ICD-10-CM | POA: Diagnosis not present

## 2018-05-25 DIAGNOSIS — K219 Gastro-esophageal reflux disease without esophagitis: Secondary | ICD-10-CM | POA: Diagnosis not present

## 2018-05-25 DIAGNOSIS — M4716 Other spondylosis with myelopathy, lumbar region: Secondary | ICD-10-CM | POA: Diagnosis not present

## 2018-05-25 DIAGNOSIS — Z9181 History of falling: Secondary | ICD-10-CM | POA: Diagnosis not present

## 2018-05-25 DIAGNOSIS — I10 Essential (primary) hypertension: Secondary | ICD-10-CM | POA: Diagnosis not present

## 2018-05-27 DIAGNOSIS — Z981 Arthrodesis status: Secondary | ICD-10-CM | POA: Diagnosis not present

## 2018-05-27 DIAGNOSIS — K219 Gastro-esophageal reflux disease without esophagitis: Secondary | ICD-10-CM | POA: Diagnosis not present

## 2018-05-27 DIAGNOSIS — Z4789 Encounter for other orthopedic aftercare: Secondary | ICD-10-CM | POA: Diagnosis not present

## 2018-05-27 DIAGNOSIS — Z87891 Personal history of nicotine dependence: Secondary | ICD-10-CM | POA: Diagnosis not present

## 2018-05-27 DIAGNOSIS — E785 Hyperlipidemia, unspecified: Secondary | ICD-10-CM | POA: Diagnosis not present

## 2018-05-27 DIAGNOSIS — Z9181 History of falling: Secondary | ICD-10-CM | POA: Diagnosis not present

## 2018-05-27 DIAGNOSIS — I10 Essential (primary) hypertension: Secondary | ICD-10-CM | POA: Diagnosis not present

## 2018-05-27 DIAGNOSIS — R69 Illness, unspecified: Secondary | ICD-10-CM | POA: Diagnosis not present

## 2018-05-27 DIAGNOSIS — M4716 Other spondylosis with myelopathy, lumbar region: Secondary | ICD-10-CM | POA: Diagnosis not present

## 2018-06-01 ENCOUNTER — Telehealth: Payer: Self-pay | Admitting: *Deleted

## 2018-06-01 MED ORDER — OXYCODONE-ACETAMINOPHEN 10-325 MG PO TABS: 1.0000 | ORAL_TABLET | Freq: Three times a day (TID) | ORAL | 0 refills | Status: DC | PRN

## 2018-06-01 NOTE — Telephone Encounter (Signed)
PMP was reviewed, Oxycodone e-scribed. This provider called Ms. Pasillas she is aware of the above and verbalizes understanding.

## 2018-06-01 NOTE — Telephone Encounter (Signed)
Vola has an appt on 06/08/18 but will be out of her oxycodone acetaminophen before then.  Please refill and let her know. Last fill date 05/02/18 #90.

## 2018-06-02 ENCOUNTER — Telehealth: Payer: Self-pay | Admitting: *Deleted

## 2018-06-02 NOTE — Telephone Encounter (Signed)
Patient states she is really needing a shot for her left shoulder pain. She knows she has an upcoming virtual visit but, she states that she really needs an injection. She asks for a call back

## 2018-06-07 NOTE — Telephone Encounter (Signed)
We can make her upcoming visit an in-person procedural visit

## 2018-06-08 ENCOUNTER — Encounter: Payer: Self-pay | Admitting: Physical Medicine & Rehabilitation

## 2018-06-08 ENCOUNTER — Other Ambulatory Visit: Payer: Self-pay

## 2018-06-08 ENCOUNTER — Ambulatory Visit
Admission: RE | Admit: 2018-06-08 | Discharge: 2018-06-08 | Disposition: A | Discharge status: Home / Self Care | Payer: Medicare HMO | Source: Ambulatory Visit | Attending: Physical Medicine & Rehabilitation | Admitting: Physical Medicine & Rehabilitation

## 2018-06-08 ENCOUNTER — Encounter: Payer: Medicare HMO | Attending: Physical Medicine and Rehabilitation | Admitting: Physical Medicine & Rehabilitation

## 2018-06-08 VITALS — BP 157/84 | HR 68 | Ht 63.0 in | Wt 132.0 lb

## 2018-06-08 DIAGNOSIS — M47816 Spondylosis without myelopathy or radiculopathy, lumbar region: Secondary | ICD-10-CM

## 2018-06-08 DIAGNOSIS — M533 Sacrococcygeal disorders, not elsewhere classified: Secondary | ICD-10-CM | POA: Insufficient documentation

## 2018-06-08 DIAGNOSIS — M2170 Unequal limb length (acquired), unspecified site: Secondary | ICD-10-CM | POA: Insufficient documentation

## 2018-06-08 DIAGNOSIS — M5416 Radiculopathy, lumbar region: Secondary | ICD-10-CM

## 2018-06-08 DIAGNOSIS — M7582 Other shoulder lesions, left shoulder: Secondary | ICD-10-CM | POA: Insufficient documentation

## 2018-06-08 DIAGNOSIS — M961 Postlaminectomy syndrome, not elsewhere classified: Secondary | ICD-10-CM

## 2018-06-08 DIAGNOSIS — M25512 Pain in left shoulder: Secondary | ICD-10-CM | POA: Diagnosis not present

## 2018-06-08 HISTORY — DX: Other shoulder lesions, left shoulder: M75.82

## 2018-06-08 MED ORDER — FENTANYL 25 MCG/HR TD PT72: 1.0000 | MEDICATED_PATCH | TRANSDERMAL | 0 refills | Status: DC

## 2018-06-08 NOTE — Patient Instructions (Signed)
PLEASE FEEL FREE TO CALL OUR OFFICE WITH ANY PROBLEMS OR QUESTIONS ([phone])      

## 2018-06-08 NOTE — Progress Notes (Signed)
PROCEDURE NOTE  DIAGNOSIS: left rotator cuff tendonitis  INTERVENTION:  Major joint injection     After informed consent and preparation of the skin with betadine and isopropyl alcohol, I injected 6mg  (1cc) of celestone and 4cc of 1% lidocaine into the left subacromial space via lateral approach. Additionally, aspiration was performed prior to injection. The patient tolerated well, and no complications were encountered. Afterward the area was cleaned and dressed. Post- injection instructions were provided.    Ordered XR left shoulder to assess for degenerative disease, jt spacing  Meredith Staggers, MD, [facility] 06/08/2018

## 2018-07-05 DIAGNOSIS — G4700 Insomnia, unspecified: Secondary | ICD-10-CM | POA: Diagnosis not present

## 2018-07-05 DIAGNOSIS — Z604 Social exclusion and rejection: Secondary | ICD-10-CM | POA: Diagnosis not present

## 2018-07-05 DIAGNOSIS — F419 Anxiety disorder, unspecified: Secondary | ICD-10-CM | POA: Diagnosis not present

## 2018-07-05 DIAGNOSIS — E785 Hyperlipidemia, unspecified: Secondary | ICD-10-CM | POA: Diagnosis not present

## 2018-07-05 DIAGNOSIS — I10 Essential (primary) hypertension: Secondary | ICD-10-CM | POA: Diagnosis not present

## 2018-07-05 DIAGNOSIS — M2550 Pain in unspecified joint: Secondary | ICD-10-CM | POA: Diagnosis not present

## 2018-07-05 DIAGNOSIS — K219 Gastro-esophageal reflux disease without esophagitis: Secondary | ICD-10-CM | POA: Diagnosis not present

## 2018-07-05 DIAGNOSIS — R69 Illness, unspecified: Secondary | ICD-10-CM | POA: Diagnosis not present

## 2018-07-05 DIAGNOSIS — K5900 Constipation, unspecified: Secondary | ICD-10-CM | POA: Diagnosis not present

## 2018-07-05 DIAGNOSIS — G8929 Other chronic pain: Secondary | ICD-10-CM | POA: Diagnosis not present

## 2018-07-07 ENCOUNTER — Encounter: Payer: Self-pay | Admitting: Physical Medicine & Rehabilitation

## 2018-07-07 ENCOUNTER — Other Ambulatory Visit: Payer: Self-pay

## 2018-07-07 ENCOUNTER — Encounter: Payer: Medicare HMO | Attending: Physical Medicine and Rehabilitation | Admitting: Physical Medicine & Rehabilitation

## 2018-07-07 DIAGNOSIS — M2170 Unequal limb length (acquired), unspecified site: Secondary | ICD-10-CM | POA: Insufficient documentation

## 2018-07-07 DIAGNOSIS — M961 Postlaminectomy syndrome, not elsewhere classified: Secondary | ICD-10-CM

## 2018-07-07 DIAGNOSIS — M533 Sacrococcygeal disorders, not elsewhere classified: Secondary | ICD-10-CM | POA: Diagnosis not present

## 2018-07-07 DIAGNOSIS — M7522 Bicipital tendinitis, left shoulder: Secondary | ICD-10-CM | POA: Diagnosis not present

## 2018-07-07 DIAGNOSIS — M47816 Spondylosis without myelopathy or radiculopathy, lumbar region: Secondary | ICD-10-CM | POA: Diagnosis not present

## 2018-07-07 DIAGNOSIS — M5416 Radiculopathy, lumbar region: Secondary | ICD-10-CM | POA: Diagnosis not present

## 2018-07-07 MED ORDER — OXYCODONE-ACETAMINOPHEN 10-325 MG PO TABS: 1.0000 | ORAL_TABLET | Freq: Three times a day (TID) | ORAL | 0 refills | Status: DC | PRN

## 2018-07-07 MED ORDER — FENTANYL 25 MCG/HR TD PT72: 1.0000 | MEDICATED_PATCH | TRANSDERMAL | 0 refills | Status: DC

## 2018-07-07 NOTE — Progress Notes (Signed)
Subjective:    Patient ID: Alexandria Mclaughlin, female    DOB: [birth year], 78 y.o.   MRN: [id number]  HPI   Alexandria Mclaughlin is back regarding her chronic pain.  She had a L4-L5 fusion in March by neurosurgery which has helped her back and leg pain tremendously.  However she still has some right leg and right ankle weakness.  Over the last week or 2 she has had increased ankle swelling and is concerned that she might have gout.  She is using a cane for ambulation most of the time although she does have a walker.  In addition to her ankle she is having increased left shoulder pain.  Her right shoulder is feeling better.  She typically uses her right arm to hold the cane.  She remains on oxycodone and fentanyl for pain control.  She received some home health therapy initially but has not had that for some time now.  She is doing some of her home exercises at home including the shoulder exercises I have provided her previously.  Pain Inventory Average Pain 4 Pain Right Now 4 My pain is intermittent, sharp and aching  In the last 24 hours, has pain interfered with the following? General activity 7 Relation with others 6 Enjoyment of life 6 What TIME of day is your pain at its worst? daytime Sleep (in general) Fair  Pain is worse with: walking and standing Pain improves with: heat/ice, medication and injections Relief from Meds: 5  Mobility walk without assistance ability to climb steps?  no do you drive?  yes  Function Do you have any goals in this area?  yes  Neuro/Psych numbness trouble walking depression  Prior Studies Any changes since last visit?  no  Physicians involved in your care Any changes since last visit?  no   Family History  Problem Relation Age of Onset  . Diabetes Mother   . Heart disease Mother   . Stroke Mother   . Heart disease Father   . Heart disease Brother    Social History   Socioeconomic History  . Marital status: Widowed    Spouse name: Not on file  .  Number of children: Not on file  . Years of education: Not on file  . Highest education level: Not on file  Occupational History  . Not on file  Social Needs  . Financial resource strain: Not on file  . Food insecurity:    Worry: Not on file    Inability: Not on file  . Transportation needs:    Medical: Not on file    Non-medical: Not on file  Tobacco Use  . Smoking status: Former Smoker    Packs/day: 0.50    Years: 5.00    Pack years: 2.50    Types: Cigarettes    Last attempt to quit: 03/15/1968    Years since quitting: 50.3  . Smokeless tobacco: Never Used  Substance and Sexual Activity  . Alcohol use: Not Currently    Alcohol/week: 1.0 standard drinks    Types: 1 Glasses of wine per week    Comment: occasional glass of wine  . Drug use: No  . Sexual activity: Not on file  Lifestyle  . Physical activity:    Days per week: Not on file    Minutes per session: Not on file  . Stress: Not on file  Relationships  . Social connections:    Talks on phone: Not on file    Gets together:  Not on file    Attends religious service: Not on file    Active member of club or organization: Not on file    Attends meetings of clubs or organizations: Not on file    Relationship status: Not on file  Other Topics Concern  . Not on file  Social History Narrative  . Not on file   Past Surgical History:  Procedure Laterality Date  . ABDOMINAL HYSTERECTOMY    . BACK SURGERY     2011  . BLADDER SURGERY  1985  . CESAREAN SECTION     x2  . RETINAL DETACHMENT SURGERY  9/13  . TRANSFORAMINAL LUMBAR INTERBODY FUSION W/ MIS 1 LEVEL Right 04/26/2018   Procedure: Right Lumbar four-five Minimally invasive Transforaminal lumbar interbody fusion;  Surgeon: Judith Part, MD;  Location: [place];  Service: Neurosurgery;  Laterality: Right;   Past Medical History:  Diagnosis Date  . Cervical facet syndrome   . Depression   . Disorders of sacrum   . Enthesopathy of hip region   . GERD  (gastroesophageal reflux disease)   . Headache   . History of kidney stones   . Hypertension   . Sciatic neuritis   . Synovitis and tenosynovitis   . Vision abnormalities    Pulse 87   Ht 5\' 3"  (1.6 m)   Wt 130 lb (59 kg)   BMI 23.03 kg/m   Opioid Risk Score:   Fall Risk Score:  `1  Depression screen PHQ 2/9  Depression screen [facility] 2/9 01/25/2018 10/12/2017 06/03/2017 04/08/2017 10/22/2015 05/23/2015 03/20/2015  Decreased Interest 0 0 0 0 0 0 0  Down, Depressed, Hopeless 0 0 0 0 0 0 0  PHQ - 2 Score 0 0 0 0 0 0 0  Altered sleeping - - 0 - - 0 -  Tired, decreased energy - - 0 - - 3 -  Change in appetite - - 0 - - 3 -  Feeling bad or failure about yourself  - - 0 - - 0 -  Trouble concentrating - - 0 - - 0 -  Moving slowly or fidgety/restless - - 0 - - 0 -  Suicidal thoughts - - 0 - - 0 -  PHQ-9 Score - - 0 - - 6 -  Some recent data might be hidden     Review of Systems  Constitutional: Negative.   HENT: Negative.   Eyes: Negative.   Respiratory: Negative.   Cardiovascular: Negative.   Gastrointestinal: Negative.   Endocrine: Negative.   Genitourinary: Negative.   Musculoskeletal: Positive for arthralgias, back pain, gait problem and myalgias.  Skin: Negative.   Allergic/Immunologic: Negative.   Neurological: Positive for numbness.  Hematological: Negative.   Psychiatric/Behavioral: Positive for dysphoric mood.  All other systems reviewed and are negative.      Objective:   Physical Exam  General: No acute distress HEENT: EOMI, oral membranes moist Cards: reg rate  Chest: normal effort Abdomen: Soft, NT, ND Skin: dry, intact Extremities: no edema  Neurological:she is alert and appropriate.strength 5/5UE, 3-4/5 RLE and 4/5 LLE. DTr's 1+ to trace in LE's. favors right foot/leg during gait, tends to dropr down into that side. Sensory loss right foot.  Musc:left shoulder bicipital tendon pain short head, speeds +.  Psychiatric:pleasant .          1.  Chronic low back pain with lumbar postlaminectomy syndrome.  Recent fall with new L1 compression fracture.:  -had good results with her L4-5 fusion/decompression/scar tissue  removal by NS -continue fentanyl patch19mcgevery 72 hours, #10. ContinuePercocet 10/325 #75.   -We will continue the controlled substance monitoring program, this consists of regular clinic visits, examinations, routine drug screening, pill counts as well as use of [place] Controlled Substance Reporting System. [place] today.     2. Neuropathic Pain/right sided lumbar radicular pain, L4---improved after surgery   -some residual numbness in the leg.   -Continuegabapentin to 600mg  QID. -continue HEP as possible,might benefit from formal physical therapy right sided ankle pain likely due to weakness and sensory changes in right foot which are persistent postoperatively.   -ASO, use RW for now as oppposed to cane               -consider outpt therapies 3. Gait Disorder: Continue HEP /Neurology Following -RW for now  4. Myoclonus with sleep: Continue Klonopin 5.Lumbar facet arthropathy: had previous results with MBB's.  Any further injections are on the back burner given her new findings. 6.Osteoarthritis of hands. Voltaren gel 3 times daily.  7. Right shoulder pain. , left shoulder pain now.  -left shoulder appears to be biceps tendon short head     -After informed consent and preparation of the skin with betadine and isopropyl alcohol, I injected 6mg  (1cc) of celestone and 4cc of 1% lidocaine around the short head biceps tendon left via anterior approach. Additionally, aspiration was performed prior to injection. The patient tolerated well, and no complications were encountered. Afterward the area was cleaned and dressed. Post- injection instructions were provided.  Exercises provided    -mainatin HEP    Thirty  minutes of face to face patient care time were spent during this visit. All questions were encouraged and answered. Follow up in a month

## 2018-07-16 DIAGNOSIS — N390 Urinary tract infection, site not specified: Secondary | ICD-10-CM | POA: Diagnosis not present

## 2018-07-16 DIAGNOSIS — E0590 Thyrotoxicosis, unspecified without thyrotoxic crisis or storm: Secondary | ICD-10-CM | POA: Diagnosis not present

## 2018-07-16 DIAGNOSIS — I10 Essential (primary) hypertension: Secondary | ICD-10-CM | POA: Diagnosis not present

## 2018-07-16 DIAGNOSIS — Z0000 Encounter for general adult medical examination without abnormal findings: Secondary | ICD-10-CM | POA: Diagnosis not present

## 2018-07-16 DIAGNOSIS — I700 Atherosclerosis of aorta: Secondary | ICD-10-CM | POA: Diagnosis not present

## 2018-07-16 DIAGNOSIS — E041 Nontoxic single thyroid nodule: Secondary | ICD-10-CM | POA: Diagnosis not present

## 2018-07-16 DIAGNOSIS — Z7189 Other specified counseling: Secondary | ICD-10-CM | POA: Diagnosis not present

## 2018-07-16 DIAGNOSIS — G894 Chronic pain syndrome: Secondary | ICD-10-CM | POA: Diagnosis not present

## 2018-07-16 DIAGNOSIS — E782 Mixed hyperlipidemia: Secondary | ICD-10-CM | POA: Diagnosis not present

## 2018-08-06 ENCOUNTER — Ambulatory Visit
Admission: RE | Admit: 2018-08-06 | Discharge: 2018-08-06 | Disposition: A | Discharge status: Home / Self Care | Payer: Medicare Other | Source: Ambulatory Visit | Attending: Internal Medicine | Admitting: Internal Medicine

## 2018-08-06 DIAGNOSIS — E041 Nontoxic single thyroid nodule: Secondary | ICD-10-CM

## 2018-08-09 ENCOUNTER — Telehealth: Payer: Self-pay

## 2018-08-09 DIAGNOSIS — M533 Sacrococcygeal disorders, not elsewhere classified: Secondary | ICD-10-CM

## 2018-08-09 DIAGNOSIS — M961 Postlaminectomy syndrome, not elsewhere classified: Secondary | ICD-10-CM

## 2018-08-09 DIAGNOSIS — M5416 Radiculopathy, lumbar region: Secondary | ICD-10-CM

## 2018-08-09 DIAGNOSIS — M47816 Spondylosis without myelopathy or radiculopathy, lumbar region: Secondary | ICD-10-CM

## 2018-08-09 MED ORDER — OXYCODONE-ACETAMINOPHEN 10-325 MG PO TABS: 1.0000 | ORAL_TABLET | Freq: Three times a day (TID) | ORAL | 0 refills | Status: DC | PRN

## 2018-08-09 MED ORDER — FENTANYL 25 MCG/HR TD PT72: 1.0000 | MEDICATED_PATCH | TRANSDERMAL | 0 refills | Status: DC

## 2018-08-09 NOTE — Telephone Encounter (Signed)
Both meds refilled

## 2018-08-09 NOTE — Telephone Encounter (Signed)
Patient called requesting refill on Oxycodone/APAP and Fentanyl both last filled 07/07/2018, next appt 08/18/2018.

## 2018-08-10 NOTE — Telephone Encounter (Signed)
Pt.notified

## 2018-08-11 ENCOUNTER — Other Ambulatory Visit: Payer: Self-pay | Admitting: Physical Medicine & Rehabilitation

## 2018-08-11 DIAGNOSIS — M71571 Other bursitis, not elsewhere classified, right ankle and foot: Secondary | ICD-10-CM | POA: Diagnosis not present

## 2018-08-11 DIAGNOSIS — M2041 Other hammer toe(s) (acquired), right foot: Secondary | ICD-10-CM | POA: Diagnosis not present

## 2018-08-11 DIAGNOSIS — M79674 Pain in right toe(s): Secondary | ICD-10-CM | POA: Diagnosis not present

## 2018-08-11 DIAGNOSIS — G253 Myoclonus: Secondary | ICD-10-CM

## 2018-08-15 ENCOUNTER — Telehealth: Payer: Self-pay | Admitting: Physical Medicine & Rehabilitation

## 2018-08-15 DIAGNOSIS — M5416 Radiculopathy, lumbar region: Secondary | ICD-10-CM

## 2018-08-15 DIAGNOSIS — M961 Postlaminectomy syndrome, not elsewhere classified: Secondary | ICD-10-CM

## 2018-08-16 NOTE — Telephone Encounter (Signed)
Medication refilled

## 2018-08-18 ENCOUNTER — Other Ambulatory Visit: Payer: Self-pay

## 2018-08-18 ENCOUNTER — Encounter: Payer: Self-pay | Admitting: Physical Medicine & Rehabilitation

## 2018-08-18 ENCOUNTER — Encounter: Payer: Medicare HMO | Attending: Physical Medicine and Rehabilitation | Admitting: Physical Medicine & Rehabilitation

## 2018-08-18 VITALS — BP 139/83 | HR 81 | Temp 99.2°F | Ht 63.0 in | Wt 128.0 lb

## 2018-08-18 DIAGNOSIS — M7522 Bicipital tendinitis, left shoulder: Secondary | ICD-10-CM | POA: Diagnosis not present

## 2018-08-18 DIAGNOSIS — M5416 Radiculopathy, lumbar region: Secondary | ICD-10-CM

## 2018-08-18 DIAGNOSIS — M47816 Spondylosis without myelopathy or radiculopathy, lumbar region: Secondary | ICD-10-CM | POA: Diagnosis not present

## 2018-08-18 DIAGNOSIS — M961 Postlaminectomy syndrome, not elsewhere classified: Secondary | ICD-10-CM

## 2018-08-18 DIAGNOSIS — M533 Sacrococcygeal disorders, not elsewhere classified: Secondary | ICD-10-CM | POA: Diagnosis not present

## 2018-08-18 DIAGNOSIS — M7582 Other shoulder lesions, left shoulder: Secondary | ICD-10-CM | POA: Diagnosis not present

## 2018-08-18 DIAGNOSIS — M2170 Unequal limb length (acquired), unspecified site: Secondary | ICD-10-CM | POA: Diagnosis not present

## 2018-08-18 HISTORY — DX: Bicipital tendinitis, left shoulder: M75.22

## 2018-08-18 MED ORDER — FENTANYL 25 MCG/HR TD PT72: 1.0000 | MEDICATED_PATCH | TRANSDERMAL | 0 refills | Status: DC

## 2018-08-18 MED ORDER — OXYCODONE-ACETAMINOPHEN 10-325 MG PO TABS: 1.0000 | ORAL_TABLET | Freq: Three times a day (TID) | ORAL | 0 refills | Status: DC | PRN

## 2018-08-18 NOTE — Patient Instructions (Signed)
PLEASE FEEL FREE TO CALL OUR OFFICE WITH ANY PROBLEMS OR QUESTIONS ([phone])      

## 2018-08-18 NOTE — Progress Notes (Signed)
Subjective:    Patient ID: Alexandria Mclaughlin, female    DOB: 09/07/1940, 78 y.o.   MRN: [id number]  HPI  Alexandria Mclaughlin is here in follow up of her chronic pain and gait disorder. She is noticing some improvement in her low back pain.  She is using a cane or rolling walker for balance. She uses the walker more for exercises or long dx. She saw a podiatrist for her left first toe which was curling under. She was given an injection and an orthotic. The left shoulder injection gave her nice results. Her range of motion isn't quite back but pain levels are improved.   She remains on oxycodone and fentanyl for pain control      Pain Inventory Average Pain 4 Pain Right Now 4 My pain is constant, dull and aching  In the last 24 hours, has pain interfered with the following? General activity 5 Relation with others 5 Enjoyment of life 7 What TIME of day is your pain at its worst? daytime Sleep (in general) Good  Pain is worse with: walking, bending, sitting and standing Pain improves with: rest, heat/ice, medication and injections Relief from Meds: 6  Mobility ability to climb steps?  yes do you drive?  yes  Function Do you have any goals in this area?  no  Neuro/Psych numbness trouble walking  Prior Studies Any changes since last visit?  no  Physicians involved in your care Any changes since last visit?  no   Family History  Problem Relation Age of Onset  . Diabetes Mother   . Heart disease Mother   . Stroke Mother   . Heart disease Father   . Heart disease Brother    Social History   Socioeconomic History  . Marital status: Widowed    Spouse name: Not on file  . Number of children: Not on file  . Years of education: Not on file  . Highest education level: Not on file  Occupational History  . Not on file  Social Needs  . Financial resource strain: Not on file  . Food insecurity    Worry: Not on file    Inability: Not on file  . Transportation needs    Medical: Not on  file    Non-medical: Not on file  Tobacco Use  . Smoking status: Former Smoker    Packs/day: 0.50    Years: 5.00    Pack years: 2.50    Types: Cigarettes    Quit date: 03/15/1968    Years since quitting: 50.4  . Smokeless tobacco: Never Used  Substance and Sexual Activity  . Alcohol use: Not Currently    Alcohol/week: 1.0 standard drinks    Types: 1 Glasses of wine per week    Comment: occasional glass of wine  . Drug use: No  . Sexual activity: Not on file  Lifestyle  . Physical activity    Days per week: Not on file    Minutes per session: Not on file  . Stress: Not on file  Relationships  . Social Herbalist on phone: Not on file    Gets together: Not on file    Attends religious service: Not on file    Active member of club or organization: Not on file    Attends meetings of clubs or organizations: Not on file    Relationship status: Not on file  Other Topics Concern  . Not on file  Social History Narrative  .  Not on file   Past Surgical History:  Procedure Laterality Date  . ABDOMINAL HYSTERECTOMY    . BACK SURGERY     2011  . BLADDER SURGERY  1985  . CESAREAN SECTION     x2  . RETINAL DETACHMENT SURGERY  9/13  . TRANSFORAMINAL LUMBAR INTERBODY FUSION W/ MIS 1 LEVEL Right 04/26/2018   Procedure: Right Lumbar four-five Minimally invasive Transforaminal lumbar interbody fusion;  Surgeon: Judith Part, MD;  Location: [place];  Service: Neurosurgery;  Laterality: Right;   Past Medical History:  Diagnosis Date  . Cervical facet syndrome   . Depression   . Disorders of sacrum   . Enthesopathy of hip region   . GERD (gastroesophageal reflux disease)   . Headache   . History of kidney stones   . Hypertension   . Sciatic neuritis   . Synovitis and tenosynovitis   . Vision abnormalities    There were no vitals taken for this visit.  Opioid Risk Score:   Fall Risk Score:  `1  Depression screen PHQ 2/9  Depression screen [facility] 2/9 01/25/2018  10/12/2017 06/03/2017 04/08/2017 10/22/2015 05/23/2015 03/20/2015  Decreased Interest 0 0 0 0 0 0 0  Down, Depressed, Hopeless 0 0 0 0 0 0 0  PHQ - 2 Score 0 0 0 0 0 0 0  Altered sleeping - - 0 - - 0 -  Tired, decreased energy - - 0 - - 3 -  Change in appetite - - 0 - - 3 -  Feeling bad or failure about yourself  - - 0 - - 0 -  Trouble concentrating - - 0 - - 0 -  Moving slowly or fidgety/restless - - 0 - - 0 -  Suicidal thoughts - - 0 - - 0 -  PHQ-9 Score - - 0 - - 6 -  Some recent data might be hidden     Review of Systems  Constitutional: Negative.   HENT: Negative.   Eyes: Negative.   Respiratory: Negative.   Cardiovascular: Negative.   Gastrointestinal: Negative.   Endocrine: Negative.   Genitourinary: Negative.   Musculoskeletal: Positive for arthralgias, gait problem and myalgias.  Skin: Negative.   Allergic/Immunologic: Negative.   Neurological: Positive for numbness.  Hematological: Negative.   Psychiatric/Behavioral: Negative.   All other systems reviewed and are negative.      Objective:   Physical Exam General: No acute distress HEENT: EOMI, oral membranes moist Cards: reg rate  Chest: normal effort Abdomen: Soft, NT, ND Skin: dry, intact Extremities: no edema  Neurological:she is alert and appropriate.strength 5/5UE, 3-4/5 RLE and 4/5 LLE. DTr's 1+ to trace in LE's. sensory loss right foot. First aDF and EHL weakness.  Musc:left shoulder bicipital tendon pain short head with minimal tenderness. Can flex to 90 degrees. LB with limited ROM, pain with extension and flexion  Psychiatric:pleasant .         1.Chronic low back pain with lumbar postlaminectomy syndrome. Recent fall with new L1 compression fracture.:  -had good results with her L4-5 fusion/decompression/scar tissue removal by NS -continue fentanyl patch16mcgevery 72 hours, #10. ContinuePercocet 10/325 #75.  -We will continue the controlled substance monitoring  program, this consists of regular clinic visits, examinations, routine drug screening, pill counts as well as use of [place] Controlled Substance Reporting System. [place] today.   .     2. Neuropathic Pain/right sided lumbar radicular pain, L4---improved after surgery              -  some residual numbness in the leg.   --orthotics, appropriate shoe wear for foot drop, EHL weakness 3. Gait Disorder: Continue HEP /Neurology Following -RW for now  4. Myoclonus with sleep: Continue Klonopin 5.Lumbar facet arthropathy:had previous results with MBB's.Any further injections are on the back burner given her new findings. 6.Osteoarthritis of hands. Voltaren gel 3 times daily.  7. Right shoulder and left shoulder pain, left biceps tendonitis -left shoulder improved after injetion     -mainatin HEP including shoulder ROM exercises  Fifteen minutes of face to face patient care time were spent during this visit. All questions were encouraged and answered.  Follow up with me in 6 weeks .

## 2018-09-01 DIAGNOSIS — M71571 Other bursitis, not elsewhere classified, right ankle and foot: Secondary | ICD-10-CM | POA: Diagnosis not present

## 2018-09-15 DIAGNOSIS — M24574 Contracture, right foot: Secondary | ICD-10-CM | POA: Diagnosis not present

## 2018-09-22 DIAGNOSIS — H43813 Vitreous degeneration, bilateral: Secondary | ICD-10-CM | POA: Diagnosis not present

## 2018-09-22 DIAGNOSIS — H35371 Puckering of macula, right eye: Secondary | ICD-10-CM | POA: Diagnosis not present

## 2018-09-22 DIAGNOSIS — H40023 Open angle with borderline findings, high risk, bilateral: Secondary | ICD-10-CM | POA: Diagnosis not present

## 2018-09-22 DIAGNOSIS — H353131 Nonexudative age-related macular degeneration, bilateral, early dry stage: Secondary | ICD-10-CM | POA: Diagnosis not present

## 2018-10-07 DIAGNOSIS — M810 Age-related osteoporosis without current pathological fracture: Secondary | ICD-10-CM | POA: Diagnosis not present

## 2018-10-13 ENCOUNTER — Telehealth: Payer: Self-pay

## 2018-10-13 DIAGNOSIS — M5416 Radiculopathy, lumbar region: Secondary | ICD-10-CM

## 2018-10-13 DIAGNOSIS — M533 Sacrococcygeal disorders, not elsewhere classified: Secondary | ICD-10-CM

## 2018-10-13 DIAGNOSIS — M47816 Spondylosis without myelopathy or radiculopathy, lumbar region: Secondary | ICD-10-CM

## 2018-10-13 DIAGNOSIS — M961 Postlaminectomy syndrome, not elsewhere classified: Secondary | ICD-10-CM

## 2018-10-13 MED ORDER — FENTANYL 25 MCG/HR TD PT72: 1.0000 | MEDICATED_PATCH | TRANSDERMAL | 0 refills | Status: DC

## 2018-10-13 NOTE — Telephone Encounter (Signed)
OV is after RF date. Med sent in

## 2018-10-13 NOTE — Telephone Encounter (Signed)
Patient called requesting a refill of fentanyl patches.  Last dispensing according to PMP website was:  09/09/2018 2 08/18/2018 Fentanyl 25 Mcg/hr Patch 10.00 30 Za Swa

## 2018-10-18 ENCOUNTER — Other Ambulatory Visit: Payer: Self-pay | Admitting: Physical Medicine & Rehabilitation

## 2018-10-20 ENCOUNTER — Encounter: Payer: Medicare HMO | Attending: Physical Medicine and Rehabilitation | Admitting: Physical Medicine & Rehabilitation

## 2018-10-20 ENCOUNTER — Other Ambulatory Visit: Payer: Self-pay

## 2018-10-20 ENCOUNTER — Encounter: Payer: Self-pay | Admitting: Physical Medicine & Rehabilitation

## 2018-10-20 VITALS — BP 145/90 | HR 91 | Temp 99.3°F | Ht 63.0 in | Wt 130.0 lb

## 2018-10-20 DIAGNOSIS — M2170 Unequal limb length (acquired), unspecified site: Secondary | ICD-10-CM | POA: Diagnosis not present

## 2018-10-20 DIAGNOSIS — M533 Sacrococcygeal disorders, not elsewhere classified: Secondary | ICD-10-CM | POA: Diagnosis not present

## 2018-10-20 DIAGNOSIS — M47816 Spondylosis without myelopathy or radiculopathy, lumbar region: Secondary | ICD-10-CM | POA: Diagnosis not present

## 2018-10-20 DIAGNOSIS — M7522 Bicipital tendinitis, left shoulder: Secondary | ICD-10-CM

## 2018-10-20 DIAGNOSIS — M75101 Unspecified rotator cuff tear or rupture of right shoulder, not specified as traumatic: Secondary | ICD-10-CM

## 2018-10-20 DIAGNOSIS — M75102 Unspecified rotator cuff tear or rupture of left shoulder, not specified as traumatic: Secondary | ICD-10-CM | POA: Diagnosis not present

## 2018-10-20 DIAGNOSIS — M961 Postlaminectomy syndrome, not elsewhere classified: Secondary | ICD-10-CM

## 2018-10-20 DIAGNOSIS — M5416 Radiculopathy, lumbar region: Secondary | ICD-10-CM | POA: Diagnosis not present

## 2018-10-20 MED ORDER — FENTANYL 25 MCG/HR TD PT72: 1.0000 | MEDICATED_PATCH | TRANSDERMAL | 0 refills | Status: DC

## 2018-10-20 MED ORDER — PREDNISONE 10 MG PO TABS: 10.0000 mg | ORAL_TABLET | ORAL | 0 refills | Status: DC

## 2018-10-20 MED ORDER — OXYCODONE-ACETAMINOPHEN 10-325 MG PO TABS: 1.0000 | ORAL_TABLET | Freq: Three times a day (TID) | ORAL | 0 refills | Status: DC | PRN

## 2018-10-20 NOTE — Patient Instructions (Signed)
PLEASE FEEL FREE TO CALL OUR OFFICE WITH ANY PROBLEMS OR QUESTIONS ([phone])      

## 2018-10-20 NOTE — Progress Notes (Signed)
Subjective:    Patient ID: Alexandria Mclaughlin, female    DOB: 17-Mar-1940, 78 y.o.   MRN: [id number]  HPI   Alexandria Mclaughlin is here in follow-up of her gait disorder and chronic pain.  She states that her left shoulder has been bothering her somewhat but now her right shoulder is the one that seems to be acting up.  She states that the pain seems to be rotating between shoulders.  She had good results with the left shoulder injection we last performed.  Low back pain seems generally tolerable at present.  She is using a rolling walker for balance.  For pain she is taking fentanyl patch with oxycodone for breakthrough pain.   Pain Inventory Average Pain 5 Pain Right Now 5 My pain is constant and sharp  In the last 24 hours, has pain interfered with the following? General activity 6 Relation with others 5 Enjoyment of life 6 What TIME of day is your pain at its worst? daytime and night Sleep (in general) Fair  Pain is worse with: sitting and standing Pain improves with: heat/ice, medication and injections Relief from Meds: 5  Mobility ability to climb steps?  yes do you drive?  yes  Function Do you have any goals in this area?  no  Neuro/Psych No problems in this area  Prior Studies Any changes since last visit?  no  Physicians involved in your care Any changes since last visit?  yes   Family History  Problem Relation Age of Onset  . Diabetes Mother   . Heart disease Mother   . Stroke Mother   . Heart disease Father   . Heart disease Brother    Social History   Socioeconomic History  . Marital status: Widowed    Spouse name: Not on file  . Number of children: Not on file  . Years of education: Not on file  . Highest education level: Not on file  Occupational History  . Not on file  Social Needs  . Financial resource strain: Not on file  . Food insecurity    Worry: Not on file    Inability: Not on file  . Transportation needs    Medical: Not on file    Non-medical:  Not on file  Tobacco Use  . Smoking status: Former Smoker    Packs/day: 0.50    Years: 5.00    Pack years: 2.50    Types: Cigarettes    Quit date: 03/15/1968    Years since quitting: 50.6  . Smokeless tobacco: Never Used  Substance and Sexual Activity  . Alcohol use: Not Currently    Alcohol/week: 1.0 standard drinks    Types: 1 Glasses of wine per week    Comment: occasional glass of wine  . Drug use: No  . Sexual activity: Not on file  Lifestyle  . Physical activity    Days per week: Not on file    Minutes per session: Not on file  . Stress: Not on file  Relationships  . Social Herbalist on phone: Not on file    Gets together: Not on file    Attends religious service: Not on file    Active member of club or organization: Not on file    Attends meetings of clubs or organizations: Not on file    Relationship status: Not on file  Other Topics Concern  . Not on file  Social History Narrative  . Not on file  Past Surgical History:  Procedure Laterality Date  . ABDOMINAL HYSTERECTOMY    . BACK SURGERY     2011  . BLADDER SURGERY  1985  . CESAREAN SECTION     x2  . RETINAL DETACHMENT SURGERY  9/13  . TRANSFORAMINAL LUMBAR INTERBODY FUSION W/ MIS 1 LEVEL Right 04/26/2018   Procedure: Right Lumbar four-five Minimally invasive Transforaminal lumbar interbody fusion;  Surgeon: Judith Part, MD;  Location: [facility];  Service: Neurosurgery;  Laterality: Right;   Past Medical History:  Diagnosis Date  . Cervical facet syndrome   . Depression   . Disorders of sacrum   . Enthesopathy of hip region   . GERD (gastroesophageal reflux disease)   . Headache   . History of kidney stones   . Hypertension   . Sciatic neuritis   . Synovitis and tenosynovitis   . Vision abnormalities    BP (!) 145/90   Pulse 91   Temp 99.3 F (37.4 C) Comment: took again with mask down 99.9  Ht 5\' 3"  (1.6 m)   Wt 130 lb (59 kg)   SpO2 92%   BMI 23.03 kg/m   Opioid Risk  Score:   Fall Risk Score:  `1  Depression screen PHQ 2/9  Depression screen [facility] 2/9 01/25/2018 10/12/2017 06/03/2017 04/08/2017 10/22/2015 05/23/2015 03/20/2015  Decreased Interest 0 0 0 0 0 0 0  Down, Depressed, Hopeless 0 0 0 0 0 0 0  PHQ - 2 Score 0 0 0 0 0 0 0  Altered sleeping - - 0 - - 0 -  Tired, decreased energy - - 0 - - 3 -  Change in appetite - - 0 - - 3 -  Feeling bad or failure about yourself  - - 0 - - 0 -  Trouble concentrating - - 0 - - 0 -  Moving slowly or fidgety/restless - - 0 - - 0 -  Suicidal thoughts - - 0 - - 0 -  PHQ-9 Score - - 0 - - 6 -  Some recent data might be hidden     Review of Systems  Constitutional: Negative.   HENT: Negative.   Eyes: Negative.   Respiratory: Negative.   Cardiovascular: Negative.   Gastrointestinal: Negative.   Endocrine: Negative.   Genitourinary: Negative.   Musculoskeletal: Negative.   Skin: Negative.   Allergic/Immunologic: Negative.   Neurological: Negative.   Hematological: Negative.   Psychiatric/Behavioral: Negative.   All other systems reviewed and are negative.      Objective:   Physical Exam General: No acute distress HEENT: EOMI, oral membranes moist Cards: reg rate  Chest: normal effort Abdomen: Soft, NT, ND Skin: dry, intact Extremities: no edema Neurological:she is alert and appropriate.strength 5/5UE, 3-4/5 RLE and 4/5 LLE. DTr's 1+ to trace in LE's. sensory loss right foot. First aDF and EHL weakness. --No motor changes Musc:Left shoulder is tender with internal and external rotation.  Impingement maneuver equivocal.  Right shoulder more tender with internal and external rotation and impingement maneuver is positive.  Low back is tender to palpation. Psychiatric:pleasant .         1.Chronic low back pain with lumbar postlaminectomy syndrome. Recent fall with new L1 compression fracture.: -had good results with her L4-5 fusion/decompression/scar tissue removal by NS -continue  fentanyl patch54mcgevery 72 hours, #10. ContinuePercocet 10/325 #75.  -We will continue the controlled substance monitoring program, this consists of regular clinic visits, examinations, routine drug screening, pill counts as well as use of  [place] Controlled Substance Reporting System. [place] today.    2. Neuropathic Pain/right sided lumbar radicular pain, L4---improved after surgery -some residual numbness in the leg.  --orthotics, appropriate shoe wear for foot drop, EHL weakness 3. Gait Disorder: Continue HEP /Neurology Following -RW for now  4. Myoclonus with sleep: Continue Klonopin 5.Lumbar facet arthropathy:had previous results with MBB's.Any further injections are on the back burner given her new findings. 6.Osteoarthritis of hands. Voltaren gel 3 times daily.  7. Right shoulder and left shoulder pain, left biceps tendonitis -left shoulder improved after injetion.  Right shoulder more tender now.  -Made referral to [facility] therapies at [facility] to address shoulder strength and range of motion as well as home exercise program.  -Wrote her a brief prednisone taper.  Hold off on injection right now. -I suspect some of the waxing and waning upper pain is due to the fact that she is using one arm or the other or often depending on which 1 is causing pain  Fifteen minutes of face to face patient care time were spent during this visit. All questions were encouraged and answered.  Follow up with me in about 4-6 weeks .

## 2018-10-25 DIAGNOSIS — R69 Illness, unspecified: Secondary | ICD-10-CM | POA: Diagnosis not present

## 2018-10-28 ENCOUNTER — Other Ambulatory Visit: Payer: Self-pay

## 2018-10-28 ENCOUNTER — Encounter: Payer: Self-pay | Admitting: Physical Therapy

## 2018-10-28 ENCOUNTER — Ambulatory Visit: Payer: Medicare HMO | Attending: Physical Medicine & Rehabilitation | Admitting: Physical Therapy

## 2018-10-28 DIAGNOSIS — R293 Abnormal posture: Secondary | ICD-10-CM | POA: Diagnosis not present

## 2018-10-28 DIAGNOSIS — M6281 Muscle weakness (generalized): Secondary | ICD-10-CM | POA: Insufficient documentation

## 2018-10-28 DIAGNOSIS — M25511 Pain in right shoulder: Secondary | ICD-10-CM | POA: Diagnosis not present

## 2018-10-28 NOTE — Therapy (Addendum)
[facility] [address] [place], [place], [address] Phone: [phone]   Fax:  [phone]  Physical Therapy Evaluation  Patient Details  Name: Alexandria Mclaughlin MRN: [id number] Date of Birth: 11/21/1940 Referring Provider (PT): Dr. Alger Simons   Encounter Date: 10/28/2018  PT End of Session - 10/28/18 1720    Visit Number  1    Date for PT Re-Evaluation  12/23/18    Authorization Type  [employer]na Medicare    PT Start Time  3536    PT Stop Time  1610    PT Time Calculation (min)  40 min    Activity Tolerance  Patient tolerated treatment well;No increased pain    Behavior During Therapy  WFL for tasks assessed/performed       Past Medical History:  Diagnosis Date  . Cervical facet syndrome   . Depression   . Disorders of sacrum   . Enthesopathy of hip region   . GERD (gastroesophageal reflux disease)   . Headache   . History of kidney stones   . Hypertension   . Sciatic neuritis   . Synovitis and tenosynovitis   . Vision abnormalities     Past Surgical History:  Procedure Laterality Date  . ABDOMINAL HYSTERECTOMY    . BACK SURGERY     2011  . BLADDER SURGERY  1985  . CESAREAN SECTION     x2  . RETINAL DETACHMENT SURGERY  9/13  . TRANSFORAMINAL LUMBAR INTERBODY FUSION W/ MIS 1 LEVEL Right 04/26/2018   Procedure: Right Lumbar four-five Minimally invasive Transforaminal lumbar interbody fusion;  Surgeon: Judith Part, MD;  Location: [facility];  Service: Neurosurgery;  Laterality: Right;    There were no vitals filed for this visit.   Subjective Assessment - 10/28/18 1536    Subjective  Patient has chronic shoulder pain that comes and goes. Pain restricts her movement. Uable to lay on side.    Patient Stated Goals  reduce pain in shoulders    Currently in Pain?  Yes    Pain Score  3    max 5/10   Pain Location  Shoulder    Pain Orientation  Right    Pain Descriptors / Indicators  Sharp    Pain Onset   More than a month ago    Pain Frequency  Intermittent    Aggravating Factors   moving arm up and down, laying on side, reaching behind her    Pain Relieving Factors  arms at side    Multiple Pain Sites  Yes    Pain Score  1   max 5/10   Pain Location  Shoulder    Pain Orientation  Left    Pain Descriptors / Indicators  Sharp    Pain Type  Chronic pain    Pain Onset  More than a month ago    Pain Frequency  Intermittent    Aggravating Factors   reaching, lifting, laying on it    Pain Relieving Factors  arm at side         [facility] PT Assessment - 10/28/18 0001      Assessment   Medical Diagnosis  M75.101 rotator cuff syndrome, right; M75.102 rotator cuffsyndrome, left    Referring Provider (PT)  Dr. Alger Simons    Onset Date/Surgical Date  --   chronic for 1 year   Prior Therapy  none      Precautions   Precautions  None  Restrictions   Weight Bearing Restrictions  No      Balance Screen   Has the patient fallen in the past 6 months  No    Has the patient had a decrease in activity level because of a fear of falling?   No    Is the patient reluctant to leave their home because of a fear of falling?   No      Home Film/video editor residence    Living Arrangements  Spouse/significant other      Prior Function   Level of [place]  Retired      Associate Professor   Overall Cognitive Status  Within Functional Limits for tasks assessed      Observation/Other Assessments   Focus on Therapeutic Outcomes (FOTO)   48% limitaiton; goal is 40% limitation      Posture/Postural Control   Posture/Postural Control  Postural limitations    Postural Limitations  Rounded Shoulders;Forward head      ROM / Strength   AROM / PROM / Strength  AROM;PROM;Strength      AROM   Right Shoulder Flexion  100 Degrees    Right Shoulder ABduction  104 Degrees   some scaption   Left Shoulder Flexion  110 Degrees    Left Shoulder ABduction   103 Degrees   some scaption     PROM   Right Shoulder Flexion  170 Degrees    Right Shoulder ABduction  145 Degrees    Right Shoulder Internal Rotation  43 Degrees    Right Shoulder External Rotation  85 Degrees    Left Shoulder Flexion  125 Degrees    Left Shoulder ABduction  97 Degrees    Left Shoulder Internal Rotation  54 Degrees    Left Shoulder External Rotation  72 Degrees      Strength   Right Shoulder Flexion  3+/5    Right Shoulder Extension  4-/5    Right Shoulder ABduction  3+/5    Right Shoulder Internal Rotation  4/5    Right Shoulder External Rotation  3+/5    Left Shoulder Flexion  3+/5    Left Shoulder Extension  4-/5    Left Shoulder ABduction  3+/5    Left Shoulder Internal Rotation  4/5    Left Shoulder External Rotation  3+/5      Palpation   Palpation comment  tenderness in the RTC, biceps tendon, pectoralis, right interscapular                Objective measurements completed on examination: See above findings.              PT Education - 10/28/18 1606    Education Details  Access Code: [email or web address]    Person(s) Educated  Patient    Methods  Explanation;Demonstration;Verbal cues;Handout    Comprehension  Returned demonstration;Verbalized understanding       PT Short Term Goals - 10/28/18 1721      PT SHORT TERM GOAL #1   Title  independent with initial HEP    Time  4    Period  Weeks    Status  New    Target Date  11/25/18      PT SHORT TERM GOAL #2   Title  bilateral shoulder flexion increased by 20 degrees for P/ROM    Time  4    Period  Weeks    Status  New  Target Date  11/25/18      PT SHORT TERM GOAL #3   Title  bilateral shoulder abduction P/ROM increases by 20 degrees    Time  4    Period  Weeks    Status  New    Target Date  11/25/18      PT SHORT TERM GOAL #4   Title  Humeral and scapula rhythm improved due to pain in bilateral shoulders decreased >/= 25%    Time  4    Period  Weeks    Status  New     Target Date  11/25/18        PT Long Term Goals - 10/28/18 1724      PT LONG TERM GOAL #1   Title  FOTO < or = to 40% limited    Time  8    Period  Weeks    Status  New    Target Date  12/23/18      PT LONG TERM GOAL #2   Title  Pt will be able to demonstrate AROM Rt shoulder flexion and abduction of 130 or more for ability to do overhead tasks    Time  8    Status  New    Target Date  12/23/18      PT LONG TERM GOAL #3   Title  Pt will demonstrate MMT Rt shoulder horizontal adduction and flexion of at least 4/5 for functional reaching and lifting.    Time  8    Period  Weeks    Status  New    Target Date  12/23/18      PT LONG TERM GOAL #4   Title  Pt will report 50% less shoulder pain during typical daily activities.    Time  8    Period  Weeks    Status  New    Target Date  12/23/18             Plan - 10/28/18 1606    Clinical Impression Statement  Patient is a 78 year old female with bilateral shoulder rotator cuff syndrome for the past year. The pain averages between 1-5/10 with overhead movements, out to the side and lifting. Bilateral shoulder have decreased ROM with left worse than the right. Bilateral shoulder strength averages 3+/5. Palpable tenderness located in bilateral bices tendon, RTC insertion, pectoralis and right interscapular area. Patient has decreased movement of the scapula with shoulder movement. Patient posture consists with forward head, rounded shoulders and increased thoracic kyphosis. Patient will benefit from skilled therapy to improve bilateral shoulder ROM and strength to reduce pain and improve function.    Personal Factors and Comorbidities  Age;Fitness;Comorbidity 1;Past/Current Experience    Comorbidities  osteoporosis    Examination-Activity Limitations  Bathing;Reach Overhead;Dressing    Stability/Clinical Decision Making  Evolving/Moderate complexity    Clinical Decision Making  Low    Rehab Potential  Excellent    PT Frequency   2x / week    PT Duration  8 weeks    PT Treatment/Interventions  Cryotherapy;Electrical Stimulation;Iontophoresis 30m/ml Dexamethasone;Moist Heat;Ultrasound;Therapeutic exercise;Therapeutic activities;Neuromuscular re-education;Patient/family education;Dry needling;Passive range of motion;Manual techniques    PT Next Visit Plan  overhead pulleys, laying on foam roll, pendulums, isometrics, scapula strength; posture education, modalities as needed    PT Home Exercise Plan  Access Code: [email or web address]   Recommended Other Services  mD put in notes: increase shoulder girdle, scapular and humeral ROM, re-establis better S-H rhythm, strengthening exercises, modalities and  establish HEP    Consulted and Agree with Plan of Care  Patient       Patient will benefit from skilled therapeutic intervention in order to improve the following deficits and impairments:  Decreased range of motion, Increased fascial restricitons, Increased muscle spasms, Decreased activity tolerance, Pain, Decreased strength, Decreased mobility, Postural dysfunction  Visit Diagnosis: Right shoulder pain, unspecified chronicity - Plan: PT plan of care cert/re-cert  Muscle weakness (generalized) - Plan: PT plan of care cert/re-cert  Abnormal posture - Plan: PT plan of care cert/re-cert     Problem List Patient Active Problem List   Diagnosis Date Noted  . Biceps tendonitis on left 08/18/2018  . Rotator cuff tendonitis, left 06/08/2018  . Lumbar radiculopathy 04/26/2018  . Lumbar radicular pain 07/29/2016  . Myoclonus 09/19/2015  . Therapeutic opioid induced constipation 07/25/2015  . White matter changes 02/07/2015  . Essential hypertension 02/07/2015  . HLD (hyperlipidemia) 02/07/2015  . Low back pain 02/07/2015  . BPPV (benign paroxysmal positional vertigo) 02/07/2015  . Abnormality of gait 02/07/2015  . Lumbar facet arthropathy 07/05/2014  . Acquired unequal leg length on left 08/08/2011  . Lumbar post-laminectomy  syndrome 06/11/2011  . Sacroiliac joint dysfunction 06/11/2011    Earlie Counts, PT 10/28/18 5:29 PM   [facility] [address], [place], [place], [address] Phone: [phone]   Fax:  [phone]  Name: ANDRIAN URBACH MRN: [id number] Date of Birth: 04/26/1940 PHYSICAL THERAPY DISCHARGE SUMMARY  Visits from Start of Care: 1  Current functional level related to goals / functional outcomes: Patient called and wanted to be discharged due to feeling better and needing to help her son.    Remaining deficits: See above.  Unable to assess patient due to her not returning for an official assessment.   Education / Equipment: HEP Plan: Patient agrees to discharge.  Patient goals were not met. Patient is being discharged due to being pleased with the current functional level.  Thank you for the referral. Antoninette Lerner, PT 11/08/18 1:29 PM  ?????

## 2018-10-28 NOTE — Patient Instructions (Signed)
Access Code: [email or web address]  URL: [email or web address]  Date: 10/28/2018  Prepared by: Earlie Counts   Exercises Supine Scapular Retraction - 10 reps - 1 sets - 5 sec hold - 1x daily - 7x weekly Supine Shoulder Flexion Extension AAROM with Dowel - 10 reps - 1 sets - 1x daily - 7x weekly Supine Shoulder External Rotation AAROM with Dowel - 10 reps - 1 sets - 1x daily - 7x weekly [facility] [address] [place], [place] [address] Phone # [phone] Fax [phone]

## 2018-11-09 ENCOUNTER — Encounter: Payer: Medicare HMO | Admitting: Physical Therapy

## 2018-11-16 ENCOUNTER — Encounter: Payer: Medicare HMO | Admitting: Physical Therapy

## 2018-11-18 ENCOUNTER — Encounter: Payer: Medicare HMO | Admitting: Physical Therapy

## 2018-11-19 ENCOUNTER — Telehealth: Payer: Self-pay

## 2018-11-19 DIAGNOSIS — M7522 Bicipital tendinitis, left shoulder: Secondary | ICD-10-CM

## 2018-11-19 NOTE — Telephone Encounter (Signed)
Patient called requesting a refill for oxycodone medication.  According to PMP website last refill was completed on 10-19-2018 and patients next appointment date is on 11-24-2018 with Dr. Naaman Plummer

## 2018-11-22 MED ORDER — OXYCODONE-ACETAMINOPHEN 10-325 MG PO TABS: 1.0000 | ORAL_TABLET | Freq: Three times a day (TID) | ORAL | 0 refills | Status: DC | PRN

## 2018-11-22 NOTE — Telephone Encounter (Signed)
This provider reviewed PMP and placed a call to Alexandria Mclaughlin, she is aware her oxycodone was e-scribed today. She verbalizes understanding.

## 2018-11-23 ENCOUNTER — Encounter: Payer: Medicare HMO | Admitting: Physical Therapy

## 2018-11-23 DIAGNOSIS — H43813 Vitreous degeneration, bilateral: Secondary | ICD-10-CM | POA: Diagnosis not present

## 2018-11-23 DIAGNOSIS — H532 Diplopia: Secondary | ICD-10-CM | POA: Diagnosis not present

## 2018-11-23 DIAGNOSIS — H5022 Vertical strabismus, left eye: Secondary | ICD-10-CM | POA: Diagnosis not present

## 2018-11-23 DIAGNOSIS — H40023 Open angle with borderline findings, high risk, bilateral: Secondary | ICD-10-CM | POA: Diagnosis not present

## 2018-11-23 DIAGNOSIS — Z961 Presence of intraocular lens: Secondary | ICD-10-CM | POA: Diagnosis not present

## 2018-11-24 ENCOUNTER — Encounter: Payer: Self-pay | Admitting: Physical Medicine & Rehabilitation

## 2018-11-24 ENCOUNTER — Other Ambulatory Visit: Payer: Self-pay

## 2018-11-24 ENCOUNTER — Encounter: Payer: Medicare HMO | Attending: Physical Medicine and Rehabilitation | Admitting: Physical Medicine & Rehabilitation

## 2018-11-24 VITALS — BP 119/79 | HR 81 | Temp 97.7°F | Ht 63.0 in | Wt 131.0 lb

## 2018-11-24 DIAGNOSIS — R29818 Other symptoms and signs involving the nervous system: Secondary | ICD-10-CM | POA: Diagnosis not present

## 2018-11-24 DIAGNOSIS — M961 Postlaminectomy syndrome, not elsewhere classified: Secondary | ICD-10-CM

## 2018-11-24 DIAGNOSIS — M2170 Unequal limb length (acquired), unspecified site: Secondary | ICD-10-CM | POA: Insufficient documentation

## 2018-11-24 DIAGNOSIS — M7522 Bicipital tendinitis, left shoulder: Secondary | ICD-10-CM

## 2018-11-24 DIAGNOSIS — M5416 Radiculopathy, lumbar region: Secondary | ICD-10-CM | POA: Diagnosis not present

## 2018-11-24 DIAGNOSIS — M533 Sacrococcygeal disorders, not elsewhere classified: Secondary | ICD-10-CM | POA: Diagnosis not present

## 2018-11-24 DIAGNOSIS — R269 Unspecified abnormalities of gait and mobility: Secondary | ICD-10-CM | POA: Diagnosis not present

## 2018-11-24 DIAGNOSIS — M47816 Spondylosis without myelopathy or radiculopathy, lumbar region: Secondary | ICD-10-CM

## 2018-11-24 MED ORDER — OXYCODONE-ACETAMINOPHEN 10-325 MG PO TABS: 1.0000 | ORAL_TABLET | Freq: Three times a day (TID) | ORAL | 0 refills | Status: DC | PRN

## 2018-11-24 MED ORDER — FENTANYL 25 MCG/HR TD PT72: 1.0000 | MEDICATED_PATCH | TRANSDERMAL | 0 refills | Status: DC

## 2018-11-24 NOTE — Patient Instructions (Signed)
PLEASE USE YOUR WALKER AT ALL TIMES

## 2018-11-24 NOTE — Progress Notes (Signed)
Subjective:    Patient ID: Alexandria Mclaughlin, female    DOB: 1940/09/29, 78 y.o.   MRN: [id number]  HPI   Alexandria Mclaughlin is here in follow-up of her chronic pain syndrome.  I last saw her and sent in August.  Since we last met she's losing her balance to the left. She notices it  When she walks. It can be any distance, short or far. It's happenign in the house as well as out in the open.  I asked her if there were any changes in her medications and she denies.  Denies any new problems with pain or focal weakness.  She has chronic right-sided sensory loss due to her back and some weakness on the left leg due to her back which has not changed.  She denies any cold or flu symptoms.  She does report some ongoing diplopia which is been chronic and if anything has been better.  She denies headache or focal upper extremity weakness.  She denies any problems with swallowing nausea or vomiting.  I asked her she had talked about this with anybody and she told me know.  She has been using her straight cane for balance.  She denies any falls recently since we last met.    Pain Inventory Average Pain 4 Pain Right Now 4 My pain is intermittent, sharp and stabbing  In the last 24 hours, has pain interfered with the following? General activity 5 Relation with others 5 Enjoyment of life 5 What TIME of day is your pain at its worst? daytime Sleep (in general) Fair  Pain is worse with: walking, sitting and standing Pain improves with: heat/ice and medication Relief from Meds: 5  Mobility use a cane how many minutes can you walk? 20 ability to climb steps?  no  Function Do you have any goals in this area?  no  Neuro/Psych weakness numbness trouble walking  Prior Studies Any changes since last visit?  no  Physicians involved in your care Any changes since last visit?  no   Family History  Problem Relation Age of Onset  . Diabetes Mother   . Heart disease Mother   . Stroke Mother   . Heart disease  Father   . Heart disease Brother    Social History   Socioeconomic History  . Marital status: Widowed    Spouse name: Not on file  . Number of children: Not on file  . Years of education: Not on file  . Highest education level: Not on file  Occupational History  . Not on file  Social Needs  . Financial resource strain: Not on file  . Food insecurity    Worry: Not on file    Inability: Not on file  . Transportation needs    Medical: Not on file    Non-medical: Not on file  Tobacco Use  . Smoking status: Former Smoker    Packs/day: 0.50    Years: 5.00    Pack years: 2.50    Types: Cigarettes    Quit date: 03/15/1968    Years since quitting: 50.7  . Smokeless tobacco: Never Used  Substance and Sexual Activity  . Alcohol use: Not Currently    Alcohol/week: 1.0 standard drinks    Types: 1 Glasses of wine per week    Comment: occasional glass of wine  . Drug use: No  . Sexual activity: Not on file  Lifestyle  . Physical activity    Days per week: Not on  file    Minutes per session: Not on file  . Stress: Not on file  Relationships  . Social Herbalist on phone: Not on file    Gets together: Not on file    Attends religious service: Not on file    Active member of club or organization: Not on file    Attends meetings of clubs or organizations: Not on file    Relationship status: Not on file  Other Topics Concern  . Not on file  Social History Narrative  . Not on file   Past Surgical History:  Procedure Laterality Date  . ABDOMINAL HYSTERECTOMY    . BACK SURGERY     2011  . BLADDER SURGERY  1985  . CESAREAN SECTION     x2  . RETINAL DETACHMENT SURGERY  9/13  . TRANSFORAMINAL LUMBAR INTERBODY FUSION W/ MIS 1 LEVEL Right 04/26/2018   Procedure: Right Lumbar four-five Minimally invasive Transforaminal lumbar interbody fusion;  Surgeon: Judith Part, MD;  Location: [place];  Service: Neurosurgery;  Laterality: Right;   Past Medical History:   Diagnosis Date  . Cervical facet syndrome   . Depression   . Disorders of sacrum   . Enthesopathy of hip region   . GERD (gastroesophageal reflux disease)   . Headache   . History of kidney stones   . Hypertension   . Sciatic neuritis   . Synovitis and tenosynovitis   . Vision abnormalities    BP 119/79   Pulse 81   Temp 97.7 F (36.5 C)   Ht 5' 3"  (1.6 m)   Wt 131 lb (59.4 kg)   SpO2 94%   BMI 23.21 kg/m   Opioid Risk Score:   Fall Risk Score:  `1  Depression screen PHQ 2/9  Depression screen [facility] 2/9 01/25/2018 10/12/2017 06/03/2017 04/08/2017 10/22/2015 05/23/2015 03/20/2015  Decreased Interest 0 0 0 0 0 0 0  Down, Depressed, Hopeless 0 0 0 0 0 0 0  PHQ - 2 Score 0 0 0 0 0 0 0  Altered sleeping - - 0 - - 0 -  Tired, decreased energy - - 0 - - 3 -  Change in appetite - - 0 - - 3 -  Feeling bad or failure about yourself  - - 0 - - 0 -  Trouble concentrating - - 0 - - 0 -  Moving slowly or fidgety/restless - - 0 - - 0 -  Suicidal thoughts - - 0 - - 0 -  PHQ-9 Score - - 0 - - 6 -  Some recent data might be hidden    Review of Systems  Constitutional: Negative.   HENT: Negative.   Eyes: Negative.   Respiratory: Negative.   Cardiovascular: Negative.   Gastrointestinal: Negative.   Endocrine: Negative.   Genitourinary: Negative.   Musculoskeletal: Negative.   Skin: Negative.   Allergic/Immunologic: Negative.   Neurological: Positive for numbness.  Hematological: Negative.   Psychiatric/Behavioral: Negative.   All other systems reviewed and are negative.      Objective:   Physical Exam General: No acute distress HEENT: EOMI, oral membranes moist Cards: reg rate  Chest: normal effort Abdomen: Soft, NT, ND Skin: dry, intact Extremities: no edema  Neurological: Patient is alert and oriented x3 with normal insight and awareness.  On cranial nerve testing she has no focal deficits other than some nystagmus with lateral gaze right and left.  She denies any double  vision or vertigo with  movements of the eyes or head.  We did perform a Romberg test and it was immediately positive with a lean and fall to the left side.  Patient had no focal limb ataxia.  Strength was 5 out of 5 in the upper extremities except for some pain in the shoulders.  Lower extremities she is a least 4 to 4+ out of 5 bilaterally.  His ongoing sensory loss in the right leg.  Reflexes are 1+.  I had her ambulate today and when she stood up she lean to the left and then when she ambulated she listed quickly to the left even with using her cane.  She was able to right herself with using extra time and slowing her speed down.   Psychiatric:pleasant .         1.Chronic low back pain with lumbar postlaminectomy syndrome. Recent fall with new L1 compression fracture.: -had good results with her L4-5 fusion/decompression/scar tissue removal by NS -continue fentanyl patch44mgevery 72 hours, #10. ContinuePercocet 10/325 #75.  -We will continue the controlled substance monitoring program, this consists of regular clinic visits, examinations, routine drug screening, pill [place].   2. Neuropathic Pain/right sided lumbar radicular pain, L4---improved after surgery -some residual numbness in the right leg.  --orthotics, appropriate shoe wear for foot drop, EHL weakness 3. Gait Disorder: Patient with worsening left-sided truncal ataxia today which is new.  She does have a history of small vessel stroke.  She is currently not on any anti-platelet or anticoagulant medications.  -Given this change I set her up for an MRI ASAP to assess her for potential small vessel infarct.  Remainder of her physical exam was unremarkable today.  The symptoms have been going on for several weeks now apparently.  She has had long-term left-sided leg weakness with foot drop but  this is new and more truncal and appearance. -In the meantime I asked her to use her walker for all ambulation.  She is to call me if any symptoms change.  She has any worsening neurological symptomatology she was advised to go to the emergency room. 4. Myoclonus with sleep: Continue Klonopin 5.Lumbar facet arthropathy:had previous results with MBB's.Any further injections are on the back burner given her new findings. 6.Osteoarthritis of hands. Voltaren gel 3 times daily.  7. Right shoulderandleft shoulder pain, left biceps tendonitis -both have subsided for now. Needs to continue with ROM and strenghening as tolerated.   25 minutes of face to face patient care time were spent during this visit. All questions were encouraged and answered.  Follow-up with me in about a month

## 2018-11-30 ENCOUNTER — Encounter: Payer: Medicare HMO | Admitting: Physical Therapy

## 2018-12-01 ENCOUNTER — Ambulatory Visit (HOSPITAL_COMMUNITY)
Admission: RE | Admit: 2018-12-01 | Discharge: 2018-12-01 | Disposition: A | Discharge status: Home / Self Care | Payer: Medicare HMO | Source: Ambulatory Visit | Attending: Physical Medicine & Rehabilitation | Admitting: Physical Medicine & Rehabilitation

## 2018-12-01 ENCOUNTER — Other Ambulatory Visit: Payer: Self-pay | Admitting: Physical Medicine & Rehabilitation

## 2018-12-01 ENCOUNTER — Other Ambulatory Visit: Payer: Self-pay

## 2018-12-01 DIAGNOSIS — R29818 Other symptoms and signs involving the nervous system: Secondary | ICD-10-CM | POA: Insufficient documentation

## 2018-12-01 DIAGNOSIS — G253 Myoclonus: Secondary | ICD-10-CM

## 2018-12-01 DIAGNOSIS — R42 Dizziness and giddiness: Secondary | ICD-10-CM | POA: Diagnosis not present

## 2018-12-02 ENCOUNTER — Other Ambulatory Visit: Payer: Self-pay | Admitting: Physical Medicine & Rehabilitation

## 2018-12-02 ENCOUNTER — Encounter: Payer: Medicare HMO | Admitting: Physical Therapy

## 2018-12-02 DIAGNOSIS — G253 Myoclonus: Secondary | ICD-10-CM

## 2018-12-06 ENCOUNTER — Telehealth: Payer: Self-pay

## 2018-12-06 DIAGNOSIS — H8112 Benign paroxysmal vertigo, left ear: Secondary | ICD-10-CM

## 2018-12-06 NOTE — Telephone Encounter (Signed)
Patient called and left message requesting the results of a recent MRI of her head/spinal area.

## 2018-12-07 ENCOUNTER — Encounter: Payer: Medicare HMO | Admitting: Physical Therapy

## 2018-12-07 MED ORDER — MECLIZINE HCL 25 MG PO TABS: 25.0000 mg | ORAL_TABLET | Freq: Three times a day (TID) | ORAL | 3 refills | Status: DC | PRN

## 2018-12-07 NOTE — Telephone Encounter (Signed)
Called and spoke to [place]. Reviewed MRI which did not show any acute changes. She feels that symptoms are worsening and describes vertigo now with this loss of balance. Symptoms seem suspicious for BPPV especially in the setting of an unremarkable MRI.   Have made a referral to [facility] for vestibular assessment and treat. Rx for meclizine 25mg  q8prn short term for dizziness.

## 2018-12-08 ENCOUNTER — Other Ambulatory Visit: Payer: Self-pay

## 2018-12-08 DIAGNOSIS — Z20822 Contact with and (suspected) exposure to covid-19: Secondary | ICD-10-CM

## 2018-12-08 DIAGNOSIS — Z20828 Contact with and (suspected) exposure to other viral communicable diseases: Secondary | ICD-10-CM | POA: Diagnosis not present

## 2018-12-09 ENCOUNTER — Encounter: Payer: Medicare HMO | Admitting: Physical Therapy

## 2018-12-09 LAB — NOVEL CORONAVIRUS, NAA: SARS-CoV-2, NAA: NOT DETECTED

## 2018-12-16 DIAGNOSIS — Z0000 Encounter for general adult medical examination without abnormal findings: Secondary | ICD-10-CM | POA: Diagnosis not present

## 2018-12-16 DIAGNOSIS — E782 Mixed hyperlipidemia: Secondary | ICD-10-CM | POA: Diagnosis not present

## 2018-12-16 DIAGNOSIS — I10 Essential (primary) hypertension: Secondary | ICD-10-CM | POA: Diagnosis not present

## 2018-12-22 ENCOUNTER — Encounter: Payer: Medicare HMO | Attending: Physical Medicine and Rehabilitation | Admitting: Physical Medicine & Rehabilitation

## 2018-12-22 ENCOUNTER — Other Ambulatory Visit: Payer: Self-pay

## 2018-12-22 ENCOUNTER — Encounter: Payer: Self-pay | Admitting: Physical Medicine & Rehabilitation

## 2018-12-22 VITALS — BP 109/66 | HR 94 | Temp 97.5°F | Ht 63.0 in | Wt 134.0 lb

## 2018-12-22 DIAGNOSIS — M961 Postlaminectomy syndrome, not elsewhere classified: Secondary | ICD-10-CM

## 2018-12-22 DIAGNOSIS — M7522 Bicipital tendinitis, left shoulder: Secondary | ICD-10-CM | POA: Diagnosis not present

## 2018-12-22 DIAGNOSIS — M2170 Unequal limb length (acquired), unspecified site: Secondary | ICD-10-CM | POA: Insufficient documentation

## 2018-12-22 DIAGNOSIS — M47816 Spondylosis without myelopathy or radiculopathy, lumbar region: Secondary | ICD-10-CM | POA: Diagnosis not present

## 2018-12-22 DIAGNOSIS — H8113 Benign paroxysmal vertigo, bilateral: Secondary | ICD-10-CM

## 2018-12-22 DIAGNOSIS — M5416 Radiculopathy, lumbar region: Secondary | ICD-10-CM | POA: Diagnosis not present

## 2018-12-22 DIAGNOSIS — M533 Sacrococcygeal disorders, not elsewhere classified: Secondary | ICD-10-CM | POA: Diagnosis not present

## 2018-12-22 DIAGNOSIS — G894 Chronic pain syndrome: Secondary | ICD-10-CM

## 2018-12-22 DIAGNOSIS — Z79891 Long term (current) use of opiate analgesic: Secondary | ICD-10-CM

## 2018-12-22 DIAGNOSIS — Z79899 Other long term (current) drug therapy: Secondary | ICD-10-CM | POA: Diagnosis not present

## 2018-12-22 DIAGNOSIS — Z5181 Encounter for therapeutic drug level monitoring: Secondary | ICD-10-CM | POA: Diagnosis not present

## 2018-12-22 MED ORDER — FENTANYL 25 MCG/HR TD PT72: 1.0000 | MEDICATED_PATCH | TRANSDERMAL | 0 refills | Status: DC

## 2018-12-22 MED ORDER — OXYCODONE-ACETAMINOPHEN 10-325 MG PO TABS: 1.0000 | ORAL_TABLET | Freq: Three times a day (TID) | ORAL | 0 refills | Status: DC | PRN

## 2018-12-22 NOTE — Patient Instructions (Signed)
PLEASE FEEL FREE TO CALL OUR OFFICE WITH ANY PROBLEMS OR QUESTIONS ([phone])      

## 2018-12-22 NOTE — Addendum Note (Signed)
Addended by: Jasmine December T on: 12/22/2018 12:17 PM   Modules accepted: Orders

## 2018-12-22 NOTE — Progress Notes (Signed)
Subjective:    Patient ID: Alexandria Mclaughlin, female    DOB: Apr 06, 1940, 78 y.o.   MRN: [id number]  HPI   Alexandria Mclaughlin is back regarding her chronic pain and gait issues.  After her last visit we sent her for an MRI of her brain which did not show any acute changes.  After talking to her on the phone we thought this may be BPPV.  I gave her a prescription for meclizine which is helped with her "dizziness".  She still has problems with loss of balance when she stands especially towards the left side.  She uses a rolling walker which helps steady her quite a bit.  She has chronic double vision which is not changed.  She does report generally increased difficulties with her penmanship although this has been evolving chronically.  She denies any nausea or vomiting.  She does feel that her right leg is a bit more numb than it had been previously.  Pain levels are stable to improved there have been no other medication regimen changes  Pain Inventory Average Pain 4 Pain Right Now 3 My pain is intermittent, sharp and stabbing  In the last 24 hours, has pain interfered with the following? General activity 5 Relation with others 5 Enjoyment of life 6 What TIME of day is your pain at its worst? daytime Sleep (in general) Good  Pain is worse with: bending, sitting and standing Pain improves with: rest, heat/ice and medication Relief from Meds: 5  Mobility use a cane use a walker ability to climb steps?  yes do you drive?  yes  Function Do you have any goals in this area?  no  Neuro/Psych numbness trouble walking  Prior Studies Any changes since last visit?  no  Physicians involved in your care Any changes since last visit?  no   Family History  Problem Relation Age of Onset  . Diabetes Mother   . Heart disease Mother   . Stroke Mother   . Heart disease Father   . Heart disease Brother    Social History   Socioeconomic History  . Marital status: Widowed    Spouse name: Not on file  .  Number of children: Not on file  . Years of education: Not on file  . Highest education level: Not on file  Occupational History  . Not on file  Social Needs  . Financial resource strain: Not on file  . Food insecurity    Worry: Not on file    Inability: Not on file  . Transportation needs    Medical: Not on file    Non-medical: Not on file  Tobacco Use  . Smoking status: Former Smoker    Packs/day: 0.50    Years: 5.00    Pack years: 2.50    Types: Cigarettes    Quit date: 03/15/1968    Years since quitting: 50.8  . Smokeless tobacco: Never Used  Substance and Sexual Activity  . Alcohol use: Not Currently    Alcohol/week: 1.0 standard drinks    Types: 1 Glasses of wine per week    Comment: occasional glass of wine  . Drug use: No  . Sexual activity: Not on file  Lifestyle  . Physical activity    Days per week: Not on file    Minutes per session: Not on file  . Stress: Not on file  Relationships  . Social Herbalist on phone: Not on file    Gets  together: Not on file    Attends religious service: Not on file    Active member of club or organization: Not on file    Attends meetings of clubs or organizations: Not on file    Relationship status: Not on file  Other Topics Concern  . Not on file  Social History Narrative  . Not on file   Past Surgical History:  Procedure Laterality Date  . ABDOMINAL HYSTERECTOMY    . BACK SURGERY     2011  . BLADDER SURGERY  1985  . CESAREAN SECTION     x2  . RETINAL DETACHMENT SURGERY  9/13  . TRANSFORAMINAL LUMBAR INTERBODY FUSION W/ MIS 1 LEVEL Right 04/26/2018   Procedure: Right Lumbar four-five Minimally invasive Transforaminal lumbar interbody fusion;  Surgeon: Judith Part, MD;  Location: [facility];  Service: Neurosurgery;  Laterality: Right;   Past Medical History:  Diagnosis Date  . Cervical facet syndrome   . Depression   . Disorders of sacrum   . Enthesopathy of hip region   . GERD (gastroesophageal  reflux disease)   . Headache   . History of kidney stones   . Hypertension   . Sciatic neuritis   . Synovitis and tenosynovitis   . Vision abnormalities    There were no vitals taken for this visit.  Opioid Risk Score:   Fall Risk Score:  `1  Depression screen PHQ 2/9  Depression screen [facility] 2/9 01/25/2018 10/12/2017 06/03/2017 04/08/2017 10/22/2015 05/23/2015 03/20/2015  Decreased Interest 0 0 0 0 0 0 0  Down, Depressed, Hopeless 0 0 0 0 0 0 0  PHQ - 2 Score 0 0 0 0 0 0 0  Altered sleeping - - 0 - - 0 -  Tired, decreased energy - - 0 - - 3 -  Change in appetite - - 0 - - 3 -  Feeling bad or failure about yourself  - - 0 - - 0 -  Trouble concentrating - - 0 - - 0 -  Moving slowly or fidgety/restless - - 0 - - 0 -  Suicidal thoughts - - 0 - - 0 -  PHQ-9 Score - - 0 - - 6 -  Some recent data might be hidden  '  Review of Systems  Constitutional: Negative.   HENT: Negative.   Eyes: Negative.   Respiratory: Negative.   Cardiovascular: Negative.   Gastrointestinal: Negative.   Endocrine: Negative.   Genitourinary: Negative.   Musculoskeletal: Positive for arthralgias and gait problem.  Skin: Negative.   Allergic/Immunologic: Negative.   Neurological: Positive for numbness.  Hematological: Negative.   Psychiatric/Behavioral: Negative.   All other systems reviewed and are negative.      Objective:   Physical Exam  General: No acute distress HEENT: EOMI, oral membranes moist Cards: reg rate  Chest: normal effort Abdomen: Soft, NT, ND Skin: dry, intact Extremities: no edema  Neurological: Patient is alert and oriented x3 with normal insight and awareness.  Speech is clear.  No language issues.  Patient with persistent nystagmus with right and left lateral gaze.  Mild diplopia.  She stands up from a seated position and loses her balance posteriorly and to the left.  She is much more stable with her walker.  She has fine tremors of both hands.  No gross limb ataxia  cognitively she is appropriate.  He denies any double vision or vertigo with movements of the eyes or head.  Strength 4-5 out of 5 in all  4 limbs.  Mild sensory loss in the right lower leg and foot Psych: Affect is pleasant and appropriate      1.Chronic low back pain with lumbar postlaminectomy syndrome. Recent fall with new L1 compression fracture.: -had good results with her L4-5 fusion/decompression/scar tissue removal by NS -continue fentanyl patch39mcgevery 72 hours, #10. ContinuePercocet 10/325 #75.  -We will continue the controlled substance monitoring program, this consists of regular clinic visits, examinations, routine drug screening, pill counts as well as use of [place] Controlled Sub[place] Reporting System. [place].   -UDS today.   2. Neuropathic Pain/right sided lumbar radicular pain, L4---improved after surgery -some residual numbness in the right leg which waxes and wanes --orthotics, appropriate shoe wear for foot drop, EHL weakness 3. Gait Disorder: Patient with worsening left-sided truncal ataxia today which is new.  She does have a history of small vessel stroke.  She is currently not on any anti-platelet or anticoagulant medications.          -MRI without acute changes.  -Patient with some positive response with meclizine  -We will make a referral to outpatient neuro rehab for vestibular assessment questional positional vertigo.  -If symptoms worsen or no progress with therapies, we will have to consider neurology consult. 4. Myoclonus with sleep: Continue Klonopin 5.Lumbar facet arthropathy:had previous results with MBB's.Any further injections are on the back burner given her new findings. 6.Osteoarthritis of hands. Voltaren gel 3 times daily.  7. Right shoulderandleft shoulder pain, left biceps tendonitis -both are improved currently. Needs to continue with ROM and  strenghening as tolerated.   15 minutes of time was spent with the patient in the office today.  I will see her back tentatively for about 1 month from now.  She is asked to call me with any problems or worsening of her symptoms.

## 2018-12-23 DIAGNOSIS — R269 Unspecified abnormalities of gait and mobility: Secondary | ICD-10-CM | POA: Diagnosis not present

## 2018-12-23 DIAGNOSIS — G894 Chronic pain syndrome: Secondary | ICD-10-CM | POA: Diagnosis not present

## 2018-12-23 DIAGNOSIS — I10 Essential (primary) hypertension: Secondary | ICD-10-CM | POA: Diagnosis not present

## 2018-12-23 DIAGNOSIS — Z7189 Other specified counseling: Secondary | ICD-10-CM | POA: Diagnosis not present

## 2018-12-23 DIAGNOSIS — I700 Atherosclerosis of aorta: Secondary | ICD-10-CM | POA: Diagnosis not present

## 2018-12-23 DIAGNOSIS — E782 Mixed hyperlipidemia: Secondary | ICD-10-CM | POA: Diagnosis not present

## 2018-12-26 LAB — DRUG TOX MONITOR 1 W/CONF, ORAL FLD
Amphetamines: NEGATIVE ng/mL (ref <10)
Barbiturates: NEGATIVE ng/mL (ref <10)
Benzodiazepines: NEGATIVE ng/mL (ref <0.50)
Buprenorphine: NEGATIVE ng/mL (ref <0.10)
Cocaine: NEGATIVE ng/mL (ref <5.0)
Codeine: NEGATIVE ng/mL (ref <2.5)
Dihydrocodeine: NEGATIVE ng/mL (ref <2.5)
Fentanyl: 2.54 ng/mL — ABNORMAL HIGH (ref <0.10)
Fentanyl: POSITIVE ng/mL — AB (ref <0.10)
Heroin Metabolite: NEGATIVE ng/mL (ref <1.0)
Hydrocodone: NEGATIVE ng/mL (ref <2.5)
Hydromorphone: NEGATIVE ng/mL (ref <2.5)
MARIJUANA: NEGATIVE ng/mL (ref <2.5)
MDMA: NEGATIVE ng/mL (ref <10)
Meprobamate: NEGATIVE ng/mL (ref <2.5)
Methadone: NEGATIVE ng/mL (ref <5.0)
Morphine: NEGATIVE ng/mL (ref <2.5)
Nicotine Metabolite: NEGATIVE ng/mL (ref <5.0)
Norhydrocodone: NEGATIVE ng/mL (ref <2.5)
Noroxycodone: 27.1 ng/mL — ABNORMAL HIGH (ref <2.5)
Opiates: POSITIVE ng/mL — AB (ref <2.5)
Oxycodone: >250 ng/mL — ABNORMAL HIGH (ref <2.5)
Oxymorphone: NEGATIVE ng/mL (ref <2.5)
Phencyclidine: NEGATIVE ng/mL (ref <10)
Tapentadol: NEGATIVE ng/mL (ref <5.0)
Tramadol: NEGATIVE ng/mL (ref <5.0)
Zolpidem: NEGATIVE ng/mL (ref <5.0)

## 2018-12-26 LAB — DRUG TOX ALC METAB W/CON, ORAL FLD: Alcohol Metabolite: NEGATIVE ng/mL (ref <25)

## 2018-12-27 ENCOUNTER — Telehealth: Payer: Self-pay | Admitting: *Deleted

## 2018-12-27 NOTE — Telephone Encounter (Signed)
Oral swab drug screen was consistent for prescribed medications.  ?

## 2018-12-27 NOTE — Telephone Encounter (Signed)
Please f/u with Neuro[facility]rehab re: status of referral  thx

## 2019-01-07 ENCOUNTER — Other Ambulatory Visit: Payer: Self-pay

## 2019-01-07 ENCOUNTER — Ambulatory Visit: Payer: Medicare HMO | Attending: Physical Medicine & Rehabilitation

## 2019-01-07 DIAGNOSIS — R2681 Unsteadiness on feet: Secondary | ICD-10-CM | POA: Diagnosis present

## 2019-01-07 DIAGNOSIS — R208 Other disturbances of skin sensation: Secondary | ICD-10-CM

## 2019-01-07 DIAGNOSIS — R2689 Other abnormalities of gait and mobility: Secondary | ICD-10-CM | POA: Diagnosis present

## 2019-01-07 DIAGNOSIS — H8113 Benign paroxysmal vertigo, bilateral: Secondary | ICD-10-CM | POA: Insufficient documentation

## 2019-01-07 DIAGNOSIS — R42 Dizziness and giddiness: Secondary | ICD-10-CM | POA: Diagnosis present

## 2019-01-07 DIAGNOSIS — R209 Unspecified disturbances of skin sensation: Secondary | ICD-10-CM | POA: Insufficient documentation

## 2019-01-07 NOTE — Therapy (Signed)
[facility] [address] [place], [place], [address] Phone: [phone]   Fax:  [phone]  Physical Therapy Evaluation  Patient Details  Name: Alexandria Mclaughlin MRN: [id number] Date of Birth: 31-Jul-1940 Referring Provider (PT): Dr. Alger Simons   Encounter Date: 01/07/2019  PT End of Session - 01/07/19 1507    Visit Number  1    Number of Visits  17    Date for PT Re-Evaluation  04/07/19   to accommodate schedule wait time   Authorization Type  [employer]etna Medicare (progress note every 10th visit)    PT Start Time  1221    PT Stop Time  1317    PT Time Calculation (min)  56 min    Equipment Utilized During Treatment  Other (comment)   min gaurd to minA   Activity Tolerance  Patient tolerated treatment well    Behavior During Therapy  [facility] for tasks assessed/performed       Past Medical History:  Diagnosis Date  . Cervical facet syndrome   . Depression   . Disorders of sacrum   . Enthesopathy of hip region   . GERD (gastroesophageal reflux disease)   . Headache   . History of kidney stones   . Hypertension   . Sciatic neuritis   . Synovitis and tenosynovitis   . Vision abnormalities     Past Surgical History:  Procedure Laterality Date  . ABDOMINAL HYSTERECTOMY    . BACK SURGERY     2011  . BLADDER SURGERY  1985  . CESAREAN SECTION     x2  . RETINAL DETACHMENT SURGERY  9/13  . TRANSFORAMINAL LUMBAR INTERBODY FUSION W/ MIS 1 LEVEL Right 04/26/2018   Procedure: Right Lumbar four-five Minimally invasive Transforaminal lumbar interbody fusion;  Surgeon: Judith Part, MD;  Location: [facility];  Service: Neurosurgery;  Laterality: Right;    There were no vitals filed for this visit.   Subjective Assessment - 01/07/19 1230    Subjective  Pt reported dizziness (unsteadiness/losing balance/wooziness) began a few months ago and has been getting worse. Pt with recent Lumbar laminectomy (3/20-denied precautions).  Pt reports she stumbles to the L side when standing up from chair, and is unable to walk straight, must use RW (or cane) to cease swaying in different directions. Pt has hx of vertigo and stated this does not feel the same. Pt denied N/T, weakness, severe HA, tinnitus or LOH along with the dizziness. However, pt does have hx of N/T in RLE (hx of Lumbar radiculopathy)-N/T from knee distal to R foot. Pt denied falls in the last six months, she's had near falls but has caught herself. Pt denied recent illness, MVA, hitting head. After positional testing, pt described dizziness as spinning sensation. MRI of brain (-).    Pertinent History  Osteoporosis, HTN, Hx of Lumbar radiculopathy and facet arthropathy-s/p Lumbar laminectomy 04/26/2018, hx of L1 compression fx 03/2018, L rotator cuff and bicep tendonitis, hx of diplopia-pt's ophthalmologist is aware, hx of R retinal detachment (had surgery for this 10/2011), hx of TIA per pt, hx of LLD, HLD, depression, HA, arthritis, Cervical facet syndrome    Patient Stated Goals  To be able to walk straight without using an AD (has been using RW, SPC since 3/20 surgery)    Currently in Pain?  Yes    Pain Score  3     Pain Location  Hip    Pain Orientation  Right    Pain  Descriptors / Indicators  Aching    Pain Type  Chronic pain    Pain Radiating Towards  --   laminectomy ceased pain radiating down RLE   Pain Onset  More than a month ago    Pain Frequency  Constant    Aggravating Factors   sitting or standing or walking longer distances    Pain Relieving Factors  medication         [facility] PT Assessment - 01/07/19 1239      Assessment   Medical Diagnosis  BPPV due to B vestibular disorder    Referring Provider (PT)  Dr. Alger Simons    Onset Date/Surgical Date  10/07/18   approx. 3 months ago   Hand Dominance  Right    Prior Therapy  none      Precautions   Precautions  Fall;Other (comment)   will check for back precautions   Precaution Comments  L1  compression fx in 04/15/18 but is healed      Restrictions   Weight Bearing Restrictions  No      Balance Screen   Has the patient fallen in the past 6 months  No    Has the patient had a decrease in activity level because of a fear of falling?   Yes    Is the patient reluctant to leave their home because of a fear of falling?   Yes      [place] residence   [place]  Alone    Available Help at Discharge  Family;Friend(s);Available 24 hours/day    Type of [place] Access  Level entry    [place]  One level   elevator to reach condo (level 2)   [place] - 2 wheels;Cane - single point;Cane - quad;Shower seat - built in;Grab bars - tub/shower   walk in tub, raised toilet     Prior Function   Level of Independence  Independent    Vocation  Retired    Leisure  Going to [employer], concerts, theater, walk in the park      Cognition   Overall Cognitive Status  Within Functional Limits for tasks assessed   pt reports some memory issues     Sensation   Additional Comments  Pt reported N/T in RLE (knee traveling distal to R foot)      Ambulation/Gait   Ambulation/Gait  Yes    Ambulation/Gait Assistance  4: Min assist;4: Min guard    Ambulation/Gait Assistance Details  Pt noted to veer to the L and R sides, no real pattern noted to why pt started to veer. Cues for sequencing with RW during turns and gait, cues for improved heel strike (R>L).    Ambulation Distance (Feet)  120 Feet    Assistive device  Rolling walker    Gait Pattern  Step-to pattern;Decreased stride length;Decreased dorsiflexion - right;Shuffle    Ambulation Surface  Level;Indoor    Gait velocity  0.53f/sec with RW           Vestibular Assessment - 01/07/19 1253      Vestibular Assessment   General Observation  Pt has hx of diplopia and is being followed by ophthalmalogist-needs new glasses.      Symptom Behavior    Subjective history of current problem  See subjective assessment    Type of Dizziness   Unsteady with head/body turns  Frequency of Dizziness  Daily    Duration of Dizziness  Several seconds during supine to sit txfs and then constant when amb.    Symptom Nature  Variable    Aggravating Factors  Supine to sit;Sit to stand    Relieving Factors  Slow movements;Rest   rest during STS txfs, stops movements when amb.    Progression of Symptoms  Worse      Oculomotor Exam   Oculomotor Alignment  Normal   but pt carries head with slight R Cx sidebend   Spontaneous  Absent    Gaze-induced   Absent    Smooth Pursuits  Intact   reports intermittent double vision   Saccades  Intact    Comment  Convergence intact. Unable to perform B HIT as pt guarded.      Vestibulo-Ocular Reflex   VOR 1 Head Only (x 1 viewing)  WNL, no dizziness reported.    VOR Cancellation  Normal      Positional Testing   Dix-Hallpike  Dix-Hallpike Right;Dix-Hallpike Left    Horizontal Canal Testing  Horizontal Canal Right;Horizontal Canal Left      Dix-Hallpike Right   Dix-Hallpike Right Duration  45 sec. with 7/10 dizziness.    Dix-Hallpike Right Symptoms  Upbeat, right rotatory nystagmus;Other (comment)   challening to determine direction, amplitude small     Dix-Hallpike Left   Dix-Hallpike Left Duration  <30 sec. with 4/10 dizziness.    pt noted to experience post. LOB upon sitting upright-mod A   Dix-Hallpike Left Symptoms  Upbeat, left rotatory nystagmus      Horizontal Canal Right   Horizontal Canal Right Duration  0    Horizontal Canal Right Symptoms  Normal      Horizontal Canal Left   Horizontal Canal Left Duration  0    Horizontal Canal Left Symptoms  Normal          Objective measurements completed on examination: See above findings.       Vestibular Treatment/Exercise - 01/07/19 1326      Vestibular Treatment/Exercise   Vestibular Treatment Provided  Canalith Repositioning     Canalith Repositioning  Semont Procedure Right Posterior      Semont Procedure Right Posterior   Number of Reps   1    Overall Response   Improved Symptoms    Response Details   Pt reported improved dizziness.             PT Education - 01/07/19 1506    Education Details  PT educated pt on exam findings (BPPV), PT POC, frequency and duration. Pt reported she's taking Meclizine but it does not help dizziness, PT educated pt on how Meclizine can suppress the response during testing/treatment.    Person(s) Educated  Patient    Methods  Explanation    Comprehension  Verbalized understanding       PT Short Term Goals - 01/07/19 1528      PT SHORT TERM GOAL #1   Title  Pt will be IND with HEP to improve dizziness, strength, and balance. TARGET DATE FOR ALL STGS: 02/04/19    Time  4    Period  Weeks    Status  New      PT SHORT TERM GOAL #2   Title  Perform MMT, BERG/DGI and write goals as indicated.    Time  4    Period  Weeks    Status  New      PT SHORT TERM GOAL #  3   Title  Pt will report dizziness </=3/10 during all positional testing to improve safety during ADLs and QOL.    Time  4    Period  Weeks    Status  New      PT SHORT TERM GOAL #4   Title  Pt will improve gait speed to >/=1.90f/sec with LRAD to decr. falls risk.    Time  4    Period  Weeks    Status  New      PT SHORT TERM GOAL #5   Title  Pt will amb. 200' over even terrain with LRAD and S to improve functional mobility.    Time  4    Period  Weeks    Status  New        PT Long Term Goals - 01/07/19 1530      PT LONG TERM GOAL #1   Title  Pt will report dizziness </=1/10 during all functional mobility and positional testing to improve safety during functional mobility. TARGET DATE FOR ALL LTGS: 03/03/18    Time  8    Period  Weeks    Status  New      PT LONG TERM GOAL #2   Title  Pt will amb. 400' over even/paved surfaces with LRAD at MOD I level to improve functional mobility.    Time  8     Period  Weeks    Status  New      PT LONG TERM GOAL #3   Title  Pt will improve gait speed to >/=1.869fsec with LRAD to decr. falls risk.    Time  8    Period  Weeks    Status  New             Plan - 01/07/19 1508    Clinical Impression Statement  Pt is a 78y/o female presenting to [facility] neuro for dizziness. [name] PMH significant for the following: HTN, Hx of Lumbar radiculopathy and facet arthropathy-s/p Lumbar laminectomy 04/26/2018, hx of L1 compression fx 03/2018, L rotator cuff and bicep tendonitis, hx of diplopia-pt's ophthalmologist is aware, hx of R retinal detachment (had surgery for this 10/2011), hx of TIA per pt, hx of LLD, HLD, depression, HA, arthritis, Cervical facet syndrome. [name] s/s consistent with B pBPPV, as she experience upbeating rotary nystagmus and concordant dizziness during R and L Dix-hallpike testing. R sided dizziness > L sided dizziness, therefore, PT treated R side today with pt reported improved s/s. The follow deficits were noted upon exam: dizziness, gait deviations, impaired balance, strength not formally assessed but weakness suspected 2/2 gait deviations, impaired sensation of RLE. [name] gait speed indicated pt is at high risk for falls. PT will formally assess balance next session. Pt would benefit from skilled PT to improve safety and reduce dizziness during functional mobility.    Personal Factors and Comorbidities  Age;Comorbidity 3+;Behavior Pattern    Comorbidities  Osteoporosis, HTN, Hx of Lumbar radiculopathy and facet arthropathy-s/p Lumbar laminectomy 04/26/2018, hx of L1 compression fx 03/2018, L rotator cuff and bicep tendonitis, hx of diplopia-pt's ophthalmologist is aware, hx of R retinal detachment (had surgery for this 10/2011), hx of TIA per pt, hx of LLD, HLD, depression, HA, arthritis, Cervical facet syndrome    Examination-Activity Limitations  Bathing;Locomotion Level;Transfers;Bed Mobility;Carry;Squat;Stairs;Stand;Lift;Bend     Examination-Participation Restrictions  Driving;Yard Work;Community Activity    Stability/Clinical Decision Making  Evolving/Moderate complexity    Clinical Decision Making  Moderate    Rehab Potential  Good    PT Frequency  2x / week    PT Duration  8 weeks    PT Treatment/Interventions  ADLs/Self Care Home Management;Biofeedback;Canalith Repostioning;DME Instruction;Balance training;Therapeutic exercise;Therapeutic activities;Patient/family education;Functional mobility training;Stair training;Gait training;Neuromuscular re-education;Vestibular    PT Next Visit Plan  Reassess for B pBPPV and treat as indicated. Perform MMT, BERG and DGI-write goals as indicated. Initiate HEP for LE strength and balance.    Consulted and Agree with Plan of Care  Patient       Patient will benefit from skilled therapeutic intervention in order to improve the following deficits and impairments:  Abnormal gait, Decreased range of motion, Dizziness, Decreased mobility, Impaired sensation, Postural dysfunction, Decreased balance, Decreased knowledge of use of DME, Impaired flexibility  Visit Diagnosis: BPPV (benign paroxysmal positional vertigo), bilateral - Plan: PT plan of care cert/re-cert  Dizziness and giddiness - Plan: PT plan of care cert/re-cert  Other abnormalities of gait and mobility - Plan: PT plan of care cert/re-cert  Other disturbances of skin sensation - Plan: PT plan of care cert/re-cert  Unsteadiness on feet - Plan: PT plan of care cert/re-cert     Problem List Patient Active Problem List   Diagnosis Date Noted  . Biceps tendonitis on left 08/18/2018  . Rotator cuff tendonitis, left 06/08/2018  . Lumbar radiculopathy 04/26/2018  . Lumbar radicular pain 07/29/2016  . Myoclonus 09/19/2015  . Therapeutic opioid induced constipation 07/25/2015  . White matter changes 02/07/2015  . Essential hypertension 02/07/2015  . HLD (hyperlipidemia) 02/07/2015  . Low back pain 02/07/2015  .  BPPV (benign paroxysmal positional vertigo) 02/07/2015  . Abnormality of gait 02/07/2015  . Lumbar facet arthropathy 07/05/2014  . Acquired unequal leg length on left 08/08/2011  . Lumbar post-laminectomy syndrome 06/11/2011  . Sacroiliac joint dysfunction 06/11/2011    Oliviarose Punch L 01/07/2019, 3:34 PM  [facility] [address] [place], [place], [address] Phone: [phone]   Fax:  [phone]  Name: Alexandria Mclaughlin MRN: [id number] Date of Birth: [birth year]   Geoffry Paradise, PT,DPT 01/07/19 3:34 PM Phone: [phone] Fax: [phone]

## 2019-01-08 ENCOUNTER — Other Ambulatory Visit: Payer: Self-pay | Admitting: Physical Medicine & Rehabilitation

## 2019-01-11 ENCOUNTER — Other Ambulatory Visit: Payer: Self-pay | Admitting: Physical Medicine & Rehabilitation

## 2019-01-11 DIAGNOSIS — M961 Postlaminectomy syndrome, not elsewhere classified: Secondary | ICD-10-CM

## 2019-01-11 DIAGNOSIS — M5416 Radiculopathy, lumbar region: Secondary | ICD-10-CM

## 2019-01-19 ENCOUNTER — Encounter: Payer: Self-pay | Admitting: Physical Medicine & Rehabilitation

## 2019-01-19 ENCOUNTER — Encounter: Payer: Medicare HMO | Attending: Physical Medicine and Rehabilitation | Admitting: Physical Medicine & Rehabilitation

## 2019-01-19 ENCOUNTER — Other Ambulatory Visit: Payer: Self-pay

## 2019-01-19 VITALS — BP 119/81 | HR 76 | Temp 97.5°F | Ht 63.0 in | Wt 135.0 lb

## 2019-01-19 DIAGNOSIS — M2170 Unequal limb length (acquired), unspecified site: Secondary | ICD-10-CM | POA: Insufficient documentation

## 2019-01-19 DIAGNOSIS — M7522 Bicipital tendinitis, left shoulder: Secondary | ICD-10-CM | POA: Insufficient documentation

## 2019-01-19 DIAGNOSIS — M533 Sacrococcygeal disorders, not elsewhere classified: Secondary | ICD-10-CM | POA: Diagnosis not present

## 2019-01-19 DIAGNOSIS — M47816 Spondylosis without myelopathy or radiculopathy, lumbar region: Secondary | ICD-10-CM | POA: Insufficient documentation

## 2019-01-19 DIAGNOSIS — M5416 Radiculopathy, lumbar region: Secondary | ICD-10-CM | POA: Insufficient documentation

## 2019-01-19 DIAGNOSIS — H8113 Benign paroxysmal vertigo, bilateral: Secondary | ICD-10-CM | POA: Diagnosis not present

## 2019-01-19 DIAGNOSIS — M961 Postlaminectomy syndrome, not elsewhere classified: Secondary | ICD-10-CM | POA: Diagnosis not present

## 2019-01-19 MED ORDER — OXYCODONE-ACETAMINOPHEN 10-325 MG PO TABS: 1.0000 | ORAL_TABLET | Freq: Three times a day (TID) | ORAL | 0 refills | Status: DC | PRN

## 2019-01-19 MED ORDER — FENTANYL 25 MCG/HR TD PT72: 1.0000 | MEDICATED_PATCH | TRANSDERMAL | 0 refills | Status: DC

## 2019-01-19 NOTE — Patient Instructions (Signed)
PLEASE FEEL FREE TO CALL OUR OFFICE WITH ANY PROBLEMS OR QUESTIONS ([phone])      

## 2019-01-19 NOTE — Progress Notes (Signed)
Subjective:    Patient ID: Alexandria Mclaughlin, female    DOB: 1940-10-13, 78 y.o.   MRN: [id number]  HPI   Alexandria Mclaughlin is here in follow up of her chronic pain and gait disorder. She went for a therapy evaluation on 11/13 and bilateral bBPPV was confirmed. She doesn't have her next appt until 12/12. For now she's using her walker, furniture walking, etc. She loses her balance frequently, but fortunately hasn't fallen.   Her pain levels have been fairly steady fortunately.  Her shoulder have been a little more tender again however.   Pain Inventory Average Pain 3 Pain Right Now 4 My pain is constant, sharp and stabbing  In the last 24 hours, has pain interfered with the following? General activity 5 Relation with others 6 Enjoyment of life 7 What TIME of day is your pain at its worst? daytime Sleep (in general) Good  Pain is worse with: sitting and standing Pain improves with: rest, heat/ice and medication Relief from Meds: 5  Mobility walk without assistance use a walker ability to climb steps?  yes do you drive?  yes  Function Do you have any goals in this area?  yes  Neuro/Psych numbness trouble walking  Prior Studies Any changes since last visit?  no  Physicians involved in your care Any changes since last visit?  no   Family History  Problem Relation Age of Onset  . Diabetes Mother   . Heart disease Mother   . Stroke Mother   . Heart disease Father   . Heart disease Brother    Social History   Socioeconomic History  . Marital status: Widowed    Spouse name: Not on file  . Number of children: Not on file  . Years of education: Not on file  . Highest education level: Not on file  Occupational History  . Not on file  Social Needs  . Financial resource strain: Not on file  . Food insecurity    Worry: Not on file    Inability: Not on file  . Transportation needs    Medical: Not on file    Non-medical: Not on file  Tobacco Use  . Smoking status: Former  Smoker    Packs/day: 0.50    Years: 5.00    Pack years: 2.50    Types: Cigarettes    Quit date: 03/15/1968    Years since quitting: 50.8  . Smokeless tobacco: Never Used  Substance and Sexual Activity  . Alcohol use: Not Currently    Alcohol/week: 1.0 standard drinks    Types: 1 Glasses of wine per week    Comment: occasional glass of wine  . Drug use: No  . Sexual activity: Not on file  Lifestyle  . Physical activity    Days per week: Not on file    Minutes per session: Not on file  . Stress: Not on file  Relationships  . Social Herbalist on phone: Not on file    Gets together: Not on file    Attends religious service: Not on file    Active member of club or organization: Not on file    Attends meetings of clubs or organizations: Not on file    Relationship status: Not on file  Other Topics Concern  . Not on file  Social History Narrative  . Not on file   Past Surgical History:  Procedure Laterality Date  . ABDOMINAL HYSTERECTOMY    . BACK SURGERY  2011  . [place]  . CESAREAN SECTION     x2  . RETINAL DETACHMENT SURGERY  9/13  . TRANSFORAMINAL LUMBAR INTERBODY FUSION W/ MIS 1 LEVEL Right 04/26/2018   Procedure: Right Lumbar four-five Minimally invasive Transforaminal lumbar interbody fusion;  Surgeon: Judith Part, MD;  Location: [facility];  Service: Neurosurgery;  Laterality: Right;   Past Medical History:  Diagnosis Date  . Cervical facet syndrome   . Depression   . Disorders of sacrum   . Enthesopathy of hip region   . GERD (gastroesophageal reflux disease)   . Headache   . History of kidney stones   . Hypertension   . Sciatic neuritis   . Synovitis and tenosynovitis   . Vision abnormalities    There were no vitals taken for this visit.  Opioid Risk Score:   Fall Risk Score:  `1  Depression screen PHQ 2/9  Depression screen [facility] 2/9 01/25/2018 10/12/2017 06/03/2017 04/08/2017 10/22/2015 05/23/2015 03/20/2015  Decreased  Interest 0 0 0 0 0 0 0  Down, Depressed, Hopeless 0 0 0 0 0 0 0  PHQ - 2 Score 0 0 0 0 0 0 0  Altered sleeping - - 0 - - 0 -  Tired, decreased energy - - 0 - - 3 -  Change in appetite - - 0 - - 3 -  Feeling bad or failure about yourself  - - 0 - - 0 -  Trouble concentrating - - 0 - - 0 -  Moving slowly or fidgety/restless - - 0 - - 0 -  Suicidal thoughts - - 0 - - 0 -  PHQ-9 Score - - 0 - - 6 -  Some recent data might be hidden     Review of Systems  Constitutional: Positive for appetite change.  HENT: Negative.   Eyes: Negative.   Respiratory: Negative.   Cardiovascular: Negative.   Gastrointestinal: Positive for constipation.  Endocrine: Negative.   Genitourinary: Negative.   Musculoskeletal: Positive for arthralgias and gait problem.  Skin: Negative.   Allergic/Immunologic: Negative.   Neurological: Positive for numbness.  Hematological: Negative.   Psychiatric/Behavioral: Negative.   All other systems reviewed and are negative.      Objective:   Physical Exam  General: No acute distress HEENT: EOMI, oral membranes moist Cards: reg rate  Chest: normal effort Abdomen: Soft, NT, ND Skin: dry, intact Extremities: no edema Neurological:Patient is alert and oriented x3 with normal insight and awareness.  Speech is clear.  No language issues.  nystagmus with lateral gaze bilaterally. Loses balance posteriorly when going from sit to stand. Does better once equilibirum achieved. Wide based gait. Head forward. Strength 5/5.   Psych: pleasant      1.Chronic low back pain with lumbar postlaminectomy syndrome. Recent fall with new L1 compression fracture.: -had good results with her L4-5 fusion/decompression/scar tissue removal by NS -refilled fentanyl patch68mcgevery 72 hours, #10.   -refilledPercocet 10/325 #75.  -We will continue the controlled substance monitoring program, this consists of regular clinic visits, examinations, routine drug screening,  pill counts as well as use of [place] Controlled Substance Reporting System. [place]. -Medication was refilled and a second prescription was sent to the patient's pharmacy for next month.     2. Neuropathic Pain/right sided lumbar radicular pain, L4---improved after surgery -some residual numbness in therightleg which waxes and wanes --orthotics, appropriate shoe wear due to foot drop, EHL weakness have been discussed.  3. Gait  Disorder:Patient with worsening left-sided truncal ataxia today which is new. She does have a history of small vessel stroke. She is currently not on any anti-platelet or anticoagulant medications. -MRI without acute changes.         -can continue meclizine if she finds it helpful         -will see if we can get her in any sooner to neuro-rehab as she's a big fall risk 4. Myoclonus with sleep: Continue Klonopin 5.Lumbar facet arthropathy:had previous results with MBB's.Any further injections are on the back burner given her new findings. 6.Osteoarthritis of hands. Voltaren gel 3 times daily.  7. Right shoulderandleft shoulder pain, left biceps tendonitis -continue HEP    -may need further intervention in future.   Fifteen minutes of face to face patient care time were spent during this visit. All questions were encouraged and answered.  Follow up with me in 2 mos .

## 2019-01-21 ENCOUNTER — Other Ambulatory Visit: Payer: Self-pay | Admitting: Physical Medicine & Rehabilitation

## 2019-01-26 ENCOUNTER — Encounter: Payer: Self-pay | Admitting: Physical Therapy

## 2019-01-26 ENCOUNTER — Ambulatory Visit: Payer: Medicare HMO | Attending: Physical Medicine & Rehabilitation | Admitting: Physical Therapy

## 2019-01-26 ENCOUNTER — Other Ambulatory Visit: Payer: Self-pay

## 2019-01-26 DIAGNOSIS — R42 Dizziness and giddiness: Secondary | ICD-10-CM

## 2019-01-26 DIAGNOSIS — R2689 Other abnormalities of gait and mobility: Secondary | ICD-10-CM | POA: Diagnosis not present

## 2019-01-26 DIAGNOSIS — R2681 Unsteadiness on feet: Secondary | ICD-10-CM | POA: Diagnosis not present

## 2019-01-26 DIAGNOSIS — R209 Unspecified disturbances of skin sensation: Secondary | ICD-10-CM | POA: Insufficient documentation

## 2019-01-26 DIAGNOSIS — R208 Other disturbances of skin sensation: Secondary | ICD-10-CM

## 2019-01-26 DIAGNOSIS — H8113 Benign paroxysmal vertigo, bilateral: Secondary | ICD-10-CM | POA: Diagnosis not present

## 2019-01-26 NOTE — Therapy (Signed)
[facility] [address] [place], [place], [address] Phone: [phone]   Fax:  [phone]  Physical Therapy Treatment  Patient Details  Name: Alexandria Mclaughlin MRN: [id number] Date of Birth: 1940-08-27 Referring Provider (PT): Dr. Alger Simons   Encounter Date: 01/26/2019  PT End of Session - 01/26/19 1409    Visit Number  2    Number of Visits  17    Date for PT Re-Evaluation  04/07/19   to accommodate schedule wait time   Authorization Type  aetna Medicare (progress note every 10th visit)    PT Start Time  1406    PT Stop Time  1450    PT Time Calculation (min)  44 min    Equipment Utilized During Treatment  Other (comment)   min gaurd to minA   Activity Tolerance  Patient tolerated treatment well    Behavior During Therapy  [facility] for tasks assessed/performed       Past Medical History:  Diagnosis Date  . Cervical facet syndrome   . Depression   . Disorders of sacrum   . Enthesopathy of hip region   . GERD (gastroesophageal reflux disease)   . Headache   . History of kidney stones   . Hypertension   . Sciatic neuritis   . Synovitis and tenosynovitis   . Vision abnormalities     Past Surgical History:  Procedure Laterality Date  . ABDOMINAL HYSTERECTOMY    . BACK SURGERY     2011  . BLADDER SURGERY  1985  . CESAREAN SECTION     x2  . RETINAL DETACHMENT SURGERY  9/13  . TRANSFORAMINAL LUMBAR INTERBODY FUSION W/ MIS 1 LEVEL Right 04/26/2018   Procedure: Right Lumbar four-five Minimally invasive Transforaminal lumbar interbody fusion;  Surgeon: Judith Part, MD;  Location: [facility];  Service: Neurosurgery;  Laterality: Right;    There were no vitals filed for this visit.  Subjective Assessment - 01/26/19 1412    Subjective  Pt enters clinic with RW. Pt reports dizziness is worse in the morning when first getting up out of bed. She says sometimes when walking forwards it feels like something is  pulling her back making her feel like she's going ot fall backwards. Pt denies dec in UE strength but having dx pain in bilateral shoulders starting about a year ago.    Pertinent History  Osteoporosis, HTN, Hx of Lumbar radiculopathy and facet arthropathy-s/p Lumbar laminectomy 04/26/2018, hx of L1 compression fx 03/2018, L rotator cuff and bicep tendonitis, hx of diplopia-pt's ophthalmologist is aware, hx of R retinal detachment (had surgery for this 10/2011), hx of TIA per pt, hx of LLD, HLD, depression, HA, arthritis, Cervical facet syndrome    Patient Stated Goals  To be able to walk straight without using an AD (has been using RW, SPC since 3/20 surgery)    Currently in Pain?  No/denies    Pain Onset  More than a month ago         [facility] PT Assessment - 01/26/19 1425      Coordination   Gross Motor Movements are Fluid and Coordinated  --   rapid alternating movements WFL    Finger Nose Finger Test  WFL bilaterally, some overshooting with R    Heel Shin Test  [facility] bilaterally       ROM / Strength   AROM / PROM / Strength  Strength      AROM   Overall AROM  Comments  Gross UE strength 4-/5         Vestibular Assessment - 01/26/19 1414      Vestibular Assessment   General Observation  Reports no change in symptoms since evaluation.  Multiple near falls, even at physician's office.  Still using RW      Oculomotor Exam   Oculomotor Alignment  --   head tilted to R   Gaze-induced   Absent    Smooth Pursuits  Intact    Saccades  Slow    Comment  Test of skew: R exophoria      Oculomotor Exam-Fixation Suppressed    Left Head Impulse  difficult to assess due to neck guarding; refixation saccade vs. startle    Right Head Impulse  difficult to assess due to neck guarding      Vestibulo-Ocular Reflex   VOR to Slow Head Movement  Normal    VOR Cancellation  Normal      Other Tests   Comments  (+) deep head hang with downbeating nystagmus > 1 minute      Positional Testing    Dix-Hallpike  Dix-Hallpike Right;Dix-Hallpike Left      Dix-Hallpike Right   Dix-Hallpike Right Duration  10 sec with reports of dizziness,     Dix-Hallpike Right Symptoms  Upbeat, right rotatory nystagmus   changed to persistant downbeat      Dix-Hallpike Left   Dix-Hallpike Left Duration  >1 minute    Dix-Hallpike Left Symptoms  Downbeat Nystagmus   potentially minimal L rotatory, difficult to assess rotatory              [facility] Adult PT Treatment/Exercise - 01/26/19 0001      Ambulation/Gait   Ambulation/Gait Assistance  3: Mod assist;2: Max assist    Ambulation/Gait Assistance Details  Pt entered clinic with RW demonstrating significant instability requiring modA back to tx room. Pt transfers from sit to stand and requiring mod-maxA to transfer to mat as she LOB laterally to the R with LE instability and no stepping strategy to catch self.     Ambulation Distance (Feet)  100 Feet    Assistive device  Rolling walker    Gait Pattern  Step-to pattern;Decreased stride length;Decreased dorsiflexion - right;Shuffle    Ambulation Surface  Level;Indoor      Vestibular Treatment/Exercise - 01/26/19 1432      Vestibular Treatment/Exercise   Vestibular Treatment Provided  Canalith Repositioning    Canalith Repositioning  Epley Manuever Right       EPLEY MANUEVER RIGHT   Number of Reps   1    Overall Response  Improved Symptoms    Response Details   with dix hallpike re-check, no upbeating and rotary nystagmus noted   persistent downbeating nystagmus             PT Short Term Goals - 01/07/19 1528      PT SHORT TERM GOAL #1   Title  Pt will be IND with HEP to improve dizziness, strength, and balance. TARGET DATE FOR ALL STGS: 02/04/19    Time  4    Period  Weeks    Status  New      PT SHORT TERM GOAL #2   Title  Perform MMT, BERG/DGI and write goals as indicated.    Time  4    Period  Weeks    Status  New      PT SHORT TERM GOAL #3   Title  Pt will report  dizziness </=3/10 during all positional testing to improve safety during ADLs and QOL.    Time  4    Period  Weeks    Status  New      PT SHORT TERM GOAL #4   Title  Pt will improve gait speed to >/=1.41ft/sec with LRAD to decr. falls risk.    Time  4    Period  Weeks    Status  New      PT SHORT TERM GOAL #5   Title  Pt will amb. 200' over even terrain with LRAD and S to improve functional mobility.    Time  4    Period  Weeks    Status  New        PT Long Term Goals - 01/07/19 1530      PT LONG TERM GOAL #1   Title  Pt will report dizziness </=1/10 during all functional mobility and positional testing to improve safety during functional mobility. TARGET DATE FOR ALL LTGS: 03/03/18    Time  8    Period  Weeks    Status  New      PT LONG TERM GOAL #2   Title  Pt will amb. 400' over even/paved surfaces with LRAD at MOD I level to improve functional mobility.    Time  8    Period  Weeks    Status  New      PT LONG TERM GOAL #3   Title  Pt will improve gait speed to >/=1.56ft/sec with LRAD to decr. falls risk.    Time  8    Period  Weeks    Status  New            Plan - 01/26/19 1410    Clinical Impression Statement  Pt entered clinic with significant instability during gait with RW requiring modA to get back into a treatment room. She demonstrated ataxic gait and significant weakness upon standing and taking a few steps requiring  mod-maxA for a sit to stand transfer to treatment mat. Gross coordination and rapid alternating movements were negative. She demonstrated a (+) test of skew which could be d/t hx of diplopia but may indicate central involvement, (+) dix hallpike to the R and L. To the R, the pt had upbeating and rotatory nystagmus that changed to a persistent downbeating nystagmus. 1 rep of R Epley resolved upbeating nystagmus. To the L, the pt had downbeating nystagmus that may have had rotatory components but was difficult to assess. She was fearful and guarded  throughout session. Further testing is needed. Pt may benefit from skilled therapy services to address deficits.    Personal Factors and Comorbidities  Age;Comorbidity 3+;Behavior Pattern    Comorbidities  Osteoporosis, HTN, Hx of Lumbar radiculopathy and facet arthropathy-s/p Lumbar laminectomy 04/26/2018, hx of L1 compression fx 03/2018, L rotator cuff and bicep tendonitis, hx of diplopia-pt's ophthalmologist is aware, hx of R retinal detachment (had surgery for this 10/2011), hx of TIA per pt, hx of LLD, HLD, depression, HA, arthritis, Cervical facet syndrome    Examination-Activity Limitations  Bathing;Locomotion Level;Transfers;Bed Mobility;Carry;Squat;Stairs;Stand;Lift;Bend    Examination-Participation Restrictions  Driving;Yard Work;Community Activity    Stability/Clinical Decision Making  Evolving/Moderate complexity    Rehab Potential  Good    PT Frequency  2x / week    PT Duration  8 weeks    PT Treatment/Interventions  ADLs/Self Care Home Management;Biofeedback;Canalith Repostioning;DME Instruction;Balance training;Therapeutic exercise;Therapeutic activities;Patient/family education;Functional mobility training;Stair training;Gait training;Neuromuscular re-education;Vestibular  PT Next Visit Plan  Reassess for B pBPPV, aBPPV and treat as indicated. Perform MMT, BERG and DGI-write goals as indicated. Initiate HEP for LE strength and balance.    Consulted and Agree with Plan of Care  Patient       Patient will benefit from skilled therapeutic intervention in order to improve the following deficits and impairments:  Abnormal gait, Decreased range of motion, Dizziness, Decreased mobility, Impaired sensation, Postural dysfunction, Decreased balance, Decreased knowledge of use of DME, Impaired flexibility  Visit Diagnosis: BPPV (benign paroxysmal positional vertigo), bilateral  Dizziness and giddiness  Other abnormalities of gait and mobility  Other disturbances of skin  sensation  Unsteadiness on feet     Problem List Patient Active Problem List   Diagnosis Date Noted  . Biceps tendonitis on left 08/18/2018  . Rotator cuff tendonitis, left 06/08/2018  . Lumbar radiculopathy 04/26/2018  . Lumbar radicular pain 07/29/2016  . Myoclonus 09/19/2015  . Therapeutic opioid induced constipation 07/25/2015  . White matter changes 02/07/2015  . Essential hypertension 02/07/2015  . HLD (hyperlipidemia) 02/07/2015  . Low back pain 02/07/2015  . BPPV (benign paroxysmal positional vertigo) 02/07/2015  . Abnormality of gait 02/07/2015  . Lumbar facet arthropathy 07/05/2014  . Acquired unequal leg length on left 08/08/2011  . Lumbar post-laminectomy syndrome 06/11/2011  . Sacroiliac joint dysfunction 06/11/2011   Juliann Pulse SPT  01/26/2019, 5:15 PM  [facility] [address] [place], [place], [address] Phone: [phone]   Fax:  [phone]  Name: PREETHI LIGHTFOOT MRN: [id number] Date of Birth: September 09, 1940

## 2019-01-28 ENCOUNTER — Other Ambulatory Visit: Payer: Self-pay

## 2019-01-28 ENCOUNTER — Encounter: Payer: Self-pay | Admitting: Physical Therapy

## 2019-01-28 ENCOUNTER — Telehealth: Payer: Self-pay | Admitting: *Deleted

## 2019-01-28 ENCOUNTER — Ambulatory Visit: Payer: Medicare HMO | Admitting: Physical Therapy

## 2019-01-28 VITALS — BP 130/89 | HR 84

## 2019-01-28 DIAGNOSIS — H8113 Benign paroxysmal vertigo, bilateral: Secondary | ICD-10-CM

## 2019-01-28 DIAGNOSIS — R42 Dizziness and giddiness: Secondary | ICD-10-CM | POA: Diagnosis not present

## 2019-01-28 DIAGNOSIS — R209 Unspecified disturbances of skin sensation: Secondary | ICD-10-CM | POA: Diagnosis not present

## 2019-01-28 DIAGNOSIS — R2681 Unsteadiness on feet: Secondary | ICD-10-CM

## 2019-01-28 DIAGNOSIS — R2689 Other abnormalities of gait and mobility: Secondary | ICD-10-CM

## 2019-01-28 DIAGNOSIS — R208 Other disturbances of skin sensation: Secondary | ICD-10-CM

## 2019-01-28 NOTE — Telephone Encounter (Signed)
Letta Moynahan PT from outpt neuro rehab is requesting a call from [name] to discuss some concerns she has over symptoms she has observed in Ms Norcia not related to her BPPV. I have notified [name] to call her.

## 2019-01-28 NOTE — Therapy (Deleted)
[facility] [address] [place], [place], [address] Phone: [phone]   Fax:  [phone]  Physical Therapy Treatment  Patient Details  Name: Alexandria Mclaughlin MRN: [id number] Date of Birth: 1940-06-14 Referring Provider (PT): Dr. Alger Simons   Encounter Date: 01/28/2019  PT End of Session - 01/28/19 1353    Visit Number  3    Number of Visits  17    Date for PT Re-Evaluation  04/07/19   to accommodate schedule wait time   Authorization Type  aetna Medicare (progress note every 10th visit)    PT Start Time  1400    PT Stop Time  1440    PT Time Calculation (min)  40 min    Equipment Utilized During Treatment  Other (comment)   min gaurd to minA   Activity Tolerance  Patient tolerated treatment well    Behavior During Therapy  [facility] for tasks assessed/performed       Past Medical History:  Diagnosis Date  . Cervical facet syndrome   . Depression   . Disorders of sacrum   . Enthesopathy of hip region   . GERD (gastroesophageal reflux disease)   . Headache   . History of kidney stones   . Hypertension   . Sciatic neuritis   . Synovitis and tenosynovitis   . Vision abnormalities     Past Surgical History:  Procedure Laterality Date  . ABDOMINAL HYSTERECTOMY    . BACK SURGERY     2011  . BLADDER SURGERY  1985  . CESAREAN SECTION     x2  . RETINAL DETACHMENT SURGERY  9/13  . TRANSFORAMINAL LUMBAR INTERBODY FUSION W/ MIS 1 LEVEL Right 04/26/2018   Procedure: Right Lumbar four-five Minimally invasive Transforaminal lumbar interbody fusion;  Surgeon: Judith Part, MD;  Location: [facility];  Service: Neurosurgery;  Laterality: Right;    Vitals:   01/28/19 1418  BP: 130/89  Pulse: 84    Subjective Assessment - 01/28/19 1352    Subjective  Pt states feeling a little better today since last PT session. She says dizziness comes & goes.    Pertinent History  Osteoporosis, HTN, Hx of Lumbar radiculopathy and  facet arthropathy-s/p Lumbar laminectomy 04/26/2018, hx of L1 compression fx 03/2018, L rotator cuff and bicep tendonitis, hx of diplopia-pt's ophthalmologist is aware, hx of R retinal detachment (had surgery for this 10/2011), hx of TIA per pt, hx of LLD, HLD, depression, HA, arthritis, Cervical facet syndrome    Patient Stated Goals  To be able to walk straight without using an AD (has been using RW, SPC since 3/20 surgery)    Pain Onset  More than a month ago             Vestibular Assessment - 01/28/19 1404      Positional Testing   Dix-Hallpike  Dix-Hallpike Right      Dix-Hallpike Right   Dix-Hallpike Right Duration  ~5 sec latency; >1 minute    Dix-Hallpike Right Symptoms  Downbeat Nystagmus   persistant              [facility] Adult PT Treatment/Exercise - 01/28/19 1426      Ambulation/Gait   Ambulation/Gait Assistance  3: Mod assist;2: Max assist    Ambulation/Gait Assistance Details  Pt entered clinic with RW with less instability noted compared to previous session. She required minA from waiting area into tx room for cues for visual fixation to help with instability and  to prevent walker from getting out too far in front of pt. When transfering from mat <> chair during tx, pt required mod-maxA for stability as she LOB laterally to the L with no attempt at a stepping strategy.      Ambulation Distance (Feet)  50 Feet    Assistive device  Rolling walker    Gait Pattern  Step-to pattern;Decreased stride length;Decreased dorsiflexion - right;Shuffle;Ataxic   lateropulsion non-specific   Ambulation Surface  Level;Indoor    Gait Comments  When performing gait into and out of clinic and treatment area therapist provided cues to have pt utilize spotting on stable object for increased stability when ambulating.  Prior to turning therapist cued pt to stop, move eyes first to stable target and then turn body.  After session, PT felt it was necessary to guard pt out to friend's care  based on findings during treatment and demonstration of signficant ataxia and lateropulsion.       Posture/Postural Control   Posture Comments  Seated posture with head tilted to the R, pt states that has been like that for a while.       Vestibular Treatment/Exercise - 01/28/19 1428      Vestibular Treatment/Exercise   Vestibular Treatment Provided  Canalith Repositioning    Canalith Repositioning  Semont Procedure Left Anterior   modified for anterior canal     Semont Procedure Left Anterior   Number of Reps   1    Response Details   Reports of mild dizziness. PT noticed pt's R pupil remained constricted in dim lighting while L pupil responded appropriately. PT assessed pupillary reaction to light and R pupil remained constricted with not changes whereas L pupil contiued to respond appropriately.            PT Education - 01/28/19 1456    Education Details  discussed abnormal findings with pt and friend who accompanied patient today; PT to contact physician about symptoms and findings and discuss how to proceed with PT visits and if pt would benefit from a referral to neurology.    Person(s) Educated  Patient;Other (comment)   friend Stanton Kidney   Methods  Explanation    Comprehension  Verbalized understanding       PT Short Term Goals - 01/07/19 1528      PT SHORT TERM GOAL #1   Title  Pt will be IND with HEP to improve dizziness, strength, and balance. TARGET DATE FOR ALL STGS: 02/04/19    Time  4    Period  Weeks    Status  New      PT SHORT TERM GOAL #2   Title  Perform MMT, BERG/DGI and write goals as indicated.    Time  4    Period  Weeks    Status  New      PT SHORT TERM GOAL #3   Title  Pt will report dizziness </=3/10 during all positional testing to improve safety during ADLs and QOL.    Time  4    Period  Weeks    Status  New      PT SHORT TERM GOAL #4   Title  Pt will improve gait speed to >/=1.73ft/sec with LRAD to decr. falls risk.    Time  4    Period   Weeks    Status  New      PT SHORT TERM GOAL #5   Title  Pt will amb. 200' over even terrain with LRAD  and S to improve functional mobility.    Time  4    Period  Weeks    Status  New        PT Long Term Goals - 01/07/19 1530      PT LONG TERM GOAL #1   Title  Pt will report dizziness </=1/10 during all functional mobility and positional testing to improve safety during functional mobility. TARGET DATE FOR ALL LTGS: 03/03/18    Time  8    Period  Weeks    Status  New      PT LONG TERM GOAL #2   Title  Pt will amb. 400' over even/paved surfaces with LRAD at MOD I level to improve functional mobility.    Time  8    Period  Weeks    Status  New      PT LONG TERM GOAL #3   Title  Pt will improve gait speed to >/=1.29ft/sec with LRAD to decr. falls risk.    Time  8    Period  Weeks    Status  New            Plan - 01/28/19 1353    Clinical Impression Statement  Initially patient appeared more stable with ambulation with therapist providing min A and cues to utilize spotting on stable object when ambulating and turning.  Performed reassessment of peripheral canals with R posterior canal BPPV resolved; pt continued to present with persistent downbeating nystagmus.  Performed semont manuever modified for anterior canal but dimmed lights due to patient looking up at the ceiling.  As PT examined pt's eyes, it was noted that her L pupil had dilated as appropriate in low light but R pupil remained constricted.  Using flashlight, assessed pupil reactivity.  R pupil not reactive to light.  Returned pt to upright and discussed findings with pt and friend.  Did not perform any further manuevers today - due to pt presenting with ongoing ataxia, intermittent lateropulsion, non-reactive R pupil and persistent downbeating nystagmus PT will discuss findings with physician and then determine if to proceed with therapy or place pt on hold.  Pt agreeable to plan.  Therapist escorted pt to car to ensure  safety.   Simultaneous filing. User may not have seen previous data.   Personal Factors and Comorbidities  Age;Comorbidity 3+;Behavior Pattern    Comorbidities  Osteoporosis, HTN, Hx of Lumbar radiculopathy and facet arthropathy-s/p Lumbar laminectomy 04/26/2018, hx of L1 compression fx 03/2018, L rotator cuff and bicep tendonitis, hx of diplopia-pt's ophthalmologist is aware, hx of R retinal detachment (had surgery for this 10/2011), hx of TIA per pt, hx of LLD, HLD, depression, HA, arthritis, Cervical facet syndrome    Examination-Activity Limitations  Bathing;Locomotion Level;Transfers;Bed Mobility;Carry;Squat;Stairs;Stand;Lift;Bend    Examination-Participation Restrictions  Driving;Yard Work;Community Activity    Stability/Clinical Decision Making  Evolving/Moderate complexity    Rehab Potential  Good    PT Frequency  2x / week    PT Duration  8 weeks    PT Treatment/Interventions  ADLs/Self Care Home Management;Biofeedback;Canalith Repostioning;DME Instruction;Balance training;Therapeutic exercise;Therapeutic activities;Patient/family education;Functional mobility training;Stair training;Gait training;Neuromuscular re-education;Vestibular    PT Next Visit Plan  Reassess for B pBPPV, aBPPV and treat as indicated. Perform MMT, BERG and DGI-write goals as indicated. Initiate HEP for LE strength and balance.    Consulted and Agree with Plan of Care  Patient       Patient will benefit from skilled therapeutic intervention in order to improve the following deficits and  impairments:  Abnormal gait, Decreased range of motion, Dizziness, Decreased mobility, Impaired sensation, Postural dysfunction, Decreased balance, Decreased knowledge of use of DME, Impaired flexibility  Visit Diagnosis: BPPV (benign paroxysmal positional vertigo), bilateral  Dizziness and giddiness  Other abnormalities of gait and mobility  Other disturbances of skin sensation  Unsteadiness on feet     Problem  List Patient Active Problem List   Diagnosis Date Noted  . Biceps tendonitis on left 08/18/2018  . Rotator cuff tendonitis, left 06/08/2018  . Lumbar radiculopathy 04/26/2018  . Lumbar radicular pain 07/29/2016  . Myoclonus 09/19/2015  . Therapeutic opioid induced constipation 07/25/2015  . White matter changes 02/07/2015  . Essential hypertension 02/07/2015  . HLD (hyperlipidemia) 02/07/2015  . Low back pain 02/07/2015  . BPPV (benign paroxysmal positional vertigo) 02/07/2015  . Abnormality of gait 02/07/2015  . Lumbar facet arthropathy 07/05/2014  . Acquired unequal leg length on left 08/08/2011  . Lumbar post-laminectomy syndrome 06/11/2011  . Sacroiliac joint dysfunction 06/11/2011   Rico Junker, PT, DPT 01/28/19    3:13 PM    [facility] [address] [place], [place], [address] Phone: [phone]   Fax:  [phone]  Name: NEELA BERKLAND MRN: [id number] Date of Birth: October 29, 1940

## 2019-01-28 NOTE — Therapy (Signed)
[facility] [address] [place], [place], [address] Phone: [phone]   Fax:  [phone]  Physical Therapy Treatment  Patient Details  Name: Alexandria Mclaughlin MRN: [id number] Date of Birth: 1940-09-26 Referring Provider (PT): Dr. Alger Simons   Encounter Date: 01/28/2019  PT End of Session - 01/28/19 1353    Visit Number  3    Number of Visits  17    Date for PT Re-Evaluation  04/07/19   to accommodate schedule wait time   Authorization Type  aetna Medicare (progress note every 10th visit)    PT Start Time  1400    PT Stop Time  1440    PT Time Calculation (min)  40 min    Equipment Utilized During Treatment  Other (comment)   min gaurd to minA   Activity Tolerance  Patient tolerated treatment well    Behavior During Therapy  [facility] for tasks assessed/performed       Past Medical History:  Diagnosis Date  . Cervical facet syndrome   . Depression   . Disorders of sacrum   . Enthesopathy of hip region   . GERD (gastroesophageal reflux disease)   . Headache   . History of kidney stones   . Hypertension   . Sciatic neuritis   . Synovitis and tenosynovitis   . Vision abnormalities     Past Surgical History:  Procedure Laterality Date  . ABDOMINAL HYSTERECTOMY    . BACK SURGERY     2011  . BLADDER SURGERY  1985  . CESAREAN SECTION     x2  . RETINAL DETACHMENT SURGERY  9/13  . TRANSFORAMINAL LUMBAR INTERBODY FUSION W/ MIS 1 LEVEL Right 04/26/2018   Procedure: Right Lumbar four-five Minimally invasive Transforaminal lumbar interbody fusion;  Surgeon: Judith Part, MD;  Location: [facility];  Service: Neurosurgery;  Laterality: Right;    Vitals:   01/28/19 1418  BP: 130/89  Pulse: 84    Subjective Assessment - 01/28/19 1352    Subjective  Pt states feeling a little better today since last PT session. She says dizziness comes & goes.    Pertinent History  Osteoporosis, HTN, Hx of Lumbar radiculopathy and  facet arthropathy-s/p Lumbar laminectomy 04/26/2018, hx of L1 compression fx 03/2018, L rotator cuff and bicep tendonitis, hx of diplopia-pt's ophthalmologist is aware, hx of R retinal detachment (had surgery for this 10/2011), hx of TIA per pt, hx of LLD, HLD, depression, HA, arthritis, Cervical facet syndrome    Patient Stated Goals  To be able to walk straight without using an AD (has been using RW, SPC since 3/20 surgery)    Pain Onset  More than a month ago             Vestibular Assessment - 01/28/19 1404      Positional Testing   Dix-Hallpike  Dix-Hallpike Right      Dix-Hallpike Right   Dix-Hallpike Right Duration  ~5 sec latency; >1 minute    Dix-Hallpike Right Symptoms  Downbeat Nystagmus   persistant              [facility] Adult PT Treatment/Exercise - 01/28/19 1426      Ambulation/Gait   Ambulation/Gait Assistance  3: Mod assist;2: Max assist    Ambulation/Gait Assistance Details  Pt entered clinic with RW with less instability noted compared to previous session. She required minA from waiting area into tx room for cues for visual fixation to help with instability and  to prevent walker from getting out too far in front of pt. When transfering from mat <> chair during tx, pt required mod-maxA for stability as she LOB laterally to the L with no attempt at a stepping strategy.      Ambulation Distance (Feet)  50 Feet    Assistive device  Rolling walker    Gait Pattern  Step-to pattern;Decreased stride length;Decreased dorsiflexion - right;Shuffle;Ataxic   lateropulsion non-specific   Ambulation Surface  Level;Indoor    Gait Comments  When performing gait into and out of clinic and treatment area therapist provided cues to have pt utilize spotting on stable object for increased stability when ambulating.  Prior to turning therapist cued pt to stop, move eyes first to stable target and then turn body.  After session, PT felt it was necessary to guard pt out to friend's care  based on findings during treatment and demonstration of signficant ataxia and lateropulsion.       Posture/Postural Control   Posture Comments  Seated posture with head tilted to the R, pt states that has been like that for a while.       Vestibular Treatment/Exercise - 01/28/19 1428      Vestibular Treatment/Exercise   Vestibular Treatment Provided  Canalith Repositioning    Canalith Repositioning  Semont Procedure Left Anterior   modified for anterior canal     Semont Procedure Left Anterior   Number of Reps   1    Response Details   Reports of mild dizziness. PT noticed pt's R pupil remained constricted in dim lighting while L pupil responded appropriately. PT assessed pupillary reaction to light and R pupil remained constricted with not changes whereas L pupil contiued to respond appropriately.            PT Education - 01/28/19 1456    Education Details  discussed abnormal findings with pt and friend who accompanied patient today; PT to contact physician about symptoms and findings and discuss how to proceed with PT visits and if pt would benefit from a referral to neurology.    Person(s) Educated  Patient;Other (comment)   friend Stanton Kidney   Methods  Explanation    Comprehension  Verbalized understanding       PT Short Term Goals - 01/07/19 1528      PT SHORT TERM GOAL #1   Title  Pt will be IND with HEP to improve dizziness, strength, and balance. TARGET DATE FOR ALL STGS: 02/04/19    Time  4    Period  Weeks    Status  New      PT SHORT TERM GOAL #2   Title  Perform MMT, BERG/DGI and write goals as indicated.    Time  4    Period  Weeks    Status  New      PT SHORT TERM GOAL #3   Title  Pt will report dizziness </=3/10 during all positional testing to improve safety during ADLs and QOL.    Time  4    Period  Weeks    Status  New      PT SHORT TERM GOAL #4   Title  Pt will improve gait speed to >/=1.2ft/sec with LRAD to decr. falls risk.    Time  4    Period   Weeks    Status  New      PT SHORT TERM GOAL #5   Title  Pt will amb. 200' over even terrain with LRAD  and S to improve functional mobility.    Time  4    Period  Weeks    Status  New        PT Long Term Goals - 01/07/19 1530      PT LONG TERM GOAL #1   Title  Pt will report dizziness </=1/10 during all functional mobility and positional testing to improve safety during functional mobility. TARGET DATE FOR ALL LTGS: 03/03/18    Time  8    Period  Weeks    Status  New      PT LONG TERM GOAL #2   Title  Pt will amb. 400' over even/paved surfaces with LRAD at MOD I level to improve functional mobility.    Time  8    Period  Weeks    Status  New      PT LONG TERM GOAL #3   Title  Pt will improve gait speed to >/=1.64ft/sec with LRAD to decr. falls risk.    Time  8    Period  Weeks    Status  New            Plan - 01/28/19 1353    Clinical Impression Statement  Initially patient appeared more stable with ambulation with therapist providing min A and cues to utilize spotting on stable object when ambulating and turning.  Performed reassessment of peripheral canals with R posterior canal BPPV resolved; pt continued to present with persistent downbeating nystagmus.  Performed semont manuever modified for anterior canal but dimmed lights due to patient looking up at the ceiling.  As PT examined pt's eyes, it was noted that her L pupil had dilated as appropriate in low light but R pupil remained constricted.  Using flashlight, assessed pupil reactivity.  R pupil not reactive to light.  Returned pt to upright and discussed findings with pt and friend.  Did not perform any further manuevers today - due to pt presenting with ongoing ataxia, intermittent lateropulsion, non-reactive R pupil and persistent downbeating nystagmus PT will discuss findings with physician and then determine if to proceed with therapy or place pt on hold.  Pt agreeable to plan.  Therapist escorted pt to car to ensure  safety.   Simultaneous filing. User may not have seen previous data.   Personal Factors and Comorbidities  Age;Comorbidity 3+;Behavior Pattern    Comorbidities  Osteoporosis, HTN, Hx of Lumbar radiculopathy and facet arthropathy-s/p Lumbar laminectomy 04/26/2018, hx of L1 compression fx 03/2018, L rotator cuff and bicep tendonitis, hx of diplopia-pt's ophthalmologist is aware, hx of R retinal detachment (had surgery for this 10/2011), hx of TIA per pt, hx of LLD, HLD, depression, HA, arthritis, Cervical facet syndrome    Examination-Activity Limitations  Bathing;Locomotion Level;Transfers;Bed Mobility;Carry;Squat;Stairs;Stand;Lift;Bend    Examination-Participation Restrictions  Driving;Yard Work;Community Activity    Stability/Clinical Decision Making  Evolving/Moderate complexity    Rehab Potential  Good    PT Frequency  2x / week    PT Duration  8 weeks    PT Treatment/Interventions  ADLs/Self Care Home Management;Biofeedback;Canalith Repostioning;DME Instruction;Balance training;Therapeutic exercise;Therapeutic activities;Patient/family education;Functional mobility training;Stair training;Gait training;Neuromuscular re-education;Vestibular    PT Next Visit Plan  Reassess for B pBPPV, aBPPV and treat as indicated. Perform MMT, BERG and DGI-write goals as indicated. Initiate HEP for LE strength and balance.    Consulted and Agree with Plan of Care  Patient       Patient will benefit from skilled therapeutic intervention in order to improve the following deficits and  impairments:  Abnormal gait, Decreased range of motion, Dizziness, Decreased mobility, Impaired sensation, Postural dysfunction, Decreased balance, Decreased knowledge of use of DME, Impaired flexibility  Visit Diagnosis: BPPV (benign paroxysmal positional vertigo), bilateral  Dizziness and giddiness  Other abnormalities of gait and mobility  Other disturbances of skin sensation  Unsteadiness on feet     Problem  List Patient Active Problem List   Diagnosis Date Noted  . Biceps tendonitis on left 08/18/2018  . Rotator cuff tendonitis, left 06/08/2018  . Lumbar radiculopathy 04/26/2018  . Lumbar radicular pain 07/29/2016  . Myoclonus 09/19/2015  . Therapeutic opioid induced constipation 07/25/2015  . White matter changes 02/07/2015  . Essential hypertension 02/07/2015  . HLD (hyperlipidemia) 02/07/2015  . Low back pain 02/07/2015  . BPPV (benign paroxysmal positional vertigo) 02/07/2015  . Abnormality of gait 02/07/2015  . Lumbar facet arthropathy 07/05/2014  . Acquired unequal leg length on left 08/08/2011  . Lumbar post-laminectomy syndrome 06/11/2011  . Sacroiliac joint dysfunction 06/11/2011   Juliann Pulse SPT   Juliann Pulse 01/28/2019, 3:12 PM  [facility] [address] [place], [place], [address] Phone: [phone]   Fax:  [phone]  Name: SAKAE EULL MRN: [id number] Date of Birth: 1940-09-15

## 2019-01-31 ENCOUNTER — Telehealth: Payer: Self-pay

## 2019-01-31 DIAGNOSIS — R278 Other lack of coordination: Secondary | ICD-10-CM

## 2019-01-31 DIAGNOSIS — R42 Dizziness and giddiness: Secondary | ICD-10-CM

## 2019-01-31 NOTE — Telephone Encounter (Signed)
Message from patient

## 2019-01-31 NOTE — Telephone Encounter (Signed)
I spoke to Dr. Leta Baptist today and made a referral to [facility] for further assessment of her ataxia. I also called and spoke with the patient.

## 2019-01-31 NOTE — Telephone Encounter (Signed)
Patient left voicemail at 10:54 that she is dizzy and thinks PT called you I- SHE would like for you to call her back and help [phone]

## 2019-01-31 NOTE — Telephone Encounter (Signed)
Already addressed in another block

## 2019-02-02 ENCOUNTER — Telehealth: Payer: Self-pay

## 2019-02-02 NOTE — Telephone Encounter (Signed)
Continue with PT as advised and keep me updated with any changes in status. i'll check with neuro to see if they can get her in sooner.

## 2019-02-02 NOTE — Telephone Encounter (Signed)
Patient called stating finally heard back from neurologist and now has appointment with them but it wont be until 03-22-2019.  Wants to know if there is anything else that needs or could be done.

## 2019-02-03 ENCOUNTER — Encounter: Payer: Self-pay | Admitting: Physical Therapy

## 2019-02-03 ENCOUNTER — Other Ambulatory Visit: Payer: Self-pay

## 2019-02-03 ENCOUNTER — Ambulatory Visit: Payer: Medicare HMO | Admitting: Physical Therapy

## 2019-02-03 ENCOUNTER — Other Ambulatory Visit: Payer: Self-pay | Admitting: Physical Medicine & Rehabilitation

## 2019-02-03 DIAGNOSIS — R2689 Other abnormalities of gait and mobility: Secondary | ICD-10-CM | POA: Diagnosis not present

## 2019-02-03 DIAGNOSIS — R42 Dizziness and giddiness: Secondary | ICD-10-CM

## 2019-02-03 DIAGNOSIS — G253 Myoclonus: Secondary | ICD-10-CM

## 2019-02-03 DIAGNOSIS — R2681 Unsteadiness on feet: Secondary | ICD-10-CM | POA: Diagnosis not present

## 2019-02-03 DIAGNOSIS — R208 Other disturbances of skin sensation: Secondary | ICD-10-CM

## 2019-02-03 DIAGNOSIS — R209 Unspecified disturbances of skin sensation: Secondary | ICD-10-CM | POA: Diagnosis not present

## 2019-02-03 DIAGNOSIS — H8113 Benign paroxysmal vertigo, bilateral: Secondary | ICD-10-CM | POA: Diagnosis not present

## 2019-02-03 NOTE — Patient Instructions (Signed)
Gaze Stabilization: Sitting    Keeping eyes on target on wall 3-4 feet away, and move head side to side for _30___ seconds with one eye covered.  Switch eyes and repeat side to side for 30 seconds. Uncover both eyes and Repeat while moving head up and down for __30__ seconds. Do __2-3__ sessions per day.  Copyright  VHI. All rights reserved.   Gaze Stabilization: Tip Card  1.Target must remain in focus, not blurry, and appear stationary while head is in motion. 2.Perform exercises with small head movements (45 to either side of midline). 3.Increase speed of head motion so long as target is in focus. 4.If you wear eyeglasses, be sure you can see target through lens (therapist will give specific instructions for bifocal / progressive lenses). 5.These exercises may provoke dizziness or nausea. Work through these symptoms. If too dizzy, slow head movement slightly. Rest between each exercise. 6.Exercises demand concentration; avoid distractions.   IF WALKING DOWN HALLWAY:  Walk with walker, walk slow and keep your left foot slightly out to the side.   Keep your eyes focused on a stable target while walking forwards  Stop - When you turn, turn your head first to look at a stable target and then turn your body

## 2019-02-03 NOTE — Therapy (Signed)
[facility] [address] [place], [place], [address] Phone: [phone]   Fax:  [phone]  Physical Therapy Treatment  Patient Details  Name: Alexandria Mcl[address] MRN: [id number] Date of Birth: [birth year] Referring Provider (PT): Dr. Alger Simons   Encounter Date: 02/03/2019  PT End of Session - 02/03/19 2157    Visit Number  4    Number of Visits  17    Date for PT Re-Evaluation  04/07/19   to accommodate schedule wait time   Authorization Type  aetna Medicare (progress note every 10th visit)    PT Start Time  1535    PT Stop Time  1620    PT Time Calculation (min)  45 min    Equipment Utilized During Treatment  --    Activity Tolerance  Patient tolerated treatment well    Behavior During Therapy  [facility] for tasks assessed/performed       Past Medical History:  Diagnosis Date  . Cervical facet syndrome   . Depression   . Disorders of sacrum   . Enthesopathy of hip region   . GERD (gastroesophageal reflux disease)   . Headache   . History of kidney stones   . Hypertension   . Sciatic neuritis   . Synovitis and tenosynovitis   . Vision abnormalities     Past Surgical History:  Procedure Laterality Date  . ABDOMINAL HYSTERECTOMY    . BACK SURGERY     2011  . BLADDER SURGERY  1985  . CESAREAN SECTION     x2  . RETINAL DETACHMENT SURGERY  9/13  . TRANSFORAMINAL LUMBAR INTERBODY FUSION W/ MIS 1 LEVEL Right 04/26/2018   Procedure: Right Lumbar four-five Minimally invasive Transforaminal lumbar interbody fusion;  Surgeon: Judith Part, MD;  Location: [facility];  Service: Neurosurgery;  Laterality: Right;    There were no vitals filed for this visit.  Subjective Assessment - 02/03/19 1538    Subjective  Pt has been walking more with RW; if she walks with cane she gets very off balance, feels like she is getting "pulled" and can't stop it.  Doesn't experience any dizziness.    Pertinent History  Osteoporosis,  HTN, Hx of Lumbar radiculopathy and facet arthropathy-s/p Lumbar laminectomy 04/26/2018, hx of L1 compression fx 03/2018, L rotator cuff and bicep tendonitis, hx of diplopia-pt's ophthalmologist is aware, hx of R retinal detachment (had surgery for this 10/2011), hx of TIA per pt, hx of LLD, HLD, depression, HA, arthritis, Cervical facet syndrome    Patient Stated Goals  To be able to walk straight without using an AD (has been using RW, SPC since 3/20 surgery)    Currently in Pain?  No/denies    Pain Onset  More than a month ago                       [facility] Adult PT Treatment/Exercise - 02/03/19 2113      Transfers   Transfers  Sit to Stand;Stand to Sit    Sit to Stand  5: Supervision    Sit to Stand Details (indicate cue type and reason)  with eyes focused on stable target to improve stability when transitioning from sit > stand    Stand to Sit  5: Supervision    Stand to Sit Details  with eyes fixed on stable target for increased stability; cues to flex at hips and reach for seat with hips to counterbalance COG moving posterior  due to pt sitting with uncontrolled descent    Number of Reps  10 reps      Ambulation/Gait   Ambulation/Gait  Yes    Ambulation/Gait Assistance  5: Supervision    Ambulation/Gait Assistance Details  training for to improve safety of home ambulation with use of slow walking and eyes fixed on stable target in front of her for increased stability and to orient to vertical.  When needing to change directions pt cued to stop, turn eyes and head to focus on object first and then slowly turn body.  With this method pt able to ambulate down the hall x 2 reps with RW and therapist only providing supervision and intermittent cues to widen BOS; one small episode of lateropulsion but pt able to correct    Ambulation Distance (Feet)  80 Feet    Assistive device  Rolling walker    Gait Pattern  Step-through pattern;Decreased step length - right;Decreased step length -  left;Decreased stride length;Decreased dorsiflexion - right;Decreased dorsiflexion - left;Shuffle;Scissoring;Narrow base of support    Ambulation Surface  Level;Indoor      Vestibular Treatment/Exercise - 02/03/19 1543      Vestibular Treatment/Exercise   Vestibular Treatment Provided  Gaze    Gaze Exercises  X1 Viewing Horizontal;X1 Viewing Vertical      X1 Viewing Horizontal   Foot Position  seated with back supported    Reps  3    Comments  30 seconds each; diplopia when performing binocular, change to monocular for horizontal      X1 Viewing Vertical   Foot Position  seated with back support    Reps  2    Comments  20 seconds, binocular            PT Education - 02/03/19 2123    Education Details  initiated vestibular training with monocular x1 viewing horizontal, binocular vertical.  use of spotting for stability during sit <> stand and ambulating    Person(s) Educated  Patient    Methods  Explanation    Comprehension  Verbalized understanding       Gaze Stabilization: Sitting    Keeping eyes on target on wall 3-4 feet away, and move head side to side for _30___ seconds with one eye covered.  Switch eyes and repeat side to side for 30 seconds. Uncover both eyes and Repeat while moving head up and down for __30__ seconds. Do __2-3__ sessions per day.  Copyright  VHI. All rights reserved.   Gaze Stabilization: Tip Card  1.Target must remain in focus, not blurry, and appear stationary while head is in motion. 2.Perform exercises with small head movements (45 to either side of midline). 3.Increase speed of head motion so long as target is in focus. 4.If you wear eyeglasses, be sure you can see target through lens (therapist will give specific instructions for bifocal / progressive lenses). 5.These exercises may provoke dizziness or nausea. Work through these symptoms. If too dizzy, slow head movement slightly. Rest between each exercise. 6.Exercises demand  concentration; avoid distractions.   IF WALKING DOWN HALLWAY:  Walk with walker, walk slow and keep your left foot slightly out to the side.   Keep your eyes focused on a stable target while walking forwards  Stop - When you turn, turn your head first to look at a stable target and then turn your body      PT Short Term Goals - 01/07/19 1528      PT SHORT TERM GOAL #  1   Title  Pt will be IND with HEP to improve dizziness, strength, and balance. TARGET DATE FOR ALL STGS: 02/04/19    Time  4    Period  Weeks    Status  New      PT SHORT TERM GOAL #2   Title  Perform MMT, BERG/DGI and write goals as indicated.    Time  4    Period  Weeks    Status  New      PT SHORT TERM GOAL #3   Title  Pt will report dizziness </=3/10 during all positional testing to improve safety during ADLs and QOL.    Time  4    Period  Weeks    Status  New      PT SHORT TERM GOAL #4   Title  Pt will improve gait speed to >/=1.62ft/sec with LRAD to decr. falls risk.    Time  4    Period  Weeks    Status  New      PT SHORT TERM GOAL #5   Title  Pt will amb. 200' over even terrain with LRAD and S to improve functional mobility.    Time  4    Period  Weeks    Status  New        PT Long Term Goals - 01/07/19 1530      PT LONG TERM GOAL #1   Title  Pt will report dizziness </=1/10 during all functional mobility and positional testing to improve safety during functional mobility. TARGET DATE FOR ALL LTGS: 03/03/18    Time  8    Period  Weeks    Status  New      PT LONG TERM GOAL #2   Title  Pt will amb. 400' over even/paved surfaces with LRAD at MOD I level to improve functional mobility.    Time  8    Period  Weeks    Status  New      PT LONG TERM GOAL #3   Title  Pt will improve gait speed to >/=1.35ft/sec with LRAD to decr. falls risk.    Time  8    Period  Weeks    Status  New            Plan - 02/03/19 2158    Clinical Impression Statement  Pt reporting some decrease in  dizziness but ongoing balance impairments - initiated gaze adaptation training but pt reporting diplopia with binocular horizontal head movements.  Peformed horizontal x1 viewing monocular and vertical binocular.  Also utilized spotting during sit <> stand and ambulation in hallway and advised pt to utilize at home.  Only one episode of lateropulsion with pt able to self correct.  Will continue to address and progress towards LTG.    Personal Factors and Comorbidities  Age;Comorbidity 3+;Behavior Pattern    Comorbidities  Osteoporosis, HTN, Hx of Lumbar radiculopathy and facet arthropathy-s/p Lumbar laminectomy 04/26/2018, hx of L1 compression fx 03/2018, L rotator cuff and bicep tendonitis, hx of diplopia-pt's neurologist and ophthalmologist is aware, hx of R retinal detachment (had surgery for this 10/2011), hx of suspected TIA per pt, hx of LLD, HLD, depression, HA, arthritis, Cervical facet syndrome    Examination-Activity Limitations  Bathing;Locomotion Level;Transfers;Bed Mobility;Carry;Squat;Stairs;Stand;Lift;Bend    Examination-Participation Restrictions  Driving;Yard Work;Community Activity    Stability/Clinical Decision Making  Evolving/Moderate complexity    Rehab Potential  Good    PT Frequency  2x / week  PT Duration  8 weeks    PT Treatment/Interventions  ADLs/Self Care Home Management;Biofeedback;Canalith Repostioning;DME Instruction;Balance training;Therapeutic exercise;Therapeutic activities;Patient/family education;Functional mobility training;Stair training;Gait training;Neuromuscular re-education;Vestibular    PT Next Visit Plan  Check STG.  Reassess for B pBPPV, aBPPV and treat as indicated.  How is she doing with walking at home?  Progress x1 viewing?    Consulted and Agree with Plan of Care  Patient       Patient will benefit from skilled therapeutic intervention in order to improve the following deficits and impairments:  Abnormal gait, Decreased range of motion, Dizziness,  Decreased mobility, Impaired sensation, Postural dysfunction, Decreased balance, Decreased knowledge of use of DME, Impaired flexibility  Visit Diagnosis: Dizziness and giddiness  Other abnormalities of gait and mobility  Other disturbances of skin sensation  Unsteadiness on feet     Problem List Patient Active Problem List   Diagnosis Date Noted  . Biceps tendonitis on left 08/18/2018  . Rotator cuff tendonitis, left 06/08/2018  . Lumbar radiculopathy 04/26/2018  . Lumbar radicular pain 07/29/2016  . Myoclonus 09/19/2015  . Therapeutic opioid induced constipation 07/25/2015  . White matter changes 02/07/2015  . Essential hypertension 02/07/2015  . HLD (hyperlipidemia) 02/07/2015  . Low back pain 02/07/2015  . BPPV (benign paroxysmal positional vertigo) 02/07/2015  . Abnormality of gait 02/07/2015  . Lumbar facet arthropathy 07/05/2014  . Acquired unequal leg length on left 08/08/2011  . Lumbar post-laminectomy syndrome 06/11/2011  . Sacroiliac joint dysfunction 06/11/2011    Rico Junker, PT, DPT 02/03/19    10:05 PM    [facility] [address] [place], [place], [address] Phone: [phone]   Fax:  [phone]  Name: Alexandria [address] MRN: [id number] Date of Birth: 03/01/1940

## 2019-02-04 NOTE — Telephone Encounter (Signed)
Relayed information to patient

## 2019-02-07 ENCOUNTER — Ambulatory Visit: Payer: Medicare HMO | Admitting: Physical Therapy

## 2019-02-07 ENCOUNTER — Other Ambulatory Visit: Payer: Self-pay

## 2019-02-07 ENCOUNTER — Encounter: Payer: Self-pay | Admitting: Physical Therapy

## 2019-02-07 DIAGNOSIS — R2689 Other abnormalities of gait and mobility: Secondary | ICD-10-CM | POA: Diagnosis not present

## 2019-02-07 DIAGNOSIS — R2681 Unsteadiness on feet: Secondary | ICD-10-CM

## 2019-02-07 DIAGNOSIS — R42 Dizziness and giddiness: Secondary | ICD-10-CM | POA: Diagnosis not present

## 2019-02-07 DIAGNOSIS — H8113 Benign paroxysmal vertigo, bilateral: Secondary | ICD-10-CM

## 2019-02-07 DIAGNOSIS — R209 Unspecified disturbances of skin sensation: Secondary | ICD-10-CM | POA: Diagnosis not present

## 2019-02-07 DIAGNOSIS — R208 Other disturbances of skin sensation: Secondary | ICD-10-CM

## 2019-02-07 NOTE — Therapy (Signed)
[facility] [address] [place], [place], [address] Phone: [phone]   Fax:  [phone]  Physical Therapy Treatment & Short-Term Goal Update  Patient Details  Name: Alexandria Mclaughlin MRN: [id number] Date of Birth: [birth year] Referring Provider (PT): Dr. Alger Simons   Encounter Date: 02/07/2019  PT End of Session - 02/07/19 1455    Visit Number  5    Number of Visits  17    Date for PT Re-Evaluation  04/07/19   to accommodate schedule wait time   Authorization Type  aetna Medicare (progress note every 10th visit)    PT Start Time  1150    PT Stop Time  1235    PT Time Calculation (min)  45 min    Equipment Utilized During Treatment  Gait belt    Activity Tolerance  Patient tolerated treatment well    Behavior During Therapy  [facility] for tasks assessed/performed       Past Medical History:  Diagnosis Date  . Cervical facet syndrome   . Depression   . Disorders of sacrum   . Enthesopathy of hip region   . GERD (gastroesophageal reflux disease)   . Headache   . History of kidney stones   . Hypertension   . Sciatic neuritis   . Synovitis and tenosynovitis   . Vision abnormalities     Past Surgical History:  Procedure Laterality Date  . ABDOMINAL HYSTERECTOMY    . BACK SURGERY     2011  . BLADDER SURGERY  1985  . CESAREAN SECTION     x2  . RETINAL DETACHMENT SURGERY  9/13  . TRANSFORAMINAL LUMBAR INTERBODY FUSION W/ MIS 1 LEVEL Right 04/26/2018   Procedure: Right Lumbar four-five Minimally invasive Transforaminal lumbar interbody fusion;  Surgeon: Judith Part, MD;  Location: [facility];  Service: Neurosurgery;  Laterality: Right;    There were no vitals filed for this visit.  Subjective Assessment - 02/07/19 1143    Subjective  Patient states no particular bouts of dizziness but still feels unsteady. She reports walking with RW and when trying to walk with a cane feels "wobbly".    Pertinent History   Osteoporosis, HTN, Hx of Lumbar radiculopathy and facet arthropathy-s/p Lumbar laminectomy 04/26/2018, hx of L1 compression fx 03/2018, L rotator cuff and bicep tendonitis, hx of diplopia-pt's ophthalmologist is aware, hx of R retinal detachment (had surgery for this 10/2011), hx of TIA per pt, hx of LLD, HLD, depression, HA, arthritis, Cervical facet syndrome    Patient Stated Goals  To be able to walk straight without using an AD (has been using RW, SPC since 3/20 surgery)    Currently in Pain?  No/denies    Pain Onset  More than a month ago         [facility] PT Assessment - 02/07/19 1154      Strength   Strength Assessment Site  Hip;Knee;Ankle    Right/Left Hip  Right;Left    Right Hip Flexion  3+/5    Left Hip Flexion  4-/5    Right/Left Knee  Right;Left    Right Knee Flexion  3/5    Right Knee Extension  3+/5    Left Knee Flexion  4-/5    Left Knee Extension  4/5    Right/Left Ankle  Right;Left    Right Ankle Dorsiflexion  4/5    Left Ankle Dorsiflexion  4/5      Transfers   Sit to Stand Details (  indicate cue type and reason)  Continued focus on keeping eyes focused on stable target while transitioning from sit <> stand to improve stability     Stand to Sit Details  Continued focus on keeping eyes fixed on stable target when transitioning from stand <> sit to improve stability      Ambulation/Gait   Ambulation/Gait  Yes    Ambulation/Gait Assistance  4: Min guard;3: Mod assist    Ambulation/Gait Assistance Details  Entered clinic with RW and required supervision/mod-I, ambulated during session with SPC requiring min guard for safety and min-modA when demonstrating lateropulsion and scissoring to the left. Pt inconsistently demonstrated lateropulsion to the L during gait with dynamic movements and turning.     Ambulation Distance (Feet)  200 Feet    Assistive device  Straight cane   in R hand   Gait Pattern  Step-through pattern;Decreased step length - right;Decreased step length -  left;Decreased stride length;Decreased dorsiflexion - right;Decreased dorsiflexion - left;Shuffle;Scissoring;Narrow base of support   intermittent lateropulsion to the L    Ambulation Surface  Level;Indoor    Stairs  Yes    Stairs Assistance  4: Min guard    Stair Management Technique  Two rails;Step to pattern   slow, significant forward trunk lean    Number of Stairs  4    Height of Stairs  6      Standardized Balance Assessment   Standardized Balance Assessment  Dynamic Gait Index      Dynamic Gait Index   Level Surface  Severe Impairment    Change in Gait Speed  Severe Impairment    Gait with Horizontal Head Turns  Severe Impairment    Gait with Vertical Head Turns  Severe Impairment    Gait and Pivot Turn  Severe Impairment    Step Over Obstacle  Severe Impairment    Step Around Obstacles  Severe Impairment    Steps  Severe Impairment    Total Score  0    DGI comment:  pt needed complete hands-on assistance, lateropulsion to the L intermittently throughout entire assessment, difficulty stopping self to steady once lateropulsion began requiring assistance          Vestibular Assessment - 02/07/19 1211      Dix-Hallpike Left   Dix-Hallpike Left Duration  ~30 seconds     Dix-Hallpike Left Symptoms  Upbeat, left rotatory nystagmus   Difficult to assess upbeating or downbeating      Positional Sensitivities   Sit to Supine  No dizziness   peristent L rotary downbeat latency 5-10 sec   Supine to Left Side  Mild dizziness    Supine to Right Side  Mild dizziness    Supine to Sitting  Moderate dizziness    Right Hallpike  Mild dizziness   no nystagmus visible    Up from Right Hallpike  Moderate dizziness    Up from Left Hallpike  Moderate dizziness    Nose to Right Knee  Lightheadedness    Right Knee to Sitting  Lightedness    Nose to Left Knee  Mild dizziness    Left Knee to Sitting  Mild dizziness    Head Turning x 5  No dizziness    Head Nodding x 5  Mild dizziness     Pivot Right in Standing  Mild dizziness    Pivot Left in Standing  Mild dizziness    Rolling Right  Mild dizziness    Rolling Left  Mild dizziness  Positional Sensitivities Comments  L hallpike L rotary nystagmus, difficult to assess up or down beating, pt reported mild dizziness               [facility] Adult PT Treatment/Exercise - 02/07/19 1154      Transfers   Transfers  Sit to Stand;Stand to Sit    Sit to Stand  5: Supervision    Stand to Sit  5: Supervision      Ambulation/Gait   Gait velocity  0.98 ft/sec with [facility]      Vestibular Treatment/Exercise - 02/07/19 1225      Vestibular Treatment/Exercise   Vestibular Treatment Provided  Canalith Repositioning    Canalith Repositioning  Epley Manuever Left       EPLEY MANUEVER LEFT   Number of Reps   1    Overall Response   Improved Symptoms     RESPONSE DETAILS LEFT  rebound nystagmus when going from sidelying > sitting,               PT Short Term Goals - 02/07/19 1457      PT SHORT TERM GOAL #1   Title  Pt will be IND with HEP to improve dizziness, strength, and balance. TARGET DATE FOR ALL STGS: 02/04/19    Baseline  02/07/19: dependent to date    Time  4    Period  Weeks    Status  On-going      PT SHORT TERM GOAL #2   Title  Perform MMT, BERG/DGI and write goals as indicated.    Baseline  02/07/19: MET    Time  4    Period  Weeks    Status  Achieved      PT SHORT TERM GOAL #3   Title  Pt will report dizziness </=3/10 during all positional testing to improve safety during ADLs and QOL.    Time  4    Period  Weeks    Status  Not Met      PT SHORT TERM GOAL #4   Title  Pt will improve gait speed to >/=1.76f/sec with LRAD to decr. falls risk.    Baseline  02/07/19: 0.98 ft./sec with SPC and min-modA from therapist    Time  4    Period  Weeks    Status  Partially Met      PT SHORT TERM GOAL #5   Title  Pt will amb. 200' over even terrain with LRAD and S to improve functional mobility.     Baseline  02/07/19: ambulates with SPC and min-modA from therapist around gym throughout session/DGI assessment    Time  4    Period  Weeks    Status  Not Met        PT Long Term Goals - 02/07/19 1605      PT LONG TERM GOAL #1   Title  Pt will report dizziness </=1/10 during all functional mobility and positional testing to improve safety during functional mobility. (TARGET DATE FOR ALL LTGS: 03/03/18; when evaluating LTGs re-set target date to 04/07/19)    Time  8    Period  Weeks    Status  New    Target Date  03/04/19      PT LONG TERM GOAL #2   Title  Pt will amb.230' over level, indoor surfaces with LRAD and supervision with no evidence of lateropulsion to improve functional mobility    Time  8    Period  Weeks  Status  Revised      PT LONG TERM GOAL #3   Title  Pt will improve gait speed to >/=1.42f/sec with LRAD to decr. falls risk.    Baseline  .98 ft/sec    Time  8    Period  Weeks    Status  Revised      PT LONG TERM GOAL #4   Title  Patient will improve left lower extremity strength globally to 4/5 to increase safety during gait and functional independence.    Time  8    Period  Weeks    Status  New      PT LONG TERM GOAL #5   Title  Pt will be able to perform DGI with LRAD and without therapist providing min-mod A    Baseline  0/24 on 12/14 due to min-mod A required from therapist for full assessment    Time  8    Period  Weeks    Status  New            Plan - 02/07/19 1456    Clinical Impression Statement  Pt entered clinic with minimal instability during gait compared to previous sessions. She continues to demonstrate L pBPPV noted by upbeating and L rotary nystagmus upon testing that reduces when followed by CRM treatment. Pt demonstrated mild motion sensitivity in most positions tested and moderate when coming from supine to sit or out of dix hallpike testing position. Therapy session focused on further gait and strength assessment revealing global  lower extremity weakness greater on the right side compared to left and need for an AD and min-modA from therapist during gait. DGI = 0/24 indicating patient requires intervention to improve independence and safety while ambulating with LRAD. She had many intermittent episodes of lateropulsion throughout the session when ambulating with a SPC infrequently requiring min-modA from therapist to steady. Pt will continue to benefit from skilled therapy services to address deficits and progress towards achieving functional goals.    Personal Factors and Comorbidities  Age;Comorbidity 3+;Behavior Pattern    Comorbidities  Osteoporosis, HTN, Hx of Lumbar radiculopathy and facet arthropathy-s/p Lumbar laminectomy 04/26/2018, hx of L1 compression fx 03/2018, L rotator cuff and bicep tendonitis, hx of diplopia-pt's neurologist and ophthalmologist is aware, hx of R retinal detachment (had surgery for this 10/2011), hx of suspected TIA per pt, hx of LLD, HLD, depression, HA, arthritis, Cervical facet syndrome    Examination-Activity Limitations  Bathing;Locomotion Level;Transfers;Bed Mobility;Carry;Squat;Stairs;Stand;Lift;Bend    Examination-Participation Restrictions  Driving;Yard Work;Community Activity    Stability/Clinical Decision Making  Evolving/Moderate complexity    Rehab Potential  Good    PT Frequency  2x / week    PT Duration  8 weeks    PT Treatment/Interventions  ADLs/Self Care Home Management;Biofeedback;Canalith Repostioning;DME Instruction;Balance training;Therapeutic exercise;Therapeutic activities;Patient/family education;Functional mobility training;Stair training;Gait training;Neuromuscular re-education;Vestibular    PT Next Visit Plan  Reassess for B pBPPV, aBPPV and treat as indicated -- when reassessing, use table that allows for neck to extend comfortably.  How is she doing with walking at home?  Progress x1 viewing?    Consulted and Agree with Plan of Care  Patient       Patient will benefit  from skilled therapeutic intervention in order to improve the following deficits and impairments:  Abnormal gait, Decreased range of motion, Dizziness, Decreased mobility, Impaired sensation, Postural dysfunction, Decreased balance, Decreased knowledge of use of DME, Impaired flexibility  Visit Diagnosis: Dizziness and giddiness  Other abnormalities of gait and mobility  Other disturbances of skin sensation  Unsteadiness on feet     Problem List Patient Active Problem List   Diagnosis Date Noted  . Biceps tendonitis on left 08/18/2018  . Rotator cuff tendonitis, left 06/08/2018  . Lumbar radiculopathy 04/26/2018  . Lumbar radicular pain 07/29/2016  . Myoclonus 09/19/2015  . Therapeutic opioid induced constipation 07/25/2015  . White matter changes 02/07/2015  . Essential hypertension 02/07/2015  . HLD (hyperlipidemia) 02/07/2015  . Low back pain 02/07/2015  . BPPV (benign paroxysmal positional vertigo) 02/07/2015  . Abnormality of gait 02/07/2015  . Lumbar facet arthropathy 07/05/2014  . Acquired unequal leg length on left 08/08/2011  . Lumbar post-laminectomy syndrome 06/11/2011  . Sacroiliac joint dysfunction 06/11/2011     Juliann Pulse SPT 02/07/2019, 4:25 PM  Juliann Pulse 02/07/2019, 4:52 PM  [facility] [address] [place], [place], [address] Phone: [phone]   Fax:  [phone]  Name: Alexandria Mclaughlin MRN: [id number] Date of Birth: 1941-02-15

## 2019-02-10 ENCOUNTER — Ambulatory Visit: Payer: Medicare HMO | Admitting: Physical Therapy

## 2019-02-10 ENCOUNTER — Other Ambulatory Visit: Payer: Self-pay

## 2019-02-10 ENCOUNTER — Encounter: Payer: Self-pay | Admitting: Physical Therapy

## 2019-02-10 DIAGNOSIS — R2689 Other abnormalities of gait and mobility: Secondary | ICD-10-CM

## 2019-02-10 DIAGNOSIS — H8113 Benign paroxysmal vertigo, bilateral: Secondary | ICD-10-CM

## 2019-02-10 DIAGNOSIS — R42 Dizziness and giddiness: Secondary | ICD-10-CM

## 2019-02-10 DIAGNOSIS — R2681 Unsteadiness on feet: Secondary | ICD-10-CM

## 2019-02-10 DIAGNOSIS — R209 Unspecified disturbances of skin sensation: Secondary | ICD-10-CM | POA: Diagnosis not present

## 2019-02-10 DIAGNOSIS — R208 Other disturbances of skin sensation: Secondary | ICD-10-CM

## 2019-02-10 NOTE — Therapy (Signed)
[facility] [address] [place], [place], [address] Phone: [phone]   Fax:  [phone]  Physical Therapy Treatment  Patient Details  Name: Alexandria Mclaughlin MRN: [id number] Date of Birth: 1940-10-04 Referring Provider (PT): Dr. Alger Simons   Encounter Date: 02/10/2019  PT End of Session - 02/10/19 1547    Visit Number  6    Number of Visits  17    Date for PT Re-Evaluation  04/07/19   to accommodate schedule wait time   Authorization Type  aetna Medicare (progress note every 10th visit)    PT Start Time  0340    PT Stop Time  0425    PT Time Calculation (min)  45 min    Equipment Utilized During Treatment  --    Activity Tolerance  Patient tolerated treatment well    Behavior During Therapy  [name] for tasks assessed/performed       Past Medical History:  Diagnosis Date  . Cervical facet syndrome   . Depression   . Disorders of sacrum   . Enthesopathy of hip region   . GERD (gastroesophageal reflux disease)   . Headache   . History of kidney stones   . Hypertension   . Sciatic neuritis   . Synovitis and tenosynovitis   . Vision abnormalities     Past Surgical History:  Procedure Laterality Date  . ABDOMINAL HYSTERECTOMY    . BACK SURGERY     2011  . BLADDER SURGERY  1985  . CESAREAN SECTION     x2  . RETINAL DETACHMENT SURGERY  9/13  . TRANSFORAMINAL LUMBAR INTERBODY FUSION W/ MIS 1 LEVEL Right 04/26/2018   Procedure: Right Lumbar four-five Minimally invasive Transforaminal lumbar interbody fusion;  Surgeon: Judith Part, MD;  Location: [facility];  Service: Neurosurgery;  Laterality: Right;    There were no vitals filed for this visit.  Subjective Assessment - 02/10/19 1530    Subjective  "I'm better today" and reports getting dizziness when getting up from lying down however it's not as severe. She says her walking has improved and that she has been trying to use the cane at home but still needs  to grab hold of the wall occasionally.    Pertinent History  Osteoporosis, HTN, Hx of Lumbar radiculopathy and facet arthropathy-s/p Lumbar laminectomy 04/26/2018, hx of L1 compression fx 03/2018, L rotator cuff and bicep tendonitis, hx of diplopia-pt's ophthalmologist is aware, hx of R retinal detachment (had surgery for this 10/2011), hx of TIA per pt, hx of LLD, HLD, depression, HA, arthritis, Cervical facet syndrome    Patient Stated Goals  To be able to walk straight without using an AD (has been using RW, SPC since 3/20 surgery)    Currently in Pain?  No/denies    Pain Onset  More than a month ago             Vestibular Assessment - 02/10/19 1548      Positional Testing   Sidelying Test  Sidelying Left      Sidelying Left   Sidelying Left Duration  ~30 seconds    Sidelying Left Symptoms  Downbeat, left rotatory nystagmus               [facility] Adult PT Treatment/Exercise - 02/10/19 1638      Transfers   Transfers  Sit to Stand;Stand to Sit    Sit to Stand  5: Supervision    Stand to Sit  5: Supervision      Ambulation/Gait   Ambulation/Gait  Yes    Ambulation/Gait Assistance  5: Supervision;4: Min guard    Ambulation/Gait Assistance Details  improved stability entering clinic and throughout treatment session, no episodes of lateropulsion, pt continues to focus on stable object for improved stability     Ambulation Distance (Feet)  50 Feet    Assistive device  Rolling walker    Gait Pattern  Step-through pattern;Decreased step length - right;Decreased step length - left;Decreased stride length;Decreased dorsiflexion - right;Decreased dorsiflexion - left;Shuffle;Scissoring;Narrow base of support    Ambulation Surface  Level;Indoor      Vestibular Treatment/Exercise - 02/10/19 1552      Vestibular Treatment/Exercise   Vestibular Treatment Provided  Canalith Repositioning    Canalith Repositioning  Comment    Habituation Exercises  --    Gaze Exercises  X1 Viewing  Horizontal;X1 Viewing Vertical      OTHER   Comment  On inverted table, in deep head hang position pt had downbeating nystagmus that lasted ~60s, x2 reps -- 2nd rep of tx pt's nystagmus lasted ~30s. Improved symptoms afterwards; PT assisted pt in performing a modified brandt daroff for the L anterior canal 30s holds in each position x3 reps, dizziness unchanged during treatment      Nestor Lewandowsky   Number of Reps   --      X1 Viewing Horizontal   Foot Position  seated with back unsupported, monocular to dec diplopia     Reps  1    Comments  x30s each repetition       X1 Viewing Vertical   Foot Position  seated with back unsupported    Reps  1    Comments  x30s, binocular pt demonstrated swaying anterior/posterior without back support            PT Education - 02/10/19 1641    Education Details  Progress VORx1 viewing to 1 minute duration    Person(s) Educated  Patient    Methods  Explanation;Verbal cues    Comprehension  Verbalized understanding       PT Short Term Goals - 02/07/19 1457      PT SHORT TERM GOAL #1   Title  Pt will be IND with HEP to improve dizziness, strength, and balance. TARGET DATE FOR ALL STGS: 02/04/19    Baseline  02/07/19: dependent to date    Time  4    Period  Weeks    Status  On-going      PT SHORT TERM GOAL #2   Title  Perform MMT, BERG/DGI and write goals as indicated.    Baseline  02/07/19: MET    Time  4    Period  Weeks    Status  Achieved      PT SHORT TERM GOAL #3   Title  Pt will report dizziness </=3/10 during all positional testing to improve safety during ADLs and QOL.    Time  4    Period  Weeks    Status  Not Met      PT SHORT TERM GOAL #4   Title  Pt will improve gait speed to >/=1.71f/sec with LRAD to decr. falls risk.    Baseline  02/07/19: 0.98 ft./sec with SPC and min-modA from therapist    Time  4    Period  Weeks    Status  Partially Met      PT SHORT TERM GOAL #5   Title  Pt will amb. 200' over even terrain  with LRAD and S to improve functional mobility.    Baseline  02/07/19: ambulates with SPC and min-modA from therapist around gym throughout session/DGI assessment    Time  4    Period  Weeks    Status  Not Met        PT Long Term Goals - 02/07/19 1605      PT LONG TERM GOAL #1   Title  Pt will report dizziness </=1/10 during all functional mobility and positional testing to improve safety during functional mobility. (TARGET DATE FOR ALL LTGS: 03/03/18; when evaluating LTGs re-set target date to 04/07/19)    Time  8    Period  Weeks    Status  New    Target Date  03/04/19      PT LONG TERM GOAL #2   Title  Pt will amb.230' over level, indoor surfaces with LRAD and supervision with no evidence of lateropulsion to improve functional mobility    Time  8    Period  Weeks    Status  Revised      PT LONG TERM GOAL #3   Title  Pt will improve gait speed to >/=1.34f/sec with LRAD to decr. falls risk.    Baseline  .98 ft/sec    Time  8    Period  Weeks    Status  Revised      PT LONG TERM GOAL #4   Title  Patient will improve left lower extremity strength globally to 4/5 to increase safety during gait and functional independence.    Time  8    Period  Weeks    Status  New      PT LONG TERM GOAL #5   Title  Pt will be able to perform DGI with LRAD and without therapist providing min-mod A    Baseline  0/24 on 12/14 due to min-mod A required from therapist for full assessment    Time  8[place]- 02/10/19 1547    Clinical Impression Statement  Patient continues to demonstrate downbeating nystagmus with positional vertigo testing. Deep head hang and a modified brandt daroff maneuver were used for CRM. Patient continued to have dizziness symptoms unchanged throughout maneuvers. Patient entered clinic demonstrating much more stability with gait and appeared/stated more confidence with no episodes of lateropulsion or need for >supervision-minA.  PT progressed gaze stabilization x1 minute for HEP. Patient may benefit from skilled therapy services to address deficits.    Personal Factors and Comorbidities  Age;Comorbidity 3+;Behavior Pattern    Comorbidities  Osteoporosis, HTN, Hx of Lumbar radiculopathy and facet arthropathy-s/p Lumbar laminectomy 04/26/2018, hx of L1 compression fx 03/2018, L rotator cuff and bicep tendonitis, hx of diplopia-pt's neurologist and ophthalmologist is aware, hx of R retinal detachment (had surgery for this 10/2011), hx of suspected TIA per pt, hx of LLD, HLD, depression, HA, arthritis, Cervical facet syndrome    Examination-Activity Limitations  Bathing;Locomotion Level;Transfers;Bed Mobility;Carry;Squat;Stairs;Stand;Lift;Bend    Examination-Participation Restrictions  Driving;Yard Work;Community Activity    Stability/Clinical Decision Making  Evolving/Moderate complexity    Rehab Potential  Good    PT Frequency  2x / week    PT Duration  8 weeks    PT Treatment/Interventions  ADLs/Self Care Home Management;Biofeedback;Canalith Repostioning;DME Instruction;Balance training;Therapeutic exercise;Therapeutic activities;Patient/family education;Functional mobility training;Stair training;Gait training;Neuromuscular re-education;Vestibular  PT Next Visit Plan  Reassess for B pBPPV, aBPPV and treat as indicated -- when reassessing, use table that allows for neck to extend comfortably.  Progress x1 viewing? Standing balance, gait with cane, habituation    Consulted and Agree with Plan of Care  Patient       Patient will benefit from skilled therapeutic intervention in order to improve the following deficits and impairments:  Abnormal gait, Decreased range of motion, Dizziness, Decreased mobility, Impaired sensation, Postural dysfunction, Decreased balance, Decreased knowledge of use of DME, Impaired flexibility  Visit Diagnosis: Dizziness and giddiness  Other abnormalities of gait and mobility  Other disturbances  of skin sensation  Unsteadiness on feet  BPPV (benign paroxysmal positional vertigo), bilateral     Problem List Patient Active Problem List   Diagnosis Date Noted  . Biceps tendonitis on left 08/18/2018  . Rotator cuff tendonitis, left 06/08/2018  . Lumbar radiculopathy 04/26/2018  . Lumbar radicular pain 07/29/2016  . Myoclonus 09/19/2015  . Therapeutic opioid induced constipation 07/25/2015  . White matter changes 02/07/2015  . Essential hypertension 02/07/2015  . HLD (hyperlipidemia) 02/07/2015  . Low back pain 02/07/2015  . BPPV (benign paroxysmal positional vertigo) 02/07/2015  . Abnormality of gait 02/07/2015  . Lumbar facet arthropathy 07/05/2014  . Acquired unequal leg length on left 08/08/2011  . Lumbar post-laminectomy syndrome 06/11/2011  . Sacroiliac joint dysfunction 06/11/2011    Juliann Pulse  SPT 02/10/2019, 4:41 PM  [facility] [address] [place], [place], [address] Phone: [phone]   Fax:  [phone]  Name: Alexandria Mclaughlin MRN: [id number] Date of Birth: [birth year]

## 2019-02-14 ENCOUNTER — Ambulatory Visit: Payer: Medicare HMO | Admitting: Physical Therapy

## 2019-02-14 ENCOUNTER — Encounter: Payer: Self-pay | Admitting: Physical Therapy

## 2019-02-14 ENCOUNTER — Other Ambulatory Visit: Payer: Self-pay

## 2019-02-14 DIAGNOSIS — R2681 Unsteadiness on feet: Secondary | ICD-10-CM | POA: Diagnosis not present

## 2019-02-14 DIAGNOSIS — R209 Unspecified disturbances of skin sensation: Secondary | ICD-10-CM | POA: Diagnosis not present

## 2019-02-14 DIAGNOSIS — R208 Other disturbances of skin sensation: Secondary | ICD-10-CM

## 2019-02-14 DIAGNOSIS — R2689 Other abnormalities of gait and mobility: Secondary | ICD-10-CM | POA: Diagnosis not present

## 2019-02-14 DIAGNOSIS — R42 Dizziness and giddiness: Secondary | ICD-10-CM | POA: Diagnosis not present

## 2019-02-14 DIAGNOSIS — H8113 Benign paroxysmal vertigo, bilateral: Secondary | ICD-10-CM | POA: Diagnosis not present

## 2019-02-14 NOTE — Therapy (Signed)
[facility] [address] [place], [place], [address] Phone: [phone]   Fax:  [phone]  Physical Therapy Treatment  Patient Details  Name: Alexandria Mclaughlin MRN: [id number] Date of Birth: [birth year] Referring Provider (PT): Dr. Alger Simons   Encounter Date: 02/14/2019  PT End of Session - 02/14/19 1523    Visit Number  7    Number of Visits  17    Date for PT Re-Evaluation  04/07/19   to accommodate schedule wait time   Authorization Type  aetna Medicare (progress note every 10th visit)    PT Start Time  1540    PT Stop Time  1622    PT Time Calculation (min)  42 min    Activity Tolerance  Patient tolerated treatment well    Behavior During Therapy  [facility] for tasks assessed/performed       Past Medical History:  Diagnosis Date  . Cervical facet syndrome   . Depression   . Disorders of sacrum   . Enthesopathy of hip region   . GERD (gastroesophageal reflux disease)   . Headache   . History of kidney stones   . Hypertension   . Sciatic neuritis   . Synovitis and tenosynovitis   . Vision abnormalities     Past Surgical History:  Procedure Laterality Date  . ABDOMINAL HYSTERECTOMY    . BACK SURGERY     2011  . BLADDER SURGERY  1985  . CESAREAN SECTION     x2  . RETINAL DETACHMENT SURGERY  9/13  . TRANSFORAMINAL LUMBAR INTERBODY FUSION W/ MIS 1 LEVEL Right 04/26/2018   Procedure: Right Lumbar four-five Minimally invasive Transforaminal lumbar interbody fusion;  Surgeon: Judith Part, MD;  Location: [facility];  Service: Neurosurgery;  Laterality: Right;    There were no vitals filed for this visit.  Subjective Assessment - 02/14/19 1523    Subjective  She is doing better today. "My dizziness is almost completely done and I haven't been walking into any walls recently."    Pertinent History  Osteoporosis, HTN, Hx of Lumbar radiculopathy and facet arthropathy-s/p Lumbar laminectomy 04/26/2018, hx of L1  compression fx 03/2018, L rotator cuff and bicep tendonitis, hx of diplopia-pt's ophthalmologist is aware, hx of R retinal detachment (had surgery for this 10/2011), hx of TIA per pt, hx of LLD, HLD, depression, HA, arthritis, Cervical facet syndrome    Patient Stated Goals  To be able to walk straight without using an AD (has been using RW, SPC since 3/20 surgery)    Currently in Pain?  No/denies    Pain Onset  More than a month ago             Vestibular Assessment - 02/14/19 1543      Positional Testing   Dix-Hallpike  Dix-Hallpike Left    Horizontal Canal Testing  Horizontal Canal Left      Dix-Hallpike Left   Dix-Hallpike Left Duration  >60 seconds, no sx report    Dix-Hallpike Left Symptoms  --   left rotatory      Horizontal Canal Left   Horizontal Canal Left Duration  0    Horizontal Canal Left Symptoms  Normal               [facility] Adult PT Treatment/Exercise - 02/14/19 1635      Transfers   Transfers  Sit to Stand;Stand to Sit    Sit to Stand  5: Supervision    Stand  to Sit  5: Supervision      Ambulation/Gait   Ambulation/Gait  Yes    Ambulation/Gait Assistance  5: Supervision    Ambulation/Gait Assistance Details  Improved stability entering/exiting clinic, no episodes of lateropulsion or ataxia     Ambulation Distance (Feet)  50 Feet    Assistive device  Rolling walker    Gait Pattern  Step-through pattern;Decreased step length - right;Decreased step length - left;Decreased stride length;Decreased dorsiflexion - right;Decreased dorsiflexion - left;Shuffle;Scissoring;Narrow base of support    Ambulation Surface  Level;Indoor      Vestibular Treatment/Exercise - 02/14/19 1625      Vestibular Treatment/Exercise   Vestibular Treatment Provided  Gaze;Habituation    Habituation Exercises  Nestor Lewandowsky    Gaze Exercises  X1 Viewing Horizontal;X1 Viewing Vertical      OTHER   Comment  Modified brandt daroff exercise for HEP. Pt performed modified L  sidelying by coming down to elbow with gaze towards ceiling for a few seconds and then coming up to sitting and finding midline stability. Repeated x5.       Nestor Lewandowsky   Number of Reps   3    Symptom Description   mild dizziness sit > sidelying in both direction, increased dizziness sidelying > sit (greater when coming up from L sidelying) demonstrating inability to maintain midline when coming up to sitting; 3rd rep minimal cues to assess pt's ability to correctly and safely perform at home   continue in session until pt able to stabilize midline     X1 Viewing Horizontal   Foot Position  seated with back unsupported, monocular to dec diplopia     Reps  1    Comments  x60s each repetition, no sx       X1 Viewing Vertical   Foot Position  seated with back unsupported, binocular     Reps  1    Comments  x60s, no sx             PT Education - 02/14/19 1632    Education Details  Updated HEP, see pt instructions    Person(s) Educated  Patient    Methods  Explanation;Handout    Comprehension  Verbalized understanding       PT Short Term Goals - 02/07/19 1457      PT SHORT TERM GOAL #1   Title  Pt will be IND with HEP to improve dizziness, strength, and balance. TARGET DATE FOR ALL STGS: 02/04/19    Baseline  02/07/19: dependent to date    Time  4    Period  Weeks    Status  On-going      PT SHORT TERM GOAL #2   Title  Perform MMT, BERG/DGI and write goals as indicated.    Baseline  02/07/19: MET    Time  4    Period  Weeks    Status  Achieved      PT SHORT TERM GOAL #3   Title  Pt will report dizziness </=3/10 during all positional testing to improve safety during ADLs and QOL.    Time  4    Period  Weeks    Status  Not Met      PT SHORT TERM GOAL #4   Title  Pt will improve gait speed to >/=1.21f/sec with LRAD to decr. falls risk.    Baseline  02/07/19: 0.98 ft./sec with SPC and min-modA from therapist    Time  4    Period  Weeks    Status  Partially Met       PT SHORT TERM GOAL #5   Title  Pt will amb. 200' over even terrain with LRAD and S to improve functional mobility.    Baseline  02/07/19: ambulates with SPC and min-modA from therapist around gym throughout session/DGI assessment    Time  4    Period  Weeks    Status  Not Met        PT Long Term Goals - 02/07/19 1605      PT LONG TERM GOAL #1   Title  Pt will report dizziness </=1/10 during all functional mobility and positional testing to improve safety during functional mobility. (TARGET DATE FOR ALL LTGS: 03/03/18; when evaluating LTGs re-set target date to 04/07/19)    Time  8    Period  Weeks    Status  New    Target Date  03/04/19      PT LONG TERM GOAL #2   Title  Pt will amb.230' over level, indoor surfaces with LRAD and supervision with no evidence of lateropulsion to improve functional mobility    Time  8    Period  Weeks    Status  Revised      PT LONG TERM GOAL #3   Title  Pt will improve gait speed to >/=1.72f/sec with LRAD to decr. falls risk.    Baseline  .98 ft/sec    Time  8    Period  Weeks    Status  Revised      PT LONG TERM GOAL #4   Title  Patient will improve left lower extremity strength globally to 4/5 to increase safety during gait and functional independence.    Time  8    Period  Weeks    Status  New      PT LONG TERM GOAL #5   Title  Pt will be able to perform DGI with LRAD and without therapist providing min-mod A    Baseline  0/24 on 12/14 due to min-mod A required from therapist for full assessment    Time  8    Period  Weeks    Status  New            Plan - 02/14/19 1524    Clinical Impression Statement  Pt entered clinical with improved stability during gait only requiring supervision for safety. She had no symptoms with positional vertigo testing. Pt tolerated progression of x1 viewing to 60seconds both directions without c/o dizziness. Session focused significantly on habituation. Pt demonstrates instability and increase in  dizziness primarily with sidelying > sit predominantly coming up from the L requiring external support to stabilize at midline. Cues for forward gaze and throughout brandt daroff & modified exercise for technique. Will continue to assess and progress as indicated. Pt can benefit from skilled therapy services to address deficits.    Personal Factors and Comorbidities  Age;Comorbidity 3+;Behavior Pattern    Comorbidities  Osteoporosis, HTN, Hx of Lumbar radiculopathy and facet arthropathy-s/p Lumbar laminectomy 04/26/2018, hx of L1 compression fx 03/2018, L rotator cuff and bicep tendonitis, hx of diplopia-pt's neurologist and ophthalmologist is aware, hx of R retinal detachment (had surgery for this 10/2011), hx of suspected TIA per pt, hx of LLD, HLD, depression, HA, arthritis, Cervical facet syndrome    Examination-Activity Limitations  Bathing;Locomotion Level;Transfers;Bed Mobility;Carry;Squat;Stairs;Stand;Lift;Bend    Examination-Participation Restrictions  Driving;Yard Work;Community Activity    Stability/Clinical Decision Making  Evolving/Moderate complexity  Rehab Potential  Good    PT Frequency  2x / week    PT Duration  8 weeks    PT Treatment/Interventions  ADLs/Self Care Home Management;Biofeedback;Canalith Repostioning;DME Instruction;Balance training;Therapeutic exercise;Therapeutic activities;Patient/family education;Functional mobility training;Stair training;Gait training;Neuromuscular re-education;Vestibular    PT Next Visit Plan  Reassess for B pBPPV, aBPPV and treat as indicated -- when reassessing, use table that allows for neck to extend comfortably.  Progress x1 viewing? Standing balance, gait with cane, habituation. assess brandt daroff modification & progress?    Consulted and Agree with Plan of Care  Patient       Patient will benefit from skilled therapeutic intervention in order to improve the following deficits and impairments:  Abnormal gait, Decreased range of motion,  Dizziness, Decreased mobility, Impaired sensation, Postural dysfunction, Decreased balance, Decreased knowledge of use of DME, Impaired flexibility  Visit Diagnosis: Dizziness and giddiness  Other abnormalities of gait and mobility  Other disturbances of skin sensation  Unsteadiness on feet  BPPV (benign paroxysmal positional vertigo), bilateral     Problem List Patient Active Problem List   Diagnosis Date Noted  . Biceps tendonitis on left 08/18/2018  . Rotator cuff tendonitis, left 06/08/2018  . Lumbar radiculopathy 04/26/2018  . Lumbar radicular pain 07/29/2016  . Myoclonus 09/19/2015  . Therapeutic opioid induced constipation 07/25/2015  . White matter changes 02/07/2015  . Essential hypertension 02/07/2015  . HLD (hyperlipidemia) 02/07/2015  . Low back pain 02/07/2015  . BPPV (benign paroxysmal positional vertigo) 02/07/2015  . Abnormality of gait 02/07/2015  . Lumbar facet arthropathy 07/05/2014  . Acquired unequal leg length on left 08/08/2011  . Lumbar post-laminectomy syndrome 06/11/2011  . Sacroiliac joint dysfunction 06/11/2011    Juliann Pulse SPT 02/14/2019, 4:36 PM  [facility] [address] [place], [place], [address] Phone: [phone]   Fax:  [phone]  Name: LIBERTA GIMPEL MRN: [id number] Date of Birth: 04-27-1940

## 2019-02-14 NOTE — Patient Instructions (Signed)
Gaze Stabilization: Sitting    Keeping eyes on target on wall 3-4 feet away, and move head side to side for _60___ seconds with one eye covered.  Switch eyes and repeat side to side for 60 seconds. Uncover both eyes and Repeat while moving head up and down for __60__ seconds. Do __2-3__ sessions per day.  Copyright  VHI. All rights reserved.   Gaze Stabilization: Tip Card  1.Target must remain in focus, not blurry, and appear stationary while head is in motion. 2.Perform exercises with small head movements (45 to either side of midline). 3.Increase speed of head motion so long as target is in focus. 4.If you wear eyeglasses, be sure you can see target through lens (therapist will give specific instructions for bifocal / progressive lenses). 5.These exercises may provoke dizziness or nausea. Work through these symptoms. If too dizzy, slow head movement slightly. Rest between each exercise. 6.Exercises demand concentration; avoid distractions.   IF WALKING DOWN HALLWAY:  Walk with walker, walk slow and keep your left foot slightly out to the side.   Keep your eyes focused on a stable target while walking forwards  Stop - When you turn, turn your head first to look at a stable target and then turn your body  Modified Nestor Lewandowsky to the L (see tx notes)

## 2019-02-22 ENCOUNTER — Other Ambulatory Visit: Payer: Self-pay

## 2019-02-22 ENCOUNTER — Ambulatory Visit: Payer: Medicare HMO | Admitting: Physical Therapy

## 2019-02-22 DIAGNOSIS — R2689 Other abnormalities of gait and mobility: Secondary | ICD-10-CM | POA: Diagnosis not present

## 2019-02-22 DIAGNOSIS — R2681 Unsteadiness on feet: Secondary | ICD-10-CM

## 2019-02-22 DIAGNOSIS — R42 Dizziness and giddiness: Secondary | ICD-10-CM | POA: Diagnosis not present

## 2019-02-22 DIAGNOSIS — H8113 Benign paroxysmal vertigo, bilateral: Secondary | ICD-10-CM | POA: Diagnosis not present

## 2019-02-22 DIAGNOSIS — R209 Unspecified disturbances of skin sensation: Secondary | ICD-10-CM | POA: Diagnosis not present

## 2019-02-23 ENCOUNTER — Encounter: Payer: Self-pay | Admitting: Physical Therapy

## 2019-02-23 NOTE — Therapy (Signed)
[facility] [address] [place], [place], [address] Phone: [phone]   Fax:  [phone]  Physical Therapy Treatment  Patient Details  Name: Alexandria Mclaughlin MRN: [id number] Date of Birth: 26-Feb-1940 Referring Provider (PT): Dr. Alger Simons   Encounter Date: 02/22/2019  PT End of Session - 02/23/19 1925    Visit Number  8    Number of Visits  17    Date for PT Re-Evaluation  04/07/19   to accommodate schedule wait time   Authorization Type  aetna Medicare (progress note every 10th visit)    PT Start Time  1535    PT Stop Time  1620    PT Time Calculation (min)  45 min    Activity Tolerance  Patient tolerated treatment well    Behavior During Therapy  [facility] for tasks assessed/performed       Past Medical History:  Diagnosis Date  . Cervical facet syndrome   . Depression   . Disorders of sacrum   . Enthesopathy of hip region   . GERD (gastroesophageal reflux disease)   . Headache   . History of kidney stones   . Hypertension   . Sciatic neuritis   . Synovitis and tenosynovitis   . Vision abnormalities     Past Surgical History:  Procedure Laterality Date  . ABDOMINAL HYSTERECTOMY    . BACK SURGERY     2011  . BLADDER SURGERY  1985  . CESAREAN SECTION     x2  . RETINAL DETACHMENT SURGERY  9/13  . TRANSFORAMINAL LUMBAR INTERBODY FUSION W/ MIS 1 LEVEL Right 04/26/2018   Procedure: Right Lumbar four-five Minimally invasive Transforaminal lumbar interbody fusion;  Surgeon: Judith Part, MD;  Location: [facility];  Service: Neurosurgery;  Laterality: Right;    There were no vitals filed for this visit.  Subjective Assessment - 02/22/19 1540    Subjective  Pt states she has made a huge improvement since she was here last week; pt reports no dizziness and does not use the cane in her house; pt is using [facility] for assistance with ambulation (not using RW today)    Pertinent History  Osteoporosis, HTN, Hx of  Lumbar radiculopathy and facet arthropathy-s/p Lumbar laminectomy 04/26/2018, hx of L1 compression fx 03/2018, L rotator cuff and bicep tendonitis, hx of diplopia-pt's ophthalmologist is aware, hx of R retinal detachment (had surgery for this 10/2011), hx of TIA per pt, hx of LLD, HLD, depression, HA, arthritis, Cervical facet syndrome    Patient Stated Goals  To be able to walk straight without using an AD (has been using RW, SPC since 3/20 surgery)    Currently in Pain?  No/denies    Pain Onset  More than a month ago                       [facility] Adult PT Treatment/Exercise - 02/23/19 0001      Transfers   Transfers  Sit to Stand    Sit to Stand  4: Min guard    Number of Reps  10 reps   5 reps w/ feet on floor; 5 reps feet on Airex (no UE support     Ambulation/Gait   Ambulation/Gait  Yes    Ambulation/Gait Assistance  4: Min guard    Ambulation/Gait Assistance Details  cues to keep feet separated as Lt foot has tendency to turn inward     Ambulation Distance (Feet)  230  Feet    Assistive device  None    Gait Pattern  Ataxic   mildy ataxic   Ambulation Surface  Level;Indoor          Balance Exercises - 02/23/19 1924      Balance Exercises: Standing   Standing Eyes Opened  Wide ([place]);Head turns;Foam/compliant surface;5 reps    Standing Eyes Closed  Wide (BOA);Head turns;Foam/compliant surface;5 reps    Other Standing Exercises  tap ups to 1st 6" step with UE support on rail prn with CGA; tap ups to 2nd step 5 reps each foot with CGA to min assist for recovery of LOB      NeuroRe-ed;  Pt performed SLS activity on each foot - touching 3 balance bubbles with each foot in 1/2 circle pattern 5 reps  With each foot with CGA to min assist for recovery of LOB  Stepping over and back of orange hurdle - 5 reps each leg - with UE support on counter prn with CGA - for improved SLS With weight shift    PT Short Term Goals - 02/23/19 1950      PT SHORT TERM GOAL #1    Title  Pt will be IND with HEP to improve dizziness, strength, and balance. TARGET DATE FOR ALL STGS: 02/04/19    Baseline  02/07/19: dependent to date    Time  4    Period  Weeks    Status  On-going      PT SHORT TERM GOAL #2   Title  Perform MMT, BERG/DGI and write goals as indicated.    Baseline  02/07/19: MET    Time  4    Period  Weeks    Status  Achieved      PT SHORT TERM GOAL #3   Title  Pt will report dizziness </=3/10 during all positional testing to improve safety during ADLs and QOL.    Time  4    Period  Weeks    Status  Not Met      PT SHORT TERM GOAL #4   Title  Pt will improve gait speed to >/=1.31f/sec with LRAD to decr. falls risk.    Baseline  02/07/19: 0.98 ft./sec with SPC and min-modA from therapist    Time  4    Period  Weeks    Status  Partially Met      PT SHORT TERM GOAL #5   Title  Pt will amb. 200' over even terrain with LRAD and S to improve functional mobility.    Baseline  02/07/19: ambulates with SPC and min-modA from therapist around gym throughout session/DGI assessment    Time  4    Period  Weeks    Status  Not Met        PT Long Term Goals - 02/23/19 1951      PT LONG TERM GOAL #1   Title  Pt will report dizziness </=1/10 during all functional mobility and positional testing to improve safety during functional mobility. (TARGET DATE FOR ALL LTGS: 03/03/18; when evaluating LTGs re-set target date to 04/07/19)    Time  8    Period  Weeks    Status  New      PT LONG TERM GOAL #2   Title  Pt will amb.230' over level, indoor surfaces with LRAD and supervision with no evidence of lateropulsion to improve functional mobility    Time  8    Period  Weeks    Status  Revised      PT LONG TERM GOAL #3   Title  Pt will improve gait speed to >/=1.9f/sec with LRAD to decr. falls risk.    Baseline  .98 ft/sec    Time  8    Period  Weeks    Status  Revised      PT LONG TERM GOAL #4   Title  Patient will improve left lower extremity strength  globally to 4/5 to increase safety during gait and functional independence.    Time  8    Period  Weeks    Status  New      PT LONG TERM GOAL #5   Title  Pt will be able to perform DGI with LRAD and without therapist providing min-mod A    Baseline  0/24 on 12/14 due to min-mod A required from therapist for full assessment    Time  8    Period  Weeks    Status  New            Plan - 02/22/19 1541    Clinical Impression Statement  Pt tolerated vestibular exercises well and only reported dizziness with trunk rotations while standing on pillows in corner - this exercise was discontinued upon pt's c/o incr. dizziness and request to take a break.  Pt states she is pleased with her progress and improvements in balance.    Personal Factors and Comorbidities  Age;Comorbidity 3+;Behavior Pattern    Comorbidities  Osteoporosis, HTN, Hx of Lumbar radiculopathy and facet arthropathy-s/p Lumbar laminectomy 04/26/2018, hx of L1 compression fx 03/2018, L rotator cuff and bicep tendonitis, hx of diplopia-pt's neurologist and ophthalmologist is aware, hx of R retinal detachment (had surgery for this 10/2011), hx of suspected TIA per pt, hx of LLD, HLD, depression, HA, arthritis, Cervical facet syndrome    Examination-Activity Limitations  Bathing;Locomotion Level;Transfers;Bed Mobility;Carry;Squat;Stairs;Stand;Lift;Bend    Examination-Participation Restrictions  Driving;Yard Work;Community Activity    Stability/Clinical Decision Making  Evolving/Moderate complexity    Rehab Potential  Good    PT Frequency  2x / week    PT Duration  8 weeks    PT Treatment/Interventions  ADLs/Self Care Home Management;Biofeedback;Canalith Repostioning;DME Instruction;Balance training;Therapeutic exercise;Therapeutic activities;Patient/family education;Functional mobility training;Stair training;Gait training;Neuromuscular re-education;Vestibular    PT Next Visit Plan  Balance and gait training - pt using SPC on 02-22-19   Continue to progress x 1 viewing to standing (monocular for horizontal, binocular for vertical)    Consulted and Agree with Plan of Care  Patient       Patient will benefit from skilled therapeutic intervention in order to improve the following deficits and impairments:  Abnormal gait, Decreased range of motion, Dizziness, Decreased mobility, Impaired sensation, Postural dysfunction, Decreased balance, Decreased knowledge of use of DME, Impaired flexibility  Visit Diagnosis: Other abnormalities of gait and mobility  Unsteadiness on feet     Problem List Patient Active Problem List   Diagnosis Date Noted  . Biceps tendonitis on left 08/18/2018  . Rotator cuff tendonitis, left 06/08/2018  . Lumbar radiculopathy 04/26/2018  . Lumbar radicular pain 07/29/2016  . Myoclonus 09/19/2015  . Therapeutic opioid induced constipation 07/25/2015  . White matter changes 02/07/2015  . Essential hypertension 02/07/2015  . HLD (hyperlipidemia) 02/07/2015  . Low back pain 02/07/2015  . BPPV (benign paroxysmal positional vertigo) 02/07/2015  . Abnormality of gait 02/07/2015  . Lumbar facet arthropathy 07/05/2014  . Acquired unequal leg length on left 08/08/2011  . Lumbar post-laminectomy syndrome 06/11/2011  . Sacroiliac  joint dysfunction 06/11/2011    [place], PT 02/23/2019, 7:52 PM  [facility] [address] [place], [place], [address] Phone: [phone]   Fax:  [phone]  Name: Alexandria Mclaughlin MRN: [id number] Date of Birth: April 17, 1940

## 2019-02-28 ENCOUNTER — Telehealth: Payer: Self-pay | Admitting: *Deleted

## 2019-02-28 NOTE — Telephone Encounter (Signed)
Alexandria Mclaughlin is asking for prednisone for her shoulder. Please advise.

## 2019-03-01 MED ORDER — METHYLPREDNISOLONE 4 MG PO TBPK: ORAL_TABLET | ORAL | 0 refills | Status: DC

## 2019-03-01 NOTE — Telephone Encounter (Signed)
Medrol dose pack sent to [facility] on [address]

## 2019-03-01 NOTE — Telephone Encounter (Signed)
Spoke to patient, she is going to contact the pharmacy

## 2019-03-18 ENCOUNTER — Telehealth: Payer: Self-pay | Admitting: *Deleted

## 2019-03-18 DIAGNOSIS — M961 Postlaminectomy syndrome, not elsewhere classified: Secondary | ICD-10-CM

## 2019-03-18 DIAGNOSIS — M5416 Radiculopathy, lumbar region: Secondary | ICD-10-CM

## 2019-03-18 DIAGNOSIS — M47816 Spondylosis without myelopathy or radiculopathy, lumbar region: Secondary | ICD-10-CM

## 2019-03-18 DIAGNOSIS — M533 Sacrococcygeal disorders, not elsewhere classified: Secondary | ICD-10-CM

## 2019-03-18 MED ORDER — FENTANYL 25 MCG/HR TD PT72: 1.0000 | MEDICATED_PATCH | TRANSDERMAL | 0 refills | Status: DC

## 2019-03-18 NOTE — Telephone Encounter (Signed)
Patient notified

## 2019-03-18 NOTE — Telephone Encounter (Signed)
RX sent

## 2019-03-18 NOTE — Telephone Encounter (Signed)
Patient left a message asking for a refill on her fentanyl. She will run out before her visit with Dr. Naaman Plummer on 03/23/2019

## 2019-03-21 ENCOUNTER — Institutional Professional Consult (permissible substitution): Payer: Medicare HMO | Admitting: Neurology

## 2019-03-23 ENCOUNTER — Encounter: Payer: Self-pay | Admitting: Physical Medicine & Rehabilitation

## 2019-03-23 ENCOUNTER — Other Ambulatory Visit: Payer: Self-pay

## 2019-03-23 ENCOUNTER — Encounter: Payer: Medicare Other | Attending: Physical Medicine and Rehabilitation | Admitting: Physical Medicine & Rehabilitation

## 2019-03-23 VITALS — BP 128/69 | HR 102 | Temp 98.0°F | Ht 63.0 in | Wt 136.0 lb

## 2019-03-23 DIAGNOSIS — M7582 Other shoulder lesions, left shoulder: Secondary | ICD-10-CM | POA: Diagnosis not present

## 2019-03-23 DIAGNOSIS — M7522 Bicipital tendinitis, left shoulder: Secondary | ICD-10-CM | POA: Insufficient documentation

## 2019-03-23 DIAGNOSIS — M47816 Spondylosis without myelopathy or radiculopathy, lumbar region: Secondary | ICD-10-CM | POA: Insufficient documentation

## 2019-03-23 DIAGNOSIS — M961 Postlaminectomy syndrome, not elsewhere classified: Secondary | ICD-10-CM | POA: Diagnosis not present

## 2019-03-23 DIAGNOSIS — M533 Sacrococcygeal disorders, not elsewhere classified: Secondary | ICD-10-CM | POA: Insufficient documentation

## 2019-03-23 DIAGNOSIS — H8110 Benign paroxysmal vertigo, unspecified ear: Secondary | ICD-10-CM | POA: Diagnosis not present

## 2019-03-23 DIAGNOSIS — M5416 Radiculopathy, lumbar region: Secondary | ICD-10-CM | POA: Diagnosis not present

## 2019-03-23 DIAGNOSIS — M2170 Unequal limb length (acquired), unspecified site: Secondary | ICD-10-CM | POA: Diagnosis not present

## 2019-03-23 DIAGNOSIS — R269 Unspecified abnormalities of gait and mobility: Secondary | ICD-10-CM

## 2019-03-23 MED ORDER — FENTANYL 25 MCG/HR TD PT72: 1.0000 | MEDICATED_PATCH | TRANSDERMAL | 0 refills | Status: DC

## 2019-03-23 MED ORDER — OXYCODONE-ACETAMINOPHEN 10-325 MG PO TABS: 1.0000 | ORAL_TABLET | Freq: Three times a day (TID) | ORAL | 0 refills | Status: DC | PRN

## 2019-03-23 NOTE — Patient Instructions (Signed)
.  KEEP UP WITH YOUR VERTIGO EXERCISES DAILY!!!

## 2019-03-23 NOTE — Progress Notes (Signed)
Subjective:    Patient ID: Alexandria Mclaughlin, female    DOB: [birth year], 79 y.o.   MRN: [id number]  HPI    Ky is here in follow up of her chronic pain and gait disorder. She had a huge improvement in her balance and vestibular deficits with outpt therapies. She is doing her home exercise program daily. Her balance is essentially back to normal. She is not using a cane or a walker!!  Her right shoulder responded to there medrol dose pack.   Otherwise she feels good. Pain levels are minimal. She has some stress related to her significant other and his PD. She tries to let him care for his own issues when he can. She remains on percocet and fentanyl for pain control with gabapentin for neuropathic pain.      Pain Inventory Average Pain 4 Pain Right Now 4 My pain is intermittent, sharp and stabbing  In the last 24 hours, has pain interfered with the following? General activity 5 Relation with others 6 Enjoyment of life 7 What TIME of day is your pain at its worst? daytime Sleep (in general) Fair  Pain is worse with: sitting and standing Pain improves with: heat/ice and medication Relief from Meds: 5  Mobility Do you have any goals in this area?  no  Function Do you have any goals in this area?  no  Neuro/Psych bladder control problems numbness depression  Prior Studies Any changes since last visit?  no  Physicians involved in your care Any changes since last visit?  no   Family History  Problem Relation Age of Onset  . Diabetes Mother   . Heart disease Mother   . Stroke Mother   . Heart disease Father   . Heart disease Brother    Social History   Socioeconomic History  . Marital status: Widowed    Spouse name: Not on file  . Number of children: Not on file  . Years of education: Not on file  . Highest education level: Not on file  Occupational History  . Not on file  Tobacco Use  . Smoking status: Former Smoker    Packs/day: 0.50    Years: 5.00    Pack  years: 2.50    Types: Cigarettes    Quit date: 03/15/1968    Years since quitting: 51.0  . Smokeless tobacco: Never Used  Substance and Sexual Activity  . Alcohol use: Not Currently    Alcohol/week: 1.0 standard drinks    Types: 1 Glasses of wine per week    Comment: occasional glass of wine  . Drug use: No  . Sexual activity: Not on file  Other Topics Concern  . Not on file  Social History Narrative  . Not on file   Social Determinants of Health   Financial Resource Strain:   . Difficulty of Paying Living Expenses: Not on file  Food Insecurity:   . Worried About Charity fundraiser in the Last Year: Not on file  . Ran Out of Food in the Last Year: Not on file  Transportation Needs:   . Lack of Transportation (Medical): Not on file  . Lack of Transportation (Non-Medical): Not on file  Physical Activity:   . Days of Exercise per Week: Not on file  . Minutes of Exercise per Session: Not on file  Stress:   . Feeling of Stress : Not on file  Social Connections:   . Frequency of Communication with Friends and Family:  Not on file  . Frequency of Social Gatherings with Friends and Family: Not on file  . Attends Religious Services: Not on file  . Active Member of Clubs or Organizations: Not on file  . Attends Archivist Meetings: Not on file  . Marital Status: Not on file   Past Surgical History:  Procedure Laterality Date  . ABDOMINAL HYSTERECTOMY    . BACK SURGERY     2011  . BLADDER SURGERY  1985  . CESAREAN SECTION     x2  . RETINAL DETACHMENT SURGERY  9/13  . TRANSFORAMINAL LUMBAR INTERBODY FUSION W/ MIS 1 LEVEL Right 04/26/2018   Procedure: Right Lumbar four-five Minimally invasive Transforaminal lumbar interbody fusion;  Surgeon: Judith Part, MD;  Location: [facility];  Service: Neurosurgery;  Laterality: Right;   Past Medical History:  Diagnosis Date  . Cervical facet syndrome   . Depression   . Disorders of sacrum   . Enthesopathy of hip region   .  GERD (gastroesophageal reflux disease)   . Headache   . History of kidney stones   . Hypertension   . Sciatic neuritis   . Synovitis and tenosynovitis   . Vision abnormalities    BP 128/69   Pulse (!) 102   Temp 98 F (36.7 C)   Ht 5\' 3"  (1.6 m)   Wt 136 lb (61.7 kg)   SpO2 95%   BMI 24.09 kg/m   Opioid Risk Score:   Fall Risk Score:  `1  Depression screen PHQ 2/9  Depression screen [facility] 2/9 01/25/2018 10/12/2017 06/03/2017 04/08/2017 10/22/2015 05/23/2015 03/20/2015  Decreased Interest 0 0 0 0 0 0 0  Down, Depressed, Hopeless 0 0 0 0 0 0 0  PHQ - 2 Score 0 0 0 0 0 0 0  Altered sleeping - - 0 - - 0 -  Tired, decreased energy - - 0 - - 3 -  Change in appetite - - 0 - - 3 -  Feeling bad or failure about yourself  - - 0 - - 0 -  Trouble concentrating - - 0 - - 0 -  Moving slowly or fidgety/restless - - 0 - - 0 -  Suicidal thoughts - - 0 - - 0 -  PHQ-9 Score - - 0 - - 6 -  Some recent data might be hidden     Review of Systems  Constitutional: Negative.   HENT: Negative.   Eyes: Negative.   Respiratory: Negative.   Cardiovascular: Negative.   Gastrointestinal: Negative.   Endocrine: Negative.   Genitourinary: Positive for difficulty urinating.  Musculoskeletal: Positive for arthralgias and back pain.  Skin: Negative.   Allergic/Immunologic: Negative.   Neurological: Positive for numbness.  Hematological: Negative.   Psychiatric/Behavioral: Positive for dysphoric mood.  All other systems reviewed and are negative.      Objective:   Physical Exam  General: No acute distress HEENT: EOMI, oral membranes moist Cards: reg rate  Chest: normal effort Abdomen: Soft, NT, ND Skin: dry, intact Extremities: no edema Neurological:Patient is alert and oriented x3 with normal insight and awareness.Speech is clear. No language issues. no nystagmus.  Balance is now baseline but she still struggles with tandem gait. Strength 5/5. Sensation nl except for RLE.        1.Chronic low back pain with lumbar postlaminectomy syndrome. Recent fall with new L1 compression fracture. -has had good results previously with L4-5 fusion/decompression/scar tissue removal by NS -refilled fentanyl patch30mcgevery 72 hours, #10.   -  refilledPercocet 10/325 #75.  We will continue the controlled substance monitoring program, this consists of regular clinic visits, examinations, routine drug screening, pill counts as well as use of [place] Controlled Substance Reporting System. [place] was [place] today.   Medication was refilled and a second prescription was sent to the patient's pharmacy for next month.  2. Neuropathic Pain/right sided lumbar radicular pain, L4---improved after surgery -some residual numbness in therightlegwhich waxes and wanes --orthotics, appropriate shoe wear due to foot drop, EHL weakness have been discussed.  3. Gait Disorder:d/t BPPV  -MUCH improved  -continue vestibular exercises HEP 4. Myoclonus with sleep: Continue Klonopin 5.Lumbar facet arthropathy:had previous results with MBB's. 6.Osteoarthritis of hands. Voltaren gel 3 times daily.  7. Right shoulderandleft shoulder pain, left biceps tendonitis  -pain reduced after medrol dose pack -continue HEP      -may need further intervention in future.   Fifteen minutes of face to face patient care time were spent during this visit. All questions were encouraged and answered.  Follow up with NP in 2 mos .

## 2019-04-16 ENCOUNTER — Other Ambulatory Visit: Payer: Self-pay | Admitting: Physical Medicine & Rehabilitation

## 2019-04-16 DIAGNOSIS — G253 Myoclonus: Secondary | ICD-10-CM

## 2019-04-18 DIAGNOSIS — M810 Age-related osteoporosis without current pathological fracture: Secondary | ICD-10-CM | POA: Diagnosis not present

## 2019-04-19 ENCOUNTER — Encounter: Payer: Self-pay | Admitting: Physical Therapy

## 2019-04-19 NOTE — Therapy (Signed)
[facility] [address] [place], [place], [address] Phone: [phone]   Fax:  [phone]  Patient Details  Name: Alexandria Mclaughlin MRN: [id number] Date of Birth: 04-29-1940 Referring Provider:  No ref. provider found  Encounter Date: 04/19/2019  PHYSICAL THERAPY DISCHARGE SUMMARY  Visits from Start of Care: 8  Current functional level related to goals / functional outcomes: Did not perform formal LTG assessment as pt did not return to therapy after 02/23/2019.  Spoke with patient over the phone and patient stated that her dizziness has fully resolved and balance has returned to baseline.  Pt reports she is no longer using an AD.  Pt did not feel she needed to continue with therapy at this time.   Remaining deficits: None reported   Education / Equipment: HEP  Plan: Patient agrees to discharge.  Patient goals were met. Patient is being discharged due to being pleased with the current functional level.  ?????    Rico Junker, PT, DPT 04/19/19    5:24 PM  [facility] [address] [place], [place], [address] Phone: [phone]   Fax:  [phone]

## 2019-05-20 ENCOUNTER — Encounter: Payer: Medicare Other | Attending: Physical Medicine and Rehabilitation | Admitting: Registered Nurse

## 2019-05-20 ENCOUNTER — Other Ambulatory Visit: Payer: Self-pay

## 2019-05-20 VITALS — BP 118/82 | HR 74 | Temp 97.5°F | Wt 141.0 lb

## 2019-05-20 DIAGNOSIS — G894 Chronic pain syndrome: Secondary | ICD-10-CM

## 2019-05-20 DIAGNOSIS — Z79899 Other long term (current) drug therapy: Secondary | ICD-10-CM | POA: Diagnosis not present

## 2019-05-20 DIAGNOSIS — M5416 Radiculopathy, lumbar region: Secondary | ICD-10-CM

## 2019-05-20 DIAGNOSIS — M2170 Unequal limb length (acquired), unspecified site: Secondary | ICD-10-CM | POA: Insufficient documentation

## 2019-05-20 DIAGNOSIS — M47816 Spondylosis without myelopathy or radiculopathy, lumbar region: Secondary | ICD-10-CM

## 2019-05-20 DIAGNOSIS — M7522 Bicipital tendinitis, left shoulder: Secondary | ICD-10-CM

## 2019-05-20 DIAGNOSIS — M533 Sacrococcygeal disorders, not elsewhere classified: Secondary | ICD-10-CM | POA: Diagnosis not present

## 2019-05-20 DIAGNOSIS — Z5181 Encounter for therapeutic drug level monitoring: Secondary | ICD-10-CM | POA: Diagnosis not present

## 2019-05-20 DIAGNOSIS — M961 Postlaminectomy syndrome, not elsewhere classified: Secondary | ICD-10-CM | POA: Diagnosis not present

## 2019-05-20 DIAGNOSIS — G253 Myoclonus: Secondary | ICD-10-CM

## 2019-05-20 DIAGNOSIS — Z79891 Long term (current) use of opiate analgesic: Secondary | ICD-10-CM

## 2019-05-20 MED ORDER — CLONAZEPAM 0.5 MG PO TABS: ORAL_TABLET | ORAL | 1 refills | Status: DC

## 2019-05-20 MED ORDER — OXYCODONE-ACETAMINOPHEN 10-325 MG PO TABS: 1.0000 | ORAL_TABLET | Freq: Three times a day (TID) | ORAL | 0 refills | Status: DC | PRN

## 2019-05-20 MED ORDER — FENTANYL 25 MCG/HR TD PT72: 1.0000 | MEDICATED_PATCH | TRANSDERMAL | 0 refills | Status: DC

## 2019-05-20 NOTE — Progress Notes (Signed)
Subjective:    Patient ID: Alexandria Mclaughlin, female    DOB: 04/12/1940, 79 y.o.   MRN: [id number]  HPI: Alexandria Mclaughlin is a 79 y.o. female who returns for follow up appointment for chronic pain and medication refill. She states her pain is located in her lower back radiating into her right buttock. She rates her pain 4. Her current exercise regime is walking and performing stretching exercises.  [name] Morphine equivalent is 105.00 MME. She is also prescribed Clonazepam We have discussed the black box warning of using opioids and benzodiazepines. I highlighted the dangers of using these drugs together and discussed the adverse events including respiratory suppression, overdose, cognitive impairment and importance of compliance with current regimen. We will continue to monitor and adjust as indicated.   Last Oral Swab was Performed on 12/22/2018, it was consistent. Oral Swab Performed today.    Pain Inventory Average Pain 4 Pain Right Now 4 My pain is intermittent, sharp and stabbing  In the last 24 hours, has pain interfered with the following? General activity 5 Relation with others 5 Enjoyment of life 5 What TIME of day is your pain at its worst? daytime Sleep (in general) Good  Pain is worse with: walking, sitting and standing Pain improves with: heat/ice and medication Relief from Meds: 7  Mobility Do you have any goals in this area?  no  Function Do you have any goals in this area?  no  Neuro/Psych No problems in this area  Prior Studies Any changes since last visit?  no  Physicians involved in your care Any changes since last visit?  no   Family History  Problem Relation Age of Onset  . Diabetes Mother   . Heart disease Mother   . Stroke Mother   . Heart disease Father   . Heart disease Brother    Social History   Socioeconomic History  . Marital status: Widowed    Spouse name: Not on file  . Number of children: Not on file  . Years of education: Not on file    . Highest education level: Not on file  Occupational History  . Not on file  Tobacco Use  . Smoking status: Former Smoker    Packs/day: 0.50    Years: 5.00    Pack years: 2.50    Types: Cigarettes    Quit date: 03/15/1968    Years since quitting: 51.2  . Smokeless tobacco: Never Used  Substance and Sexual Activity  . Alcohol use: Not Currently    Alcohol/week: 1.0 standard drinks    Types: 1 Glasses of wine per week    Comment: occasional glass of wine  . Drug use: No  . Sexual activity: Not on file  Other Topics Concern  . Not on file  Social History Narrative  . Not on file   Social Determinants of Health   Financial Resource Strain:   . Difficulty of Paying Living Expenses:   Food Insecurity:   . Worried About Charity fundraiser in the Last Year:   . Arboriculturist in the Last Year:   Transportation Needs:   . Film/video editor (Medical):   Marland Kitchen Lack of Transportation (Non-Medical):   Physical Activity:   . Days of Exercise per Week:   . Minutes of Exercise per Session:   Stress:   . Feeling of Stress :   Social Connections:   . Frequency of Communication with Friends and Family:   .  Frequency of Social Gatherings with Friends and Family:   . Attends Religious Services:   . Active Member of Clubs or Organizations:   . Attends Archivist Meetings:   Marland Kitchen Marital Status:    Past Surgical History:  Procedure Laterality Date  . ABDOMINAL HYSTERECTOMY    . BACK SURGERY     2011  . BLADDER SURGERY  1985  . CESAREAN SECTION     x2  . RETINAL DETACHMENT SURGERY  9/13  . TRANSFORAMINAL LUMBAR INTERBODY FUSION W/ MIS 1 LEVEL Right 04/26/2018   Procedure: Right Lumbar four-five Minimally invasive Transforaminal lumbar interbody fusion;  Surgeon: Judith Part, MD;  Location: [facility];  Service: Neurosurgery;  Laterality: Right;   Past Medical History:  Diagnosis Date  . Cervical facet syndrome   . Depression   . Disorders of sacrum   . Enthesopathy  of hip region   . GERD (gastroesophageal reflux disease)   . Headache   . History of kidney stones   . Hypertension   . Sciatic neuritis   . Synovitis and tenosynovitis   . Vision abnormalities    BP 118/82   Pulse 74   Temp (!) 97.5 F (36.4 C)   Wt 141 lb (64 kg)   SpO2 91%   BMI 24.98 kg/m   Opioid Risk Score:   Fall Risk Score:  `1  Depression screen PHQ 2/9  Depression screen [facility] 2/9 01/25/2018 10/12/2017 06/03/2017 04/08/2017 10/22/2015 05/23/2015 03/20/2015  Decreased Interest 0 0 0 0 0 0 0  Down, Depressed, Hopeless 0 0 0 0 0 0 0  PHQ - 2 Score 0 0 0 0 0 0 0  Altered sleeping - - 0 - - 0 -  Tired, decreased energy - - 0 - - 3 -  Change in appetite - - 0 - - 3 -  Feeling bad or failure about yourself  - - 0 - - 0 -  Trouble concentrating - - 0 - - 0 -  Moving slowly or fidgety/restless - - 0 - - 0 -  Suicidal thoughts - - 0 - - 0 -  PHQ-9 Score - - 0 - - 6 -  Some recent data might be hidden   Review of Systems  All other systems reviewed and are negative.      Objective:   Physical Exam Vitals and nursing note reviewed.  Constitutional:      Appearance: Normal appearance.  Cardiovascular:     Rate and Rhythm: Normal rate and regular rhythm.     Pulses: Normal pulses.     Heart sounds: Normal heart sounds.  Pulmonary:     Effort: Pulmonary effort is normal.     Breath sounds: Normal breath sounds.  Musculoskeletal:     Cervical back: Normal range of motion and neck supple.     Comments: Normal Muscle Bulk and Muscle Testing Reveals:  Upper Extremities: Full ROM and Muscle Strength 5/5  Lumbar Paraspinal Tenderness: L-4-L-5 Lower Extremities: Full ROM and Muscle Strength 5/5 Arises from Table with ease Narrow Based  Gait   Skin:    General: Skin is warm and dry.  Neurological:     Mental Status: She is alert and oriented to person, place, and time.  Psychiatric:        Mood and Affect: Mood normal.        Behavior: Behavior normal.             Assessment & Plan:  Facet  Arthropathy/ Sacroiliac Joint Dysfunction: 05/20/2018 Refilled:Fentanyl 25 mcg patch # 10- one patch every 3 days and   Percocet 10/325 mg one every 8 hours as needed  # 90. Second script of given for the following month. We will continue the opioid monitoring program, this consists of regular clinic visits, examinations, urine drug screen, pill counts as well as use of [place] Controlled Substance Reporting System. 2. Lumbar Radicular  Pain: Continue Gabapentin and Cymbalta. 05/23/2019 3. Gait Disorder: Continue HEP /Neurology Following. 05/23/2019 4. Myoclonus with sleep: Continue Klonopin. 05/23/2019. Continue current medication regimen. Continue to Monitor.   15  minutes of face to face patient care time was spent during this visit. All questions were encouraged and answered.   F/U in 35months

## 2019-05-23 ENCOUNTER — Encounter: Payer: Self-pay | Admitting: Registered Nurse

## 2019-05-23 LAB — DRUG TOX MONITOR 1 W/CONF, ORAL FLD
Amphetamines: NEGATIVE ng/mL (ref <10)
Barbiturates: NEGATIVE ng/mL (ref <10)
Benzodiazepines: NEGATIVE ng/mL (ref <0.50)
Buprenorphine: NEGATIVE ng/mL (ref <0.10)
Cocaine: NEGATIVE ng/mL (ref <5.0)
Codeine: NEGATIVE ng/mL (ref <2.5)
Dihydrocodeine: NEGATIVE ng/mL (ref <2.5)
Fentanyl: 0.55 ng/mL — ABNORMAL HIGH (ref <0.10)
Fentanyl: POSITIVE ng/mL — AB (ref <0.10)
Heroin Metabolite: NEGATIVE ng/mL (ref <1.0)
Hydrocodone: NEGATIVE ng/mL (ref <2.5)
Hydromorphone: NEGATIVE ng/mL (ref <2.5)
MARIJUANA: NEGATIVE ng/mL (ref <2.5)
MDMA: NEGATIVE ng/mL (ref <10)
Meprobamate: NEGATIVE ng/mL (ref <2.5)
Methadone: NEGATIVE ng/mL (ref <5.0)
Morphine: NEGATIVE ng/mL (ref <2.5)
Nicotine Metabolite: NEGATIVE ng/mL (ref <5.0)
Norhydrocodone: NEGATIVE ng/mL (ref <2.5)
Noroxycodone: 8.4 ng/mL — ABNORMAL HIGH (ref <2.5)
Opiates: POSITIVE ng/mL — AB (ref <2.5)
Oxycodone: 29.9 ng/mL — ABNORMAL HIGH (ref <2.5)
Oxymorphone: NEGATIVE ng/mL (ref <2.5)
Phencyclidine: NEGATIVE ng/mL (ref <10)
Tapentadol: NEGATIVE ng/mL (ref <5.0)
Tramadol: NEGATIVE ng/mL (ref <5.0)
Zolpidem: NEGATIVE ng/mL (ref <5.0)

## 2019-05-23 LAB — DRUG TOX ALC METAB W/CON, ORAL FLD: Alcohol Metabolite: NEGATIVE ng/mL (ref <25)

## 2019-05-27 ENCOUNTER — Other Ambulatory Visit: Payer: Self-pay | Admitting: Physical Medicine & Rehabilitation

## 2019-05-31 ENCOUNTER — Telehealth: Payer: Self-pay

## 2019-05-31 NOTE — Telephone Encounter (Signed)
UDS RESULTS CONSISTENT WITH MEDICATIONS ON FILE  

## 2019-06-21 ENCOUNTER — Other Ambulatory Visit: Payer: Self-pay | Admitting: Physical Medicine & Rehabilitation

## 2019-06-30 ENCOUNTER — Other Ambulatory Visit: Payer: Self-pay | Admitting: Internal Medicine

## 2019-06-30 DIAGNOSIS — E041 Nontoxic single thyroid nodule: Secondary | ICD-10-CM

## 2019-07-01 DIAGNOSIS — Z961 Presence of intraocular lens: Secondary | ICD-10-CM | POA: Diagnosis not present

## 2019-07-01 DIAGNOSIS — H40023 Open angle with borderline findings, high risk, bilateral: Secondary | ICD-10-CM | POA: Diagnosis not present

## 2019-07-01 DIAGNOSIS — H532 Diplopia: Secondary | ICD-10-CM | POA: Diagnosis not present

## 2019-07-01 DIAGNOSIS — H43813 Vitreous degeneration, bilateral: Secondary | ICD-10-CM | POA: Diagnosis not present

## 2019-07-01 DIAGNOSIS — H5213 Myopia, bilateral: Secondary | ICD-10-CM | POA: Diagnosis not present

## 2019-07-01 DIAGNOSIS — H35373 Puckering of macula, bilateral: Secondary | ICD-10-CM | POA: Diagnosis not present

## 2019-07-01 DIAGNOSIS — H5022 Vertical strabismus, left eye: Secondary | ICD-10-CM | POA: Diagnosis not present

## 2019-07-14 ENCOUNTER — Encounter: Payer: Medicare Other | Attending: Physical Medicine and Rehabilitation | Admitting: Registered Nurse

## 2019-07-14 ENCOUNTER — Other Ambulatory Visit: Payer: Self-pay

## 2019-07-14 VITALS — BP 109/73 | HR 81 | Temp 98.2°F | Ht 63.0 in | Wt 142.4 lb

## 2019-07-14 DIAGNOSIS — M961 Postlaminectomy syndrome, not elsewhere classified: Secondary | ICD-10-CM | POA: Diagnosis not present

## 2019-07-14 DIAGNOSIS — M25511 Pain in right shoulder: Secondary | ICD-10-CM

## 2019-07-14 DIAGNOSIS — M545 Low back pain, unspecified: Secondary | ICD-10-CM

## 2019-07-14 DIAGNOSIS — Z5181 Encounter for therapeutic drug level monitoring: Secondary | ICD-10-CM | POA: Insufficient documentation

## 2019-07-14 DIAGNOSIS — M7522 Bicipital tendinitis, left shoulder: Secondary | ICD-10-CM | POA: Diagnosis not present

## 2019-07-14 DIAGNOSIS — G894 Chronic pain syndrome: Secondary | ICD-10-CM | POA: Diagnosis not present

## 2019-07-14 DIAGNOSIS — I10 Essential (primary) hypertension: Secondary | ICD-10-CM | POA: Diagnosis not present

## 2019-07-14 DIAGNOSIS — E782 Mixed hyperlipidemia: Secondary | ICD-10-CM | POA: Diagnosis not present

## 2019-07-14 DIAGNOSIS — R7303 Prediabetes: Secondary | ICD-10-CM | POA: Diagnosis not present

## 2019-07-14 DIAGNOSIS — M2170 Unequal limb length (acquired), unspecified site: Secondary | ICD-10-CM | POA: Insufficient documentation

## 2019-07-14 DIAGNOSIS — M5416 Radiculopathy, lumbar region: Secondary | ICD-10-CM

## 2019-07-14 DIAGNOSIS — G253 Myoclonus: Secondary | ICD-10-CM | POA: Diagnosis not present

## 2019-07-14 DIAGNOSIS — M25471 Effusion, right ankle: Secondary | ICD-10-CM

## 2019-07-14 DIAGNOSIS — N390 Urinary tract infection, site not specified: Secondary | ICD-10-CM | POA: Diagnosis not present

## 2019-07-14 DIAGNOSIS — I700 Atherosclerosis of aorta: Secondary | ICD-10-CM | POA: Diagnosis not present

## 2019-07-14 DIAGNOSIS — Z79891 Long term (current) use of opiate analgesic: Secondary | ICD-10-CM

## 2019-07-14 DIAGNOSIS — Z23 Encounter for immunization: Secondary | ICD-10-CM | POA: Diagnosis not present

## 2019-07-14 DIAGNOSIS — G8929 Other chronic pain: Secondary | ICD-10-CM | POA: Diagnosis not present

## 2019-07-14 DIAGNOSIS — M75101 Unspecified rotator cuff tear or rupture of right shoulder, not specified as traumatic: Secondary | ICD-10-CM

## 2019-07-14 DIAGNOSIS — M47816 Spondylosis without myelopathy or radiculopathy, lumbar region: Secondary | ICD-10-CM | POA: Diagnosis not present

## 2019-07-14 DIAGNOSIS — M533 Sacrococcygeal disorders, not elsewhere classified: Secondary | ICD-10-CM | POA: Diagnosis not present

## 2019-07-14 MED ORDER — OXYCODONE-ACETAMINOPHEN 10-325 MG PO TABS: 1.0000 | ORAL_TABLET | Freq: Three times a day (TID) | ORAL | 0 refills | Status: DC | PRN

## 2019-07-14 MED ORDER — CLONAZEPAM 0.5 MG PO TABS: ORAL_TABLET | ORAL | 1 refills | Status: DC

## 2019-07-14 MED ORDER — FENTANYL 25 MCG/HR TD PT72: 1.0000 | MEDICATED_PATCH | TRANSDERMAL | 0 refills | Status: DC

## 2019-07-14 NOTE — Progress Notes (Signed)
Subjective:    Patient ID: Alexandria Mclaughlin, female    DOB: 05-Nov-1940, 79 y.o.   MRN: [id number]  HPI: Alexandria Mclaughlin is a 79 y.o. female who returns for follow up appointment for chronic pain and medication refill. She states her pain is located in her right shoulder, lower back pain radiating into her right hip and right ankle swelling with a feeling of tightness ( she denies falling). Also reports her lower back pain has increased in intensity over the last three months, she denies falling, also reports she's only able to walk short distances. We discussed physical therapy, she states she's not able to attend therapy at this time. We will prescribe medrol dose pak and obtain X-rays on her lower back and right ankle, she verbalizes understanding. She was also instructed to use her cane at all times, she verbalizes understanding.   She rates her pain 4. Her current exercise regime is walking short distances and performing stretching exercises.  Alexandria Mclaughlin Morphine equivalent is 105.00 MME.  Last Oral Swab was Performed on 05/20/2019, it was consistent.   Pain Inventory Average Pain 4 Pain Right Now 4 My pain is constant, sharp and stabbing  In the last 24 hours, has pain interfered with the following? General activity 5 Relation with others 5 Enjoyment of life 7 What TIME of day is your pain at its worst? daytime Sleep (in general) Good  Pain is worse with: walking, bending, sitting and standing Pain improves with: rest, heat/ice and medication Relief from Meds: 4  Mobility Do you have any goals in this area?  no  Function Do you have any goals in this area?  no  Neuro/Psych numbness  Prior Studies Any changes since last visit?  no  Physicians involved in your care Any changes since last visit?  no   Family History  Problem Relation Age of Onset  . Diabetes Mother   . Heart disease Mother   . Stroke Mother   . Heart disease Father   . Heart disease Brother    Social History     Socioeconomic History  . Marital status: Widowed    Spouse name: Not on file  . Number of children: Not on file  . Years of education: Not on file  . Highest education level: Not on file  Occupational History  . Not on file  Tobacco Use  . Smoking status: Former Smoker    Packs/day: 0.50    Years: 5.00    Pack years: 2.50    Types: Cigarettes    Quit date: 03/15/1968    Years since quitting: 51.3  . Smokeless tobacco: Never Used  Substance and Sexual Activity  . Alcohol use: Not Currently    Alcohol/week: 1.0 standard drinks    Types: 1 Glasses of wine per week    Comment: occasional glass of wine  . Drug use: No  . Sexual activity: Not on file  Other Topics Concern  . Not on file  Social History Narrative  . Not on file   Social Determinants of Health   Financial Resource Strain:   . Difficulty of Paying Living Expenses:   Food Insecurity:   . Worried About Charity fundraiser in the Last Year:   . Arboriculturist in the Last Year:   Transportation Needs:   . Film/video editor (Medical):   Marland Kitchen Lack of Transportation (Non-Medical):   Physical Activity:   . Days of Exercise per Week:   .  Minutes of Exercise per Session:   Stress:   . Feeling of Stress :   Social Connections:   . Frequency of Communication with Friends and Family:   . Frequency of Social Gatherings with Friends and Family:   . Attends Religious Services:   . Active Member of Clubs or Organizations:   . Attends Archivist Meetings:   Marland Kitchen Marital Status:    Past Surgical History:  Procedure Laterality Date  . ABDOMINAL HYSTERECTOMY    . BACK SURGERY     2011  . BLADDER SURGERY  1985  . CESAREAN SECTION     x2  . RETINAL DETACHMENT SURGERY  9/13  . TRANSFORAMINAL LUMBAR INTERBODY FUSION W/ MIS 1 LEVEL Right 04/26/2018   Procedure: Right Lumbar four-five Minimally invasive Transforaminal lumbar interbody fusion;  Surgeon: Judith Part, MD;  Location: [facility];  Service:  Neurosurgery;  Laterality: Right;   Past Medical History:  Diagnosis Date  . Cervical facet syndrome   . Depression   . Disorders of sacrum   . Enthesopathy of hip region   . GERD (gastroesophageal reflux disease)   . Headache   . History of kidney stones   . Hypertension   . Sciatic neuritis   . Synovitis and tenosynovitis   . Vision abnormalities    BP 109/73   Pulse 81   Temp 98.2 F (36.8 C)   Ht 5\' 3"  (1.6 m)   Wt 142 lb 6.4 oz (64.6 kg)   SpO2 93%   BMI 25.23 kg/m   Opioid Risk Score:   Fall Risk Score:  `1  Depression screen PHQ 2/9  Depression screen [facility] 2/9 01/25/2018 10/12/2017 06/03/2017 04/08/2017 10/22/2015 05/23/2015 03/20/2015  Decreased Interest 0 0 0 0 0 0 0  Down, Depressed, Hopeless 0 0 0 0 0 0 0  PHQ - 2 Score 0 0 0 0 0 0 0  Altered sleeping - - 0 - - 0 -  Tired, decreased energy - - 0 - - 3 -  Change in appetite - - 0 - - 3 -  Feeling bad or failure about yourself  - - 0 - - 0 -  Trouble concentrating - - 0 - - 0 -  Moving slowly or fidgety/restless - - 0 - - 0 -  Suicidal thoughts - - 0 - - 0 -  PHQ-9 Score - - 0 - - 6 -  Some recent data might be hidden    Review of Systems  Neurological: Positive for numbness.  All other systems reviewed and are negative.      Objective:   Physical Exam Vitals and nursing note reviewed.  Constitutional:      Appearance: Normal appearance.  Cardiovascular:     Rate and Rhythm: Normal rate and regular rhythm.     Pulses: Normal pulses.     Heart sounds: Normal heart sounds.  Pulmonary:     Effort: Pulmonary effort is normal.     Breath sounds: Normal breath sounds.  Musculoskeletal:     Cervical back: Normal range of motion and neck supple.     Comments: Normal Muscle Bulk and Muscle Testing Reveals:  Upper Extremities: Right: Decreased ROM 45 Degrees  and Muscle Strength 5/5 Left: Decreased ROM 90 Degrees and Muscle Strength 5/5 Lumbar Hypersensitivity Right Greater Trochanteric Tenderness Lower  Extremities: Full ROM and Muscle Strength 5/5 Arises from Table Slowly Narrow Based  Gait   Skin:    General: Skin is warm and  dry.  Neurological:     Mental Status: She is alert and oriented to person, place, and time.  Psychiatric:        Mood and Affect: Mood normal.        Behavior: Behavior normal.           Assessment & Plan:  1. Acte exacerbation of chronic Low Back Pain: RX: Lumbar X-Boan and Medrol Dose Pak.  2. Facet Arthropathy/ Sacroiliac Joint Dysfunction:07/14/2019 Refilled:Fentanyl 25 mcg patch # 10- one patch every 3 days andPercocet 10/325 mgone every 8hours as needed # 90. Second script of given for the following month. We will continue the opioid monitoring program, this consists of regular clinic visits, examinations, urine drug screen, pill counts as well as use of [place] Controlled Substance Reporting System. 3. Lumbar Radicular Pain: Continue Gabapentin and Cymbalta. 07/14/2019 4. Gait Disorder: Continue HEP /Neurology Following.07/14/2019 5. Myoclonus with sleep: Continue Klonopin.07/14/2019. Continue current medication regimen. Continue to Monitor.  6. Right Ankle Swelling: RX: Xray. Encouraged to use Ice Therapy. Continue to Monitor.   15  minutes of face to face patient care time was spent during this visit. All questions were encouraged and answered.   F/U in 59months

## 2019-07-15 ENCOUNTER — Encounter: Payer: Self-pay | Admitting: Registered Nurse

## 2019-07-21 DIAGNOSIS — I10 Essential (primary) hypertension: Secondary | ICD-10-CM | POA: Diagnosis not present

## 2019-07-21 DIAGNOSIS — R002 Palpitations: Secondary | ICD-10-CM | POA: Diagnosis not present

## 2019-07-21 DIAGNOSIS — Z0000 Encounter for general adult medical examination without abnormal findings: Secondary | ICD-10-CM | POA: Diagnosis not present

## 2019-07-21 DIAGNOSIS — E782 Mixed hyperlipidemia: Secondary | ICD-10-CM | POA: Diagnosis not present

## 2019-07-27 ENCOUNTER — Ambulatory Visit
Admission: RE | Admit: 2019-07-27 | Discharge: 2019-07-27 | Disposition: A | Discharge status: Home / Self Care | Payer: Medicare Other | Source: Ambulatory Visit | Attending: Registered Nurse | Admitting: Registered Nurse

## 2019-07-27 ENCOUNTER — Other Ambulatory Visit: Payer: Self-pay

## 2019-07-27 ENCOUNTER — Other Ambulatory Visit: Payer: Self-pay | Admitting: Physical Medicine & Rehabilitation

## 2019-07-27 DIAGNOSIS — M25571 Pain in right ankle and joints of right foot: Secondary | ICD-10-CM | POA: Diagnosis not present

## 2019-07-27 DIAGNOSIS — M5416 Radiculopathy, lumbar region: Secondary | ICD-10-CM

## 2019-07-27 DIAGNOSIS — M961 Postlaminectomy syndrome, not elsewhere classified: Secondary | ICD-10-CM

## 2019-07-27 DIAGNOSIS — M545 Low back pain: Secondary | ICD-10-CM | POA: Diagnosis not present

## 2019-07-27 DIAGNOSIS — M7989 Other specified soft tissue disorders: Secondary | ICD-10-CM | POA: Diagnosis not present

## 2019-08-04 DIAGNOSIS — S81802A Unspecified open wound, left lower leg, initial encounter: Secondary | ICD-10-CM | POA: Diagnosis not present

## 2019-08-04 DIAGNOSIS — M79605 Pain in left leg: Secondary | ICD-10-CM | POA: Diagnosis not present

## 2019-08-08 ENCOUNTER — Ambulatory Visit
Admission: RE | Admit: 2019-08-08 | Discharge: 2019-08-08 | Disposition: A | Discharge status: Home / Self Care | Payer: Medicare Other | Source: Ambulatory Visit | Attending: Internal Medicine | Admitting: Internal Medicine

## 2019-08-08 DIAGNOSIS — E041 Nontoxic single thyroid nodule: Secondary | ICD-10-CM

## 2019-08-08 DIAGNOSIS — E042 Nontoxic multinodular goiter: Secondary | ICD-10-CM | POA: Diagnosis not present

## 2019-08-11 ENCOUNTER — Other Ambulatory Visit: Payer: Self-pay | Admitting: Internal Medicine

## 2019-08-11 DIAGNOSIS — E041 Nontoxic single thyroid nodule: Secondary | ICD-10-CM

## 2019-08-17 ENCOUNTER — Encounter (INDEPENDENT_AMBULATORY_CARE_PROVIDER_SITE_OTHER): Payer: Self-pay | Admitting: Otolaryngology

## 2019-08-17 ENCOUNTER — Other Ambulatory Visit: Payer: Self-pay

## 2019-08-17 ENCOUNTER — Ambulatory Visit (INDEPENDENT_AMBULATORY_CARE_PROVIDER_SITE_OTHER): Payer: Medicare Other | Admitting: Otolaryngology

## 2019-08-17 VITALS — Temp 97.7°F

## 2019-08-17 DIAGNOSIS — H6122 Impacted cerumen, left ear: Secondary | ICD-10-CM | POA: Diagnosis not present

## 2019-08-17 NOTE — Progress Notes (Signed)
HPI: Alexandria Mclaughlin is a 79 y.o. female who presents for evaluation of wax buildup predominately in the left ear referred by [facility].  She is undergoing hearing test and possible hearing aids with [facility] but had left ear canal occluded with cerumen..  Past Medical History:  Diagnosis Date  . Cervical facet syndrome   . Depression   . Disorders of sacrum   . Enthesopathy of hip region   . GERD (gastroesophageal reflux disease)   . Headache   . History of kidney stones   . Hypertension   . Sciatic neuritis   . Synovitis and tenosynovitis   . Vision abnormalities    Past Surgical History:  Procedure Laterality Date  . ABDOMINAL HYSTERECTOMY    . BACK SURGERY     2011  . BLADDER SURGERY  1985  . CESAREAN SECTION     x2  . RETINAL DETACHMENT SURGERY  9/13  . TRANSFORAMINAL LUMBAR INTERBODY FUSION W/ MIS 1 LEVEL Right 04/26/2018   Procedure: Right Lumbar four-five Minimally invasive Transforaminal lumbar interbody fusion;  Surgeon: Judith Part, MD;  Location: [facility];  Service: Neurosurgery;  Laterality: Right;   Social History   Socioeconomic History  . Marital status: Widowed    Spouse name: Not on file  . Number of children: Not on file  . Years of education: Not on file  . Highest education level: Not on file  Occupational History  . Not on file  Tobacco Use  . Smoking status: Former Smoker    Packs/day: 0.50    Years: 5.00    Pack years: 2.50    Types: Cigarettes    Quit date: 03/15/1968    Years since quitting: 51.4  . Smokeless tobacco: Never Used  Vaping Use  . Vaping Use: Never used  Substance and Sexual Activity  . Alcohol use: Not Currently    Alcohol/week: 1.0 standard drink    Types: 1 Glasses of wine per week    Comment: occasional glass of wine  . Drug use: No  . Sexual activity: Not on file  Other Topics Concern  . Not on file  Social History Narrative  . Not on file   Social Determinants of Health   Financial Resource  Strain:   . Difficulty of Paying Living Expenses:   Food Insecurity:   . Worried About Charity fundraiser in the Last Year:   . Arboriculturist in the Last Year:   Transportation Needs:   . Film/video editor (Medical):   Marland Kitchen Lack of Transportation (Non-Medical):   Physical Activity:   . Days of Exercise per Week:   . Minutes of Exercise per Session:   Stress:   . Feeling of Stress :   Social Connections:   . Frequency of Communication with Friends and Family:   . Frequency of Social Gatherings with Friends and Family:   . Attends Religious Services:   . Active Member of Clubs or Organizations:   . Attends Archivist Meetings:   Marland Kitchen Marital Status:    Family History  Problem Relation Age of Onset  . Diabetes Mother   . Heart disease Mother   . Stroke Mother   . Heart disease Father   . Heart disease Brother    No Known Allergies Prior to Admission medications   Medication Sig Start Date End Date Taking? Authorizing Provider  clonazePAM (KLONOPIN) 0.5 MG tablet TAKE 1 TABLET BY MOUTH EVERY DAY AT BEDTIME  AS NEEDED 07/14/19  Yes Bayard Hugger, NP  DULoxetine (CYMBALTA) 30 MG capsule TAKE 1 CAPSULE BY MOUTH EVERY DAY 06/22/19  Yes Meredith Staggers, MD  fentaNYL (DURAGESIC) 25 MCG/HR Place 1 patch onto the skin every 3 (three) days. 07/14/19  Yes Bayard Hugger, NP  gabapentin (NEURONTIN) 600 MG tablet TAKE 1 TABLET (600 MG TOTAL) BY MOUTH 4 (FOUR) TIMES DAILY. 07/28/19  Yes Bayard Hugger, NP  Melatonin 5 MG CAPS Take 5 mg by mouth at bedtime as needed (sleep).   Yes [provider]  Multiple Vitamin (MULTIVITAMIN WITH MINERALS) TABS tablet Take 1 tablet by mouth daily.   Yes [provider]  oxyCODONE-acetaminophen (PERCOCET) 10-325 MG tablet Take 1 tablet by mouth every 8 (eight) hours as needed for pain. 07/14/19  Yes Bayard Hugger, NP  quinapril (ACCUPRIL) 20 MG tablet Take 20 mg by mouth daily.   Yes [provider]  rosuvastatin  (CRESTOR) 20 MG tablet Take 20 mg by mouth daily.   Yes [provider]  traZODone (DESYREL) 100 MG tablet TAKE 1 TABLET BY MOUTH EVERYDAY AT BEDTIME 01/10/19  Yes Meredith Staggers, MD  aspirin-acetaminophen-caffeine (EXCEDRIN MIGRAINE) [phone] MG tablet Take 1 tablet by mouth every 6 (six) hours as needed for headache.    [provider]  diclofenac sodium (VOLTAREN) 1 % GEL Apply 1 application topically 3 (three) times daily. BILATERAL HANDS 12/09/17   Meredith Staggers, MD  meclizine (ANTIVERT) 25 MG tablet Take 1 tablet (25 mg total) by mouth 3 (three) times daily as needed for dizziness. 12/07/18   Meredith Staggers, MD  methylPREDNISolone (MEDROL DOSEPAK) 4 MG TBPK tablet Take per package instructions 03/01/19   Meredith Staggers, MD  OVER THE COUNTER MEDICATION Take 10,000 mg by mouth daily. HEMP EXTRACT CAPSULE     [provider]  predniSONE (DELTASONE) 10 MG tablet Take 1 tablet (10 mg total) by mouth as directed. Take 1 tab 3x daily for 4 days then 1 tab 2x daily for 4 days then 1 tab once daily for 4 days, then 1/2 tab daily for 4 days and off. 10/20/18 10/20/19  Meredith Staggers, MD     Positive ROS: Otherwise negative  All other systems have been reviewed and were otherwise negative with the exception of those mentioned in the HPI and as above.  Physical Exam: Constitutional: Alert, well-appearing, no acute distress Ears: External ears without lesions or tenderness. Ear canals minimal earwax buildup on the right side a large amount of wax buildup in the left side that was cleaned with curette and forceps.  Ear canals and TMs otherwise clear.. Nasal: External nose without lesions. Clear nasal passages Oral: Oropharynx clear. Neck: No palpable adenopathy or masses Respiratory: Breathing comfortably  Skin: No facial/neck lesions or rash noted.  Cerumen impaction removal  Date/Time: 08/17/2019 12:05 PM Performed by: Rozetta Nunnery, MD Authorized  by: Rozetta Nunnery, MD   Consent:    Consent obtained:  Verbal   Consent given by:  Patient   Risks discussed:  Pain and bleeding Procedure details:    Location:  L ear and R ear   Procedure type: curette and forceps   Post-procedure details:    Inspection:  TM intact and canal normal   Hearing quality:  Improved   Patient tolerance of procedure:  Tolerated well, no immediate complications Comments:     Left ear canal had a large amount of wax that was cleaned.  Had minimal wax buildup on the right side.  TMs were clear bilaterally.    Assessment: Cerumen buildup left side much worse than right Sensorineural hearing loss  Plan: She will follow-up as needed. She will follow-up with [facility] concerning obtaining hearing aids.  Radene Journey, MD

## 2019-08-18 ENCOUNTER — Ambulatory Visit
Admission: RE | Admit: 2019-08-18 | Discharge: 2019-08-18 | Disposition: A | Discharge status: Home / Self Care | Payer: Medicare Other | Source: Ambulatory Visit | Attending: Internal Medicine | Admitting: Internal Medicine

## 2019-08-18 ENCOUNTER — Other Ambulatory Visit: Payer: Self-pay

## 2019-08-18 ENCOUNTER — Other Ambulatory Visit (HOSPITAL_COMMUNITY)
Admission: RE | Admit: 2019-08-18 | Discharge: 2019-08-18 | Disposition: A | Discharge status: Home / Self Care | Payer: Medicare Other | Source: Ambulatory Visit | Attending: Interventional Radiology | Admitting: Interventional Radiology

## 2019-08-18 DIAGNOSIS — E041 Nontoxic single thyroid nodule: Secondary | ICD-10-CM

## 2019-08-19 DIAGNOSIS — H43813 Vitreous degeneration, bilateral: Secondary | ICD-10-CM | POA: Diagnosis not present

## 2019-08-19 DIAGNOSIS — Z961 Presence of intraocular lens: Secondary | ICD-10-CM | POA: Diagnosis not present

## 2019-08-19 DIAGNOSIS — H532 Diplopia: Secondary | ICD-10-CM | POA: Diagnosis not present

## 2019-08-19 DIAGNOSIS — H40023 Open angle with borderline findings, high risk, bilateral: Secondary | ICD-10-CM | POA: Diagnosis not present

## 2019-08-21 LAB — CYTOLOGY - NON PAP

## 2019-08-28 ENCOUNTER — Other Ambulatory Visit: Payer: Self-pay | Admitting: Physical Medicine & Rehabilitation

## 2019-09-01 DIAGNOSIS — H905 Unspecified sensorineural hearing loss: Secondary | ICD-10-CM | POA: Diagnosis not present

## 2019-09-15 ENCOUNTER — Encounter: Payer: Self-pay | Admitting: Registered Nurse

## 2019-09-15 ENCOUNTER — Encounter: Payer: Medicare Other | Attending: Physical Medicine and Rehabilitation | Admitting: Registered Nurse

## 2019-09-15 ENCOUNTER — Ambulatory Visit: Payer: Medicare Other | Admitting: Registered Nurse

## 2019-09-15 ENCOUNTER — Other Ambulatory Visit: Payer: Self-pay

## 2019-09-15 VITALS — BP 142/76 | HR 79 | Temp 98.9°F | Ht 63.0 in | Wt 139.8 lb

## 2019-09-15 DIAGNOSIS — M7522 Bicipital tendinitis, left shoulder: Secondary | ICD-10-CM | POA: Insufficient documentation

## 2019-09-15 DIAGNOSIS — M2170 Unequal limb length (acquired), unspecified site: Secondary | ICD-10-CM | POA: Diagnosis not present

## 2019-09-15 DIAGNOSIS — M533 Sacrococcygeal disorders, not elsewhere classified: Secondary | ICD-10-CM | POA: Insufficient documentation

## 2019-09-15 DIAGNOSIS — M25471 Effusion, right ankle: Secondary | ICD-10-CM | POA: Diagnosis not present

## 2019-09-15 DIAGNOSIS — M961 Postlaminectomy syndrome, not elsewhere classified: Secondary | ICD-10-CM | POA: Insufficient documentation

## 2019-09-15 DIAGNOSIS — Z5181 Encounter for therapeutic drug level monitoring: Secondary | ICD-10-CM | POA: Diagnosis not present

## 2019-09-15 DIAGNOSIS — G8929 Other chronic pain: Secondary | ICD-10-CM | POA: Insufficient documentation

## 2019-09-15 DIAGNOSIS — Z79891 Long term (current) use of opiate analgesic: Secondary | ICD-10-CM

## 2019-09-15 DIAGNOSIS — G894 Chronic pain syndrome: Secondary | ICD-10-CM | POA: Diagnosis not present

## 2019-09-15 DIAGNOSIS — M545 Low back pain: Secondary | ICD-10-CM | POA: Diagnosis not present

## 2019-09-15 DIAGNOSIS — M47816 Spondylosis without myelopathy or radiculopathy, lumbar region: Secondary | ICD-10-CM

## 2019-09-15 DIAGNOSIS — G253 Myoclonus: Secondary | ICD-10-CM | POA: Insufficient documentation

## 2019-09-15 DIAGNOSIS — M5416 Radiculopathy, lumbar region: Secondary | ICD-10-CM | POA: Diagnosis not present

## 2019-09-15 MED ORDER — CLONAZEPAM 0.5 MG PO TABS: ORAL_TABLET | ORAL | 2 refills | Status: DC

## 2019-09-15 MED ORDER — FENTANYL 25 MCG/HR TD PT72: 1.0000 | MEDICATED_PATCH | TRANSDERMAL | 0 refills | Status: DC

## 2019-09-15 MED ORDER — OXYCODONE-ACETAMINOPHEN 10-325 MG PO TABS: 1.0000 | ORAL_TABLET | Freq: Three times a day (TID) | ORAL | 0 refills | Status: DC | PRN

## 2019-09-15 NOTE — Patient Instructions (Signed)
Keep Pain Journal: Send My Chart message  On the week of 08/10th - 08/13th   Or call [phone]

## 2019-09-15 NOTE — Progress Notes (Signed)
Subjective:    Patient ID: Alexandria Mclaughlin, female    DOB: [birth year], 79 y.o.   MRN: [id number]  HPI: Alexandria Mclaughlin is a 79 y.o. female who returns for follow up appointment for chronic pain and medication refill. She states her pain is located in her lower back, sacral tenderness and right lower extremity and right foot with tingling and numbness. Also reports she's experiencing increase intensity of pain and her current medication not controlling her pain. She was instructed to keep a pain journal and send a message via My-chrt message, she verbalizes understanding. We discussed her medication and her age regarding her pain, she verbalizes understanding. No changes to medication at this time, we will continue with current medication regimen, awaiting her pain Journal information with 2 weeks, she verbalizes understanding. She rates her pain 5. Her current exercise regime is walking and performing stretching exercises.  Alexandria Mclaughlin reports 6 weeks ago she was going up porch steps at her granddaughter house, when she lost balanced and fell forward, her family helped her up. She didn't seek medical attention, also didn't have her cane with her. Educated on falls prevention and to use her cane at all times, she verbalizes understanding.   Alexandria Mclaughlin Morphine equivalent is 105.00 MME.  She is also prescribed Clonazepam.We have discussed the black box warning of using opioids and benzodiazepines. I highlighted the dangers of using these drugs together and discussed the adverse events including respiratory suppression, overdose, cognitive impairment and importance of compliance with current regimen. We will continue to monitor and adjust as indicated.    Last Oral Swab was Performed on 05/20/2019, it was consistent.    Pain Inventory Average Pain 5 Pain Right Now 5 My pain is constant, sharp and stabbing  In the last 24 hours, has pain interfered with the following? General activity 5 Relation with others  6 Enjoyment of life 6 What TIME of day is your pain at its worst? daytime Sleep (in general) Fair  Pain is worse with: walking, sitting and standing Pain improves with: rest, heat/ice and medication Relief from Meds: 5  Mobility how many minutes can you walk? 15 mins  Function retired I need assistance with the following:  household duties Do you have any goals in this area?  yes  Neuro/Psych numbness tingling  Prior Studies Any changes since last visit?  yes x-rays back & right heel [facility].  Physicians involved in your care Any changes since last visit?  no   Family History  Problem Relation Age of Onset  . Diabetes Mother   . Heart disease Mother   . Stroke Mother   . Heart disease Father   . Heart disease Brother    Social History   Socioeconomic History  . Marital status: Widowed    Spouse name: Not on file  . Number of children: Not on file  . Years of education: Not on file  . Highest education level: Not on file  Occupational History  . Not on file  Tobacco Use  . Smoking status: Former Smoker    Packs/day: 0.50    Years: 5.00    Pack years: 2.50    Types: Cigarettes    Quit date: 03/15/1968    Years since quitting: 51.5  . Smokeless tobacco: Never Used  Vaping Use  . Vaping Use: Never used  Substance and Sexual Activity  . Alcohol use: Not Currently    Alcohol/week: 1.0 standard drink    Types: 1  Glasses of wine per week    Comment: occasional glass of wine  . Drug use: No  . Sexual activity: Not on file  Other Topics Concern  . Not on file  Social History Narrative  . Not on file   Social Determinants of Health   Financial Resource Strain:   . Difficulty of Paying Living Expenses:   Food Insecurity:   . Worried About Charity fundraiser in the Last Year:   . Arboriculturist in the Last Year:   Transportation Needs:   . Film/video editor (Medical):   Marland Kitchen Lack of Transportation (Non-Medical):   Physical Activity:   . Days of  Exercise per Week:   . Minutes of Exercise per Session:   Stress:   . Feeling of Stress :   Social Connections:   . Frequency of Communication with Friends and Family:   . Frequency of Social Gatherings with Friends and Family:   . Attends Religious Services:   . Active Member of Clubs or Organizations:   . Attends Archivist Meetings:   Marland Kitchen Marital Status:    Past Surgical History:  Procedure Laterality Date  . ABDOMINAL HYSTERECTOMY    . BACK SURGERY     2011  . BLADDER SURGERY  1985  . CESAREAN SECTION     x2  . RETINAL DETACHMENT SURGERY  9/13  . TRANSFORAMINAL LUMBAR INTERBODY FUSION W/ MIS 1 LEVEL Right 04/26/2018   Procedure: Right Lumbar four-five Minimally invasive Transforaminal lumbar interbody fusion;  Surgeon: Judith Part, MD;  Location: [facility];  Service: Neurosurgery;  Laterality: Right;   Past Medical History:  Diagnosis Date  . Cervical facet syndrome   . Depression   . Disorders of sacrum   . Enthesopathy of hip region   . GERD (gastroesophageal reflux disease)   . Headache   . History of kidney stones   . Hypertension   . Sciatic neuritis   . Synovitis and tenosynovitis   . Vision abnormalities    BP (!) 142/76   Pulse 79   Temp 98.9 F (37.2 C)   Ht 5\' 3"  (1.6 m)   Wt 139 lb 12.8 oz (63.4 kg)   SpO2 96%   BMI 24.76 kg/m   Opioid Risk Score:   Fall Risk Score:  `1  Depression screen PHQ 2/9  Depression screen [facility] 2/9 01/25/2018 10/12/2017 06/03/2017 04/08/2017 10/22/2015 05/23/2015 03/20/2015  Decreased Interest 0 0 0 0 0 0 0  Down, Depressed, Hopeless 0 0 0 0 0 0 0  PHQ - 2 Score 0 0 0 0 0 0 0  Altered sleeping - - 0 - - 0 -  Tired, decreased energy - - 0 - - 3 -  Change in appetite - - 0 - - 3 -  Feeling bad or failure about yourself  - - 0 - - 0 -  Trouble concentrating - - 0 - - 0 -  Moving slowly or fidgety/restless - - 0 - - 0 -  Suicidal thoughts - - 0 - - 0 -  PHQ-9 Score - - 0 - - 6 -  Some recent data might be hidden     Review of Systems  Constitutional: Negative.   HENT: Negative.   Eyes: Negative.   Respiratory: Negative.   Cardiovascular: Negative.   Gastrointestinal: Negative.   Endocrine: Negative.   Genitourinary: Negative.   Musculoskeletal: Negative.   Skin: Negative.   Allergic/Immunologic: Negative.   Neurological:  Positive for numbness.       Tingling  Hematological: Negative.   All other systems reviewed and are negative.      Objective:   Physical Exam Vitals and nursing note reviewed.  Constitutional:      Appearance: Normal appearance.  Cardiovascular:     Rate and Rhythm: Normal rate and regular rhythm.     Pulses: Normal pulses.     Heart sounds: Normal heart sounds.  Pulmonary:     Effort: Pulmonary effort is normal.     Breath sounds: Normal breath sounds.  Musculoskeletal:     Cervical back: Normal range of motion and neck supple.     Comments: Normal Muscle Bulk and Muscle Testing Reveals:  Upper Extremities: Full ROM and Muscle Strength 5/5 Sacral Tenderness Lower Extremities: Full ROM and Muscle Strength 5/5 Arises from Table slowly Narrow Based  Gait   Skin:    General: Skin is warm and dry.  Neurological:     Mental Status: She is alert and oriented to person, place, and time.  Psychiatric:        Mood and Affect: Mood normal.        Behavior: Behavior normal.           Assessment & Plan:  1. Facet Arthropathy/ Sacroiliac Joint Dysfunction:09/15/2018 Refilled:Fentanyl 25 mcg patch # 10- one patch every 3 days and Continue Percocet 10/325 mgone every 8hours as needed # 90,  Second script of given for the following month of her Fentanyl. Continue to Monitor. . We will continue the opioid monitoring program, this consists of regular clinic visits, examinations, urine drug screen, pill counts as well as use of [place] Controlled Substance Reporting System. 2. Lumbar Radicular Pain:Continue HEP as Tolerated. Continue to Monitor. Continue  Gabapentin and Cymbalta. 09/15/2019 3. Gait Disorder: Continue HEP /Neurology Following.09/15/2019 4. Myoclonus with sleep: Continue Klonopin.09/15/2019. Continue current medication regimen. Continue to Monitor.   20  minutes of face to face patient care time was spent during this visit. All questions were encouraged and answered.   F/U in 36months

## 2019-09-16 ENCOUNTER — Encounter: Payer: Self-pay | Admitting: Registered Nurse

## 2019-09-19 ENCOUNTER — Other Ambulatory Visit: Payer: Self-pay | Admitting: Physical Medicine & Rehabilitation

## 2019-10-04 DIAGNOSIS — L570 Actinic keratosis: Secondary | ICD-10-CM | POA: Diagnosis not present

## 2019-10-07 ENCOUNTER — Telehealth: Payer: Self-pay | Admitting: Registered Nurse

## 2019-10-07 DIAGNOSIS — M7522 Bicipital tendinitis, left shoulder: Secondary | ICD-10-CM

## 2019-10-07 MED ORDER — OXYCODONE-ACETAMINOPHEN 10-325 MG PO TABS: 1.0000 | ORAL_TABLET | Freq: Three times a day (TID) | ORAL | 0 refills | Status: DC | PRN

## 2019-10-07 NOTE — Telephone Encounter (Signed)
PMP was Reviewed: Oxycodone e-scribed. Alexandria Mclaughlin is aware of the above  vua My-Chart Message.

## 2019-10-12 DIAGNOSIS — R21 Rash and other nonspecific skin eruption: Secondary | ICD-10-CM | POA: Diagnosis not present

## 2019-10-20 DIAGNOSIS — M810 Age-related osteoporosis without current pathological fracture: Secondary | ICD-10-CM | POA: Diagnosis not present

## 2019-10-25 DIAGNOSIS — L821 Other seborrheic keratosis: Secondary | ICD-10-CM | POA: Diagnosis not present

## 2019-10-25 DIAGNOSIS — L309 Dermatitis, unspecified: Secondary | ICD-10-CM | POA: Diagnosis not present

## 2019-11-05 DIAGNOSIS — R21 Rash and other nonspecific skin eruption: Secondary | ICD-10-CM | POA: Diagnosis not present

## 2019-11-08 ENCOUNTER — Encounter: Payer: Medicare Other | Admitting: Registered Nurse

## 2019-11-09 ENCOUNTER — Encounter: Payer: Medicare Other | Attending: Physical Medicine and Rehabilitation | Admitting: Registered Nurse

## 2019-11-09 ENCOUNTER — Other Ambulatory Visit: Payer: Self-pay

## 2019-11-09 VITALS — BP 126/84 | HR 72 | Temp 98.6°F | Ht 63.0 in | Wt 140.2 lb

## 2019-11-09 DIAGNOSIS — M25471 Effusion, right ankle: Secondary | ICD-10-CM | POA: Insufficient documentation

## 2019-11-09 DIAGNOSIS — M7522 Bicipital tendinitis, left shoulder: Secondary | ICD-10-CM | POA: Diagnosis not present

## 2019-11-09 DIAGNOSIS — M545 Low back pain: Secondary | ICD-10-CM | POA: Diagnosis not present

## 2019-11-09 DIAGNOSIS — M5416 Radiculopathy, lumbar region: Secondary | ICD-10-CM | POA: Diagnosis not present

## 2019-11-09 DIAGNOSIS — G8929 Other chronic pain: Secondary | ICD-10-CM | POA: Insufficient documentation

## 2019-11-09 DIAGNOSIS — G894 Chronic pain syndrome: Secondary | ICD-10-CM

## 2019-11-09 DIAGNOSIS — M961 Postlaminectomy syndrome, not elsewhere classified: Secondary | ICD-10-CM | POA: Insufficient documentation

## 2019-11-09 DIAGNOSIS — Z5181 Encounter for therapeutic drug level monitoring: Secondary | ICD-10-CM | POA: Diagnosis not present

## 2019-11-09 DIAGNOSIS — G253 Myoclonus: Secondary | ICD-10-CM | POA: Insufficient documentation

## 2019-11-09 DIAGNOSIS — M47816 Spondylosis without myelopathy or radiculopathy, lumbar region: Secondary | ICD-10-CM | POA: Diagnosis not present

## 2019-11-09 DIAGNOSIS — M533 Sacrococcygeal disorders, not elsewhere classified: Secondary | ICD-10-CM | POA: Insufficient documentation

## 2019-11-09 DIAGNOSIS — Z79891 Long term (current) use of opiate analgesic: Secondary | ICD-10-CM | POA: Insufficient documentation

## 2019-11-09 DIAGNOSIS — M2170 Unequal limb length (acquired), unspecified site: Secondary | ICD-10-CM | POA: Diagnosis not present

## 2019-11-09 MED ORDER — FENTANYL 25 MCG/HR TD PT72: 1.0000 | MEDICATED_PATCH | TRANSDERMAL | 0 refills | Status: DC

## 2019-11-09 MED ORDER — OXYCODONE-ACETAMINOPHEN 10-325 MG PO TABS: 1.0000 | ORAL_TABLET | Freq: Three times a day (TID) | ORAL | 0 refills | Status: DC | PRN

## 2019-11-09 MED ORDER — FENTANYL 25 MCG/HR TD PT72
1.0000 | MEDICATED_PATCH | TRANSDERMAL | 0 refills | Status: DC
Start: 2019-11-09 — End: 2020-01-11

## 2019-11-09 NOTE — Progress Notes (Signed)
Subjective:    Patient ID: Alexandria Mclaughlin, female    DOB: [birth year], 79 y.o.   MRN: [id number]  HPI: Alexandria Mclaughlin is a 79 y.o. female who returns for follow up appointment for chronic pain and medication refill. She reports sacral tenderness and rates her pain 4. Her current exercise regime is walking and performing stretching exercises.  Alexandria Mclaughlin reports diffuse wheal lesions on her upper and lower extremities for the last two months. Alexandria Mclaughlin states no changes in her medications, food, soap or laundry detergent. Also reports  her PCP and dermatologist is following. She has an appointment with dermatology 11/10/2019.  Alexandria Mclaughlin Mclaughlin equivalent is 105.00MME. She is also prescribed  Clonazepam. We have discussed the black box warning of using opioids and benzodiazepines. I highlighted the dangers of using these drugs together and discussed the adverse events including respiratory suppression, overdose, cognitive impairment and importance of compliance with current regimen. We will continue to monitor and adjust as indicated.   Oral Swab was Performed today.   Pain Inventory Average Pain 4 Pain Right Now 4 My pain is constant, sharp and stabbing  In the last 24 hours, has pain interfered with the following? General activity 6 Relation with others 5 Enjoyment of life 7 What TIME of day is your pain at its worst? daytime Sleep (in general) Good  Pain is worse with: walking, sitting and standing Pain improves with: rest, heat/ice and medication Relief from Meds: 6  Family History  Problem Relation Age of Onset  . Diabetes Mother   . Heart disease Mother   . Stroke Mother   . Heart disease Father   . Heart disease Brother    Social History   Socioeconomic History  . Marital status: Widowed    Spouse name: Not on file  . Number of children: Not on file  . Years of education: Not on file  . Highest education level: Not on file  Occupational History  . Not on file  Tobacco Use    . Smoking status: Former Smoker    Packs/day: 0.50    Years: 5.00    Pack years: 2.50    Types: Cigarettes    Quit date: 03/15/1968    Years since quitting: 51.6  . Smokeless tobacco: Never Used  Vaping Use  . Vaping Use: Never used  Substance and Sexual Activity  . Alcohol use: Not Currently    Alcohol/week: 1.0 standard drink    Types: 1 Glasses of wine per week    Comment: occasional glass of wine  . Drug use: No  . Sexual activity: Not on file  Other Topics Concern  . Not on file  Social History Narrative  . Not on file   Social Determinants of Health   Financial Resource Strain:   . Difficulty of Paying Living Expenses: Not on file  Food Insecurity:   . Worried About Charity fundraiser in the Last Year: Not on file  . Ran Out of Food in the Last Year: Not on file  Transportation Needs:   . Lack of Transportation (Medical): Not on file  . Lack of Transportation (Non-Medical): Not on file  Physical Activity:   . Days of Exercise per Week: Not on file  . Minutes of Exercise per Session: Not on file  Stress:   . Feeling of Stress : Not on file  Social Connections:   . Frequency of Communication with Friends and Family: Not on file  .  Frequency of Social Gatherings with Friends and Family: Not on file  . Attends Religious Services: Not on file  . Active Member of Clubs or Organizations: Not on file  . Attends Archivist Meetings: Not on file  . Marital Status: Not on file   Past Surgical History:  Procedure Laterality Date  . ABDOMINAL HYSTERECTOMY    . BACK SURGERY     2011  . BLADDER SURGERY  1985  . CESAREAN SECTION     x2  . RETINAL DETACHMENT SURGERY  9/13  . TRANSFORAMINAL LUMBAR INTERBODY FUSION W/ MIS 1 LEVEL Right 04/26/2018   Procedure: Right Lumbar four-five Minimally invasive Transforaminal lumbar interbody fusion;  Surgeon: Judith Part, MD;  Location: [facility];  Service: Neurosurgery;  Laterality: Right;   Past Surgical History:   Procedure Laterality Date  . ABDOMINAL HYSTERECTOMY    . BACK SURGERY     2011  . BLADDER SURGERY  1985  . CESAREAN SECTION     x2  . RETINAL DETACHMENT SURGERY  9/13  . TRANSFORAMINAL LUMBAR INTERBODY FUSION W/ MIS 1 LEVEL Right 04/26/2018   Procedure: Right Lumbar four-five Minimally invasive Transforaminal lumbar interbody fusion;  Surgeon: Judith Part, MD;  Location: [place];  Service: Neurosurgery;  Laterality: Right;   Past Medical History:  Diagnosis Date  . Cervical facet syndrome   . Depression   . Disorders of sacrum   . Enthesopathy of hip region   . GERD (gastroesophageal reflux disease)   . Headache   . History of kidney stones   . Hypertension   . Sciatic neuritis   . Synovitis and tenosynovitis   . Vision abnormalities    BP 126/84   Pulse 72   Temp 98.6 F (37 C)   Ht 5\' 3"  (1.6 m)   Wt 140 lb 3.2 oz (63.6 kg)   SpO2 91%   BMI 24.84 kg/m   Opioid Risk Score:   Fall Risk Score:  `1  Depression screen PHQ 2/9  Depression screen [facility] 2/9 09/15/2019 01/25/2018 10/12/2017 06/03/2017 04/08/2017 10/22/2015 05/23/2015  Decreased Interest 0 0 0 0 0 0 0  Down, Depressed, Hopeless 0 0 0 0 0 0 0  PHQ - 2 Score 0 0 0 0 0 0 0  Altered sleeping - - - 0 - - 0  Tired, decreased energy - - - 0 - - 3  Change in appetite - - - 0 - - 3  Feeling bad or failure about yourself  - - - 0 - - 0  Trouble concentrating - - - 0 - - 0  Moving slowly or fidgety/restless - - - 0 - - 0  Suicidal thoughts - - - 0 - - 0  PHQ-9 Score - - - 0 - - 6  Some recent data might be hidden   Review of Systems  Musculoskeletal:       Hip pain buttocks  Neurological: Positive for numbness.  All other systems reviewed and are negative.      Objective:   Physical Exam Vitals and nursing note reviewed.  Constitutional:      Appearance: Normal appearance.  Cardiovascular:     Rate and Rhythm: Normal rate and regular rhythm.     Pulses: Normal pulses.     Heart sounds: Normal heart  sounds.  Pulmonary:     Effort: Pulmonary effort is normal.     Breath sounds: Normal breath sounds.  Musculoskeletal:     Cervical back:  Normal range of motion and neck supple.  Skin:    General: Skin is warm and dry.     Comments: Wheal Lesions on Upper and Lower Extremities  Neurological:     Mental Status: She is alert and oriented to person, place, and time.  Psychiatric:        Mood and Affect: Mood normal.        Behavior: Behavior normal.           Assessment & Plan:  1. Facet Arthropathy/ Sacroiliac Joint Dysfunction:11/09/2019. Refilled:Fentanyl 25 mcg patch # 10- one patch every 3 days and Continue Percocet 10/325 mgone every8hours as needed #90,  Second script of given for the following month of her Fentanyl. Continue to Monitor. . We will continue the opioid monitoring program, this consists of regular clinic visits, examinations, urine drug screen, pill counts as well as use of [place] Controlled Substance Reporting system. A 12 month History has been reviewed on the [place] on 11/09/2019.  2. Lumbar Radicular Pain:No complaints today. Continue HEP as Tolerated. Continue to Monitor. Continue Gabapentin and Cymbalta. 11/09/2019 3. Gait Disorder: Continue HEP /Neurology Following.11/09/2019 4. Myoclonus with sleep: Continue Klonopin.11/09/2019. Continue current medication regimen. Continue to Monitor. 5. ? Urticaria: PCP and Dermatology Following. Continue to Monitor. Ms. Kiesel has a scheduled appointment with dermatology on 11/10/2019. She will keep this provider updated on the above.   76minutes of face to face patient care time was spent during this visit. All questions were encouraged and answered.   F/U in 70months

## 2019-11-10 ENCOUNTER — Encounter: Payer: Medicare Other | Admitting: Registered Nurse

## 2019-11-10 DIAGNOSIS — L089 Local infection of the skin and subcutaneous tissue, unspecified: Secondary | ICD-10-CM | POA: Diagnosis not present

## 2019-11-10 DIAGNOSIS — W57XXXA Bitten or stung by nonvenomous insect and other nonvenomous arthropods, initial encounter: Secondary | ICD-10-CM | POA: Diagnosis not present

## 2019-11-10 DIAGNOSIS — L309 Dermatitis, unspecified: Secondary | ICD-10-CM | POA: Diagnosis not present

## 2019-11-11 ENCOUNTER — Encounter: Payer: Self-pay | Admitting: Registered Nurse

## 2019-11-13 LAB — DRUG TOX MONITOR 1 W/CONF, ORAL FLD
Amphetamines: NEGATIVE ng/mL (ref <10)
Barbiturates: NEGATIVE ng/mL (ref <10)
Benzodiazepines: NEGATIVE ng/mL (ref <0.50)
Buprenorphine: NEGATIVE ng/mL (ref <0.10)
Cocaine: NEGATIVE ng/mL (ref <5.0)
Codeine: NEGATIVE ng/mL (ref <2.5)
Dihydrocodeine: NEGATIVE ng/mL (ref <2.5)
Fentanyl: 0.79 ng/mL — ABNORMAL HIGH (ref <0.10)
Fentanyl: POSITIVE ng/mL — AB (ref <0.10)
Heroin Metabolite: NEGATIVE ng/mL (ref <1.0)
Hydrocodone: NEGATIVE ng/mL (ref <2.5)
Hydromorphone: NEGATIVE ng/mL (ref <2.5)
MARIJUANA: NEGATIVE ng/mL (ref <2.5)
MDMA: NEGATIVE ng/mL (ref <10)
Meprobamate: NEGATIVE ng/mL (ref <2.5)
Methadone: NEGATIVE ng/mL (ref <5.0)
Morphine: NEGATIVE ng/mL (ref <2.5)
Nicotine Metabolite: NEGATIVE ng/mL (ref <5.0)
Norhydrocodone: NEGATIVE ng/mL (ref <2.5)
Noroxycodone: 9.1 ng/mL — ABNORMAL HIGH (ref <2.5)
Opiates: POSITIVE ng/mL — AB (ref <2.5)
Oxycodone: 44.1 ng/mL — ABNORMAL HIGH (ref <2.5)
Oxymorphone: NEGATIVE ng/mL (ref <2.5)
Phencyclidine: NEGATIVE ng/mL (ref <10)
Tapentadol: NEGATIVE ng/mL (ref <5.0)
Tramadol: NEGATIVE ng/mL (ref <5.0)
Zolpidem: NEGATIVE ng/mL (ref <5.0)

## 2019-11-13 LAB — DRUG TOX ALC METAB W/CON, ORAL FLD: Alcohol Metabolite: NEGATIVE ng/mL (ref <25)

## 2019-11-17 ENCOUNTER — Telehealth: Payer: Self-pay | Admitting: *Deleted

## 2019-11-17 NOTE — Telephone Encounter (Signed)
Oral swab drug screen was consistent for prescribed medications.  ?

## 2019-12-06 DIAGNOSIS — S90129A Contusion of unspecified lesser toe(s) without damage to nail, initial encounter: Secondary | ICD-10-CM | POA: Diagnosis not present

## 2020-01-11 ENCOUNTER — Encounter: Payer: Self-pay | Admitting: Registered Nurse

## 2020-01-11 ENCOUNTER — Other Ambulatory Visit: Payer: Self-pay

## 2020-01-11 ENCOUNTER — Encounter: Payer: Medicare Other | Attending: Physical Medicine and Rehabilitation | Admitting: Registered Nurse

## 2020-01-11 VITALS — BP 135/84 | HR 65 | Temp 98.7°F | Ht 63.0 in | Wt 139.0 lb

## 2020-01-11 DIAGNOSIS — M7522 Bicipital tendinitis, left shoulder: Secondary | ICD-10-CM | POA: Diagnosis not present

## 2020-01-11 DIAGNOSIS — Z5181 Encounter for therapeutic drug level monitoring: Secondary | ICD-10-CM | POA: Diagnosis not present

## 2020-01-11 DIAGNOSIS — G253 Myoclonus: Secondary | ICD-10-CM | POA: Diagnosis not present

## 2020-01-11 DIAGNOSIS — Z79891 Long term (current) use of opiate analgesic: Secondary | ICD-10-CM | POA: Insufficient documentation

## 2020-01-11 DIAGNOSIS — G894 Chronic pain syndrome: Secondary | ICD-10-CM | POA: Diagnosis not present

## 2020-01-11 DIAGNOSIS — M961 Postlaminectomy syndrome, not elsewhere classified: Secondary | ICD-10-CM

## 2020-01-11 DIAGNOSIS — M533 Sacrococcygeal disorders, not elsewhere classified: Secondary | ICD-10-CM | POA: Insufficient documentation

## 2020-01-11 DIAGNOSIS — M47816 Spondylosis without myelopathy or radiculopathy, lumbar region: Secondary | ICD-10-CM | POA: Diagnosis not present

## 2020-01-11 MED ORDER — FENTANYL 25 MCG/HR TD PT72: 1.0000 | MEDICATED_PATCH | TRANSDERMAL | 0 refills | Status: DC

## 2020-01-11 MED ORDER — OXYCODONE-ACETAMINOPHEN 10-325 MG PO TABS
1.0000 | ORAL_TABLET | Freq: Three times a day (TID) | ORAL | 0 refills | Status: DC | PRN
Start: 2020-01-11 — End: 2020-01-11

## 2020-01-11 MED ORDER — CLONAZEPAM 0.5 MG PO TABS: ORAL_TABLET | ORAL | 2 refills | Status: DC

## 2020-01-11 MED ORDER — OXYCODONE-ACETAMINOPHEN 10-325 MG PO TABS
1.0000 | ORAL_TABLET | Freq: Three times a day (TID) | ORAL | 0 refills | Status: DC | PRN
Start: 2020-01-11 — End: 2020-03-13
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
## 2020-01-16 LAB — DRUG TOX MONITOR 1 W/CONF, ORAL FLD
Amphetamines: NEGATIVE ng/mL (ref <10)
Barbiturates: NEGATIVE ng/mL (ref <10)
Benzodiazepines: NEGATIVE ng/mL (ref <0.50)
Buprenorphine: NEGATIVE ng/mL (ref <0.10)
Cocaine: NEGATIVE ng/mL (ref <5.0)
Codeine: NEGATIVE ng/mL (ref <2.5)
Dihydrocodeine: NEGATIVE ng/mL (ref <2.5)
Fentanyl: 0.88 ng/mL — ABNORMAL HIGH (ref <0.10)
Fentanyl: POSITIVE ng/mL — AB (ref <0.10)
Heroin Metabolite: NEGATIVE ng/mL (ref <1.0)
Hydrocodone: NEGATIVE ng/mL (ref <2.5)
Hydromorphone: NEGATIVE ng/mL (ref <2.5)
MARIJUANA: NEGATIVE ng/mL (ref <2.5)
MDMA: NEGATIVE ng/mL (ref <10)
Meprobamate: NEGATIVE ng/mL (ref <2.5)
Methadone: NEGATIVE ng/mL (ref <5.0)
Morphine: NEGATIVE ng/mL (ref <2.5)
Nicotine Metabolite: NEGATIVE ng/mL (ref <5.0)
Norhydrocodone: NEGATIVE ng/mL (ref <2.5)
Noroxycodone: 12.1 ng/mL — ABNORMAL HIGH (ref <2.5)
Opiates: POSITIVE ng/mL — AB (ref <2.5)
Oxycodone: 39.5 ng/mL — ABNORMAL HIGH (ref <2.5)
Oxymorphone: NEGATIVE ng/mL (ref <2.5)
Phencyclidine: NEGATIVE ng/mL (ref <10)
Tapentadol: NEGATIVE ng/mL (ref <5.0)
Tramadol: NEGATIVE ng/mL (ref <5.0)
Zolpidem: NEGATIVE ng/mL (ref <5.0)

## 2020-01-16 LAB — DRUG TOX ALC METAB W/CON, ORAL FLD: Alcohol Metabolite: NEGATIVE ng/mL (ref <25)

## 2020-01-27 DIAGNOSIS — I10 Essential (primary) hypertension: Secondary | ICD-10-CM | POA: Diagnosis not present

## 2020-01-27 DIAGNOSIS — E782 Mixed hyperlipidemia: Secondary | ICD-10-CM | POA: Diagnosis not present

## 2020-01-27 DIAGNOSIS — R7309 Other abnormal glucose: Secondary | ICD-10-CM | POA: Diagnosis not present

## 2020-01-30 ENCOUNTER — Telehealth: Payer: Self-pay | Admitting: *Deleted

## 2020-01-30 ENCOUNTER — Other Ambulatory Visit: Payer: Self-pay | Admitting: Registered Nurse

## 2020-01-30 DIAGNOSIS — M5416 Radiculopathy, lumbar region: Secondary | ICD-10-CM

## 2020-01-30 DIAGNOSIS — M961 Postlaminectomy syndrome, not elsewhere classified: Secondary | ICD-10-CM

## 2020-01-30 NOTE — Telephone Encounter (Signed)
Oral swab drug screen was consistent for prescribed medications.  ?

## 2020-02-02 DIAGNOSIS — E782 Mixed hyperlipidemia: Secondary | ICD-10-CM | POA: Diagnosis not present

## 2020-02-02 DIAGNOSIS — I10 Essential (primary) hypertension: Secondary | ICD-10-CM | POA: Diagnosis not present

## 2020-02-02 DIAGNOSIS — E0590 Thyrotoxicosis, unspecified without thyrotoxic crisis or storm: Secondary | ICD-10-CM | POA: Diagnosis not present

## 2020-02-02 DIAGNOSIS — I700 Atherosclerosis of aorta: Secondary | ICD-10-CM | POA: Diagnosis not present

## 2020-02-02 DIAGNOSIS — R7303 Prediabetes: Secondary | ICD-10-CM | POA: Diagnosis not present

## 2020-03-13 ENCOUNTER — Encounter: Payer: Self-pay | Admitting: Registered Nurse

## 2020-03-13 ENCOUNTER — Other Ambulatory Visit: Payer: Self-pay

## 2020-03-13 ENCOUNTER — Encounter: Payer: Medicare Other | Attending: Physical Medicine and Rehabilitation | Admitting: Registered Nurse

## 2020-03-13 VITALS — BP 138/80 | HR 74 | Ht 63.0 in | Wt 139.0 lb

## 2020-03-13 DIAGNOSIS — M961 Postlaminectomy syndrome, not elsewhere classified: Secondary | ICD-10-CM | POA: Diagnosis not present

## 2020-03-13 DIAGNOSIS — Z5181 Encounter for therapeutic drug level monitoring: Secondary | ICD-10-CM | POA: Diagnosis not present

## 2020-03-13 DIAGNOSIS — M533 Sacrococcygeal disorders, not elsewhere classified: Secondary | ICD-10-CM | POA: Diagnosis not present

## 2020-03-13 DIAGNOSIS — Z79891 Long term (current) use of opiate analgesic: Secondary | ICD-10-CM | POA: Insufficient documentation

## 2020-03-13 DIAGNOSIS — M47816 Spondylosis without myelopathy or radiculopathy, lumbar region: Secondary | ICD-10-CM

## 2020-03-13 DIAGNOSIS — G253 Myoclonus: Secondary | ICD-10-CM | POA: Insufficient documentation

## 2020-03-13 DIAGNOSIS — G894 Chronic pain syndrome: Secondary | ICD-10-CM | POA: Diagnosis not present

## 2020-03-13 DIAGNOSIS — M7522 Bicipital tendinitis, left shoulder: Secondary | ICD-10-CM

## 2020-03-13 MED ORDER — CLONAZEPAM 0.5 MG PO TABS: ORAL_TABLET | ORAL | 2 refills | Status: DC

## 2020-03-13 MED ORDER — FENTANYL 25 MCG/HR TD PT72: 1.0000 | MEDICATED_PATCH | TRANSDERMAL | 0 refills | Status: DC

## 2020-03-13 MED ORDER — OXYCODONE-ACETAMINOPHEN 10-325 MG PO TABS: 1.0000 | ORAL_TABLET | Freq: Four times a day (QID) | ORAL | 0 refills | Status: DC | PRN
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
## 2020-04-04 ENCOUNTER — Telehealth: Payer: Self-pay | Admitting: Registered Nurse

## 2020-04-04 DIAGNOSIS — M75101 Unspecified rotator cuff tear or rupture of right shoulder, not specified as traumatic: Secondary | ICD-10-CM

## 2020-04-04 DIAGNOSIS — M25511 Pain in right shoulder: Secondary | ICD-10-CM

## 2020-04-04 NOTE — Telephone Encounter (Signed)
Ms. Mcgurk sent a My-Chart message reporting right shoulder pain with decreased ROM. We will obtain a X-[place]. The above message was sent to Ms Gunderson via My-Chart message.

## 2020-04-06 ENCOUNTER — Ambulatory Visit
Admission: RE | Admit: 2020-04-06 | Discharge: 2020-04-06 | Disposition: A | Discharge status: Home / Self Care | Payer: Medicare Other | Source: Ambulatory Visit | Attending: Registered Nurse | Admitting: Registered Nurse

## 2020-04-06 DIAGNOSIS — M19011 Primary osteoarthritis, right shoulder: Secondary | ICD-10-CM | POA: Diagnosis not present

## 2020-04-09 ENCOUNTER — Telehealth: Payer: Self-pay | Admitting: Registered Nurse

## 2020-04-09 MED ORDER — METHYLPREDNISOLONE 4 MG PO TBPK: ORAL_TABLET | ORAL | 0 refills | Status: DC

## 2020-04-09 NOTE — Telephone Encounter (Signed)
Placed a call to Alexandria Mclaughlin regarding her X-[place] results. She refuses physical therapy at this time, she is unable to attend physical therapy at this time. At this time she will continue HEP as tolerated and alternate heat and ice therapy. Medrol dose prescribed. Alexandria Mclaughlin understanding.

## 2020-04-25 DIAGNOSIS — M810 Age-related osteoporosis without current pathological fracture: Secondary | ICD-10-CM | POA: Diagnosis not present

## 2020-05-09 ENCOUNTER — Encounter: Payer: Self-pay | Admitting: Registered Nurse

## 2020-05-09 ENCOUNTER — Encounter: Payer: Medicare Other | Attending: Physical Medicine and Rehabilitation | Admitting: Registered Nurse

## 2020-05-09 ENCOUNTER — Other Ambulatory Visit: Payer: Self-pay

## 2020-05-09 VITALS — BP 144/88 | HR 62 | Ht 63.0 in | Wt 136.6 lb

## 2020-05-09 DIAGNOSIS — G253 Myoclonus: Secondary | ICD-10-CM | POA: Insufficient documentation

## 2020-05-09 DIAGNOSIS — M7061 Trochanteric bursitis, right hip: Secondary | ICD-10-CM

## 2020-05-09 DIAGNOSIS — M47816 Spondylosis without myelopathy or radiculopathy, lumbar region: Secondary | ICD-10-CM | POA: Diagnosis not present

## 2020-05-09 DIAGNOSIS — H43813 Vitreous degeneration, bilateral: Secondary | ICD-10-CM | POA: Diagnosis not present

## 2020-05-09 DIAGNOSIS — M533 Sacrococcygeal disorders, not elsewhere classified: Secondary | ICD-10-CM | POA: Diagnosis not present

## 2020-05-09 DIAGNOSIS — H35373 Puckering of macula, bilateral: Secondary | ICD-10-CM | POA: Diagnosis not present

## 2020-05-09 DIAGNOSIS — Z5181 Encounter for therapeutic drug level monitoring: Secondary | ICD-10-CM | POA: Diagnosis not present

## 2020-05-09 DIAGNOSIS — G894 Chronic pain syndrome: Secondary | ICD-10-CM | POA: Insufficient documentation

## 2020-05-09 DIAGNOSIS — Z79891 Long term (current) use of opiate analgesic: Secondary | ICD-10-CM | POA: Diagnosis not present

## 2020-05-09 DIAGNOSIS — H353131 Nonexudative age-related macular degeneration, bilateral, early dry stage: Secondary | ICD-10-CM | POA: Diagnosis not present

## 2020-05-09 DIAGNOSIS — H532 Diplopia: Secondary | ICD-10-CM | POA: Diagnosis not present

## 2020-05-09 DIAGNOSIS — M7522 Bicipital tendinitis, left shoulder: Secondary | ICD-10-CM | POA: Diagnosis not present

## 2020-05-09 DIAGNOSIS — H40023 Open angle with borderline findings, high risk, bilateral: Secondary | ICD-10-CM | POA: Diagnosis not present

## 2020-05-09 DIAGNOSIS — M961 Postlaminectomy syndrome, not elsewhere classified: Secondary | ICD-10-CM

## 2020-05-09 DIAGNOSIS — Z961 Presence of intraocular lens: Secondary | ICD-10-CM | POA: Diagnosis not present

## 2020-05-09 MED ORDER — OXYCODONE-ACETAMINOPHEN 10-325 MG PO TABS: 1.0000 | ORAL_TABLET | Freq: Four times a day (QID) | ORAL | 0 refills | Status: DC | PRN

## 2020-05-09 MED ORDER — FENTANYL 25 MCG/HR TD PT72: 1.0000 | MEDICATED_PATCH | TRANSDERMAL | 0 refills | Status: DC

## 2020-05-09 MED ORDER — CLONAZEPAM 0.5 MG PO TABS: ORAL_TABLET | ORAL | 2 refills | Status: DC

## 2020-05-09 NOTE — Progress Notes (Signed)
Subjective:    Patient ID: Alexandria Mclaughlin, female    DOB: 1940/12/25, 80 y.o.   MRN: [id number]  HPI: Alexandria Mclaughlin is a 80 y.o. female who returns for follow up appointment for chronic pain and medication refill. She states her pain is located in her lower back and right hip pain. She rates her pain 5. Her current exercise regime is walking and performing stretching exercises.  [name] Morphine equivalent is 121.11  MME. She  is also prescribed Clonazepam. We have discussed the black box warning of using opioids and benzodiazepines. I highlighted the dangers of using these drugs together and discussed the adverse events including respiratory suppression, overdose, cognitive impairment and importance of compliance with current regimen. We will continue to monitor and adjust as indicated.   Last Oral Swab was Performed on 01/11/2020, it was consistent.    Pain Inventory Average Pain 4 Pain Right Now 5 My pain is constant, sharp and stabbing  In the last 24 hours, has pain interfered with the following? General activity 4 Relation with others 4 Enjoyment of life 4 What TIME of day is your pain at its worst? daytime Sleep (in general) Fair  Pain is worse with: walking, bending, sitting and standing Pain improves with: rest, heat/ice and medication Relief from Meds: 4  Family History  Problem Relation Age of Onset  . Diabetes Mother   . Heart disease Mother   . Stroke Mother   . Heart disease Father   . Heart disease Brother    Social History   Socioeconomic History  . Marital status: Widowed    Spouse name: Not on file  . Number of children: Not on file  . Years of education: Not on file  . Highest education level: Not on file  Occupational History  . Not on file  Tobacco Use  . Smoking status: Former Smoker    Packs/day: 0.50    Years: 5.00    Pack years: 2.50    Types: Cigarettes    Quit date: 03/15/1968    Years since quitting: 52.1  . Smokeless tobacco: Never Used   Vaping Use  . Vaping Use: Never used  Substance and Sexual Activity  . Alcohol use: Not Currently    Alcohol/week: 1.0 standard drink    Types: 1 Glasses of wine per week    Comment: occasional glass of wine  . Drug use: No  . Sexual activity: Not on file  Other Topics Concern  . Not on file  Social History Narrative  . Not on file   Social Determinants of Health   Financial Resource Strain: Not on file  Food Insecurity: Not on file  Transportation Needs: Not on file  Physical Activity: Not on file  Stress: Not on file  Social Connections: Not on file   Past Surgical History:  Procedure Laterality Date  . ABDOMINAL HYSTERECTOMY    . BACK SURGERY     2011  . BLADDER SURGERY  1985  . CESAREAN SECTION     x2  . RETINAL DETACHMENT SURGERY  9/13  . TRANSFORAMINAL LUMBAR INTERBODY FUSION W/ MIS 1 LEVEL Right 04/26/2018   Procedure: Right Lumbar four-five Minimally invasive Transforaminal lumbar interbody fusion;  Surgeon: Judith Part, MD;  Location: [place];  Service: Neurosurgery;  Laterality: Right;   Past Surgical History:  Procedure Laterality Date  . ABDOMINAL HYSTERECTOMY    . BACK SURGERY     2011  . BLADDER SURGERY  1985  .  CESAREAN SECTION     x2  . RETINAL DETACHMENT SURGERY  9/13  . TRANSFORAMINAL LUMBAR INTERBODY FUSION W/ MIS 1 LEVEL Right 04/26/2018   Procedure: Right Lumbar four-five Minimally invasive Transforaminal lumbar interbody fusion;  Surgeon: Judith Part, MD;  Location: [place];  Service: Neurosurgery;  Laterality: Right;   Past Medical History:  Diagnosis Date  . Cervical facet syndrome   . Depression   . Disorders of sacrum   . Enthesopathy of hip region   . GERD (gastroesophageal reflux disease)   . Headache   . History of kidney stones   . Hypertension   . Sciatic neuritis   . Synovitis and tenosynovitis   . Vision abnormalities    BP (!) 144/88   Opioid Risk Score:   Fall Risk Score:  `1  Depression screen PHQ  2/9  Depression screen [facility] 2/9 09/15/2019 01/25/2018 10/12/2017 06/03/2017 04/08/2017 10/22/2015 05/23/2015  Decreased Interest 0 0 0 0 0 0 0  Down, Depressed, Hopeless 0 0 0 0 0 0 0  PHQ - 2 Score 0 0 0 0 0 0 0  Altered sleeping - - - 0 - - 0  Tired, decreased energy - - - 0 - - 3  Change in appetite - - - 0 - - 3  Feeling bad or failure about yourself  - - - 0 - - 0  Trouble concentrating - - - 0 - - 0  Moving slowly or fidgety/restless - - - 0 - - 0  Suicidal thoughts - - - 0 - - 0  PHQ-9 Score - - - 0 - - 6  Some recent data might be hidden     Review of Systems  Musculoskeletal: Positive for back pain.  All other systems reviewed and are negative.      Objective:   Physical Exam Vitals and nursing note reviewed.  Constitutional:      Appearance: Normal appearance.  Cardiovascular:     Rate and Rhythm: Normal rate and regular rhythm.     Pulses: Normal pulses.     Heart sounds: Normal heart sounds.  Pulmonary:     Effort: Pulmonary effort is normal.     Breath sounds: Normal breath sounds.  Musculoskeletal:     Cervical back: Normal range of motion and neck supple.     Comments: Normal Muscle Bulk and Muscle Testing Reveals:  Upper Extremities: Full ROM and Muscle Strength 5/5  Lumbar Paraspinal Tenderness: L-3-L-5 Right Greater Trochanter Tenderness Lower Extremities: Full ROM and Muscle Strength 5/5 Arises from Table with ease Narrow Based  Gait   Skin:    General: Skin is warm and dry.  Neurological:     Mental Status: She is alert and oriented to person, place, and time.  Psychiatric:        Mood and Affect: Mood normal.        Behavior: Behavior normal.           Assessment & Plan:  1.Facet Arthropathy/ Sacroiliac Joint Dysfunction:05/09/2020. Refilled:Fentanyl 25 mcg patch # 10- one patch every 3 days and Percocet 10/325 mgone every6hours as needed #110,Second script of given for the following month of her Fentanyl. Continue to  Monitor.. We will continue the opioid monitoring program, this consists of regular clinic visits, examinations, urine drug screen, pill counts as well as use of [place] Controlled Substance Reporting system. A 12 month History has been reviewed on the [place] Controlled Substance Reporting Systemon03/16/2022. 2. Lumbar Radicular Pain:No  complaints today.Continue HEP as Tolerated. Continue to Monitor.Continue Gabapentin and Cymbalta.05/09/2020 3. Gait Disorder: Continue HEP /Neurology Following.05/09/2020 4. Myoclonus with sleep: Continue Klonopin.05/09/2020. Continue current medication regimen. Continue to Monitor.   F/U in 11months

## 2020-05-09 NOTE — Patient Instructions (Signed)
Check your Oxycodone on 07/02/2020, and send a My-Chart Message with your count

## 2020-07-10 ENCOUNTER — Other Ambulatory Visit: Payer: Self-pay | Admitting: Registered Nurse

## 2020-07-17 ENCOUNTER — Telehealth: Payer: Self-pay

## 2020-07-17 DIAGNOSIS — M7522 Bicipital tendinitis, left shoulder: Secondary | ICD-10-CM

## 2020-07-17 MED ORDER — OXYCODONE-ACETAMINOPHEN 10-325 MG PO TABS: 1.0000 | ORAL_TABLET | Freq: Four times a day (QID) | ORAL | 0 refills | Status: DC | PRN

## 2020-07-17 NOTE — Telephone Encounter (Signed)
Alen Blew called regarding oxycodone medication states she  Is out per PMP Last filled 06/11/2020 for #120 . Her last appt was 05/09/2020 and her next appt is 07/24/2020 . Her phone number is [phone]. Thank you

## 2020-07-17 NOTE — Telephone Encounter (Signed)
PMP was Reviewed. Oxycodone e-scribed. Placed a call to Ms. Baswell, she verbalizes understanding.

## 2020-07-19 ENCOUNTER — Ambulatory Visit: Payer: Medicare Other | Admitting: Registered Nurse

## 2020-07-23 ENCOUNTER — Other Ambulatory Visit: Payer: Self-pay | Admitting: Physical Medicine & Rehabilitation

## 2020-07-24 ENCOUNTER — Encounter: Payer: Medicare Other | Attending: Physical Medicine and Rehabilitation | Admitting: Registered Nurse

## 2020-07-24 ENCOUNTER — Other Ambulatory Visit: Payer: Self-pay

## 2020-07-24 ENCOUNTER — Encounter: Payer: Self-pay | Admitting: Registered Nurse

## 2020-07-24 VITALS — BP 127/79 | HR 79 | Temp 98.7°F | Ht 63.0 in | Wt 139.8 lb

## 2020-07-24 DIAGNOSIS — G253 Myoclonus: Secondary | ICD-10-CM | POA: Diagnosis present

## 2020-07-24 DIAGNOSIS — Z79891 Long term (current) use of opiate analgesic: Secondary | ICD-10-CM | POA: Diagnosis not present

## 2020-07-24 DIAGNOSIS — M961 Postlaminectomy syndrome, not elsewhere classified: Secondary | ICD-10-CM | POA: Diagnosis not present

## 2020-07-24 DIAGNOSIS — Z5181 Encounter for therapeutic drug level monitoring: Secondary | ICD-10-CM | POA: Insufficient documentation

## 2020-07-24 DIAGNOSIS — G894 Chronic pain syndrome: Secondary | ICD-10-CM | POA: Insufficient documentation

## 2020-07-24 DIAGNOSIS — M47816 Spondylosis without myelopathy or radiculopathy, lumbar region: Secondary | ICD-10-CM | POA: Diagnosis not present

## 2020-07-24 DIAGNOSIS — M533 Sacrococcygeal disorders, not elsewhere classified: Secondary | ICD-10-CM | POA: Insufficient documentation

## 2020-07-24 DIAGNOSIS — M7522 Bicipital tendinitis, left shoulder: Secondary | ICD-10-CM | POA: Insufficient documentation

## 2020-07-24 MED ORDER — FENTANYL 25 MCG/HR TD PT72: 1.0000 | MEDICATED_PATCH | TRANSDERMAL | 0 refills | Status: DC

## 2020-07-24 MED ORDER — OXYCODONE-ACETAMINOPHEN 10-325 MG PO TABS: 1.0000 | ORAL_TABLET | Freq: Four times a day (QID) | ORAL | 0 refills | Status: DC | PRN

## 2020-07-24 MED ORDER — CLONAZEPAM 0.5 MG PO TABS: ORAL_TABLET | ORAL | 2 refills | Status: DC

## 2020-07-24 NOTE — Progress Notes (Signed)
Subjective:    Patient ID: Alexandria Mclaughlin, female    DOB: [birth year], 80 y.o.   MRN: [id number]  HPI: Alexandria Mclaughlin is a 80 y.o. female who returns for follow up appointment for chronic pain and medication refill. She states her pain is located in her lower back and sacral pain. She rates her pain 4. Her current exercise regime is walking and performing stretching exercises.  Alexandria Mclaughlin Morphine equivalent is 100.78 MME.  She is also prescribed Clonazepam. We have discussed the black box warning of using opioids and benzodiazepines. I highlighted the dangers of using these drugs together and discussed the adverse events including respiratory suppression, overdose, cognitive impairment and importance of compliance with current regimen. We will continue to monitor and adjust as indicated.    Last Oral Swab was Performed on 01/11/2020, it was consistent.   Pain Inventory Average Pain 4 Pain Right Now 4 My pain is intermittent, sharp and stabbing  In the last 24 hours, has pain interfered with the following? General activity 5 Relation with others 5 Enjoyment of life 5 What TIME of day is your pain at its worst? daytime Sleep (in general) Good  Pain is worse with: walking, sitting and standing Pain improves with: rest, heat/ice and medication Relief from Meds: 5  Family History  Problem Relation Age of Onset  . Diabetes Mother   . Heart disease Mother   . Stroke Mother   . Heart disease Father   . Heart disease Brother    Social History   Socioeconomic History  . Marital status: Widowed    Spouse name: Not on file  . Number of children: Not on file  . Years of education: Not on file  . Highest education level: Not on file  Occupational History  . Not on file  Tobacco Use  . Smoking status: Former Smoker    Packs/day: 0.50    Years: 5.00    Pack years: 2.50    Types: Cigarettes    Quit date: 03/15/1968    Years since quitting: 52.3  . Smokeless tobacco: Never Used  Vaping Use   . Vaping Use: Never used  Substance and Sexual Activity  . Alcohol use: Not Currently    Alcohol/week: 1.0 standard drink    Types: 1 Glasses of wine per week    Comment: occasional glass of wine  . Drug use: No  . Sexual activity: Not on file  Other Topics Concern  . Not on file  Social History Narrative  . Not on file   Social Determinants of Health   Financial Resource Strain: Not on file  Food Insecurity: Not on file  Transportation Needs: Not on file  Physical Activity: Not on file  Stress: Not on file  Social Connections: Not on file   Past Surgical History:  Procedure Laterality Date  . ABDOMINAL HYSTERECTOMY    . BACK SURGERY     2011  . BLADDER SURGERY  1985  . CESAREAN SECTION     x2  . RETINAL DETACHMENT SURGERY  9/13  . TRANSFORAMINAL LUMBAR INTERBODY FUSION W/ MIS 1 LEVEL Right 04/26/2018   Procedure: Right Lumbar four-five Minimally invasive Transforaminal lumbar interbody fusion;  Surgeon: Judith Part, MD;  Location: [facility];  Service: Neurosurgery;  Laterality: Right;   Past Surgical History:  Procedure Laterality Date  . ABDOMINAL HYSTERECTOMY    . BACK SURGERY     2011  . BLADDER SURGERY  1985  . CESAREAN  SECTION     x2  . RETINAL DETACHMENT SURGERY  9/13  . TRANSFORAMINAL LUMBAR INTERBODY FUSION W/ MIS 1 LEVEL Right 04/26/2018   Procedure: Right Lumbar four-five Minimally invasive Transforaminal lumbar interbody fusion;  Surgeon: Judith Part, MD;  Location: [facility];  Service: Neurosurgery;  Laterality: Right;   Past Medical History:  Diagnosis Date  . Cervical facet syndrome   . Depression   . Disorders of sacrum   . Enthesopathy of hip region   . GERD (gastroesophageal reflux disease)   . Headache   . History of kidney stones   . Hypertension   . Sciatic neuritis   . Synovitis and tenosynovitis   . Vision abnormalities    BP 127/79   Pulse 79   Temp 98.7 F (37.1 C)   Ht 5\' 3"  (1.6 m)   Wt 139 lb 12.8 oz (63.4 kg)   SpO2  93%   BMI 24.76 kg/m   Opioid Risk Score:   Fall Risk Score:  `1  Depression screen PHQ 2/9  Depression screen [facility] 2/9 05/09/2020 09/15/2019 01/25/2018 10/12/2017 06/03/2017 04/08/2017 10/22/2015  Decreased Interest 0 0 0 0 0 0 0  Down, Depressed, Hopeless 0 0 0 0 0 0 0  PHQ - 2 Score 0 0 0 0 0 0 0  Altered sleeping - - - - 0 - -  Tired, decreased energy - - - - 0 - -  Change in appetite - - - - 0 - -  Feeling bad or failure about yourself  - - - - 0 - -  Trouble concentrating - - - - 0 - -  Moving slowly or fidgety/restless - - - - 0 - -  Suicidal thoughts - - - - 0 - -  PHQ-9 Score - - - - 0 - -  Some recent data might be hidden   Review of Systems  Musculoskeletal:       Right buttock pain  All other systems reviewed and are negative.      Objective:   Physical Exam Vitals and nursing note reviewed.  Constitutional:      Appearance: Normal appearance.  Cardiovascular:     Rate and Rhythm: Normal rate and regular rhythm.     Pulses: Normal pulses.     Heart sounds: Normal heart sounds.  Pulmonary:     Effort: Pulmonary effort is normal.     Breath sounds: Normal breath sounds.  Musculoskeletal:     Cervical back: Normal range of motion and neck supple.     Comments: Normal Muscle Bulk and Muscle Testing Reveals:  Upper Extremities: Full ROM and Muscle Strength 5/5  Lower Extremities: Full ROM and Muscle Strength 5/5 Arises from chair with with ease Narrow Based Gait   Skin:    General: Skin is warm and dry.  Neurological:     Mental Status: She is oriented to person, place, and time.  Psychiatric:        Mood and Affect: Mood normal.        Behavior: Behavior normal.           Assessment & Plan:  1.Facet Arthropathy/ Sacroiliac Joint Dysfunction:07/24/2020. Refilled:Fentanyl 25 mcg patch # 10- one patch every 3 days and Percocet 10/325 mgone every6hours as needed #110,Second script of given for the following month of her Fentanyl. Continue to  Monitor.. We will continue the opioid monitoring program, this consists of regular clinic visits, examinations, urine drug screen, pill counts as well as  use of [place] Controlled Substance Reporting system. A 12 month History has been reviewed on the [place] Controlled Substance Reporting Systemon05/31/2022. 2. Lumbar Radicular Pain:No complaints today.Continue HEP as Tolerated. Continue to Monitor.Continue Gabapentin and Cymbalta.05/09/2020 3. Gait Disorder: Continue HEP /Neurology Following.07/24/2020 4. Myoclonus with sleep: Continue Klonopin. Continue current medication regimen. Continue to Monitor.07/24/2020   F/U in 51months

## 2020-07-26 DIAGNOSIS — R5383 Other fatigue: Secondary | ICD-10-CM | POA: Diagnosis not present

## 2020-07-26 DIAGNOSIS — I10 Essential (primary) hypertension: Secondary | ICD-10-CM | POA: Diagnosis not present

## 2020-07-26 DIAGNOSIS — E0590 Thyrotoxicosis, unspecified without thyrotoxic crisis or storm: Secondary | ICD-10-CM | POA: Diagnosis not present

## 2020-07-26 DIAGNOSIS — E782 Mixed hyperlipidemia: Secondary | ICD-10-CM | POA: Diagnosis not present

## 2020-07-31 DIAGNOSIS — Z0000 Encounter for general adult medical examination without abnormal findings: Secondary | ICD-10-CM | POA: Diagnosis not present

## 2020-08-02 DIAGNOSIS — R7303 Prediabetes: Secondary | ICD-10-CM | POA: Diagnosis not present

## 2020-08-02 DIAGNOSIS — I10 Essential (primary) hypertension: Secondary | ICD-10-CM | POA: Diagnosis not present

## 2020-08-02 DIAGNOSIS — I700 Atherosclerosis of aorta: Secondary | ICD-10-CM | POA: Diagnosis not present

## 2020-08-02 DIAGNOSIS — E782 Mixed hyperlipidemia: Secondary | ICD-10-CM | POA: Diagnosis not present

## 2020-08-02 DIAGNOSIS — E041 Nontoxic single thyroid nodule: Secondary | ICD-10-CM | POA: Diagnosis not present

## 2020-08-02 DIAGNOSIS — Z0000 Encounter for general adult medical examination without abnormal findings: Secondary | ICD-10-CM | POA: Diagnosis not present

## 2020-08-02 DIAGNOSIS — R7989 Other specified abnormal findings of blood chemistry: Secondary | ICD-10-CM | POA: Diagnosis not present

## 2020-08-07 ENCOUNTER — Other Ambulatory Visit: Payer: Self-pay | Admitting: Internal Medicine

## 2020-08-07 DIAGNOSIS — E041 Nontoxic single thyroid nodule: Secondary | ICD-10-CM

## 2020-08-08 ENCOUNTER — Other Ambulatory Visit: Payer: Self-pay | Admitting: Registered Nurse

## 2020-08-08 ENCOUNTER — Other Ambulatory Visit: Payer: Self-pay | Admitting: Physical Medicine & Rehabilitation

## 2020-08-08 DIAGNOSIS — M5416 Radiculopathy, lumbar region: Secondary | ICD-10-CM

## 2020-08-08 DIAGNOSIS — M961 Postlaminectomy syndrome, not elsewhere classified: Secondary | ICD-10-CM

## 2020-08-22 ENCOUNTER — Ambulatory Visit
Admission: RE | Admit: 2020-08-22 | Discharge: 2020-08-22 | Disposition: A | Discharge status: Home / Self Care | Payer: Medicare Other | Source: Ambulatory Visit | Attending: Internal Medicine | Admitting: Internal Medicine

## 2020-08-22 ENCOUNTER — Other Ambulatory Visit: Payer: Self-pay

## 2020-08-22 DIAGNOSIS — E041 Nontoxic single thyroid nodule: Secondary | ICD-10-CM

## 2020-09-12 ENCOUNTER — Encounter: Payer: Medicare Other | Admitting: Registered Nurse

## 2020-09-18 ENCOUNTER — Other Ambulatory Visit: Payer: Self-pay

## 2020-09-18 ENCOUNTER — Ambulatory Visit: Payer: Medicare Other | Admitting: Registered Nurse

## 2020-09-18 DIAGNOSIS — M47816 Spondylosis without myelopathy or radiculopathy, lumbar region: Secondary | ICD-10-CM

## 2020-09-18 DIAGNOSIS — M7522 Bicipital tendinitis, left shoulder: Secondary | ICD-10-CM

## 2020-09-18 DIAGNOSIS — M533 Sacrococcygeal disorders, not elsewhere classified: Secondary | ICD-10-CM

## 2020-09-18 DIAGNOSIS — M961 Postlaminectomy syndrome, not elsewhere classified: Secondary | ICD-10-CM

## 2020-09-19 MED ORDER — FENTANYL 25 MCG/HR TD PT72: 1.0000 | MEDICATED_PATCH | TRANSDERMAL | 0 refills | Status: DC

## 2020-09-19 NOTE — Telephone Encounter (Signed)
PMP was Reviewed. Alexandria Mclaughlin already had Oxycodone prescription at the pharmacy. Fentanyl e-scribed today. Placed a call to Alexandria Mclaughlin regarding the above, she verbalizes understanding.

## 2020-09-24 ENCOUNTER — Ambulatory Visit: Payer: Medicare Other | Admitting: Registered Nurse

## 2020-10-02 ENCOUNTER — Ambulatory Visit: Payer: Medicare Other | Admitting: Registered Nurse

## 2020-10-17 ENCOUNTER — Encounter: Payer: Medicare Other | Attending: Physical Medicine and Rehabilitation | Admitting: Registered Nurse

## 2020-10-17 ENCOUNTER — Encounter: Payer: Self-pay | Admitting: Registered Nurse

## 2020-10-17 ENCOUNTER — Other Ambulatory Visit: Payer: Self-pay

## 2020-10-17 VITALS — BP 109/72 | HR 73 | Temp 98.8°F | Ht 63.0 in | Wt 140.6 lb

## 2020-10-17 DIAGNOSIS — Z79891 Long term (current) use of opiate analgesic: Secondary | ICD-10-CM | POA: Diagnosis not present

## 2020-10-17 DIAGNOSIS — M47816 Spondylosis without myelopathy or radiculopathy, lumbar region: Secondary | ICD-10-CM | POA: Diagnosis not present

## 2020-10-17 DIAGNOSIS — Z5181 Encounter for therapeutic drug level monitoring: Secondary | ICD-10-CM | POA: Insufficient documentation

## 2020-10-17 DIAGNOSIS — M961 Postlaminectomy syndrome, not elsewhere classified: Secondary | ICD-10-CM

## 2020-10-17 DIAGNOSIS — M7522 Bicipital tendinitis, left shoulder: Secondary | ICD-10-CM | POA: Diagnosis not present

## 2020-10-17 DIAGNOSIS — G894 Chronic pain syndrome: Secondary | ICD-10-CM | POA: Diagnosis not present

## 2020-10-17 DIAGNOSIS — M533 Sacrococcygeal disorders, not elsewhere classified: Secondary | ICD-10-CM

## 2020-10-17 DIAGNOSIS — M79671 Pain in right foot: Secondary | ICD-10-CM | POA: Insufficient documentation

## 2020-10-17 DIAGNOSIS — M5416 Radiculopathy, lumbar region: Secondary | ICD-10-CM | POA: Insufficient documentation

## 2020-10-17 MED ORDER — OXYCODONE-ACETAMINOPHEN 10-325 MG PO TABS: 1.0000 | ORAL_TABLET | Freq: Four times a day (QID) | ORAL | 0 refills | Status: DC | PRN

## 2020-10-17 MED ORDER — FENTANYL 25 MCG/HR TD PT72: 1.0000 | MEDICATED_PATCH | TRANSDERMAL | 0 refills | Status: DC

## 2020-10-17 NOTE — Progress Notes (Signed)
Subjective:    Patient ID: Alexandria Mclaughlin, female    DOB: Nov 27, 1940, 80 y.o.   MRN: [id number]  HPI: Alexandria Mclaughlin is a 80 y.o. female who returns for follow up appointment for chronic pain and medication refill. She states her pain is located in her  lower back radiating into her right buttock and right lower extremity. She also reports left foot pain ( Metatarsal), she refuses X-[place], she reports she will F/U with podiatry, had the same pain years ago in her right foot. She rates her pain 4. Her current exercise regime is walking and performing stretching exercises.  Mr.Lehner Morphine equivalent is 120.00 MME. She is also prescribed Clonazepam. We have discussed the black box warning of using opioids and benzodiazepines. I highlighted the dangers of using these drugs together and discussed the adverse events including respiratory suppression, overdose, cognitive impairment and importance of compliance with current regimen. We will continue to monitor and adjust as indicated.   UDS ordered today.    Pain Inventory Average Pain 4 Pain Right Now 4 My pain is intermittent, sharp, stabbing, and aching  In the last 24 hours, has pain interfered with the following? General activity 6 Relation with others 6 Enjoyment of life 7 What TIME of day is your pain at its worst? daytime Sleep (in general) Good  Pain is worse with: walking, sitting, and standing Pain improves with: rest, heat/ice, and medication Relief from Meds: 5  Family History  Problem Relation Age of Onset   Diabetes Mother    Heart disease Mother    Stroke Mother    Heart disease Father    Heart disease Brother    Social History   Socioeconomic History   Marital status: Widowed    Spouse name: Not on file   Number of children: Not on file   Years of education: Not on file   Highest education level: Not on file  Occupational History   Not on file  Tobacco Use   Smoking status: Former    Packs/day: 0.50    Years: 5.00     Pack years: 2.50    Types: Cigarettes    Quit date: 03/15/1968    Years since quitting: 52.6   Smokeless tobacco: Never  Vaping Use   Vaping Use: Never used  Substance and Sexual Activity   Alcohol use: Not Currently    Alcohol/week: 1.0 standard drink    Types: 1 Glasses of wine per week    Comment: occasional glass of wine   Drug use: No   Sexual activity: Not on file  Other Topics Concern   Not on file  Social History Narrative   Not on file   Social Determinants of Health   Financial Resource Strain: Not on file  Food Insecurity: Not on file  Transportation Needs: Not on file  Physical Activity: Not on file  Stress: Not on file  Social Connections: Not on file   Past Surgical History:  Procedure Laterality Date   [place]     x2   RETINAL DETACHMENT SURGERY  9/13   TRANSFORAMINAL LUMBAR INTERBODY FUSION W/ MIS 1 LEVEL Right 04/26/2018   Procedure: Right Lumbar four-five Minimally invasive Transforaminal lumbar interbody fusion;  Surgeon: Judith Part, MD;  Location: [facility];  Service: Neurosurgery;  Laterality: Right;   Past Surgical History:  Procedure Laterality  Date   ABDOMINAL HYSTERECTOMY     BACK SURGERY     2011   BLADDER SURGERY  1985   CESAREAN SECTION     x2   RETINAL DETACHMENT SURGERY  9/13   TRANSFORAMINAL LUMBAR INTERBODY FUSION W/ MIS 1 LEVEL Right 04/26/2018   Procedure: Right Lumbar four-five Minimally invasive Transforaminal lumbar interbody fusion;  Surgeon: Judith Part, MD;  Location: [facility];  Service: Neurosurgery;  Laterality: Right;   Past Medical History:  Diagnosis Date   Cervical facet syndrome    Depression    Disorders of sacrum    Enthesopathy of hip region    GERD (gastroesophageal reflux disease)    Headache    History of kidney stones    Hypertension    Sciatic neuritis    Synovitis and tenosynovitis    Vision abnormalities     BP 109/72   Pulse 73   Temp 98.8 F (37.1 C)   Ht '5\' 3"'$  (1.6 m)   Wt 140 lb 9.6 oz (63.8 kg)   SpO2 96%   BMI 24.91 kg/m   Opioid Risk Score:   Fall Risk Score:  `1  Depression screen PHQ 2/9  Depression screen [facility] 2/9 07/24/2020 05/09/2020 09/15/2019 01/25/2018 10/12/2017 06/03/2017 04/08/2017  Decreased Interest 0 0 0 0 0 0 0  Down, Depressed, Hopeless 0 0 0 0 0 0 0  PHQ - 2 Score 0 0 0 0 0 0 0  Altered sleeping - - - - - 0 -  Tired, decreased energy - - - - - 0 -  Change in appetite - - - - - 0 -  Feeling bad or failure about yourself  - - - - - 0 -  Trouble concentrating - - - - - 0 -  Moving slowly or fidgety/restless - - - - - 0 -  Suicidal thoughts - - - - - 0 -  PHQ-9 Score - - - - - 0 -  Some recent data might be hidden    Review of Systems  Musculoskeletal:  Positive for back pain.       Right back pain & buttock pain, left foot pain & right leg pain  All other systems reviewed and are negative.     Objective:   Physical Exam Vitals and nursing note reviewed.  Constitutional:      Appearance: Normal appearance.  Cardiovascular:     Rate and Rhythm: Normal rate and regular rhythm.     Pulses: Normal pulses.     Heart sounds: Normal heart sounds.  Pulmonary:     Effort: Pulmonary effort is normal.     Breath sounds: Normal breath sounds.  Musculoskeletal:     Cervical back: Normal range of motion and neck supple.     Comments: Normal Muscle Bulk and Muscle Testing Reveals:  Upper Extremities: Full ROM and Muscle Strength 5/5 Lumbar Hypersensitivity ( Mainly Right Side) Lower Extremities: Full ROM and Muscle Strength 5/5 Left Foot with tenderness with Palpation Arises from Table with ease Narrow Based  Gait     Skin:    General: Skin is warm and dry.  Neurological:     Mental Status: She is alert and oriented to person, place, and time.  Psychiatric:        Mood and Affect: Mood normal.        Behavior: Behavior normal.          Assessment &  Plan:  1. Facet Arthropathy/ Sacroiliac Joint  Dysfunction: 10/17/2020. Refilled: Fentanyl 25 mcg patch  # 10- one patch every 3 days and   Percocet 10/325 mg one every 6 hours as needed  # 110,  Second script  given for the following month.  Continue to Monitor. . We will continue the opioid monitoring program, this consists of regular clinic visits, examinations, urine drug screen, pill counts as well as use of [place] Controlled Substance Reporting system. A 12 month History has been reviewed on the [place] on 10/17/2020.  2. Lumbar Radicular  Pain: Continue HEP as Tolerated. Continue to Monitor. Continue Gabapentin and Cymbalta. 10/17/2020 3. Gait Disorder: Continue HEP / Neurology Following. 10/17/2020 4. Myoclonus with sleep: Continue Klonopin. Continue current medication regimen. Continue to Monitor. 10/17/2020     F/U in 2 months

## 2020-10-18 DIAGNOSIS — G894 Chronic pain syndrome: Secondary | ICD-10-CM | POA: Diagnosis not present

## 2020-10-18 DIAGNOSIS — Z5181 Encounter for therapeutic drug level monitoring: Secondary | ICD-10-CM | POA: Diagnosis not present

## 2020-10-18 DIAGNOSIS — Z79891 Long term (current) use of opiate analgesic: Secondary | ICD-10-CM | POA: Diagnosis not present

## 2020-10-23 ENCOUNTER — Telehealth: Payer: Self-pay | Admitting: *Deleted

## 2020-10-23 LAB — TOXASSURE SELECT,+ANTIDEPR,UR

## 2020-10-23 NOTE — Telephone Encounter (Signed)
Urine drug screen for this encounter is consistent for prescribed medication 

## 2020-11-09 DIAGNOSIS — H353131 Nonexudative age-related macular degeneration, bilateral, early dry stage: Secondary | ICD-10-CM | POA: Diagnosis not present

## 2020-11-09 DIAGNOSIS — H532 Diplopia: Secondary | ICD-10-CM | POA: Diagnosis not present

## 2020-11-09 DIAGNOSIS — H40023 Open angle with borderline findings, high risk, bilateral: Secondary | ICD-10-CM | POA: Diagnosis not present

## 2020-11-09 DIAGNOSIS — H35373 Puckering of macula, bilateral: Secondary | ICD-10-CM | POA: Diagnosis not present

## 2020-11-23 DIAGNOSIS — E782 Mixed hyperlipidemia: Secondary | ICD-10-CM | POA: Diagnosis not present

## 2020-11-23 DIAGNOSIS — M810 Age-related osteoporosis without current pathological fracture: Secondary | ICD-10-CM | POA: Diagnosis not present

## 2020-11-23 DIAGNOSIS — I10 Essential (primary) hypertension: Secondary | ICD-10-CM | POA: Diagnosis not present

## 2020-11-24 ENCOUNTER — Other Ambulatory Visit: Payer: Self-pay

## 2020-11-24 DIAGNOSIS — M47816 Spondylosis without myelopathy or radiculopathy, lumbar region: Secondary | ICD-10-CM

## 2020-11-24 DIAGNOSIS — M533 Sacrococcygeal disorders, not elsewhere classified: Secondary | ICD-10-CM

## 2020-11-24 DIAGNOSIS — M961 Postlaminectomy syndrome, not elsewhere classified: Secondary | ICD-10-CM

## 2020-11-29 IMAGING — DX DG LUMBAR SPINE COMPLETE 4+V
5 series · 5 of 5 positions shown · non-contrast
Comparison: Radiographs March 09, 2017. MRI of March 19, 2017.

CLINICAL DATA: Lower back and right leg pain after fall yesterday.

EXAM:
LUMBAR SPINE - COMPLETE 4+ VIEW

[l-spine ap]
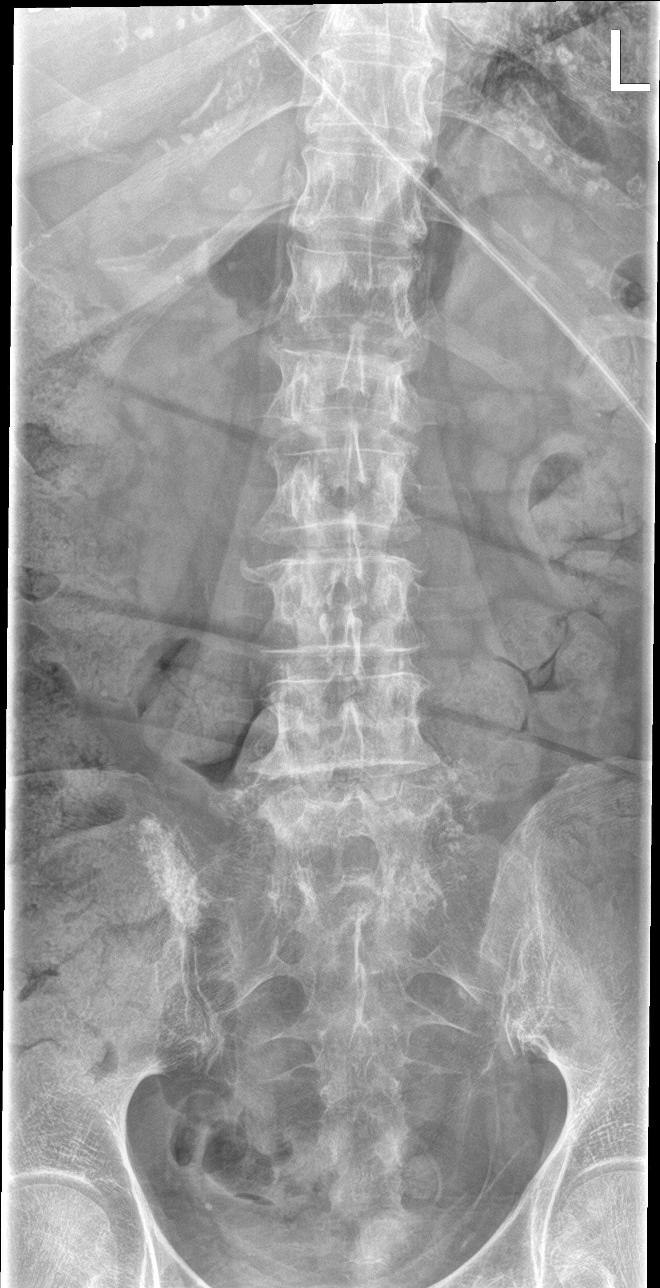

[l-spine obl (1 of 2)]
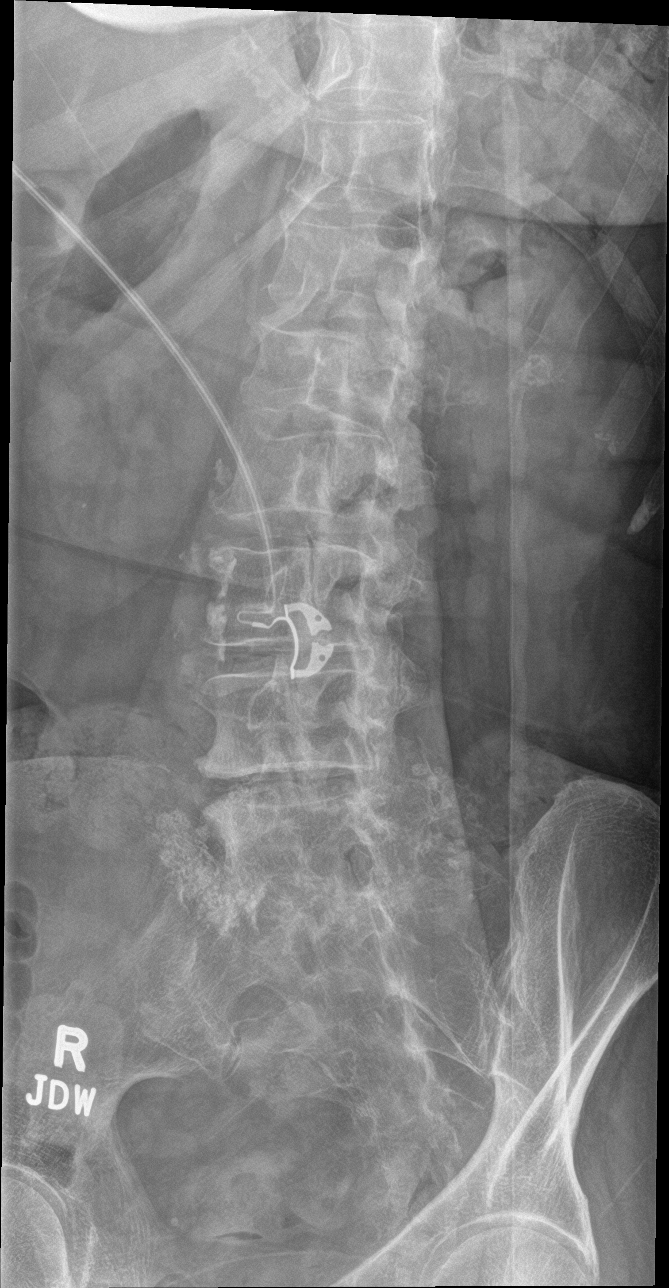

[l-spine obl (2 of 2)]
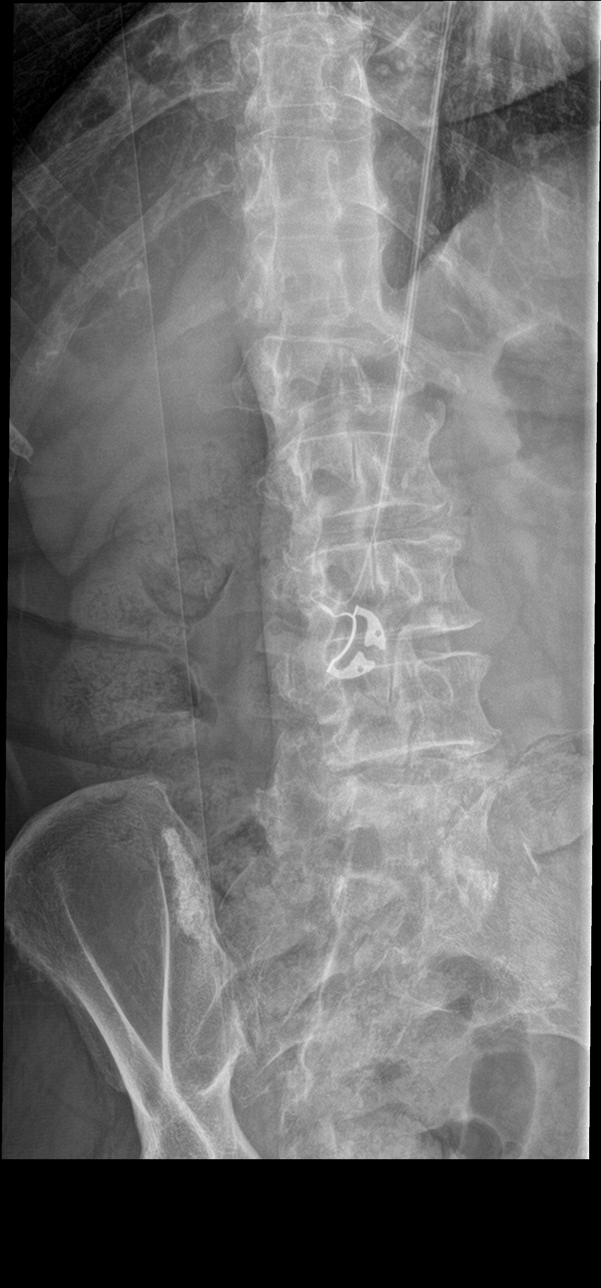

[l-spine lat]
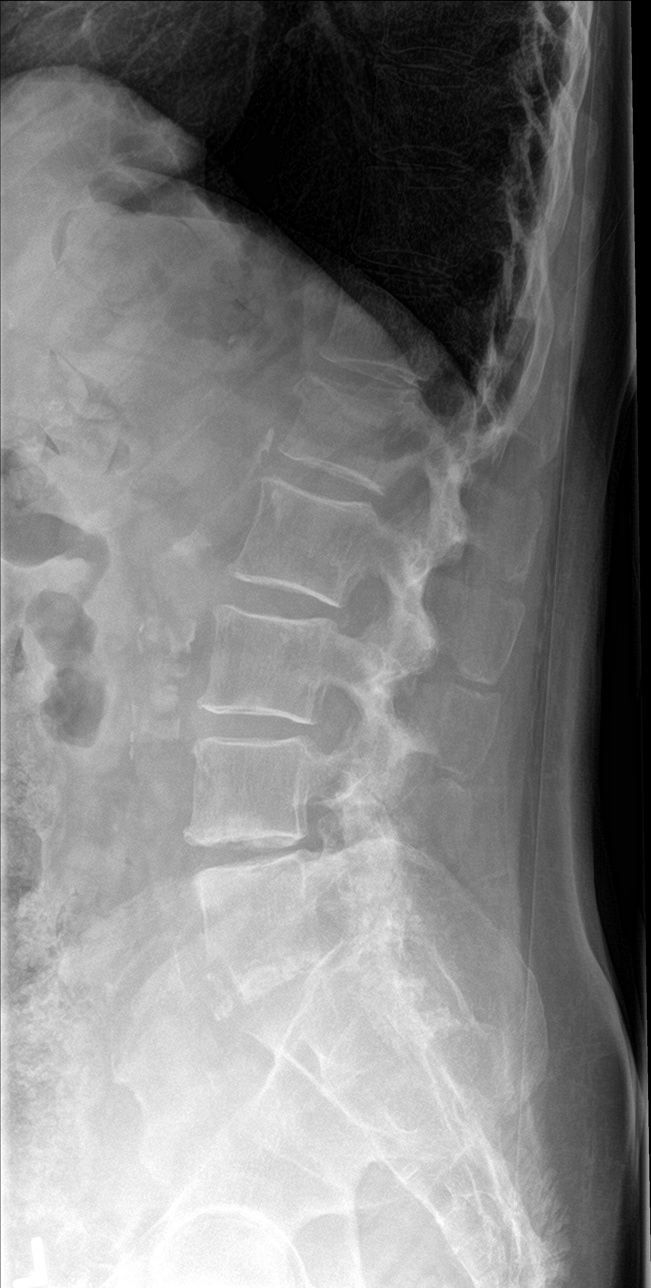

[l-spine spot]
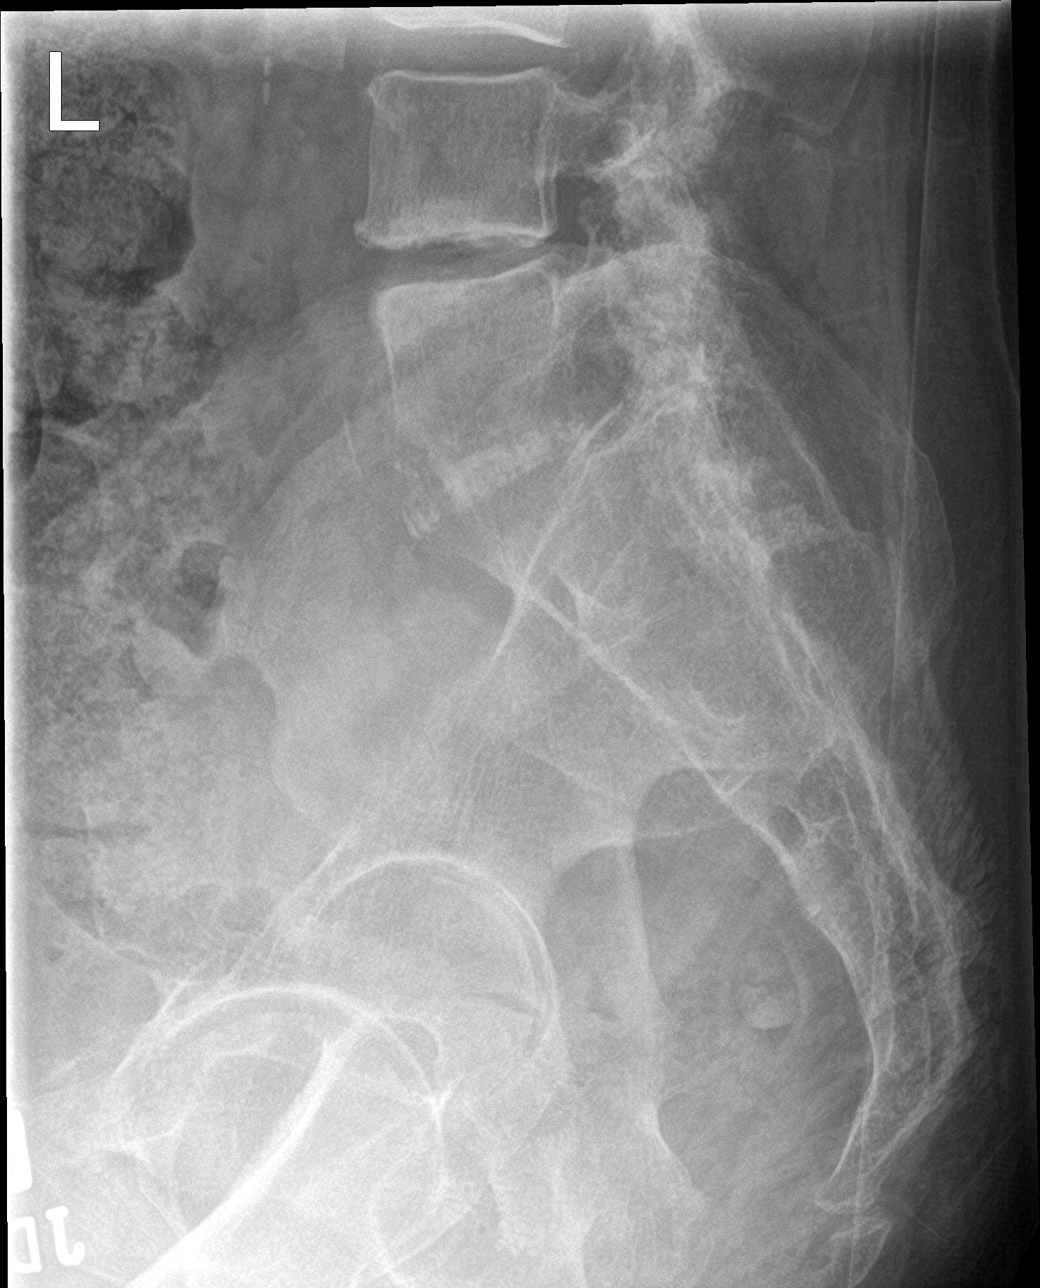

[5 of 5 positions shown; findings below may reference images not displayed]

FINDINGS: New mild wedge compression deformity of L1 vertebral body is noted
concerning for acute fracture. Mild degenerative disc disease is
noted at L4-5. There appears to be fusion of L5-S1. No
spondylolisthesis is noted. Atherosclerosis of abdominal aorta is
noted.
IMPRESSION: New mild wedge compression deformity of L1 vertebral body is noted
concerning for possible acute fracture. MRI may be performed further
evaluation.

Aortic Atherosclerosis ([id number]-USL.[id number]).

## 2020-12-10 ENCOUNTER — Other Ambulatory Visit: Payer: Self-pay | Admitting: Registered Nurse

## 2020-12-10 DIAGNOSIS — M961 Postlaminectomy syndrome, not elsewhere classified: Secondary | ICD-10-CM

## 2020-12-10 DIAGNOSIS — M5416 Radiculopathy, lumbar region: Secondary | ICD-10-CM

## 2020-12-13 ENCOUNTER — Encounter: Payer: Self-pay | Admitting: Registered Nurse

## 2020-12-13 ENCOUNTER — Other Ambulatory Visit: Payer: Self-pay

## 2020-12-13 ENCOUNTER — Ambulatory Visit
Admission: RE | Admit: 2020-12-13 | Discharge: 2020-12-13 | Disposition: A | Discharge status: Home / Self Care | Payer: Medicare Other | Source: Ambulatory Visit | Attending: Registered Nurse | Admitting: Registered Nurse

## 2020-12-13 ENCOUNTER — Encounter: Payer: Medicare Other | Attending: Physical Medicine and Rehabilitation | Admitting: Registered Nurse

## 2020-12-13 VITALS — BP 122/80 | HR 95 | Ht 63.0 in | Wt 143.2 lb

## 2020-12-13 DIAGNOSIS — Z5181 Encounter for therapeutic drug level monitoring: Secondary | ICD-10-CM | POA: Insufficient documentation

## 2020-12-13 DIAGNOSIS — R2689 Other abnormalities of gait and mobility: Secondary | ICD-10-CM | POA: Diagnosis not present

## 2020-12-13 DIAGNOSIS — G8929 Other chronic pain: Secondary | ICD-10-CM | POA: Insufficient documentation

## 2020-12-13 DIAGNOSIS — Z79891 Long term (current) use of opiate analgesic: Secondary | ICD-10-CM | POA: Insufficient documentation

## 2020-12-13 DIAGNOSIS — M533 Sacrococcygeal disorders, not elsewhere classified: Secondary | ICD-10-CM | POA: Diagnosis not present

## 2020-12-13 DIAGNOSIS — R269 Unspecified abnormalities of gait and mobility: Secondary | ICD-10-CM | POA: Insufficient documentation

## 2020-12-13 DIAGNOSIS — G894 Chronic pain syndrome: Secondary | ICD-10-CM | POA: Diagnosis not present

## 2020-12-13 DIAGNOSIS — M961 Postlaminectomy syndrome, not elsewhere classified: Secondary | ICD-10-CM | POA: Diagnosis not present

## 2020-12-13 DIAGNOSIS — M545 Low back pain, unspecified: Secondary | ICD-10-CM | POA: Insufficient documentation

## 2020-12-13 DIAGNOSIS — M7522 Bicipital tendinitis, left shoulder: Secondary | ICD-10-CM | POA: Insufficient documentation

## 2020-12-13 DIAGNOSIS — M47816 Spondylosis without myelopathy or radiculopathy, lumbar region: Secondary | ICD-10-CM | POA: Diagnosis not present

## 2020-12-13 MED ORDER — OXYCODONE-ACETAMINOPHEN 10-325 MG PO TABS: 1.0000 | ORAL_TABLET | Freq: Four times a day (QID) | ORAL | 0 refills | Status: DC | PRN

## 2020-12-13 MED ORDER — FENTANYL 25 MCG/HR TD PT72: 1.0000 | MEDICATED_PATCH | TRANSDERMAL | 0 refills | Status: DC

## 2020-12-13 NOTE — Progress Notes (Signed)
Subjective:    Patient ID: Alexandria Mclaughlin, female    DOB: 03/13/1940, 80 y.o.   MRN: [id number]  HPI: Alexandria Mclaughlin is a 80 y.o. female who returns for follow up appointment for chronic pain and medication refill. She states her pain is located in her lower back reports increase intensity of lower back pain. We will order a X-[place] she verbalizes understanding. . She states she was prescribe meclizine for her dizziness by her PCP, she is scheduled for Physical therapy evaluation.  Alexandria Mclaughlin reports she fell last week, she was walking and became dizzy and fell on her right side, she wasn't using any assistive device walkeror cane. her son helped her up she reports. She states she was prescribe meclizine for her dizziness by her PCP, she is scheduled for Physical therapy evaluation. Educated on Falls Prevention she verbalizes understanding.   Alexandria Mclaughlin states " there are times when I'm  walking she feels as though her walking speeds up and she is unable to stop on her own, she has to grab a wall or chair. We will place a neurology consult. She verbalizes understanding.  She rates her pain 4. Her current exercise regime is walking. Alexandria Mclaughlin Morphine equivalent is 121.11 MME.She  is also prescribed Clonazepam . We have discussed the black box warning of using opioids and benzodiazepines. I highlighted the dangers of using these drugs together and discussed the adverse events including respiratory suppression, overdose, cognitive impairment and importance of compliance with current regimen. We will continue to monitor and adjust as indicated.   Last UDS was Performed on 10/18/2020, it was consistent.       Pain Inventory Average Pain 4 Pain Right Now 4 My pain is intermittent, sharp, and stabbing  In the last 24 hours, has pain interfered with the following? General activity 6 Relation with others 6 Enjoyment of life 6 What TIME of day is your pain at its worst? daytime Sleep (in general) Good  Pain is  worse with: walking, bending, sitting, and standing Pain improves with: rest, heat/ice, and medication Relief from Meds: 6  Family History  Problem Relation Age of Onset   Diabetes Mother    Heart disease Mother    Stroke Mother    Heart disease Father    Heart disease Brother    Social History   Socioeconomic History   Marital status: Widowed    Spouse name: Not on file   Number of children: Not on file   Years of education: Not on file   Highest education level: Not on file  Occupational History   Not on file  Tobacco Use   Smoking status: Former    Packs/day: 0.50    Years: 5.00    Pack years: 2.50    Types: Cigarettes    Quit date: 03/15/1968    Years since quitting: 52.7   Smokeless tobacco: Never  Vaping Use   Vaping Use: Never used  Substance and Sexual Activity   Alcohol use: Not Currently    Alcohol/week: 1.0 standard drink    Types: 1 Glasses of wine per week    Comment: occasional glass of wine   Drug use: No   Sexual activity: Not on file  Other Topics Concern   Not on file  Social History Narrative   Not on file   Social Determinants of Health   Financial Resource Strain: Not on file  Food Insecurity: Not on file  Transportation Needs: Not on  file  Physical Activity: Not on file  Stress: Not on file  Social Connections: Not on file   Past Surgical History:  Procedure Laterality Date   ABDOMINAL HYSTERECTOMY     [place]     x2   RETINAL DETACHMENT SURGERY  9/13   TRANSFORAMINAL LUMBAR INTERBODY FUSION W/ MIS 1 LEVEL Right 04/26/2018   Procedure: Right Lumbar four-five Minimally invasive Transforaminal lumbar interbody fusion;  Surgeon: Judith Part, MD;  Location: [place];  Service: Neurosurgery;  Laterality: Right;   Past Surgical History:  Procedure Laterality Date   ABDOMINAL HYSTERECTOMY     BACK SURGERY     2011   [place]     x2    RETINAL DETACHMENT SURGERY  9/13   TRANSFORAMINAL LUMBAR INTERBODY FUSION W/ MIS 1 LEVEL Right 04/26/2018   Procedure: Right Lumbar four-five Minimally invasive Transforaminal lumbar interbody fusion;  Surgeon: Judith Part, MD;  Location: [facility];  Service: Neurosurgery;  Laterality: Right;   Past Medical History:  Diagnosis Date   Cervical facet syndrome    Depression    Disorders of sacrum    Enthesopathy of hip region    GERD (gastroesophageal reflux disease)    Headache    History of kidney stones    Hypertension    Sciatic neuritis    Synovitis and tenosynovitis    Vision abnormalities    There were no vitals taken for this visit.  Opioid Risk Score:   Fall Risk Score:  `1  Depression screen PHQ 2/9  Depression screen [facility] 2/9 10/17/2020 07/24/2020 05/09/2020 09/15/2019 01/25/2018 10/12/2017 06/03/2017  Decreased Interest 0 0 0 0 0 0 0  Down, Depressed, Hopeless 0 0 0 0 0 0 0  PHQ - 2 Score 0 0 0 0 0 0 0  Altered sleeping - - - - - - 0  Tired, decreased energy - - - - - - 0  Change in appetite - - - - - - 0  Feeling bad or failure about yourself  - - - - - - 0  Trouble concentrating - - - - - - 0  Moving slowly or fidgety/restless - - - - - - 0  Suicidal thoughts - - - - - - 0  PHQ-9 Score - - - - - - 0  Some recent data might be hidden    Review of Systems  Musculoskeletal:  Positive for back pain and gait problem.       Left foot, right leg pain  All other systems reviewed and are negative.     Objective:   Physical Exam Vitals and nursing note reviewed.  Constitutional:      Appearance: Normal appearance.  Cardiovascular:     Rate and Rhythm: Normal rate and regular rhythm.     Pulses: Normal pulses.     Heart sounds: Normal heart sounds.  Pulmonary:     Effort: Pulmonary effort is normal.     Breath sounds: Normal breath sounds.  Musculoskeletal:     Cervical back: Normal range of motion and neck supple.     Comments: Normal Muscle Bulk and Muscle  Testing Reveals:  Upper Extremities: Full ROM and Muscle Strength  5/5  Lumbar Paraspinal Tenderness: L-3-L-5 Lower Extremities: Full ROM and Muscle Strength 5/5 Arises from Table Slowly using cane for support Antalgic  Gait  Skin:    General: Skin is warm and dry.  Neurological:     Mental Status: She is alert and oriented to person, place, and time.  Psychiatric:        Mood and Affect: Mood normal.        Behavior: Behavior normal.         Assessment & Plan:  Acute Exacerbation of Chronic Low Back Pain: RX: Lumbar X-[place] 2. Facet Arthropathy/ Sacroiliac Joint Dysfunction: 12/13/2020. Refilled: Fentanyl 25 mcg patch  # 10- one patch every 3 days and   Percocet 10/325 mg one every 6 hours as needed  # 110,  Second script  given for the following month.  Continue to Monitor. .12/13/2020 We will continue the opioid monitoring program, this consists of regular clinic visits, examinations, urine drug screen, pill counts as well as use of [place] Controlled Substance Reporting system. A 12 month History has been reviewed on the [place] on 12/13/2020. 3. Lumbar Radicular  Pain: Continue HEP as Tolerated. Continue to Monitor. Continue Gabapentin and Cymbalta. 12/13/2020 4. Gait Disorder: Continue Physical Therapy and HEP as tolerated. Neurology Referral Placed. 12/13/2020 4. Myoclonus with sleep: Continue Klonopin. Continue current medication regimen. Continue to Monitor. 12/13/2020     F/U in 2 months

## 2020-12-17 DIAGNOSIS — M21612 Bunion of left foot: Secondary | ICD-10-CM | POA: Diagnosis not present

## 2020-12-17 DIAGNOSIS — M2022 Hallux rigidus, left foot: Secondary | ICD-10-CM | POA: Diagnosis not present

## 2020-12-17 DIAGNOSIS — M21619 Bunion of unspecified foot: Secondary | ICD-10-CM | POA: Insufficient documentation

## 2020-12-17 HISTORY — DX: Hallux rigidus, left foot: M20.22

## 2020-12-17 HISTORY — DX: Bunion of unspecified foot: M21.619

## 2020-12-18 ENCOUNTER — Telehealth: Payer: Self-pay | Admitting: Registered Nurse

## 2020-12-18 ENCOUNTER — Encounter: Payer: Self-pay | Admitting: Neurology

## 2020-12-18 DIAGNOSIS — G894 Chronic pain syndrome: Secondary | ICD-10-CM | POA: Diagnosis not present

## 2020-12-18 DIAGNOSIS — R2681 Unsteadiness on feet: Secondary | ICD-10-CM | POA: Diagnosis not present

## 2020-12-18 DIAGNOSIS — R42 Dizziness and giddiness: Secondary | ICD-10-CM | POA: Diagnosis not present

## 2020-12-18 NOTE — Telephone Encounter (Signed)
Placed a call to Ms. Doukas,  Reviewed Lumbar X-[place] with Ms. Pugsley, all questions answered.  She has a scheduled appointment with Physical Therapy she reports, PCP placed the order.  She has a scheduled neurology appointment with Dr Tomi Likens.  She will keep this provider updated  on the above, she verbalizes understanding.

## 2020-12-24 DIAGNOSIS — E782 Mixed hyperlipidemia: Secondary | ICD-10-CM | POA: Diagnosis not present

## 2020-12-24 DIAGNOSIS — I10 Essential (primary) hypertension: Secondary | ICD-10-CM | POA: Diagnosis not present

## 2020-12-24 DIAGNOSIS — M810 Age-related osteoporosis without current pathological fracture: Secondary | ICD-10-CM | POA: Diagnosis not present

## 2020-12-26 DIAGNOSIS — M6281 Muscle weakness (generalized): Secondary | ICD-10-CM | POA: Diagnosis not present

## 2020-12-26 DIAGNOSIS — R260 Ataxic gait: Secondary | ICD-10-CM | POA: Diagnosis not present

## 2020-12-26 DIAGNOSIS — R262 Difficulty in walking, not elsewhere classified: Secondary | ICD-10-CM | POA: Diagnosis not present

## 2020-12-27 ENCOUNTER — Inpatient Hospital Stay (HOSPITAL_COMMUNITY)
Admission: EM | Admit: 2020-12-27 | Discharge: 2021-01-01 | DRG: 551 | Disposition: A | Discharge status: Skilled Nursing Facility | Payer: Medicare Other | Attending: Internal Medicine | Admitting: Internal Medicine

## 2020-12-27 ENCOUNTER — Emergency Department (HOSPITAL_COMMUNITY): Payer: Medicare Other

## 2020-12-27 ENCOUNTER — Encounter (HOSPITAL_COMMUNITY): Payer: Self-pay

## 2020-12-27 DIAGNOSIS — Z90710 Acquired absence of both cervix and uterus: Secondary | ICD-10-CM

## 2020-12-27 DIAGNOSIS — S32010A Wedge compression fracture of first lumbar vertebra, initial encounter for closed fracture: Secondary | ICD-10-CM | POA: Diagnosis not present

## 2020-12-27 DIAGNOSIS — M533 Sacrococcygeal disorders, not elsewhere classified: Secondary | ICD-10-CM

## 2020-12-27 DIAGNOSIS — I6523 Occlusion and stenosis of bilateral carotid arteries: Secondary | ICD-10-CM | POA: Diagnosis not present

## 2020-12-27 DIAGNOSIS — Z20822 Contact with and (suspected) exposure to covid-19: Secondary | ICD-10-CM | POA: Diagnosis not present

## 2020-12-27 DIAGNOSIS — Z79899 Other long term (current) drug therapy: Secondary | ICD-10-CM

## 2020-12-27 DIAGNOSIS — G9341 Metabolic encephalopathy: Secondary | ICD-10-CM | POA: Diagnosis present

## 2020-12-27 DIAGNOSIS — I10 Essential (primary) hypertension: Secondary | ICD-10-CM | POA: Diagnosis present

## 2020-12-27 DIAGNOSIS — M47812 Spondylosis without myelopathy or radiculopathy, cervical region: Secondary | ICD-10-CM | POA: Diagnosis not present

## 2020-12-27 DIAGNOSIS — M5136 Other intervertebral disc degeneration, lumbar region: Secondary | ICD-10-CM | POA: Diagnosis present

## 2020-12-27 DIAGNOSIS — F419 Anxiety disorder, unspecified: Secondary | ICD-10-CM | POA: Diagnosis present

## 2020-12-27 DIAGNOSIS — Z79891 Long term (current) use of opiate analgesic: Secondary | ICD-10-CM

## 2020-12-27 DIAGNOSIS — Z87891 Personal history of nicotine dependence: Secondary | ICD-10-CM | POA: Diagnosis not present

## 2020-12-27 DIAGNOSIS — R29898 Other symptoms and signs involving the musculoskeletal system: Secondary | ICD-10-CM

## 2020-12-27 DIAGNOSIS — M48061 Spinal stenosis, lumbar region without neurogenic claudication: Secondary | ICD-10-CM | POA: Diagnosis not present

## 2020-12-27 DIAGNOSIS — R4781 Slurred speech: Secondary | ICD-10-CM | POA: Diagnosis not present

## 2020-12-27 DIAGNOSIS — Z743 Need for continuous supervision: Secondary | ICD-10-CM | POA: Diagnosis not present

## 2020-12-27 DIAGNOSIS — F32A Depression, unspecified: Secondary | ICD-10-CM | POA: Diagnosis not present

## 2020-12-27 DIAGNOSIS — G253 Myoclonus: Secondary | ICD-10-CM

## 2020-12-27 DIAGNOSIS — Z981 Arthrodesis status: Secondary | ICD-10-CM

## 2020-12-27 DIAGNOSIS — R29818 Other symptoms and signs involving the nervous system: Secondary | ICD-10-CM | POA: Diagnosis not present

## 2020-12-27 DIAGNOSIS — R0902 Hypoxemia: Secondary | ICD-10-CM | POA: Diagnosis not present

## 2020-12-27 DIAGNOSIS — M6281 Muscle weakness (generalized): Secondary | ICD-10-CM | POA: Diagnosis not present

## 2020-12-27 DIAGNOSIS — Z8249 Family history of ischemic heart disease and other diseases of the circulatory system: Secondary | ICD-10-CM

## 2020-12-27 DIAGNOSIS — R531 Weakness: Secondary | ICD-10-CM | POA: Diagnosis not present

## 2020-12-27 DIAGNOSIS — M7522 Bicipital tendinitis, left shoulder: Secondary | ICD-10-CM

## 2020-12-27 DIAGNOSIS — R4182 Altered mental status, unspecified: Secondary | ICD-10-CM | POA: Diagnosis not present

## 2020-12-27 DIAGNOSIS — G8191 Hemiplegia, unspecified affecting right dominant side: Secondary | ICD-10-CM | POA: Diagnosis not present

## 2020-12-27 DIAGNOSIS — G894 Chronic pain syndrome: Secondary | ICD-10-CM | POA: Diagnosis not present

## 2020-12-27 DIAGNOSIS — G459 Transient cerebral ischemic attack, unspecified: Secondary | ICD-10-CM | POA: Diagnosis not present

## 2020-12-27 DIAGNOSIS — J3489 Other specified disorders of nose and nasal sinuses: Secondary | ICD-10-CM | POA: Diagnosis not present

## 2020-12-27 DIAGNOSIS — M961 Postlaminectomy syndrome, not elsewhere classified: Secondary | ICD-10-CM

## 2020-12-27 DIAGNOSIS — R262 Difficulty in walking, not elsewhere classified: Secondary | ICD-10-CM | POA: Diagnosis not present

## 2020-12-27 DIAGNOSIS — M5137 Other intervertebral disc degeneration, lumbosacral region: Secondary | ICD-10-CM | POA: Diagnosis not present

## 2020-12-27 DIAGNOSIS — M47816 Spondylosis without myelopathy or radiculopathy, lumbar region: Secondary | ICD-10-CM | POA: Diagnosis not present

## 2020-12-27 DIAGNOSIS — Z87442 Personal history of urinary calculi: Secondary | ICD-10-CM | POA: Diagnosis not present

## 2020-12-27 DIAGNOSIS — K219 Gastro-esophageal reflux disease without esophagitis: Secondary | ICD-10-CM | POA: Diagnosis present

## 2020-12-27 DIAGNOSIS — E785 Hyperlipidemia, unspecified: Secondary | ICD-10-CM | POA: Diagnosis present

## 2020-12-27 DIAGNOSIS — R2689 Other abnormalities of gait and mobility: Secondary | ICD-10-CM | POA: Diagnosis not present

## 2020-12-27 DIAGNOSIS — I6782 Cerebral ischemia: Secondary | ICD-10-CM | POA: Diagnosis not present

## 2020-12-27 LAB — CBC
HCT: 43.6 % (ref 36.0–46.0)
Hemoglobin: 13.9 g/dL (ref 12.0–15.0)
MCH: 29.8 pg (ref 26.0–34.0)
MCHC: 31.9 g/dL (ref 30.0–36.0)
MCV: 93.6 fL (ref 80.0–100.0)
Platelets: 193 10*3/uL (ref 150–400)
RBC: 4.66 MIL/uL (ref 3.87–5.11)
RDW: 12.7 % (ref 11.5–15.5)
WBC: 8.5 10*3/uL (ref 4.0–10.5)
nRBC: 0 % (ref 0.0–0.2)

## 2020-12-27 LAB — COMPREHENSIVE METABOLIC PANEL
ALT: 18 U/L (ref 0–44)
AST: 24 U/L (ref 15–41)
Albumin: 3.8 g/dL (ref 3.5–5.0)
Alkaline Phosphatase: 64 U/L (ref 38–126)
Anion gap: 5 (ref 5–15)
BUN: 17 mg/dL (ref 8–23)
CO2: 30 mmol/L (ref 22–32)
Calcium: 9.7 mg/dL (ref 8.9–10.3)
Chloride: 105 mmol/L (ref 98–111)
Creatinine, Ser: 0.6 mg/dL (ref 0.44–1.00)
GFR, Estimated: >60 mL/min (ref >60)
Glucose, Bld: 108 mg/dL — ABNORMAL HIGH (ref 70–99)
Potassium: 4.3 mmol/L (ref 3.5–5.1)
Sodium: 140 mmol/L (ref 135–145)
Total Bilirubin: 0.6 mg/dL (ref 0.3–1.2)
Total Protein: 6.6 g/dL (ref 6.5–8.1)

## 2020-12-27 LAB — CBG MONITORING, ED: Glucose-Capillary: 110 mg/dL — ABNORMAL HIGH (ref 70–99)

## 2020-12-27 MED ORDER — PROCHLORPERAZINE EDISYLATE 10 MG/2ML IJ SOLN
10.0000 mg | Freq: Once | INTRAMUSCULAR | Status: AC
  Administered 2020-12-27: 10 mg via INTRAVENOUS
  Filled 2020-12-27: qty 2

## 2020-12-27 MED ORDER — LACTATED RINGERS IV BOLUS
1000.0000 mL | Freq: Once | INTRAVENOUS | Status: AC
  Administered 2020-12-27: 1000 mL via INTRAVENOUS

## 2020-12-27 MED ORDER — DIPHENHYDRAMINE HCL 50 MG/ML IJ SOLN
25.0000 mg | Freq: Once | INTRAMUSCULAR | Status: AC
  Administered 2020-12-27: 25 mg via INTRAVENOUS
  Filled 2020-12-27: qty 1

## 2020-12-27 NOTE — ED Provider Notes (Signed)
Emergency Medicine Provider Triage Evaluation Note  Alexandria Mclaughlin , a 80 y.o. female  was evaluated in triage.  Pt complains of stuttered speech for approximately 3 hours.  Of note patient was at her boyfriend's house, and boyfriend was taken to the emergency department about 2 hours ago as a code stroke.  Patient states that she had sudden onset of slow and stuttered speech as well as generalized weakness.  In route EMS noted she had intermittent clear speech as well as the slow/stuttered speech.  Per patient she also has been taking "a couple" oxycodone as well as a fentanyl patch for her chronic back pain today.  Review of Systems  Positive: Stuttered speech, back pain, weakness Negative: Chest pain, shortness of breath  Physical Exam  BP (!) 157/70 (BP Location: Left Arm)   Pulse 64   Temp 98.6 F (37 C) (Oral)   Resp 16   SpO2 97%  Gen:   Awake, no distress   Resp:  Normal effort  MSK:   Moves extremities without difficulty  Other:  Stuttered speech on exam, ANO x4, PERRLA, EOMI, difficult to assess strength as patient has generalized tremors in all extremities  Medical Decision Making  Medically screening exam initiated at 3:46 PM.  Appropriate orders placed.  HUYEN PERAZZO was informed that the remainder of the evaluation will be completed by another provider, this initial triage assessment does not replace that evaluation, and the importance of remaining in the ED until their evaluation is complete.     Estill Cotta 12/27/20 1555    Carmin Muskrat, MD 12/28/20 [phone]

## 2020-12-27 NOTE — ED Provider Notes (Addendum)
[facility] EMERGENCY DEPARTMENT Provider Note   CSN: [id number] Arrival date & time: 12/27/20  1518     History Chief Complaint  Patient presents with   Altered Mental Status   Drug Overdose    Alexandria Mclaughlin is a 80 y.o. female.   Altered Mental Status Presenting symptoms: no confusion   Associated symptoms: headaches, light-headedness and weakness   Associated symptoms: no abdominal pain, no fever, no nausea, no palpitations, no rash, no seizures and no vomiting   Drug Overdose Associated symptoms include headaches. Pertinent negatives include no chest pain, no abdominal pain and no shortness of breath.   80 year old female with PMH significant for depression, HTN, prior TIA, and others as below who presents to the ED accompanied by her son with complaints of altered mental status.  Patient reports that approximately noon today, she had sudden onset of slurred speech, tremulousness, and numbness.  Occurred after her boyfriend had called 68, and was found to be having a stroke.  Patient reports that she has chronic RLE numbness in her foot, which is now extended up to the right knee.  She has also had a prolonged headache today since the incident.  Reports compliance on her home antihypertensive, which includes quinapril.  Patient deals with chronic back pain and is taking daily Percocet, gabapentin, Klonopin, and trazodone.  She denies any recent medication changes or drug/EtOH use.  On arrival, she continues to complain of 10/10 headache and back pain.  She also states she has had generalized increasing weakness over the past 2 months, which has progressed to inability to walk.  Her son is at bedside, who states that she is speaking slower than normal and that she is not normally tremulous.  Past Medical History:  Diagnosis Date   Cervical facet syndrome    Depression    Disorders of sacrum    Enthesopathy of hip region    GERD (gastroesophageal reflux disease)     Headache    History of kidney stones    Hypertension    Sciatic neuritis    Synovitis and tenosynovitis    Vision abnormalities     Patient Active Problem List   Diagnosis Date Noted   Biceps tendonitis on left 08/18/2018   Rotator cuff tendonitis, left 06/08/2018   Lumbar radiculopathy 04/26/2018   Lumbar radicular pain 07/29/2016   Myoclonus 09/19/2015   Therapeutic opioid induced constipation 07/25/2015   White matter changes 02/07/2015   Essential hypertension 02/07/2015   HLD (hyperlipidemia) 02/07/2015   Low back pain 02/07/2015   BPPV (benign paroxysmal positional vertigo) 02/07/2015   Abnormality of gait 02/07/2015   Lumbar facet arthropathy 07/05/2014   Acquired unequal leg length on left 08/08/2011   Lumbar post-laminectomy syndrome 06/11/2011   Sacroiliac joint dysfunction 06/11/2011    Past Surgical History:  Procedure Laterality Date   ABDOMINAL HYSTERECTOMY     BACK SURGERY     2011   [place]     x2   RETINAL DETACHMENT SURGERY  9/13   TRANSFORAMINAL LUMBAR INTERBODY FUSION W/ MIS 1 LEVEL Right 04/26/2018   Procedure: Right Lumbar four-five Minimally invasive Transforaminal lumbar interbody fusion;  Surgeon: Judith Part, MD;  Location: [place];  Service: Neurosurgery;  Laterality: Right;     OB History   No obstetric history on file.     Family History  Problem Relation Age of Onset   Diabetes Mother    Heart disease  Mother    Stroke Mother    Heart disease Father    Heart disease Brother     Social History   Tobacco Use   Smoking status: Former    Packs/day: 0.50    Years: 5.00    Pack years: 2.50    Types: Cigarettes    Quit date: 03/15/1968    Years since quitting: 52.8   Smokeless tobacco: Never  Vaping Use   Vaping Use: Never used  Substance Use Topics   Alcohol use: Not Currently    Alcohol/week: 1.0 standard drink    Types: 1 Glasses of wine per week    Comment: occasional glass of wine    Drug use: No    Home Medications Prior to Admission medications   Medication Sig Start Date End Date Taking? Authorizing Provider  Calcium Carb-Cholecalciferol (CALCIUM+D3 PO) 1 tablet 04/29/16   [provider]  clonazePAM (KLONOPIN) 0.5 MG tablet TAKE 1 TABLET BY MOUTH EVERY DAY AT BEDTIME AS NEEDED 07/24/20   Bayard Hugger, NP  [employer] MOTION SICKNESS RELIEF 25 MG CHEW Chew 1-2 tablets by mouth 3 (three) times daily. 12/03/20   [provider]  DULoxetine (CYMBALTA) 30 MG capsule TAKE 1 CAPSULE BY MOUTH EVERY DAY 08/08/20   Bayard Hugger, NP  fentaNYL (DURAGESIC) 25 MCG/HR Place 1 patch onto the skin every 3 (three) days. 12/13/20   Bayard Hugger, NP  gabapentin (NEURONTIN) 600 MG tablet TAKE 1 TABLET 4 TIMES A DAY 12/10/20   Bayard Hugger, NP  LAGEVRIO 200 MG CAPS SMARTSIG:4 Capsule(s) By Mouth Every 12 Hours 09/17/20   [provider]  Melatonin 5 MG CAPS Take 5 mg by mouth at bedtime as needed (sleep).    [provider]  Molnupiravir (LAGEVRIO) 200 MG CAPS 4 capsules 09/17/20   [provider]  molnupiravir EUA (LAGEVRIO) 200 MG CAPS capsule 4 capsules 09/17/20   [provider]  Multiple Vitamin (MULTIVITAMIN WITH MINERALS) TABS tablet Take 1 tablet by mouth daily.    [provider]  oxyCODONE-acetaminophen (PERCOCET) 10-325 MG tablet Take 1 tablet by mouth every 6 (six) hours as needed for pain. 12/13/20   Bayard Hugger, NP  Pseudoeph-Bromphen-DM (BROMFED DM PO) 5 ml as needed 09/17/20   [provider]  quinapril (ACCUPRIL) 20 MG tablet Take 20 mg by mouth daily.    [provider]  rosuvastatin (CRESTOR) 20 MG tablet Take 20 mg by mouth daily.    [provider]  traZODone (DESYREL) 100 MG tablet TAKE 1 TABLET BY MOUTH EVERYDAY AT BEDTIME 07/24/20   Meredith Staggers, MD  triamcinolone cream (KENALOG) 0.1 % 1 application 06/26/52   [provider]    Allergies    Patient has no  known allergies.  Review of Systems   Review of Systems  Constitutional:  Negative for activity change, appetite change, chills and fever.  HENT:  Negative for ear pain and sore throat.   Eyes:  Negative for pain and visual disturbance.  Respiratory:  Negative for cough and shortness of breath.   Cardiovascular:  Negative for chest pain and palpitations.  Gastrointestinal:  Negative for abdominal distention, abdominal pain, diarrhea, nausea and vomiting.  Musculoskeletal:  Positive for back pain and gait problem. Negative for arthralgias, neck pain and neck stiffness.  Skin:  Negative for color change, pallor and rash.  Neurological:  Positive for tremors, speech difficulty, weakness, light-headedness, numbness and headaches. Negative for dizziness, seizures and syncope.  Hematological:  Does not bruise/bleed easily.  Psychiatric/Behavioral:  Negative for confusion. The patient is nervous/anxious.   All other systems reviewed and are negative.  Physical Exam Updated Vital Signs BP (!) 214/95   Pulse 73   Temp 98.2 F (36.8 C) (Oral)   Resp 14   SpO2 100%   Physical Exam Vitals and nursing note reviewed.  Constitutional:      General: She is not in acute distress.    Appearance: Normal appearance. She is well-developed and normal weight. She is not ill-appearing, toxic-appearing or diaphoretic.  HENT:     Head: Normocephalic and atraumatic.     Right Ear: External ear normal.     Left Ear: External ear normal.     Nose: Nose normal.     Mouth/Throat:     Mouth: Mucous membranes are moist.     Pharynx: Oropharynx is clear.  Eyes:     General: No scleral icterus.    Extraocular Movements: Extraocular movements intact.     Conjunctiva/sclera: Conjunctivae normal.     Pupils: Pupils are equal, round, and reactive to light.  Cardiovascular:     Rate and Rhythm: Normal rate and regular rhythm.     Heart sounds: No murmur heard. Pulmonary:     Effort: Pulmonary effort is  normal. No respiratory distress.     Breath sounds: Normal breath sounds.  Abdominal:     General: There is no distension.     Palpations: Abdomen is soft.     Tenderness: There is no abdominal tenderness.  Musculoskeletal:        General: Normal range of motion.     Cervical back: Normal range of motion and neck supple. No rigidity or tenderness.     Right lower leg: No edema.     Left lower leg: No edema.  Lymphadenopathy:     Cervical: No cervical adenopathy.  Skin:    General: Skin is warm and dry.     Capillary Refill: Capillary refill takes less than 2 seconds.  Neurological:     Mental Status: She is alert and oriented to person, place, and time.     GCS: GCS eye subscore is 4. GCS verbal subscore is 5. GCS motor subscore is 6.     Cranial Nerves: Cranial nerves 2-12 are intact. No cranial nerve deficit, dysarthria or facial asymmetry.     Sensory: Sensory deficit present.     Motor: Weakness and tremor present. No atrophy, abnormal muscle tone or seizure activity.     Coordination: Coordination is intact. Finger-Nose-Finger Test normal.     Comments: Reports stocking distribution of early to right knee.  4/5 weakness in LLE and LUE. 3/5 weakness in RLE.  Nondermatomal pattern of complaints.  Psychiatric:        Attention and Perception: Attention and perception normal.        Mood and Affect: Mood is anxious.        Speech: Speech normal.        Behavior: Behavior normal. Behavior is cooperative.    ED Results / Procedures / Treatments   Labs (all labs ordered are listed, but only abnormal results are displayed) Labs Reviewed  COMPREHENSIVE METABOLIC PANEL - Abnormal; Notable for the following components:      Result Value   Glucose, Bld 108 (*)    All other components within normal limits  CBG MONITORING, ED - Abnormal; Notable for the following components:   Glucose-Capillary 110 (*)  All other components within normal limits  RESP PANEL BY RT-PCR (FLU A&B,  COVID) ARPGX2  CBC  TSH  RAPID URINE DRUG SCREEN, HOSP PERFORMED  MAGNESIUM    EKG EKG Interpretation  Date/Time:  Thursday December 27 2020 15:50:08 EDT Ventricular Rate:  65 PR Interval:  142 QRS Duration: 84 QT Interval:  400 QTC Calculation: 416 R Axis:   31 Text Interpretation: Normal sinus rhythm Normal ECG Confirmed by Gerlene Fee [phone]) on 12/27/2020 11:05:49 PM  Radiology CT Head Wo Contrast  Result Date: 12/27/2020 CLINICAL DATA:  Mental status change, unknown cause EXAM: CT HEAD WITHOUT CONTRAST TECHNIQUE: Contiguous axial images were obtained from the base of the skull through the vertex without intravenous contrast. COMPARISON:  MR head 12/01/2018 BRAIN: BRAIN Patchy and confluent areas of decreased attenuation are noted throughout the deep and periventricular white matter of the cerebral hemispheres bilaterally, compatible with chronic microvascular ischemic disease. No evidence of large-territorial acute infarction. No parenchymal hemorrhage. No mass lesion. No extra-axial collection. No mass effect or midline shift. No hydrocephalus. Basilar cisterns are patent. Vascular: No hyperdense vessel. Atherosclerotic calcifications are present within the cavernous internal carotid arteries. Skull: No acute fracture or focal lesion. Sinuses/Orbits: Paranasal sinuses and mastoid air cells are clear. Bilateral lens replacement. Otherwise orbits are unremarkable. Other: None. IMPRESSION: No acute intracranial abnormality. Electronically Signed   By: Iven Finn M.D.   On: 12/27/2020 16:09    Procedures Procedures   Medications Ordered in ED Medications  oxyCODONE-acetaminophen (PERCOCET) 10-325 MG per tablet 1 tablet (has no administration in time range)  prochlorperazine (COMPAZINE) injection 10 mg (10 mg Intravenous Given 12/27/20 2240)  diphenhydrAMINE (BENADRYL) injection 25 mg (25 mg Intravenous Given 12/27/20 2239)  lactated ringers bolus 1,000 mL (1,000 mLs Intravenous  New Bag/Given 12/27/20 2243)    ED Course  I have reviewed the triage vital signs and the nursing notes.  Pertinent labs & imaging results that were available during my care of the patient were reviewed by me and considered in my medical decision making (see chart for details).    MDM Rules/Calculators/A&P                          SHANITRA PHILLIPPI is a 80 y.o. female presenting with AMS/headache. Initial VS sig for HTN.  BGL 110.   EKG interpretation: NSR, normal intervals, rate 65 bpm.  No ST elevations or depressions, no T wave inversions.  Labs: CBC and CMP WNL. TSH, COVID/flu, UDS pending.  Imaging: CT Head negative for acute intracranial process. MRI pending. Imaging was reviewed by radiology and personally by me.  DDX considered: Hemorrhagic stroke, head trauma, meningitis, encephalopathy, polypharmacy, drug overdose, hypertensive emergency, CVA.  History, examination, and objective data most consistent with altered mental status of unclear etiology.  No systemic infectious signs or symptoms.  No meningismus or rash.  Patient does not appear acutely intoxicated, and has no sign of opioid overdose on exam.  BP decreased without intervention, and continues to complain of symptoms.  Neurologic exam nonfocal and without pathophysiologic pattern to symptoms.  Imaging inconsistent with acute intracranial bleed.  No signs of trauma on exam.  MRI pending for final diagnosis.  Suspect some element of psychiatric contribution, given recent stroke in close family member just prior to her symptoms starting.  Medications: Medications  oxyCODONE-acetaminophen (PERCOCET) 10-325 MG per tablet 1 tablet (has no administration in time range)  prochlorperazine (COMPAZINE) injection 10 mg (10 mg Intravenous  Given 12/27/20 2240)  diphenhydrAMINE (BENADRYL) injection 25 mg (25 mg Intravenous Given 12/27/20 2239)  lactated ringers bolus 1,000 mL (1,000 mLs Intravenous New Bag/Given 12/27/20 2243)    Re-evaluated  after headache cocktail. Hemodynamically stable and in no acute distress. Remains hypertensive. Continues to complain of headache and chronic back pain.  Given home Percocet prior to MRI.  Oncoming provider to follow-up on pending labs and MRI.  Ambulation trial required after interventions of MRI, if continues to be nonambulatory, which is not her baseline per son, will likely require assistance with social placement.  Further management per oncoming team.  Family updated at the bedside.  Patient understands and agrees with the plan.  The plan for this patient was discussed with my attending physician, who voiced agreement and who oversaw evaluation and treatment of this patient.     Note: Estate manager/land agent was used in the creation of this note.  Final Clinical Impression(s) / ED Diagnoses Final diagnoses:  Altered mental status, unspecified altered mental status type    Rx / DC Orders ED Discharge Orders     None        Cherly Hensen, DO 12/28/20 0035    Cherly Hensen, DO 12/28/20 7902    Varney Biles, MD 12/29/20 1329

## 2020-12-27 NOTE — ED Triage Notes (Signed)
Pt from boyfriend's home after son called EMS c/o "slow, stuttered speech, not slurred, generalized weakness." Per EMS, pt's boyfriend to ED approx 2hrs ago as Code Stroke. En route, pt's speech to EMS normal/clear for approx 3-55min, then returned to delayed response. No unilateral deficits, unknown LKN. Per pt, "a couple" oxycodone & fentanyl patch in place r/t chronic back pain.   137/71 62 HR 20g R hand

## 2020-12-28 ENCOUNTER — Emergency Department (HOSPITAL_COMMUNITY): Payer: Medicare Other

## 2020-12-28 DIAGNOSIS — Z87891 Personal history of nicotine dependence: Secondary | ICD-10-CM | POA: Diagnosis not present

## 2020-12-28 DIAGNOSIS — Z87442 Personal history of urinary calculi: Secondary | ICD-10-CM | POA: Diagnosis not present

## 2020-12-28 DIAGNOSIS — K219 Gastro-esophageal reflux disease without esophagitis: Secondary | ICD-10-CM | POA: Diagnosis present

## 2020-12-28 DIAGNOSIS — M5136 Other intervertebral disc degeneration, lumbar region: Secondary | ICD-10-CM | POA: Diagnosis present

## 2020-12-28 DIAGNOSIS — I10 Essential (primary) hypertension: Secondary | ICD-10-CM | POA: Diagnosis present

## 2020-12-28 DIAGNOSIS — Z90710 Acquired absence of both cervix and uterus: Secondary | ICD-10-CM | POA: Diagnosis not present

## 2020-12-28 DIAGNOSIS — M47812 Spondylosis without myelopathy or radiculopathy, cervical region: Secondary | ICD-10-CM | POA: Diagnosis present

## 2020-12-28 DIAGNOSIS — M5137 Other intervertebral disc degeneration, lumbosacral region: Secondary | ICD-10-CM | POA: Diagnosis present

## 2020-12-28 DIAGNOSIS — R29898 Other symptoms and signs involving the musculoskeletal system: Secondary | ICD-10-CM | POA: Diagnosis present

## 2020-12-28 DIAGNOSIS — R4182 Altered mental status, unspecified: Secondary | ICD-10-CM | POA: Diagnosis not present

## 2020-12-28 DIAGNOSIS — F419 Anxiety disorder, unspecified: Secondary | ICD-10-CM | POA: Diagnosis present

## 2020-12-28 DIAGNOSIS — Z20822 Contact with and (suspected) exposure to covid-19: Secondary | ICD-10-CM | POA: Diagnosis present

## 2020-12-28 DIAGNOSIS — Z8249 Family history of ischemic heart disease and other diseases of the circulatory system: Secondary | ICD-10-CM | POA: Diagnosis not present

## 2020-12-28 DIAGNOSIS — Z79891 Long term (current) use of opiate analgesic: Secondary | ICD-10-CM | POA: Diagnosis not present

## 2020-12-28 DIAGNOSIS — Z79899 Other long term (current) drug therapy: Secondary | ICD-10-CM | POA: Diagnosis not present

## 2020-12-28 DIAGNOSIS — R4781 Slurred speech: Secondary | ICD-10-CM | POA: Diagnosis present

## 2020-12-28 DIAGNOSIS — E785 Hyperlipidemia, unspecified: Secondary | ICD-10-CM | POA: Diagnosis present

## 2020-12-28 DIAGNOSIS — R262 Difficulty in walking, not elsewhere classified: Secondary | ICD-10-CM | POA: Diagnosis present

## 2020-12-28 DIAGNOSIS — G8191 Hemiplegia, unspecified affecting right dominant side: Secondary | ICD-10-CM | POA: Diagnosis present

## 2020-12-28 DIAGNOSIS — F32A Depression, unspecified: Secondary | ICD-10-CM | POA: Diagnosis present

## 2020-12-28 DIAGNOSIS — M48061 Spinal stenosis, lumbar region without neurogenic claudication: Secondary | ICD-10-CM | POA: Diagnosis present

## 2020-12-28 DIAGNOSIS — Z981 Arthrodesis status: Secondary | ICD-10-CM | POA: Diagnosis not present

## 2020-12-28 DIAGNOSIS — M47816 Spondylosis without myelopathy or radiculopathy, lumbar region: Secondary | ICD-10-CM | POA: Diagnosis present

## 2020-12-28 DIAGNOSIS — G894 Chronic pain syndrome: Secondary | ICD-10-CM | POA: Diagnosis present

## 2020-12-28 DIAGNOSIS — G9341 Metabolic encephalopathy: Secondary | ICD-10-CM | POA: Diagnosis present

## 2020-12-28 HISTORY — DX: Altered mental status, unspecified: R41.82

## 2020-12-28 LAB — RESP PANEL BY RT-PCR (FLU A&B, COVID) ARPGX2
Influenza A by PCR: NEGATIVE
Influenza B by PCR: NEGATIVE
SARS Coronavirus 2 by RT PCR: NEGATIVE

## 2020-12-28 LAB — RAPID URINE DRUG SCREEN, HOSP PERFORMED
Amphetamines: NOT DETECTED
Barbiturates: NOT DETECTED
Benzodiazepines: NOT DETECTED
Cocaine: NOT DETECTED
Opiates: NOT DETECTED
Tetrahydrocannabinol: NOT DETECTED

## 2020-12-28 MED ORDER — OXYCODONE-ACETAMINOPHEN 10-325 MG PO TABS: 1.0000 | ORAL_TABLET | Freq: Once | ORAL | Status: DC

## 2020-12-28 MED ORDER — CLONAZEPAM 0.5 MG PO TABS
0.5000 mg | ORAL_TABLET | Freq: Every evening | ORAL | Status: DC | PRN
  Administered 2020-12-30 – 2020-12-31 (×2): 0.5 mg via ORAL
  Filled 2020-12-28 (×2): qty 1

## 2020-12-28 MED ORDER — ACETAMINOPHEN 160 MG/5ML PO SOLN: 650.0000 mg | ORAL | Status: DC | PRN

## 2020-12-28 MED ORDER — OXYCODONE HCL 5 MG PO TABS
10.0000 mg | ORAL_TABLET | Freq: Once | ORAL | Status: AC
  Administered 2020-12-28: 10 mg via ORAL
  Filled 2020-12-28: qty 2

## 2020-12-28 MED ORDER — OXYCODONE-ACETAMINOPHEN 5-325 MG PO TABS
1.0000 | ORAL_TABLET | Freq: Once | ORAL | Status: AC
  Administered 2020-12-28: 1 via ORAL
  Filled 2020-12-28: qty 1

## 2020-12-28 MED ORDER — GADOBUTROL 1 MMOL/ML IV SOLN
6.0000 mL | Freq: Once | INTRAVENOUS | Status: AC | PRN
  Administered 2020-12-28: 6 mL via INTRAVENOUS

## 2020-12-28 MED ORDER — MELATONIN 5 MG PO TABS
5.0000 mg | ORAL_TABLET | Freq: Every evening | ORAL | Status: DC | PRN
  Administered 2020-12-28 – 2020-12-30 (×3): 5 mg via ORAL
  Filled 2020-12-28 (×3): qty 1

## 2020-12-28 MED ORDER — OXYCODONE HCL 5 MG PO TABS
5.0000 mg | ORAL_TABLET | Freq: Once | ORAL | Status: AC
  Administered 2020-12-28: 5 mg via ORAL
  Filled 2020-12-28: qty 1

## 2020-12-28 MED ORDER — LISINOPRIL 20 MG PO TABS
20.0000 mg | ORAL_TABLET | Freq: Once | ORAL | Status: AC
  Administered 2020-12-28: 20 mg via ORAL
  Filled 2020-12-28: qty 1

## 2020-12-28 MED ORDER — GABAPENTIN 300 MG PO CAPS
600.0000 mg | ORAL_CAPSULE | Freq: Three times a day (TID) | ORAL | Status: DC
  Administered 2020-12-28 – 2021-01-01 (×12): 600 mg via ORAL
  Filled 2020-12-28 (×12): qty 2

## 2020-12-28 MED ORDER — IOHEXOL 350 MG/ML SOLN
75.0000 mL | Freq: Once | INTRAVENOUS | Status: AC | PRN
  Administered 2020-12-28: 75 mL via INTRAVENOUS

## 2020-12-28 MED ORDER — ACETAMINOPHEN 650 MG RE SUPP: 650.0000 mg | RECTAL | Status: DC | PRN

## 2020-12-28 MED ORDER — ROSUVASTATIN CALCIUM 20 MG PO TABS
20.0000 mg | ORAL_TABLET | Freq: Every day | ORAL | Status: DC
  Administered 2020-12-28 – 2021-01-01 (×5): 20 mg via ORAL
  Filled 2020-12-28 (×5): qty 1

## 2020-12-28 MED ORDER — DULOXETINE HCL 30 MG PO CPEP
30.0000 mg | ORAL_CAPSULE | Freq: Every day | ORAL | Status: DC
  Administered 2020-12-28 – 2021-01-01 (×5): 30 mg via ORAL
  Filled 2020-12-28 (×6): qty 1

## 2020-12-28 MED ORDER — ENOXAPARIN SODIUM 40 MG/0.4ML IJ SOSY
40.0000 mg | PREFILLED_SYRINGE | Freq: Every day | INTRAMUSCULAR | Status: DC
  Administered 2020-12-28 – 2021-01-01 (×5): 40 mg via SUBCUTANEOUS
  Filled 2020-12-28 (×5): qty 0.4

## 2020-12-28 MED ORDER — ACETAMINOPHEN 325 MG PO TABS
650.0000 mg | ORAL_TABLET | ORAL | Status: DC | PRN
  Filled 2020-12-28: qty 2

## 2020-12-28 MED ORDER — TRAZODONE HCL 100 MG PO TABS
100.0000 mg | ORAL_TABLET | Freq: Every day | ORAL | Status: DC
  Administered 2020-12-28 – 2020-12-31 (×4): 100 mg via ORAL
  Filled 2020-12-28: qty 1
  Filled 2020-12-28: qty 2
  Filled 2020-12-28 (×2): qty 1

## 2020-12-28 MED ORDER — SENNOSIDES-DOCUSATE SODIUM 8.6-50 MG PO TABS: 1.0000 | ORAL_TABLET | Freq: Every evening | ORAL | Status: DC | PRN

## 2020-12-28 MED ORDER — STROKE: EARLY STAGES OF RECOVERY BOOK
Freq: Once | Status: AC
  Filled 2020-12-28: qty 1

## 2020-12-28 MED ORDER — ONDANSETRON HCL 4 MG/2ML IJ SOLN
4.0000 mg | Freq: Four times a day (QID) | INTRAMUSCULAR | Status: DC | PRN
  Administered 2020-12-28 – 2020-12-31 (×3): 4 mg via INTRAVENOUS
  Filled 2020-12-28 (×4): qty 2

## 2020-12-28 MED ORDER — QUINAPRIL HCL 10 MG PO TABS
20.0000 mg | ORAL_TABLET | Freq: Every day | ORAL | Status: DC
  Administered 2020-12-28 – 2021-01-01 (×5): 20 mg via ORAL
  Filled 2020-12-28 (×5): qty 2

## 2020-12-28 MED ORDER — LORAZEPAM 2 MG/ML IJ SOLN
0.5000 mg | Freq: Once | INTRAMUSCULAR | Status: AC
  Administered 2020-12-28: 0.5 mg via INTRAVENOUS
  Filled 2020-12-28: qty 1

## 2020-12-28 NOTE — Evaluation (Signed)
Physical Therapy Evaluation Patient Details Name: Alexandria Mclaughlin MRN: [id number] DOB: Jan 19, 1941 Today's Date: 12/28/2020  History of Present Illness  80 yo female with onset of increasing weakness, gait dysfunction and worsening back pain was admitted to hosp on 11/3 with slurred speech and confusion.  Has new RLE numbness and new need for RW.  Cleared by MRI for stroke suspicions.  PMHx:  cervical facet syndrome, depression, TLIF lumbar spine, retinal detachment surgery, GERD, HA, kidney stones, HTN, sciatic neuritis,   Clinical Impression  Pt was seen to progress from bed to standing but not able to safely walk. Too unsteady in standing with low endurance, and even with support is unable to take a step.  Will recommend strengthening and will await further information on the plans for spinal management.  Follow along for goals of PT and will encourage pt to be up and active as her condition permits.  May need to be premedicated to have therapy given the limits of her tolerance.      Recommendations for follow up therapy are one component of a multi-disciplinary discharge planning process, led by the attending physician.  Recommendations may be updated based on patient status, additional functional criteria and insurance authorization.  Follow Up Recommendations Skilled nursing-short term rehab (<3 hours/day)    Assistance Recommended at Discharge Frequent or constant Supervision/Assistance  Functional Status Assessment Patient has had a recent decline in their functional status and demonstrates the ability to make significant improvements in function in a reasonable and predictable amount of time.  Equipment Recommendations  None recommended by PT    Recommendations for Other Services       Precautions / Restrictions Precautions Precautions: Fall;Back Precaution Booklet Issued: No Precaution Comments: verbally reviewed Required Braces or Orthoses:  (none ordered) Restrictions Weight  Bearing Restrictions: No      Mobility  Bed Mobility Overal bed mobility: Needs Assistance Bed Mobility: Sidelying to Sit;Sit to Sidelying   Sidelying to sit: Mod assist     Sit to sidelying: Mod assist General bed mobility comments: instructed body mechanics which pt is not fully demonstratng unless cued    Transfers Overall transfer level: Needs assistance Equipment used: 1 person hand held assist;Rolling walker (2 wheels) Transfers: Sit to/from Stand;Lateral/Scoot Transfers Sit to Stand: Mod assist;From elevated surface          Lateral/Scoot Transfers: Mod assist General transfer comment: mod assist to stand with BUE support    Ambulation/Gait             General Gait Details: deferred due to weakness  Stairs            Wheelchair Mobility    Modified Rankin (Stroke Patients Only)       Balance Overall balance assessment: Needs assistance Sitting-balance support: Feet supported;Single extremity supported Sitting balance-Leahy Scale: Fair     Standing balance support: Bilateral upper extremity supported;During functional activity Standing balance-Leahy Scale: Poor                               Pertinent Vitals/Pain      Home Living Family/patient expects to be discharged to:: Private residence Living Arrangements: Children Available Help at Discharge: Family;Available 24 hours/day Type of Home: House Home Access: Level entry       Home Layout: One level Home Equipment: Conservation officer, nature (2 wheels);Cane - single point;Shower seat - built in;Grab bars - toilet      Prior Function  Prior Level of Function : Needs assist  Cognitive Assist : Mobility (cognitive) Mobility (Cognitive): Set up cues   Physical Assist : Mobility (physical) Mobility (physical): Bed mobility;Transfers;Gait   Mobility Comments: recently had to start PT and required help to stand up       Hand Dominance   Dominant Hand: Right     Extremity/Trunk Assessment   Upper Extremity Assessment Upper Extremity Assessment: Overall WFL for tasks assessed    Lower Extremity Assessment Lower Extremity Assessment: RLE deficits/detail RLE Deficits / Details: weakness RLE of 4- to4 RLE Sensation: decreased light touch RLE Coordination: decreased gross motor    Cervical / Trunk Assessment Cervical / Trunk Assessment: Back Surgery (surgery from earlier 2020)  Communication   Communication: No difficulties  Cognition Arousal/Alertness: Awake/alert Behavior During Therapy: WFL for tasks assessed/performed Overall Cognitive Status: Within Functional Limits for tasks assessed                                          General Comments General comments (skin integrity, edema, etc.): pt was assisted to get to side of bed and balance, then to return to bed after standing and scooting.  Pt is in more pain after moving, and repositioned her for comfort on back. Transport had arrived for back MRI but pt declined having been to other testing today    Exercises     Assessment/Plan    PT Assessment Patient needs continued PT services  PT Problem List Decreased strength;Decreased activity tolerance;Decreased balance;Decreased mobility;Decreased coordination;Decreased skin integrity;Pain       PT Treatment Interventions DME instruction;Gait training;Functional mobility training;Therapeutic activities;Therapeutic exercise;Balance training;Neuromuscular re-education;Patient/family education    PT Goals (Current goals can be found in the Care Plan section)  Acute Rehab PT Goals Patient Stated Goal: to get up to walk and get back pain managed PT Goal Formulation: With patient Time For Goal Achievement: 01/11/21 Potential to Achieve Goals: Good    Frequency Min 3X/week   Barriers to discharge Decreased caregiver support home with son    Co-evaluation               AM-PAC PT "6 Clicks" Mobility  Outcome  Measure Help needed turning from your back to your side while in a flat bed without using bedrails?: A Lot Help needed moving from lying on your back to sitting on the side of a flat bed without using bedrails?: A Lot Help needed moving to and from a bed to a chair (including a wheelchair)?: A Lot Help needed standing up from a chair using your arms (e.g., wheelchair or bedside chair)?: A Lot Help needed to walk in hospital room?: Total Help needed climbing 3-5 steps with a railing? : Total 6 Click Score: 10    End of Session Equipment Utilized During Treatment: Gait belt Activity Tolerance: Patient limited by fatigue;Patient limited by pain Patient left: in bed;with call bell/phone within reach;with nursing/sitter in room Nurse Communication: Mobility status PT Visit Diagnosis: Unsteadiness on feet (R26.81);Muscle weakness (generalized) (M62.81);Difficulty in walking, not elsewhere classified (R26.2);Pain Pain - Right/Left:  (back) Pain - part of body:  (back)    Time: 7096-2836 PT Time Calculation (min) (ACUTE ONLY): 34 min   Charges:   PT Evaluation $PT Eval Moderate Complexity: 1 Mod PT Treatments $Therapeutic Activity: 8-22 mins       Ramond Dial 12/28/2020, 5:28 PM  Mee Hives, PT  PhD Acute Rehab Dept. Number: [place]

## 2020-12-28 NOTE — ED Notes (Signed)
Patient transported to CT 

## 2020-12-28 NOTE — ED Provider Notes (Signed)
  Provider Note MRN:  [id number]  Arrival date & time: 12/28/20    ED Course and Medical Decision Making  Assumed care from Dr. Kathrynn Humble at shift change.  Neurological complaints shortly after witnessing her boyfriend have a stroke.  Right leg stocking distribution numbness, left arm weakness, does not fit a clear stroke pattern.  Awaiting MRI.  If reassuring would be a candidate for discharge.  Still awaiting MRI, signed out to oncoming provider at shift change.  Procedures  Final Clinical Impressions(s) / ED Diagnoses     ICD-10-CM   1. Altered mental status, unspecified altered mental status type  R41.82       ED Discharge Orders     None       Discharge Instructions   None     Barth Kirks. [place]onia Small, [place] [email or web address]    Maudie Flakes, MD 12/28/20 [phone]

## 2020-12-28 NOTE — H&P (Signed)
History and Physical    Alexandria Mclaughlin TRR:[id number] DOB: 14-May-1940 DOA: 12/27/2020  PCP: Merrilee Seashore, MD (Confirm with patient/family/NH records and if not entered, this has to be entered at [facility] point of entry) Patient coming from: Home  I have personally briefly reviewed patient's old medical records in [place]  Chief Complaint: Trouble walking  HPI: Alexandria Mclaughlin is a 80 y.o. female with medical history significant of lumbar stenosis status post surgery 2020, chronic back pain on high-dose narcotic, HTN, came with multiple complaints including worsening of ambulation dysfunction, slurred speech and confusion.  Patient lives with her son at home.  She has chronic back pain on fentanyl patch and oxycodone and gabapentin, at baseline, she was able to walk on her own.  However for the last 4 to 5 weeks, her ambulation status has deteriorated.  Family noticed her gait has been shuffling and very wobbly, she said both lower legs are weaker compared to before, right>left, and she also developed right leg below the knee numbness which is new compared to her baseline but only right foot numbness after back surgery in 2020.  Last week, ambulation status became worse, she started use roller walker to prevent fall.  She also reported occasionally feeling lightheaded, and her PCP gave her a few days worth of meclizine which she tried and did not work.  She tried to reach her neurosurgeon, but the most recent appointment will be next January.  Yesterday morning, son noticed patient woke up with new onset of confusion and slurred speech.  And patient tries to stand up but then collapsed back to the chair.  Family called EMS, EMS arrived and found the patient unable to stand up on her own.  Patient denied any saddle area numbness, but she reported urinary frequency recently.  No trouble/incontinence of BM.  No numbness or weakness of upper extremities.  ED Course: Confusion and slurred speech  resolved.  MRI negative for acute stroke. CBC, BMP largely normal.  Review of Systems: As per HPI otherwise 14 point review of systems negative.    Past Medical History:  Diagnosis Date   Cervical facet syndrome    Depression    Disorders of sacrum    Enthesopathy of hip region    GERD (gastroesophageal reflux disease)    Headache    History of kidney stones    Hypertension    Sciatic neuritis    Synovitis and tenosynovitis    Vision abnormalities     Past Surgical History:  Procedure Laterality Date   ABDOMINAL HYSTERECTOMY     BACK SURGERY     2011   [place]     x2   RETINAL DETACHMENT SURGERY  9/13   TRANSFORAMINAL LUMBAR INTERBODY FUSION W/ MIS 1 LEVEL Right 04/26/2018   Procedure: Right Lumbar four-five Minimally invasive Transforaminal lumbar interbody fusion;  Surgeon: Judith Part, MD;  Location: [place];  Service: Neurosurgery;  Laterality: Right;     reports that she quit smoking about 52 years ago. Her smoking use included cigarettes. She has a 2.50 pack-year smoking history. She has never used smokeless tobacco. She reports that she does not currently use alcohol after a past usage of about 1.0 standard drink per week. She reports that she does not use drugs.  No Known Allergies  Family History  Problem Relation Age of Onset   Diabetes Mother    Heart disease Mother    Stroke  Mother    Heart disease Father    Heart disease Brother      Prior to Admission medications   Medication Sig Start Date End Date Taking? Authorizing Provider  Calcium Carb-Cholecalciferol (CALCIUM+D3 PO) 1 tablet 04/29/16   [provider]  clonazePAM (KLONOPIN) 0.5 MG tablet TAKE 1 TABLET BY MOUTH EVERY DAY AT BEDTIME AS NEEDED 07/24/20   Bayard Hugger, NP  [employer] MOTION SICKNESS RELIEF 25 MG CHEW Chew 1-2 tablets by mouth 3 (three) times daily. 12/03/20   [provider]  DULoxetine (CYMBALTA) 30 MG capsule TAKE 1 CAPSULE BY  MOUTH EVERY DAY 08/08/20   Bayard Hugger, NP  fentaNYL (DURAGESIC) 25 MCG/HR Place 1 patch onto the skin every 3 (three) days. 12/13/20   Bayard Hugger, NP  gabapentin (NEURONTIN) 600 MG tablet TAKE 1 TABLET 4 TIMES A DAY 12/10/20   Bayard Hugger, NP  LAGEVRIO 200 MG CAPS SMARTSIG:4 Capsule(s) By Mouth Every 12 Hours 09/17/20   [provider]  Melatonin 5 MG CAPS Take 5 mg by mouth at bedtime as needed (sleep).    [provider]  Molnupiravir (LAGEVRIO) 200 MG CAPS 4 capsules 09/17/20   [provider]  molnupiravir EUA (LAGEVRIO) 200 MG CAPS capsule 4 capsules 09/17/20   [provider]  Multiple Vitamin (MULTIVITAMIN WITH MINERALS) TABS tablet Take 1 tablet by mouth daily.    [provider]  oxyCODONE-acetaminophen (PERCOCET) 10-325 MG tablet Take 1 tablet by mouth every 6 (six) hours as needed for pain. 12/13/20   Bayard Hugger, NP  Pseudoeph-Bromphen-DM (BROMFED DM PO) 5 ml as needed 09/17/20   [provider]  quinapril (ACCUPRIL) 20 MG tablet Take 20 mg by mouth daily.    [provider]  rosuvastatin (CRESTOR) 20 MG tablet Take 20 mg by mouth daily.    [provider]  traZODone (DESYREL) 100 MG tablet TAKE 1 TABLET BY MOUTH EVERYDAY AT BEDTIME 07/24/20   Meredith Staggers, MD  triamcinolone cream (KENALOG) 0.1 % 1 application 08/22/45   [provider]    Physical Exam: Vitals:   12/28/20 1039 12/28/20 1100 12/28/20 1229 12/28/20 1300  BP: (!) 178/98 (!) 172/81 (!) 182/84 (!) 178/89  Pulse: 80 80 87 77  Resp: 14 19 (!) 21 13  Temp:      TempSrc:      SpO2: 99% 98% 96% 97%    Constitutional: NAD, calm, comfortable Vitals:   12/28/20 1039 12/28/20 1100 12/28/20 1229 12/28/20 1300  BP: (!) 178/98 (!) 172/81 (!) 182/84 (!) 178/89  Pulse: 80 80 87 77  Resp: 14 19 (!) 21 13  Temp:      TempSrc:      SpO2: 99% 98% 96% 97%   Eyes: PERRL, lids and conjunctivae normal ENMT: Mucous membranes  are moist. Posterior pharynx clear of any exudate or lesions.Normal dentition.  Neck: normal, supple, no masses, no thyromegaly Respiratory: clear to auscultation bilaterally, no wheezing, no crackles. Normal respiratory effort. No accessory muscle use.  Cardiovascular: Regular rate and rhythm, no murmurs / rubs / gallops. No extremity edema. 2+ pedal pulses. No carotid bruits.  Abdomen: no tenderness, no masses palpated. No hepatosplenomegaly. Bowel sounds positive.  Musculoskeletal: no clubbing / cyanosis. No joint deformity upper and lower extremities. Good ROM, no contractures. Normal muscle tone.  Skin: no rashes, lesions, ulcers. No induration Neurologic: CN 2-12 grossly intact. Sensation diminished on right leg below the knee compared to left side intact., DTR  normal. Strength 4/5 in right leg below the knee compared to 5/5 on the left side.  Psychiatric: Normal judgment and insight. Alert and oriented x 3. Normal mood.     Labs on Admission: I have personally reviewed following labs and imaging studies  CBC: Recent Labs  Lab 12/27/20 1553  WBC 8.5  HGB 13.9  HCT 43.6  MCV 93.6  PLT 557   Basic Metabolic Panel: Recent Labs  Lab 12/27/20 1553  NA 140  K 4.3  CL 105  CO2 30  GLUCOSE 108*  BUN 17  CREATININE 0.60  CALCIUM 9.7   GFR: CrCl cannot be calculated (Unknown ideal weight.). Liver Function Tests: Recent Labs  Lab 12/27/20 1553  AST 24  ALT 18  ALKPHOS 64  BILITOT 0.6  PROT 6.6  ALBUMIN 3.8   No results for input(s): LIPASE, AMYLASE in the last 168 hours. No results for input(s): AMMONIA in the last 168 hours. Coagulation Profile: No results for input(s): INR, PROTIME in the last 168 hours. Cardiac Enzymes: No results for input(s): CKTOTAL, CKMB, CKMBINDEX, TROPONINI in the last 168 hours. BNP (last 3 results) No results for input(s): PROBNP in the last 8760 hours. HbA1C: No results for input(s): HGBA1C in the last 72 hours. CBG: Recent Labs   Lab 12/27/20 1546  GLUCAP 110*   Lipid Profile: No results for input(s): CHOL, HDL, LDLCALC, TRIG, CHOLHDL, LDLDIRECT in the last 72 hours. Thyroid Function Tests: No results for input(s): TSH, T4TOTAL, FREET4, T3FREE, THYROIDAB in the last 72 hours. Anemia Panel: No results for input(s): VITAMINB12, FOLATE, FERRITIN, TIBC, IRON, RETICCTPCT in the last 72 hours. Urine analysis:    Component Value Date/Time   COLORURINE YELLOW 02/21/2009 1122   APPEARANCEUR CLEAR 02/21/2009 1122   LABSPEC 1.036 (H) 02/21/2009 1122   PHURINE 6.0 02/21/2009 1122   GLUCOSEU NEGATIVE 02/21/2009 1122   HGBUR NEGATIVE 02/21/2009 1122   BILIRUBINUR NEGATIVE 02/21/2009 1122   KETONESUR NEGATIVE 02/21/2009 1122   PROTEINUR NEGATIVE 02/21/2009 1122   UROBILINOGEN 0.2 02/21/2009 1122   NITRITE NEGATIVE 02/21/2009 1122   LEUKOCYTESUR SMALL (A) 02/21/2009 1122    Radiological Exams on Admission: CT Angio Head W or Wo Contrast  Result Date: 12/28/2020 CLINICAL DATA:  Neuro deficit, stroke suspected EXAM: CT ANGIOGRAPHY HEAD AND NECK TECHNIQUE: Multidetector CT imaging of the head and neck was performed using the standard protocol during bolus administration of intravenous contrast. Multiplanar CT image reconstructions and MIPs were obtained to evaluate the vascular anatomy. Carotid stenosis measurements (when applicable) are obtained utilizing NASCET criteria, using the distal internal carotid diameter as the denominator. CONTRAST:  46mL OMNIPAQUE IOHEXOL 350 MG/ML SOLN COMPARISON:  CT head 12/27/2020 FINDINGS: CT HEAD FINDINGS Brain: No evidence of acute abnormality, including acute infarct, hemorrhage, hydrocephalus, or mass lesion. Periventricular white matter changes, likely the sequela of chronic small vessel ischemic disease. No extra-axial collection. Vascular: No hyperdense vessel.  Please see CTA findings below. Skull: Normal. Negative for fracture or focal lesion. Sinuses: Imaged portions are clear. Status  post bilateral lens replacements. Orbits: The mastoids are well aerated. Review of the MIP images confirms the above findings CTA NECK FINDINGS Aortic arch: Standard branching. Imaged portion shows no evidence of aneurysm or dissection. No significant stenosis of the major arch vessel origins. Right carotid system: No evidence of dissection, stenosis (50% or greater) or occlusion. Left carotid system: No evidence of dissection, stenosis (50% or greater) or occlusion. Vertebral arteries: Codominant. No evidence of dissection, stenosis (50% or greater) or  occlusion. Skeleton: No acute osseous abnormality. Other neck: Multiple subcentimeter hypoattenuating nodules in the thyroid, which is heterogeneously enhancing. Otherwise negative. Upper chest: No focal pulmonary opacity or pleural effusion. Review of the MIP images confirms the above findings CTA HEAD FINDINGS Anterior circulation: Both internal carotid arteries are patent to the termini, without stenosis or other abnormality. Calcifications in the left greater than right cavernous carotids without focal narrowing. Aplastic or hypoplastic right A1, with a large patent left A1. Normal anterior communicating artery. Anterior cerebral arteries are patent to their distal aspects. No M1 stenosis or occlusion. Normal MCA bifurcations. Distal MCA branches perfused and symmetric. Posterior circulation: Vertebral arteries widely patent to the vertebrobasilar junction without stenosis. Posterior inferior cerebral arteries patent bilaterally. Basilar patent to its distal aspect. Superior cerebral arteries patent bilaterally. Aplastic right P1. Fetal origin of the right PCA. PCAs well perfused to their distal aspects without stenosis. Relative diminutive but patent left posterior communicating artery. Venous sinuses: As permitted by contrast timing, patent. Anatomic variants: None significant Review of the MIP images confirms the above findings IMPRESSION: 1. No acute  intracranial process. 2. No hemodynamically significant stenosis in the neck. 3. No intracranial large vessel occlusion or significant stenosis. Electronically Signed   By: Merilyn Baba M.D.   On: 12/28/2020 12:47   CT Head Wo Contrast  Result Date: 12/27/2020 CLINICAL DATA:  Mental status change, unknown cause EXAM: CT HEAD WITHOUT CONTRAST TECHNIQUE: Contiguous axial images were obtained from the base of the skull through the vertex without intravenous contrast. COMPARISON:  MR head 12/01/2018 BRAIN: BRAIN Patchy and confluent areas of decreased attenuation are noted throughout the deep and periventricular white matter of the cerebral hemispheres bilaterally, compatible with chronic microvascular ischemic disease. No evidence of large-territorial acute infarction. No parenchymal hemorrhage. No mass lesion. No extra-axial collection. No mass effect or midline shift. No hydrocephalus. Basilar cisterns are patent. Vascular: No hyperdense vessel. Atherosclerotic calcifications are present within the cavernous internal carotid arteries. Skull: No acute fracture or focal lesion. Sinuses/Orbits: Paranasal sinuses and mastoid air cells are clear. Bilateral lens replacement. Otherwise orbits are unremarkable. Other: None. IMPRESSION: No acute intracranial abnormality. Electronically Signed   By: Iven Finn M.D.   On: 12/27/2020 16:09   CT Angio Neck W and/or Wo Contrast  Result Date: 12/28/2020 CLINICAL DATA:  Neuro deficit, stroke suspected EXAM: CT ANGIOGRAPHY HEAD AND NECK TECHNIQUE: Multidetector CT imaging of the head and neck was performed using the standard protocol during bolus administration of intravenous contrast. Multiplanar CT image reconstructions and MIPs were obtained to evaluate the vascular anatomy. Carotid stenosis measurements (when applicable) are obtained utilizing NASCET criteria, using the distal internal carotid diameter as the denominator. CONTRAST:  57mL OMNIPAQUE IOHEXOL 350 MG/ML  SOLN COMPARISON:  CT head 12/27/2020 FINDINGS: CT HEAD FINDINGS Brain: No evidence of acute abnormality, including acute infarct, hemorrhage, hydrocephalus, or mass lesion. Periventricular white matter changes, likely the sequela of chronic small vessel ischemic disease. No extra-axial collection. Vascular: No hyperdense vessel.  Please see CTA findings below. Skull: Normal. Negative for fracture or focal lesion. Sinuses: Imaged portions are clear. Status post bilateral lens replacements. Orbits: The mastoids are well aerated. Review of the MIP images confirms the above findings CTA NECK FINDINGS Aortic arch: Standard branching. Imaged portion shows no evidence of aneurysm or dissection. No significant stenosis of the major arch vessel origins. Right carotid system: No evidence of dissection, stenosis (50% or greater) or occlusion. Left carotid system: No evidence of dissection, stenosis (50% or  greater) or occlusion. Vertebral arteries: Codominant. No evidence of dissection, stenosis (50% or greater) or occlusion. Skeleton: No acute osseous abnormality. Other neck: Multiple subcentimeter hypoattenuating nodules in the thyroid, which is heterogeneously enhancing. Otherwise negative. Upper chest: No focal pulmonary opacity or pleural effusion. Review of the MIP images confirms the above findings CTA HEAD FINDINGS Anterior circulation: Both internal carotid arteries are patent to the termini, without stenosis or other abnormality. Calcifications in the left greater than right cavernous carotids without focal narrowing. Aplastic or hypoplastic right A1, with a large patent left A1. Normal anterior communicating artery. Anterior cerebral arteries are patent to their distal aspects. No M1 stenosis or occlusion. Normal MCA bifurcations. Distal MCA branches perfused and symmetric. Posterior circulation: Vertebral arteries widely patent to the vertebrobasilar junction without stenosis. Posterior inferior cerebral arteries  patent bilaterally. Basilar patent to its distal aspect. Superior cerebral arteries patent bilaterally. Aplastic right P1. Fetal origin of the right PCA. PCAs well perfused to their distal aspects without stenosis. Relative diminutive but patent left posterior communicating artery. Venous sinuses: As permitted by contrast timing, patent. Anatomic variants: None significant Review of the MIP images confirms the above findings IMPRESSION: 1. No acute intracranial process. 2. No hemodynamically significant stenosis in the neck. 3. No intracranial large vessel occlusion or significant stenosis. Electronically Signed   By: Merilyn Baba M.D.   On: 12/28/2020 12:47   MR BRAIN W WO CONTRAST  Result Date: 12/28/2020 CLINICAL DATA:  Neuro deficit, acute, stroke suspected; slurred speech EXAM: MRI HEAD WITHOUT AND WITH CONTRAST TECHNIQUE: Multiplanar, multiecho pulse sequences of the brain and surrounding structures were obtained without and with intravenous contrast. CONTRAST:  1mL GADAVIST GADOBUTROL 1 MMOL/ML IV SOLN COMPARISON:  October 2020 FINDINGS: Brain: There is no acute infarction or intracranial hemorrhage. There is no intracranial mass, mass effect, or edema. There is no hydrocephalus or extra-axial fluid collection. Ventricles and sulci are within normal limits in size and configuration. Patchy and confluent areas of T2 hyperintensity in the supratentorial and pontine white matter are nonspecific but probably reflect similar moderate chronic microvascular ischemic changes. A focus of susceptibility associated with T2 hyperintensity is again noted in the right frontal white matter likely reflecting gliosis and chronic blood products. No abnormal enhancement. Vascular: Major vessel flow voids at the skull base are preserved. Skull and upper cervical spine: Normal marrow signal is preserved. Sinuses/Orbits: Paranasal sinus mucosal thickening. Bilateral lens replacements. Other: Sella is unremarkable.  Mastoid  air cells are clear. IMPRESSION: No evidence of recent infarction, hemorrhage, or mass. No abnormal enhancement. Similar moderate chronic microvascular ischemic changes. Electronically Signed   By: Macy Mis M.D.   On: 12/28/2020 09:57    EKG: Pending  Assessment/Plan Active Problems:   AMS (altered mental status)   Impaired ambulation  (please populate well all problems here in Problem List. (For example, if patient is on BP meds at home and you resume or decide to hold them, it is a problem that needs to be her. Same for CAD, COPD, HLD and so on)  Acute on chronic ambulation dysfunction -Suspect recurrent/flareup of lumbar stenosis right> left. -Other differential, TIA less likely given her neuro symptoms are mainly still involves only the L4-5 distribution where she had surgery 2 years ago. -Neurology on board recommend TIA work-up, echocardiogram ordered. -Given the significant impairment of ambulation dysfunction happening in such a short time less than 4 weeks and continue to get worse, will order a lumbar spine MRI to further evaluate.  As  patient's own request, lumbar MRI ordered for tomorrow. -PT OT, likely will need rehab.  Acute metabolic encephalopathy -Confirmed with patient and his son at bedside that patient has been taking the same regimen/dosage of narcotics for more than 2 years after back surgery.  No changes of her pain medication and other neuro medications such as gabapentin, makes polypharmacy less likely. -UA pending to rule out UTI. -TSH pending, B12 pending. -Other DDX: son also reported that the patient was very stressful yesterday morning as her boyfriend decided CVA.  So acute of somatization is also possible this point.  HTN -Resume home BP meds.  Anxiety/depression -Continue SSRI  DVT prophylaxis: Lovenox Code Status: Full code Family Communication: Son at bedside Disposition Plan: Expect more than 2 midnight hospital stay, may need to go to  rehab/SNF. Consults called: Neurology Admission status: MedSurg admission   Lequita Halt MD [facility] Pager [phone]  12/28/2020, 2:01 PM

## 2020-12-28 NOTE — ED Notes (Signed)
Dinner ordered 

## 2020-12-28 NOTE — ED Notes (Signed)
Pt requesting medication for her nerves before MRI. MD Tegeler made aware.

## 2020-12-28 NOTE — ED Provider Notes (Signed)
7:17 AM Care assumed from Dr. Sedonia Small.  At time of transfer of care, patient is awaiting for ambulatory testing after MRI is completed.  If MRI is reassuring and patient is able to ambulate, plan of care will be to discharge for outpatient follow-up.  11:15 AM MRI just with and without evidence of acute mass, bleeding, hemorrhage, or acute stroke, only chronic microvascular changes seen.  I went to assess the patient and tell her the reassuring results and patient is still reporting that her right leg feels numb and weak from the knee down and she says that she is unable to stand or walk safely.  She is still concerned about what is going on.  On my quick reassessment, she does still have some weakness and numbness in the right foot compared to left but more concerning is her left pupil appears much larger than the right.  Looking through the notes from the last 19 hours of observation emergency department I do not see any documentation of difference in pupil size.  She is denying any new double vision but reports some chronic diplopia when she does not have her glasses on and this is not significant.  Due to the pupil difference and her continued symptoms in the right leg despite reassuring MRI, will call neurology to discuss management.  11:40 AM Spoke with Dr. Quinn Axe with neurology who was able to do a more thorough chart review.  She does see evidence of previous pupil difference in the past with the right being smaller than the left.  However, with the reported worsening continued difficulty with ambulation, the right leg numbness and weakness, she did feel that it was reasonable to both get a CTA head and neck to rule out vascular abnormality and then admit to medicine for PT/OT/evaluation of this worsening problem as well as work-up for possible TIA.  1:17 PM CTA head and neck are reassuring.  Per the plan with neurology, patient will be called for admission for further evaluation and management of  possible TIA with transient confusion and speech difficulty as well as her worsened right leg numbness/weakness and her worsened difficulty with ambulation and cannot walk.  Medicine called for admission.   Clinical Impression: 1. Altered mental status, unspecified altered mental status type   2. Right leg weakness     Disposition: Admit  This note was prepared with assistance of Dragon voice recognition software. Occasional wrong-word or sound-a-like substitutions may have occurred due to the inherent limitations of voice recognition software.     Alberto Pina, Gwenyth Allegra, MD 12/28/20 1500

## 2020-12-28 NOTE — ED Notes (Signed)
Patient transported to MRI 

## 2020-12-29 ENCOUNTER — Inpatient Hospital Stay (HOSPITAL_COMMUNITY): Payer: Medicare Other

## 2020-12-29 ENCOUNTER — Other Ambulatory Visit (HOSPITAL_COMMUNITY): Payer: Medicare Other

## 2020-12-29 DIAGNOSIS — I10 Essential (primary) hypertension: Secondary | ICD-10-CM | POA: Diagnosis not present

## 2020-12-29 DIAGNOSIS — R262 Difficulty in walking, not elsewhere classified: Secondary | ICD-10-CM

## 2020-12-29 LAB — ECHOCARDIOGRAM COMPLETE
AR max vel: 2.4 cm2
AV Area VTI: 2.01 cm2
AV Area mean vel: 2.17 cm2
AV Mean grad: 7 mmHg
AV Peak grad: 12.4 mmHg
Ao pk vel: 1.76 m/s
Area-P 1/2: 2.99 cm2
S' Lateral: 2.6 cm

## 2020-12-29 LAB — HEMOGLOBIN A1C
Hgb A1c MFr Bld: 6 % — ABNORMAL HIGH (ref 4.8–5.6)
Mean Plasma Glucose: 125.5 mg/dL

## 2020-12-29 LAB — LIPID PANEL
Cholesterol: 144 mg/dL (ref 0–200)
HDL: 56 mg/dL (ref >40)
LDL Cholesterol: 69 mg/dL (ref 0–99)
Total CHOL/HDL Ratio: 2.6 RATIO
Triglycerides: 93 mg/dL (ref <150)
VLDL: 19 mg/dL (ref 0–40)

## 2020-12-29 LAB — VITAMIN B12: Vitamin B-12: 239 pg/mL (ref 180–914)

## 2020-12-29 MED ORDER — LORAZEPAM 2 MG/ML IJ SOLN
1.0000 mg | Freq: Once | INTRAMUSCULAR | Status: AC
  Administered 2020-12-29: 1 mg via INTRAVENOUS
  Filled 2020-12-29: qty 1

## 2020-12-29 MED ORDER — OXYCODONE-ACETAMINOPHEN 5-325 MG PO TABS
2.0000 | ORAL_TABLET | Freq: Four times a day (QID) | ORAL | Status: DC | PRN
  Administered 2020-12-29 – 2021-01-01 (×8): 2 via ORAL
  Filled 2020-12-29 (×8): qty 2

## 2020-12-29 MED ORDER — NALOXONE HCL 0.4 MG/ML IJ SOLN: 0.4000 mg | INTRAMUSCULAR | Status: DC | PRN

## 2020-12-29 NOTE — ED Notes (Signed)
Pt being transported to 3W32, still needs urine, OT, and echo.

## 2020-12-29 NOTE — Progress Notes (Signed)
Echocardiogram not completed patient going to MRI. Will re-attempt at another time.  Darlina Sicilian RDCS

## 2020-12-29 NOTE — ED Notes (Addendum)
Pt went from MRI to Echo, nobody in echo, pt brought back to room. Called echo, no answer. Remains alert, NA, calm, interactive.

## 2020-12-29 NOTE — Progress Notes (Addendum)
PROGRESS NOTE  Alexandria Mclaughlin HKV:[id number] DOB: 1940/07/18 DOA: 12/27/2020 PCP: Merrilee Seashore, MD   LOS: 1 day   Brief narrative: Alexandria Mclaughlin is a 80 y.o. female with past medical history of lumbar stenosis status post surgery, chronic back pain on high-dose narcotics, hypertension presented to hospital with difficulty ambulating slurred speech and confusion.  Patient does have fentanyl patch oxycodone and gabapentin at baseline and is able to walk on her own.  There was no change in her medications.  Patient this time has been having shuffling gait wobbly nests with numbness below the knee on the right side which was new.  Also note of confusion and slurred speech so patient was brought into the hospital.  Patient was then admitted to hospital for further evaluation and treatment.    Assessment/Plan:  Active Problems:   AMS (altered mental status)   Impaired ambulation  Acute on chronic ambulatory dysfunction Suspected recurrent/flareup of lumbar stenosis.  TIA on the differential so neurology was consulted who recommended TIA work-up and echocardiogram.  MRI of the brain did not show any acute findings except for moderate chronic microvascular ischemic changes..  Physical therapy has seen the patient and recommended skilled nursing facility placement. Lipid profile reviewed.  Hemoglobin A1c of 6.0.  CTA head and neck without any large vessel occlusion.  Patient is a scheduled for MRI of the lumbar sacral spine at this time.   Acute metabolic encephalopathy No leukocytosis or fever.  Vitamin B12 was 239.  COVID and influenza was negative.  Urine drug screen was negative, pending urinalysis.  Check TSH.  Patient appears to be alert awake oriented at this time.   Essential hypertension Continue quinapril   Anxiety/depression Continue Cymbalta, gabapentin   Disposition.  At this time patient has been seen by physical therapy recommended skilled nursing facility placement.  DVT  prophylaxis: enoxaparin (LOVENOX) injection 40 mg Start: 12/28/20 1415  Code Status: Full code  Family Communication:  Tried to reach the patient's her daughter listed on the phone but was unable to reach her.  Status is: Inpatient  Remains inpatient appropriate because: Further work-up, need for skilled nursing facility  Consultants: Neurology as per admitting provider  Procedures: None  Anti-infectives:  None  Anti-infectives (From admission, onward)    None      Subjective:  Today, patient was seen and examined at bedside.  Denies overt back pain today.  Denies any nausea vomiting shortness of breath cough or fever.  Objective: Vitals:   12/29/20 0700 12/29/20 0900  BP: (!) 144/77 (!) 148/96  Pulse: 67 87  Resp: 17 (!) 21  Temp:    SpO2: 95% 95%   No intake or output data in the 24 hours ending 12/29/20 0913 There were no vitals filed for this visit. There is no height or weight on file to calculate BMI.   Physical Exam:  GENERAL: Patient is alert awake and oriented. Not in obvious distress. HENT: No scleral pallor or icterus. Pupils equally reactive to light. Oral mucosa is moist NECK: is supple, no gross swelling noted. CHEST: Clear to auscultation. No crackles or wheezes.  Diminished breath sounds bilaterally. CVS: S1 and S2 heard, no murmur. Regular rate and rhythm.  ABDOMEN: Soft, non-tender, bowel sounds are present. EXTREMITIES: No edema.  Decreased sensation over the right leg below the knee.  Decreased strength on the right leg. CNS: Cranial nerves are intact.  Moves extremities.  Gait could not be tested. SKIN: warm and dry without rashes.  Data Review: I have personally reviewed the following laboratory data and studies,  CBC: Recent Labs  Lab 12/27/20 1553  WBC 8.5  HGB 13.9  HCT 43.6  MCV 93.6  PLT 673   Basic Metabolic Panel: Recent Labs  Lab 12/27/20 1553  NA 140  K 4.3  CL 105  CO2 30  GLUCOSE 108*  BUN 17  CREATININE  0.60  CALCIUM 9.7   Liver Function Tests: Recent Labs  Lab 12/27/20 1553  AST 24  ALT 18  ALKPHOS 64  BILITOT 0.6  PROT 6.6  ALBUMIN 3.8   No results for input(s): LIPASE, AMYLASE in the last 168 hours. No results for input(s): AMMONIA in the last 168 hours. Cardiac Enzymes: No results for input(s): CKTOTAL, CKMB, CKMBINDEX, TROPONINI in the last 168 hours. BNP (last 3 results) No results for input(s): BNP in the last 8760 hours.  ProBNP (last 3 results) No results for input(s): PROBNP in the last 8760 hours.  CBG: Recent Labs  Lab 12/27/20 1546  GLUCAP 110*   Recent Results (from the past 240 hour(s))  Resp Panel by RT-PCR (Flu A&B, Covid) Nasopharyngeal Swab     Status: None   Collection Time: 12/28/20 12:34 AM   Specimen: Nasopharyngeal Swab; Nasopharyngeal(NP) swabs in vial transport medium  Result Value Ref Range Status   SARS Coronavirus 2 by RT PCR NEGATIVE NEGATIVE Final    Comment: (NOTE) SARS-CoV-2 target nucleic acids are NOT DETECTED.  The SARS-CoV-2 RNA is generally detectable in upper respiratory specimens during the acute phase of infection. The lowest concentration of SARS-CoV-2 viral copies this assay can detect is 138 copies/mL. A negative result does not preclude SARS-Cov-2 infection and should not be used as the sole basis for treatment or other patient management decisions. A negative result may occur with  improper specimen collection/handling, submission of specimen other than nasopharyngeal swab, presence of viral mutation(s) within the areas targeted by this assay, and inadequate number of viral copies(<138 copies/mL). A negative result must be combined with clinical observations, patient history, and epidemiological information. The expected result is Negative.  Fact Sheet for Patients:  EntrepreneurPulse.com.au  Fact Sheet for Healthcare Providers:  IncredibleEmployment.be  This test is no t yet  approved or cleared by the [place] FDA and  has been authorized for detection and/or diagnosis of SARS-CoV-2 by FDA under an Emergency Use Authorization (EUA). This EUA will remain  in effect (meaning this test can be used) for the duration of the COVID-19 declaration under Section 564(b)(1) of the Act, 21 U.S.C.section 360bbb-3(b)(1), unless the authorization is terminated  or revoked sooner.       Influenza A by PCR NEGATIVE NEGATIVE Final   Influenza B by PCR NEGATIVE NEGATIVE Final    Comment: (NOTE) The Xpert Xpress SARS-CoV-2/FLU/RSV plus assay is intended as an aid in the diagnosis of influenza from Nasopharyngeal swab specimens and should not be used as a sole basis for treatment. Nasal washings and aspirates are unacceptable for Xpert Xpress SARS-CoV-2/FLU/RSV testing.  Fact Sheet for Patients: EntrepreneurPulse.com.au  Fact Sheet for Healthcare Providers: IncredibleEmployment.be  This test is not yet approved or cleared by the [place] FDA and has been authorized for detection and/or diagnosis of SARS-CoV-2 by FDA under an Emergency Use Authorization (EUA). This EUA will remain in effect (meaning this test can be used) for the duration of the COVID-19 declaration under Section 564(b)(1) of the Act, 21 U.S.C. section 360bbb-3(b)(1), unless the authorization is terminated or revoked.  Performed at  [facility], [place] [address] [place], [place] [address]      Studies: CT Angio Head W or Wo Contrast  Result Date: 12/28/2020 CLINICAL DATA:  Neuro deficit, stroke suspected EXAM: CT ANGIOGRAPHY HEAD AND NECK TECHNIQUE: Multidetector CT imaging of the head and neck was performed using the standard protocol during bolus administration of intravenous contrast. Multiplanar CT image reconstructions and MIPs were obtained to evaluate the vascular anatomy. Carotid stenosis measurements (when applicable) are obtained utilizing  NASCET criteria, using the distal internal carotid diameter as the denominator. CONTRAST:  57mL OMNIPAQUE IOHEXOL 350 MG/ML SOLN COMPARISON:  CT head 12/27/2020 FINDINGS: CT HEAD FINDINGS Brain: No evidence of acute abnormality, including acute infarct, hemorrhage, hydrocephalus, or mass lesion. Periventricular white matter changes, likely the sequela of chronic small vessel ischemic disease. No extra-axial collection. Vascular: No hyperdense vessel.  Please see CTA findings below. Skull: Normal. Negative for fracture or focal lesion. Sinuses: Imaged portions are clear. Status post bilateral lens replacements. Orbits: The mastoids are well aerated. Review of the MIP images confirms the above findings CTA NECK FINDINGS Aortic arch: Standard branching. Imaged portion shows no evidence of aneurysm or dissection. No significant stenosis of the major arch vessel origins. Right carotid system: No evidence of dissection, stenosis (50% or greater) or occlusion. Left carotid system: No evidence of dissection, stenosis (50% or greater) or occlusion. Vertebral arteries: Codominant. No evidence of dissection, stenosis (50% or greater) or occlusion. Skeleton: No acute osseous abnormality. Other neck: Multiple subcentimeter hypoattenuating nodules in the thyroid, which is heterogeneously enhancing. Otherwise negative. Upper chest: No focal pulmonary opacity or pleural effusion. Review of the MIP images confirms the above findings CTA HEAD FINDINGS Anterior circulation: Both internal carotid arteries are patent to the termini, without stenosis or other abnormality. Calcifications in the left greater than right cavernous carotids without focal narrowing. Aplastic or hypoplastic right A1, with a large patent left A1. Normal anterior communicating artery. Anterior cerebral arteries are patent to their distal aspects. No M1 stenosis or occlusion. Normal MCA bifurcations. Distal MCA branches perfused and symmetric. Posterior  circulation: Vertebral arteries widely patent to the vertebrobasilar junction without stenosis. Posterior inferior cerebral arteries patent bilaterally. Basilar patent to its distal aspect. Superior cerebral arteries patent bilaterally. Aplastic right P1. Fetal origin of the right PCA. PCAs well perfused to their distal aspects without stenosis. Relative diminutive but patent left posterior communicating artery. Venous sinuses: As permitted by contrast timing, patent. Anatomic variants: None significant Review of the MIP images confirms the above findings IMPRESSION: 1. No acute intracranial process. 2. No hemodynamically significant stenosis in the neck. 3. No intracranial large vessel occlusion or significant stenosis. Electronically Signed   By: Merilyn Baba M.D.   On: 12/28/2020 12:47   CT Head Wo Contrast  Result Date: 12/27/2020 CLINICAL DATA:  Mental status change, unknown cause EXAM: CT HEAD WITHOUT CONTRAST TECHNIQUE: Contiguous axial images were obtained from the base of the skull through the vertex without intravenous contrast. COMPARISON:  MR head 12/01/2018 BRAIN: BRAIN Patchy and confluent areas of decreased attenuation are noted throughout the deep and periventricular white matter of the cerebral hemispheres bilaterally, compatible with chronic microvascular ischemic disease. No evidence of large-territorial acute infarction. No parenchymal hemorrhage. No mass lesion. No extra-axial collection. No mass effect or midline shift. No hydrocephalus. Basilar cisterns are patent. Vascular: No hyperdense vessel. Atherosclerotic calcifications are present within the cavernous internal carotid arteries. Skull: No acute fracture or focal lesion. Sinuses/Orbits: Paranasal sinuses and mastoid air cells  are clear. Bilateral lens replacement. Otherwise orbits are unremarkable. Other: None. IMPRESSION: No acute intracranial abnormality. Electronically Signed   By: Iven Finn M.D.   On: 12/27/2020 16:09    CT Angio Neck W and/or Wo Contrast  Result Date: 12/28/2020 CLINICAL DATA:  Neuro deficit, stroke suspected EXAM: CT ANGIOGRAPHY HEAD AND NECK TECHNIQUE: Multidetector CT imaging of the head and neck was performed using the standard protocol during bolus administration of intravenous contrast. Multiplanar CT image reconstructions and MIPs were obtained to evaluate the vascular anatomy. Carotid stenosis measurements (when applicable) are obtained utilizing NASCET criteria, using the distal internal carotid diameter as the denominator. CONTRAST:  39mL OMNIPAQUE IOHEXOL 350 MG/ML SOLN COMPARISON:  CT head 12/27/2020 FINDINGS: CT HEAD FINDINGS Brain: No evidence of acute abnormality, including acute infarct, hemorrhage, hydrocephalus, or mass lesion. Periventricular white matter changes, likely the sequela of chronic small vessel ischemic disease. No extra-axial collection. Vascular: No hyperdense vessel.  Please see CTA findings below. Skull: Normal. Negative for fracture or focal lesion. Sinuses: Imaged portions are clear. Status post bilateral lens replacements. Orbits: The mastoids are well aerated. Review of the MIP images confirms the above findings CTA NECK FINDINGS Aortic arch: Standard branching. Imaged portion shows no evidence of aneurysm or dissection. No significant stenosis of the major arch vessel origins. Right carotid system: No evidence of dissection, stenosis (50% or greater) or occlusion. Left carotid system: No evidence of dissection, stenosis (50% or greater) or occlusion. Vertebral arteries: Codominant. No evidence of dissection, stenosis (50% or greater) or occlusion. Skeleton: No acute osseous abnormality. Other neck: Multiple subcentimeter hypoattenuating nodules in the thyroid, which is heterogeneously enhancing. Otherwise negative. Upper chest: No focal pulmonary opacity or pleural effusion. Review of the MIP images confirms the above findings CTA HEAD FINDINGS Anterior circulation:  Both internal carotid arteries are patent to the termini, without stenosis or other abnormality. Calcifications in the left greater than right cavernous carotids without focal narrowing. Aplastic or hypoplastic right A1, with a large patent left A1. Normal anterior communicating artery. Anterior cerebral arteries are patent to their distal aspects. No M1 stenosis or occlusion. Normal MCA bifurcations. Distal MCA branches perfused and symmetric. Posterior circulation: Vertebral arteries widely patent to the vertebrobasilar junction without stenosis. Posterior inferior cerebral arteries patent bilaterally. Basilar patent to its distal aspect. Superior cerebral arteries patent bilaterally. Aplastic right P1. Fetal origin of the right PCA. PCAs well perfused to their distal aspects without stenosis. Relative diminutive but patent left posterior communicating artery. Venous sinuses: As permitted by contrast timing, patent. Anatomic variants: None significant Review of the MIP images confirms the above findings IMPRESSION: 1. No acute intracranial process. 2. No hemodynamically significant stenosis in the neck. 3. No intracranial large vessel occlusion or significant stenosis. Electronically Signed   By: Merilyn Baba M.D.   On: 12/28/2020 12:47   MR BRAIN W WO CONTRAST  Result Date: 12/28/2020 CLINICAL DATA:  Neuro deficit, acute, stroke suspected; slurred speech EXAM: MRI HEAD WITHOUT AND WITH CONTRAST TECHNIQUE: Multiplanar, multiecho pulse sequences of the brain and surrounding structures were obtained without and with intravenous contrast. CONTRAST:  33mL GADAVIST GADOBUTROL 1 MMOL/ML IV SOLN COMPARISON:  October 2020 FINDINGS: Brain: There is no acute infarction or intracranial hemorrhage. There is no intracranial mass, mass effect, or edema. There is no hydrocephalus or extra-axial fluid collection. Ventricles and sulci are within normal limits in size and configuration. Patchy and confluent areas of T2  hyperintensity in the supratentorial and pontine white matter are nonspecific but  probably reflect similar moderate chronic microvascular ischemic changes. A focus of susceptibility associated with T2 hyperintensity is again noted in the right frontal white matter likely reflecting gliosis and chronic blood products. No abnormal enhancement. Vascular: Major vessel flow voids at the skull base are preserved. Skull and upper cervical spine: Normal marrow signal is preserved. Sinuses/Orbits: Paranasal sinus mucosal thickening. Bilateral lens replacements. Other: Sella is unremarkable.  Mastoid air cells are clear. IMPRESSION: No evidence of recent infarction, hemorrhage, or mass. No abnormal enhancement. Similar moderate chronic microvascular ischemic changes. Electronically Signed   By: Macy Mis M.D.   On: 12/28/2020 09:57      Flora Lipps, MD  [facility] 12/29/2020  If 7PM-7AM, please contact night-coverage

## 2020-12-29 NOTE — ED Notes (Signed)
Pt resting on stretcher with eyes closed, respirations even and unlabored. Pt given ice water per request. No acute changes noted. Lights off, side rails up x2, call bell within reach, purewick connected and draining. Will continue to monitor.

## 2020-12-29 NOTE — ED Notes (Signed)
C/o nausea only. Alert, NAD, calm, interactive. Back from echo, pending MRI. Requests something for claustrophobia.

## 2020-12-29 NOTE — Progress Notes (Signed)
   12/29/20 1500  OT Visit Information  Last OT Received On 12/29/20  Assistance Needed +1 (2 for walk away from bed and chair follow)  Reason Eval/Treat Not Completed Other (comment). Pt imminently transferring from ED to upstairs per nursing. Plan to re-attempt, likely tomorrow.   History of Present Illness 80 yo female with onset of increasing weakness, gait dysfunction and worsening back pain was admitted to hosp on 11/3 with slurred speech and confusion.  Has new RLE numbness and new need for RW.  Cleared by MRI for stroke suspicions.  PMHx:  cervical facet syndrome, depression, TLIF lumbar spine, retinal detachment surgery, GERD, HA, kidney stones, HTN, sciatic neuritis,    Tyrone Schimke, OT Acute Rehabilitation Services Pager: [phone] Office: [phone]

## 2020-12-29 NOTE — ED Notes (Signed)
Per MRI: MRI expected at 1300-1315. Ativan ordered.

## 2020-12-29 NOTE — ED Notes (Signed)
MRI and echo called for pt at same time. Pt to MRI. To get echo afterward.

## 2020-12-29 NOTE — Progress Notes (Signed)
[facility] night cross cover note:  I was contacted by RN, who conveyed the patient's request for resumption of home prn percocet for chronic pain syndrome in the absence of any new pain source.  Following my chart review, including review of most recent [facility] progress note, it appears that, in addition to aforementioned prn Percocet, that she is also on fentanyl patch as outpatient, which has also been held thus far.  It appears that initially noted acute metabolic encephalopathy has improved.  The patient noted to now be awake alert and oriented. VS appear stable, without any evidence of hypotension.  Additionally, renal function appears at baseline.  I subsequently resumed home prn Percocet and also placed order for prn Narcan, while continuing to hold home fentanyl patch.     Babs Bertin, DO Hospitalist

## 2020-12-29 NOTE — ED Notes (Signed)
Neuro checks expire at 1400, pt placement notified.

## 2020-12-29 NOTE — ED Notes (Signed)
Pending MRI in next hour, pt in echo at this time.

## 2020-12-29 NOTE — Progress Notes (Signed)
  Echocardiogram 2D Echocardiogram has been performed.  Alexandria Mclaughlin F 12/29/2020, 5:00 PM

## 2020-12-30 DIAGNOSIS — M48061 Spinal stenosis, lumbar region without neurogenic claudication: Principal | ICD-10-CM

## 2020-12-30 LAB — BASIC METABOLIC PANEL
Anion gap: 6 (ref 5–15)
BUN: 24 mg/dL — ABNORMAL HIGH (ref 8–23)
CO2: 26 mmol/L (ref 22–32)
Calcium: 9.4 mg/dL (ref 8.9–10.3)
Chloride: 105 mmol/L (ref 98–111)
Creatinine, Ser: 0.82 mg/dL (ref 0.44–1.00)
GFR, Estimated: >60 mL/min (ref >60)
Glucose, Bld: 131 mg/dL — ABNORMAL HIGH (ref 70–99)
Potassium: 3.7 mmol/L (ref 3.5–5.1)
Sodium: 137 mmol/L (ref 135–145)

## 2020-12-30 LAB — CBC
HCT: 44.2 % (ref 36.0–46.0)
Hemoglobin: 14.2 g/dL (ref 12.0–15.0)
MCH: 29.7 pg (ref 26.0–34.0)
MCHC: 32.1 g/dL (ref 30.0–36.0)
MCV: 92.5 fL (ref 80.0–100.0)
Platelets: 219 10*3/uL (ref 150–400)
RBC: 4.78 MIL/uL (ref 3.87–5.11)
RDW: 12.8 % (ref 11.5–15.5)
WBC: 9.5 10*3/uL (ref 4.0–10.5)
nRBC: 0 % (ref 0.0–0.2)

## 2020-12-30 LAB — MAGNESIUM: Magnesium: 1.9 mg/dL (ref 1.7–2.4)

## 2020-12-30 MED ORDER — POLYETHYLENE GLYCOL 3350 17 G PO PACK
17.0000 g | PACK | Freq: Every day | ORAL | Status: DC
  Administered 2020-12-30 – 2021-01-01 (×3): 17 g via ORAL
  Filled 2020-12-30 (×3): qty 1

## 2020-12-30 NOTE — Progress Notes (Signed)
PROGRESS NOTE  Alexandria Mclaughlin NUU:[id number] DOB: 03/06/1940 DOA: 12/27/2020 PCP: Merrilee Seashore, MD   LOS: 2 days   Brief narrative:  Alexandria Mclaughlin is a 80 y.o. female with past medical history of lumbar stenosis status post surgery, chronic back pain on high-dose narcotics, hypertension presented to hospital with difficulty ambulating slurred speech and confusion.  Patient does have fentanyl patch oxycodone and gabapentin at baseline and is able to walk on her own.  There was no change in her medications.  On this presentation patient had been having shuffling gait wobbly gait with numbness below the knee on the right side which was new.  Patient was also noted to be having confusion and slurred speech, so patient was brought into the hospital.  Patient was then admitted to hospital for further evaluation and treatment.    Assessment/Plan:  Active Problems:   AMS (altered mental status)   Impaired ambulation  Acute on chronic ambulatory dysfunction Suspected recurrent/flareup of lumbar stenosis.  TIA on the differential so neurology was consulted who recommended TIA work-up and echocardiogram.  MRI of the brain did not show any acute findings except for moderate chronic microvascular ischemic changes..  Physical therapy has seen the patient and recommended skilled nursing facility placement. Lipid profile reviewed.  Hemoglobin A1c of 6.0.  CTA head and neck without any large vessel occlusion.  She had MRI of the lumbosacral spine on 12/29/2020 which showed lumbar spondylosis and degenerative disc disease causing moderate to prominent central narrowing of the thecal sac at L4-L5 and remote to 40% compression fracture over the L1.  Patient was seen by Dr. Venetia Constable in the past for her spinal surgery.  Patient is against any spinal intervention at this time.  We will continue with physical therapy.   Acute metabolic encephalopathy Resolved at baseline.  No leukocytosis or fever.  Vitamin B12 was  239.  COVID and influenza was negative.  Urine drug screen was negative   Essential hypertension Continue quinapril.  Blood pressure appears to be stable.   Anxiety/depression Continue Cymbalta, gabapentin   Disposition.  At this time, patient has been seen by physical therapy recommended skilled nursing facility placement.  DVT prophylaxis: enoxaparin (LOVENOX) injection 40 mg Start: 12/28/20 1415  Code Status: Full code  Family Communication:   I again tried to reach the patient's daughter Ms. Benjamine Mola on the phone number listed but was unable to reach her  Status is: Inpatient  Remains inpatient appropriate because:  need for skilled nursing facility  Consultants: Neurology as per admitting provider  Procedures: None  Anti-infectives:  None  Anti-infectives (From admission, onward)    None      Subjective:  Today, patient was seen and examined at bedside.  Patient feels okay has been lower back pain with weakness of the right lower extremity no shooting pain.  Denies any cough, fever, chills or rigor.  Objective: Vitals:   12/30/20 0406 12/30/20 0812  BP: 115/64 131/86  Pulse: 80 (!) 108  Resp: 18   Temp: 98.6 F (37 C)   SpO2: 100% 98%    Intake/Output Summary (Last 24 hours) at 12/30/2020 3474 Last data filed at 12/29/2020 2024 Gross per 24 hour  Intake 120 ml  Output 800 ml  Net -680 ml   There were no vitals filed for this visit. There is no height or weight on file to calculate BMI.   Physical Exam:  GENERAL: Patient is alert awake and oriented. Not in obvious distress. HENT: No  scleral pallor or icterus. Pupils equally reactive to light. Oral mucosa is moist NECK: is supple, no gross swelling noted. CHEST: Clear to auscultation. No crackles or wheezes.  Diminished breath sounds bilaterally. CVS: S1 and S2 heard, no murmur. Regular rate and rhythm.  ABDOMEN: Soft, non-tender, bowel sounds are present. EXTREMITIES: No edema.  Decreased  strength on the right leg with diminished sensation  CNS: Cranial nerves are intact.  Moves extremities.  Gait could not be tested. SKIN: warm and dry without rashes.  Data Review: I have personally reviewed the following laboratory data and studies,  CBC: Recent Labs  Lab 12/27/20 1553 12/30/20 0014  WBC 8.5 9.5  HGB 13.9 14.2  HCT 43.6 44.2  MCV 93.6 92.5  PLT 193 888    Basic Metabolic Panel: Recent Labs  Lab 12/27/20 1553 12/30/20 0014  NA 140 137  K 4.3 3.7  CL 105 105  CO2 30 26  GLUCOSE 108* 131*  BUN 17 24*  CREATININE 0.60 0.82  CALCIUM 9.7 9.4  MG  --  1.9    Liver Function Tests: Recent Labs  Lab 12/27/20 1553  AST 24  ALT 18  ALKPHOS 64  BILITOT 0.6  PROT 6.6  ALBUMIN 3.8    No results for input(s): LIPASE, AMYLASE in the last 168 hours. No results for input(s): AMMONIA in the last 168 hours. Cardiac Enzymes: No results for input(s): CKTOTAL, CKMB, CKMBINDEX, TROPONINI in the last 168 hours. BNP (last 3 results) No results for input(s): BNP in the last 8760 hours.  ProBNP (last 3 results) No results for input(s): PROBNP in the last 8760 hours.  CBG: Recent Labs  Lab 12/27/20 1546  GLUCAP 110*    Recent Results (from the past 240 hour(s))  Resp Panel by RT-PCR (Flu A&B, Covid) Nasopharyngeal Swab     Status: None   Collection Time: 12/28/20 12:34 AM   Specimen: Nasopharyngeal Swab; Nasopharyngeal(NP) swabs in vial transport medium  Result Value Ref Range Status   SARS Coronavirus 2 by RT PCR NEGATIVE NEGATIVE Final    Comment: (NOTE) SARS-CoV-2 target nucleic acids are NOT DETECTED.  The SARS-CoV-2 RNA is generally detectable in upper respiratory specimens during the acute phase of infection. The lowest concentration of SARS-CoV-2 viral copies this assay can detect is 138 copies/mL. A negative result does not preclude SARS-Cov-2 infection and should not be used as the sole basis for treatment or other patient management  decisions. A negative result may occur with  improper specimen collection/handling, submission of specimen other than nasopharyngeal swab, presence of viral mutation(s) within the areas targeted by this assay, and inadequate number of viral copies(<138 copies/mL). A negative result must be combined with clinical observations, patient history, and epidemiological information. The expected result is Negative.  Fact Sheet for Patients:  EntrepreneurPulse.com.au  Fact Sheet for Healthcare Providers:  IncredibleEmployment.be  This test is no t yet approved or cleared by the [place] FDA and  has been authorized for detection and/or diagnosis of SARS-CoV-2 by FDA under an Emergency Use Authorization (EUA). This EUA will remain  in effect (meaning this test can be used) for the duration of the COVID-19 declaration under Section 564(b)(1) of the Act, 21 U.S.C.section 360bbb-3(b)(1), unless the authorization is terminated  or revoked sooner.       Influenza A by PCR NEGATIVE NEGATIVE Final   Influenza B by PCR NEGATIVE NEGATIVE Final    Comment: (NOTE) The Xpert Xpress SARS-CoV-2/FLU/RSV plus assay is intended as an aid in  the diagnosis of influenza from Nasopharyngeal swab specimens and should not be used as a sole basis for treatment. Nasal washings and aspirates are unacceptable for Xpert Xpress SARS-CoV-2/FLU/RSV testing.  Fact Sheet for Patients: EntrepreneurPulse.com.au  Fact Sheet for Healthcare Providers: IncredibleEmployment.be  This test is not yet approved or cleared by the [place] FDA and has been authorized for detection and/or diagnosis of SARS-CoV-2 by FDA under an Emergency Use Authorization (EUA). This EUA will remain in effect (meaning this test can be used) for the duration of the COVID-19 declaration under Section 564(b)(1) of the Act, 21 U.S.C. section 360bbb-3(b)(1), unless the  authorization is terminated or revoked.  Performed at [facility], [place] [address] [place], [place] [address]       Studies: CT Angio Head W or Wo Contrast  Result Date: 12/28/2020 CLINICAL DATA:  Neuro deficit, stroke suspected EXAM: CT ANGIOGRAPHY HEAD AND NECK TECHNIQUE: Multidetector CT imaging of the head and neck was performed using the standard protocol during bolus administration of intravenous contrast. Multiplanar CT image reconstructions and MIPs were obtained to evaluate the vascular anatomy. Carotid stenosis measurements (when applicable) are obtained utilizing NASCET criteria, using the distal internal carotid diameter as the denominator. CONTRAST:  16mL OMNIPAQUE IOHEXOL 350 MG/ML SOLN COMPARISON:  CT head 12/27/2020 FINDINGS: CT HEAD FINDINGS Brain: No evidence of acute abnormality, including acute infarct, hemorrhage, hydrocephalus, or mass lesion. Periventricular white matter changes, likely the sequela of chronic small vessel ischemic disease. No extra-axial collection. Vascular: No hyperdense vessel.  Please see CTA findings below. Skull: Normal. Negative for fracture or focal lesion. Sinuses: Imaged portions are clear. Status post bilateral lens replacements. Orbits: The mastoids are well aerated. Review of the MIP images confirms the above findings CTA NECK FINDINGS Aortic arch: Standard branching. Imaged portion shows no evidence of aneurysm or dissection. No significant stenosis of the major arch vessel origins. Right carotid system: No evidence of dissection, stenosis (50% or greater) or occlusion. Left carotid system: No evidence of dissection, stenosis (50% or greater) or occlusion. Vertebral arteries: Codominant. No evidence of dissection, stenosis (50% or greater) or occlusion. Skeleton: No acute osseous abnormality. Other neck: Multiple subcentimeter hypoattenuating nodules in the thyroid, which is heterogeneously enhancing. Otherwise negative. Upper chest: No focal  pulmonary opacity or pleural effusion. Review of the MIP images confirms the above findings CTA HEAD FINDINGS Anterior circulation: Both internal carotid arteries are patent to the termini, without stenosis or other abnormality. Calcifications in the left greater than right cavernous carotids without focal narrowing. Aplastic or hypoplastic right A1, with a large patent left A1. Normal anterior communicating artery. Anterior cerebral arteries are patent to their distal aspects. No M1 stenosis or occlusion. Normal MCA bifurcations. Distal MCA branches perfused and symmetric. Posterior circulation: Vertebral arteries widely patent to the vertebrobasilar junction without stenosis. Posterior inferior cerebral arteries patent bilaterally. Basilar patent to its distal aspect. Superior cerebral arteries patent bilaterally. Aplastic right P1. Fetal origin of the right PCA. PCAs well perfused to their distal aspects without stenosis. Relative diminutive but patent left posterior communicating artery. Venous sinuses: As permitted by contrast timing, patent. Anatomic variants: None significant Review of the MIP images confirms the above findings IMPRESSION: 1. No acute intracranial process. 2. No hemodynamically significant stenosis in the neck. 3. No intracranial large vessel occlusion or significant stenosis. Electronically Signed   By: Merilyn Baba M.D.   On: 12/28/2020 12:47   CT Angio Neck W and/or Wo Contrast  Result Date: 12/28/2020 CLINICAL DATA:  Neuro deficit, stroke suspected EXAM: CT ANGIOGRAPHY HEAD AND NECK TECHNIQUE: Multidetector CT imaging of the head and neck was performed using the standard protocol during bolus administration of intravenous contrast. Multiplanar CT image reconstructions and MIPs were obtained to evaluate the vascular anatomy. Carotid stenosis measurements (when applicable) are obtained utilizing NASCET criteria, using the distal internal carotid diameter as the denominator. CONTRAST:   46mL OMNIPAQUE IOHEXOL 350 MG/ML SOLN COMPARISON:  CT head 12/27/2020 FINDINGS: CT HEAD FINDINGS Brain: No evidence of acute abnormality, including acute infarct, hemorrhage, hydrocephalus, or mass lesion. Periventricular white matter changes, likely the sequela of chronic small vessel ischemic disease. No extra-axial collection. Vascular: No hyperdense vessel.  Please see CTA findings below. Skull: Normal. Negative for fracture or focal lesion. Sinuses: Imaged portions are clear. Status post bilateral lens replacements. Orbits: The mastoids are well aerated. Review of the MIP images confirms the above findings CTA NECK FINDINGS Aortic arch: Standard branching. Imaged portion shows no evidence of aneurysm or dissection. No significant stenosis of the major arch vessel origins. Right carotid system: No evidence of dissection, stenosis (50% or greater) or occlusion. Left carotid system: No evidence of dissection, stenosis (50% or greater) or occlusion. Vertebral arteries: Codominant. No evidence of dissection, stenosis (50% or greater) or occlusion. Skeleton: No acute osseous abnormality. Other neck: Multiple subcentimeter hypoattenuating nodules in the thyroid, which is heterogeneously enhancing. Otherwise negative. Upper chest: No focal pulmonary opacity or pleural effusion. Review of the MIP images confirms the above findings CTA HEAD FINDINGS Anterior circulation: Both internal carotid arteries are patent to the termini, without stenosis or other abnormality. Calcifications in the left greater than right cavernous carotids without focal narrowing. Aplastic or hypoplastic right A1, with a large patent left A1. Normal anterior communicating artery. Anterior cerebral arteries are patent to their distal aspects. No M1 stenosis or occlusion. Normal MCA bifurcations. Distal MCA branches perfused and symmetric. Posterior circulation: Vertebral arteries widely patent to the vertebrobasilar junction without stenosis.  Posterior inferior cerebral arteries patent bilaterally. Basilar patent to its distal aspect. Superior cerebral arteries patent bilaterally. Aplastic right P1. Fetal origin of the right PCA. PCAs well perfused to their distal aspects without stenosis. Relative diminutive but patent left posterior communicating artery. Venous sinuses: As permitted by contrast timing, patent. Anatomic variants: None significant Review of the MIP images confirms the above findings IMPRESSION: 1. No acute intracranial process. 2. No hemodynamically significant stenosis in the neck. 3. No intracranial large vessel occlusion or significant stenosis. Electronically Signed   By: Merilyn Baba M.D.   On: 12/28/2020 12:47   MR BRAIN W WO CONTRAST  Result Date: 12/28/2020 CLINICAL DATA:  Neuro deficit, acute, stroke suspected; slurred speech EXAM: MRI HEAD WITHOUT AND WITH CONTRAST TECHNIQUE: Multiplanar, multiecho pulse sequences of the brain and surrounding structures were obtained without and with intravenous contrast. CONTRAST:  59mL GADAVIST GADOBUTROL 1 MMOL/ML IV SOLN COMPARISON:  October 2020 FINDINGS: Brain: There is no acute infarction or intracranial hemorrhage. There is no intracranial mass, mass effect, or edema. There is no hydrocephalus or extra-axial fluid collection. Ventricles and sulci are within normal limits in size and configuration. Patchy and confluent areas of T2 hyperintensity in the supratentorial and pontine white matter are nonspecific but probably reflect similar moderate chronic microvascular ischemic changes. A focus of susceptibility associated with T2 hyperintensity is again noted in the right frontal white matter likely reflecting gliosis and chronic blood products. No abnormal enhancement. Vascular: Major vessel flow voids at the skull base are preserved. Skull  and upper cervical spine: Normal marrow signal is preserved. Sinuses/Orbits: Paranasal sinus mucosal thickening. Bilateral lens replacements.  Other: Sella is unremarkable.  Mastoid air cells are clear. IMPRESSION: No evidence of recent infarction, hemorrhage, or mass. No abnormal enhancement. Similar moderate chronic microvascular ischemic changes. Electronically Signed   By: Macy Mis M.D.   On: 12/28/2020 09:57   MR LUMBAR SPINE WO CONTRAST  Result Date: 12/29/2020 CLINICAL DATA:  Spinal stenosis, difficulty ambulating. EXAM: MRI LUMBAR SPINE WITHOUT CONTRAST TECHNIQUE: Multiplanar, multisequence MR imaging of the lumbar spine was performed. No intravenous contrast was administered. COMPARISON:  None. FINDINGS: Segmentation: The lowest lumbar type non-rib-bearing vertebra is labeled as L5. Alignment:  3 mm grade 1 anterolisthesis at L4-5. Vertebrae: Chronic 40% compression fracture along the superior endplate at L1 with 3 mm of chronic posterior bony retropulsion. This fracture was subacute back on 04/02/2018. Since that time there is only been minimal reduction in overall vertebral height from about 17 mm previously to about 15 mm today. Subtle accentuated T2 marrow signal along the margin of the superior endplate Posterolateral rod and pedicle screw fixation at L4-5. Solid interbody fusion at L5-S1. Right posterior iliac bone graft harvest site. No new or acute fracture identified. Small hemangioma along the inferior endplate of T06. Small hemangioma eccentric to the left in the L3 vertebral body. Conus medullaris and cauda equina: Conus extends to the L1 level. Conus and cauda equina appear normal. Paraspinal and other soft tissues: Unremarkable Disc levels: T12-L1: No impingement. Midline posterior bony retropulsion and disc bulge noted. L1-2: Unremarkable. L2-3: No impingement.  Mild disc bulge. L3-4: Moderate central narrowing of the thecal sac and mild left foraminal stenosis as well as mild bilateral subarticular lateral recess stenosis due to disc bulge, facet arthropathy, ligamentum flavum redundancy. Substantially worsened compared  to the prior exam suggesting adjacent segment disease. L4-5: Moderate to prominent central narrowing of the thecal sac due to disc uncovering facet arthropathy. Prior foraminal impingement at this level is no longer present. Interval posterolateral rod and pedicle screw fixation. L5-S1: No impingement.  Solid interbody fusion. IMPRESSION: 1. Lumbar spondylosis and degenerative disc disease, causing moderate to prominent central narrowing of the thecal sac at L4-5, and moderate impingement at L3-4 as detailed above. 2. Remote 40% compression fracture along the superior endplate at L1, although with some very subtle accentuated T2 marrow signal along the superior endplate margin which could indicate some minimal subacute subsidence (or simply could be from degenerative endplate findings). Electronically Signed   By: Van Clines M.D.   On: 12/29/2020 14:51   ECHOCARDIOGRAM COMPLETE  Result Date: 12/29/2020    ECHOCARDIOGRAM REPORT   Patient Name:   ALAYNE ESTRELLA Sligh Date of Exam: 12/29/2020 Medical Rec #:  [id number]   Height:       63.0 in Accession #:    [id number]  Weight:       143.2 lb Date of Birth:  03-10-1940   BSA:          1.678 m Patient Age:    48 years    BP:           122/77 mmHg Patient Gender: F           HR:           78 bpm. Exam Location:  Inpatient Procedure: 2D Echo, Cardiac Doppler and Color Doppler Indications:    TIA  History:        Patient has prior history of Echocardiogram examinations, most  recent 02/14/2015.  Sonographer:    Merrie Roof RDCS Referring Phys: [id number] [place]  1. Small hyperdynamic ventrical possible small mid cavitray gradient . Left ventricular ejection fraction, by estimation, is 60 to 65%. The left ventricle has normal function. The left ventricle has no regional wall motion abnormalities. Left ventricular diastolic parameters are consistent with Grade I diastolic dysfunction (impaired relaxation).  2. Right ventricular systolic  function is normal. The right ventricular size is normal.  3. The mitral valve is normal in structure. Trivial mitral valve regurgitation. No evidence of mitral stenosis.  4. The aortic valve is tricuspid. Aortic valve regurgitation is not visualized. Mild to moderate aortic valve sclerosis/calcification is present, without any evidence of aortic stenosis.  5. The inferior vena cava is normal in size with greater than 50% respiratory variability, suggesting right atrial pressure of 3 mmHg. FINDINGS  Left Ventricle: Small hyperdynamic ventrical possible small mid cavitray gradient. Left ventricular ejection fraction, by estimation, is 60 to 65%. The left ventricle has normal function. The left ventricle has no regional wall motion abnormalities. The  left ventricular internal cavity size was small. There is no left ventricular hypertrophy. Left ventricular diastolic parameters are consistent with Grade I diastolic dysfunction (impaired relaxation). Right Ventricle: The right ventricular size is normal. No increase in right ventricular wall thickness. Right ventricular systolic function is normal. Left Atrium: Left atrial size was normal in size. Right Atrium: Right atrial size was normal in size. Pericardium: There is no evidence of pericardial effusion. Mitral Valve: The mitral valve is normal in structure. Trivial mitral valve regurgitation. No evidence of mitral valve stenosis. Tricuspid Valve: The tricuspid valve is normal in structure. Tricuspid valve regurgitation is not demonstrated. No evidence of tricuspid stenosis. Aortic Valve: The aortic valve is tricuspid. Aortic valve regurgitation is not visualized. Mild to moderate aortic valve sclerosis/calcification is present, without any evidence of aortic stenosis. Aortic valve mean gradient measures 7.0 mmHg. Aortic valve peak gradient measures 12.4 mmHg. Aortic valve area, by VTI measures 2.01 cm. Pulmonic Valve: The pulmonic valve was normal in structure.  Pulmonic valve regurgitation is not visualized. No evidence of pulmonic stenosis. Aorta: The aortic root is normal in size and structure. Venous: The inferior vena cava is normal in size with greater than 50% respiratory variability, suggesting right atrial pressure of 3 mmHg. IAS/Shunts: No atrial level shunt detected by color flow Doppler.  LEFT VENTRICLE PLAX 2D LVIDd:         3.70 cm   Diastology LVIDs:         2.60 cm   LV e' medial:    3.81 cm/s LV PW:         1.00 cm   LV E/e' medial:  15.4 LV IVS:        0.90 cm   LV e' lateral:   5.98 cm/s LVOT diam:     1.90 cm   LV E/e' lateral: 9.8 LV SV:         63 LV SV Index:   38 LVOT Area:     2.84 cm  RIGHT VENTRICLE RV Basal diam:  3.20 cm LEFT ATRIUM             Index        RIGHT ATRIUM           Index LA diam:        3.60 cm 2.15 cm/m   RA Area:     10.80 cm LA Vol (A2C):  64.3 ml 38.33 ml/m  RA Volume:   18.50 ml  11.03 ml/m LA Vol (A4C):   40.4 ml 24.08 ml/m LA Biplane Vol: 51.9 ml 30.94 ml/m  AORTIC VALVE AV Area (Vmax):    2.40 cm AV Area (Vmean):   2.17 cm AV Area (VTI):     2.01 cm AV Vmax:           176.00 cm/s AV Vmean:          120.000 cm/s AV VTI:            0.314 m AV Peak Grad:      12.4 mmHg AV Mean Grad:      7.0 mmHg LVOT Vmax:         149.00 cm/s LVOT Vmean:        92.000 cm/s LVOT VTI:          0.223 m LVOT/AV VTI ratio: 0.71  AORTA Ao Root diam: 3.20 cm Ao Asc diam:  3.50 cm MITRAL VALVE               TRICUSPID VALVE MV Area (PHT): 2.99 cm    TR Peak grad:   27.2 mmHg MV Decel Time: 254 msec    TR Vmax:        261.00 cm/s MV E velocity: 58.70 cm/s MV A velocity: 90.70 cm/s  SHUNTS MV E/A ratio:  0.65        Systemic VTI:  0.22 m                            Systemic Diam: 1.90 cm Jenkins Rouge MD Electronically signed by Jenkins Rouge MD Signature Date/Time: 12/29/2020/5:54:00 PM    Final       Flora Lipps, MD  [facility] 12/30/2020  If 7PM-7AM, please contact night-coverage

## 2020-12-30 NOTE — Evaluation (Signed)
Occupational Therapy Evaluation Patient Details Name: Alexandria Mclaughlin MRN: [id number] DOB: 11-05-1940 Today's Date: 12/30/2020   History of Present Illness Alexandria Mclaughlin is a 80 y.o. female with PMHx: lumbar stenosis s/p surgery, chronic back pain on high-dose narcotics, HTN presented to hospital with difficulty ambulating, slurred speech and confusion. MRI Brain negative. ?TIA or lumbar stenoisis flare-up?   Clinical Impression   Pt PTA: Pt was living with son, but reports independence with ADL and mobility. Pt currently exhibiting R sided weakness, coordination deficits and sensation deficits.  Pt stand pivot simulated to recliner with ModA. Pt performing tasks in seated with set-upA. Pt requires set-upA to maxA overall for ADL. HR s/p exertion 111 BPM; slight dizziness with initial sitting upright- resolved quickly. Pt reports back pain 5/10 and usually controlled with medications. Pt would greatly benefit from continued OT skilled services acutely.     Recommendations for follow up therapy are one component of a multi-disciplinary discharge planning process, led by the attending physician.  Recommendations may be updated based on patient status, additional functional criteria and insurance authorization.   Follow Up Recommendations  Acute inpatient rehab (3hours/day)    Assistance Recommended at Discharge Frequent or constant Supervision/Assistance  Functional Status Assessment  Patient has had a recent decline in their functional status and demonstrates the ability to make significant improvements in function in a reasonable and predictable amount of time.  Equipment Recommendations  [facility]    Recommendations for Other Services Rehab consult     Precautions / Restrictions Precautions Precautions: Back;Other (comment) Precaution Comments: back for comfort only; log roll Restrictions Weight Bearing Restrictions: No      Mobility Bed Mobility Overal bed mobility: Needs Assistance Bed  Mobility: Rolling;Sidelying to Sit Rolling: Supervision Sidelying to sit: Supervision       General bed mobility comments: pt performing log roll; HOB slightly elevated as she has adjustable bed at home and using b/l railings    Transfers Overall transfer level: Needs assistance Equipment used: 1 person hand held assist Transfers: Stand Pivot Transfers Sit to Stand: Mod assist;From elevated surface Stand pivot transfers: Mod assist;From elevated surface         General transfer comment: Mod A for R side weakness and able to take pivoting steps to recliner      Balance Overall balance assessment: Needs assistance Sitting-balance support: Feet supported;Single extremity supported Sitting balance-Leahy Scale: Fair     Standing balance support: Single extremity supported;During functional activity Standing balance-Leahy Scale: Poor Standing balance comment: R side weakness, leaning on L side                           ADL either performed or assessed with clinical judgement   ADL Overall ADL's : Needs assistance/impaired Eating/Feeding: Set up;Sitting   Grooming: Set up;Sitting Grooming Details (indicate cue type and reason): rolled in recliner to sink Upper Body Bathing: Minimal assistance;Sitting   Lower Body Bathing: Moderate assistance;Sitting/lateral leans;Bed level   Upper Body Dressing : Minimal assistance;Sitting   Lower Body Dressing: Moderate assistance;Sitting/lateral leans;Sit to/from stand;Bed level   Toilet Transfer: Moderate assistance;Stand-pivot Toilet Transfer Details (indicate cue type and reason): stand pivot simulated to recliner exhibiting R sided weakness Toileting- Clothing Manipulation and Hygiene: Maximal assistance;Sitting/lateral lean;Sit to/from stand Toileting - Clothing Manipulation Details (indicate cue type and reason): using pure wick     Functional mobility during ADLs: Moderate assistance;Cueing for safety;Cueing for  sequencing General ADL Comments: Pt using BUEs  for all grooming tasks at sink, favoring LUE as RUE with weakness and coordination deficits. Pt performing tasks in seated with set-upA. Pt requires set-upA to maxA overall.     Vision Baseline Vision/History: 1 Wears glasses Ability to See in Adequate Light: 0 Adequate Patient Visual Report: No change from baseline;Other (comment) (diplopia at baseline without glasses; has corrective lense) Vision Assessment?: Yes;No apparent visual deficits Eye Alignment: Within Functional Limits Ocular Range of Motion: Within Functional Limits Alignment/Gaze Preference: Within Defined Limits     Perception     Praxis      Pertinent Vitals/Pain Pain Assessment: 0-10 Pain Score: 5  Pain Location: low back Pain Descriptors / Indicators: Discomfort;Sore Pain Intervention(s): Monitored during session;Repositioned     Hand Dominance Right   Extremity/Trunk Assessment Upper Extremity Assessment Upper Extremity Assessment: Generalized weakness;RUE deficits/detail RUE Deficits / Details: 3-/5 strength, corodination deficits, 2/5 grip strength RUE Sensation: decreased light touch RUE Coordination: decreased fine motor;decreased gross motor   Lower Extremity Assessment Lower Extremity Assessment: RLE deficits/detail;Defer to PT evaluation RLE Deficits / Details: Weakness RLE; decreased sensation RLE Sensation: decreased light touch RLE Coordination: decreased gross motor   Cervical / Trunk Assessment Cervical / Trunk Assessment: Back Surgery (2020 sx)   Communication Communication Communication: No difficulties   Cognition Arousal/Alertness: Awake/alert Behavior During Therapy: WFL for tasks assessed/performed Overall Cognitive Status: Within Functional Limits for tasks assessed                                 General Comments: slight delay in answers, but reports HOH     General Comments  HR s/p exertion 111 BPM; slight  dizziness with initial sitting upright- resolved quickly.    Exercises     Shoulder Instructions      Home Living Family/patient expects to be discharged to:: Private residence Living Arrangements: Children Available Help at Discharge: Family;Available 24 hours/day Type of Home: House Home Access: Level entry     Home Layout: One level     Bathroom Shower/Tub: Other (comment) (walk in tub)   Bathroom Toilet: Handicapped height     Home Equipment: Conservation officer, nature (2 wheels);Cane - single point;Shower seat - built in;Grab bars - toilet          Prior Functioning/Environment Prior Level of Function : Independent/Modified Independent   Mobility (Cognitive): Set up cues         Mobility Comments: recently using RW with weakness, but independence prior. ADLs Comments: Reports independence with ADL and mobility  usign RW fo rmobility        OT Problem List: Decreased strength;Decreased activity tolerance;Impaired balance (sitting and/or standing);Decreased coordination;Decreased safety awareness;Impaired UE functional use;Decreased knowledge of use of DME or AE      OT Treatment/Interventions: Self-care/ADL training;Therapeutic exercise;Neuromuscular education;Energy conservation;DME and/or AE instruction;Therapeutic activities;Visual/perceptual remediation/compensation;Patient/family education;Balance training;Cognitive remediation/compensation    OT Goals(Current goals can be found in the care plan section) Acute Rehab OT Goals Patient Stated Goal: to get stronger OT Goal Formulation: With patient Time For Goal Achievement: 01/13/21 Potential to Achieve Goals: Good ADL Goals Pt Will Transfer to Toilet: (P) with min assist;stand pivot transfer;bedside commode Pt Will Perform Toileting - Clothing Manipulation and hygiene: (P) with min assist;sitting/lateral leans;sit to/from stand Additional ADL Goal #1: (P) Pt will perform OOB ADL tasks with minA overall with good  safety awareness. Additional ADL Goal #2: (P) Pt will participate in higher level cognitive tasks to further assess  any decline.  OT Frequency: Min 2X/week   Barriers to D/C:            Co-evaluation              AM-PAC OT "6 Clicks" Daily Activity     Outcome Measure Help from another person eating meals?: None Help from another person taking care of personal grooming?: A Little Help from another person toileting, which includes using toliet, bedpan, or urinal?: A Lot Help from another person bathing (including washing, rinsing, drying)?: A Lot Help from another person to put on and taking off regular upper body clothing?: A Little Help from another person to put on and taking off regular lower body clothing?: A Lot 6 Click Score: 16   End of Session Equipment Utilized During Treatment: Gait belt Nurse Communication: Mobility status  Activity Tolerance: Patient tolerated treatment well Patient left: in chair;with call bell/phone within reach;with chair alarm set  OT Visit Diagnosis: Unsteadiness on feet (R26.81);Muscle weakness (generalized) (M62.81);Hemiplegia and hemiparesis                Time: 4782-9562 OT Time Calculation (min): 35 min Charges:  OT General Charges $OT Visit: 1 Visit OT Evaluation $OT Eval Moderate Complexity: 1 Mod OT Treatments $Self Care/Home Management : 8-22 mins  Jefferey Pica, OTR/L Acute Rehabilitation Services Pager: [phone] Office: [place] 12/30/2020, 9:05 AM

## 2020-12-30 NOTE — TOC Initial Note (Signed)
Transition of Care [facility]) - Initial/Assessment Note    Patient Details  Name: Alexandria Mclaughlin MRN: [id number] Date of Birth: Mar 25, 1940  [facility]) CM/SW Contact:    Geralynn Ochs, LCSW Phone Number: 12/30/2020, 11:26 AM  Clinical Narrative:         CSW met with patient to discuss recommendation for SNF. Patient in agreement, no facility preference. Patient has been fully vaccinated. CSW to fax out referral and will follow with bed offers.          Expected Discharge Plan: Skilled Nursing Facility Barriers to Discharge: Continued Medical Work up, Ship broker   Patient Goals and CMS Choice Patient states their goals for this hospitalization and ongoing recovery are:: to get rehab CMS Medicare.gov Compare Post Acute Care list provided to:: Patient Choice offered to / list presented to : Patient  Expected Discharge Plan and Services Expected Discharge Plan: [place] Choice: [place] arrangements for the past 2 months: Single Family Home                                      Prior Living Arrangements/Services Living arrangements for the past 2 months: Single Family Home Lives with:: Adult Children Patient language and need for interpreter reviewed:: No Do you feel safe going back to the place where you live?: Yes      Need for Family Participation in Patient Care: No (Comment) Care giver support system in place?: No (comment)   Criminal Activity/Legal Involvement Pertinent to Current Situation/Hospitalization: No - Comment as needed  Activities of Daily Living      Permission Sought/Granted Permission sought to share information with : Facility Art therapist granted to share information with : Yes, Verbal Permission Granted     Permission granted to share info w AGENCY: SNF        Emotional Assessment Appearance:: Appears stated  age Attitude/Demeanor/Rapport: Engaged Affect (typically observed): Appropriate Orientation: : Oriented to Self, Oriented to Place, Oriented to  Time, Oriented to Situation Alcohol / Substance Use: Not Applicable Psych Involvement: No (comment)  Admission diagnosis:  Right leg weakness [R29.898] Altered mental status, unspecified altered mental status type [R41.82] Impaired ambulation [R26.2] Patient Active Problem List   Diagnosis Date Noted   AMS (altered mental status) 12/28/2020   Impaired ambulation 12/28/2020   Right leg weakness    Biceps tendonitis on left 08/18/2018   Rotator cuff tendonitis, left 06/08/2018   Lumbar radiculopathy 04/26/2018   Lumbar radicular pain 07/29/2016   Myoclonus 09/19/2015   Therapeutic opioid induced constipation 07/25/2015   White matter changes 02/07/2015   Essential hypertension 02/07/2015   HLD (hyperlipidemia) 02/07/2015   Low back pain 02/07/2015   BPPV (benign paroxysmal positional vertigo) 02/07/2015   Abnormality of gait 02/07/2015   Lumbar facet arthropathy 07/05/2014   Acquired unequal leg length on left 08/08/2011   Lumbar post-laminectomy syndrome 06/11/2011   Sacroiliac joint dysfunction 06/11/2011   PCP:  Merrilee Seashore, MD Pharmacy:   [id number] [address], [place] [address] Phone: [phone] Fax: [place], [place] [address] Phone: [phone] Fax: [phone]     Social Determinants of Health (SDOH) Interventions    Readmission Risk Interventions No flowsheet data  found.

## 2020-12-30 NOTE — NC FL2 (Signed)
[place] LEVEL OF CARE SCREENING TOOL     IDENTIFICATION  Patient Name: Alexandria Mclaughlin Birthdate: 1940/03/04 Sex: female Admission Date (Current Location): 12/27/2020  [facility] and [place] Number:  Herbalist and Address:  [facility] [place], [place] [address] [place], [place] [address]      Provider Number: [id number]  Attending Physician Name and Address:  Flora Lipps, MD  Relative Name and Phone Number:       Current Level of Care: Hospital Recommended Level of Care: [place] Prior Approval Number:    Date Approved/Denied:   PASRR Number: [id number]  Discharge Plan: SNF    Current Diagnoses: Patient Active Problem List   Diagnosis Date Noted   AMS (altered mental status) 12/28/2020   Impaired ambulation 12/28/2020   Right leg weakness    Biceps tendonitis on left 08/18/2018   Rotator cuff tendonitis, left 06/08/2018   Lumbar radiculopathy 04/26/2018   Lumbar radicular pain 07/29/2016   Myoclonus 09/19/2015   Therapeutic opioid induced constipation 07/25/2015   White matter changes 02/07/2015   Essential hypertension 02/07/2015   HLD (hyperlipidemia) 02/07/2015   Low back pain 02/07/2015   BPPV (benign paroxysmal positional vertigo) 02/07/2015   Abnormality of gait 02/07/2015   Lumbar facet arthropathy 07/05/2014   Acquired unequal leg length on left 08/08/2011   Lumbar post-laminectomy syndrome 06/11/2011   Sacroiliac joint dysfunction 06/11/2011    Orientation RESPIRATION BLADDER Height & Weight     Self, Time, Situation, Place  Normal Incontinent, Continent Weight:   Height:     BEHAVIORAL SYMPTOMS/MOOD NEUROLOGICAL BOWEL NUTRITION STATUS      Continent Diet (heart healthy)  AMBULATORY STATUS COMMUNICATION OF NEEDS Skin   Limited Assist Verbally Normal                       Personal Care Assistance Level of Assistance  Bathing, Feeding, Dressing Bathing Assistance: Limited  assistance Feeding assistance: Limited assistance Dressing Assistance: Limited assistance     Functional Limitations Info  Sight, Hearing Sight Info: Impaired (glasses) Hearing Info: Impaired (hearing aids)      [place]  PT (By licensed PT), OT (By licensed OT)     PT Frequency: 5x/wk OT Frequency: 5x/wk            Contractures Contractures Info: Not present    Additional Factors Info  Code Status, Allergies, Psychotropic Code Status Info: Full Allergies Info: NKA Psychotropic Info: Cymbalta 30mg  daily         Current Medications (12/30/2020):  This is the current hospital active medication list Current Facility-Administered Medications  Medication Dose Route Frequency Provider Last Rate Last Admin   acetaminophen (TYLENOL) tablet 650 mg  650 mg Oral Q4H PRN Wynetta Fines T, MD       Or   acetaminophen (TYLENOL) 160 MG/5ML solution 650 mg  650 mg Per Tube Q4H PRN Wynetta Fines T, MD       Or   acetaminophen (TYLENOL) suppository 650 mg  650 mg Rectal Q4H PRN Wynetta Fines T, MD       clonazePAM [facility] [place]) tablet 0.5 mg  0.5 mg Oral QHS PRN Wynetta Fines T, MD       DULoxetine (CYMBALTA) DR capsule 30 mg  30 mg Oral Daily Wynetta Fines T, MD   30 mg at 12/30/20 0840   enoxaparin (LOVENOX) injection 40 mg  40 mg Subcutaneous Daily Lequita Halt, MD  40 mg at 12/30/20 0840   gabapentin (NEURONTIN) capsule 600 mg  600 mg Oral TID Wynetta Fines T, MD   600 mg at 12/30/20 0839   melatonin tablet 5 mg  5 mg Oral QHS PRN Wynetta Fines T, MD   5 mg at 12/29/20 2248   naloxone [facility]) injection 0.4 mg  0.4 mg Intravenous PRN Howerter, Justin B, DO       ondansetron (ZOFRAN) injection 4 mg  4 mg Intravenous Q6H PRN Wynetta Fines T, MD   4 mg at 12/29/20 0959   oxyCODONE-acetaminophen (PERCOCET/ROXICET) 5-325 MG per tablet 2 tablet  2 tablet Oral Q6H PRN Howerter, Justin B, DO   2 tablet at 12/30/20 0839   quinapril (ACCUPRIL) tablet 20 mg  20 mg Oral Daily Wynetta Fines T,  MD   20 mg at 12/30/20 0844   rosuvastatin (CRESTOR) tablet 20 mg  20 mg Oral Daily Wynetta Fines T, MD   20 mg at 12/30/20 0840   senna-docusate (Senokot-S) tablet 1 tablet  1 tablet Oral QHS PRN Lequita Halt, MD       traZODone (DESYREL) tablet 100 mg  100 mg Oral QHS Wynetta Fines T, MD   100 mg at 12/29/20 2057     Discharge Medications: Please see discharge summary for a list of discharge medications.  Relevant Imaging Results:  Relevant Lab Results:   Additional Information SS#: [id number]  Geralynn Ochs, LCSW

## 2020-12-30 NOTE — Plan of Care (Signed)
Pt is alert oriented x 4. Pt c/o decreased sensation to bilateral legs. Pt is able to move all extremities. Pt in bed resting. Pt has chronic back pain, prn pain medication given.   Problem: Education: Goal: Knowledge of General Education information will improve Description: Including pain rating scale, medication(s)/side effects and non-pharmacologic comfort measures Outcome: Progressing   Problem: Health Behavior/Discharge Planning: Goal: Ability to manage health-related needs will improve Outcome: Progressing   Problem: Clinical Measurements: Goal: Ability to maintain clinical measurements within normal limits will improve Outcome: Progressing Goal: Will remain free from infection Outcome: Progressing Goal: Diagnostic test results will improve Outcome: Progressing Goal: Respiratory complications will improve Outcome: Progressing Goal: Cardiovascular complication will be avoided Outcome: Progressing   Problem: Activity: Goal: Risk for activity intolerance will decrease Outcome: Progressing   Problem: Nutrition: Goal: Adequate nutrition will be maintained Outcome: Progressing   Problem: Coping: Goal: Level of anxiety will decrease Outcome: Progressing   Problem: Elimination: Goal: Will not experience complications related to bowel motility Outcome: Progressing   Problem: Elimination: Goal: Will not experience complications related to bowel motility Outcome: Progressing Goal: Will not experience complications related to urinary retention Outcome: Progressing   Problem: Pain Managment: Goal: General experience of comfort will improve Outcome: Progressing   Problem: Safety: Goal: Ability to remain free from injury will improve Outcome: Progressing   Problem: Skin Integrity: Goal: Risk for impaired skin integrity will decrease Outcome: Progressing

## 2020-12-31 LAB — RESP PANEL BY RT-PCR (FLU A&B, COVID) ARPGX2
Influenza A by PCR: NEGATIVE
Influenza B by PCR: NEGATIVE
SARS Coronavirus 2 by RT PCR: NEGATIVE

## 2020-12-31 MED ORDER — PSYLLIUM 95 % PO PACK
1.0000 | PACK | Freq: Every day | ORAL | Status: DC
  Administered 2020-12-31 – 2021-01-01 (×2): 1 via ORAL
  Filled 2020-12-31 (×2): qty 1

## 2020-12-31 NOTE — TOC Progression Note (Signed)
Transition of Care [facility]) - Progression Note    Patient Details  Name: Alexandria Mclaughlin MRN: [id number] Date of Birth: 06-07-1940  Transition of Care [facility]) CM/SW [place], [place] Phone Number: 12/31/2020, 11:51 AM  Clinical Narrative:   CSW met with patient and daughter at bedside to provide bed offers. Patient and daughter to review options available, CSW will follow up with them on choice.     Expected Discharge Plan: [place] Barriers to Discharge: Continued Medical Work up, Ship broker  Expected Discharge Plan and Services Expected Discharge Plan: [place] Choice: [place] arrangements for the past 2 months: Single Family Home                                       Social Determinants of Health (SDOH) Interventions    Readmission Risk Interventions No flowsheet data found.

## 2020-12-31 NOTE — Progress Notes (Signed)
Applied 2L nasal cannula for patients sats in 70-80's during sleep.  Patient states she has sleep apnea

## 2020-12-31 NOTE — Progress Notes (Signed)
Inpatient Rehab Admissions Coordinator Note:   Per SW patient was screened for CIR candidacy by Bethel Born, CCC-SLP. At this time, pt does not appear to demonstrate medical necessity to justify a CIR admission. AC will not not pursue a rehab consult for this pt. Recommend other rehab venues to be pursued. Please contact me any with questions.Gayland Curry, [place], [place] Admissions Coordinator [phone] 12/31/20 7:06 PM

## 2020-12-31 NOTE — Evaluation (Signed)
Speech Language Pathology Evaluation Patient Details Name: Alexandria Mclaughlin MRN: [id number] DOB: [birth year] Today's Date: 12/31/2020 Time: 6213-0865 SLP Time Calculation (min) (ACUTE ONLY): 14 min  Problem List:  Patient Active Problem List   Diagnosis Date Noted   AMS (altered mental status) 12/28/2020   Impaired ambulation 12/28/2020   Right leg weakness    Biceps tendonitis on left 08/18/2018   Rotator cuff tendonitis, left 06/08/2018   Lumbar radiculopathy 04/26/2018   Lumbar radicular pain 07/29/2016   Myoclonus 09/19/2015   Therapeutic opioid induced constipation 07/25/2015   White matter changes 02/07/2015   Essential hypertension 02/07/2015   HLD (hyperlipidemia) 02/07/2015   Low back pain 02/07/2015   BPPV (benign paroxysmal positional vertigo) 02/07/2015   Abnormality of gait 02/07/2015   Lumbar facet arthropathy 07/05/2014   Acquired unequal leg length on left 08/08/2011   Lumbar post-laminectomy syndrome 06/11/2011   Sacroiliac joint dysfunction 06/11/2011   Past Medical History:  Past Medical History:  Diagnosis Date   Cervical facet syndrome    Depression    Disorders of sacrum    Enthesopathy of hip region    GERD (gastroesophageal reflux disease)    Headache    History of kidney stones    Hypertension    Sciatic neuritis    Synovitis and tenosynovitis    Vision abnormalities    Past Surgical History:  Past Surgical History:  Procedure Laterality Date   ABDOMINAL HYSTERECTOMY     BACK SURGERY     2011   [place]     x2   RETINAL DETACHMENT SURGERY  9/13   TRANSFORAMINAL LUMBAR INTERBODY FUSION W/ MIS 1 LEVEL Right 04/26/2018   Procedure: Right Lumbar four-five Minimally invasive Transforaminal lumbar interbody fusion;  Surgeon: Judith Part, MD;  Location: [place];  Service: Neurosurgery;  Laterality: Right;   HPI:  80 yo female with onset of increasing weakness, gait dysfunction and worsening back pain was  admitted to hosp on 11/3 with slurred speech and confusion.  Has new RLE numbness and new need for RW.  Cleared by MRI for stroke suspicions.  PMHx:  cervical facet syndrome, depression, TLIF lumbar spine, retinal detachment surgery, GERD, HA, kidney stones, HTN, sciatic neuritis,   Assessment / Plan / Recommendation Clinical Impression  Pt presents with baseline cognitive-communicative function notable for clear, fluent speech, functional short-term recall, normal verbal problem-solving; good ability to follow multi-step commands. Expressive/receptive language are WNL.  Reading WNL.  No SLP f/u needs - our service will sign off.    SLP Assessment  SLP Recommendation/Assessment: Patient does not need any further Speech [facility] SLP Visit Diagnosis: Cognitive communication deficit (R41.841)    Recommendations for follow up therapy are one component of a multi-disciplinary discharge planning process, led by the attending physician.  Recommendations may be updated based on patient status, additional functional criteria and insurance authorization.    Follow Up Recommendations  None    Frequency and Duration           SLP Evaluation Cognition  Overall Cognitive Status: Within Functional Limits for tasks assessed Arousal/Alertness: Awake/alert Orientation Level: Oriented X4 Attention: Selective Selective Attention: Appears intact Awareness: Appears intact Problem Solving: Appears intact Safety/Judgment: Appears intact       Comprehension  Auditory Comprehension Overall Auditory Comprehension: Appears within functional limits for tasks assessed Yes/No Questions: Within Functional Limits Commands: Within Functional Limits Visual Recognition/Discrimination Discrimination: Within Function Limits Reading Comprehension Reading Status: Within funtional  limits    Expression Expression Primary Mode of Expression: Verbal Verbal Expression Overall Verbal Expression:  Appears within functional limits for tasks assessed   Oral / Motor  Oral Motor/Sensory Function Overall Oral Motor/Sensory Function: Within functional limits Motor Speech Overall Motor Speech: Appears within functional limits for tasks assessed   GO                   Jonavin Seder L. Tivis Ringer, [place] CCC/SLP Acute Rehabilitation Services Office number [phone] Pager [phone]  Juan Quam Laurice 12/31/2020, 4:28 PM

## 2020-12-31 NOTE — Care Management Important Message (Signed)
Important Message  Patient Details  Name: Alexandria Mclaughlin MRN: [id number] Date of Birth: 06/22/1940   Medicare Important Message Given:  Yes     Orbie Pyo 12/31/2020, 3:32 PM

## 2020-12-31 NOTE — Progress Notes (Addendum)
PROGRESS NOTE  Alexandria Mclaughlin YKD:[id number] DOB: June 18, 1940 DOA: 12/27/2020 PCP: Merrilee Seashore, MD   LOS: 3 days   Brief narrative:  Alexandria Mclaughlin is a 80 y.o. female with past medical history of lumbar stenosis status post surgery, chronic back pain on high-dose narcotics, hypertension presented to hospital with difficulty ambulating slurred speech and confusion.  Patient does have fentanyl patch oxycodone and gabapentin at baseline and is able to walk on her own.  There was no change in her medications.  On this presentation, patient had been having shuffling gait wobbly gait with numbness below the knee on the right side which was new.  Patient was also noted to be having confusion and slurred speech, so patient was brought into the hospital.  Patient was then admitted to hospital for further evaluation and treatment.    Assessment/Plan:  Active Problems:   AMS (altered mental status)   Impaired ambulation  Acute on chronic ambulatory dysfunction Suspected recurrent/flareup of lumbar stenosis.  TIA on the differential so neurology was consulted who recommended TIA work-up and echocardiogram.  MRI of the brain did not show any acute findings except for moderate chronic microvascular ischemic changes..  Physical therapy has seen the patient and recommended skilled nursing facility placement. Lipid profile reviewed.  Hemoglobin A1c of 6.0.  CTA head and neck without any large vessel occlusion.  Patient had MRI of the lumbosacral spine on 12/29/2020 which showed lumbar spondylosis and degenerative disc disease causing moderate to prominent central narrowing of the thecal sac at L4-L5 and remote to 40% compression fracture over the L1.  Patient was seen by Dr. Venetia Constable in the past for her spinal surgery.  Patient is against any spinal intervention at this time.  We will continue with physical therapy.   Acute metabolic encephalopathy Resolved,  No leukocytosis or fever.  Vitamin B12 was 239.   COVID and influenza was negative.  Urine drug screen was negative   Essential hypertension Continue quinapril.  Latest blood pressure 154/81   Anxiety/depression Continue Cymbalta, gabapentin   Disposition.  At this time, patient has been seen by physical therapy who has recommended skilled nursing facility placement.  DVT prophylaxis: enoxaparin (LOVENOX) injection 40 mg Start: 12/28/20 1415  Code Status: Full code  Family Communication:   I tried to reach the patient's daughter but was unable to reach her again today.  Status is: Inpatient  Remains inpatient appropriate because:  need for skilled nursing facility  Consultants: Neurology -verbal consult  Procedures: None  Anti-infectives:  None  Anti-infectives (From admission, onward)    None      Subjective:  Today, patient was seen and examined at bedside.  Denies nausea vomiting shortness of breath cough or dyspnea.  Denies overt back pain.  Inquiring about physical therapy.  Feels constipated.  Objective: Vitals:   12/31/20 0458 12/31/20 0800  BP: (!) 142/74 (!) 154/81  Pulse: 83 71  Resp: 18 18  Temp: (!) 97.4 F (36.3 C) 98.2 F (36.8 C)  SpO2: 99% 100%    Intake/Output Summary (Last 24 hours) at 12/31/2020 1151 Last data filed at 12/31/2020 0500 Gross per 24 hour  Intake --  Output 200 ml  Net -200 ml    There were no vitals filed for this visit. There is no height or weight on file to calculate BMI.   Physical Exam:  General:  Average built, not in obvious distress HENT:   No scleral pallor or icterus noted. Oral mucosa is moist.  Chest:   Diminished breath sounds bilaterally. No crackles or wheezes.  CVS: S1 &S2 heard. No murmur.  Regular rate and rhythm. Abdomen: Soft, nontender, nondistended.  Bowel sounds are heard.   Extremities: No cyanosis, clubbing or edema.  Peripheral pulses are palpable. Psych: Alert, awake and oriented, normal mood CNS:  No cranial nerve deficits.  Decreased  strength of the right leg with decrease sensation, gait not tested. Skin: Warm and dry.  No rashes noted.   Data Review: I have personally reviewed the following laboratory data and studies,  CBC: Recent Labs  Lab 12/27/20 1553 12/30/20 0014  WBC 8.5 9.5  HGB 13.9 14.2  HCT 43.6 44.2  MCV 93.6 92.5  PLT 193 301    Basic Metabolic Panel: Recent Labs  Lab 12/27/20 1553 12/30/20 0014  NA 140 137  K 4.3 3.7  CL 105 105  CO2 30 26  GLUCOSE 108* 131*  BUN 17 24*  CREATININE 0.60 0.82  CALCIUM 9.7 9.4  MG  --  1.9    Liver Function Tests: Recent Labs  Lab 12/27/20 1553  AST 24  ALT 18  ALKPHOS 64  BILITOT 0.6  PROT 6.6  ALBUMIN 3.8    No results for input(s): LIPASE, AMYLASE in the last 168 hours. No results for input(s): AMMONIA in the last 168 hours. Cardiac Enzymes: No results for input(s): CKTOTAL, CKMB, CKMBINDEX, TROPONINI in the last 168 hours. BNP (last 3 results) No results for input(s): BNP in the last 8760 hours.  ProBNP (last 3 results) No results for input(s): PROBNP in the last 8760 hours.  CBG: Recent Labs  Lab 12/27/20 1546  GLUCAP 110*    Recent Results (from the past 240 hour(s))  Resp Panel by RT-PCR (Flu A&B, Covid) Nasopharyngeal Swab     Status: None   Collection Time: 12/28/20 12:34 AM   Specimen: Nasopharyngeal Swab; Nasopharyngeal(NP) swabs in vial transport medium  Result Value Ref Range Status   SARS Coronavirus 2 by RT PCR NEGATIVE NEGATIVE Final    Comment: (NOTE) SARS-CoV-2 target nucleic acids are NOT DETECTED.  The SARS-CoV-2 RNA is generally detectable in upper respiratory specimens during the acute phase of infection. The lowest concentration of SARS-CoV-2 viral copies this assay can detect is 138 copies/mL. A negative result does not preclude SARS-Cov-2 infection and should not be used as the sole basis for treatment or other patient management decisions. A negative result may occur with  improper specimen  collection/handling, submission of specimen other than nasopharyngeal swab, presence of viral mutation(s) within the areas targeted by this assay, and inadequate number of viral copies(<138 copies/mL). A negative result must be combined with clinical observations, patient history, and epidemiological information. The expected result is Negative.  Fact Sheet for Patients:  EntrepreneurPulse.com.au  Fact Sheet for Healthcare Providers:  IncredibleEmployment.be  This test is no t yet approved or cleared by the [place] FDA and  has been authorized for detection and/or diagnosis of SARS-CoV-2 by FDA under an Emergency Use Authorization (EUA). This EUA will remain  in effect (meaning this test can be used) for the duration of the COVID-19 declaration under Section 564(b)(1) of the Act, 21 U.S.C.section 360bbb-3(b)(1), unless the authorization is terminated  or revoked sooner.       Influenza A by PCR NEGATIVE NEGATIVE Final   Influenza B by PCR NEGATIVE NEGATIVE Final    Comment: (NOTE) The Xpert Xpress SARS-CoV-2/FLU/RSV plus assay is intended as an aid in the diagnosis of influenza from Nasopharyngeal  swab specimens and should not be used as a sole basis for treatment. Nasal washings and aspirates are unacceptable for Xpert Xpress SARS-CoV-2/FLU/RSV testing.  Fact Sheet for Patients: EntrepreneurPulse.com.au  Fact Sheet for Healthcare Providers: IncredibleEmployment.be  This test is not yet approved or cleared by the [place] FDA and has been authorized for detection and/or diagnosis of SARS-CoV-2 by FDA under an Emergency Use Authorization (EUA). This EUA will remain in effect (meaning this test can be used) for the duration of the COVID-19 declaration under Section 564(b)(1) of the Act, 21 U.S.C. section 360bbb-3(b)(1), unless the authorization is terminated or revoked.  Performed at [facility], [place] [address] [place], [place] [address]       Studies: MR LUMBAR SPINE WO CONTRAST  Result Date: 12/29/2020 CLINICAL DATA:  Spinal stenosis, difficulty ambulating. EXAM: MRI LUMBAR SPINE WITHOUT CONTRAST TECHNIQUE: Multiplanar, multisequence MR imaging of the lumbar spine was performed. No intravenous contrast was administered. COMPARISON:  None. FINDINGS: Segmentation: The lowest lumbar type non-rib-bearing vertebra is labeled as L5. Alignment:  3 mm grade 1 anterolisthesis at L4-5. Vertebrae: Chronic 40% compression fracture along the superior endplate at L1 with 3 mm of chronic posterior bony retropulsion. This fracture was subacute back on 04/02/2018. Since that time there is only been minimal reduction in overall vertebral height from about 17 mm previously to about 15 mm today. Subtle accentuated T2 marrow signal along the margin of the superior endplate Posterolateral rod and pedicle screw fixation at L4-5. Solid interbody fusion at L5-S1. Right posterior iliac bone graft harvest site. No new or acute fracture identified. Small hemangioma along the inferior endplate of O70. Small hemangioma eccentric to the left in the L3 vertebral body. Conus medullaris and cauda equina: Conus extends to the L1 level. Conus and cauda equina appear normal. Paraspinal and other soft tissues: Unremarkable Disc levels: T12-L1: No impingement. Midline posterior bony retropulsion and disc bulge noted. L1-2: Unremarkable. L2-3: No impingement.  Mild disc bulge. L3-4: Moderate central narrowing of the thecal sac and mild left foraminal stenosis as well as mild bilateral subarticular lateral recess stenosis due to disc bulge, facet arthropathy, ligamentum flavum redundancy. Substantially worsened compared to the prior exam suggesting adjacent segment disease. L4-5: Moderate to prominent central narrowing of the thecal sac due to disc uncovering facet arthropathy. Prior foraminal impingement at this level is  no longer present. Interval posterolateral rod and pedicle screw fixation. L5-S1: No impingement.  Solid interbody fusion. IMPRESSION: 1. Lumbar spondylosis and degenerative disc disease, causing moderate to prominent central narrowing of the thecal sac at L4-5, and moderate impingement at L3-4 as detailed above. 2. Remote 40% compression fracture along the superior endplate at L1, although with some very subtle accentuated T2 marrow signal along the superior endplate margin which could indicate some minimal subacute subsidence (or simply could be from degenerative endplate findings). Electronically Signed   By: Van Clines M.D.   On: 12/29/2020 14:51   ECHOCARDIOGRAM COMPLETE  Result Date: 12/29/2020    ECHOCARDIOGRAM REPORT   Patient Name:   Alexandria Mclaughlin Date of Exam: 12/29/2020 Medical Rec #:  [id number]   Height:       63.0 in Accession #:    [id number]  Weight:       143.2 lb Date of Birth:  1941/01/18   BSA:          1.678 m Patient Age:    75 years    BP:  122/77 mmHg Patient Gender: F           HR:           78 bpm. Exam Location:  Inpatient Procedure: 2D Echo, Cardiac Doppler and Color Doppler Indications:    TIA  History:        Patient has prior history of Echocardiogram examinations, most                 recent 02/14/2015.  Sonographer:    Merrie Roof RDCS Referring Phys: [id number] [place]  1. Small hyperdynamic ventrical possible small mid cavitray gradient . Left ventricular ejection fraction, by estimation, is 60 to 65%. The left ventricle has normal function. The left ventricle has no regional wall motion abnormalities. Left ventricular diastolic parameters are consistent with Grade I diastolic dysfunction (impaired relaxation).  2. Right ventricular systolic function is normal. The right ventricular size is normal.  3. The mitral valve is normal in structure. Trivial mitral valve regurgitation. No evidence of mitral stenosis.  4. The aortic valve is tricuspid. Aortic  valve regurgitation is not visualized. Mild to moderate aortic valve sclerosis/calcification is present, without any evidence of aortic stenosis.  5. The inferior vena cava is normal in size with greater than 50% respiratory variability, suggesting right atrial pressure of 3 mmHg. FINDINGS  Left Ventricle: Small hyperdynamic ventrical possible small mid cavitray gradient. Left ventricular ejection fraction, by estimation, is 60 to 65%. The left ventricle has normal function. The left ventricle has no regional wall motion abnormalities. The  left ventricular internal cavity size was small. There is no left ventricular hypertrophy. Left ventricular diastolic parameters are consistent with Grade I diastolic dysfunction (impaired relaxation). Right Ventricle: The right ventricular size is normal. No increase in right ventricular wall thickness. Right ventricular systolic function is normal. Left Atrium: Left atrial size was normal in size. Right Atrium: Right atrial size was normal in size. Pericardium: There is no evidence of pericardial effusion. Mitral Valve: The mitral valve is normal in structure. Trivial mitral valve regurgitation. No evidence of mitral valve stenosis. Tricuspid Valve: The tricuspid valve is normal in structure. Tricuspid valve regurgitation is not demonstrated. No evidence of tricuspid stenosis. Aortic Valve: The aortic valve is tricuspid. Aortic valve regurgitation is not visualized. Mild to moderate aortic valve sclerosis/calcification is present, without any evidence of aortic stenosis. Aortic valve mean gradient measures 7.0 mmHg. Aortic valve peak gradient measures 12.4 mmHg. Aortic valve area, by VTI measures 2.01 cm. Pulmonic Valve: The pulmonic valve was normal in structure. Pulmonic valve regurgitation is not visualized. No evidence of pulmonic stenosis. Aorta: The aortic root is normal in size and structure. Venous: The inferior vena cava is normal in size with greater than 50%  respiratory variability, suggesting right atrial pressure of 3 mmHg. IAS/Shunts: No atrial level shunt detected by color flow Doppler.  LEFT VENTRICLE PLAX 2D LVIDd:         3.70 cm   Diastology LVIDs:         2.60 cm   LV e' medial:    3.81 cm/s LV PW:         1.00 cm   LV E/e' medial:  15.4 LV IVS:        0.90 cm   LV e' lateral:   5.98 cm/s LVOT diam:     1.90 cm   LV E/e' lateral: 9.8 LV SV:         63 LV SV Index:   38  LVOT Area:     2.84 cm  RIGHT VENTRICLE RV Basal diam:  3.20 cm LEFT ATRIUM             Index        RIGHT ATRIUM           Index LA diam:        3.60 cm 2.15 cm/m   RA Area:     10.80 cm LA Vol (A2C):   64.3 ml 38.33 ml/m  RA Volume:   18.50 ml  11.03 ml/m LA Vol (A4C):   40.4 ml 24.08 ml/m LA Biplane Vol: 51.9 ml 30.94 ml/m  AORTIC VALVE AV Area (Vmax):    2.40 cm AV Area (Vmean):   2.17 cm AV Area (VTI):     2.01 cm AV Vmax:           176.00 cm/s AV Vmean:          120.000 cm/s AV VTI:            0.314 m AV Peak Grad:      12.4 mmHg AV Mean Grad:      7.0 mmHg LVOT Vmax:         149.00 cm/s LVOT Vmean:        92.000 cm/s LVOT VTI:          0.223 m LVOT/AV VTI ratio: 0.71  AORTA Ao Root diam: 3.20 cm Ao Asc diam:  3.50 cm MITRAL VALVE               TRICUSPID VALVE MV Area (PHT): 2.99 cm    TR Peak grad:   27.2 mmHg MV Decel Time: 254 msec    TR Vmax:        261.00 cm/s MV E velocity: 58.70 cm/s MV A velocity: 90.70 cm/s  SHUNTS MV E/A ratio:  0.65        Systemic VTI:  0.22 m                            Systemic Diam: 1.90 cm Jenkins Rouge MD Electronically signed by Jenkins Rouge MD Signature Date/Time: 12/29/2020/5:54:00 PM    Final       Flora Lipps, MD  [facility] 12/31/2020  If 7PM-7AM, please contact night-coverage

## 2021-01-01 DIAGNOSIS — R2689 Other abnormalities of gait and mobility: Secondary | ICD-10-CM | POA: Diagnosis not present

## 2021-01-01 DIAGNOSIS — R29898 Other symptoms and signs involving the musculoskeletal system: Secondary | ICD-10-CM | POA: Diagnosis not present

## 2021-01-01 DIAGNOSIS — G459 Transient cerebral ischemic attack, unspecified: Secondary | ICD-10-CM | POA: Diagnosis not present

## 2021-01-01 DIAGNOSIS — R269 Unspecified abnormalities of gait and mobility: Secondary | ICD-10-CM | POA: Diagnosis not present

## 2021-01-01 DIAGNOSIS — M48061 Spinal stenosis, lumbar region without neurogenic claudication: Secondary | ICD-10-CM | POA: Diagnosis not present

## 2021-01-01 DIAGNOSIS — E538 Deficiency of other specified B group vitamins: Secondary | ICD-10-CM | POA: Diagnosis not present

## 2021-01-01 DIAGNOSIS — R262 Difficulty in walking, not elsewhere classified: Secondary | ICD-10-CM | POA: Diagnosis not present

## 2021-01-01 DIAGNOSIS — G8929 Other chronic pain: Secondary | ICD-10-CM | POA: Diagnosis not present

## 2021-01-01 DIAGNOSIS — R4182 Altered mental status, unspecified: Secondary | ICD-10-CM | POA: Diagnosis not present

## 2021-01-01 DIAGNOSIS — R0902 Hypoxemia: Secondary | ICD-10-CM | POA: Diagnosis not present

## 2021-01-01 DIAGNOSIS — R531 Weakness: Secondary | ICD-10-CM | POA: Diagnosis not present

## 2021-01-01 DIAGNOSIS — G894 Chronic pain syndrome: Secondary | ICD-10-CM | POA: Diagnosis not present

## 2021-01-01 DIAGNOSIS — M6281 Muscle weakness (generalized): Secondary | ICD-10-CM | POA: Diagnosis not present

## 2021-01-01 DIAGNOSIS — G9341 Metabolic encephalopathy: Secondary | ICD-10-CM | POA: Diagnosis not present

## 2021-01-01 DIAGNOSIS — Z743 Need for continuous supervision: Secondary | ICD-10-CM | POA: Diagnosis not present

## 2021-01-01 DIAGNOSIS — I679 Cerebrovascular disease, unspecified: Secondary | ICD-10-CM | POA: Diagnosis not present

## 2021-01-01 DIAGNOSIS — I10 Essential (primary) hypertension: Secondary | ICD-10-CM | POA: Diagnosis not present

## 2021-01-01 MED ORDER — POLYETHYLENE GLYCOL 3350 17 G PO PACK
17.0000 g | PACK | Freq: Every day | ORAL | Status: DC | PRN
Start: 2021-01-01 — End: 2021-02-06

## 2021-01-01 MED ORDER — OXYCODONE-ACETAMINOPHEN 10-325 MG PO TABS: 1.0000 | ORAL_TABLET | Freq: Four times a day (QID) | ORAL | 0 refills | Status: DC | PRN

## 2021-01-01 MED ORDER — ASPIRIN-ACETAMINOPHEN-CAFFEINE 250-250-65 MG PO TABS
1.0000 | ORAL_TABLET | Freq: Four times a day (QID) | ORAL | Status: DC | PRN
  Administered 2021-01-01: 1 via ORAL
  Filled 2021-01-01 (×3): qty 1

## 2021-01-01 MED ORDER — CYANOCOBALAMIN 1000 MCG PO TABS: 1000.0000 ug | ORAL_TABLET | Freq: Every day | ORAL | Status: DC

## 2021-01-01 MED ORDER — VITAMIN B-12 1000 MCG PO TABS
1000.0000 ug | ORAL_TABLET | Freq: Every day | ORAL | Status: DC
  Administered 2021-01-01: 1000 ug via ORAL

## 2021-01-01 MED ORDER — PSYLLIUM 95 % PO PACK: 1.0000 | PACK | Freq: Every day | ORAL | Status: DC

## 2021-01-01 MED ORDER — CLONAZEPAM 0.5 MG PO TABS: 0.5000 mg | ORAL_TABLET | Freq: Every evening | ORAL | 0 refills | Status: DC | PRN

## 2021-01-01 NOTE — Progress Notes (Signed)
PROGRESS NOTE  Alexandria Mclaughlin NKN:[id number] DOB: 18-Nov-1940 DOA: 12/27/2020 PCP: Merrilee Seashore, MD   LOS: 4 days   Brief narrative:  Alexandria Mclaughlin is a 80 y.o. female with past medical history of lumbar stenosis status post surgery, chronic back pain on high-dose narcotics, hypertension presented to hospital with difficulty ambulating, slurred speech and confusion.  Patient does have fentanyl patch, oxycodone and gabapentin at baseline and is able to walk on her own.  There was no change in her medications.  On this presentation, patient had been having shuffling,wobbly gait with numbness below the knee on the right side which was new.  Patient was also noted to be having confusion and slurred speech, so patient was brought into the hospital.  Patient was then admitted to hospital for further evaluation and treatment.    After hospitalization, mentation has improved.  Currently awaiting for skilled nursing facility placement.  Assessment/Plan:  Active Problems:   AMS (altered mental status)   Impaired ambulation  Acute on chronic ambulatory dysfunction Suspected recurrent/flareup of lumbar stenosis.    MRI of the brain did not show any acute findings except for moderate chronic microvascular ischemic changes..  Physical therapy recommended skilled nursing facility placement. Lipid profile reviewed.  Hemoglobin A1c of 6.0.  CTA head and neck without any large vessel occlusion.  Patient had MRI of the lumbosacral spine on 12/29/2020 which showed lumbar spondylosis and degenerative disc disease causing moderate to prominent central narrowing of the thecal sac at L4-L5 and remote to 40% compression fracture over the L1.  Patient was seen by Dr. Venetia Constable in the past for her spinal surgery.  I had spoken with neurology who did not think that patient would need any neurosurgical intervention.  Patient is against any spinal intervention at this time.  We will continue with physical therapy.  She would  benefit from ongoing physical therapy.  Chronic back pain.  Patient is on Klonopin and duloxetine and gabapentin oxycodone currently.  Has had surgeries in the past.   Acute metabolic encephalopathy Resolved,  No leukocytosis or fever.  Vitamin B12 was 239.  COVID and influenza was negative.  Urine drug screen was negative.  We will add vitamin B12 p.o.  Patient was on multiple medications at home.   Essential hypertension Continue quinapril.   Anxiety/depression Continue Cymbalta, gabapentin   Disposition.  Awaiting for skilled nursing facility placement.  DVT prophylaxis: enoxaparin (LOVENOX) injection 40 mg Start: 12/28/20 1415  Code Status: Full code  Family Communication:  I again tried to reach the patient's daughter's number listed but was unable to reach her.  Status is: Inpatient   Remains inpatient appropriate because:  need for skilled nursing facility placement for physical therapy.  Consultants: Neurology -verbal consult  Procedures: None  Anti-infectives:  None  Anti-infectives (From admission, onward)    None      Subjective:  Today, patient was seen and examined at bedside.  Complains of mild numbness in the leg but overall stable.  Complains of mild headache.  Objective: Vitals:   01/01/21 0324 01/01/21 0700  BP: 124/61 (!) 167/93  Pulse: 63 92  Resp: 20 18  Temp: 98.4 F (36.9 C) 98.1 F (36.7 C)  SpO2: 100% 94%   No intake or output data in the 24 hours ending 01/01/21 1025  There were no vitals filed for this visit. There is no height or weight on file to calculate BMI.   Physical Exam:  General:  Average built, not in  obvious distress HENT:   No scleral pallor or icterus noted. Oral mucosa is moist.  Chest:  Clear breath sounds.  No crackles or wheezes.  CVS: S1 &S2 heard. No murmur.  Regular rate and rhythm. Abdomen: Soft, nontender, nondistended.  Bowel sounds are heard.   Extremities: No cyanosis, clubbing or edema.   Peripheral pulses are palpable. Psych: Alert, awake and oriented, normal mood CNS:  No cranial nerve deficits.  Decreased strength over the right leg Skin: Warm and dry.  No rashes noted.  Data Review: I have personally reviewed the following laboratory data and studies,  CBC: Recent Labs  Lab 12/27/20 1553 12/30/20 0014  WBC 8.5 9.5  HGB 13.9 14.2  HCT 43.6 44.2  MCV 93.6 92.5  PLT 193 341    Basic Metabolic Panel: Recent Labs  Lab 12/27/20 1553 12/30/20 0014  NA 140 137  K 4.3 3.7  CL 105 105  CO2 30 26  GLUCOSE 108* 131*  BUN 17 24*  CREATININE 0.60 0.82  CALCIUM 9.7 9.4  MG  --  1.9    Liver Function Tests: Recent Labs  Lab 12/27/20 1553  AST 24  ALT 18  ALKPHOS 64  BILITOT 0.6  PROT 6.6  ALBUMIN 3.8    No results for input(s): LIPASE, AMYLASE in the last 168 hours. No results for input(s): AMMONIA in the last 168 hours. Cardiac Enzymes: No results for input(s): CKTOTAL, CKMB, CKMBINDEX, TROPONINI in the last 168 hours. BNP (last 3 results) No results for input(s): BNP in the last 8760 hours.  ProBNP (last 3 results) No results for input(s): PROBNP in the last 8760 hours.  CBG: Recent Labs  Lab 12/27/20 1546  GLUCAP 110*    Recent Results (from the past 240 hour(s))  Resp Panel by RT-PCR (Flu A&B, Covid) Nasopharyngeal Swab     Status: None   Collection Time: 12/28/20 12:34 AM   Specimen: Nasopharyngeal Swab; Nasopharyngeal(NP) swabs in vial transport medium  Result Value Ref Range Status   SARS Coronavirus 2 by RT PCR NEGATIVE NEGATIVE Final    Comment: (NOTE) SARS-CoV-2 target nucleic acids are NOT DETECTED.  The SARS-CoV-2 RNA is generally detectable in upper respiratory specimens during the acute phase of infection. The lowest concentration of SARS-CoV-2 viral copies this assay can detect is 138 copies/mL. A negative result does not preclude SARS-Cov-2 infection and should not be used as the sole basis for treatment or other  patient management decisions. A negative result may occur with  improper specimen collection/handling, submission of specimen other than nasopharyngeal swab, presence of viral mutation(s) within the areas targeted by this assay, and inadequate number of viral copies(<138 copies/mL). A negative result must be combined with clinical observations, patient history, and epidemiological information. The expected result is Negative.  Fact Sheet for Patients:  EntrepreneurPulse.com.au  Fact Sheet for Healthcare Providers:  IncredibleEmployment.be  This test is no t yet approved or cleared by the [place] FDA and  has been authorized for detection and/or diagnosis of SARS-CoV-2 by FDA under an Emergency Use Authorization (EUA). This EUA will remain  in effect (meaning this test can be used) for the duration of the COVID-19 declaration under Section 564(b)(1) of the Act, 21 U.S.C.section 360bbb-3(b)(1), unless the authorization is terminated  or revoked sooner.       Influenza A by PCR NEGATIVE NEGATIVE Final   Influenza B by PCR NEGATIVE NEGATIVE Final    Comment: (NOTE) The Xpert Xpress SARS-CoV-2/FLU/RSV plus assay is intended as an  aid in the diagnosis of influenza from Nasopharyngeal swab specimens and should not be used as a sole basis for treatment. Nasal washings and aspirates are unacceptable for Xpert Xpress SARS-CoV-2/FLU/RSV testing.  Fact Sheet for Patients: EntrepreneurPulse.com.au  Fact Sheet for Healthcare Providers: IncredibleEmployment.be  This test is not yet approved or cleared by the [place] FDA and has been authorized for detection and/or diagnosis of SARS-CoV-2 by FDA under an Emergency Use Authorization (EUA). This EUA will remain in effect (meaning this test can be used) for the duration of the COVID-19 declaration under Section 564(b)(1) of the Act, 21 U.S.C. section  360bbb-3(b)(1), unless the authorization is terminated or revoked.  Performed at [facility], [place] [address], [place], [place] [address]   Resp Panel by RT-PCR (Flu A&B, Covid) Nasopharyngeal Swab     Status: None   Collection Time: 12/31/20 11:54 AM   Specimen: Nasopharyngeal Swab; Nasopharyngeal(NP) swabs in vial transport medium  Result Value Ref Range Status   SARS Coronavirus 2 by RT PCR NEGATIVE NEGATIVE Final    Comment: (NOTE) SARS-CoV-2 target nucleic acids are NOT DETECTED.  The SARS-CoV-2 RNA is generally detectable in upper respiratory specimens during the acute phase of infection. The lowest concentration of SARS-CoV-2 viral copies this assay can detect is 138 copies/mL. A negative result does not preclude SARS-Cov-2 infection and should not be used as the sole basis for treatment or other patient management decisions. A negative result may occur with  improper specimen collection/handling, submission of specimen other than nasopharyngeal swab, presence of viral mutation(s) within the areas targeted by this assay, and inadequate number of viral copies(<138 copies/mL). A negative result must be combined with clinical observations, patient history, and epidemiological information. The expected result is Negative.  Fact Sheet for Patients:  EntrepreneurPulse.com.au  Fact Sheet for Healthcare Providers:  IncredibleEmployment.be  This test is no t yet approved or cleared by the [place] FDA and  has been authorized for detection and/or diagnosis of SARS-CoV-2 by FDA under an Emergency Use Authorization (EUA). This EUA will remain  in effect (meaning this test can be used) for the duration of the COVID-19 declaration under Section 564(b)(1) of the Act, 21 U.S.C.section 360bbb-3(b)(1), unless the authorization is terminated  or revoked sooner.       Influenza A by PCR NEGATIVE NEGATIVE Final   Influenza B by PCR NEGATIVE  NEGATIVE Final    Comment: (NOTE) The Xpert Xpress SARS-CoV-2/FLU/RSV plus assay is intended as an aid in the diagnosis of influenza from Nasopharyngeal swab specimens and should not be used as a sole basis for treatment. Nasal washings and aspirates are unacceptable for Xpert Xpress SARS-CoV-2/FLU/RSV testing.  Fact Sheet for Patients: EntrepreneurPulse.com.au  Fact Sheet for Healthcare Providers: IncredibleEmployment.be  This test is not yet approved or cleared by the [place] FDA and has been authorized for detection and/or diagnosis of SARS-CoV-2 by FDA under an Emergency Use Authorization (EUA). This EUA will remain in effect (meaning this test can be used) for the duration of the COVID-19 declaration under Section 564(b)(1) of the Act, 21 U.S.C. section 360bbb-3(b)(1), unless the authorization is terminated or revoked.  Performed at [facility], [place] [address] [place], [place] [address]      Studies: No results found.    Flora Lipps, MD  [facility] 01/01/2021  If 7PM-7AM, please contact night-coverage

## 2021-01-01 NOTE — TOC Transition Note (Signed)
Transition of Care [facility]) - CM/SW Discharge Note   Patient Details  Name: Alexandria Mclaughlin MRN: [id number] Date of Birth: [birth year]  Transition of Care [facility]) CM/SW Contact:  Geralynn Ochs, LCSW Phone Number: 01/01/2021, 2:22 PM   Clinical Narrative:   Nurse to call report to [phone], Room 1205.    Final next level of care: Skilled Nursing Facility Barriers to Discharge: Barriers Resolved   Patient Goals and CMS Choice Patient states their goals for this hospitalization and ongoing recovery are:: to get rehab CMS Medicare.gov Compare Post Acute Care list provided to:: Patient Choice offered to / list presented to : Patient  Discharge Placement              Patient [facility] bed at: [facility] Patient to be transferred to facility by: [facility] Name of family member notified: Self Patient and family notified of of transfer: 01/01/21  Discharge Plan and Services     Post Acute Care Choice: [place]                               Social Determinants of Health (SDOH) Interventions     Readmission Risk Interventions No flowsheet data found.

## 2021-01-01 NOTE — Plan of Care (Signed)

## 2021-01-01 NOTE — TOC Progression Note (Signed)
Transition of Care [facility]) - Progression Note    Patient Details  Name: Alexandria Mclaughlin MRN: [id number] Date of Birth: April 28, 1940  Transition of Care [facility]) CM/SW [place], [place] Phone Number: 01/01/2021, 11:23 AM  Clinical Narrative:   CSW met with patient to discuss bed offers. Patient would like to choose [facility]. CSW contacted [facility] to confirm bed availability, will initiate insurance authorization. CSW to follow.    Expected Discharge Plan: [place] Barriers to Discharge: Continued Medical Work up, Ship broker  Expected Discharge Plan and Services Expected Discharge Plan: [place] Choice: [place] arrangements for the past 2 months: Single Family Home                                       Social Determinants of Health (SDOH) Interventions    Readmission Risk Interventions No flowsheet data found.

## 2021-01-01 NOTE — Progress Notes (Signed)
Occupational Therapy Treatment Note  Required Max A to squat pivot transfer toward L stronger side. Max A with LB ADL. Pt with increased LE weakness, RLE weaker than L, BUE generalized weakness and inability to maintain sitting balance EOB, which is a change since last session. Nsg and MD notified. Pt will need rehab at SNF given significant change in functional status. Will continue to follow acutely.     01/01/21 1200  OT Visit Information  Last OT Received On 01/01/21  Assistance Needed +2 (to progress mobility)  History of Present Illness CARIS CERVENY is a 80 y.o. female with PMHx: lumbar stenosis s/p surgery, chronic back pain on high-dose narcotics, HTN presented to hospital with difficulty ambulating, slurred speech and confusion. MRI Brain negative. ?TIA or lumbar stenoisis flare-up?  Precautions  Precautions Back;Fall (back for comfort)  Pain Assessment  Pain Assessment 0-10  Pain Score 5  Pain Location low back  Pain Descriptors / Indicators Discomfort;Sore  Pain Intervention(s) Monitored during session  Cognition  Arousal/Alertness Awake/alert  Behavior During Therapy Anxious  Overall Cognitive Status No family/caregiver present to determine baseline cognitive functioning  General Comments slow processing; unable to find remote which was in her lap  Upper Extremity Assessment  Upper Extremity Assessment Generalized weakness;RUE deficits/detail;LUE deficits/detail  RUE Deficits / Details @ 3+/5 throughout; "give away"; using functionally  LUE Deficits / Details similar to RUE, including giveaway; L grip strength was a little weaker than R; using functionally  Lower Extremity Assessment  Lower Extremity Assessment Defer to PT evaluation (RLE significantly weaker than L; ataxic)  ADL  Eating/Feeding Modified independent  Grooming Set up  Upper Body Bathing Set up  Upper Body Dressing  Minimal assistance  Toilet Transfer Maximal assistance;Squat-pivot  Toileting- Clothing  Manipulation and Hygiene Maximal assistance  Functional mobility during ADLs Maximal assistance (squat pivot)  General ADL Comments unable to sit unsupported EOB; falling forward or to L  Bed Mobility  Overal bed mobility Needs Assistance  Bed Mobility Supine to Sit  Supine to sit Mod assist  Transfers  Overall transfer level Needs assistance  Squat pivot transfers Max assist (toward L)  General transfer comment Pt using BUE; R knee blocked; stabilized by reaching over pt's back and having pt use momentum to transfer toward L  Balance  Overall balance assessment Needs assistance  Sitting balance-Leahy Scale Poor  Standing balance-Leahy Scale Zero  OT - End of Session  Equipment Utilized During Treatment Gait belt  Activity Tolerance Patient tolerated treatment well  Patient left in chair;with call bell/phone within reach;with chair alarm set  OT Assessment/Plan  OT Plan Discharge plan remains appropriate  OT Visit Diagnosis Unsteadiness on feet (R26.81);Muscle weakness (generalized) (M62.81);Hemiplegia and hemiparesis  OT Frequency (ACUTE ONLY) Min 2X/week  Follow Up Recommendations Skilled nursing-short term rehab (<3 hours/day) (neuro consult)  AM-PAC OT "6 Clicks" Daily Activity Outcome Measure (Version 2)  Help from another person eating meals? 4  Help from another person taking care of personal grooming? 3  Help from another person toileting, which includes using toliet, bedpan, or urinal? 2  Help from another person bathing (including washing, rinsing, drying)? 2  Help from another person to put on and taking off regular upper body clothing? 2  Help from another person to put on and taking off regular lower body clothing? 2  6 Click Score 15  Progressive Mobility  What is the highest level of mobility based on the progressive mobility assessment? Level 2 (Chairfast) - Balance while sitting  on edge of bed and cannot stand  OT Goal Progression  Progress towards OT goals  Progressing toward goals  Acute Rehab OT Goals  Patient Stated Goal to get stronger  OT Goal Formulation With patient  Time For Goal Achievement 01/13/21  Potential to Achieve Goals Good  ADL Goals  Pt Will Transfer to Toilet with min assist;stand pivot transfer;bedside commode  Pt Will Perform Toileting - Clothing Manipulation and hygiene with min assist;sitting/lateral leans;sit to/from stand  Additional ADL Goal #1 Pt will perform OOB ADL tasks with minA overall with good safety awareness.  Additional ADL Goal #2 Pt will participate in higher level cognitive tasks to further assess any decline.  OT Time Calculation  OT Start Time (ACUTE ONLY) 0930  OT Stop Time (ACUTE ONLY) 0958  OT Time Calculation (min) 28 min  OT General Charges  $OT Visit 1 Visit  OT Treatments  $Self Care/Home Management  23-37 mins  Maurie Boettcher, OT/L   Acute OT Clinical Specialist [place] Pager [phone] Office [phone]

## 2021-01-01 NOTE — Progress Notes (Signed)
Physical Therapy Treatment Patient Details Name: Alexandria Mclaughlin MRN: [id number] DOB: Jul 15, 1940 Today's Date: 01/01/2021   History of Present Illness ALIVEAH GALLANT is a 80 y.o. female with PMHx: lumbar stenosis s/p surgery, chronic back pain on high-dose narcotics, HTN presented to hospital with difficulty ambulating, slurred speech and confusion. MRI Brain negative. ?TIA or lumbar stenoisis flare-up?    PT Comments    Patient not progressing this session.  Was able to stand and side step with mod A previously, today unable to stand except with max A and to block the R LE as buckling and with some ataxic movements with attempts to stand without support on the knee.  She needed max A for squat pivot back to bed after up in chair for about and hour with OT due to back pain.  She also relates significant issues in the past that could be exacerbating symptoms as she lost her previous two husbands traumatically and now her boyfriend had a stroke right before she began to have more difficulty.  Feel she would benefit from neurologic consult due to new R LE issues and potentially from psychiatric consultation as well.  Remains appropriate for SNF level rehab at d/c.    Recommendations for follow up therapy are one component of a multi-disciplinary discharge planning process, led by the attending physician.  Recommendations may be updated based on patient status, additional functional criteria and insurance authorization.  Follow Up Recommendations  Skilled nursing-short term rehab (<3 hours/day)     Assistance Recommended at Discharge Frequent or constant Supervision/Assistance  Equipment Recommendations  None recommended by PT    Recommendations for Other Services       Precautions / Restrictions Precautions Precautions: Back;Fall (back for comfort)     Mobility  Bed Mobility Overal bed mobility: Needs Assistance Bed Mobility: Sit to Sidelying         Sit to sidelying: Mod assist General  bed mobility comments: cues for technique, assist for legs into bed    Transfers Overall transfer level: Needs assistance Equipment used: Rolling walker (2 wheels);None Transfers: Sit to/from Stand;Bed to chair/wheelchair/BSC Sit to Stand: Max assist   Squat pivot transfers: Max assist       General transfer comment: stood with max A PT blocking R knee as buckled when she tried to stand with A to RW.  Patient able to weight shift onto that leg, reports some hip pain, but noting knee buckling vs tremors when on R leg, unable to maintain standing without max A; attempted several times to block R knee to allow standing to RW, but pt unable to stand even with max A due to R LE giving away so used drop arm recliner closer to bed for squat pivot back to bed with PT blocking knee and max A.    Ambulation/Gait                   Stairs             Wheelchair Mobility    Modified Rankin (Stroke Patients Only)       Balance Overall balance assessment: Needs assistance Sitting-balance support: Feet supported Sitting balance-Leahy Scale: Fair Sitting balance - Comments: at edge of recliner feet on the floor pt able to balance without lateral lean seen and without UE support     Standing balance-Leahy Scale: Zero Standing balance comment: max A to maintain standing today  Cognition Arousal/Alertness: Awake/alert Behavior During Therapy: Anxious Overall Cognitive Status: No family/caregiver present to determine baseline cognitive functioning                                 General Comments: slow processing; unable to find remote which was in her lap; some even remote memory issues as relating history of her first two spouse's deaths        Exercises      General Comments General comments (skin integrity, edema, etc.): Patient tearful about her limited ability to mobilize after several attempts to stand and began  relating history of her spouse who shot himself after dealing with severe pain from 3 car accidents.  Then reports had lost her first husband from a sudden hear attack and now her boyfriend having a stroke right before she had issues.  Reports history of panic attacks and had therapy after each death.      Pertinent Vitals/Pain Pain Assessment: 0-10 Pain Score: 6  Pain Location: low back Pain Descriptors / Indicators: Discomfort;Sore Pain Intervention(s): Monitored during session;Repositioned    Home Living                          Prior Function            PT Goals (current goals can now be found in the care plan section) Progress towards PT goals: Not progressing toward goals - comment    Frequency    Min 3X/week      PT Plan Current plan remains appropriate    Co-evaluation              AM-PAC PT "6 Clicks" Mobility   Outcome Measure  Help needed turning from your back to your side while in a flat bed without using bedrails?: A Lot Help needed moving from lying on your back to sitting on the side of a flat bed without using bedrails?: A Lot Help needed moving to and from a bed to a chair (including a wheelchair)?: Total Help needed standing up from a chair using your arms (e.g., wheelchair or bedside chair)?: Total Help needed to walk in hospital room?: Total Help needed climbing 3-5 steps with a railing? : Total 6 Click Score: 8    End of Session Equipment Utilized During Treatment: Gait belt Activity Tolerance: Patient limited by pain Patient left: in bed;with call bell/phone within reach;with bed alarm set   PT Visit Diagnosis: Muscle weakness (generalized) (M62.81);Pain;Other abnormalities of gait and mobility (R26.89) Pain - Right/Left: Right Pain - part of body: Hip     Time: 1135-1200 PT Time Calculation (min) (ACUTE ONLY): 25 min  Charges:  $Therapeutic Activity: 23-37 mins                     Magda Kiel, PT Acute Rehabilitation  Services Pager:[phone] Office:[phone] 01/01/2021    Reginia Naas 01/01/2021, 1:12 PM

## 2021-01-01 NOTE — Progress Notes (Signed)
Report given to [facility] LV[facility] reflecting patient's current status.

## 2021-01-01 NOTE — Discharge Summary (Addendum)
Physician Discharge Summary  Alexandria Mclaughlin WNI:[id number] DOB: [birth year] DOA: 12/27/2020  PCP: Merrilee Seashore, MD  Admit date: 12/27/2020 Discharge date: 01/01/2021  Admitted From: Home  Discharge disposition: Skilled nursing facility  Recommendations for Outpatient Follow-Up:   Follow up with your primary care provider at the skilled nursing facility in 3 to 5 days Check CBC, BMP, magnesium in the next visit   Discharge Diagnosis:   Active Problems:   AMS (altered mental status)   Impaired ambulation Lumbar spinal stenosis  Discharge Condition: Improved.  Diet recommendation:  Regular.  Wound care: None.  Code status: Full.   History of Present Illness:   Alexandria Mclaughlin is a 80 y.o. female with past medical history of lumbar stenosis status post surgery, chronic back pain on high-dose narcotics, hypertension presented to hospital with difficulty ambulating, slurred speech and confusion.  Patient does have oxycodone and gabapentin at baseline and is able to walk on her own.  There was no change in her medications.  On this presentation, patient had been having shuffling,wobbly gait with numbness below the knee on the right side which was new.  Patient was also noted to be having confusion and slurred speech, so patient was brought into the hospital.  Patient was then admitted to hospital for further evaluation and treatment.     Hospital Course:   Following conditions were addressed during hospitalization as listed below,  Acute on chronic ambulatory dysfunction Suspected recurrent/flareup of lumbar stenosis.    MRI of the brain did not show any acute findings except for moderate chronic microvascular ischemic changes..  Physical therapy recommended skilled nursing facility placement. Lipid profile reviewed.  Hemoglobin A1c of 6.0.  CTA head and neck without any large vessel occlusion.  Patient had MRI of the lumbosacral spine on 12/29/2020 which showed lumbar spondylosis  and degenerative disc disease causing moderate to prominent central narrowing of the thecal sac at L4-L5 and remote to 40% compression fracture over the L1.  Patient was seen by Dr. Venetia Constable in the past for her spinal surgery.  I had spoken with neurology who did not think that patient would need any neurosurgical intervention.  Patient is against any spinal intervention at this time.  We will continue with physical therapy.  She would benefit from ongoing physical therapy.   Chronic back pain.  Patient is on Klonopin and duloxetine and gabapentin oxycodone currently.  Has had surgeries in the past.  We will continue on discharge.   Acute metabolic encephalopathy Resolved,  No leukocytosis or fever.  Vitamin B12 was 239.  COVID and influenza was negative.  Urine drug screen was negative.  We will add vitamin B12 p.o.     Essential hypertension Continue quinapril.   Anxiety/depression Continue Cymbalta, gabapentin   Disposition.  At this time, patient is stable for disposition to skilled nursing facility.  Unable to reach the patient's daughter on the phone.  Medical Consultants:   Neurology verbal consult  Procedures:    None Subjective:   Today, patient was seen and examined at bedside.  Complains of numbness on the right leg.  Discharge Exam:   Vitals:   01/01/21 0700 01/01/21 1201  BP: (!) 167/93 (!) 116/57  Pulse: 92 86  Resp: 18 18  Temp: 98.1 F (36.7 C) 98.3 F (36.8 C)  SpO2: 94% 94%   Vitals:   01/01/21 0035 01/01/21 0324 01/01/21 0700 01/01/21 1201  BP: (!) 101/53 124/61 (!) 167/93 (!) 116/57  Pulse: 72 63 92  86  Resp: 18 20 18 18   Temp: 98.6 F (37 C) 98.4 F (36.9 C) 98.1 F (36.7 C) 98.3 F (36.8 C)  TempSrc: Oral Oral Oral Oral  SpO2: 97% 100% 94% 94%   General: Alert awake, not in obvious distress HENT: pupils equally reacting to light,  No scleral pallor or icterus noted. Oral mucosa is moist.  Chest:  Clear breath sounds.  Diminished breath  sounds bilaterally. No crackles or wheezes.  CVS: S1 &S2 heard. No murmur.  Regular rate and rhythm. Abdomen: Soft, nontender, nondistended.  Bowel sounds are heard.   Extremities: No cyanosis, clubbing or edema.  Decreased strength over the right leg. Psych: Alert, awake and oriented, normal mood CNS:  No cranial nerve deficits.  Decreased strength over the right leg Skin: Warm and dry.  No rashes noted.  The results of significant diagnostics from this hospitalization (including imaging, microbiology, ancillary and laboratory) are listed below for reference.     Diagnostic Studies:   CT Angio Head W or Wo Contrast  Result Date: 12/28/2020 CLINICAL DATA:  Neuro deficit, stroke suspected EXAM: CT ANGIOGRAPHY HEAD AND NECK TECHNIQUE: Multidetector CT imaging of the head and neck was performed using the standard protocol during bolus administration of intravenous contrast. Multiplanar CT image reconstructions and MIPs were obtained to evaluate the vascular anatomy. Carotid stenosis measurements (when applicable) are obtained utilizing NASCET criteria, using the distal internal carotid diameter as the denominator. CONTRAST:  28mL OMNIPAQUE IOHEXOL 350 MG/ML SOLN COMPARISON:  CT head 12/27/2020 FINDINGS: CT HEAD FINDINGS Brain: No evidence of acute abnormality, including acute infarct, hemorrhage, hydrocephalus, or mass lesion. Periventricular white matter changes, likely the sequela of chronic small vessel ischemic disease. No extra-axial collection. Vascular: No hyperdense vessel.  Please see CTA findings below. Skull: Normal. Negative for fracture or focal lesion. Sinuses: Imaged portions are clear. Status post bilateral lens replacements. Orbits: The mastoids are well aerated. Review of the MIP images confirms the above findings CTA NECK FINDINGS Aortic arch: Standard branching. Imaged portion shows no evidence of aneurysm or dissection. No significant stenosis of the major arch vessel origins. Right  carotid system: No evidence of dissection, stenosis (50% or greater) or occlusion. Left carotid system: No evidence of dissection, stenosis (50% or greater) or occlusion. Vertebral arteries: Codominant. No evidence of dissection, stenosis (50% or greater) or occlusion. Skeleton: No acute osseous abnormality. Other neck: Multiple subcentimeter hypoattenuating nodules in the thyroid, which is heterogeneously enhancing. Otherwise negative. Upper chest: No focal pulmonary opacity or pleural effusion. Review of the MIP images confirms the above findings CTA HEAD FINDINGS Anterior circulation: Both internal carotid arteries are patent to the termini, without stenosis or other abnormality. Calcifications in the left greater than right cavernous carotids without focal narrowing. Aplastic or hypoplastic right A1, with a large patent left A1. Normal anterior communicating artery. Anterior cerebral arteries are patent to their distal aspects. No M1 stenosis or occlusion. Normal MCA bifurcations. Distal MCA branches perfused and symmetric. Posterior circulation: Vertebral arteries widely patent to the vertebrobasilar junction without stenosis. Posterior inferior cerebral arteries patent bilaterally. Basilar patent to its distal aspect. Superior cerebral arteries patent bilaterally. Aplastic right P1. Fetal origin of the right PCA. PCAs well perfused to their distal aspects without stenosis. Relative diminutive but patent left posterior communicating artery. Venous sinuses: As permitted by contrast timing, patent. Anatomic variants: None significant Review of the MIP images confirms the above findings IMPRESSION: 1. No acute intracranial process. 2. No hemodynamically significant stenosis in the neck.  3. No intracranial large vessel occlusion or significant stenosis. Electronically Signed   By: Merilyn Baba M.D.   On: 12/28/2020 12:47   CT Head Wo Contrast  Result Date: 12/27/2020 CLINICAL DATA:  Mental status change,  unknown cause EXAM: CT HEAD WITHOUT CONTRAST TECHNIQUE: Contiguous axial images were obtained from the base of the skull through the vertex without intravenous contrast. COMPARISON:  MR head 12/01/2018 BRAIN: BRAIN Patchy and confluent areas of decreased attenuation are noted throughout the deep and periventricular white matter of the cerebral hemispheres bilaterally, compatible with chronic microvascular ischemic disease. No evidence of large-territorial acute infarction. No parenchymal hemorrhage. No mass lesion. No extra-axial collection. No mass effect or midline shift. No hydrocephalus. Basilar cisterns are patent. Vascular: No hyperdense vessel. Atherosclerotic calcifications are present within the cavernous internal carotid arteries. Skull: No acute fracture or focal lesion. Sinuses/Orbits: Paranasal sinuses and mastoid air cells are clear. Bilateral lens replacement. Otherwise orbits are unremarkable. Other: None. IMPRESSION: No acute intracranial abnormality. Electronically Signed   By: Iven Finn M.D.   On: 12/27/2020 16:09   CT Angio Neck W and/or Wo Contrast  Result Date: 12/28/2020 CLINICAL DATA:  Neuro deficit, stroke suspected EXAM: CT ANGIOGRAPHY HEAD AND NECK TECHNIQUE: Multidetector CT imaging of the head and neck was performed using the standard protocol during bolus administration of intravenous contrast. Multiplanar CT image reconstructions and MIPs were obtained to evaluate the vascular anatomy. Carotid stenosis measurements (when applicable) are obtained utilizing NASCET criteria, using the distal internal carotid diameter as the denominator. CONTRAST:  2mL OMNIPAQUE IOHEXOL 350 MG/ML SOLN COMPARISON:  CT head 12/27/2020 FINDINGS: CT HEAD FINDINGS Brain: No evidence of acute abnormality, including acute infarct, hemorrhage, hydrocephalus, or mass lesion. Periventricular white matter changes, likely the sequela of chronic small vessel ischemic disease. No extra-axial collection.  Vascular: No hyperdense vessel.  Please see CTA findings below. Skull: Normal. Negative for fracture or focal lesion. Sinuses: Imaged portions are clear. Status post bilateral lens replacements. Orbits: The mastoids are well aerated. Review of the MIP images confirms the above findings CTA NECK FINDINGS Aortic arch: Standard branching. Imaged portion shows no evidence of aneurysm or dissection. No significant stenosis of the major arch vessel origins. Right carotid system: No evidence of dissection, stenosis (50% or greater) or occlusion. Left carotid system: No evidence of dissection, stenosis (50% or greater) or occlusion. Vertebral arteries: Codominant. No evidence of dissection, stenosis (50% or greater) or occlusion. Skeleton: No acute osseous abnormality. Other neck: Multiple subcentimeter hypoattenuating nodules in the thyroid, which is heterogeneously enhancing. Otherwise negative. Upper chest: No focal pulmonary opacity or pleural effusion. Review of the MIP images confirms the above findings CTA HEAD FINDINGS Anterior circulation: Both internal carotid arteries are patent to the termini, without stenosis or other abnormality. Calcifications in the left greater than right cavernous carotids without focal narrowing. Aplastic or hypoplastic right A1, with a large patent left A1. Normal anterior communicating artery. Anterior cerebral arteries are patent to their distal aspects. No M1 stenosis or occlusion. Normal MCA bifurcations. Distal MCA branches perfused and symmetric. Posterior circulation: Vertebral arteries widely patent to the vertebrobasilar junction without stenosis. Posterior inferior cerebral arteries patent bilaterally. Basilar patent to its distal aspect. Superior cerebral arteries patent bilaterally. Aplastic right P1. Fetal origin of the right PCA. PCAs well perfused to their distal aspects without stenosis. Relative diminutive but patent left posterior communicating artery. Venous sinuses:  As permitted by contrast timing, patent. Anatomic variants: None significant Review of the MIP images confirms the  above findings IMPRESSION: 1. No acute intracranial process. 2. No hemodynamically significant stenosis in the neck. 3. No intracranial large vessel occlusion or significant stenosis. Electronically Signed   By: Merilyn Baba M.D.   On: 12/28/2020 12:47   MR BRAIN W WO CONTRAST  Result Date: 12/28/2020 CLINICAL DATA:  Neuro deficit, acute, stroke suspected; slurred speech EXAM: MRI HEAD WITHOUT AND WITH CONTRAST TECHNIQUE: Multiplanar, multiecho pulse sequences of the brain and surrounding structures were obtained without and with intravenous contrast. CONTRAST:  12mL GADAVIST GADOBUTROL 1 MMOL/ML IV SOLN COMPARISON:  October 2020 FINDINGS: Brain: There is no acute infarction or intracranial hemorrhage. There is no intracranial mass, mass effect, or edema. There is no hydrocephalus or extra-axial fluid collection. Ventricles and sulci are within normal limits in size and configuration. Patchy and confluent areas of T2 hyperintensity in the supratentorial and pontine white matter are nonspecific but probably reflect similar moderate chronic microvascular ischemic changes. A focus of susceptibility associated with T2 hyperintensity is again noted in the right frontal white matter likely reflecting gliosis and chronic blood products. No abnormal enhancement. Vascular: Major vessel flow voids at the skull base are preserved. Skull and upper cervical spine: Normal marrow signal is preserved. Sinuses/Orbits: Paranasal sinus mucosal thickening. Bilateral lens replacements. Other: Sella is unremarkable.  Mastoid air cells are clear. IMPRESSION: No evidence of recent infarction, hemorrhage, or mass. No abnormal enhancement. Similar moderate chronic microvascular ischemic changes. Electronically Signed   By: Macy Mis M.D.   On: 12/28/2020 09:57     Labs:   Basic Metabolic Panel: Recent Labs  Lab  12/27/20 1553 12/30/20 0014  NA 140 137  K 4.3 3.7  CL 105 105  CO2 30 26  GLUCOSE 108* 131*  BUN 17 24*  CREATININE 0.60 0.82  CALCIUM 9.7 9.4  MG  --  1.9   GFR CrCl cannot be calculated (Unknown ideal weight.). Liver Function Tests: Recent Labs  Lab 12/27/20 1553  AST 24  ALT 18  ALKPHOS 64  BILITOT 0.6  PROT 6.6  ALBUMIN 3.8   No results for input(s): LIPASE, AMYLASE in the last 168 hours. No results for input(s): AMMONIA in the last 168 hours. Coagulation profile No results for input(s): INR, PROTIME in the last 168 hours.  CBC: Recent Labs  Lab 12/27/20 1553 12/30/20 0014  WBC 8.5 9.5  HGB 13.9 14.2  HCT 43.6 44.2  MCV 93.6 92.5  PLT 193 219   Cardiac Enzymes: No results for input(s): CKTOTAL, CKMB, CKMBINDEX, TROPONINI in the last 168 hours. BNP: Invalid input(s): POCBNP CBG: Recent Labs  Lab 12/27/20 1546  GLUCAP 110*   D-Dimer No results for input(s): DDIMER in the last 72 hours. Hgb A1c No results for input(s): HGBA1C in the last 72 hours. Lipid Profile No results for input(s): CHOL, HDL, LDLCALC, TRIG, CHOLHDL, LDLDIRECT in the last 72 hours. Thyroid function studies No results for input(s): TSH, T4TOTAL, T3FREE, THYROIDAB in the last 72 hours.  Invalid input(s): FREET3 Anemia work up No results for input(s): VITAMINB12, FOLATE, FERRITIN, TIBC, IRON, RETICCTPCT in the last 72 hours. Microbiology Recent Results (from the past 240 hour(s))  Resp Panel by RT-PCR (Flu A&B, Covid) Nasopharyngeal Swab     Status: None   Collection Time: 12/28/20 12:34 AM   Specimen: Nasopharyngeal Swab; Nasopharyngeal(NP) swabs in vial transport medium  Result Value Ref Range Status   SARS Coronavirus 2 by RT PCR NEGATIVE NEGATIVE Final    Comment: (NOTE) SARS-CoV-2 target nucleic acids are NOT  DETECTED.  The SARS-CoV-2 RNA is generally detectable in upper respiratory specimens during the acute phase of infection. The lowest concentration of SARS-CoV-2  viral copies this assay can detect is 138 copies/mL. A negative result does not preclude SARS-Cov-2 infection and should not be used as the sole basis for treatment or other patient management decisions. A negative result may occur with  improper specimen collection/handling, submission of specimen other than nasopharyngeal swab, presence of viral mutation(s) within the areas targeted by this assay, and inadequate number of viral copies(<138 copies/mL). A negative result must be combined with clinical observations, patient history, and epidemiological information. The expected result is Negative.  Fact Sheet for Patients:  EntrepreneurPulse.com.au  Fact Sheet for Healthcare Providers:  IncredibleEmployment.be  This test is no t yet approved or cleared by the [place] FDA and  has been authorized for detection and/or diagnosis of SARS-CoV-2 by FDA under an Emergency Use Authorization (EUA). This EUA will remain  in effect (meaning this test can be used) for the duration of the COVID-19 declaration under Section 564(b)(1) of the Act, 21 U.S.C.section 360bbb-3(b)(1), unless the authorization is terminated  or revoked sooner.       Influenza A by PCR NEGATIVE NEGATIVE Final   Influenza B by PCR NEGATIVE NEGATIVE Final    Comment: (NOTE) The Xpert Xpress SARS-CoV-2/FLU/RSV plus assay is intended as an aid in the diagnosis of influenza from Nasopharyngeal swab specimens and should not be used as a sole basis for treatment. Nasal washings and aspirates are unacceptable for Xpert Xpress SARS-CoV-2/FLU/RSV testing.  Fact Sheet for Patients: EntrepreneurPulse.com.au  Fact Sheet for Healthcare Providers: IncredibleEmployment.be  This test is not yet approved or cleared by the [place] FDA and has been authorized for detection and/or diagnosis of SARS-CoV-2 by FDA under an Emergency Use Authorization  (EUA). This EUA will remain in effect (meaning this test can be used) for the duration of the COVID-19 declaration under Section 564(b)(1) of the Act, 21 U.S.C. section 360bbb-3(b)(1), unless the authorization is terminated or revoked.  Performed at [facility], [place] [address], [place], [place] [address]   Resp Panel by RT-PCR (Flu A&B, Covid) Nasopharyngeal Swab     Status: None   Collection Time: 12/31/20 11:54 AM   Specimen: Nasopharyngeal Swab; Nasopharyngeal(NP) swabs in vial transport medium  Result Value Ref Range Status   SARS Coronavirus 2 by RT PCR NEGATIVE NEGATIVE Final    Comment: (NOTE) SARS-CoV-2 target nucleic acids are NOT DETECTED.  The SARS-CoV-2 RNA is generally detectable in upper respiratory specimens during the acute phase of infection. The lowest concentration of SARS-CoV-2 viral copies this assay can detect is 138 copies/mL. A negative result does not preclude SARS-Cov-2 infection and should not be used as the sole basis for treatment or other patient management decisions. A negative result may occur with  improper specimen collection/handling, submission of specimen other than nasopharyngeal swab, presence of viral mutation(s) within the areas targeted by this assay, and inadequate number of viral copies(<138 copies/mL). A negative result must be combined with clinical observations, patient history, and epidemiological information. The expected result is Negative.  Fact Sheet for Patients:  EntrepreneurPulse.com.au  Fact Sheet for Healthcare Providers:  IncredibleEmployment.be  This test is no t yet approved or cleared by the [place] FDA and  has been authorized for detection and/or diagnosis of SARS-CoV-2 by FDA under an Emergency Use Authorization (EUA). This EUA will remain  in effect (meaning this test can be used) for the  duration of the COVID-19 declaration under Section 564(b)(1) of the Act,  21 U.S.C.section 360bbb-3(b)(1), unless the authorization is terminated  or revoked sooner.       Influenza A by PCR NEGATIVE NEGATIVE Final   Influenza B by PCR NEGATIVE NEGATIVE Final    Comment: (NOTE) The Xpert Xpress SARS-CoV-2/FLU/RSV plus assay is intended as an aid in the diagnosis of influenza from Nasopharyngeal swab specimens and should not be used as a sole basis for treatment. Nasal washings and aspirates are unacceptable for Xpert Xpress SARS-CoV-2/FLU/RSV testing.  Fact Sheet for Patients: EntrepreneurPulse.com.au  Fact Sheet for Healthcare Providers: IncredibleEmployment.be  This test is not yet approved or cleared by the [place] FDA and has been authorized for detection and/or diagnosis of SARS-CoV-2 by FDA under an Emergency Use Authorization (EUA). This EUA will remain in effect (meaning this test can be used) for the duration of the COVID-19 declaration under Section 564(b)(1) of the Act, 21 U.S.C. section 360bbb-3(b)(1), unless the authorization is terminated or revoked.  Performed at [facility], [place] [address] [place], [place] [address]      Discharge Instructions:   Discharge Instructions     Diet - low sodium heart healthy   Complete by: As directed    Discharge instructions   Complete by: As directed    Continue physical therapy at skilled nursing facility.  Follow-up with your primary care provider at skilled facility in 3 to 5 days.   Increase activity slowly   Complete by: As directed       Allergies as of 01/01/2021   No Known Allergies      Medication List     STOP taking these medications    fentaNYL 25 MCG/HR Commonly known as: [place] these medications    aspirin-acetaminophen-caffeine 250-250-65 MG tablet Commonly known as: EXCEDRIN MIGRAINE Take 1 tablet by mouth every 6 (six) hours as needed for headache or migraine.   CALCIUM+D3 PO Take 1 tablet by  mouth daily.   clonazePAM 0.5 MG tablet Commonly known as: KLONOPIN Take 1 tablet (0.5 mg total) by mouth at bedtime as needed for anxiety (sleep). What changed:  how much to take how to take this when to take this reasons to take this additional instructions   cyanocobalamin 1000 MCG tablet Take 1 tablet (1,000 mcg total) by mouth daily. Start taking on: January 02, 2021   DULoxetine 30 MG capsule Commonly known as: CYMBALTA TAKE 1 CAPSULE BY MOUTH EVERY DAY What changed: how much to take   gabapentin 600 MG tablet Commonly known as: NEURONTIN TAKE 1 TABLET 4 TIMES A DAY What changed: See the new instructions.   Meclizine HCl 25 MG Chew Chew 25-50 mg by mouth 3 (three) times daily as needed (dizziness).   Melatonin 5 MG Caps Take 5 mg by mouth at bedtime as needed (sleep).   multivitamin with minerals Tabs tablet Take 1 tablet by mouth daily.   oxyCODONE-acetaminophen 10-325 MG tablet Commonly known as: Percocet Take 1 tablet by mouth every 6 (six) hours as needed for pain.   polyethylene glycol 17 g packet Commonly known as: MIRALAX / GLYCOLAX Take 17 g by mouth daily as needed for moderate constipation (not helped with psyllium).   psyllium 95 % Pack Commonly known as: HYDROCIL/METAMUCIL Take 1 packet by mouth daily. Start taking on: January 02, 2021   quinapril 20 MG tablet Commonly known as: ACCUPRIL Take 20 mg by mouth daily.  rosuvastatin 20 MG tablet Commonly known as: CRESTOR Take 20 mg by mouth at bedtime.   traZODone 100 MG tablet Commonly known as: DESYREL TAKE 1 TABLET BY MOUTH EVERYDAY AT BEDTIME What changed: See the new instructions.        Contact information for after-discharge care     Destination     [facility] .   Service: Skilled Nursing Contact information: [place] [phone]                      Time coordinating discharge: 39  minutes  Signed:  Macon Sandiford  [facility] 01/01/2021, 2:01 PM

## 2021-01-03 DIAGNOSIS — I679 Cerebrovascular disease, unspecified: Secondary | ICD-10-CM | POA: Diagnosis not present

## 2021-01-03 DIAGNOSIS — M48061 Spinal stenosis, lumbar region without neurogenic claudication: Secondary | ICD-10-CM | POA: Diagnosis not present

## 2021-01-03 DIAGNOSIS — R269 Unspecified abnormalities of gait and mobility: Secondary | ICD-10-CM | POA: Diagnosis not present

## 2021-01-03 DIAGNOSIS — E538 Deficiency of other specified B group vitamins: Secondary | ICD-10-CM | POA: Diagnosis not present

## 2021-01-03 DIAGNOSIS — I10 Essential (primary) hypertension: Secondary | ICD-10-CM | POA: Diagnosis not present

## 2021-01-03 DIAGNOSIS — G9341 Metabolic encephalopathy: Secondary | ICD-10-CM | POA: Diagnosis not present

## 2021-01-15 DIAGNOSIS — I10 Essential (primary) hypertension: Secondary | ICD-10-CM | POA: Diagnosis not present

## 2021-01-15 DIAGNOSIS — I679 Cerebrovascular disease, unspecified: Secondary | ICD-10-CM | POA: Diagnosis not present

## 2021-01-15 DIAGNOSIS — M48061 Spinal stenosis, lumbar region without neurogenic claudication: Secondary | ICD-10-CM | POA: Diagnosis not present

## 2021-01-15 DIAGNOSIS — G8929 Other chronic pain: Secondary | ICD-10-CM | POA: Diagnosis not present

## 2021-01-16 ENCOUNTER — Telehealth: Payer: Self-pay

## 2021-01-16 DIAGNOSIS — M7522 Bicipital tendinitis, left shoulder: Secondary | ICD-10-CM

## 2021-01-16 MED ORDER — OXYCODONE-ACETAMINOPHEN 10-325 MG PO TABS: 1.0000 | ORAL_TABLET | Freq: Four times a day (QID) | ORAL | 0 refills | Status: DC | PRN

## 2021-01-16 NOTE — Telephone Encounter (Signed)
Patient is calling to request a refill on Percocet. Last fill 12/13/20.

## 2021-01-16 NOTE — Telephone Encounter (Signed)
Patient notified of Oxycodone sent to pharmacy

## 2021-01-16 NOTE — Telephone Encounter (Signed)
Rx sent 

## 2021-01-16 NOTE — Addendum Note (Signed)
Addended by: Alger Simons T on: 01/16/2021 03:50 PM   Modules accepted: Orders

## 2021-01-18 DIAGNOSIS — G9340 Encephalopathy, unspecified: Secondary | ICD-10-CM | POA: Diagnosis not present

## 2021-01-18 DIAGNOSIS — F32A Depression, unspecified: Secondary | ICD-10-CM | POA: Diagnosis not present

## 2021-01-22 DIAGNOSIS — F32A Depression, unspecified: Secondary | ICD-10-CM | POA: Diagnosis not present

## 2021-01-22 DIAGNOSIS — G9340 Encephalopathy, unspecified: Secondary | ICD-10-CM | POA: Diagnosis not present

## 2021-01-23 DIAGNOSIS — E782 Mixed hyperlipidemia: Secondary | ICD-10-CM | POA: Diagnosis not present

## 2021-01-23 DIAGNOSIS — M810 Age-related osteoporosis without current pathological fracture: Secondary | ICD-10-CM | POA: Diagnosis not present

## 2021-01-23 DIAGNOSIS — I10 Essential (primary) hypertension: Secondary | ICD-10-CM | POA: Diagnosis not present

## 2021-01-29 ENCOUNTER — Telehealth: Payer: Self-pay

## 2021-01-29 DIAGNOSIS — I10 Essential (primary) hypertension: Secondary | ICD-10-CM | POA: Diagnosis not present

## 2021-01-29 DIAGNOSIS — E538 Deficiency of other specified B group vitamins: Secondary | ICD-10-CM | POA: Diagnosis not present

## 2021-01-29 DIAGNOSIS — M21612 Bunion of left foot: Secondary | ICD-10-CM | POA: Diagnosis not present

## 2021-01-29 DIAGNOSIS — Z87442 Personal history of urinary calculi: Secondary | ICD-10-CM | POA: Diagnosis not present

## 2021-01-29 DIAGNOSIS — Z8673 Personal history of transient ischemic attack (TIA), and cerebral infarction without residual deficits: Secondary | ICD-10-CM | POA: Diagnosis not present

## 2021-01-29 DIAGNOSIS — Z87891 Personal history of nicotine dependence: Secondary | ICD-10-CM | POA: Diagnosis not present

## 2021-01-29 DIAGNOSIS — K219 Gastro-esophageal reflux disease without esophagitis: Secondary | ICD-10-CM | POA: Diagnosis not present

## 2021-01-29 DIAGNOSIS — M48061 Spinal stenosis, lumbar region without neurogenic claudication: Secondary | ICD-10-CM | POA: Diagnosis not present

## 2021-01-29 DIAGNOSIS — M47892 Other spondylosis, cervical region: Secondary | ICD-10-CM | POA: Diagnosis not present

## 2021-01-29 DIAGNOSIS — M5136 Other intervertebral disc degeneration, lumbar region: Secondary | ICD-10-CM | POA: Diagnosis not present

## 2021-01-29 DIAGNOSIS — G8929 Other chronic pain: Secondary | ICD-10-CM | POA: Diagnosis not present

## 2021-01-29 DIAGNOSIS — H547 Unspecified visual loss: Secondary | ICD-10-CM | POA: Diagnosis not present

## 2021-01-29 DIAGNOSIS — M47816 Spondylosis without myelopathy or radiculopathy, lumbar region: Secondary | ICD-10-CM | POA: Diagnosis not present

## 2021-01-29 NOTE — Telephone Encounter (Signed)
Error. Please see previous encounter

## 2021-01-30 DIAGNOSIS — H547 Unspecified visual loss: Secondary | ICD-10-CM | POA: Diagnosis not present

## 2021-01-30 DIAGNOSIS — Z87442 Personal history of urinary calculi: Secondary | ICD-10-CM | POA: Diagnosis not present

## 2021-01-30 DIAGNOSIS — M47816 Spondylosis without myelopathy or radiculopathy, lumbar region: Secondary | ICD-10-CM | POA: Diagnosis not present

## 2021-01-30 DIAGNOSIS — K219 Gastro-esophageal reflux disease without esophagitis: Secondary | ICD-10-CM | POA: Diagnosis not present

## 2021-01-30 DIAGNOSIS — Z87891 Personal history of nicotine dependence: Secondary | ICD-10-CM | POA: Diagnosis not present

## 2021-01-30 DIAGNOSIS — E538 Deficiency of other specified B group vitamins: Secondary | ICD-10-CM | POA: Diagnosis not present

## 2021-01-30 DIAGNOSIS — M21612 Bunion of left foot: Secondary | ICD-10-CM | POA: Diagnosis not present

## 2021-01-30 DIAGNOSIS — M48061 Spinal stenosis, lumbar region without neurogenic claudication: Secondary | ICD-10-CM | POA: Diagnosis not present

## 2021-01-30 DIAGNOSIS — I10 Essential (primary) hypertension: Secondary | ICD-10-CM | POA: Diagnosis not present

## 2021-01-30 DIAGNOSIS — G8929 Other chronic pain: Secondary | ICD-10-CM | POA: Diagnosis not present

## 2021-01-30 DIAGNOSIS — Z8673 Personal history of transient ischemic attack (TIA), and cerebral infarction without residual deficits: Secondary | ICD-10-CM | POA: Diagnosis not present

## 2021-01-30 DIAGNOSIS — M47892 Other spondylosis, cervical region: Secondary | ICD-10-CM | POA: Diagnosis not present

## 2021-01-30 DIAGNOSIS — M5136 Other intervertebral disc degeneration, lumbar region: Secondary | ICD-10-CM | POA: Diagnosis not present

## 2021-01-30 MED ORDER — FENTANYL 25 MCG/HR TD PT72: 1.0000 | MEDICATED_PATCH | TRANSDERMAL | 0 refills | Status: DC

## 2021-01-30 NOTE — Addendum Note (Signed)
Addended by: Alger Simons T on: 01/30/2021 09:03 AM   Modules accepted: Orders

## 2021-01-30 NOTE — Telephone Encounter (Signed)
Fentanyl refilled.  

## 2021-01-31 DIAGNOSIS — K219 Gastro-esophageal reflux disease without esophagitis: Secondary | ICD-10-CM | POA: Diagnosis not present

## 2021-01-31 DIAGNOSIS — G8929 Other chronic pain: Secondary | ICD-10-CM | POA: Diagnosis not present

## 2021-01-31 DIAGNOSIS — Z87891 Personal history of nicotine dependence: Secondary | ICD-10-CM | POA: Diagnosis not present

## 2021-01-31 DIAGNOSIS — M48061 Spinal stenosis, lumbar region without neurogenic claudication: Secondary | ICD-10-CM | POA: Diagnosis not present

## 2021-01-31 DIAGNOSIS — I10 Essential (primary) hypertension: Secondary | ICD-10-CM | POA: Diagnosis not present

## 2021-01-31 DIAGNOSIS — M5136 Other intervertebral disc degeneration, lumbar region: Secondary | ICD-10-CM | POA: Diagnosis not present

## 2021-01-31 DIAGNOSIS — Z8673 Personal history of transient ischemic attack (TIA), and cerebral infarction without residual deficits: Secondary | ICD-10-CM | POA: Diagnosis not present

## 2021-01-31 DIAGNOSIS — E538 Deficiency of other specified B group vitamins: Secondary | ICD-10-CM | POA: Diagnosis not present

## 2021-01-31 DIAGNOSIS — M47816 Spondylosis without myelopathy or radiculopathy, lumbar region: Secondary | ICD-10-CM | POA: Diagnosis not present

## 2021-01-31 DIAGNOSIS — M47892 Other spondylosis, cervical region: Secondary | ICD-10-CM | POA: Diagnosis not present

## 2021-01-31 DIAGNOSIS — Z87442 Personal history of urinary calculi: Secondary | ICD-10-CM | POA: Diagnosis not present

## 2021-01-31 DIAGNOSIS — H547 Unspecified visual loss: Secondary | ICD-10-CM | POA: Diagnosis not present

## 2021-01-31 DIAGNOSIS — M21612 Bunion of left foot: Secondary | ICD-10-CM | POA: Diagnosis not present

## 2021-02-01 DIAGNOSIS — M5136 Other intervertebral disc degeneration, lumbar region: Secondary | ICD-10-CM | POA: Diagnosis not present

## 2021-02-01 DIAGNOSIS — M47816 Spondylosis without myelopathy or radiculopathy, lumbar region: Secondary | ICD-10-CM | POA: Diagnosis not present

## 2021-02-01 DIAGNOSIS — I10 Essential (primary) hypertension: Secondary | ICD-10-CM | POA: Diagnosis not present

## 2021-02-01 DIAGNOSIS — M48061 Spinal stenosis, lumbar region without neurogenic claudication: Secondary | ICD-10-CM | POA: Diagnosis not present

## 2021-02-04 DIAGNOSIS — Z87891 Personal history of nicotine dependence: Secondary | ICD-10-CM | POA: Diagnosis not present

## 2021-02-04 DIAGNOSIS — M47892 Other spondylosis, cervical region: Secondary | ICD-10-CM | POA: Diagnosis not present

## 2021-02-04 DIAGNOSIS — M5136 Other intervertebral disc degeneration, lumbar region: Secondary | ICD-10-CM | POA: Diagnosis not present

## 2021-02-04 DIAGNOSIS — M47816 Spondylosis without myelopathy or radiculopathy, lumbar region: Secondary | ICD-10-CM | POA: Diagnosis not present

## 2021-02-04 DIAGNOSIS — G8929 Other chronic pain: Secondary | ICD-10-CM | POA: Diagnosis not present

## 2021-02-04 DIAGNOSIS — I10 Essential (primary) hypertension: Secondary | ICD-10-CM | POA: Diagnosis not present

## 2021-02-04 DIAGNOSIS — M21612 Bunion of left foot: Secondary | ICD-10-CM | POA: Diagnosis not present

## 2021-02-04 DIAGNOSIS — H547 Unspecified visual loss: Secondary | ICD-10-CM | POA: Diagnosis not present

## 2021-02-04 DIAGNOSIS — Z87442 Personal history of urinary calculi: Secondary | ICD-10-CM | POA: Diagnosis not present

## 2021-02-04 DIAGNOSIS — Z8673 Personal history of transient ischemic attack (TIA), and cerebral infarction without residual deficits: Secondary | ICD-10-CM | POA: Diagnosis not present

## 2021-02-04 DIAGNOSIS — M48061 Spinal stenosis, lumbar region without neurogenic claudication: Secondary | ICD-10-CM | POA: Diagnosis not present

## 2021-02-04 DIAGNOSIS — K219 Gastro-esophageal reflux disease without esophagitis: Secondary | ICD-10-CM | POA: Diagnosis not present

## 2021-02-04 DIAGNOSIS — E538 Deficiency of other specified B group vitamins: Secondary | ICD-10-CM | POA: Diagnosis not present

## 2021-02-05 NOTE — Progress Notes (Addendum)
NEUROLOGY CONSULTATION NOTE  Alexandria Mclaughlin MRN: [id number] DOB: [birth year]  Referring provider: Danella Sensing, NP Primary care provider: Merrilee Seashore, MD  Reason for consult:  gait instability  Assessment/Plan:   1  Ataxia - While she has multiple etiologies that may cause gait instability, such as lumbar spine disease, arthritis, polypharmacy, I don't think that would be the cause of her current balance problems.  This appears to be a subacute progression.  Given the myoclonus, consider a myelopathy.  Also consider an underlying neurodegenerative disease.  She may have underlying polyneuropathy, but I wouldn't expect it to progress so quickly unless it was demyelinating (which seems less likely given preserved reflexes) or uncontrolled diabetes (which is not the case as last Hgb A1c was 6).   She has low B12, which may be a contributing factor but probably not the primary etiology.  She endorsed right foot weakness while in the hospital.  Unclear etiology.  MRI negative for acute stroke and nothing surgical on lumbar MRI. 2  Transient acute encephalopathy - unclear etiology.  Possibly related to dehydration in conjunction with polypharmacy.  First will check MRI of cervical and thoracic spine If unremarkable, would evaluate for other causes (such as NCV-EMG of lower extremities) Follow up after testing.  Further recommendations pending results. Follow up with PCP regarding elevated blood pressure.   Subjective:  Alexandria Mclaughlin is an 80 year old female with HTN, HLD, s/p lumbar spine surgery, BPPV who presents for gait instability.  History supplemented by neurology, hospital and referring provider's notes.  She was seen by neurology for worsening balance in 2017.  MRI of brain showed extensive chronic small vessel ischemic changes including the pons.  Balance problems was thought to be due to a tiny MRI-negative brainstem infarct as patient reported double vision at the time.  Balance  problems subsequently resolved and she now wears corrective lenses for her double vision.  She has longstanding chronic low back pain.  She has history of lumbar spine surgery with post-laminectomy syndrome, lumbar facet arthropathy and sacroiliac joint dysfunction.  She takes multiple medications including fentanyl, oxycodone-acetaminophen, gabapentin, duloxetine, trazodone, clonazepam (for myoclonus).  She also has longstanding history of dizziness/BPPV for which she takes meclizine as needed.  However, she began having gradually progressive balance problems beginning around August 2022.  No falls or other preceding event.  No particular weakness in legs.  She has residual right foot and lower leg numbness from back surgery in 2020 but otherwise no numbness.  Denies bowel or bladder incontinence. No dizziness.  She just feels off balance.  She started using a cane for a month but then had to start using a walker.  She is unable to stand or ambulate without assistance  She was admitted to the hospital on 12/27/2020 due to confusion and slurred speech. She also had new right foot weakness (couldn't move her foot) that gradually improved over a couple of days.  MRI of brain personally reviewed again showed chronic small vessel ischemic changes but no acute abnormality.  CTA head and neck personally reviewed showed no LVO or hemodynamically significant stenosis.  No evidence of infection.  B12 was 239.  She had an MRI of lumbar spine which revealed lumbar spondylosis and degenerative disc disease causing moderate to prominent central narrowing of the thecal sac at L4-5 and moderate impingement at L3-4.  Neurosurgical intervention was felt not to be warranted.  No clear etiology for the transient encephalopathy but no recurrence.  Since  the hospitalization, her right leg will shake or jerk.  PAST MEDICAL HISTORY: Past Medical History:  Diagnosis Date   Cervical facet syndrome    Depression    Disorders of sacrum     Enthesopathy of hip region    GERD (gastroesophageal reflux disease)    Headache    History of kidney stones    Hypertension    Sciatic neuritis    Synovitis and tenosynovitis    Vision abnormalities     PAST SURGICAL HISTORY: Past Surgical History:  Procedure Laterality Date   ABDOMINAL HYSTERECTOMY     BACK SURGERY     2011   [place]     x2   RETINAL DETACHMENT SURGERY  9/13   TRANSFORAMINAL LUMBAR INTERBODY FUSION W/ MIS 1 LEVEL Right 04/26/2018   Procedure: Right Lumbar four-five Minimally invasive Transforaminal lumbar interbody fusion;  Surgeon: Judith Part, MD;  Location: [facility];  Service: Neurosurgery;  Laterality: Right;    MEDICATIONS: Current Outpatient Medications on File Prior to Visit  Medication Sig Dispense Refill   aspirin-acetaminophen-caffeine (EXCEDRIN MIGRAINE) 250-250-65 MG tablet Take 1 tablet by mouth every 6 (six) hours as needed for headache or migraine.     Calcium Carb-Cholecalciferol (CALCIUM+D3 PO) Take 1 tablet by mouth daily.     clonazePAM (KLONOPIN) 0.5 MG tablet Take 1 tablet (0.5 mg total) by mouth at bedtime as needed for anxiety (sleep). 10 tablet 0   DULoxetine (CYMBALTA) 30 MG capsule TAKE 1 CAPSULE BY MOUTH EVERY DAY (Patient taking differently: Take 30 mg by mouth daily.) 90 capsule 2   fentaNYL (DURAGESIC) 25 MCG/HR Place 1 patch onto the skin every 3 (three) days. 10 patch 0   gabapentin (NEURONTIN) 600 MG tablet TAKE 1 TABLET 4 TIMES A DAY (Patient taking differently: Take 600 mg by mouth in the morning, at noon, in the evening, and at bedtime.) 360 tablet 1   Meclizine HCl 25 MG CHEW Chew 25-50 mg by mouth 3 (three) times daily as needed (dizziness).     Melatonin 5 MG CAPS Take 5 mg by mouth at bedtime as needed (sleep).     Multiple Vitamin (MULTIVITAMIN WITH MINERALS) TABS tablet Take 1 tablet by mouth daily.     oxyCODONE-acetaminophen (PERCOCET) 10-325 MG tablet Take 1 tablet by mouth every  6 (six) hours as needed for pain. 110 tablet 0   polyethylene glycol (MIRALAX / GLYCOLAX) 17 g packet Take 17 g by mouth daily as needed for moderate constipation (not helped with psyllium).     psyllium (HYDROCIL/METAMUCIL) 95 % PACK Take 1 packet by mouth daily.     quinapril (ACCUPRIL) 20 MG tablet Take 20 mg by mouth daily.     rosuvastatin (CRESTOR) 20 MG tablet Take 20 mg by mouth at bedtime.     traZODone (DESYREL) 100 MG tablet TAKE 1 TABLET BY MOUTH EVERYDAY AT BEDTIME (Patient taking differently: Take 100 mg by mouth at bedtime.) 90 tablet 2   vitamin B-12 1000 MCG tablet Take 1 tablet (1,000 mcg total) by mouth daily.     No current facility-administered medications on file prior to visit.    ALLERGIES: No Known Allergies  FAMILY HISTORY: Family History  Problem Relation Age of Onset   Diabetes Mother    Heart disease Mother    Stroke Mother    Heart disease Father    Heart disease Brother     Objective:  Blood pressure (!) 161/89, pulse 85,  height 5\' 3"  (1.6 m), weight 141 lb 9.6 oz (64.2 kg), SpO2 92 %. General: No acute distress.  Patient appears well-groomed.   Head:  Normocephalic/atraumatic Eyes:  fundi examined but not visualized Neck: supple, no paraspinal tenderness, full range of motion Back: No paraspinal tenderness Heart: regular rate and rhythm Lungs: Clear to auscultation bilaterally. Vascular: No carotid bruits. Neurological Exam: Mental status: alert and oriented to person, place, and time, recent and remote memory intact, fund of knowledge intact, attention and concentration intact, speech fluent and not dysarthric, language intact. Cranial nerves: CN I: not tested CN II: pupils equal, round and reactive to light, visual fields intact CN III, IV, VI:  full range of motion, no nystagmus, no ptosis CN V: facial sensation intact. CN VII: upper and lower face symmetric CN VIII: hearing intact CN IX, X: gag intact, uvula midline CN XI:  sternocleidomastoid and trapezius muscles intact CN XII: tongue midline Bulk & Tone: normal, no fasciculations. Motor:  muscle strength 5/5 throughout Sensation:  Pinprick, temperature and vibratory sensation intact. Deep Tendon Reflexes:  2+ throughout,  toes downgoing.   Finger to nose testing:  Without dysmetria.   Heel to shin:  Without dysmetria.   Gait:  Broad-based.  Unsteady  Romberg with sway    Thank you for allowing me to take part in the care of this patient.  Metta Clines, DO  CC:  Merrilee Seashore, MD  Danella Sensing, NP

## 2021-02-06 ENCOUNTER — Ambulatory Visit: Payer: Medicare Other | Admitting: Neurology

## 2021-02-06 ENCOUNTER — Other Ambulatory Visit: Payer: Self-pay

## 2021-02-06 ENCOUNTER — Encounter: Payer: Self-pay | Admitting: Neurology

## 2021-02-06 VITALS — BP 161/89 | HR 85 | Ht 63.0 in | Wt 141.6 lb

## 2021-02-06 DIAGNOSIS — E538 Deficiency of other specified B group vitamins: Secondary | ICD-10-CM | POA: Diagnosis not present

## 2021-02-06 DIAGNOSIS — I10 Essential (primary) hypertension: Secondary | ICD-10-CM | POA: Diagnosis not present

## 2021-02-06 DIAGNOSIS — M21612 Bunion of left foot: Secondary | ICD-10-CM | POA: Diagnosis not present

## 2021-02-06 DIAGNOSIS — G9340 Encephalopathy, unspecified: Secondary | ICD-10-CM | POA: Diagnosis not present

## 2021-02-06 DIAGNOSIS — Z8673 Personal history of transient ischemic attack (TIA), and cerebral infarction without residual deficits: Secondary | ICD-10-CM | POA: Diagnosis not present

## 2021-02-06 DIAGNOSIS — M5136 Other intervertebral disc degeneration, lumbar region: Secondary | ICD-10-CM | POA: Diagnosis not present

## 2021-02-06 DIAGNOSIS — Z87442 Personal history of urinary calculi: Secondary | ICD-10-CM | POA: Diagnosis not present

## 2021-02-06 DIAGNOSIS — M47892 Other spondylosis, cervical region: Secondary | ICD-10-CM | POA: Diagnosis not present

## 2021-02-06 DIAGNOSIS — M48061 Spinal stenosis, lumbar region without neurogenic claudication: Secondary | ICD-10-CM | POA: Diagnosis not present

## 2021-02-06 DIAGNOSIS — R29898 Other symptoms and signs involving the musculoskeletal system: Secondary | ICD-10-CM

## 2021-02-06 DIAGNOSIS — G8929 Other chronic pain: Secondary | ICD-10-CM | POA: Diagnosis not present

## 2021-02-06 DIAGNOSIS — Z87891 Personal history of nicotine dependence: Secondary | ICD-10-CM | POA: Diagnosis not present

## 2021-02-06 DIAGNOSIS — R270 Ataxia, unspecified: Secondary | ICD-10-CM | POA: Diagnosis not present

## 2021-02-06 DIAGNOSIS — G253 Myoclonus: Secondary | ICD-10-CM | POA: Diagnosis not present

## 2021-02-06 DIAGNOSIS — M47816 Spondylosis without myelopathy or radiculopathy, lumbar region: Secondary | ICD-10-CM | POA: Diagnosis not present

## 2021-02-06 DIAGNOSIS — K219 Gastro-esophageal reflux disease without esophagitis: Secondary | ICD-10-CM | POA: Diagnosis not present

## 2021-02-06 DIAGNOSIS — H547 Unspecified visual loss: Secondary | ICD-10-CM | POA: Diagnosis not present

## 2021-02-06 NOTE — Patient Instructions (Signed)
We will first check MRI of cervical and thoracic spine without contrast If unremarkable, will check for other possible causes, such as nerve conduction study. Start B12 1038mcg daily.  Can get over the counter. Follow up after testing.

## 2021-02-10 DIAGNOSIS — G8929 Other chronic pain: Secondary | ICD-10-CM | POA: Diagnosis not present

## 2021-02-10 DIAGNOSIS — K219 Gastro-esophageal reflux disease without esophagitis: Secondary | ICD-10-CM | POA: Diagnosis not present

## 2021-02-10 DIAGNOSIS — M47892 Other spondylosis, cervical region: Secondary | ICD-10-CM | POA: Diagnosis not present

## 2021-02-10 DIAGNOSIS — Z87442 Personal history of urinary calculi: Secondary | ICD-10-CM | POA: Diagnosis not present

## 2021-02-10 DIAGNOSIS — M48061 Spinal stenosis, lumbar region without neurogenic claudication: Secondary | ICD-10-CM | POA: Diagnosis not present

## 2021-02-10 DIAGNOSIS — M5136 Other intervertebral disc degeneration, lumbar region: Secondary | ICD-10-CM | POA: Diagnosis not present

## 2021-02-10 DIAGNOSIS — E538 Deficiency of other specified B group vitamins: Secondary | ICD-10-CM | POA: Diagnosis not present

## 2021-02-10 DIAGNOSIS — I10 Essential (primary) hypertension: Secondary | ICD-10-CM | POA: Diagnosis not present

## 2021-02-10 DIAGNOSIS — M21612 Bunion of left foot: Secondary | ICD-10-CM | POA: Diagnosis not present

## 2021-02-10 DIAGNOSIS — M47816 Spondylosis without myelopathy or radiculopathy, lumbar region: Secondary | ICD-10-CM | POA: Diagnosis not present

## 2021-02-10 DIAGNOSIS — Z8673 Personal history of transient ischemic attack (TIA), and cerebral infarction without residual deficits: Secondary | ICD-10-CM | POA: Diagnosis not present

## 2021-02-10 DIAGNOSIS — H547 Unspecified visual loss: Secondary | ICD-10-CM | POA: Diagnosis not present

## 2021-02-10 DIAGNOSIS — Z87891 Personal history of nicotine dependence: Secondary | ICD-10-CM | POA: Diagnosis not present

## 2021-02-12 ENCOUNTER — Telehealth: Payer: Self-pay | Admitting: Neurology

## 2021-02-12 ENCOUNTER — Encounter: Payer: Medicare Other | Attending: Physical Medicine and Rehabilitation | Admitting: Registered Nurse

## 2021-02-12 ENCOUNTER — Encounter: Payer: Self-pay | Admitting: Registered Nurse

## 2021-02-12 ENCOUNTER — Other Ambulatory Visit: Payer: Self-pay

## 2021-02-12 VITALS — BP 140/85 | HR 77 | Temp 98.3°F | Ht 63.0 in | Wt 141.0 lb

## 2021-02-12 DIAGNOSIS — M5416 Radiculopathy, lumbar region: Secondary | ICD-10-CM

## 2021-02-12 DIAGNOSIS — Z79891 Long term (current) use of opiate analgesic: Secondary | ICD-10-CM | POA: Diagnosis not present

## 2021-02-12 DIAGNOSIS — R29898 Other symptoms and signs involving the musculoskeletal system: Secondary | ICD-10-CM | POA: Diagnosis not present

## 2021-02-12 DIAGNOSIS — G894 Chronic pain syndrome: Secondary | ICD-10-CM

## 2021-02-12 DIAGNOSIS — M961 Postlaminectomy syndrome, not elsewhere classified: Secondary | ICD-10-CM | POA: Diagnosis not present

## 2021-02-12 DIAGNOSIS — M7522 Bicipital tendinitis, left shoulder: Secondary | ICD-10-CM | POA: Diagnosis not present

## 2021-02-12 DIAGNOSIS — G253 Myoclonus: Secondary | ICD-10-CM | POA: Diagnosis not present

## 2021-02-12 DIAGNOSIS — Z5181 Encounter for therapeutic drug level monitoring: Secondary | ICD-10-CM | POA: Diagnosis not present

## 2021-02-12 DIAGNOSIS — M47816 Spondylosis without myelopathy or radiculopathy, lumbar region: Secondary | ICD-10-CM

## 2021-02-12 MED ORDER — OXYCODONE-ACETAMINOPHEN 10-325 MG PO TABS: 1.0000 | ORAL_TABLET | Freq: Four times a day (QID) | ORAL | 0 refills | Status: DC | PRN

## 2021-02-12 MED ORDER — FENTANYL 25 MCG/HR TD PT72: 1.0000 | MEDICATED_PATCH | TRANSDERMAL | 0 refills | Status: DC

## 2021-02-12 MED ORDER — CLONAZEPAM 0.5 MG PO TABS: 0.5000 mg | ORAL_TABLET | Freq: Every evening | ORAL | 0 refills | Status: DC | PRN

## 2021-02-12 NOTE — Telephone Encounter (Signed)
Patient called to get the phone number for the referral to [facility].   Alyse Low provided: [phone], number relayed to patient.  Patient will call to double check the referral has been received.

## 2021-02-12 NOTE — Telephone Encounter (Signed)
Patient would like to know what is going on with her referral for [facility].

## 2021-02-12 NOTE — Telephone Encounter (Signed)
Number given for patient to call [facility], due to not being scheduled yet.

## 2021-02-12 NOTE — Progress Notes (Signed)
Subjective:    Patient ID: Alexandria Mclaughlin, female    DOB: Jul 05, 1940, 80 y.o.   MRN: [id number]  HPI: Alexandria Mclaughlin is a 80 y.o. female who returns for follow up appointment for chronic pain and medication refill. She states her pain is located in her lower back radiating into her right hip and right foot numbness. She  rates her pain 4. Her current exercise regime is attending physical  and occupational therapy two days a week and  walking with her walker.   Alexandria Mclaughlin was admitted to hospital on 12/27/2020 and discharged to skill facility on 11/08/ 2022. She was admitted for altered mental status and impaired ambulation. Discharge summary was reviewed.   Alexandria Mclaughlin Morphine equivalent is 111.00 MME. She is also prescribed Clonazepam .We have discussed the black box warning of using opioids and benzodiazepines. I highlighted the dangers of using these drugs together and discussed the adverse events including respiratory suppression, overdose, cognitive impairment and importance of compliance with current regimen. We will continue to monitor and adjust as indicated.   Last UDS was Performed on 10/18/2020, it was consistent.     Pain Inventory Average Pain 4 Pain Right Now 4 My pain is intermittent, sharp, and stabbing  In the last 24 hours, has pain interfered with the following? General activity 6 Relation with others 6 Enjoyment of life 7 What TIME of day is your pain at its worst? daytime Sleep (in general) Fair  Pain is worse with: walking, bending, sitting, and standing Pain improves with: rest, heat/ice, and medication Relief from Meds: 6  Family History  Problem Relation Age of Onset   Diabetes Mother    Heart disease Mother    Stroke Mother    Heart disease Father    Heart disease Brother    Social History   Socioeconomic History   Marital status: Widowed    Spouse name: Not on file   Number of children: Not on file   Years of education: Not on file   Highest education  level: Not on file  Occupational History   Not on file  Tobacco Use   Smoking status: Former    Packs/day: 0.50    Years: 5.00    Pack years: 2.50    Types: Cigarettes    Quit date: 03/15/1968    Years since quitting: 52.9   Smokeless tobacco: Never  Vaping Use   Vaping Use: Never used  Substance and Sexual Activity   Alcohol use: Not Currently    Alcohol/week: 1.0 standard drink    Types: 1 Glasses of wine per week    Comment: occasional glass of wine   Drug use: No   Sexual activity: Not on file  Other Topics Concern   Not on file  Social History Narrative   Not on file   Social Determinants of Health   Financial Resource Strain: Not on file  Food Insecurity: Not on file  Transportation Needs: Not on file  Physical Activity: Not on file  Stress: Not on file  Social Connections: Not on file   Past Surgical History:  Procedure Laterality Date   [facility]     x2   RETINAL DETACHMENT SURGERY  9/13   TRANSFORAMINAL LUMBAR INTERBODY FUSION W/ MIS 1 LEVEL Right 04/26/2018   Procedure: Right Lumbar four-five Minimally invasive Transforaminal lumbar interbody fusion;  Surgeon: Judith Part, MD;  Location: [facility];  Service: Neurosurgery;  Laterality: Right;   Past Surgical History:  Procedure Laterality Date   ABDOMINAL HYSTERECTOMY     BACK SURGERY     2011   [facility]     x2   RETINAL DETACHMENT SURGERY  9/13   TRANSFORAMINAL LUMBAR INTERBODY FUSION W/ MIS 1 LEVEL Right 04/26/2018   Procedure: Right Lumbar four-five Minimally invasive Transforaminal lumbar interbody fusion;  Surgeon: Judith Part, MD;  Location: [facility];  Service: Neurosurgery;  Laterality: Right;   Past Medical History:  Diagnosis Date   Cervical facet syndrome    Depression    Disorders of sacrum    Enthesopathy of hip region    GERD (gastroesophageal reflux disease)     Headache    History of kidney stones    Hypertension    Sciatic neuritis    Synovitis and tenosynovitis    Vision abnormalities    There were no vitals taken for this visit.  Opioid Risk Score:   Fall Risk Score:  `1  Depression screen PHQ 2/9  Depression screen [facility] 2/9 12/13/2020 10/17/2020 07/24/2020 05/09/2020 09/15/2019 01/25/2018 10/12/2017  Decreased Interest 1 0 0 0 0 0 0  Down, Depressed, Hopeless 1 0 0 0 0 0 0  PHQ - 2 Score 2 0 0 0 0 0 0  Altered sleeping - - - - - - -  Tired, decreased energy - - - - - - -  Change in appetite - - - - - - -  Feeling bad or failure about yourself  - - - - - - -  Trouble concentrating - - - - - - -  Moving slowly or fidgety/restless - - - - - - -  Suicidal thoughts - - - - - - -  PHQ-9 Score - - - - - - -  Some recent data might be hidden    Review of Systems  Musculoskeletal:  Positive for back pain and gait problem.       Right foot pain, right buttock pain OFF BALANCE  All other systems reviewed and are negative.     Objective:   Physical Exam Vitals and nursing note reviewed.  Constitutional:      Appearance: Normal appearance.  Cardiovascular:     Rate and Rhythm: Normal rate and regular rhythm.     Pulses: Normal pulses.     Heart sounds: Normal heart sounds.  Pulmonary:     Effort: Pulmonary effort is normal.     Breath sounds: Normal breath sounds.  Musculoskeletal:     Cervical back: Normal range of motion and neck supple.     Comments: Normal Muscle Bulk and Muscle Testing Reveals:  Upper Extremities: Full ROM and Muscle Strength on Right 4/5 and Left 5/5 Lumbar Paraspinal Tenderness: L-3-L-5 Lower Extremities : Full ROM and Muscle Strength on Right 4/5 and Left 5/5 Arises from Table slowly using walker for support Antalgic Gait     Skin:    General: Skin is warm and dry.  Neurological:     Mental Status: She is alert and oriented to person, place, and time.  Psychiatric:        Mood and Affect: Mood normal.         Behavior: Behavior normal.         Assessment & Plan:  1. Facet Arthropathy/ Sacroiliac Joint Dysfunction: 02/12/2021. Refilled: Fentanyl 25 mcg patch  #  10- one patch every 3 days and   Percocet 10/325 mg one every 6 hours as needed  # 110,  Second script  given for the following month.  Continue to Monitor. . We will continue the opioid monitoring program, this consists of regular clinic visits, examinations, urine drug screen, pill counts as well as use of [place] Controlled Substance Reporting system. A 12 month History has been reviewed on the [facility] on 02/12/2021.  2. Lumbar Radicular  Pain: Continue HEP as Tolerated. Continue to Monitor. Continue Gabapentin and Cymbalta. 02/12/2021 3. Gait Disorder: Continue HEP / Neurology Following. 02/12/2021 4. Myoclonus with sleep: Continue Klonopin. Continue current medication regimen. Continue to Monitor. 02/12/2021     F/U in 2 months

## 2021-02-12 NOTE — Telephone Encounter (Signed)
Had to send to [facility], they do not have her in Epic. They will let us know when she is scheduled.

## 2021-02-13 ENCOUNTER — Other Ambulatory Visit: Payer: Self-pay | Admitting: Registered Nurse

## 2021-02-13 DIAGNOSIS — Z8673 Personal history of transient ischemic attack (TIA), and cerebral infarction without residual deficits: Secondary | ICD-10-CM | POA: Diagnosis not present

## 2021-02-13 DIAGNOSIS — M21612 Bunion of left foot: Secondary | ICD-10-CM | POA: Diagnosis not present

## 2021-02-13 DIAGNOSIS — Z87442 Personal history of urinary calculi: Secondary | ICD-10-CM | POA: Diagnosis not present

## 2021-02-13 DIAGNOSIS — G8929 Other chronic pain: Secondary | ICD-10-CM | POA: Diagnosis not present

## 2021-02-13 DIAGNOSIS — G253 Myoclonus: Secondary | ICD-10-CM

## 2021-02-13 DIAGNOSIS — M5136 Other intervertebral disc degeneration, lumbar region: Secondary | ICD-10-CM | POA: Diagnosis not present

## 2021-02-13 DIAGNOSIS — Z87891 Personal history of nicotine dependence: Secondary | ICD-10-CM | POA: Diagnosis not present

## 2021-02-13 DIAGNOSIS — M47892 Other spondylosis, cervical region: Secondary | ICD-10-CM | POA: Diagnosis not present

## 2021-02-13 DIAGNOSIS — H547 Unspecified visual loss: Secondary | ICD-10-CM | POA: Diagnosis not present

## 2021-02-13 DIAGNOSIS — I10 Essential (primary) hypertension: Secondary | ICD-10-CM | POA: Diagnosis not present

## 2021-02-13 DIAGNOSIS — K219 Gastro-esophageal reflux disease without esophagitis: Secondary | ICD-10-CM | POA: Diagnosis not present

## 2021-02-13 DIAGNOSIS — M47816 Spondylosis without myelopathy or radiculopathy, lumbar region: Secondary | ICD-10-CM | POA: Diagnosis not present

## 2021-02-13 DIAGNOSIS — M48061 Spinal stenosis, lumbar region without neurogenic claudication: Secondary | ICD-10-CM | POA: Diagnosis not present

## 2021-02-13 DIAGNOSIS — E538 Deficiency of other specified B group vitamins: Secondary | ICD-10-CM | POA: Diagnosis not present

## 2021-02-14 DIAGNOSIS — G894 Chronic pain syndrome: Secondary | ICD-10-CM | POA: Diagnosis not present

## 2021-02-14 DIAGNOSIS — R262 Difficulty in walking, not elsewhere classified: Secondary | ICD-10-CM | POA: Diagnosis not present

## 2021-02-14 DIAGNOSIS — R29898 Other symptoms and signs involving the musculoskeletal system: Secondary | ICD-10-CM | POA: Diagnosis not present

## 2021-02-14 DIAGNOSIS — G9341 Metabolic encephalopathy: Secondary | ICD-10-CM | POA: Diagnosis not present

## 2021-02-14 NOTE — Telephone Encounter (Signed)
Per Marcene Brawn at [facility], Pt has a Detectable she had to meet so that why she has to pay $115.00 so it can towards that.  But she is schedule for 02/22/21.

## 2021-02-14 NOTE — Telephone Encounter (Signed)
Patient called and said she was told she'd have to pay $115.00 up front before the MRI test at [facility].  She said when it tried to pay the fee, there was a problem with it going through and she was told by [facility] to call the office for help.

## 2021-02-15 DIAGNOSIS — Z8673 Personal history of transient ischemic attack (TIA), and cerebral infarction without residual deficits: Secondary | ICD-10-CM | POA: Diagnosis not present

## 2021-02-15 DIAGNOSIS — Z87891 Personal history of nicotine dependence: Secondary | ICD-10-CM | POA: Diagnosis not present

## 2021-02-15 DIAGNOSIS — G8929 Other chronic pain: Secondary | ICD-10-CM | POA: Diagnosis not present

## 2021-02-15 DIAGNOSIS — I10 Essential (primary) hypertension: Secondary | ICD-10-CM | POA: Diagnosis not present

## 2021-02-15 DIAGNOSIS — M48061 Spinal stenosis, lumbar region without neurogenic claudication: Secondary | ICD-10-CM | POA: Diagnosis not present

## 2021-02-15 DIAGNOSIS — M5136 Other intervertebral disc degeneration, lumbar region: Secondary | ICD-10-CM | POA: Diagnosis not present

## 2021-02-15 DIAGNOSIS — E538 Deficiency of other specified B group vitamins: Secondary | ICD-10-CM | POA: Diagnosis not present

## 2021-02-15 DIAGNOSIS — H547 Unspecified visual loss: Secondary | ICD-10-CM | POA: Diagnosis not present

## 2021-02-15 DIAGNOSIS — M47816 Spondylosis without myelopathy or radiculopathy, lumbar region: Secondary | ICD-10-CM | POA: Diagnosis not present

## 2021-02-15 DIAGNOSIS — Z87442 Personal history of urinary calculi: Secondary | ICD-10-CM | POA: Diagnosis not present

## 2021-02-15 DIAGNOSIS — M47892 Other spondylosis, cervical region: Secondary | ICD-10-CM | POA: Diagnosis not present

## 2021-02-15 DIAGNOSIS — M21612 Bunion of left foot: Secondary | ICD-10-CM | POA: Diagnosis not present

## 2021-02-15 DIAGNOSIS — K219 Gastro-esophageal reflux disease without esophagitis: Secondary | ICD-10-CM | POA: Diagnosis not present

## 2021-02-19 DIAGNOSIS — E538 Deficiency of other specified B group vitamins: Secondary | ICD-10-CM | POA: Diagnosis not present

## 2021-02-19 DIAGNOSIS — M5136 Other intervertebral disc degeneration, lumbar region: Secondary | ICD-10-CM | POA: Diagnosis not present

## 2021-02-19 DIAGNOSIS — M21612 Bunion of left foot: Secondary | ICD-10-CM | POA: Diagnosis not present

## 2021-02-19 DIAGNOSIS — M47816 Spondylosis without myelopathy or radiculopathy, lumbar region: Secondary | ICD-10-CM | POA: Diagnosis not present

## 2021-02-19 DIAGNOSIS — K219 Gastro-esophageal reflux disease without esophagitis: Secondary | ICD-10-CM | POA: Diagnosis not present

## 2021-02-19 DIAGNOSIS — Z87442 Personal history of urinary calculi: Secondary | ICD-10-CM | POA: Diagnosis not present

## 2021-02-19 DIAGNOSIS — I10 Essential (primary) hypertension: Secondary | ICD-10-CM | POA: Diagnosis not present

## 2021-02-19 DIAGNOSIS — M47892 Other spondylosis, cervical region: Secondary | ICD-10-CM | POA: Diagnosis not present

## 2021-02-19 DIAGNOSIS — Z8673 Personal history of transient ischemic attack (TIA), and cerebral infarction without residual deficits: Secondary | ICD-10-CM | POA: Diagnosis not present

## 2021-02-19 DIAGNOSIS — H547 Unspecified visual loss: Secondary | ICD-10-CM | POA: Diagnosis not present

## 2021-02-19 DIAGNOSIS — M48061 Spinal stenosis, lumbar region without neurogenic claudication: Secondary | ICD-10-CM | POA: Diagnosis not present

## 2021-02-19 DIAGNOSIS — G8929 Other chronic pain: Secondary | ICD-10-CM | POA: Diagnosis not present

## 2021-02-19 DIAGNOSIS — Z87891 Personal history of nicotine dependence: Secondary | ICD-10-CM | POA: Diagnosis not present

## 2021-02-20 DIAGNOSIS — M48061 Spinal stenosis, lumbar region without neurogenic claudication: Secondary | ICD-10-CM | POA: Diagnosis not present

## 2021-02-20 DIAGNOSIS — Z8673 Personal history of transient ischemic attack (TIA), and cerebral infarction without residual deficits: Secondary | ICD-10-CM | POA: Diagnosis not present

## 2021-02-20 DIAGNOSIS — G8929 Other chronic pain: Secondary | ICD-10-CM | POA: Diagnosis not present

## 2021-02-20 DIAGNOSIS — M47816 Spondylosis without myelopathy or radiculopathy, lumbar region: Secondary | ICD-10-CM | POA: Diagnosis not present

## 2021-02-20 DIAGNOSIS — K219 Gastro-esophageal reflux disease without esophagitis: Secondary | ICD-10-CM | POA: Diagnosis not present

## 2021-02-20 DIAGNOSIS — Z87442 Personal history of urinary calculi: Secondary | ICD-10-CM | POA: Diagnosis not present

## 2021-02-20 DIAGNOSIS — M47892 Other spondylosis, cervical region: Secondary | ICD-10-CM | POA: Diagnosis not present

## 2021-02-20 DIAGNOSIS — M5136 Other intervertebral disc degeneration, lumbar region: Secondary | ICD-10-CM | POA: Diagnosis not present

## 2021-02-20 DIAGNOSIS — H547 Unspecified visual loss: Secondary | ICD-10-CM | POA: Diagnosis not present

## 2021-02-20 DIAGNOSIS — Z87891 Personal history of nicotine dependence: Secondary | ICD-10-CM | POA: Diagnosis not present

## 2021-02-20 DIAGNOSIS — I10 Essential (primary) hypertension: Secondary | ICD-10-CM | POA: Diagnosis not present

## 2021-02-20 DIAGNOSIS — M21612 Bunion of left foot: Secondary | ICD-10-CM | POA: Diagnosis not present

## 2021-02-20 DIAGNOSIS — E538 Deficiency of other specified B group vitamins: Secondary | ICD-10-CM | POA: Diagnosis not present

## 2021-02-22 DIAGNOSIS — M47812 Spondylosis without myelopathy or radiculopathy, cervical region: Secondary | ICD-10-CM | POA: Diagnosis not present

## 2021-02-22 DIAGNOSIS — R270 Ataxia, unspecified: Secondary | ICD-10-CM | POA: Diagnosis not present

## 2021-02-22 DIAGNOSIS — I10 Essential (primary) hypertension: Secondary | ICD-10-CM | POA: Diagnosis not present

## 2021-02-22 DIAGNOSIS — M810 Age-related osteoporosis without current pathological fracture: Secondary | ICD-10-CM | POA: Diagnosis not present

## 2021-02-22 DIAGNOSIS — E782 Mixed hyperlipidemia: Secondary | ICD-10-CM | POA: Diagnosis not present

## 2021-02-22 DIAGNOSIS — M47814 Spondylosis without myelopathy or radiculopathy, thoracic region: Secondary | ICD-10-CM | POA: Diagnosis not present

## 2021-02-27 ENCOUNTER — Telehealth: Payer: Self-pay | Admitting: Neurology

## 2021-02-27 DIAGNOSIS — R270 Ataxia, unspecified: Secondary | ICD-10-CM

## 2021-02-27 NOTE — Telephone Encounter (Signed)
Pt called in stating she had an MRI on Friday and would like to get her results

## 2021-02-27 NOTE — Telephone Encounter (Signed)
Patient advised MRI received from [facility], once Dr.Jaffe reviews it we will call her with the results.

## 2021-02-27 NOTE — Telephone Encounter (Signed)
I have the MRI report of the cervical spine.  It shows arthritis that is causing narrowing in the spinal canal but it doesn't specifically mention if there is pressing on the spinal cord.  The image does not seem to be on [facility] - I don't know if it takes a few days to upload it.  I would like to view the imaging myself.    Pt advised of MRI Results.  Per pt she states everything has gotten worse. She is walking into walls.  Please advise what to do. Pt state she is desperate.

## 2021-02-28 DIAGNOSIS — R7989 Other specified abnormal findings of blood chemistry: Secondary | ICD-10-CM | POA: Diagnosis not present

## 2021-02-28 DIAGNOSIS — I10 Essential (primary) hypertension: Secondary | ICD-10-CM | POA: Diagnosis not present

## 2021-02-28 DIAGNOSIS — E782 Mixed hyperlipidemia: Secondary | ICD-10-CM | POA: Diagnosis not present

## 2021-02-28 DIAGNOSIS — Z01818 Encounter for other preprocedural examination: Secondary | ICD-10-CM | POA: Diagnosis not present

## 2021-02-28 DIAGNOSIS — R7303 Prediabetes: Secondary | ICD-10-CM | POA: Diagnosis not present

## 2021-03-01 ENCOUNTER — Ambulatory Visit
Admission: RE | Admit: 2021-03-01 | Discharge: 2021-03-01 | Disposition: A | Discharge status: Home / Self Care | Payer: Self-pay | Source: Ambulatory Visit | Attending: Neurology | Admitting: Neurology

## 2021-03-01 DIAGNOSIS — R270 Ataxia, unspecified: Secondary | ICD-10-CM

## 2021-03-01 NOTE — Telephone Encounter (Signed)
Called [facility] to have images uploaded.

## 2021-03-01 NOTE — Addendum Note (Signed)
Addended by: Venetia Night on: 03/01/2021 10:40 AM   Modules accepted: Orders

## 2021-03-01 NOTE — Telephone Encounter (Signed)
Pt advised to have [facility] send over fils or CD.

## 2021-03-05 ENCOUNTER — Telehealth: Payer: Self-pay | Admitting: Neurology

## 2021-03-05 DIAGNOSIS — E782 Mixed hyperlipidemia: Secondary | ICD-10-CM | POA: Diagnosis not present

## 2021-03-05 DIAGNOSIS — R269 Unspecified abnormalities of gait and mobility: Secondary | ICD-10-CM | POA: Diagnosis not present

## 2021-03-05 DIAGNOSIS — I10 Essential (primary) hypertension: Secondary | ICD-10-CM | POA: Diagnosis not present

## 2021-03-05 DIAGNOSIS — R7303 Prediabetes: Secondary | ICD-10-CM | POA: Diagnosis not present

## 2021-03-05 DIAGNOSIS — E0590 Thyrotoxicosis, unspecified without thyrotoxic crisis or storm: Secondary | ICD-10-CM | POA: Diagnosis not present

## 2021-03-05 NOTE — Telephone Encounter (Signed)
Pt called back in wanting to speak with [facility]

## 2021-03-05 NOTE — Telephone Encounter (Signed)
Tried calling patient, no answer. LMOVM. Reviewed imaging.  I don't see anything that would be significant enough to cause balance problems.  Recommend nerve conduction study of bilateral lower extremities for balance disorder

## 2021-03-05 NOTE — Telephone Encounter (Signed)
2nd attempt at calling pt. LMOVM as well as sent a Mychart message of Dr.Jaffe recommendations.

## 2021-03-05 NOTE — Telephone Encounter (Signed)
Patient advise of EMG recommendation.  Pt agrees to try it.   Front desk when you get a chance can you call the patient to schedule.

## 2021-03-05 NOTE — Addendum Note (Signed)
Addended by: Venetia Night on: 03/05/2021 01:24 PM   Modules accepted: Orders

## 2021-03-05 NOTE — Telephone Encounter (Signed)
See 02/28/21 note.

## 2021-03-05 NOTE — Telephone Encounter (Signed)
LMOM to sch the Bilat lower EMG

## 2021-03-05 NOTE — Telephone Encounter (Signed)
Pt called in and left a message. She is returning a call from [place]. Needs to speak with someone.

## 2021-03-06 ENCOUNTER — Telehealth: Payer: Self-pay

## 2021-03-06 NOTE — Telephone Encounter (Signed)
Received a PA for Fentanyl from [facility]. When attempting to initiate PA, CoverMyMeds stated that it did not need one. Called [facility] and they stated that the medication went through and was picked up by the patient.

## 2021-03-07 DIAGNOSIS — R2681 Unsteadiness on feet: Secondary | ICD-10-CM | POA: Diagnosis not present

## 2021-03-07 DIAGNOSIS — I700 Atherosclerosis of aorta: Secondary | ICD-10-CM | POA: Diagnosis not present

## 2021-03-07 DIAGNOSIS — F1190 Opioid use, unspecified, uncomplicated: Secondary | ICD-10-CM | POA: Diagnosis not present

## 2021-03-07 DIAGNOSIS — I10 Essential (primary) hypertension: Secondary | ICD-10-CM | POA: Diagnosis not present

## 2021-03-07 DIAGNOSIS — E0590 Thyrotoxicosis, unspecified without thyrotoxic crisis or storm: Secondary | ICD-10-CM | POA: Diagnosis not present

## 2021-03-07 DIAGNOSIS — E041 Nontoxic single thyroid nodule: Secondary | ICD-10-CM | POA: Diagnosis not present

## 2021-03-13 DIAGNOSIS — R262 Difficulty in walking, not elsewhere classified: Secondary | ICD-10-CM | POA: Diagnosis not present

## 2021-03-13 DIAGNOSIS — R260 Ataxic gait: Secondary | ICD-10-CM | POA: Diagnosis not present

## 2021-03-13 DIAGNOSIS — M6281 Muscle weakness (generalized): Secondary | ICD-10-CM | POA: Diagnosis not present

## 2021-03-14 ENCOUNTER — Telehealth: Payer: Self-pay | Admitting: Registered Nurse

## 2021-03-14 DIAGNOSIS — M7522 Bicipital tendinitis, left shoulder: Secondary | ICD-10-CM

## 2021-03-14 MED ORDER — OXYCODONE-ACETAMINOPHEN 10-325 MG PO TABS: 1.0000 | ORAL_TABLET | Freq: Four times a day (QID) | ORAL | 0 refills | Status: DC | PRN

## 2021-03-14 NOTE — Telephone Encounter (Signed)
PMP was Reviewed.  Oxycodone E-scribed today.  Placed a call to Ms. Babinski, she verbalizes understanding.

## 2021-03-19 ENCOUNTER — Ambulatory Visit: Payer: Medicare Other | Admitting: Neurology

## 2021-03-19 DIAGNOSIS — M6281 Muscle weakness (generalized): Secondary | ICD-10-CM | POA: Diagnosis not present

## 2021-03-19 DIAGNOSIS — R262 Difficulty in walking, not elsewhere classified: Secondary | ICD-10-CM | POA: Diagnosis not present

## 2021-03-19 DIAGNOSIS — R260 Ataxic gait: Secondary | ICD-10-CM | POA: Diagnosis not present

## 2021-03-21 DIAGNOSIS — M6281 Muscle weakness (generalized): Secondary | ICD-10-CM | POA: Diagnosis not present

## 2021-03-21 DIAGNOSIS — R260 Ataxic gait: Secondary | ICD-10-CM | POA: Diagnosis not present

## 2021-03-21 DIAGNOSIS — R262 Difficulty in walking, not elsewhere classified: Secondary | ICD-10-CM | POA: Diagnosis not present

## 2021-03-22 ENCOUNTER — Telehealth: Payer: Self-pay | Admitting: Registered Nurse

## 2021-03-22 DIAGNOSIS — G253 Myoclonus: Secondary | ICD-10-CM

## 2021-03-22 NOTE — Telephone Encounter (Signed)
Refill request for Clonazepam 0.5 mg

## 2021-03-22 NOTE — Telephone Encounter (Signed)
PMP was Reviewed.  Clonazepam e- scribed today.  Placed a call to Ms. Coello, she verbalizes understanding.

## 2021-03-26 DIAGNOSIS — I10 Essential (primary) hypertension: Secondary | ICD-10-CM | POA: Diagnosis not present

## 2021-03-26 DIAGNOSIS — M810 Age-related osteoporosis without current pathological fracture: Secondary | ICD-10-CM | POA: Diagnosis not present

## 2021-03-26 DIAGNOSIS — E782 Mixed hyperlipidemia: Secondary | ICD-10-CM | POA: Diagnosis not present

## 2021-04-01 ENCOUNTER — Telehealth: Payer: Self-pay | Admitting: Registered Nurse

## 2021-04-01 MED ORDER — FENTANYL 25 MCG/HR TD PT72: 1.0000 | MEDICATED_PATCH | TRANSDERMAL | 0 refills | Status: DC

## 2021-04-01 NOTE — Telephone Encounter (Signed)
PMP was Reviewed.  Fentanyl 25 MCG e-scribed today.  Alexandria Mclaughlin is aware of the above via My-Chart Message.

## 2021-04-04 ENCOUNTER — Ambulatory Visit: Payer: Medicare HMO | Admitting: Neurology

## 2021-04-04 ENCOUNTER — Encounter: Payer: Self-pay | Admitting: Neurology

## 2021-04-04 ENCOUNTER — Other Ambulatory Visit: Payer: Self-pay

## 2021-04-04 DIAGNOSIS — E0590 Thyrotoxicosis, unspecified without thyrotoxic crisis or storm: Secondary | ICD-10-CM | POA: Diagnosis not present

## 2021-04-04 DIAGNOSIS — M5417 Radiculopathy, lumbosacral region: Secondary | ICD-10-CM

## 2021-04-04 DIAGNOSIS — R270 Ataxia, unspecified: Secondary | ICD-10-CM | POA: Diagnosis not present

## 2021-04-04 NOTE — Procedures (Signed)
[facility]  [address], [place]  [place], [place] [address] Tel: [phone] Fax:  [phone] Test Date:  04/04/2021  Patient: Alexandria Mclaughlin DOB: Mar 22, 1940 Physician: Narda Amber, DO  Sex: Female Height: 5\' 3"  Ref Phys: Metta Clines, D.O.  ID#: [id number]   Technician:    Patient Complaints: This is AN 81 year old female referred for evaluation unsteady gait.  NCV & EMG Findings: Extensive electrodiagnostic testing of the right lower extremity and additional studies of the left shows:  Bilateral sural and superficial peroneal sensory responses are within normal limits. Peroneal motor response at the extensor digitorum brevis on the right is absent, and shows reduced amplitude on the left (1.3 mV).  Bilateral peroneal motor responses at the tibialis anterior and tibial motor responses are within normal limits.   Bilateral tibial H reflex studies are within normal limits.   Chronic motor axonal loss changes are seen affecting the right L5-S1 myotome, without accompanied active denervation.  These findings are not present in the left lower extremity.    Impression: Chronic L5-S1 radiculopathy affecting the right lower extremity, mild-to moderate. There is no evidence of a large fiber sensorimotor polyneuropathy affecting the lower extremities.   ___________________________ Narda Amber, DO    Nerve Conduction Studies Anti Sensory Summary Table   Stim Site NR Peak (ms) Norm Peak (ms) P-T Amp (V) Norm P-T Amp  Left Sup Peroneal Anti Sensory (Ant Lat Mall)  32C  12 cm    2.5 <4.6 7.3 >3  Right Sup Peroneal Anti Sensory (Ant Lat Mall)  32C  12 cm    2.0 <4.6 6.4 >3  Left Sural Anti Sensory (Lat Mall)  32C  Calf    2.8 <4.6 9.2 >3  Right Sural Anti Sensory (Lat Mall)  32C  Calf    2.4 <4.6 10.0 >3   Motor Summary Table   Stim Site NR Onset (ms) Norm Onset (ms) O-P Amp (mV) Norm O-P Amp Site1 Site2 Delta-0 (ms) Dist (cm) Vel (m/s) Norm Vel (m/s)  Left  Peroneal Motor (Ext Dig Brev)  32C  Ankle    4.9 <6.0 1.3 >2.5 B Fib Ankle 6.7 34.0 51 >40  B Fib    11.6  1.1  Poplt B Fib 1.4 7.0 50 >40  Poplt    13.0  1.0         Right Peroneal Motor (Ext Dig Brev)  32C  Ankle NR  <6.0  >2.5 B Fib Ankle  0.0  >40  B Fib NR     Poplt B Fib  0.0  >40  Poplt NR            Left Peroneal TA Motor (Tib Ant)  32C  Fib Head    2.5 <4.5 4.6 >3 Poplit Fib Head 1.2 7.0 58 >40  Poplit    3.7  4.3         Right Peroneal TA Motor (Tib Ant)  32C  Fib Head    3.4 <4.5 3.5 >3 Poplit Fib Head 1.7 7.0 41 >40  Poplit    5.1  3.3         Left Tibial Motor (Abd Hall Brev)  32C  Ankle    3.3 <6.0 7.4 >4 Knee Ankle 8.1 39.0 48 >40  Knee    11.4  4.7         Right Tibial Motor (Abd Hall Brev)  32C  Ankle    3.5 <6.0 4.3 >4 Knee Ankle 8.7 41.0 47 >  40  Knee    12.2  2.8          H Reflex Studies   NR H-Lat (ms) Lat Norm (ms) L-R H-Lat (ms)  Left Tibial (Gastroc)  32C     31.43 <35 0.00  Right Tibial (Gastroc)  32C     31.43 <35 0.00   EMG   Side Muscle Ins Act Fibs Psw Fasc Number Recrt Dur Dur. Amp Amp. Poly Poly. Comment  Right AntTibialis Nml Nml Nml Nml 1- Rapid Some 1+ Some 1+ Some 1+ N/A  Right Gastroc Nml Nml Nml Nml 1- Rapid Some 1+ Some 1+ Some 1+ N/A  Right Flex Dig Long Nml Nml Nml Nml 3- Rapid Many 1+ Many 1+ Some 1+ N/A  Right RectFemoris Nml Nml Nml Nml Nml Nml Nml Nml Nml Nml Nml Nml N/A  Right GluteusMed Nml Nml Nml Nml 1- Rapid Some 1+ Some 1+ Some 1+ N/A  Right BicepsFemS Nml Nml Nml Nml 1- Rapid Few 1+ Few 1+ Few 1+ N/A  Left BicepsFemS Nml Nml Nml Nml Nml Nml Nml Nml Nml Nml Nml Nml N/A  Left AntTibialis Nml Nml Nml Nml Nml Nml Nml Nml Nml Nml Nml Nml N/A  Left Gastroc Nml Nml Nml Nml Nml Nml Nml Nml Nml Nml Nml Nml N/A  Left Flex Dig Long Nml Nml Nml Nml Nml Nml Nml Nml Nml Nml Nml Nml N/A  Left RectFemoris Nml Nml Nml Nml Nml Nml Nml Nml Nml Nml Nml Nml N/A  Left GluteusMed Nml Nml Nml Nml Nml Nml Nml Nml Nml Nml Nml Nml N/A       Waveforms:

## 2021-04-08 ENCOUNTER — Other Ambulatory Visit: Payer: Self-pay

## 2021-04-08 DIAGNOSIS — G2581 Restless legs syndrome: Secondary | ICD-10-CM

## 2021-04-08 NOTE — Progress Notes (Signed)
Per Dr.Jaffe,Nerve conduction study is overall unremarkable.  I would like to check an EEG to evaluate the right leg shaking and then have her return for follow up visit so that I can re-examine her to see if I note any new findings.

## 2021-04-09 DIAGNOSIS — M6281 Muscle weakness (generalized): Secondary | ICD-10-CM | POA: Diagnosis not present

## 2021-04-09 DIAGNOSIS — R260 Ataxic gait: Secondary | ICD-10-CM | POA: Diagnosis not present

## 2021-04-09 DIAGNOSIS — R262 Difficulty in walking, not elsewhere classified: Secondary | ICD-10-CM | POA: Diagnosis not present

## 2021-04-10 ENCOUNTER — Other Ambulatory Visit: Payer: Self-pay

## 2021-04-10 ENCOUNTER — Telehealth: Payer: Self-pay | Admitting: Physical Medicine & Rehabilitation

## 2021-04-10 ENCOUNTER — Other Ambulatory Visit: Payer: Self-pay | Admitting: Registered Nurse

## 2021-04-10 ENCOUNTER — Ambulatory Visit: Payer: Medicare HMO | Admitting: Neurology

## 2021-04-10 DIAGNOSIS — G2581 Restless legs syndrome: Secondary | ICD-10-CM | POA: Diagnosis not present

## 2021-04-10 NOTE — Telephone Encounter (Signed)
Refill request Trazodone. No mention in last note to continue.

## 2021-04-10 NOTE — Telephone Encounter (Signed)
[facility] was called: Last refill  on Trazodone was filled on 01/23/2021: three month supply.  Placed a call to Alexandria Mclaughlin she's  taking her Trazodone nightly without any side effects.  Trazodone ordered, she verbalizes understanding.

## 2021-04-11 DIAGNOSIS — R262 Difficulty in walking, not elsewhere classified: Secondary | ICD-10-CM | POA: Diagnosis not present

## 2021-04-11 DIAGNOSIS — R260 Ataxic gait: Secondary | ICD-10-CM | POA: Diagnosis not present

## 2021-04-11 DIAGNOSIS — M6281 Muscle weakness (generalized): Secondary | ICD-10-CM | POA: Diagnosis not present

## 2021-04-11 NOTE — Procedures (Signed)
ELECTROENCEPHALOGRAM REPORT  Date of Study: 04/10/2021  Patient's Name: Alexandria Mclaughlin MRN: [id number] Date of Birth: [birth year]   Clinical History: 81 year old female with history of lumbar spine surgery and BPPV presents for gait instability as well as shaking or jerking of right leg.  Medications: EXCEDRIN MIGRAINE 250-250-65 MG tablet CALCIUM+D3 PO KLONOPIN 0.5 MG tablet CYMBALTA 30 MG capsule DURAGESIC 25 MCG/HR NEURONTIN 600 MG tablet Meclizine HCl 25 MG CHEW Melatonin 5 MG CAPS MULTIVITAMIN WITH MINERALS TABS tablet PERCOCET 10-325 MG tablet MIRALAX / GLYCOLAX 17 g packet HYDROCIL/METAMUCIL 95 % PACK ACCUPRIL 20 MG table CRESTOR 20 MG tablet DESYREL 100 MG tablet vitamin B-12 1000 MCG tablet  Technical Summary: A multichannel digital EEG recording measured by the international 10-20 system with electrodes applied with paste and impedances below 5000 ohms performed in our laboratory with EKG monitoring in an awake and drowsy patient.  Photic stimulation was performed.  The digital EEG was referentially recorded, reformatted, and digitally filtered in a variety of bipolar and referential montages for optimal display.    Description: The patient is awake and drowsy during the recording.  During maximal wakefulness, there is a symmetric, medium voltage 9 Hz posterior dominant rhythm that attenuates with eye opening.  The record is symmetric.  During drowsiness, there is an increase in theta slowing of the background.  Stage 2 sleep was not seen.  Photic stimulation did not elicit any abnormalities.  There were no epileptiform discharges or electrographic seizures seen.    EKG lead was unremarkable.  Impression: This awake and drowsy EEG is normal.    Clinical Correlation: A normal EEG does not exclude a clinical diagnosis of epilepsy.  If further clinical questions remain, prolonged EEG may be helpful.  Clinical correlation is advised.   Metta Clines, DO

## 2021-04-11 NOTE — Progress Notes (Signed)
Patient advised of her EEG results. Pt wants to know why she is walking into to walls, walking backwards, and walking like a drunk.

## 2021-04-16 ENCOUNTER — Encounter: Payer: Medicare HMO | Admitting: Registered Nurse

## 2021-04-16 DIAGNOSIS — R260 Ataxic gait: Secondary | ICD-10-CM | POA: Diagnosis not present

## 2021-04-16 DIAGNOSIS — R262 Difficulty in walking, not elsewhere classified: Secondary | ICD-10-CM | POA: Diagnosis not present

## 2021-04-16 DIAGNOSIS — M6281 Muscle weakness (generalized): Secondary | ICD-10-CM | POA: Diagnosis not present

## 2021-04-17 ENCOUNTER — Encounter: Payer: Medicare HMO | Attending: Physical Medicine and Rehabilitation | Admitting: Registered Nurse

## 2021-04-17 ENCOUNTER — Other Ambulatory Visit: Payer: Self-pay

## 2021-04-17 VITALS — BP 137/73 | HR 84 | Ht 63.0 in | Wt 144.4 lb

## 2021-04-17 DIAGNOSIS — M5416 Radiculopathy, lumbar region: Secondary | ICD-10-CM | POA: Insufficient documentation

## 2021-04-17 DIAGNOSIS — Z5181 Encounter for therapeutic drug level monitoring: Secondary | ICD-10-CM | POA: Insufficient documentation

## 2021-04-17 DIAGNOSIS — M47816 Spondylosis without myelopathy or radiculopathy, lumbar region: Secondary | ICD-10-CM | POA: Diagnosis not present

## 2021-04-17 DIAGNOSIS — M7522 Bicipital tendinitis, left shoulder: Secondary | ICD-10-CM | POA: Insufficient documentation

## 2021-04-17 DIAGNOSIS — R269 Unspecified abnormalities of gait and mobility: Secondary | ICD-10-CM | POA: Diagnosis not present

## 2021-04-17 DIAGNOSIS — Z79891 Long term (current) use of opiate analgesic: Secondary | ICD-10-CM | POA: Insufficient documentation

## 2021-04-17 DIAGNOSIS — G253 Myoclonus: Secondary | ICD-10-CM | POA: Insufficient documentation

## 2021-04-17 DIAGNOSIS — G894 Chronic pain syndrome: Secondary | ICD-10-CM | POA: Insufficient documentation

## 2021-04-17 DIAGNOSIS — M961 Postlaminectomy syndrome, not elsewhere classified: Secondary | ICD-10-CM | POA: Diagnosis not present

## 2021-04-17 MED ORDER — CLONAZEPAM 0.5 MG PO TABS: 0.5000 mg | ORAL_TABLET | Freq: Every evening | ORAL | 2 refills | Status: DC | PRN

## 2021-04-17 MED ORDER — OXYCODONE-ACETAMINOPHEN 10-325 MG PO TABS: 1.0000 | ORAL_TABLET | Freq: Four times a day (QID) | ORAL | 0 refills | Status: DC | PRN

## 2021-04-17 MED ORDER — FENTANYL 25 MCG/HR TD PT72: 1.0000 | MEDICATED_PATCH | TRANSDERMAL | 0 refills | Status: DC

## 2021-04-17 NOTE — Progress Notes (Signed)
Subjective:    Patient ID: Alexandria Mclaughlin, female    DOB: 1940/11/07, 81 y.o.   MRN: [id number]  HPI: Alexandria Mclaughlin is a 81 y.o. female who returns for follow up appointment for chronic pain and medication refill. She states her pain is located in her lower back pain radiating into her right buttock. She rates her pain 4. Her current exercise regime is walking with walkerand performing stretching exercises. She also attending physical therapy twice  week  Alexandria Mclaughlin Morphine equivalent is 110.85 MME.She  is also prescribed Clonazepam  .We have discussed the black box warning of using opioids and benzodiazepines. I highlighted the dangers of using these drugs together and discussed the adverse events including respiratory suppression, overdose, cognitive impairment and importance of compliance with current regimen. We will continue to monitor and adjust as indicated.   Alexandria Mclaughlin states her physical therapist mention to her [facility], she wants to speak with Alexandria Mclaughlin. She will be scheduled for an appointment with Alexandria Mclaughlin she also was instructed to speak with Alexandria Alexandria Mclaughlin, she verbalizes understanding.    Last UDS was Performed on 10/18/2020, it was consistent.    Pain Inventory Average Pain 4 Pain Right Now 4 My pain is intermittent, sharp, and stabbing  In the last 24 hours, has pain interfered with the following? General activity 5 Relation with others 6 Enjoyment of life 7 What TIME of day is your pain at its worst? daytime Sleep (in general) Fair  Pain is worse with: walking, bending, sitting, and standing Pain improves with: rest, heat/ice, and medication Relief from Meds: 5  Family History  Problem Relation Age of Onset   Diabetes Mother    Heart disease Mother    Stroke Mother    Heart disease Father    Heart disease Brother    Social History   Socioeconomic History   Marital status: Widowed    Spouse name: Not on file   Number of children:  Not on file   Years of education: Not on file   Highest education level: Not on file  Occupational History   Not on file  Tobacco Use   Smoking status: Former    Packs/day: 0.50    Years: 5.00    Pack years: 2.50    Types: Cigarettes    Quit date: 03/15/1968    Years since quitting: 53.1   Smokeless tobacco: Never  Vaping Use   Vaping Use: Never used  Substance and Sexual Activity   Alcohol use: Not Currently    Alcohol/week: 1.0 standard drink    Types: 1 Glasses of wine per week    Comment: occasional glass of wine   Drug use: No   Sexual activity: Not on file  Other Topics Concern   Not on file  Social History Narrative   Not on file   Social Determinants of Health   Financial Resource Strain: Not on file  Food Insecurity: Not on file  Transportation Needs: Not on file  Physical Activity: Not on file  Stress: Not on file  Social Connections: Not on file   Past Surgical History:  Procedure Laterality Date   [place]     x2   RETINAL DETACHMENT SURGERY  9/13   TRANSFORAMINAL LUMBAR INTERBODY FUSION W/ MIS 1 LEVEL Right 04/26/2018  Procedure: Right Lumbar four-five Minimally invasive Transforaminal lumbar interbody fusion;  Surgeon: Judith Part, MD;  Location: [place];  Service: Neurosurgery;  Laterality: Right;   Past Surgical History:  Procedure Laterality Date   ABDOMINAL HYSTERECTOMY     BACK SURGERY     2011   [place]     x2   RETINAL DETACHMENT SURGERY  9/13   TRANSFORAMINAL LUMBAR INTERBODY FUSION W/ MIS 1 LEVEL Right 04/26/2018   Procedure: Right Lumbar four-five Minimally invasive Transforaminal lumbar interbody fusion;  Surgeon: Judith Part, MD;  Location: [place];  Service: Neurosurgery;  Laterality: Right;   Past Medical History:  Diagnosis Date   Cervical facet syndrome    Depression    Disorders of sacrum     Enthesopathy of hip region    GERD (gastroesophageal reflux disease)    Headache    History of kidney stones    Hypertension    Sciatic neuritis    Synovitis and tenosynovitis    Vision abnormalities    BP 137/73    Pulse 84    Ht 5\' 3"  (1.6 m)    Wt 144 lb 6.4 oz (65.5 kg)    SpO2 94%    BMI 25.58 kg/m   Opioid Risk Score:   Fall Risk Score:  `1  Depression screen PHQ 2/9  Depression screen [facility] 2/9 02/12/2021 12/13/2020 10/17/2020 07/24/2020 05/09/2020 09/15/2019 01/25/2018  Decreased Interest 0 1 0 0 0 0 0  Down, Depressed, Hopeless 0 1 0 0 0 0 0  PHQ - 2 Score 0 2 0 0 0 0 0  Altered sleeping - - - - - - -  Tired, decreased energy - - - - - - -  Change in appetite - - - - - - -  Feeling bad or failure about yourself  - - - - - - -  Trouble concentrating - - - - - - -  Moving slowly or fidgety/restless - - - - - - -  Suicidal thoughts - - - - - - -  PHQ-9 Score - - - - - - -  Some recent data might be hidden     Review of Systems  Musculoskeletal:  Positive for gait problem.       Right buttock pain, hip  All other systems reviewed and are negative.     Objective:   Physical Exam Vitals and nursing note reviewed.  Constitutional:      Appearance: Normal appearance.  Cardiovascular:     Rate and Rhythm: Normal rate and regular rhythm.     Pulses: Normal pulses.     Heart sounds: Normal heart sounds.  Pulmonary:     Effort: Pulmonary effort is normal.     Breath sounds: Normal breath sounds.  Musculoskeletal:     Cervical back: Normal range of motion and neck supple.     Comments: Normal Muscle Bulk and Muscle Testing Reveals:  Upper Extremities: Full ROM and Muscle Strength 5/5 Lumbar Paraspinal Tenderness: L-4-L-5 Lower Extremities: Full ROM and Muscle Strength on right 4/5 and left 5/5 Arises from Table slowly using walker for support Unsteady Gait     Skin:    General: Skin is warm and dry.  Neurological:     Mental Status: She is alert and oriented to  person, place, and time.  Psychiatric:        Mood and Affect: Mood normal.  Behavior: Behavior normal.         Assessment & Plan:  1. Facet Arthropathy/ Sacroiliac Joint Dysfunction: 04/17/2021. Refilled: Fentanyl 25 mcg patch  # 10- one patch every 3 days and   Percocet 10/325 mg one every 6 hours as needed  # 110,  Second script  given for the following month.  Continue to Monitor. . We will continue the opioid monitoring program, this consists of regular clinic visits, examinations, urine drug screen, pill counts as well as use of [place] Controlled Substance Reporting system. A 12 month History has been reviewed on the [place] on 04/17/2021.  2. Lumbar Radicular  Pain: Continue HEP as Tolerated. Continue to Monitor. Continue Gabapentin and Cymbalta. 04/17/2021 3. Gait Disorder: Continue HEP / Neurology Following. 04/17/2021 4. Myoclonus with sleep: Continue Klonopin. Continue current medication regimen. Continue to Monitor. 04/17/2021     F/U in 2 months

## 2021-04-18 DIAGNOSIS — R262 Difficulty in walking, not elsewhere classified: Secondary | ICD-10-CM | POA: Diagnosis not present

## 2021-04-18 DIAGNOSIS — R260 Ataxic gait: Secondary | ICD-10-CM | POA: Diagnosis not present

## 2021-04-18 DIAGNOSIS — M6281 Muscle weakness (generalized): Secondary | ICD-10-CM | POA: Diagnosis not present

## 2021-04-21 ENCOUNTER — Encounter: Payer: Self-pay | Admitting: Registered Nurse

## 2021-04-22 ENCOUNTER — Telehealth: Payer: Self-pay | Admitting: Registered Nurse

## 2021-04-22 NOTE — Telephone Encounter (Signed)
Good Morning [name],  Ms. Wymore would like to know if you could review her chart, and offer any advice to her. She has a scheduled appointment with you in April.  She was hospitalized in November 2022, she has a scheduled appointment with Dr Loretta Plume in March.  He is currently attending physical therapy twice a week, she stated her therapist mention [facility], I wasn't sure if I could place the referral for the clinic or does it have to come from Dr Loretta Plume.  I will await your response.  Zella Ball

## 2021-04-23 NOTE — Telephone Encounter (Signed)
Placed a call to Ms. Muhl,  No answer. Left message to return the call.

## 2021-04-25 DIAGNOSIS — R260 Ataxic gait: Secondary | ICD-10-CM | POA: Diagnosis not present

## 2021-04-25 DIAGNOSIS — M6281 Muscle weakness (generalized): Secondary | ICD-10-CM | POA: Diagnosis not present

## 2021-04-25 DIAGNOSIS — R262 Difficulty in walking, not elsewhere classified: Secondary | ICD-10-CM | POA: Diagnosis not present

## 2021-04-30 DIAGNOSIS — R262 Difficulty in walking, not elsewhere classified: Secondary | ICD-10-CM | POA: Diagnosis not present

## 2021-04-30 DIAGNOSIS — M6281 Muscle weakness (generalized): Secondary | ICD-10-CM | POA: Diagnosis not present

## 2021-04-30 DIAGNOSIS — R260 Ataxic gait: Secondary | ICD-10-CM | POA: Diagnosis not present

## 2021-05-02 DIAGNOSIS — R260 Ataxic gait: Secondary | ICD-10-CM | POA: Diagnosis not present

## 2021-05-02 DIAGNOSIS — R262 Difficulty in walking, not elsewhere classified: Secondary | ICD-10-CM | POA: Diagnosis not present

## 2021-05-02 DIAGNOSIS — M6281 Muscle weakness (generalized): Secondary | ICD-10-CM | POA: Diagnosis not present

## 2021-05-06 NOTE — Progress Notes (Unsigned)
NEUROLOGY FOLLOW UP OFFICE NOTE  Alexandria Mclaughlin [id number]  Assessment/Plan:   1  Ataxia - While she has multiple etiologies that may cause gait instability, such as lumbar spine disease, arthritis, polypharmacy, I don't think that would be the cause of her current balance problems.  This appears to be a subacute progression.  Given the myoclonus, consider a myelopathy.  Also consider an underlying neurodegenerative disease.  She may have underlying polyneuropathy, but I wouldn't expect it to progress so quickly unless it was demyelinating (which seems less likely given preserved reflexes) or uncontrolled diabetes (which is not the case as last Hgb A1c was 6).   She has low B12, which may be a contributing factor but probably not the primary etiology.  She endorsed right foot weakness while in the hospital.  Unclear etiology.  MRI negative for acute stroke and nothing surgical on lumbar MRI. 2  Transient acute encephalopathy - unclear etiology.  Possibly related to dehydration in conjunction with polypharmacy.   First will check MRI of cervical and thoracic spine If unremarkable, would evaluate for other causes (such as NCV-EMG of lower extremities) Follow up after testing.  Further recommendations pending results. Follow up with PCP regarding elevated blood pressure.     Subjective:  Alexandria Mclaughlin is an 81 year old female with HTN, HLD, s/p lumbar spine surgery, BPPV who follows up for ataxia.  UPDATE: MRI of cervical and thoracic spine on 02/27/2021 revealed no evidence of spinal stenosis or myelopathy.  Because she endorsed right leg twitching/shaking, she had an EEG on 04/10/2021 which was normal.     HISTORY: She was seen by neurology for worsening balance in 2017.  MRI of brain showed extensive chronic small vessel ischemic changes including the pons.  Balance problems was thought to be due to a tiny MRI-negative brainstem infarct as patient reported double vision at the time.  Balance  problems subsequently resolved and she now wears corrective lenses for her double vision.  She has longstanding chronic low back pain.  She has history of lumbar spine surgery with post-laminectomy syndrome, lumbar facet arthropathy and sacroiliac joint dysfunction.  She takes multiple medications including fentanyl, oxycodone-acetaminophen, gabapentin, duloxetine, trazodone, clonazepam (for myoclonus).  She also has longstanding history of dizziness/BPPV for which she takes meclizine as needed.  However, she began having gradually progressive balance problems beginning around August 2022.  No falls or other preceding event.  No particular weakness in legs.  She has residual right foot and lower leg numbness from back surgery in 2020 but otherwise no numbness.  Denies bowel or bladder incontinence. No dizziness.  She just feels off balance.  She started using a cane for a month but then had to start using a walker.  She is unable to stand or ambulate without assistance  She was admitted to the hospital on 12/27/2020 due to confusion and slurred speech. She also had new right foot weakness (couldn't move her foot) that gradually improved over a couple of days.  MRI of brain personally reviewed again showed chronic small vessel ischemic changes but no acute abnormality.  CTA head and neck personally reviewed showed no LVO or hemodynamically significant stenosis.  No evidence of infection.  B12 was 239.  She had an MRI of lumbar spine which revealed lumbar spondylosis and degenerative disc disease causing moderate to prominent central narrowing of the thecal sac at L4-5 and moderate impingement at L3-4.  Neurosurgical intervention was felt not to be warranted.  No  clear etiology for the transient encephalopathy but no recurrence.  Since the hospitalization, her right leg will shake or jerk.  PAST MEDICAL HISTORY: Past Medical History:  Diagnosis Date   Cervical facet syndrome    Depression    Disorders of sacrum     Enthesopathy of hip region    GERD (gastroesophageal reflux disease)    Headache    History of kidney stones    Hypertension    Sciatic neuritis    Synovitis and tenosynovitis    Vision abnormalities     MEDICATIONS: Current Outpatient Medications on File Prior to Visit  Medication Sig Dispense Refill   aspirin-acetaminophen-caffeine (EXCEDRIN MIGRAINE) 250-250-65 MG tablet Take 1 tablet by mouth every 6 (six) hours as needed for headache or migraine.     Calcium Polycarbophil (FIBER-CAPS PO)      clonazePAM (KLONOPIN) 0.5 MG tablet Take 1 tablet (0.5 mg total) by mouth at bedtime as needed for anxiety (sleep). 30 tablet 2   DULoxetine (CYMBALTA) 30 MG capsule TAKE 1 CAPSULE BY MOUTH EVERY DAY 90 capsule 2   fentaNYL (DURAGESIC) 25 MCG/HR Place 1 patch onto the skin every 3 (three) days. Do Not Fill Before 04/27/2021 10 patch 0   gabapentin (NEURONTIN) 600 MG tablet TAKE 1 TABLET 4 TIMES A DAY (Patient taking differently: Take 600 mg by mouth in the morning, at noon, in the evening, and at bedtime.) 360 tablet 1   irbesartan (AVAPRO) 150 MG tablet Take 150 mg by mouth daily.     Melatonin 5 MG CAPS Take 5 mg by mouth at bedtime as needed (sleep).     methimazole (TAPAZOLE) 5 MG tablet Take 5 mg by mouth 3 (three) times daily.     oxyCODONE-acetaminophen (PERCOCET) 10-325 MG tablet Take 1 tablet by mouth every 6 (six) hours as needed for pain. Do Not Fill Before 05/12/2021 110 tablet 0   rosuvastatin (CRESTOR) 20 MG tablet Take 20 mg by mouth at bedtime.     traZODone (DESYREL) 100 MG tablet TAKE 1 TABLET BY MOUTH EVERYDAY AT BEDTIME 90 tablet 2   No current facility-administered medications on file prior to visit.    ALLERGIES: No Known Allergies  FAMILY HISTORY: Family History  Problem Relation Age of Onset   Diabetes Mother    Heart disease Mother    Stroke Mother    Heart disease Father    Heart disease Brother       Objective:  *** General: No acute distress.   Patient appears ***-groomed.   Head:  Normocephalic/atraumatic Eyes:  Fundi examined but not visualized Neck: supple, no paraspinal tenderness, full range of motion Heart:  Regular rate and rhythm Lungs:  Clear to auscultation bilaterally Back: No paraspinal tenderness Neurological Exam: alert and oriented to person, place, and time.  Speech fluent and not dysarthric, language intact.  CN II-XII intact. Bulk and tone normal, muscle strength 5/5 throughout.  Sensation to light touch intact.  Deep tendon reflexes 2+ throughout, toes downgoing.  Finger to nose testing intact.  Gait normal, Romberg negative.   Metta Clines, DO  CC: ***

## 2021-05-07 ENCOUNTER — Other Ambulatory Visit: Payer: Self-pay

## 2021-05-07 ENCOUNTER — Encounter: Payer: Self-pay | Admitting: Neurology

## 2021-05-07 ENCOUNTER — Ambulatory Visit: Payer: Medicare HMO | Admitting: Neurology

## 2021-05-07 ENCOUNTER — Other Ambulatory Visit (INDEPENDENT_AMBULATORY_CARE_PROVIDER_SITE_OTHER): Payer: Medicare HMO

## 2021-05-07 VITALS — BP 128/79 | HR 59 | Resp 18 | Ht 63.0 in | Wt 147.0 lb

## 2021-05-07 DIAGNOSIS — R2681 Unsteadiness on feet: Secondary | ICD-10-CM | POA: Diagnosis not present

## 2021-05-07 DIAGNOSIS — E039 Hypothyroidism, unspecified: Secondary | ICD-10-CM | POA: Diagnosis not present

## 2021-05-07 DIAGNOSIS — I69393 Ataxia following cerebral infarction: Secondary | ICD-10-CM | POA: Diagnosis not present

## 2021-05-07 DIAGNOSIS — I700 Atherosclerosis of aorta: Secondary | ICD-10-CM | POA: Diagnosis not present

## 2021-05-07 DIAGNOSIS — F1190 Opioid use, unspecified, uncomplicated: Secondary | ICD-10-CM | POA: Diagnosis not present

## 2021-05-07 DIAGNOSIS — I10 Essential (primary) hypertension: Secondary | ICD-10-CM | POA: Diagnosis not present

## 2021-05-07 DIAGNOSIS — F321 Major depressive disorder, single episode, moderate: Secondary | ICD-10-CM | POA: Diagnosis not present

## 2021-05-07 DIAGNOSIS — E041 Nontoxic single thyroid nodule: Secondary | ICD-10-CM | POA: Diagnosis not present

## 2021-05-07 DIAGNOSIS — E0590 Thyrotoxicosis, unspecified without thyrotoxic crisis or storm: Secondary | ICD-10-CM | POA: Diagnosis not present

## 2021-05-07 LAB — VITAMIN B12: Vitamin B-12: 257 pg/mL (ref 211–911)

## 2021-05-07 NOTE — Patient Instructions (Signed)
I think that the balance problems are related to a prior MRI-negative stroke which symptoms got worse after being in hospital.  We will check another B12 level.  Sometimes low B12 may cause balance problems. ?

## 2021-05-09 DIAGNOSIS — R260 Ataxic gait: Secondary | ICD-10-CM | POA: Diagnosis not present

## 2021-05-09 DIAGNOSIS — R262 Difficulty in walking, not elsewhere classified: Secondary | ICD-10-CM | POA: Diagnosis not present

## 2021-05-09 DIAGNOSIS — M6281 Muscle weakness (generalized): Secondary | ICD-10-CM | POA: Diagnosis not present

## 2021-05-14 DIAGNOSIS — M6281 Muscle weakness (generalized): Secondary | ICD-10-CM | POA: Diagnosis not present

## 2021-05-14 DIAGNOSIS — R260 Ataxic gait: Secondary | ICD-10-CM | POA: Diagnosis not present

## 2021-05-14 DIAGNOSIS — R262 Difficulty in walking, not elsewhere classified: Secondary | ICD-10-CM | POA: Diagnosis not present

## 2021-05-16 DIAGNOSIS — R260 Ataxic gait: Secondary | ICD-10-CM | POA: Diagnosis not present

## 2021-05-16 DIAGNOSIS — M6281 Muscle weakness (generalized): Secondary | ICD-10-CM | POA: Diagnosis not present

## 2021-05-16 DIAGNOSIS — R262 Difficulty in walking, not elsewhere classified: Secondary | ICD-10-CM | POA: Diagnosis not present

## 2021-05-21 DIAGNOSIS — H35373 Puckering of macula, bilateral: Secondary | ICD-10-CM | POA: Diagnosis not present

## 2021-05-21 DIAGNOSIS — R262 Difficulty in walking, not elsewhere classified: Secondary | ICD-10-CM | POA: Diagnosis not present

## 2021-05-21 DIAGNOSIS — M6281 Muscle weakness (generalized): Secondary | ICD-10-CM | POA: Diagnosis not present

## 2021-05-21 DIAGNOSIS — H43813 Vitreous degeneration, bilateral: Secondary | ICD-10-CM | POA: Diagnosis not present

## 2021-05-21 DIAGNOSIS — H353131 Nonexudative age-related macular degeneration, bilateral, early dry stage: Secondary | ICD-10-CM | POA: Diagnosis not present

## 2021-05-21 DIAGNOSIS — H40023 Open angle with borderline findings, high risk, bilateral: Secondary | ICD-10-CM | POA: Diagnosis not present

## 2021-05-21 DIAGNOSIS — R260 Ataxic gait: Secondary | ICD-10-CM | POA: Diagnosis not present

## 2021-05-23 DIAGNOSIS — R260 Ataxic gait: Secondary | ICD-10-CM | POA: Diagnosis not present

## 2021-05-23 DIAGNOSIS — M6281 Muscle weakness (generalized): Secondary | ICD-10-CM | POA: Diagnosis not present

## 2021-05-23 DIAGNOSIS — R262 Difficulty in walking, not elsewhere classified: Secondary | ICD-10-CM | POA: Diagnosis not present

## 2021-05-24 DIAGNOSIS — M810 Age-related osteoporosis without current pathological fracture: Secondary | ICD-10-CM | POA: Diagnosis not present

## 2021-05-24 DIAGNOSIS — E782 Mixed hyperlipidemia: Secondary | ICD-10-CM | POA: Diagnosis not present

## 2021-05-24 DIAGNOSIS — I10 Essential (primary) hypertension: Secondary | ICD-10-CM | POA: Diagnosis not present

## 2021-06-03 ENCOUNTER — Telehealth: Payer: Self-pay | Admitting: Registered Nurse

## 2021-06-03 DIAGNOSIS — M7522 Bicipital tendinitis, left shoulder: Secondary | ICD-10-CM

## 2021-06-03 MED ORDER — OXYCODONE-ACETAMINOPHEN 10-325 MG PO TABS: 1.0000 | ORAL_TABLET | Freq: Four times a day (QID) | ORAL | 0 refills | Status: DC | PRN

## 2021-06-03 MED ORDER — FENTANYL 25 MCG/HR TD PT72: 1.0000 | MEDICATED_PATCH | TRANSDERMAL | 0 refills | Status: DC

## 2021-06-03 NOTE — Telephone Encounter (Signed)
PMP was Reviewed.  ?Medication sent to pharmacy.  ?Placed a call to Alexandria Mclaughlin regarding the above, she verbalizes understanding.  ?

## 2021-06-04 DIAGNOSIS — R262 Difficulty in walking, not elsewhere classified: Secondary | ICD-10-CM | POA: Diagnosis not present

## 2021-06-04 DIAGNOSIS — M6281 Muscle weakness (generalized): Secondary | ICD-10-CM | POA: Diagnosis not present

## 2021-06-04 DIAGNOSIS — R260 Ataxic gait: Secondary | ICD-10-CM | POA: Diagnosis not present

## 2021-06-05 ENCOUNTER — Ambulatory Visit: Payer: Medicare Other | Admitting: Physical Medicine & Rehabilitation

## 2021-06-06 DIAGNOSIS — E0590 Thyrotoxicosis, unspecified without thyrotoxic crisis or storm: Secondary | ICD-10-CM | POA: Diagnosis not present

## 2021-06-06 DIAGNOSIS — R7303 Prediabetes: Secondary | ICD-10-CM | POA: Diagnosis not present

## 2021-06-06 DIAGNOSIS — I10 Essential (primary) hypertension: Secondary | ICD-10-CM | POA: Diagnosis not present

## 2021-06-06 DIAGNOSIS — R260 Ataxic gait: Secondary | ICD-10-CM | POA: Diagnosis not present

## 2021-06-06 DIAGNOSIS — R262 Difficulty in walking, not elsewhere classified: Secondary | ICD-10-CM | POA: Diagnosis not present

## 2021-06-06 DIAGNOSIS — M6281 Muscle weakness (generalized): Secondary | ICD-10-CM | POA: Diagnosis not present

## 2021-06-06 DIAGNOSIS — E782 Mixed hyperlipidemia: Secondary | ICD-10-CM | POA: Diagnosis not present

## 2021-06-12 DIAGNOSIS — E782 Mixed hyperlipidemia: Secondary | ICD-10-CM | POA: Diagnosis not present

## 2021-06-12 DIAGNOSIS — I700 Atherosclerosis of aorta: Secondary | ICD-10-CM | POA: Diagnosis not present

## 2021-06-12 DIAGNOSIS — R7303 Prediabetes: Secondary | ICD-10-CM | POA: Diagnosis not present

## 2021-06-12 DIAGNOSIS — I10 Essential (primary) hypertension: Secondary | ICD-10-CM | POA: Diagnosis not present

## 2021-06-12 DIAGNOSIS — F1190 Opioid use, unspecified, uncomplicated: Secondary | ICD-10-CM | POA: Diagnosis not present

## 2021-06-12 DIAGNOSIS — G894 Chronic pain syndrome: Secondary | ICD-10-CM | POA: Diagnosis not present

## 2021-06-12 DIAGNOSIS — E0590 Thyrotoxicosis, unspecified without thyrotoxic crisis or storm: Secondary | ICD-10-CM | POA: Diagnosis not present

## 2021-06-19 ENCOUNTER — Encounter: Payer: Medicare HMO | Attending: Physical Medicine and Rehabilitation | Admitting: Physical Medicine & Rehabilitation

## 2021-06-19 ENCOUNTER — Encounter: Payer: Self-pay | Admitting: Physical Medicine & Rehabilitation

## 2021-06-19 VITALS — BP 112/75 | HR 67 | Ht 63.0 in | Wt 145.4 lb

## 2021-06-19 DIAGNOSIS — Z5181 Encounter for therapeutic drug level monitoring: Secondary | ICD-10-CM | POA: Diagnosis not present

## 2021-06-19 DIAGNOSIS — M7522 Bicipital tendinitis, left shoulder: Secondary | ICD-10-CM | POA: Diagnosis not present

## 2021-06-19 DIAGNOSIS — G894 Chronic pain syndrome: Secondary | ICD-10-CM | POA: Insufficient documentation

## 2021-06-19 DIAGNOSIS — R269 Unspecified abnormalities of gait and mobility: Secondary | ICD-10-CM | POA: Insufficient documentation

## 2021-06-19 DIAGNOSIS — Z79891 Long term (current) use of opiate analgesic: Secondary | ICD-10-CM | POA: Insufficient documentation

## 2021-06-19 MED ORDER — FENTANYL 25 MCG/HR TD PT72: 1.0000 | MEDICATED_PATCH | TRANSDERMAL | 0 refills | Status: DC

## 2021-06-19 MED ORDER — OXYCODONE-ACETAMINOPHEN 10-325 MG PO TABS: 1.0000 | ORAL_TABLET | Freq: Four times a day (QID) | ORAL | 0 refills | Status: DC | PRN

## 2021-06-19 NOTE — Progress Notes (Signed)
? ?Subjective:  ? ? Patient ID: Alexandria Mclaughlin, female    DOB: 1940-05-30, 81 y.o.   MRN: [id number] ? ?HPI ? ?Alexandria Mclaughlin is here in follow-up of her chronic pain as well as her gait disorder.  She is at worsening truncal ataxia since I last saw her.  She has been followed by neurology and was hospitalized briefly as well.  She had imaging done of her head and neck and really no significant abnormalities were seen.  EMG and nerve conduction studies were essentially negative as well.  She does deal with chronic low back pain and right leg pain and weakness related to her lumbar radiculopathy.  She is now attending physical therapy for her balance.  She is using a rolling walker for support and does not go anywhere without it now.  She does feel that her balance has improved somewhat with her therapies.  She is unable to go up and down stairs still and that she has 2 person assist.  She remains on fentanyl and oxycodone for pain control which has been on for some time. ? ?Pain Inventory ?Average Pain 4 ?Pain Right Now 3 ?My pain is intermittent, sharp, and stabbing ? ?In the last 24 hours, has pain interfered with the following? ?General activity 3 ?Relation with others 3 ?Enjoyment of life 3 ?What TIME of day is your pain at its worst? daytime ?Sleep (in general) Fair ? ?Pain is worse with: walking, bending, and sitting ?Pain improves with: rest, heat/ice, and medication ?Relief from Meds: 6 ? ?Family History  ?Problem Relation Age of Onset  ? Diabetes Mother   ? Heart disease Mother   ? Stroke Mother   ? Heart disease Father   ? Heart disease Brother   ? ?Social History  ? ?Socioeconomic History  ? Marital status: Widowed  ?  Spouse name: Not on file  ? Number of children: Not on file  ? Years of education: Not on file  ? Highest education level: Not on file  ?Occupational History  ? Not on file  ?Tobacco Use  ? Smoking status: Former  ?  Packs/day: 0.50  ?  Years: 5.00  ?  Pack years: 2.50  ?  Types: Cigarettes  ?  Quit  date: 03/15/1968  ?  Years since quitting: 53.2  ? Smokeless tobacco: Never  ?Vaping Use  ? Vaping Use: Never used  ?Substance and Sexual Activity  ? Alcohol use: Not Currently  ?  Alcohol/week: 1.0 standard drink  ?  Types: 1 Glasses of wine per week  ?  Comment: occasional glass of wine  ? Drug use: No  ? Sexual activity: Not on file  ?Other Topics Concern  ? Not on file  ?Social History Narrative  ? Right handed  ? Drinks caffeine  ? Condo two story with elevator  ? ?Social Determinants of Health  ? ?Financial Resource Strain: Not on file  ?Food Insecurity: Not on file  ?Transportation Needs: Not on file  ?Physical Activity: Not on file  ?Stress: Not on file  ?Social Connections: Not on file  ? ?Past Surgical History:  ?Procedure Laterality Date  ? ABDOMINAL HYSTERECTOMY    ? BACK SURGERY    ? 2011  ? BLADDER SURGERY  1985  ? CESAREAN SECTION    ? x2  ? RETINAL DETACHMENT SURGERY  9/13  ? TRANSFORAMINAL LUMBAR INTERBODY FUSION W/ MIS 1 LEVEL Right 04/26/2018  ? Procedure: Right Lumbar four-five Minimally invasive Transforaminal lumbar interbody fusion;  Surgeon: Judith Part, MD;  Location: [place];  Service: Neurosurgery;  Laterality: Right;  ? ?Past Surgical History:  ?Procedure Laterality Date  ? ABDOMINAL HYSTERECTOMY    ? BACK SURGERY    ? 2011  ? BLADDER SURGERY  1985  ? CESAREAN SECTION    ? x2  ? RETINAL DETACHMENT SURGERY  9/13  ? TRANSFORAMINAL LUMBAR INTERBODY FUSION W/ MIS 1 LEVEL Right 04/26/2018  ? Procedure: Right Lumbar four-five Minimally invasive Transforaminal lumbar interbody fusion;  Surgeon: Judith Part, MD;  Location: [place];  Service: Neurosurgery;  Laterality: Right;  ? ?Past Medical History:  ?Diagnosis Date  ? Cervical facet syndrome   ? Depression   ? Disorders of sacrum   ? Enthesopathy of hip region   ? GERD (gastroesophageal reflux disease)   ? Headache   ? History of kidney stones   ? Hypertension   ? Sciatic neuritis   ? Synovitis and tenosynovitis   ? Vision abnormalities    ? ?BP 112/75   Pulse 67   Ht '5\' 3"'$  (1.6 m)   Wt 145 lb 6.4 oz (66 kg)   SpO2 90%   BMI 25.76 kg/m?  ? ?Opioid Risk Score:   ?Fall Risk Score:  `1 ? ?Depression screen PHQ 2/9 ? ? ?  06/19/2021  ?  2:53 PM 02/12/2021  ?  2:41 PM 12/13/2020  ?  1:55 PM 10/17/2020  ?  1:34 PM 07/24/2020  ?  2:48 PM 05/09/2020  ?  3:42 PM 09/15/2019  ?  1:31 PM  ?Depression screen PHQ 2/9  ?Decreased Interest 1 0 1 0 0 0 0  ?Down, Depressed, Hopeless 1 0 1 0 0 0 0  ?PHQ - 2 Score 2 0 2 0 0 0 0  ?  ? ?Review of Systems  ?Constitutional: Negative.   ?HENT: Negative.    ?Eyes: Negative.   ?Respiratory: Negative.    ?Cardiovascular: Negative.   ?Gastrointestinal: Negative.   ?Endocrine: Negative.   ?Genitourinary: Negative.   ?Musculoskeletal:  Positive for gait problem.  ?Skin: Negative.   ?Allergic/Immunologic: Negative.   ?Hematological: Negative.   ?Psychiatric/Behavioral: Negative.    ? ?   ?Objective:  ? Physical Exam ? ?General: No acute distress ?HEENT: NCAT, EOMI, oral membranes moist ?Cards: reg rate  ?Chest: normal effort ?Abdomen: Soft, NT, ND ?Skin: dry, intact ?Extremities: no edema ?Psych: pleasant and appropriate  ? Neurological: Patient is alert and oriented x3 with normal insight and awareness.  Speech is clear.  No language issues.  no nystagmus.  she demonstrates significant truncal ataxia in stance. Fell to left/forward.  No limb ataxia. Speech is clear. Normal language. Strength 5/5. Sensation nl except for RLE.  ?  ?  ?   ?1.  Chronic low back pain with lumbar postlaminectomy syndrome.  Recent fall with new L1 compression fracture. ?-previous L4-5 fusion/decompression/scar tissue removal by NS ?-refilled fentanyl patch 37mg  every 72 hours, #10.    ?-refilled Percocet 10/325 #75.   ?We will continue the controlled substance monitoring program, this consists of regular clinic visits, examinations, routine drug screening, [place].   ?-UDS today ?2. Neuropathic Pain/right sided lumbar radicular pain, L4---improved after surgery ?             -she has had ongoing weakness in the right foot. ?3. Gait Disorder with truncal ataxia . MRI without lesion but stroke could  be between slices in posterior circulation. Demonstrates significant truncal ataxia on exam ?  ?-continue with walker at ALL times.  ?-outpt therapies. She is seeing some early improvement ?-?second opinion.  ?4. Myoclonus with sleep: Continue Klonopin ?5. Lumbar facet arthropathy: had previous results with MBB's.   ?6.  Osteoarthritis of hands.   Voltaren gel 3 times daily.   ?7. Right shoulder and left shoulder pain, left biceps tendonitis ?          -pain reduced after medrol dose pack ?     -continue HEP ?     -may need further intervention in future.  ?  ?Fifteen minutes of face to face patient care time were spent during this visit. All questions were encouraged and answered.  Follow up with NP in 2 mos .  ? ? ? ?

## 2021-06-19 NOTE — Patient Instructions (Signed)
PLEASE FEEL FREE TO CALL OUR OFFICE WITH ANY PROBLEMS OR QUESTIONS ([phone])      

## 2021-06-20 DIAGNOSIS — R260 Ataxic gait: Secondary | ICD-10-CM | POA: Diagnosis not present

## 2021-06-20 DIAGNOSIS — M6281 Muscle weakness (generalized): Secondary | ICD-10-CM | POA: Diagnosis not present

## 2021-06-20 DIAGNOSIS — R262 Difficulty in walking, not elsewhere classified: Secondary | ICD-10-CM | POA: Diagnosis not present

## 2021-06-25 DIAGNOSIS — R262 Difficulty in walking, not elsewhere classified: Secondary | ICD-10-CM | POA: Diagnosis not present

## 2021-06-25 DIAGNOSIS — M6281 Muscle weakness (generalized): Secondary | ICD-10-CM | POA: Diagnosis not present

## 2021-06-25 DIAGNOSIS — R260 Ataxic gait: Secondary | ICD-10-CM | POA: Diagnosis not present

## 2021-06-27 DIAGNOSIS — R260 Ataxic gait: Secondary | ICD-10-CM | POA: Diagnosis not present

## 2021-06-27 DIAGNOSIS — R262 Difficulty in walking, not elsewhere classified: Secondary | ICD-10-CM | POA: Diagnosis not present

## 2021-06-27 DIAGNOSIS — M6281 Muscle weakness (generalized): Secondary | ICD-10-CM | POA: Diagnosis not present

## 2021-06-30 LAB — TOXASSURE SELECT,+ANTIDEPR,UR

## 2021-07-02 DIAGNOSIS — R262 Difficulty in walking, not elsewhere classified: Secondary | ICD-10-CM | POA: Diagnosis not present

## 2021-07-02 DIAGNOSIS — R260 Ataxic gait: Secondary | ICD-10-CM | POA: Diagnosis not present

## 2021-07-02 DIAGNOSIS — M6281 Muscle weakness (generalized): Secondary | ICD-10-CM | POA: Diagnosis not present

## 2021-07-02 DIAGNOSIS — Z0100 Encounter for examination of eyes and vision without abnormal findings: Secondary | ICD-10-CM | POA: Diagnosis not present

## 2021-07-04 ENCOUNTER — Telehealth: Payer: Self-pay | Admitting: *Deleted

## 2021-07-04 DIAGNOSIS — R262 Difficulty in walking, not elsewhere classified: Secondary | ICD-10-CM | POA: Diagnosis not present

## 2021-07-04 DIAGNOSIS — M6281 Muscle weakness (generalized): Secondary | ICD-10-CM | POA: Diagnosis not present

## 2021-07-04 DIAGNOSIS — R260 Ataxic gait: Secondary | ICD-10-CM | POA: Diagnosis not present

## 2021-07-04 NOTE — Telephone Encounter (Signed)
Urine drug screen for this encounter is consistent for prescribed medication 

## 2021-07-09 DIAGNOSIS — R262 Difficulty in walking, not elsewhere classified: Secondary | ICD-10-CM | POA: Diagnosis not present

## 2021-07-09 DIAGNOSIS — R260 Ataxic gait: Secondary | ICD-10-CM | POA: Diagnosis not present

## 2021-07-09 DIAGNOSIS — M6281 Muscle weakness (generalized): Secondary | ICD-10-CM | POA: Diagnosis not present

## 2021-07-16 DIAGNOSIS — E0590 Thyrotoxicosis, unspecified without thyrotoxic crisis or storm: Secondary | ICD-10-CM | POA: Diagnosis not present

## 2021-07-16 DIAGNOSIS — E041 Nontoxic single thyroid nodule: Secondary | ICD-10-CM | POA: Diagnosis not present

## 2021-07-16 DIAGNOSIS — R7303 Prediabetes: Secondary | ICD-10-CM | POA: Diagnosis not present

## 2021-07-16 DIAGNOSIS — M6281 Muscle weakness (generalized): Secondary | ICD-10-CM | POA: Diagnosis not present

## 2021-07-16 DIAGNOSIS — F1190 Opioid use, unspecified, uncomplicated: Secondary | ICD-10-CM | POA: Diagnosis not present

## 2021-07-16 DIAGNOSIS — R2681 Unsteadiness on feet: Secondary | ICD-10-CM | POA: Diagnosis not present

## 2021-07-16 DIAGNOSIS — I700 Atherosclerosis of aorta: Secondary | ICD-10-CM | POA: Diagnosis not present

## 2021-07-16 DIAGNOSIS — E0520 Thyrotoxicosis with toxic multinodular goiter without thyrotoxic crisis or storm: Secondary | ICD-10-CM | POA: Diagnosis not present

## 2021-07-16 DIAGNOSIS — R262 Difficulty in walking, not elsewhere classified: Secondary | ICD-10-CM | POA: Diagnosis not present

## 2021-07-16 DIAGNOSIS — I10 Essential (primary) hypertension: Secondary | ICD-10-CM | POA: Diagnosis not present

## 2021-07-16 DIAGNOSIS — R260 Ataxic gait: Secondary | ICD-10-CM | POA: Diagnosis not present

## 2021-07-18 ENCOUNTER — Other Ambulatory Visit: Payer: Self-pay | Admitting: Internal Medicine

## 2021-07-18 DIAGNOSIS — E0520 Thyrotoxicosis with toxic multinodular goiter without thyrotoxic crisis or storm: Secondary | ICD-10-CM

## 2021-07-18 DIAGNOSIS — M6281 Muscle weakness (generalized): Secondary | ICD-10-CM | POA: Diagnosis not present

## 2021-07-18 DIAGNOSIS — E041 Nontoxic single thyroid nodule: Secondary | ICD-10-CM

## 2021-07-18 DIAGNOSIS — R260 Ataxic gait: Secondary | ICD-10-CM | POA: Diagnosis not present

## 2021-07-18 DIAGNOSIS — R262 Difficulty in walking, not elsewhere classified: Secondary | ICD-10-CM | POA: Diagnosis not present

## 2021-07-19 ENCOUNTER — Inpatient Hospital Stay: Admission: RE | Admit: 2021-07-19 | Payer: Medicare HMO | Source: Ambulatory Visit

## 2021-07-19 ENCOUNTER — Other Ambulatory Visit: Payer: Self-pay | Admitting: Registered Nurse

## 2021-07-19 DIAGNOSIS — M961 Postlaminectomy syndrome, not elsewhere classified: Secondary | ICD-10-CM

## 2021-07-19 DIAGNOSIS — M5416 Radiculopathy, lumbar region: Secondary | ICD-10-CM

## 2021-07-23 ENCOUNTER — Ambulatory Visit
Admission: RE | Admit: 2021-07-23 | Discharge: 2021-07-23 | Disposition: A | Discharge status: Home / Self Care | Payer: Medicare HMO | Source: Ambulatory Visit | Attending: Internal Medicine | Admitting: Internal Medicine

## 2021-07-23 DIAGNOSIS — E0520 Thyrotoxicosis with toxic multinodular goiter without thyrotoxic crisis or storm: Secondary | ICD-10-CM

## 2021-07-23 DIAGNOSIS — M6281 Muscle weakness (generalized): Secondary | ICD-10-CM | POA: Diagnosis not present

## 2021-07-23 DIAGNOSIS — R262 Difficulty in walking, not elsewhere classified: Secondary | ICD-10-CM | POA: Diagnosis not present

## 2021-07-23 DIAGNOSIS — E041 Nontoxic single thyroid nodule: Secondary | ICD-10-CM

## 2021-07-23 DIAGNOSIS — R260 Ataxic gait: Secondary | ICD-10-CM | POA: Diagnosis not present

## 2021-07-24 DIAGNOSIS — M810 Age-related osteoporosis without current pathological fracture: Secondary | ICD-10-CM | POA: Diagnosis not present

## 2021-07-24 DIAGNOSIS — I10 Essential (primary) hypertension: Secondary | ICD-10-CM | POA: Diagnosis not present

## 2021-07-24 DIAGNOSIS — E782 Mixed hyperlipidemia: Secondary | ICD-10-CM | POA: Diagnosis not present

## 2021-07-25 DIAGNOSIS — M6281 Muscle weakness (generalized): Secondary | ICD-10-CM | POA: Diagnosis not present

## 2021-07-25 DIAGNOSIS — R262 Difficulty in walking, not elsewhere classified: Secondary | ICD-10-CM | POA: Diagnosis not present

## 2021-07-25 DIAGNOSIS — R260 Ataxic gait: Secondary | ICD-10-CM | POA: Diagnosis not present

## 2021-08-01 ENCOUNTER — Other Ambulatory Visit: Payer: Self-pay | Admitting: Physical Medicine & Rehabilitation

## 2021-08-01 DIAGNOSIS — R262 Difficulty in walking, not elsewhere classified: Secondary | ICD-10-CM | POA: Diagnosis not present

## 2021-08-01 DIAGNOSIS — M6281 Muscle weakness (generalized): Secondary | ICD-10-CM | POA: Diagnosis not present

## 2021-08-01 DIAGNOSIS — R260 Ataxic gait: Secondary | ICD-10-CM | POA: Diagnosis not present

## 2021-08-01 MED ORDER — FENTANYL 25 MCG/HR TD PT72: 1.0000 | MEDICATED_PATCH | TRANSDERMAL | 0 refills | Status: DC

## 2021-08-12 ENCOUNTER — Other Ambulatory Visit: Payer: Self-pay | Admitting: Physical Medicine & Rehabilitation

## 2021-08-12 DIAGNOSIS — M7522 Bicipital tendinitis, left shoulder: Secondary | ICD-10-CM

## 2021-08-13 ENCOUNTER — Telehealth: Payer: Self-pay | Admitting: *Deleted

## 2021-08-13 ENCOUNTER — Other Ambulatory Visit (HOSPITAL_COMMUNITY): Payer: Self-pay

## 2021-08-13 DIAGNOSIS — M7522 Bicipital tendinitis, left shoulder: Secondary | ICD-10-CM

## 2021-08-13 DIAGNOSIS — M6281 Muscle weakness (generalized): Secondary | ICD-10-CM | POA: Diagnosis not present

## 2021-08-13 DIAGNOSIS — R262 Difficulty in walking, not elsewhere classified: Secondary | ICD-10-CM | POA: Diagnosis not present

## 2021-08-13 DIAGNOSIS — R260 Ataxic gait: Secondary | ICD-10-CM | POA: Diagnosis not present

## 2021-08-13 MED ORDER — OXYCODONE-ACETAMINOPHEN 10-325 MG PO TABS
1.0000 | ORAL_TABLET | Freq: Four times a day (QID) | ORAL | 0 refills | Status: DC | PRN
  Filled 2021-08-13: qty 110, 28d supply, fill #0

## 2021-08-13 NOTE — Telephone Encounter (Signed)
Mrs Tweten is needing a refill on her oxycodone 10/325. Her regular pharmacies are out. Please send to [facility] where they have them.  The rx was last filled 07/11/21 per PMP. (There is a request in your inbox for this)

## 2021-08-15 ENCOUNTER — Encounter: Payer: Medicare HMO | Attending: Physical Medicine and Rehabilitation | Admitting: Registered Nurse

## 2021-08-15 DIAGNOSIS — Z5181 Encounter for therapeutic drug level monitoring: Secondary | ICD-10-CM | POA: Insufficient documentation

## 2021-08-15 DIAGNOSIS — R260 Ataxic gait: Secondary | ICD-10-CM | POA: Diagnosis not present

## 2021-08-15 DIAGNOSIS — Z79891 Long term (current) use of opiate analgesic: Secondary | ICD-10-CM | POA: Insufficient documentation

## 2021-08-15 DIAGNOSIS — G894 Chronic pain syndrome: Secondary | ICD-10-CM | POA: Insufficient documentation

## 2021-08-15 DIAGNOSIS — R269 Unspecified abnormalities of gait and mobility: Secondary | ICD-10-CM | POA: Insufficient documentation

## 2021-08-15 DIAGNOSIS — M6281 Muscle weakness (generalized): Secondary | ICD-10-CM | POA: Diagnosis not present

## 2021-08-15 DIAGNOSIS — R262 Difficulty in walking, not elsewhere classified: Secondary | ICD-10-CM | POA: Diagnosis not present

## 2021-08-15 DIAGNOSIS — M7522 Bicipital tendinitis, left shoulder: Secondary | ICD-10-CM | POA: Insufficient documentation

## 2021-08-19 ENCOUNTER — Other Ambulatory Visit: Payer: Self-pay | Admitting: Registered Nurse

## 2021-08-19 DIAGNOSIS — G253 Myoclonus: Secondary | ICD-10-CM

## 2021-08-20 DIAGNOSIS — R260 Ataxic gait: Secondary | ICD-10-CM | POA: Diagnosis not present

## 2021-08-20 DIAGNOSIS — M6281 Muscle weakness (generalized): Secondary | ICD-10-CM | POA: Diagnosis not present

## 2021-08-20 DIAGNOSIS — R262 Difficulty in walking, not elsewhere classified: Secondary | ICD-10-CM | POA: Diagnosis not present

## 2021-08-23 DIAGNOSIS — M810 Age-related osteoporosis without current pathological fracture: Secondary | ICD-10-CM | POA: Diagnosis not present

## 2021-08-23 DIAGNOSIS — I10 Essential (primary) hypertension: Secondary | ICD-10-CM | POA: Diagnosis not present

## 2021-08-23 DIAGNOSIS — E782 Mixed hyperlipidemia: Secondary | ICD-10-CM | POA: Diagnosis not present

## 2021-08-28 ENCOUNTER — Encounter: Payer: Self-pay | Admitting: Registered Nurse

## 2021-08-28 ENCOUNTER — Encounter: Payer: Medicare HMO | Attending: Physical Medicine and Rehabilitation | Admitting: Registered Nurse

## 2021-08-28 VITALS — BP 139/84 | HR 92 | Ht 63.0 in | Wt 145.0 lb

## 2021-08-28 DIAGNOSIS — R413 Other amnesia: Secondary | ICD-10-CM

## 2021-08-28 DIAGNOSIS — M5416 Radiculopathy, lumbar region: Secondary | ICD-10-CM | POA: Diagnosis not present

## 2021-08-28 DIAGNOSIS — Z79891 Long term (current) use of opiate analgesic: Secondary | ICD-10-CM | POA: Diagnosis not present

## 2021-08-28 DIAGNOSIS — G253 Myoclonus: Secondary | ICD-10-CM | POA: Diagnosis not present

## 2021-08-28 DIAGNOSIS — M7522 Bicipital tendinitis, left shoulder: Secondary | ICD-10-CM | POA: Diagnosis present

## 2021-08-28 DIAGNOSIS — M47816 Spondylosis without myelopathy or radiculopathy, lumbar region: Secondary | ICD-10-CM | POA: Diagnosis not present

## 2021-08-28 DIAGNOSIS — Z5181 Encounter for therapeutic drug level monitoring: Secondary | ICD-10-CM

## 2021-08-28 DIAGNOSIS — R269 Unspecified abnormalities of gait and mobility: Secondary | ICD-10-CM

## 2021-08-28 DIAGNOSIS — G894 Chronic pain syndrome: Secondary | ICD-10-CM | POA: Diagnosis not present

## 2021-08-28 DIAGNOSIS — M961 Postlaminectomy syndrome, not elsewhere classified: Secondary | ICD-10-CM

## 2021-08-28 MED ORDER — FENTANYL 25 MCG/HR TD PT72: 1.0000 | MEDICATED_PATCH | TRANSDERMAL | 0 refills | Status: DC

## 2021-08-28 MED ORDER — OXYCODONE-ACETAMINOPHEN 10-325 MG PO TABS: 1.0000 | ORAL_TABLET | Freq: Four times a day (QID) | ORAL | 0 refills | Status: DC | PRN

## 2021-08-28 NOTE — Progress Notes (Signed)
Subjective:    Patient ID: Alexandria Mclaughlin, female    DOB: 28-Jul-1940, 81 y.o.   MRN: [id number]  HPI: Alexandria Mclaughlin is a 81 y.o. female who returns for follow up appointment for chronic pain and medication refill. She states her pain is located in her lower back radiating into her lower back radiating into her right buttock  and right lower extremity. She rates her pain 4. Her current exercise regime is attending physical therapy weekly and walking with her walker.   Alexandria Mclaughlin reports she's having memory changes, she states  at times she can't remember certain things when she is trying to recall a conversation with her son, till days later. A referral was sent to her neurologist, she agrees with plan. . We discussed Cognitive Testing, as well, she verbalizes understanding.   Alexandria Mclaughlin Morphine equivalent is 103.72 MME.   Last UDS was Performed on 06/20/18-23, it was consistent.     Pain Inventory Average Pain 4 Pain Right Now 4 My pain is constant and sharp  In the last 24 hours, has pain interfered with the following? General activity 8 Relation with others 7 Enjoyment of life 7 What TIME of day is your pain at its worst? daytime Sleep (in general) Fair  Pain is worse with: walking, sitting, and standing Pain improves with: rest, medication, and heat Relief from Meds: 5  Family History  Problem Relation Age of Onset   Diabetes Mother    Heart disease Mother    Stroke Mother    Heart disease Father    Heart disease Brother    Social History   Socioeconomic History   Marital status: Widowed    Spouse name: Not on file   Number of children: Not on file   Years of education: Not on file   Highest education level: Not on file  Occupational History   Not on file  Tobacco Use   Smoking status: Former    Packs/day: 0.50    Years: 5.00    Total pack years: 2.50    Types: Cigarettes    Quit date: 03/15/1968    Years since quitting: 53.4   Smokeless tobacco: Never  Vaping Use    Vaping Use: Never used  Substance and Sexual Activity   Alcohol use: Not Currently    Alcohol/week: 1.0 standard drink of alcohol    Types: 1 Glasses of wine per week    Comment: occasional glass of wine   Drug use: No   Sexual activity: Not on file  Other Topics Concern   Not on file  Social History Narrative   Right handed   Drinks caffeine   Condo two story with Media planner   Social Determinants of Health   Financial Resource Strain: Not on file  Food Insecurity: Not on file  Transportation Needs: Not on file  Physical Activity: Not on file  Stress: Not on file  Social Connections: Not on file   Past Surgical History:  Procedure Laterality Date   [place]     x2   RETINAL DETACHMENT SURGERY  9/13   TRANSFORAMINAL LUMBAR INTERBODY FUSION W/ MIS 1 LEVEL Right 04/26/2018   Procedure: Right Lumbar four-five Minimally invasive Transforaminal lumbar interbody fusion;  Surgeon: Judith Part, MD;  Location: [place];  Service: Neurosurgery;  Laterality: Right;   Past Surgical History:  Procedure Laterality Date   ABDOMINAL HYSTERECTOMY     BACK SURGERY     2011   [place]     x2   RETINAL DETACHMENT SURGERY  9/13   TRANSFORAMINAL LUMBAR INTERBODY FUSION W/ MIS 1 LEVEL Right 04/26/2018   Procedure: Right Lumbar four-five Minimally invasive Transforaminal lumbar interbody fusion;  Surgeon: Judith Part, MD;  Location: [place];  Service: Neurosurgery;  Laterality: Right;   Past Medical History:  Diagnosis Date   Cervical facet syndrome    Depression    Disorders of sacrum    Enthesopathy of hip region    GERD (gastroesophageal reflux disease)    Headache    History of kidney stones    Hypertension    Sciatic neuritis    Synovitis and tenosynovitis    Vision abnormalities    There were no vitals taken for this visit.  Opioid Risk Score:   Fall  Risk Score:  `1  Depression screen [facility] 2/9     06/19/2021    2:53 PM 02/12/2021    2:41 PM 12/13/2020    1:55 PM 10/17/2020    1:34 PM 07/24/2020    2:48 PM 05/09/2020    3:42 PM 09/15/2019    1:31 PM  Depression screen PHQ 2/9  Decreased Interest 1 0 1 0 0 0 0  Down, Depressed, Hopeless 1 0 1 0 0 0 0  PHQ - 2 Score 2 0 2 0 0 0 0    Review of Systems  Musculoskeletal:        Right hip pain  All other systems reviewed and are negative.      Objective:   Physical Exam Vitals and nursing note reviewed.  Constitutional:      Appearance: Normal appearance.  Cardiovascular:     Rate and Rhythm: Normal rate and regular rhythm.     Pulses: Normal pulses.     Heart sounds: Normal heart sounds.  Pulmonary:     Effort: Pulmonary effort is normal.     Breath sounds: Normal breath sounds.  Musculoskeletal:     Cervical back: Normal range of motion and neck supple.     Comments: Normal Muscle Bulk and Muscle Testing Reveals:  Upper Extremities:Full  ROM and Muscle Strength 5/5  Lumbar Paraspinal Tenderness: L-4- L-5 Right Sacral Tenderness Lower Extremities: Full ROM and Muscle Strength 5/5 Arises from Chair slowly using walker for support Antalgic  Gait     Skin:    General: Skin is warm and dry.  Neurological:     Mental Status: She is alert and oriented to person, place, and time.  Psychiatric:        Mood and Affect: Mood normal.        Behavior: Behavior normal.         Assessment & Plan:  1. Facet Arthropathy/ Sacroiliac Joint Dysfunction: 08/28/2021. Refilled: Fentanyl 25 mcg patch  # 10- one patch every 3 days and   Percocet 10/325 mg one every 6 hours as needed  # 110,  Second script  given for the following month.  Continue to Monitor. . We will continue the opioid monitoring program, this consists of regular clinic visits, examinations, urine drug screen, pill counts as well as use of [place] Controlled Substance Reporting system. A 12 month History has  been reviewed on the [place] on 08/28/2021.  2. Lumbar Radicular  Pain: Continue HEP as Tolerated.  Continue to Monitor. Continue Gabapentin and Cymbalta. 08/28/2021 3. Gait Disorder: Continue HEP / Neurology Following. 07/ 06/2021 4. Myoclonus with sleep: Continue Klonopin. Continue current medication regimen. Continue to Monitor. 08/28/2021 5. Memory Changes: RX: Neurology: For Cognitive Testing. Continue to Monitor.    F/U in 2 months

## 2021-08-29 ENCOUNTER — Other Ambulatory Visit: Payer: Self-pay | Admitting: Registered Nurse

## 2021-09-03 ENCOUNTER — Encounter: Payer: Self-pay | Admitting: Psychology

## 2021-10-10 DIAGNOSIS — E782 Mixed hyperlipidemia: Secondary | ICD-10-CM | POA: Diagnosis not present

## 2021-10-10 DIAGNOSIS — I700 Atherosclerosis of aorta: Secondary | ICD-10-CM | POA: Diagnosis not present

## 2021-10-10 DIAGNOSIS — F1190 Opioid use, unspecified, uncomplicated: Secondary | ICD-10-CM | POA: Diagnosis not present

## 2021-10-10 DIAGNOSIS — E0590 Thyrotoxicosis, unspecified without thyrotoxic crisis or storm: Secondary | ICD-10-CM | POA: Diagnosis not present

## 2021-10-10 DIAGNOSIS — G894 Chronic pain syndrome: Secondary | ICD-10-CM | POA: Diagnosis not present

## 2021-10-10 DIAGNOSIS — R7303 Prediabetes: Secondary | ICD-10-CM | POA: Diagnosis not present

## 2021-10-10 DIAGNOSIS — I10 Essential (primary) hypertension: Secondary | ICD-10-CM | POA: Diagnosis not present

## 2021-10-15 DIAGNOSIS — G9341 Metabolic encephalopathy: Secondary | ICD-10-CM | POA: Diagnosis not present

## 2021-10-15 DIAGNOSIS — M75101 Unspecified rotator cuff tear or rupture of right shoulder, not specified as traumatic: Secondary | ICD-10-CM

## 2021-10-15 DIAGNOSIS — R29898 Other symptoms and signs involving the musculoskeletal system: Secondary | ICD-10-CM | POA: Diagnosis not present

## 2021-10-15 DIAGNOSIS — G894 Chronic pain syndrome: Secondary | ICD-10-CM | POA: Diagnosis not present

## 2021-10-15 DIAGNOSIS — R262 Difficulty in walking, not elsewhere classified: Secondary | ICD-10-CM | POA: Diagnosis not present

## 2021-10-16 MED ORDER — METHYLPREDNISOLONE 4 MG PO TBPK: ORAL_TABLET | ORAL | 0 refills | Status: DC

## 2021-10-16 NOTE — Telephone Encounter (Signed)
Medrol dose pack rx sent to pharmacy

## 2021-10-17 DIAGNOSIS — M810 Age-related osteoporosis without current pathological fracture: Secondary | ICD-10-CM | POA: Diagnosis not present

## 2021-10-17 DIAGNOSIS — R2681 Unsteadiness on feet: Secondary | ICD-10-CM | POA: Diagnosis not present

## 2021-10-17 DIAGNOSIS — F1190 Opioid use, unspecified, uncomplicated: Secondary | ICD-10-CM | POA: Diagnosis not present

## 2021-10-17 DIAGNOSIS — E782 Mixed hyperlipidemia: Secondary | ICD-10-CM | POA: Diagnosis not present

## 2021-10-17 DIAGNOSIS — Z0000 Encounter for general adult medical examination without abnormal findings: Secondary | ICD-10-CM | POA: Diagnosis not present

## 2021-10-17 DIAGNOSIS — R7303 Prediabetes: Secondary | ICD-10-CM | POA: Diagnosis not present

## 2021-10-17 DIAGNOSIS — I10 Essential (primary) hypertension: Secondary | ICD-10-CM | POA: Diagnosis not present

## 2021-10-17 DIAGNOSIS — M8580 Other specified disorders of bone density and structure, unspecified site: Secondary | ICD-10-CM | POA: Diagnosis not present

## 2021-10-17 DIAGNOSIS — I700 Atherosclerosis of aorta: Secondary | ICD-10-CM | POA: Diagnosis not present

## 2021-10-18 DIAGNOSIS — E0590 Thyrotoxicosis, unspecified without thyrotoxic crisis or storm: Secondary | ICD-10-CM | POA: Diagnosis not present

## 2021-10-18 DIAGNOSIS — E041 Nontoxic single thyroid nodule: Secondary | ICD-10-CM | POA: Diagnosis not present

## 2021-10-29 ENCOUNTER — Encounter: Payer: Medicare HMO | Attending: Physical Medicine and Rehabilitation | Admitting: Registered Nurse

## 2021-10-29 ENCOUNTER — Encounter: Payer: Self-pay | Admitting: Registered Nurse

## 2021-10-29 VITALS — BP 142/87 | HR 73 | Ht 63.0 in | Wt 145.2 lb

## 2021-10-29 DIAGNOSIS — Z5181 Encounter for therapeutic drug level monitoring: Secondary | ICD-10-CM

## 2021-10-29 DIAGNOSIS — G894 Chronic pain syndrome: Secondary | ICD-10-CM | POA: Diagnosis not present

## 2021-10-29 DIAGNOSIS — M961 Postlaminectomy syndrome, not elsewhere classified: Secondary | ICD-10-CM | POA: Diagnosis not present

## 2021-10-29 DIAGNOSIS — R269 Unspecified abnormalities of gait and mobility: Secondary | ICD-10-CM | POA: Diagnosis not present

## 2021-10-29 DIAGNOSIS — M7061 Trochanteric bursitis, right hip: Secondary | ICD-10-CM | POA: Diagnosis not present

## 2021-10-29 DIAGNOSIS — M47816 Spondylosis without myelopathy or radiculopathy, lumbar region: Secondary | ICD-10-CM | POA: Diagnosis not present

## 2021-10-29 DIAGNOSIS — Z79891 Long term (current) use of opiate analgesic: Secondary | ICD-10-CM | POA: Diagnosis not present

## 2021-10-29 DIAGNOSIS — M7522 Bicipital tendinitis, left shoulder: Secondary | ICD-10-CM

## 2021-10-29 MED ORDER — FENTANYL 25 MCG/HR TD PT72: 1.0000 | MEDICATED_PATCH | TRANSDERMAL | 0 refills | Status: DC

## 2021-10-29 MED ORDER — OXYCODONE-ACETAMINOPHEN 10-325 MG PO TABS: 1.0000 | ORAL_TABLET | Freq: Four times a day (QID) | ORAL | 0 refills | Status: DC | PRN

## 2021-10-29 NOTE — Progress Notes (Signed)
Subjective:    Patient ID: Alexandria Mclaughlin, female    DOB: January 07, 1941, 81 y.o.   MRN: [id number]  HPI: Alexandria Mclaughlin is a 81 y.o. female who returns for follow up appointment for chronic pain and medication refill. She states her pain is located in her right shoulder, lower back pain , right hip pain and left foot pain, she states she will call her podiatrist to schedule an appointment. She rates her pain 4. Her  current exercise regime is walking and performing stretching exercises.  [name] Morphine equivalent is 107.67 MME. She is also prescribed Clonazepam .We have discussed the black box warning of using opioids and benzodiazepines. I highlighted the dangers of using these drugs together and discussed the adverse events including respiratory suppression, overdose, cognitive impairment and importance of compliance with current regimen. We will continue to monitor and adjust as indicated.   UDS ordered today      Pain Inventory Average Pain 4 Pain Right Now 4 My pain is constant, sharp, stabbing, and aching  In the last 24 hours, has pain interfered with the following? General activity 7 Relation with others 7 Enjoyment of life 7 What TIME of day is your pain at its worst? daytime Sleep (in general) Fair  Pain is worse with: walking, bending, and standing Pain improves with: rest, heat/ice, and medication Relief from Meds: 5  Family History  Problem Relation Age of Onset   Diabetes Mother    Heart disease Mother    Stroke Mother    Heart disease Father    Heart disease Brother    Social History   Socioeconomic History   Marital status: Widowed    Spouse name: Not on file   Number of children: Not on file   Years of education: Not on file   Highest education level: Not on file  Occupational History   Not on file  Tobacco Use   Smoking status: Former    Packs/day: 0.50    Years: 5.00    Total pack years: 2.50    Types: Cigarettes    Quit date: 03/15/1968    Years  since quitting: 53.6   Smokeless tobacco: Never  Vaping Use   Vaping Use: Never used  Substance and Sexual Activity   Alcohol use: Not Currently    Alcohol/week: 1.0 standard drink of alcohol    Types: 1 Glasses of wine per week    Comment: occasional glass of wine   Drug use: No   Sexual activity: Not on file  Other Topics Concern   Not on file  Social History Narrative   Right handed   Drinks caffeine   Condo two story with Media planner   Social Determinants of Health   Financial Resource Strain: Not on file  Food Insecurity: Not on file  Transportation Needs: Not on file  Physical Activity: Not on file  Stress: Not on file  Social Connections: Not on file   Past Surgical History:  Procedure Laterality Date   [place]     x2   RETINAL DETACHMENT SURGERY  9/13   TRANSFORAMINAL LUMBAR INTERBODY FUSION W/ MIS 1 LEVEL Right 04/26/2018   Procedure: Right Lumbar four-five Minimally invasive Transforaminal lumbar interbody fusion;  Surgeon: Judith Part, MD;  Location: [place];  Service: Neurosurgery;  Laterality: Right;   Past Surgical History:  Procedure  Laterality Date   ABDOMINAL HYSTERECTOMY     BACK SURGERY     2011   [place]     x2   RETINAL DETACHMENT SURGERY  9/13   TRANSFORAMINAL LUMBAR INTERBODY FUSION W/ MIS 1 LEVEL Right 04/26/2018   Procedure: Right Lumbar four-five Minimally invasive Transforaminal lumbar interbody fusion;  Surgeon: Judith Part, MD;  Location: [place];  Service: Neurosurgery;  Laterality: Right;   Past Medical History:  Diagnosis Date   Cervical facet syndrome    Depression    Disorders of sacrum    Enthesopathy of hip region    GERD (gastroesophageal reflux disease)    Headache    History of kidney stones    Hypertension    Sciatic neuritis    Synovitis and tenosynovitis    Vision abnormalities    BP (!)  142/87   Pulse 73   Ht '5\' 3"'$  (1.6 m)   Wt 145 lb 3.2 oz (65.9 kg)   SpO2 91%   BMI 25.72 kg/m   Opioid Risk Score:   Fall Risk Score:  `1  Depression screen [facility] 2/9     08/28/2021    2:31 PM 06/19/2021    2:53 PM 02/12/2021    2:41 PM 12/13/2020    1:55 PM 10/17/2020    1:34 PM 07/24/2020    2:48 PM 05/09/2020    3:42 PM  Depression screen PHQ 2/9  Decreased Interest 0 1 0 1 0 0 0  Down, Depressed, Hopeless 0 1 0 1 0 0 0  PHQ - 2 Score 0 2 0 2 0 0 0    Review of Systems  Musculoskeletal:        Right shoulder pain Right buttock pain  All other systems reviewed and are negative.     Objective:   Physical Exam Vitals and nursing note reviewed.  Constitutional:      Appearance: Normal appearance.  Cardiovascular:     Rate and Rhythm: Normal rate and regular rhythm.     Pulses: Normal pulses.     Heart sounds: Normal heart sounds.  Pulmonary:     Effort: Pulmonary effort is normal.     Breath sounds: Normal breath sounds.  Musculoskeletal:     Cervical back: Normal range of motion and neck supple.     Comments: Normal Muscle Bulk and Muscle Testing Reveals:  Upper Extremities: Right: Decreased ROM 45 Degrees and Muscle Strength 5/5 Right AC Joint Tenderness  Left Upper Extremity: Full ROM and Muscle Strength 4/5 Lumbar Paraspinal Tenderness: L-4-L-5 Right Greater Trochanter Tenderness  Lower Extremities: Full ROM and Muscle Strength 5/5 Arises from Table slowly using cane for support Narrow Based  Gait     Skin:    General: Skin is warm and dry.  Neurological:     Mental Status: She is alert and oriented to person, place, and time.  Psychiatric:        Mood and Affect: Mood normal.        Behavior: Behavior normal.         Assessment & Plan:  1. Facet Arthropathy/ Sacroiliac Joint Dysfunction: 10/29/2021. Refilled: Fentanyl 25 mcg patch  # 10- one patch every 3 days and   Percocet 10/325 mg one every 6 hours as needed  # 110,  Second script  given for the  following month.  Continue to Monitor. . We will continue the opioid monitoring program, this consists of regular clinic visits,  examinations, urine drug screen, pill counts as well as use of [place] Controlled Substance Reporting system. A 12 month History has been reviewed on the [place] on 10/29/2021.  2. Lumbar Radicular  Pain: Continue HEP as Tolerated. Continue to Monitor. Continue Gabapentin and Cymbalta. 10/29/2021 3. Gait Disorder: Continue HEP / Neurology Following. 09/ 06/2021 4. Myoclonus with sleep: Continue Klonopin. Continue current medication regimen. Continue to Monitor. 10/29/2021 5. Memory Changes:  Neurology:Following She has a scheduled appointment for Cognitive Testing. Continue to Monitor. 10/29/2021   F/U in 2 months

## 2021-10-31 LAB — TOXASSURE SELECT,+ANTIDEPR,UR

## 2021-11-01 ENCOUNTER — Telehealth: Payer: Self-pay | Admitting: *Deleted

## 2021-11-01 NOTE — Telephone Encounter (Signed)
Urine drug screen for this encounter is consistent for prescribed medication 

## 2021-11-14 DIAGNOSIS — M8000XD Age-related osteoporosis with current pathological fracture, unspecified site, subsequent encounter for fracture with routine healing: Secondary | ICD-10-CM | POA: Diagnosis not present

## 2021-11-15 DIAGNOSIS — G9341 Metabolic encephalopathy: Secondary | ICD-10-CM | POA: Diagnosis not present

## 2021-11-15 DIAGNOSIS — R29898 Other symptoms and signs involving the musculoskeletal system: Secondary | ICD-10-CM | POA: Diagnosis not present

## 2021-11-15 DIAGNOSIS — G894 Chronic pain syndrome: Secondary | ICD-10-CM | POA: Diagnosis not present

## 2021-11-15 DIAGNOSIS — R262 Difficulty in walking, not elsewhere classified: Secondary | ICD-10-CM | POA: Diagnosis not present

## 2021-11-22 ENCOUNTER — Other Ambulatory Visit: Payer: Self-pay | Admitting: Physical Medicine & Rehabilitation

## 2021-11-22 DIAGNOSIS — G253 Myoclonus: Secondary | ICD-10-CM

## 2021-11-22 NOTE — Telephone Encounter (Signed)
PMP was Reviewed.  Clonazepam e-scribed today.  My-Chart message sent to Ms Sanson regarding the above.

## 2021-11-26 DIAGNOSIS — H40023 Open angle with borderline findings, high risk, bilateral: Secondary | ICD-10-CM | POA: Diagnosis not present

## 2021-11-26 DIAGNOSIS — H353131 Nonexudative age-related macular degeneration, bilateral, early dry stage: Secondary | ICD-10-CM | POA: Diagnosis not present

## 2021-11-26 DIAGNOSIS — H33051 Total retinal detachment, right eye: Secondary | ICD-10-CM | POA: Diagnosis not present

## 2021-11-26 DIAGNOSIS — H35373 Puckering of macula, bilateral: Secondary | ICD-10-CM | POA: Diagnosis not present

## 2021-11-26 DIAGNOSIS — H532 Diplopia: Secondary | ICD-10-CM | POA: Diagnosis not present

## 2021-11-26 DIAGNOSIS — H5022 Vertical strabismus, left eye: Secondary | ICD-10-CM | POA: Diagnosis not present

## 2021-12-04 DIAGNOSIS — I10 Essential (primary) hypertension: Secondary | ICD-10-CM | POA: Diagnosis not present

## 2021-12-04 DIAGNOSIS — R072 Precordial pain: Secondary | ICD-10-CM | POA: Diagnosis not present

## 2021-12-04 DIAGNOSIS — R7303 Prediabetes: Secondary | ICD-10-CM | POA: Diagnosis not present

## 2021-12-04 DIAGNOSIS — R5383 Other fatigue: Secondary | ICD-10-CM | POA: Diagnosis not present

## 2021-12-04 DIAGNOSIS — G894 Chronic pain syndrome: Secondary | ICD-10-CM | POA: Diagnosis not present

## 2021-12-04 DIAGNOSIS — E782 Mixed hyperlipidemia: Secondary | ICD-10-CM | POA: Diagnosis not present

## 2021-12-08 ENCOUNTER — Other Ambulatory Visit: Payer: Self-pay | Admitting: Registered Nurse

## 2021-12-15 DIAGNOSIS — G894 Chronic pain syndrome: Secondary | ICD-10-CM | POA: Diagnosis not present

## 2021-12-15 DIAGNOSIS — G9341 Metabolic encephalopathy: Secondary | ICD-10-CM | POA: Diagnosis not present

## 2021-12-15 DIAGNOSIS — R262 Difficulty in walking, not elsewhere classified: Secondary | ICD-10-CM | POA: Diagnosis not present

## 2021-12-15 DIAGNOSIS — R29898 Other symptoms and signs involving the musculoskeletal system: Secondary | ICD-10-CM | POA: Diagnosis not present

## 2021-12-18 ENCOUNTER — Encounter: Payer: Medicare HMO | Admitting: Psychology

## 2021-12-25 ENCOUNTER — Encounter: Payer: Self-pay | Admitting: Registered Nurse

## 2021-12-25 ENCOUNTER — Encounter: Payer: Medicare HMO | Admitting: Psychology

## 2021-12-25 ENCOUNTER — Encounter: Payer: Medicare HMO | Attending: Physical Medicine and Rehabilitation | Admitting: Registered Nurse

## 2021-12-25 VITALS — BP 164/89 | HR 60 | Ht 63.0 in | Wt 146.0 lb

## 2021-12-25 DIAGNOSIS — M7061 Trochanteric bursitis, right hip: Secondary | ICD-10-CM | POA: Diagnosis not present

## 2021-12-25 DIAGNOSIS — G894 Chronic pain syndrome: Secondary | ICD-10-CM

## 2021-12-25 DIAGNOSIS — R269 Unspecified abnormalities of gait and mobility: Secondary | ICD-10-CM

## 2021-12-25 DIAGNOSIS — Z79891 Long term (current) use of opiate analgesic: Secondary | ICD-10-CM

## 2021-12-25 DIAGNOSIS — M7522 Bicipital tendinitis, left shoulder: Secondary | ICD-10-CM | POA: Diagnosis not present

## 2021-12-25 DIAGNOSIS — M961 Postlaminectomy syndrome, not elsewhere classified: Secondary | ICD-10-CM

## 2021-12-25 DIAGNOSIS — Z5181 Encounter for therapeutic drug level monitoring: Secondary | ICD-10-CM

## 2021-12-25 DIAGNOSIS — G253 Myoclonus: Secondary | ICD-10-CM

## 2021-12-25 DIAGNOSIS — M47816 Spondylosis without myelopathy or radiculopathy, lumbar region: Secondary | ICD-10-CM | POA: Diagnosis not present

## 2021-12-25 MED ORDER — FENTANYL 25 MCG/HR TD PT72: 1.0000 | MEDICATED_PATCH | TRANSDERMAL | 0 refills | Status: DC

## 2021-12-25 MED ORDER — OXYCODONE-ACETAMINOPHEN 10-325 MG PO TABS: 1.0000 | ORAL_TABLET | Freq: Four times a day (QID) | ORAL | 0 refills | Status: DC | PRN

## 2021-12-25 MED ORDER — CLONAZEPAM 0.5 MG PO TABS: ORAL_TABLET | ORAL | 2 refills | Status: DC

## 2021-12-25 NOTE — Progress Notes (Unsigned)
Subjective:    Patient ID: Alexandria Mclaughlin, female    DOB: Oct 22, 1940, 81 y.o.   MRN: [id number]  HPI: Alexandria Mclaughlin is a 81 y.o. female who returns for follow up appointment for chronic pain and medication refill. states *** pain is located in  ***. rates pain ***. current exercise regime is walking and performing stretching exercises.  Alexandria Mclaughlin Morphine equivalent is *** MME. She is also prescribed Clonazepam .We have discussed the black box warning of using opioids and benzodiazepines. I highlighted the dangers of using these drugs together and discussed the adverse events including respiratory suppression, overdose, cognitive impairment and importance of compliance with current regimen. We will continue to monitor and adjust as indicated.       Last UDS was Performed on 10/29/2021, it was consistent   Pain Inventory Average Pain 4 Pain Right Now 4 My pain is intermittent, sharp, and stabbing  In the last 24 hours, has pain interfered with the following? General activity 6 Relation with others 6 Enjoyment of life 6 What TIME of day is your pain at its worst? daytime Sleep (in general) Fair  Pain is worse with: walking, bending, and standing Pain improves with: rest, heat/ice, and medication Relief from Meds: 6  Family History  Problem Relation Age of Onset  . Diabetes Mother   . Heart disease Mother   . Stroke Mother   . Heart disease Father   . Heart disease Brother    Social History   Socioeconomic History  . Marital status: Widowed    Spouse name: Not on file  . Number of children: Not on file  . Years of education: Not on file  . Highest education level: Not on file  Occupational History  . Not on file  Tobacco Use  . Smoking status: Former    Packs/day: 0.50    Years: 5.00    Total pack years: 2.50    Types: Cigarettes    Quit date: 03/15/1968    Years since quitting: 53.8  . Smokeless tobacco: Never  Vaping Use  . Vaping Use: Never used  Substance and Sexual  Activity  . Alcohol use: Not Currently    Alcohol/week: 1.0 standard drink of alcohol    Types: 1 Glasses of wine per week    Comment: occasional glass of wine  . Drug use: No  . Sexual activity: Not on file  Other Topics Concern  . Not on file  Social History Narrative   Right handed   Drinks caffeine   Condo two story with Media planner   Social Determinants of Health   Financial Resource Strain: Not on file  Food Insecurity: Not on file  Transportation Needs: Not on file  Physical Activity: Not on file  Stress: Not on file  Social Connections: Not on file   Past Surgical History:  Procedure Laterality Date  . ABDOMINAL HYSTERECTOMY    . BACK SURGERY     2011  . BLADDER SURGERY  1985  . CESAREAN SECTION     x2  . RETINAL DETACHMENT SURGERY  9/13  . TRANSFORAMINAL LUMBAR INTERBODY FUSION W/ MIS 1 LEVEL Right 04/26/2018   Procedure: Right Lumbar four-five Minimally invasive Transforaminal lumbar interbody fusion;  Surgeon: Judith Part, MD;  Location: [place];  Service: Neurosurgery;  Laterality: Right;   Past Surgical History:  Procedure Laterality Date  . ABDOMINAL HYSTERECTOMY    . BACK SURGERY     2011  . BLADDER SURGERY  [place]     x2  . RETINAL DETACHMENT SURGERY  9/13  . TRANSFORAMINAL LUMBAR INTERBODY FUSION W/ MIS 1 LEVEL Right 04/26/2018   Procedure: Right Lumbar four-five Minimally invasive Transforaminal lumbar interbody fusion;  Surgeon: Judith Part, MD;  Location: [place];  Service: Neurosurgery;  Laterality: Right;   Past Medical History:  Diagnosis Date  . Cervical facet syndrome   . Depression   . Disorders of sacrum   . Enthesopathy of hip region   . GERD (gastroesophageal reflux disease)   . Headache   . History of kidney stones   . Hypertension   . Sciatic neuritis   . Synovitis and tenosynovitis   . Vision abnormalities    There were no vitals taken for this visit.  Opioid Risk Score:   Fall Risk Score:   `1  Depression screen [facility] 2/9     08/28/2021    2:31 PM 06/19/2021    2:53 PM 02/12/2021    2:41 PM 12/13/2020    1:55 PM 10/17/2020    1:34 PM 07/24/2020    2:48 PM 05/09/2020    3:42 PM  Depression screen PHQ 2/9  Decreased Interest 0 1 0 1 0 0 0  Down, Depressed, Hopeless 0 1 0 1 0 0 0  PHQ - 2 Score 0 2 0 2 0 0 0      Review of Systems  Musculoskeletal:        Buttocks pain  All other systems reviewed and are negative.     Objective:   Physical Exam        Assessment & Plan:  1. Facet Arthropathy/ Sacroiliac Joint Dysfunction: 10/29/2021. Refilled: Fentanyl 25 mcg patch  # 10- one patch every 3 days and   Percocet 10/325 mg one every 6 hours as needed  # 110,  Second script  given for the following month.  Continue to Monitor. . We will continue the opioid monitoring program, this consists of regular clinic visits, examinations, urine drug screen, pill counts as well as use of [place] Controlled Substance Reporting system. A 12 month History has been reviewed on the [place] on 10/29/2021.  2. Lumbar Radicular  Pain: Continue HEP as Tolerated. Continue to Monitor. Continue Gabapentin and Cymbalta. 10/29/2021 3. Gait Disorder: Continue HEP / Neurology Following. 09/ 06/2021 4. Myoclonus with sleep: Continue Klonopin. Continue current medication regimen. Continue to Monitor. 10/29/2021 5. Memory Changes:  Neurology:Following She has a scheduled appointment for Cognitive Testing. Continue to Monitor. 10/29/2021   F/U in 2 months

## 2022-01-08 DIAGNOSIS — M8000XD Age-related osteoporosis with current pathological fracture, unspecified site, subsequent encounter for fracture with routine healing: Secondary | ICD-10-CM | POA: Diagnosis not present

## 2022-01-15 DIAGNOSIS — R29898 Other symptoms and signs involving the musculoskeletal system: Secondary | ICD-10-CM | POA: Diagnosis not present

## 2022-01-15 DIAGNOSIS — G9341 Metabolic encephalopathy: Secondary | ICD-10-CM | POA: Diagnosis not present

## 2022-01-15 DIAGNOSIS — R262 Difficulty in walking, not elsewhere classified: Secondary | ICD-10-CM | POA: Diagnosis not present

## 2022-01-15 DIAGNOSIS — G894 Chronic pain syndrome: Secondary | ICD-10-CM | POA: Diagnosis not present

## 2022-01-20 DIAGNOSIS — E0590 Thyrotoxicosis, unspecified without thyrotoxic crisis or storm: Secondary | ICD-10-CM | POA: Diagnosis not present

## 2022-01-20 DIAGNOSIS — M8000XD Age-related osteoporosis with current pathological fracture, unspecified site, subsequent encounter for fracture with routine healing: Secondary | ICD-10-CM | POA: Diagnosis not present

## 2022-01-23 ENCOUNTER — Encounter: Payer: Self-pay | Admitting: Psychology

## 2022-01-23 DIAGNOSIS — F329 Major depressive disorder, single episode, unspecified: Secondary | ICD-10-CM | POA: Insufficient documentation

## 2022-01-23 DIAGNOSIS — K219 Gastro-esophageal reflux disease without esophagitis: Secondary | ICD-10-CM | POA: Insufficient documentation

## 2022-01-24 ENCOUNTER — Encounter: Payer: Self-pay | Admitting: Psychology

## 2022-01-24 ENCOUNTER — Ambulatory Visit: Payer: Medicare HMO | Admitting: Psychology

## 2022-01-24 DIAGNOSIS — F331 Major depressive disorder, recurrent, moderate: Secondary | ICD-10-CM

## 2022-01-24 DIAGNOSIS — R4189 Other symptoms and signs involving cognitive functions and awareness: Secondary | ICD-10-CM

## 2022-01-24 DIAGNOSIS — I6381 Other cerebral infarction due to occlusion or stenosis of small artery: Secondary | ICD-10-CM | POA: Diagnosis not present

## 2022-01-24 DIAGNOSIS — G3184 Mild cognitive impairment, so stated: Secondary | ICD-10-CM

## 2022-01-24 DIAGNOSIS — F411 Generalized anxiety disorder: Secondary | ICD-10-CM | POA: Diagnosis not present

## 2022-01-24 HISTORY — DX: Mild cognitive impairment of uncertain or unknown etiology: G31.84

## 2022-01-24 NOTE — Progress Notes (Signed)
   Psychometrician Note   Cognitive testing was administered to Alexandria Mclaughlin by Alexandria Mclaughlin, B.S. (psychometrist) under the supervision of Dr. Christia Mclaughlin, Ph.D., licensed psychologist on 01/24/2022. Alexandria Mclaughlin did not appear overtly distressed by the testing session per behavioral observation or responses across self-report questionnaires. Rest breaks were offered.    The battery of tests administered was selected by Dr. Christia Mclaughlin, Ph.D. with consideration to Alexandria Mclaughlin's current level of functioning, the nature of her symptoms, emotional and behavioral responses during interview, level of literacy, observed level of motivation/effort, and the nature of the referral question. This battery was communicated to the psychometrist. Communication between Dr. Christia Mclaughlin, Ph.D. and the psychometrist was ongoing throughout the evaluation and Dr. Christia Mclaughlin, Ph.D. was immediately accessible at all times. Dr. Christia Mclaughlin, Ph.D. provided supervision to the psychometrist on the date of this service to the extent necessary to assure the quality of all services provided.    Alexandria Mclaughlin will return within approximately 1-2 weeks for an interactive feedback session with Alexandria Mclaughlin at which time her test performances, clinical impressions, and treatment recommendations will be reviewed in detail. Alexandria Mclaughlin understands she can contact our office should she require our assistance before this time.  A total of 125 minutes of billable time were spent face-to-face with Alexandria Mclaughlin by the psychometrist. This includes both test administration and scoring time. Billing for these services is reflected in the clinical report generated by Dr. Christia Mclaughlin, Ph.D.  This note reflects time spent with the psychometrician and does not include test scores or any clinical interpretations made by Alexandria Mclaughlin. The full report will follow in a separate note.

## 2022-01-24 NOTE — Progress Notes (Unsigned)
NEUROPSYCHOLOGICAL EVALUATION [facility] of Neurology  Date of Evaluation: January 24, 2022  Reason for Referral:   Alexandria Mclaughlin is a 81 y.o. right-handed Caucasian female referred by  Danella Sensing, NP , to characterize her current cognitive functioning and assist with diagnostic clarity and treatment planning in the context of subjective cognitive decline.   Assessment and Plan:   Clinical Impression(s): Ms. Newcombe's pattern of performance is suggestive of a isolated impairment across essentially all aspects of receptive and expressive language. Phonemic fluency was generally appropriate. However, dysfunction was seen across general language comprehension, sentence repetition, semantic fluency, and confrontation naming. Some variability was additionally exhibited across cognitive flexibility. Performances were appropriate across all other assessed cognitive domains. This includes processing speed, attention/concentration, safety/judgment, visuospatial abilities, and all aspects of learning and memory. Ms. Alarid denied difficulties completing instrumental activities of daily living (ADLs) independently and her son was in agreement with this. As such, given evidence for cognitive dysfunction described above, she meets criteria for a Mild Neurocognitive Disorder ("mild cognitive impairment") at the present time.  The etiology for ongoing language impairment is unclear. Neurologically speaking, prominent language impairment can be a concerning sign for the language variant of frontotemporal lobar degeneration (i.e., a primary progressive aphasia or PPA presentation). With that being said, her age is quite advanced relative to when this sort of illness typically presents. She also did not display prominent language dysfunction during interview and recent neuroimaging did not suggest advanced atrophy in patterns concerning for this illness and its subtypes. While  neuroimaging over the years have revealed several small strokes, several of these locations (i.e., basal ganglia, superior frontal gyrus) are not strongly associated with widespread language impairment. While damage to the left thalamus could create some language dysfunction, this event was described as a tiny lacunar infarct and widespread impairment would be surprising given its size. While I cannot rule out a primary vascular cause or a very late onset PPA presentation, neither can be stated with confidence presently. Ongoing psychiatric distress and medication side effects could also be playing a role in ongoing dysfunction. However, language impairment exceeds what would be expected from these contributions in isolation as well. Continued monitoring will be important moving forward.   All aspects of learning and memory were consistently strong and there is no evidence to warrant concern surrounding underlying Alzheimer's disease. Current testing and behavioral patterns are also not suggestive of Lewy body disease, Parkinson's disease or other more rare parkinsonian conditions, or the behavioral variant of frontotemporal lobar degeneration.  Recommendations: A repeat neuropsychological evaluation in 18-24 months (or sooner if greater day-to-day language decline is noted) is recommended to assess the trajectory of future cognitive decline should it occur. This will also aid in future efforts towards improved diagnostic clarity.  If interested, Alexandria Mclaughlin could discuss a referral to a speech and language pathologist with either Dr. Tomi Likens or her PCP. This individual could directly address ongoing language impairments and engage her in ongoing treatment.   It should be highlighted that several of her current medications (namely clonazepam/Klonopin, fentanyl, and oxycodone) have well known cognitive side effects that can negatively impact day-to-day functioning.   A combination of medication and psychotherapy  has been shown to be most effective at treating symptoms of anxiety and depression. As such, Alexandria Mclaughlin is encouraged to speak with her prescribing physician regarding medication adjustments to optimally manage these symptoms. Likewise, Alexandria Mclaughlin is encouraged to consider engaging in short-term psychotherapy  to address symptoms of psychiatric distress. She would benefit from an active and collaborative therapeutic environment, rather than one purely supportive in nature. Recommended treatment modalities include Cognitive Behavioral Therapy (CBT) or Acceptance and Commitment Therapy (ACT).  Performance across neurocognitive testing is not a strong predictor of an individual's safety operating a motor vehicle. Should her family wish to pursue a formalized driving evaluation, they could reach out to the following agencies: [employer] in [place]: [phone] Driver Rehabilitative Services: [facility]: [place]: [phone] or [phone]  Should there be a progression of current deficits over time, she is unlikely to regain any independent living skills lost. Therefore, it is recommended that she remain as involved as possible in all aspects of household chores, finances, and medication management, with supervision to ensure adequate performance. She will likely benefit from the establishment and maintenance of a routine in order to maximize functional abilities over time.  Alexandria Mclaughlin is encouraged to attend to lifestyle factors for brain health (e.g., regular physical exercise, good nutrition habits, regular participation in cognitively-stimulating activities, and general stress management techniques), which are likely to have benefits for both emotional adjustment and cognition. In fact, in addition to promoting good general health, regular exercise incorporating aerobic activities (e.g., brisk walking, jogging, cycling, etc.) has been demonstrated to  be a very effective treatment for depression and stress, with similar efficacy rates to both antidepressant medication and psychotherapy. Optimal control of vascular risk factors (including safe cardiovascular exercise and adherence to dietary recommendations) is encouraged. Continued participation in activities which provide mental stimulation and social interaction is also recommended.   To address problems with executive dysfunction, she may wish to consider:   -Avoiding external distractions when needing to concentrate   -Limiting exposure to fast paced environments with multiple sensory demands   -Writing down complicated information and using checklists   -Attempting and completing one task at a time (i.e., no multi-tasking)   -Verbalizing aloud each step of a task to maintain focus   -Taking frequent breaks during the completion of steps/tasks to avoid fatigue   -Reducing the amount of information considered at one time  Review of Records:   Alexandria Mclaughlin was seen by [facility] Metta Clines, D.O.) on 02/06/2021 for an evaluation of gait instability. Records suggest Alexandria Mclaughlin initially being seen by another neurology clinic for balance instability in 2017. Said concerns were thought to be related to a "tiny MRI-negative brainstem infarct" as Alexandria Mclaughlin was describing trouble with double vision. Balance problems resolved with time. She has longstanding chronic low back pain and a history of lumbar spine surgery with post-laminectomy syndrome, lumbar facet arthropathy, and sacroiliac joint dysfunction. Medications include fentanyl, oxycodone-acetaminophen, gabapentin, duloxetine, trazodone, and clonazepam (for myoclonus). There is also a longstanding history of dizziness/BPPV for which she takes meclizine as needed. Progressive balance instability resurfaced in August 2022 requiring her to ambulate with a cane and walker. She denied dizziness or numbness causing symptoms. She was admitted to the ED on  12/27/2020 due to confusion, slurred speech, and right foot weakness. Brain MRI did not reveal an acute intracranial abnormality. Head and neck CTA did not reveal LVO or hemodynamically significant stenosis. An MRI of her lumbar spine revealed lumbar spondylosis and degenerative disc disease causing moderate to prominent central narrowing of the thecal sac at L4-5 and moderate impingement at L3-4. Neurosurgical intervention was not felt to be warranted. B12 levels were low. No clear etiology for the transient encephalopathy was found  but no recurrence was noted. Since this hospitalization, her right leg will shake or jerk at times.  NCV/EMG on 04/04/2021 revealed chronic L5-S1 radiculopathy affecting to right lower extremity of mild to moderate severity. No large fiber sensorimotor polyneuropathy was noted.   An awake and drowsy EEG on 04/10/2021 was normal.  She most recently met with Dr. Tomi Likens on 05/07/2021 for follow-up. At that time, Dr. Tomi Likens suspected that balance instability may be caused by a prior posterior circulation stroke occurring around 2017. Other contributing factors would likely include lumbar spine disease, arthritis, and polypharmacy.  She was seen by [facility] Danella Sensing, NP) on 08/28/2021 for follow-up for chronic pain related medication refills. At that time, she described memory changes surrounding trouble recalling details of past conversations. Ultimately, Alexandria Mclaughlin was referred for a comprehensive neuropsychological evaluation to characterize her cognitive abilities and to assist with diagnostic clarity and treatment planning.   Neuroimaging Brain MRI on 06/18/2011 in the context of left facial numbness revealed mild age-related atrophy and mild microvascular ischemic changes in the periventricular and subcortical white matter. Brain MRI on 01/31/2015 revealed a new small subcortical infarct in the right superior frontal gyrus, as well as moderate microvascular ischemic changes, said to be  progressive relative to her prior scan. Brain MRA on 01/31/2015 was unremarkable. Brain MRI on 12/01/2018 was stable relative to previous imaging. Additional lacunar infarcts "similar to [her] prior MRI" were reported within the bilateral basal ganglia and left thalamus. No convincing evidence for an acute infarct was noted. Brain MRI on 12/28/2020 was stable to prior scans. No convincing evidence for an acute infarct was noted.   Past Medical History:  Diagnosis Date   Abnormality of gait 02/07/2015   Acquired hallux rigidus of left foot 12/17/2020   Acquired unequal leg length on left 08/08/2011   AMS (altered mental status) 12/28/2020   Biceps tendonitis on left 08/18/2018   BPPV (benign paroxysmal positional vertigo) 02/07/2015   Bunion 12/17/2020   Cerebrovascular disease    Cervical facet syndrome    Disorders of sacrum    Enthesopathy of hip region    Essential hypertension 02/07/2015   Generalized anxiety disorder    GERD (gastroesophageal reflux disease)    Headache    History of kidney stones    HLD (hyperlipidemia) 02/07/2015   Low back pain 02/07/2015   Lumbar facet arthropathy 07/05/2014   Lumbar post-laminectomy syndrome 06/11/2011   Lumbar radicular pain 07/29/2016   Lumbar radiculopathy 04/26/2018   Major depressive disorder    Mild cognitive impairment of uncertain or unknown etiology 01/24/2022   Multiple lacunar infarcts    MRI - bilateral basal ganglia and left thalamus   Myoclonus 09/19/2015   Right leg weakness    Rotator cuff tendonitis, left 06/08/2018   Sacroiliac joint dysfunction 06/11/2011   Sciatic neuritis    Subcortical infarction    2016 MRI - small chronic subcortical infarct with associated chronic hemosiderin deposition within the right superior frontal gyrus.   Synovitis and tenosynovitis    Therapeutic opioid induced constipation 07/25/2015   Vision abnormalities     Past Surgical History:  Procedure Laterality Date   ABDOMINAL HYSTERECTOMY      BACK SURGERY     2011   BLADDER SURGERY  1985   CESAREAN SECTION     x2   RETINAL DETACHMENT SURGERY  9/13   TRANSFORAMINAL LUMBAR INTERBODY FUSION W/ MIS 1 LEVEL Right 04/26/2018   Procedure: Right Lumbar four-five Minimally invasive Transforaminal lumbar interbody fusion;  Surgeon: Judith Part, MD;  Location: [facility];  Service: Neurosurgery;  Laterality: Right;    Current Outpatient Medications:    aspirin-acetaminophen-caffeine (EXCEDRIN MIGRAINE) 250-250-65 MG tablet, Take 1 tablet by mouth every 6 (six) hours as needed for headache or migraine., Disp: , Rfl:    Calcium Polycarbophil (FIBER-CAPS PO), , Disp: , Rfl:    clonazePAM (KLONOPIN) 0.5 MG tablet, TAKE 1 TABLET (0.5 MG TOTAL) BY MOUTH AT BEDTIME AS NEEDED FOR ANXIETY (SLEEP)., Disp: 30 tablet, Rfl: 2   cyanocobalamin 1000 MCG tablet, 1 tablet, Disp: , Rfl:    DULoxetine (CYMBALTA) 30 MG capsule, TAKE 1 CAPSULE BY MOUTH EVERY DAY, Disp: 90 capsule, Rfl: 2   fentaNYL (DURAGESIC) 25 MCG/HR, Place 1 patch onto the skin every 3 (three) days., Disp: 10 patch, Rfl: 0   gabapentin (NEURONTIN) 600 MG tablet, TAKE 1 TABLET BY MOUTH 4 TIMES A DAY, Disp: 360 tablet, Rfl: 1   irbesartan (AVAPRO) 150 MG tablet, Take 150 mg by mouth daily., Disp: , Rfl:    Melatonin 5 MG CAPS, Take 5 mg by mouth at bedtime as needed (sleep)., Disp: , Rfl:    methimazole (TAPAZOLE) 5 MG tablet, Take 5 mg by mouth 3 (three) times daily., Disp: , Rfl:    ondansetron (ZOFRAN) 4 MG tablet, Take 4 mg by mouth 3 (three) times daily as needed., Disp: , Rfl:    oxyCODONE-acetaminophen (PERCOCET) 10-325 MG tablet, Take 1 tablet by mouth every 6 (six) hours as needed for pain. Do Not Fill Before 02/03/2022, Disp: 110 tablet, Rfl: 0   rosuvastatin (CRESTOR) 20 MG tablet, Take 20 mg by mouth at bedtime., Disp: , Rfl:    traZODone (DESYREL) 100 MG tablet, TAKE 1 TABLET BY MOUTH EVERYDAY AT BEDTIME, Disp: 90 tablet, Rfl: 2  Clinical Interview:   The following  information was obtained during a clinical interview with Alexandria Mclaughlin and her son prior to cognitive testing.  Cognitive Symptoms: Decreased short-term memory: Endorsed. Primary difficulties surrounded word finding deficits. She also described trouble remembering upcoming appointments and recalling details of movies or television shows she has recently watched. She described being quite reliant on taking written notes to improve memory abilities. Difficulties were said to be present for the past six of so months. Her son noted that when they move din together two years prior, no concerns were noted then, also suggesting a more recent change.  Decreased long-term memory: Denied. Decreased attention/concentration: Endorsed. She reported variable difficulties with sustained attention and increased distractibility.  Reduced processing speed: Endorsed. Difficulties with executive functions: Endorsed. She noted some trouble with organization and multi-tasking, but stated that this may be better accounted for my memory lapses and her forgetting the goal she was trying to accomplish. She denied trouble with impulsivity. Outside of having a somewhat shorter fuse and being more irritable, no significant personality changes were noted. Her son was in agreement with this.  Difficulties with emotion regulation: Denied. Difficulties with receptive language: Denied. Difficulties with word finding: Endorsed. Decreased visuoperceptual ability: Denied.  Difficulties completing ADLs: Denied. Her son was in agreement with this.   Additional Medical History: History of traumatic brain injury/concussion: Unclear. About five or so years prior, she reported mis-stepping off a curb and falling while in [place], ultimately striking her head on the ground. She denied a loss in consciousness but did require several stiches. No persisting difficulties were reported. No other head injuries were described.  History of stroke: Endorsed  (see above).  History of  seizure activity: Denied. History of known exposure to toxins: Denied. Symptoms of chronic pain: Endorsed (see above regarding back pain). Experience of frequent headaches/migraines: Denied. Frequent instances of dizziness/vertigo: Denied. However, dizzy spells were noted to occur.   Sensory changes: She experiences some double vision and uses glasses with some benefit. She also utilizes hearing aids with benefit. She denied changes or difficulties surrounding taste or smell.  Balance/coordination difficulties: Endorsed (see above). Her and her son described significant balance instability in November 2022 caused by a reported stroke. Notably, neuroimaging at that time did not show convicting evidence or an acute infarct. Since that time, balance was said to have steadily improved. She no longer needs to ambulate with a walker or cane and described only a "little bit" of instability currently. No recent falls were reported.  Other motor difficulties: Denied.  Sleep History: Estimated hours obtained each night: 8-10 hours.  Difficulties falling asleep: Denied while taking melatonin.  Difficulties staying asleep: Denied. Feels rested and refreshed upon awakening: Endorsed.  History of snoring: Unclear.  History of waking up gasping for air: Endorsed. She also reported waking due to gagging or choking sensations.  Witnessed breath cessation while asleep: Unclear. She did acknowledge that previous clinicians have raised concerns for sleep apnea. She described choosing to "ignore" these concerns and has not completed a formal sleep study or pursued CPAP treatment.   History of vivid dreaming: Denied. Excessive movement while asleep: Denied. Instances of acting out her dreams: Denied.  Psychiatric/Behavioral Health History: Depression: Endorsed. She reported a period of depression in 2006 following the passing of her husband. Per her son, her husband committed suicide in  a violent manner and it was Ms. Hoobler who first discovered him. She acknowledged depression and potential traumatic stress surrounding this event. She described her current mood as "so-so." She denied current or remote suicidal ideation, intent, or plan.  Anxiety: Endorsed. She reported generally mild generalized anxious distress. She does take clonazepam/Klonopin daily. However, she stated that this was primary to improve myoclonic jerking.  Mania: Denied. Trauma History: Endorsed (see above). She did not report being previously diagnosed with PTSD.  Visual/auditory hallucinations: Denied. Delusional thoughts: Denied.  Tobacco: Denied. Alcohol: She denied current alcohol consumption as well as a history of problematic alcohol abuse or dependence.  Recreational drugs: Denied.  Family History: Problem Relation Age of Onset   Diabetes Mother    Heart disease Mother    Stroke Mother    Heart disease Father    Heart disease Brother    This information was confirmed by Alexandria Mclaughlin.  Academic/Vocational History: Highest level of educational attainment: 14 years. She graduated from high school and completed an Associate's degree. 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 Math was noted as a relative weakness.  History of developmental delay: Denied. History of grade repetition: Denied. Enrollment in special education courses: Denied. History of LD/ADHD: Denied.  Employment: Retired. She previously worked in [employer] supplements.   Evaluation Results:   Behavioral Observations: Alexandria Mclaughlin was accompanied by her son, arrived to her appointment on time, and was appropriately dressed and groomed. She appeared alert and oriented. Observed gait and station were within normal limits. Gross motor functioning appeared intact upon informal observation and no abnormal movements (e.g., tremors) were noted. Her affect was generally relaxed and positive, but  did range appropriately given the subject being discussed during the clinical interview or the task at hand during testing procedures. Spontaneous  speech was fluent and word finding difficulties were not observed during the clinical interview. Thought processes were coherent, organized, and normal in content. Insight into her cognitive difficulties appeared adequate. However, ongoing language impairment may be more pronounced than what she is able to fully appreciate.   During testing, sustained attention was appropriate. Word finding difficulties were noted. Comprehension difficulties were also noted, especially across more complex tasks. Task engagement was adequate and she persisted when challenged. She did fatigue as the evaluation progressed. It was abbreviated in response. Overall, Alexandria Mclaughlin was cooperative with the clinical interview and subsequent testing procedures.   Adequacy of Effort: The validity of neuropsychological testing is limited by the extent to which the individual being tested may be assumed to have exerted adequate effort during testing. Alexandria Mclaughlin expressed her intention to perform to the best of her abilities and exhibited adequate task engagement and persistence. Scores across stand-alone and embedded performance validity measures were within expectation. As such, the results of the current evaluation are believed to be a valid representation of Alexandria Mclaughlin's current cognitive functioning.  Test Results: Alexandria Mclaughlin was fully oriented at the time of the current evaluation.  Intellectual abilities based upon educational and vocational attainment were estimated to be in the average range. Premorbid abilities were estimated to be within the above average range based upon a single-word reading test.   Processing speed was ***. Basic attention was ***. More complex attention (e.g., working memory) was ***. Executive functioning was ***.  Assessed receptive language abilities were ***.  Likewise, Alexandria Mclaughlin did not exhibit any difficulties comprehending task instructions and answered all questions asked of her appropriately. Assessed expressive language (e.g., verbal fluency and confrontation naming) was ***.     Assessed visuospatial/visuoconstructional abilities were ***.    Learning (i.e., encoding) of novel verbal and visual information was ***. Spontaneous delayed recall (i.e., retrieval) of previously learned information was ***. Retention rates were ***% across a story learning task, ***% across a list learning task, and ***% across a shape learning task. Performance across recognition tasks was ***, suggesting *** evidence for information consolidation.   Results of emotional screening instruments suggested that recent symptoms of generalized anxiety were in the mild to moderate range, while symptoms of depression were within the severe range. A screening instrument assessing recent sleep quality suggested the presence of minimal sleep dysfunction.  Tables of Scores:   Note: This summary of test scores accompanies the interpretive report and should not be considered in isolation without reference to the appropriate sections in the text. Descriptors are based on appropriate normative data and may be adjusted based on clinical judgment. Terms such as "Within Normal Limits" and "Outside Normal Limits" are used when a more specific description of the test score cannot be determined.       Percentile - Normative Descriptor > 98 - Exceptionally High 91-97 - Well Above Average 75-90 - Above Average 25-74 - Average 9-24 - Below Average 2-8 - Well Below Average < 2 - Exceptionally Low       Orientation:      Raw Score Percentile   NAB Orientation, Form 1 29/29 --- ---       Cognitive Screening:      Raw Score Percentile   SLUMS: 21/30 --- ---       RBANS, Form A: Standard Score/ Scaled Score Percentile   Total Score 98 45 Average  Immediate Memory 114 82 Above Average     List Learning 13  84 Above Average    Story Memory 12 75 Above Average  Visuospatial/Constructional 100 50 Average    Figure Copy 10 50 Average    Line Orientation 16/20 26-50 Average  Language 75 5 Well Below Average    Picture Naming 8/10 3-9 Well Below Average    Semantic Fluency 6 9 Below Average  Attention 100 50 Average    Digit Span 9 37 Average    Coding 11 63 Average  Delayed Memory 107 68 Average    List Recall 4/10 51-75 Average    List Recognition 20/20 51-75 Average    Story Recall 11 63 Average    Story Recognition 11/12 68-86 Average to Above Average    Figure Recall 11 63 Average    Figure Recognition 6/8 58-68 Average        Intellectual Functioning:      Standard Score Percentile   Test of Premorbid Functioning: 112 79 Above Average       Attention/Executive Function:     Trail Making Test (TMT): Raw Score (Scaled Score) Percentile     Part A 57 secs.,  0 errors (9) 37 Average    Part B Discontinued --- Impaired  *Based on [facility] Older Normative Studies (MOANS)           Scaled Score Percentile   WAIS-IV Digit Span: 10 50 Average    Forward 8 25 Average    Backward 13 84 Above Average    Sequencing 9 37 Average       D-KEFS Verbal Fluency Test: Raw Score (Scaled Score) Percentile     Letter Total Correct 28 (9) 37 Average    Category Total Correct 16 (4) 2 Well Below Average    Category Switching Total Correct 12 (12) 75 Above Average    Category Switching Accuracy 10 (11) 63 Average      Total Set Loss Errors 0 (13) 84 Above Average      Total Repetition Errors 3 (10) 50 Average       NAB Executive Functions Module, Form 1: T Score Percentile     Judgment 49 46 Average       Language:      Raw Score Percentile   Sentence Repetition: 12/22 3 Well Below Average       Verbal Fluency Test: Raw Score (T Score) Percentile     Phonemic Fluency (FAS) 28 (39) 14 Below Average    Animal Fluency 8 (25) 1 Exceptionally Low        NAB Language Module,  Form 1: T Score Percentile     Auditory Comprehension 31 3 Well Below Average    Naming 22/31 (30) 2 Well Below Average       Visuospatial/Visuoconstruction:      Raw Score Percentile   Clock Drawing: 9/10 --- Within Normal Limits       Mood and Personality:      Raw Score Percentile   Geriatric Depression Scale: 20 --- Severe  Geriatric Anxiety Scale: 21 --- Mild    Somatic 6 --- Mild    Cognitive 7 --- Moderate    Affective 8 --- Moderate       Additional Questionnaires:      Raw Score Percentile   PROMIS Sleep Disturbance Questionnaire: 21 --- None to Slight   Informed Consent and Coding/Compliance:   The current evaluation represents a clinical evaluation for the purposes previously outlined by the referral source and is in no way reflective of a forensic  evaluation.   Ms. Rymer was provided with a verbal description of the nature and purpose of the present neuropsychological evaluation. Also reviewed were the foreseeable risks and/or discomforts and benefits of the procedure, limits of confidentiality, and mandatory reporting requirements of this provider. The patient was given the opportunity to ask questions and receive answers about the evaluation. Oral consent to participate was provided by the patient.   This evaluation was conducted by Christia Reading, Ph.D., ABPP-CN, board certified clinical neuropsychologist. Ms. Wieland completed a clinical interview with Dr. Melvyn Novas, billed as one unit [phone], and 125 minutes of cognitive testing and scoring, billed as one unit [phone] and three additional units [id number]. Psychometrist Milana Kidney, B.S., assisted Dr. Melvyn Novas with test administration and scoring procedures. As a separate and discrete service, Dr. Melvyn Novas spent a total of 160 minutes in interpretation and report writing billed as one unit [phone] and two units [id number].

## 2022-01-30 ENCOUNTER — Ambulatory Visit (INDEPENDENT_AMBULATORY_CARE_PROVIDER_SITE_OTHER): Payer: Medicare HMO | Admitting: Psychology

## 2022-01-30 DIAGNOSIS — F411 Generalized anxiety disorder: Secondary | ICD-10-CM | POA: Diagnosis not present

## 2022-01-30 DIAGNOSIS — F331 Major depressive disorder, recurrent, moderate: Secondary | ICD-10-CM

## 2022-01-30 DIAGNOSIS — I6381 Other cerebral infarction due to occlusion or stenosis of small artery: Secondary | ICD-10-CM

## 2022-01-30 DIAGNOSIS — G3184 Mild cognitive impairment, so stated: Secondary | ICD-10-CM

## 2022-01-30 NOTE — Progress Notes (Signed)
   Neuropsychology Feedback Session Tillie Rung. [facility] [facility] of Neurology  Reason for Referral:   Alexandria Mclaughlin is a 81 y.o. right-handed Caucasian female referred by  Danella Sensing, NP , to characterize her current cognitive functioning and assist with diagnostic clarity and treatment planning in the context of subjective cognitive decline.   Feedback:   Alexandria Mclaughlin completed a comprehensive neuropsychological evaluation on 01/24/2022. Please refer to that encounter for the full report and recommendations. Briefly, results suggested an isolated impairment across essentially all aspects of receptive and expressive language. Phonemic fluency was generally appropriate. However, dysfunction was seen across general language comprehension, sentence repetition, semantic fluency, and confrontation naming. Some variability was additionally exhibited across cognitive flexibility. Performances were appropriate across all other assessed cognitive domains. The etiology for ongoing language impairment is unclear. Neurologically speaking, prominent language impairment can be a concerning sign for the language variant of frontotemporal lobar degeneration (i.e., a primary progressive aphasia or PPA presentation). With that being said, her age is quite advanced relative to when this sort of illness typically presents. She also did not display prominent language dysfunction during interview and recent neuroimaging did not suggest advanced atrophy in patterns concerning for this illness and its subtypes. While neuroimaging over the years has revealed several small strokes, several of these locations (i.e., basal ganglia, superior frontal gyrus) are not strongly associated with widespread language impairment. While damage to the left thalamus could create some language dysfunction, this event was described as a tiny lacunar infarct and widespread impairment would be surprising given its size, Alexandria Mclaughlin's  lack of reported language dysfunction, and the timeline of these events (i.e., cognitive dysfunction was reported to first be noticed in the past six months while no new strokes can be found across brain scans since 2016). Of these etiologies, a vascular etiology would seem more likely. However, a very late onset PPA presentation certainly cannot be ruled out. Ongoing psychiatric distress and medication side effects could also be playing a role in exacerbating ongoing dysfunction. However, language impairment exceeds what would be expected from these contributions in isolation as well. Continued monitoring will be important moving forward.   Alexandria Mclaughlin was unaccompanied during the current telephone call. She was within her residence while I was within my office. I discussed the limitations of evaluation and management by telemedicine and the availability of in person appointments. Alexandria Mclaughlin expressed her understanding and agreed to proceed. Content of the current session focused on the results of her neuropsychological evaluation. Alexandria Mclaughlin was given the opportunity to ask questions and her questions were answered. She was encouraged to reach out should additional questions arise. A copy of her report was mailed at the conclusion of the visit.      17 minutes were spent preparing for, conducting, and documenting the current feedback session with Alexandria Mclaughlin, billed as one unit [phone].

## 2022-02-05 DIAGNOSIS — N390 Urinary tract infection, site not specified: Secondary | ICD-10-CM | POA: Diagnosis not present

## 2022-02-12 ENCOUNTER — Other Ambulatory Visit: Payer: Self-pay | Admitting: Physical Medicine & Rehabilitation

## 2022-02-12 DIAGNOSIS — M5416 Radiculopathy, lumbar region: Secondary | ICD-10-CM

## 2022-02-12 DIAGNOSIS — M961 Postlaminectomy syndrome, not elsewhere classified: Secondary | ICD-10-CM

## 2022-02-25 ENCOUNTER — Encounter: Payer: Medicare HMO | Attending: Physical Medicine and Rehabilitation | Admitting: Registered Nurse

## 2022-02-25 ENCOUNTER — Encounter: Payer: Self-pay | Admitting: Registered Nurse

## 2022-02-25 VITALS — BP 152/93 | HR 71 | Ht 63.0 in | Wt 140.0 lb

## 2022-02-25 DIAGNOSIS — M961 Postlaminectomy syndrome, not elsewhere classified: Secondary | ICD-10-CM | POA: Insufficient documentation

## 2022-02-25 DIAGNOSIS — M47816 Spondylosis without myelopathy or radiculopathy, lumbar region: Secondary | ICD-10-CM

## 2022-02-25 DIAGNOSIS — G894 Chronic pain syndrome: Secondary | ICD-10-CM | POA: Insufficient documentation

## 2022-02-25 DIAGNOSIS — M7061 Trochanteric bursitis, right hip: Secondary | ICD-10-CM | POA: Diagnosis not present

## 2022-02-25 DIAGNOSIS — M7522 Bicipital tendinitis, left shoulder: Secondary | ICD-10-CM

## 2022-02-25 DIAGNOSIS — M533 Sacrococcygeal disorders, not elsewhere classified: Secondary | ICD-10-CM | POA: Diagnosis not present

## 2022-02-25 DIAGNOSIS — Z79891 Long term (current) use of opiate analgesic: Secondary | ICD-10-CM | POA: Diagnosis not present

## 2022-02-25 DIAGNOSIS — M778 Other enthesopathies, not elsewhere classified: Secondary | ICD-10-CM | POA: Diagnosis not present

## 2022-02-25 DIAGNOSIS — G253 Myoclonus: Secondary | ICD-10-CM

## 2022-02-25 DIAGNOSIS — I10 Essential (primary) hypertension: Secondary | ICD-10-CM | POA: Diagnosis not present

## 2022-02-25 DIAGNOSIS — R269 Unspecified abnormalities of gait and mobility: Secondary | ICD-10-CM

## 2022-02-25 DIAGNOSIS — Z5181 Encounter for therapeutic drug level monitoring: Secondary | ICD-10-CM | POA: Diagnosis not present

## 2022-02-25 MED ORDER — OXYCODONE-ACETAMINOPHEN 10-325 MG PO TABS: 1.0000 | ORAL_TABLET | Freq: Four times a day (QID) | ORAL | 0 refills | Status: DC | PRN

## 2022-02-25 MED ORDER — METHYLPREDNISOLONE 4 MG PO TBPK: ORAL_TABLET | ORAL | 0 refills | Status: DC

## 2022-02-25 MED ORDER — FENTANYL 25 MCG/HR TD PT72: 1.0000 | MEDICATED_PATCH | TRANSDERMAL | 0 refills | Status: DC

## 2022-02-25 NOTE — Patient Instructions (Signed)
Keep a Blood Pressure Journal and Call PCP with Readings.

## 2022-02-25 NOTE — Progress Notes (Unsigned)
Subjective:    Patient ID: Alexandria Mclaughlin, female    DOB: 20-May-1940, 82 y.o.   MRN: [id number]  HPI: Alexandria Mclaughlin is a 82 y.o. female who returns for follow up appointment for chronic pain and medication refill. states *** pain is located in  ***. rates pain ***. current exercise regime is walking and performing stretching exercises.     Pain Inventory Average Pain 5 Pain Right Now 5 My pain is constant, sharp, and stabbing  In the last 24 hours, has pain interfered with the following? General activity 7 Relation with others 7 Enjoyment of life 7 What TIME of day is your pain at its worst? daytime Sleep (in general) Fair  Pain is worse with: walking, sitting, and standing Pain improves with: rest, heat/ice, and medication Relief from Meds: 6  Family History  Problem Relation Age of Onset   Diabetes Mother    Heart disease Mother    Stroke Mother    Heart disease Father    Heart disease Brother    Social History   Socioeconomic History   Marital status: Widowed    Spouse name: Not on file   Number of children: Not on file   Years of education: 14   Highest education level: Associate degree: academic program  Occupational History   Occupation: Retired  Tobacco Use   Smoking status: Former    Packs/day: 0.50    Years: 5.00    Total pack years: 2.50    Types: Cigarettes    Quit date: 03/15/1968    Years since quitting: 53.9   Smokeless tobacco: Never  Vaping Use   Vaping Use: Never used  Substance and Sexual Activity   Alcohol use: Not Currently    Alcohol/week: 1.0 standard drink of alcohol    Types: 1 Glasses of wine per week    Comment: occasional glass of wine   Drug use: No   Sexual activity: Not on file  Other Topics Concern   Not on file  Social History Narrative   Right handed   Drinks caffeine   Condo two story with Media planner   Social Determinants of Health   Financial Resource Strain: Not on file  Food Insecurity: Not on file  Transportation  Needs: Not on file  Physical Activity: Not on file  Stress: Not on file  Social Connections: Not on file   Past Surgical History:  Procedure Laterality Date   [place]     x2   RETINAL DETACHMENT SURGERY  9/13   TRANSFORAMINAL LUMBAR INTERBODY FUSION W/ MIS 1 LEVEL Right 04/26/2018   Procedure: Right Lumbar four-five Minimally invasive Transforaminal lumbar interbody fusion;  Surgeon: Judith Part, MD;  Location: [facility];  Service: Neurosurgery;  Laterality: Right;   Past Surgical History:  Procedure Laterality Date   ABDOMINAL HYSTERECTOMY     BACK SURGERY     2011   [place]     x2   RETINAL DETACHMENT SURGERY  9/13   TRANSFORAMINAL LUMBAR INTERBODY FUSION W/ MIS 1 LEVEL Right 04/26/2018   Procedure: Right Lumbar four-five Minimally invasive Transforaminal lumbar interbody fusion;  Surgeon: Judith Part, MD;  Location: [facility];  Service: Neurosurgery;  Laterality: Right;   Past Medical History:  Diagnosis Date   Abnormality of gait 02/07/2015   Acquired hallux rigidus  of left foot 12/17/2020   Acquired unequal leg length on left 08/08/2011   AMS (altered mental status) 12/28/2020   Biceps tendonitis on left 08/18/2018   BPPV (benign paroxysmal positional vertigo) 02/07/2015   Bunion 12/17/2020   Cerebrovascular disease    Cervical facet syndrome    Disorders of sacrum    Enthesopathy of hip region    Essential hypertension 02/07/2015   Generalized anxiety disorder    GERD (gastroesophageal reflux disease)    Headache    History of kidney stones    HLD (hyperlipidemia) 02/07/2015   Low back pain 02/07/2015   Lumbar facet arthropathy 07/05/2014   Lumbar post-laminectomy syndrome 06/11/2011   Lumbar radicular pain 07/29/2016   Lumbar radiculopathy 04/26/2018   Major depressive disorder    Mild cognitive impairment of uncertain or unknown  etiology 01/24/2022   Multiple lacunar infarcts    MRI - bilateral basal ganglia and left thalamus   Myoclonus 09/19/2015   Right leg weakness    Rotator cuff tendonitis, left 06/08/2018   Sacroiliac joint dysfunction 06/11/2011   Sciatic neuritis    Subcortical infarction    2016 MRI - small chronic subcortical infarct with associated chronic hemosiderin deposition within the right superior frontal gyrus.   Synovitis and tenosynovitis    Therapeutic opioid induced constipation 07/25/2015   Vision abnormalities    There were no vitals taken for this visit.  Opioid Risk Score:   Fall Risk Score:  `1  Depression screen [facility] 2/9     08/28/2021    2:31 PM 06/19/2021    2:53 PM 02/12/2021    2:41 PM 12/13/2020    1:55 PM 10/17/2020    1:34 PM 07/24/2020    2:48 PM 05/09/2020    3:42 PM  Depression screen PHQ 2/9  Decreased Interest 0 1 0 1 0 0 0  Down, Depressed, Hopeless 0 1 0 1 0 0 0  PHQ - 2 Score 0 2 0 2 0 0 0    Review of Systems  Musculoskeletal:  Positive for back pain.  All other systems reviewed and are negative.     Objective:   Physical Exam        Assessment & Plan:

## 2022-02-26 ENCOUNTER — Telehealth: Payer: Self-pay | Admitting: Registered Nurse

## 2022-02-26 DIAGNOSIS — N390 Urinary tract infection, site not specified: Secondary | ICD-10-CM | POA: Diagnosis not present

## 2022-02-26 NOTE — Telephone Encounter (Signed)
PMP was reviewed.  Last clonazepam was filled on 02/20/2022. My-Chart message sent to Ms. Bushong regarding the above.

## 2022-02-27 ENCOUNTER — Encounter: Payer: Medicare HMO | Admitting: Registered Nurse

## 2022-03-04 ENCOUNTER — Encounter: Payer: Medicare HMO | Admitting: Psychology

## 2022-03-12 ENCOUNTER — Telehealth: Payer: Self-pay | Admitting: Registered Nurse

## 2022-03-12 NOTE — Telephone Encounter (Signed)
Call placed to Alexandria Mclaughlin, regarding her My- Chart message, no answer, left message to return the call.

## 2022-03-12 NOTE — Telephone Encounter (Signed)
I called the  pharmacy and was told [facility] Falling paid in January 2024 for the  pain medication scripts. If she has received a notice from her insurance they are unaware.

## 2022-03-12 NOTE — Telephone Encounter (Signed)
Alexandria Mclaughlin,  Alexandria Mclaughlin a message stating Alexandria Mclaughlin stated the Fentanyl is non formulary. Can you look into this for me, when you have a chance. I have the letter that explains our office if you need it.

## 2022-03-14 ENCOUNTER — Telehealth: Payer: Self-pay

## 2022-03-14 NOTE — Telephone Encounter (Signed)
PA submitted for Fentanyl. The patient currently has access to the requested medication and a Prior Authorization is not needed for the patient/medication.

## 2022-03-17 ENCOUNTER — Emergency Department (HOSPITAL_COMMUNITY): Payer: Medicare HMO

## 2022-03-17 ENCOUNTER — Emergency Department (HOSPITAL_COMMUNITY)
Admission: EM | Admit: 2022-03-17 | Discharge: 2022-03-17 | Disposition: A | Discharge status: Home / Self Care | Payer: Medicare HMO | Attending: Emergency Medicine | Admitting: Emergency Medicine

## 2022-03-17 ENCOUNTER — Other Ambulatory Visit: Payer: Self-pay

## 2022-03-17 ENCOUNTER — Encounter (HOSPITAL_COMMUNITY): Payer: Self-pay

## 2022-03-17 DIAGNOSIS — I10 Essential (primary) hypertension: Secondary | ICD-10-CM | POA: Diagnosis not present

## 2022-03-17 DIAGNOSIS — N838 Other noninflammatory disorders of ovary, fallopian tube and broad ligament: Secondary | ICD-10-CM | POA: Diagnosis not present

## 2022-03-17 DIAGNOSIS — G9341 Metabolic encephalopathy: Secondary | ICD-10-CM | POA: Diagnosis not present

## 2022-03-17 DIAGNOSIS — R29898 Other symptoms and signs involving the musculoskeletal system: Secondary | ICD-10-CM | POA: Diagnosis not present

## 2022-03-17 DIAGNOSIS — Z743 Need for continuous supervision: Secondary | ICD-10-CM | POA: Diagnosis not present

## 2022-03-17 DIAGNOSIS — R1084 Generalized abdominal pain: Secondary | ICD-10-CM | POA: Diagnosis not present

## 2022-03-17 DIAGNOSIS — R1031 Right lower quadrant pain: Secondary | ICD-10-CM | POA: Insufficient documentation

## 2022-03-17 DIAGNOSIS — Z79899 Other long term (current) drug therapy: Secondary | ICD-10-CM | POA: Diagnosis not present

## 2022-03-17 DIAGNOSIS — G894 Chronic pain syndrome: Secondary | ICD-10-CM | POA: Diagnosis not present

## 2022-03-17 DIAGNOSIS — R262 Difficulty in walking, not elsewhere classified: Secondary | ICD-10-CM | POA: Diagnosis not present

## 2022-03-17 DIAGNOSIS — R109 Unspecified abdominal pain: Secondary | ICD-10-CM | POA: Diagnosis not present

## 2022-03-17 DIAGNOSIS — N83291 Other ovarian cyst, right side: Secondary | ICD-10-CM | POA: Diagnosis not present

## 2022-03-17 LAB — URINALYSIS, ROUTINE W REFLEX MICROSCOPIC
Bacteria, UA: NONE SEEN
Bilirubin Urine: NEGATIVE
Glucose, UA: NEGATIVE mg/dL
Ketones, ur: NEGATIVE mg/dL
Nitrite: NEGATIVE
Protein, ur: NEGATIVE mg/dL
Specific Gravity, Urine: >1.046 — ABNORMAL HIGH (ref 1.005–1.030)
pH: 7 (ref 5.0–8.0)

## 2022-03-17 LAB — CBC WITH DIFFERENTIAL/PLATELET
Abs Immature Granulocytes: 0.02 10*3/uL (ref 0.00–0.07)
Basophils Absolute: 0 10*3/uL (ref 0.0–0.1)
Basophils Relative: 0 %
Eosinophils Absolute: 0 10*3/uL (ref 0.0–0.5)
Eosinophils Relative: 0 %
HCT: 45.2 % (ref 36.0–46.0)
Hemoglobin: 13.4 g/dL (ref 12.0–15.0)
Immature Granulocytes: 0 %
Lymphocytes Relative: 23 %
Lymphs Abs: 1.8 10*3/uL (ref 0.7–4.0)
MCH: 27.6 pg (ref 26.0–34.0)
MCHC: 29.6 g/dL — ABNORMAL LOW (ref 30.0–36.0)
MCV: 93 fL (ref 80.0–100.0)
Monocytes Absolute: 0.6 10*3/uL (ref 0.1–1.0)
Monocytes Relative: 8 %
Neutro Abs: 5.4 10*3/uL (ref 1.7–7.7)
Neutrophils Relative %: 69 %
Platelets: 237 10*3/uL (ref 150–400)
RBC: 4.86 MIL/uL (ref 3.87–5.11)
RDW: 13.6 % (ref 11.5–15.5)
WBC: 7.9 10*3/uL (ref 4.0–10.5)
nRBC: 0 % (ref 0.0–0.2)

## 2022-03-17 LAB — I-STAT CHEM 8, ED
BUN: 16 mg/dL (ref 8–23)
Calcium, Ion: 1.32 mmol/L (ref 1.15–1.40)
Chloride: 102 mmol/L (ref 98–111)
Creatinine, Ser: 0.4 mg/dL — ABNORMAL LOW (ref 0.44–1.00)
Glucose, Bld: 107 mg/dL — ABNORMAL HIGH (ref 70–99)
HCT: 45 % (ref 36.0–46.0)
Hemoglobin: 15.3 g/dL — ABNORMAL HIGH (ref 12.0–15.0)
Potassium: 4.3 mmol/L (ref 3.5–5.1)
Sodium: 142 mmol/L (ref 135–145)
TCO2: 28 mmol/L (ref 22–32)

## 2022-03-17 LAB — COMPREHENSIVE METABOLIC PANEL
ALT: 11 U/L (ref 0–44)
AST: 20 U/L (ref 15–41)
Albumin: 4.2 g/dL (ref 3.5–5.0)
Alkaline Phosphatase: 119 U/L (ref 38–126)
Anion gap: 10 (ref 5–15)
BUN: 17 mg/dL (ref 8–23)
CO2: 28 mmol/L (ref 22–32)
Calcium: 9.7 mg/dL (ref 8.9–10.3)
Chloride: 103 mmol/L (ref 98–111)
Creatinine, Ser: 0.51 mg/dL (ref 0.44–1.00)
GFR, Estimated: >60 mL/min (ref >60)
Glucose, Bld: 113 mg/dL — ABNORMAL HIGH (ref 70–99)
Potassium: 4.4 mmol/L (ref 3.5–5.1)
Sodium: 141 mmol/L (ref 135–145)
Total Bilirubin: 0.8 mg/dL (ref 0.3–1.2)
Total Protein: 8.1 g/dL (ref 6.5–8.1)

## 2022-03-17 LAB — LIPASE, BLOOD: Lipase: 31 U/L (ref 11–51)

## 2022-03-17 LAB — LACTIC ACID, PLASMA: Lactic Acid, Venous: 1.1 mmol/L (ref 0.5–1.9)

## 2022-03-17 MED ORDER — HYDROMORPHONE HCL 1 MG/ML IJ SOLN
1.0000 mg | Freq: Once | INTRAMUSCULAR | Status: AC
  Administered 2022-03-17: 1 mg via INTRAVENOUS
  Filled 2022-03-17: qty 1

## 2022-03-17 MED ORDER — MORPHINE SULFATE (PF) 4 MG/ML IV SOLN
4.0000 mg | Freq: Once | INTRAVENOUS | Status: AC
  Administered 2022-03-17: 4 mg via INTRAVENOUS
  Filled 2022-03-17: qty 1

## 2022-03-17 MED ORDER — SODIUM CHLORIDE (PF) 0.9 % IJ SOLN
INTRAMUSCULAR | Status: AC
  Filled 2022-03-17: qty 50

## 2022-03-17 MED ORDER — ONDANSETRON HCL 4 MG/2ML IJ SOLN
4.0000 mg | Freq: Once | INTRAMUSCULAR | Status: AC
  Administered 2022-03-17: 4 mg via INTRAVENOUS
  Filled 2022-03-17: qty 2

## 2022-03-17 MED ORDER — IOHEXOL 300 MG/ML  SOLN
100.0000 mL | Freq: Once | INTRAMUSCULAR | Status: AC | PRN
  Administered 2022-03-17: 100 mL via INTRAVENOUS

## 2022-03-17 NOTE — ED Triage Notes (Signed)
BIBA from PCP for r/o appendicitis.  Pt reports lower abd pain x4 days described as dull at first and now sharp/stabbing.  Denies n/v

## 2022-03-17 NOTE — ED Provider Triage Note (Signed)
Emergency Medicine Provider Triage Evaluation Note  Alexandria Mclaughlin , a 82 y.o. female  was evaluated in triage.  Pt complains of right lower quadrant pain for the past 4 days been gradually worsening.  Denies any nausea, vomiting, or diarrhea.  Denies any fevers.  Reports been gradually worsening since then.  Review of Systems  Positive:  Negative:   Physical Exam  BP (!) 160/89 (BP Location: Left Arm)   Pulse 78   Temp 98.2 F (36.8 C) (Oral)   Resp 18   SpO2 97%  Gen:   Awake, no distress   Resp:  Normal effort  MSK:   Moves extremities without difficulty  Other:  Diffuse abdominal tenderness mainly in the right lower quadrant.  Some guarding present.  Medical Decision Making  Medically screening exam initiated at 12:56 PM.  Appropriate orders placed.  MCKENLEE MANGHAM was informed that the remainder of the evaluation will be completed by another provider, this initial triage assessment does not replace that evaluation, and the importance of remaining in the ED until their evaluation is complete.  CT and labs ordered. Patient is being lined and labbed currently. Spoke with CT for concern for her abdominal exam. They will come and get her once the Istat has resulted.      Sherrell Puller, [place] 03/17/22 1313

## 2022-03-17 NOTE — ED Provider Notes (Signed)
[place] [facility] Provider Note   CSN: [id number] Arrival date & time: 03/17/22  1218     History  Chief Complaint  Patient presents with   Abdominal Pain    Alexandria Mclaughlin is a 82 y.o. female.  With PMH of HTN, HLD, GERD, chronic pain who presents with new right lower quadrant and lower abdominal pain that has been stabbing in nature over the past 4 to 5 days.  Patient was sent here by her doctor for concern for appendicitis.  It hurts with movement and palpation.  She has had no associated fevers, nausea, vomiting, diarrhea, dysuria, hematuria or change in bowel movements.  She has never had pain similar to this.  Her chronic pain meds including fentanyl patches and as needed Percocet have not been helping with pain. No reported h/o cancer.   Abdominal Pain      Home Medications Prior to Admission medications   Medication Sig Start Date End Date Taking? Authorizing Provider  aspirin-acetaminophen-caffeine (EXCEDRIN MIGRAINE) [phone] MG tablet Take 1 tablet by mouth every 6 (six) hours as needed for headache or migraine.    [provider]  Calcium Polycarbophil (FIBER-CAPS PO)     [provider]  clonazePAM (KLONOPIN) 0.5 MG tablet TAKE 1 TABLET (0.5 MG TOTAL) BY MOUTH AT BEDTIME AS NEEDED FOR ANXIETY (SLEEP). 12/25/21   Bayard Hugger, NP  cyanocobalamin 1000 MCG tablet 1 tablet    [provider]  DULoxetine (CYMBALTA) 30 MG capsule TAKE 1 CAPSULE BY MOUTH EVERY DAY 04/10/21   Bayard Hugger, NP  fentaNYL (DURAGESIC) 25 MCG/HR Place 1 patch onto the skin every 3 (three) days. 02/25/22 02/25/23  Bayard Hugger, NP  gabapentin (NEURONTIN) 600 MG tablet TAKE 1 TABLET BY MOUTH FOUR TIMES A DAY 02/13/22   Bayard Hugger, NP  irbesartan (AVAPRO) 150 MG tablet Take 150 mg by mouth daily.    [provider]  Melatonin 5 MG CAPS Take 5 mg by mouth at bedtime as needed (sleep).    [provider]   methimazole (TAPAZOLE) 5 MG tablet Take 5 mg by mouth 3 (three) times daily.    [provider]  methylPREDNISolone (MEDROL DOSEPAK) 4 MG TBPK tablet Use as Directed 02/25/22   Bayard Hugger, NP  ondansetron (ZOFRAN) 4 MG tablet Take 4 mg by mouth 3 (three) times daily as needed. 06/12/21   [provider]  oxyCODONE-acetaminophen (PERCOCET) 10-325 MG tablet Take 1 tablet by mouth every 6 (six) hours as needed for pain. Do Not Fill Before 03/07/2022 02/25/22 02/25/23  Bayard Hugger, NP  rosuvastatin (CRESTOR) 20 MG tablet Take 20 mg by mouth at bedtime.    [provider]  traZODone (DESYREL) 100 MG tablet TAKE 1 TABLET BY MOUTH EVERYDAY AT BEDTIME 12/09/21   Bayard Hugger, NP      Allergies    Patient has no known allergies.    Review of Systems   Review of Systems  Gastrointestinal:  Positive for abdominal pain.    Physical Exam Updated Vital Signs BP (!) 177/94 (BP Location: Right Arm)   Pulse 78   Temp 98 F (36.7 C) (Oral)   Resp 18   SpO2 100%  Physical Exam Constitutional: Alert and oriented. Uncomfortable but nontoxic Eyes: Conjunctivae are normal. ENT      Mouth/Throat: Mucous membranes are moist.      Neck: No stridor. Cardiovascular: S1, S2,  regular rate.Warm and well  perfused. Respiratory: Normal respiratory effort.O2 sat 98 on RAl. Gastrointestinal: Soft and nondistended with LLQ, suprapubic and RLQ ttp, worst in RLQ with voluntary guarding Musculoskeletal: Normal range of motion in all extremities. Neurologic: Normal speech and language. No gross focal neurologic deficits are appreciated. Skin: Skin is warm, dry and intact. No rash noted. Psychiatric: Mood and affect are normal. Speech and behavior are normal.   ED Results / Procedures / Treatments   Labs (all labs ordered are listed, but only abnormal results are displayed) Labs Reviewed  CBC WITH DIFFERENTIAL/PLATELET - Abnormal; Notable for the following components:       Result Value   MCHC 29.6 (*)    All other components within normal limits  COMPREHENSIVE METABOLIC PANEL - Abnormal; Notable for the following components:   Glucose, Bld 113 (*)    All other components within normal limits  I-STAT CHEM 8, ED - Abnormal; Notable for the following components:   Creatinine, Ser 0.40 (*)    Glucose, Bld 107 (*)    Hemoglobin 15.3 (*)    All other components within normal limits  LIPASE, BLOOD  LACTIC ACID, PLASMA  URINALYSIS, ROUTINE W REFLEX MICROSCOPIC  LACTIC ACID, PLASMA    EKG None  Radiology CT ABDOMEN PELVIS W CONTRAST  Result Date: 03/17/2022 CLINICAL DATA:  Right lower quadrant abdominal pain. EXAM: CT ABDOMEN AND PELVIS WITH CONTRAST TECHNIQUE: Multidetector CT imaging of the abdomen and pelvis was performed using the standard protocol following bolus administration of intravenous contrast. RADIATION DOSE REDUCTION: This exam was performed according to the departmental dose-optimization program which includes automated exposure control, adjustment of the mA and/or kV according to patient size and/or use of iterative reconstruction technique. CONTRAST:  134m OMNIPAQUE IOHEXOL 300 MG/ML  SOLN COMPARISON:  CT abdomen/pelvis 05/10/2014. FINDINGS: Lower chest: Unchanged 5 mm nodule in the right lower lobe. No consolidation. No pleural or pericardial effusion. Coronary artery calcifications. Hepatobiliary: No focal liver abnormality is seen. No gallstones, gallbladder wall thickening, or biliary dilatation. Pancreas: Unremarkable. No pancreatic ductal dilatation or surrounding inflammatory changes. Spleen: Normal. Adrenals/Urinary Tract: Adrenal glands are unremarkable. Kidneys are normal, without renal calculi, focal lesion, or hydronephrosis. Bladder is unremarkable. Stomach/Bowel: Small hiatal hernia. Stomach is otherwise unremarkable. Normal duodenum. No dilated loops of small bowel. Appendix is not visualized. Soft tissue density along the anterior and  right lateral aspects of the cecum are highly suspicious for peritoneal carcinomatosis along the omentum in the right paracolic recess. Vascular/Lymphatic: Aortic atherosclerosis. No enlarged abdominal or pelvic lymph nodes. Reproductive: Uterus is not visualized. A heterogeneous right adnexal mass measuring 3.8 x 3.0 cm on image 69 of series 2. Associated soft tissue thickening along the peritoneal reflections and greater omentum (for example on coronal image 38 of series 6 and axial image 55 of series 2) is highly suspicious for peritoneal carcinomatosis. Other: No abdominal wall hernia. No abdominopelvic ascites. Musculoskeletal: Unchanged L1 compression fracture seen on prior lumbar spine MRI dated 12/29/2020. Postoperative changes of prior L4-5 interbody and posterior spinal fusion with acquired fusion of the L5-S1 segment. No suspicious bone lesions. IMPRESSION: 1. Heterogeneous right adnexal mass measuring 3.8 x 3.0 cm with associated soft tissue thickening along the peritoneal reflections and greater omentum. Findings are highly suspicious for ovarian neoplasm with peritoneal carcinomatosis. 2. No evidence of bowel obstruction. 3. Coronary artery calcifications. Aortic Atherosclerosis (ICD10-I70.0). Electronically Signed   By: WEmmit AlexandersM.D.   On: 03/17/2022 14:39    Procedures Procedures    Medications Ordered in ED  Medications  sodium chloride (PF) 0.9 % injection (has no administration in time range)  iohexol (OMNIPAQUE) 300 MG/ML solution 100 mL (100 mLs Intravenous Contrast Given 03/17/22 1354)    ED Course/ Medical Decision Making/ A&P Clinical Course as of 03/17/22 2000  Mon Mar 17, 2022  1638 [place] outpatient [VB]  9147 Reassessed patient, her pain is under control she has had no nausea or vomiting.  She is able to tolerate p.o.  She has home pain meds she can continue taking for pain control.  I spoke with Bernadene Bell of gynecologic oncology who can arrange for  patient to be seen outpatient tomorrow in clinic for further workup of concern of new ovarian cancer. [VB]    Clinical Course User Index [VB] Elgie Congo, MD   {                            Medical Decision Making JACQUELIN KRAJEWSKI is a 82 y.o. female.  With PMH of HTN, HLD, GERD, chronic pain who presents with new right lower quadrant and lower abdominal pain that has been stabbing in nature over the past 4 to 5 days.    Based on the patient's right lower quadrant pain, differential includes but is not limited to appendicitis, UTI, ovarian pathology, SBO, atypical diverticulitis, colitis. Less likely nephrolithiasis or pyelonephritis with no CVAT and no urinary symptoms.   Labs obtained and personally reviewed by me normal white blood cell count 7.9.  Creatinine 0.  5 1.  Lactate unremarkable.  CTAP with IV contrast obtained which showed evidence of heterogeneous right adnexal mass with associated thickening along the greater omentum concerning for ovarian neoplasm with peritoneal carcinomatosis.  Personally reviewed imaging and agree with this read, no evidence of SBO, no appendicitis.  Pain controlled in ED and discussed case with on-call gynecologic oncologist Dr. Bernadene Bell.  Since patient's pain was controlled in ED, discharged with strict return precautions and plans for patient to follow-up tomorrow in the [place] center with Dr. Alfonse Spruce.  Patient does have pain medications at home which she can take.  She is tolerating p.o. and safe for discharge at this time.  ---      Amount and/or Complexity of Data Reviewed Labs: ordered.  Risk Prescription drug management.    Final Clinical Impression(s) / ED Diagnoses Final diagnoses:  None    Rx / DC Orders ED Discharge Orders     None         Elgie Congo, MD 03/17/22 2007

## 2022-03-17 NOTE — Discharge Instructions (Addendum)
You were seen for abdominal pain today.  Your pain was controlled with IV pain meds in the ER.  Your CT scan showed evidence of an abnormal right-sided ovarian mass as listed below.  As we discussed, this needs further workup in the cancer center to diagnose.   You will be called first thing tomorrow morning by the cancer center at [facility] for appointment tomorrow likely after 1 PM.  You will be seeing Dr. Bernadene Bell at the [facility]:  [address], [place], [place] [address] Phone:  [phone]    Continue to take your home pain medications and double dose as needed for pain still uncontrolled.  Do not take with alcohol or drive while taking these medications.  If your pain is still severe and uncontrolled and you are unable to be seen by the cancer center or if you are unable to keep anything down and have uncontrolled vomiting or high fevers with pain, do not hesitate to come back to the ER for reevaluation.  CT read: " Heterogeneous right adnexal mass measuring 3.8 x 3.0 cm with  associated soft tissue thickening along the peritoneal reflections  and greater omentum. Findings are highly suspicious for ovarian  neoplasm with peritoneal carcinomatosis.

## 2022-03-18 ENCOUNTER — Inpatient Hospital Stay: Payer: Medicare HMO

## 2022-03-18 ENCOUNTER — Encounter: Payer: Self-pay | Admitting: Psychiatry

## 2022-03-18 ENCOUNTER — Inpatient Hospital Stay: Payer: Medicare HMO | Attending: Psychiatry | Admitting: Psychiatry

## 2022-03-18 ENCOUNTER — Telehealth: Payer: Self-pay

## 2022-03-18 ENCOUNTER — Inpatient Hospital Stay (HOSPITAL_BASED_OUTPATIENT_CLINIC_OR_DEPARTMENT_OTHER): Payer: Medicare HMO | Admitting: Gynecologic Oncology

## 2022-03-18 VITALS — BP 155/80 | HR 80 | Temp 98.6°F | Resp 16 | Ht 63.0 in | Wt 140.0 lb

## 2022-03-18 DIAGNOSIS — Z79891 Long term (current) use of opiate analgesic: Secondary | ICD-10-CM | POA: Diagnosis not present

## 2022-03-18 DIAGNOSIS — F411 Generalized anxiety disorder: Secondary | ICD-10-CM | POA: Insufficient documentation

## 2022-03-18 DIAGNOSIS — M779 Enthesopathy, unspecified: Secondary | ICD-10-CM | POA: Insufficient documentation

## 2022-03-18 DIAGNOSIS — R270 Ataxia, unspecified: Secondary | ICD-10-CM | POA: Diagnosis not present

## 2022-03-18 DIAGNOSIS — R4182 Altered mental status, unspecified: Secondary | ICD-10-CM | POA: Diagnosis not present

## 2022-03-18 DIAGNOSIS — Z79899 Other long term (current) drug therapy: Secondary | ICD-10-CM | POA: Diagnosis not present

## 2022-03-18 DIAGNOSIS — G8929 Other chronic pain: Secondary | ICD-10-CM | POA: Insufficient documentation

## 2022-03-18 DIAGNOSIS — M2170 Unequal limb length (acquired), unspecified site: Secondary | ICD-10-CM | POA: Insufficient documentation

## 2022-03-18 DIAGNOSIS — R1031 Right lower quadrant pain: Secondary | ICD-10-CM | POA: Diagnosis not present

## 2022-03-18 DIAGNOSIS — R1900 Intra-abdominal and pelvic swelling, mass and lump, unspecified site: Secondary | ICD-10-CM

## 2022-03-18 DIAGNOSIS — H8110 Benign paroxysmal vertigo, unspecified ear: Secondary | ICD-10-CM | POA: Diagnosis not present

## 2022-03-18 DIAGNOSIS — I679 Cerebrovascular disease, unspecified: Secondary | ICD-10-CM

## 2022-03-18 DIAGNOSIS — I10 Essential (primary) hypertension: Secondary | ICD-10-CM | POA: Insufficient documentation

## 2022-03-18 DIAGNOSIS — M47892 Other spondylosis, cervical region: Secondary | ICD-10-CM | POA: Insufficient documentation

## 2022-03-18 DIAGNOSIS — M5430 Sciatica, unspecified side: Secondary | ICD-10-CM | POA: Insufficient documentation

## 2022-03-18 DIAGNOSIS — E785 Hyperlipidemia, unspecified: Secondary | ICD-10-CM | POA: Diagnosis not present

## 2022-03-18 DIAGNOSIS — K219 Gastro-esophageal reflux disease without esophagitis: Secondary | ICD-10-CM | POA: Diagnosis not present

## 2022-03-18 DIAGNOSIS — M545 Low back pain, unspecified: Secondary | ICD-10-CM

## 2022-03-18 DIAGNOSIS — R971 Elevated cancer antigen 125 [CA 125]: Secondary | ICD-10-CM | POA: Diagnosis not present

## 2022-03-18 NOTE — Progress Notes (Signed)
GYNECOLOGIC ONCOLOGY NEW PATIENT CONSULTATION  Date of Service: 03/18/2022 Referring Provider: Victoria Branham, MD   ASSESSMENT AND PLAN: Alexandria Mclaughlin is a 82 y.o. woman with a 3.8cm complex adnexal mass and imaging findings concerning for possible carcinomatosis.  We reviewed that the exact etiology of the pelvic mass is unclear, but I do have some concern that this could represent a malignant process given the concern for possible carcinomatosis.  Will collect a CA125 and CEA today.  We reviewed that if this were to represent an ovarian cancer, ovarian cancer is typically treated with a combination of surgery and chemotherapy.  The order of these 2 treatments is different pending each clinical situation.  Given patient's significant chronic pain and other medical comorbidities, I do have some concerns about surgical debulking surgery.  However the adnexal mass is small.  Reviewed the goal of surgical debulking surgery to remove all visible disease.  Reviewed that if she has extensive evidence of carcinomatosis, or involvement with bowel that would require bowel surgery for optimal debulking, I would recommend biopsy and discontinuation of surgery for neoadjuvant chemotherapy.  Alternatively if no significant disease outside of the ovary, would continue with a robotic staging/debulking surgery.  Would like to try to avoid laparotomy for this patient.  Patient is in agreement with this and would also like to avoid bowel surgery.  If the ovarian mass represents a malignancy with no other findings of disease outside the ovary, we will perform indicated staging procedures. We discussed that these procedures may include omentectomy pelvic and/or para-aortic lymphadenectomy, peritoneal biopsies. We would also remove any tissue concerning for metastatic disease.   Patient was consented for: Robotic-assisted bilateral salpingo-oophorectomy, possible staging/debulking possible minilaparotomy (for omentum) on  04/08/22.  The risks of surgery were discussed in detail and she understands these to including but not limited to bleeding requiring a blood transfusion, infection, injury to adjacent organs (including but not limited to the bowels, bladder, ureters, nerves, blood vessels), thromboembolic events, wound separation, hernia, possible risk of lymphedema and lymphocyst if lymphadenectomy performed, unforseen complication, possible need for re-exploration, and medical complications such as heart attack, stroke, pneumonia.  Discussed with patient that given her medical comorbidities, I am concerned that she is at increased risk of a medical complication from surgery.  Also reviewed given her history of prior hysterectomy she has increased risk of injury to adjacent organs in the setting of possible adhesive disease.  If the patient experiences any of these events, she understands that her hospitalization or recovery may be prolonged and that she may need to take additional medications for a prolonged period. The patient will receive DVT and antibiotic prophylaxis as indicated. She voiced a clear understanding. She had the opportunity to ask questions and verbal informed consent was obtained today. She wishes to proceed.  Given her chronic pain and several medications managed by Eunice Thomas, NP, I will reach out to see if she is able to assist in management of patient's acute on chronic pain as well as perioperative pain management.  We will also contact patient's PCP for preoperative clearance.  All preoperative instructions were reviewed. Postoperative expectations were also reviewed.   A copy of this note was sent to the patient's referring provider.  Aishah Teffeteller, MD Gynecologic Oncology   Medical Decision Making I personally spent  TOTAL 60 minutes face-to-face and non-face-to-face in the care of this patient, which includes all pre, intra, and post visit time on the date of  service.   ------------    CC: Pelvic mass, pain  HISTORY OF PRESENT ILLNESS:  Alexandria Mclaughlin is a 82 y.o. woman who is seen in consultation at the request of Victoria Branham, MD for evaluation of RLQ pain and new adnexal mass.  Pt presented to the ED yesterday for a a 4-5 day history of RLQ abdominal pain that was described as stabbing in nature. It is made worse by movement and palpation. She has a history of chronic pain due to chronic back pain for which she is on fentanyl patches, percocet, trazodone and gabapentin. These medications are managed by Eunice Thomas, NP with PM&R (last seen on 02/25/22).   In the ED she underwent a CT abdomen/pelvis which showed a 3.8cm right adnexal mass and associated soft tissue thickening along the peritoneal reflections and greater omentum, concerning for peritoneal carcinomatosis. No lymphadenopathy.   Today, patient presents alone.  She reports that her pain persists and is predominantly in the right lower quadrant.  She additionally notes a 5 pound weight loss in the past month.  She reports a history of constipation that has not significantly changed.  She otherwise denies bloating, early satiety, change in bowel or bladder habits.  She lives with her son.  She reports that she is able to walk around at home without assistance but when she walks for further distances she uses a [place].  She has a history of ataxia for which she reports she underwent physical therapy which finished a couple of months ago with improvement.  Her primary care physician is Dr. Ramachandran with [facility].    PAST MEDICAL HISTORY: Past Medical History:  Diagnosis Date   Abnormality of gait 02/07/2015   Acquired hallux rigidus of left foot 12/17/2020   Acquired unequal leg length on left 08/08/2011   AMS (altered mental status) 12/28/2020   Biceps tendonitis on left 08/18/2018   BPPV (benign paroxysmal positional vertigo) 02/07/2015   Bunion 12/17/2020    Cerebrovascular disease    Cervical facet syndrome    Disorders of sacrum    Enthesopathy of hip region    Essential hypertension 02/07/2015   Generalized anxiety disorder    GERD (gastroesophageal reflux disease)    Headache    History of kidney stones    HLD (hyperlipidemia) 02/07/2015   Low back pain 02/07/2015   Lumbar facet arthropathy 07/05/2014   Lumbar post-laminectomy syndrome 06/11/2011   Lumbar radicular pain 07/29/2016   Lumbar radiculopathy 04/26/2018   Major depressive disorder    Mild cognitive impairment of uncertain or unknown etiology 01/24/2022   Multiple lacunar infarcts    MRI - bilateral basal ganglia and left thalamus   Myoclonus 09/19/2015   Right leg weakness    Rotator cuff tendonitis, left 06/08/2018   Sacroiliac joint dysfunction 06/11/2011   Sciatic neuritis    Subcortical infarction    2016 MRI - small chronic subcortical infarct with associated chronic hemosiderin deposition within the right superior frontal gyrus.   Synovitis and tenosynovitis    Therapeutic opioid induced constipation 07/25/2015   Vision abnormalities     PAST SURGICAL HISTORY: Past Surgical History:  Procedure Laterality Date   ABDOMINAL HYSTERECTOMY     Due to fibroids   BACK SURGERY     2011   BLADDER SURGERY  1985   CESAREAN SECTION     x2   RETINAL DETACHMENT SURGERY  10/2011   TRANSFORAMINAL LUMBAR INTERBODY FUSION W/ MIS 1 LEVEL Right 04/26/2018   Procedure: Right Lumbar four-five Minimally invasive Transforaminal lumbar   interbody fusion;  Surgeon: Ostergard, Thomas A, MD;  Location: [facility] OR;  Service: Neurosurgery;  Laterality: Right;    OB/GYN HISTORY: OB History  Gravida Para Term Preterm AB Living  2 2 2     2  SAB IAB Ectopic Multiple Live Births          2    # Outcome Date GA Lbr Len/2nd Weight Sex Delivery Anes PTL Lv  2 Term      CS-Unspec   LIV  1 Term      CS-Unspec   LIV      Age at menarche: 13 Age at menopause: 5 Hx of HRT: No Hx of STI:  No Last pap: She is unsure but may be 2005 History of abnormal pap smears: No  SCREENING STUDIES:  Last mammogram: 2012 Last colonoscopy: Believes around 2010  MEDICATIONS:  Current Outpatient Medications:    AREXVY 120 MCG/0.5ML injection, , Disp: , Rfl:    aspirin-acetaminophen-caffeine (EXCEDRIN MIGRAINE) 250-250-65 MG tablet, Take 1 tablet by mouth every 6 (six) hours as needed for headache or migraine., Disp: , Rfl:    Calcium Polycarbophil (FIBER-CAPS PO), , Disp: , Rfl:    clonazePAM (KLONOPIN) 0.5 MG tablet, TAKE 1 TABLET (0.5 MG TOTAL) BY MOUTH AT BEDTIME AS NEEDED FOR ANXIETY (SLEEP)., Disp: 30 tablet, Rfl: 2   cyanocobalamin 1000 MCG tablet, 1 tablet, Disp: , Rfl:    DULoxetine (CYMBALTA) 30 MG capsule, TAKE 1 CAPSULE BY MOUTH EVERY DAY, Disp: 90 capsule, Rfl: 2   fentaNYL (DURAGESIC) 25 MCG/HR, Place 1 patch onto the skin every 3 (three) days., Disp: 10 patch, Rfl: 0   gabapentin (NEURONTIN) 600 MG tablet, TAKE 1 TABLET BY MOUTH FOUR TIMES A DAY, Disp: 360 tablet, Rfl: 1   irbesartan (AVAPRO) 150 MG tablet, Take 150 mg by mouth daily., Disp: , Rfl:    Melatonin 5 MG CAPS, Take 5 mg by mouth at bedtime as needed (sleep)., Disp: , Rfl:    methimazole (TAPAZOLE) 5 MG tablet, Take 5 mg by mouth 3 (three) times daily., Disp: , Rfl:    ondansetron (ZOFRAN) 4 MG tablet, Take 4 mg by mouth 3 (three) times daily as needed., Disp: , Rfl:    oxyCODONE-acetaminophen (PERCOCET) 10-325 MG tablet, Take 1 tablet by mouth every 6 (six) hours as needed for pain. Do Not Fill Before 03/07/2022, Disp: 110 tablet, Rfl: 0   rosuvastatin (CRESTOR) 20 MG tablet, Take 20 mg by mouth at bedtime., Disp: , Rfl:    traZODone (DESYREL) 100 MG tablet, TAKE 1 TABLET BY MOUTH EVERYDAY AT BEDTIME, Disp: 90 tablet, Rfl: 2  ALLERGIES: No Known Allergies  FAMILY HISTORY: Family History  Problem Relation Age of Onset   Diabetes Mother    Heart disease Mother    Stroke Mother    Heart disease Father     Heart disease Brother    Colon cancer Neg Hx    Breast cancer Neg Hx    Ovarian cancer Neg Hx    Endometrial cancer Neg Hx    Pancreatic cancer Neg Hx    Prostate cancer Neg Hx     SOCIAL HISTORY: Social History   Socioeconomic History   Marital status: Widowed    Spouse name: Not on file   Number of children: Not on file   Years of education: 14   Highest education level: Associate degree: academic program  Occupational History   Occupation: Retired  Tobacco Use   Smoking status: Former      Packs/day: 0.50    Years: 5.00    Total pack years: 2.50    Types: Cigarettes    Quit date: 03/15/1968    Years since quitting: 54.0   Smokeless tobacco: Never  Vaping Use   Vaping Use: Never used  Substance and Sexual Activity   Alcohol use: Not Currently    Alcohol/week: 1.0 standard drink of alcohol    Types: 1 Glasses of wine per week    Comment: occasional glass of wine   Drug use: No   Sexual activity: Not Currently  Other Topics Concern   Not on file  Social History Narrative   Right handed   Drinks caffeine   Condo two story with elevator   Social Determinants of Health   Financial Resource Strain: Not on file  Food Insecurity: Not on file  Transportation Needs: Not on file  Physical Activity: Not on file  Stress: Not on file  Social Connections: Not on file  Intimate Partner Violence: Not on file    REVIEW OF SYSTEMS: New patient intake form was reviewed.  Complete 10-system review is negative except for the following: Constipation, anxiety, mouth sores, abdominal pain  PHYSICAL EXAM: BP (!) 155/80   Pulse 80   Temp 98.6 F (37 C)   Resp 16   Ht 5' 3" (1.6 m)   Wt 140 lb (63.5 kg)   SpO2 100%   BMI 24.80 kg/m  Constitutional: No acute distress. Neuro/Psych: Alert, oriented.  Head and Neck: Normocephalic, atraumatic. Neck symmetric without masses. Sclera anicteric.  Respiratory: Normal work of breathing. Clear to auscultation  bilaterally. Cardiovascular: Regular rate and rhythm, no murmurs, rubs, or gallops. Abdomen: Normoactive bowel sounds. Soft, non-distended, mild diffuse TTP with moderate TTP in the RLQ. Voluntary guarding. No masses or hepatosplenomegaly appreciated. No evidence of hernia. No palpable fluid wave. Well healing infraumbilical midline incision. Extremities: Grossly normal range of motion. Warm, well perfused. Unsteadiness to gait but able to ambulate without assistance Skin: No rashes or lesions. Lymphatic: No cervical, supraclavicular, or inguinal adenopathy. Genitourinary: External genitalia without lesions. Urethral meatus without lesions or prolapse. On speculum exam, vagina without lesions. Bimanual exam reveals cervix and uterus surgically absent. Pelvic mass unable to be palpated but exam limited by pt discomfort to deep palpation. Rectovaginal exam confirms the above findings and reveals normal sphincter tone and no masses or nodularity. Exam chaperoned by Kimberly Jordan, CMA   LABORATORY AND RADIOLOGIC DATA: Outside medical records were reviewed to synthesize the above history, along with the history and physical obtained during the visit.  Outside laboratory and imaging reports were reviewed, with pertinent results below.  I personally reviewed the outside images.  WBC  Date Value Ref Range Status  03/17/2022 7.9 4.0 - 10.5 K/uL Final   Hemoglobin  Date Value Ref Range Status  03/17/2022 15.3 (H) 12.0 - 15.0 g/dL Final  09/19/2015 14.2 11.1 - 15.9 g/dL Final   HCT  Date Value Ref Range Status  03/17/2022 45.0 36.0 - 46.0 % Final   Hematocrit  Date Value Ref Range Status  09/19/2015 40.9 34.0 - 46.6 % Final   Platelets  Date Value Ref Range Status  03/17/2022 237 150 - 400 K/uL Final  09/19/2015 204 150 - 379 x10E3/uL Final   Magnesium  Date Value Ref Range Status  12/30/2020 1.9 1.7 - 2.4 mg/dL Final    Comment:    Performed at  [facility], [address]  , [place] [address]   Creatinine,   Ser  Date Value Ref Range Status  03/17/2022 0.40 (L) 0.44 - 1.00 mg/dL Final   AST  Date Value Ref Range Status  03/17/2022 20 15 - 41 U/L Final   ALT  Date Value Ref Range Status  03/17/2022 11 0 - 44 U/L Final    CT ABDOMEN PELVIS W CONTRAST 03/17/2022  Narrative CLINICAL DATA:  Right lower quadrant abdominal pain.  EXAM: CT ABDOMEN AND PELVIS WITH CONTRAST  TECHNIQUE: Multidetector CT imaging of the abdomen and pelvis was performed using the standard protocol following bolus administration of intravenous contrast.  RADIATION DOSE REDUCTION: This exam was performed according to the departmental dose-optimization program which includes automated exposure control, adjustment of the mA and/or kV according to patient size and/or use of iterative reconstruction technique.  CONTRAST:  100mL OMNIPAQUE IOHEXOL 300 MG/ML  SOLN  COMPARISON:  CT abdomen/pelvis 05/10/2014.  FINDINGS: Lower chest: Unchanged 5 mm nodule in the right lower lobe. No consolidation. No pleural or pericardial effusion. Coronary artery calcifications.  Hepatobiliary: No focal liver abnormality is seen. No gallstones, gallbladder wall thickening, or biliary dilatation.  Pancreas: Unremarkable. No pancreatic ductal dilatation or surrounding inflammatory changes.  Spleen: Normal.  Adrenals/Urinary Tract: Adrenal glands are unremarkable. Kidneys are normal, without renal calculi, focal lesion, or hydronephrosis. Bladder is unremarkable.  Stomach/Bowel: Small hiatal hernia. Stomach is otherwise unremarkable. Normal duodenum. No dilated loops of small bowel. Appendix is not visualized. Soft tissue density along the anterior and right lateral aspects of the cecum are highly suspicious for peritoneal carcinomatosis along the omentum in the right paracolic recess.  Vascular/Lymphatic: Aortic atherosclerosis. No enlarged abdominal or pelvic lymph  nodes.  Reproductive: Uterus is not visualized. A heterogeneous right adnexal mass measuring 3.8 x 3.0 cm on image 69 of series 2. Associated soft tissue thickening along the peritoneal reflections and greater omentum (for example on coronal image 38 of series 6 and axial image 55 of series 2) is highly suspicious for peritoneal carcinomatosis.  Other: No abdominal wall hernia. No abdominopelvic ascites.  Musculoskeletal: Unchanged L1 compression fracture seen on prior lumbar spine MRI dated 12/29/2020. Postoperative changes of prior L4-5 interbody and posterior spinal fusion with acquired fusion of the L5-S1 segment. No suspicious bone lesions.  IMPRESSION: 1. Heterogeneous right adnexal mass measuring 3.8 x 3.0 cm with associated soft tissue thickening along the peritoneal reflections and greater omentum. Findings are highly suspicious for ovarian neoplasm with peritoneal carcinomatosis. 2. No evidence of bowel obstruction. 3. Coronary artery calcifications.  Aortic Atherosclerosis (ICD10-I70.0).   Electronically Signed By: Walter  Wiggins M.D. On: 03/17/2022 14:39  

## 2022-03-18 NOTE — H&P (View-Only) (Signed)
GYNECOLOGIC ONCOLOGY NEW PATIENT CONSULTATION  Date of Service: 03/18/2022 Referring Provider: Georgina Snell, MD   ASSESSMENT AND PLAN: Alexandria Mclaughlin is a 82 y.o. woman with a 3.8cm complex adnexal mass and imaging findings concerning for possible carcinomatosis.  We reviewed that the exact etiology of the pelvic mass is unclear, but I do have some concern that this could represent a malignant process given the concern for possible carcinomatosis.  Will collect a CA125 and CEA today.  We reviewed that if this were to represent an ovarian cancer, ovarian cancer is typically treated with a combination of surgery and chemotherapy.  The order of these 2 treatments is different pending each clinical situation.  Given patient's significant chronic pain and other medical comorbidities, I do have some concerns about surgical debulking surgery.  However the adnexal mass is small.  Reviewed the goal of surgical debulking surgery to remove all visible disease.  Reviewed that if she has extensive evidence of carcinomatosis, or involvement with bowel that would require bowel surgery for optimal debulking, I would recommend biopsy and discontinuation of surgery for neoadjuvant chemotherapy.  Alternatively if no significant disease outside of the ovary, would continue with a robotic staging/debulking surgery.  Would like to try to avoid laparotomy for this patient.  Patient is in agreement with this and would also like to avoid bowel surgery.  If the ovarian mass represents a malignancy with no other findings of disease outside the ovary, we will perform indicated staging procedures. We discussed that these procedures may include omentectomy pelvic and/or para-aortic lymphadenectomy, peritoneal biopsies. We would also remove any tissue concerning for metastatic disease.   Patient was consented for: Robotic-assisted bilateral salpingo-oophorectomy, possible staging/debulking possible minilaparotomy (for omentum) on  04/08/22.  The risks of surgery were discussed in detail and she understands these to including but not limited to bleeding requiring a blood transfusion, infection, injury to adjacent organs (including but not limited to the bowels, bladder, ureters, nerves, blood vessels), thromboembolic events, wound separation, hernia, possible risk of lymphedema and lymphocyst if lymphadenectomy performed, unforseen complication, possible need for re-exploration, and medical complications such as heart attack, stroke, pneumonia.  Discussed with patient that given her medical comorbidities, I am concerned that she is at increased risk of a medical complication from surgery.  Also reviewed given her history of prior hysterectomy she has increased risk of injury to adjacent organs in the setting of possible adhesive disease.  If the patient experiences any of these events, she understands that her hospitalization or recovery may be prolonged and that she may need to take additional medications for a prolonged period. The patient will receive DVT and antibiotic prophylaxis as indicated. She voiced a clear understanding. She had the opportunity to ask questions and verbal informed consent was obtained today. She wishes to proceed.  Given her chronic pain and several medications managed by Danella Sensing, NP, I will reach out to see if she is able to assist in management of patient's acute on chronic pain as well as perioperative pain management.  We will also contact patient's PCP for preoperative clearance.  All preoperative instructions were reviewed. Postoperative expectations were also reviewed.   A copy of this note was sent to the patient's referring provider.  Bernadene Bell, MD Gynecologic Oncology   Medical Decision Making I personally spent  TOTAL 60 minutes face-to-face and non-face-to-face in the care of this patient, which includes all pre, intra, and post visit time on the date of  service.   ------------  CC: Pelvic mass, pain  HISTORY OF PRESENT ILLNESS:  Alexandria Mclaughlin is a 82 y.o. woman who is seen in consultation at the request of Georgina Snell, MD for evaluation of RLQ pain and new adnexal mass.  Pt presented to the ED yesterday for a a 4-5 day history of RLQ abdominal pain that was described as stabbing in nature. It is made worse by movement and palpation. She has a history of chronic pain due to chronic back pain for which she is on fentanyl patches, percocet, trazodone and gabapentin. These medications are managed by Danella Sensing, NP with [facility] (last seen on 02/25/22).   In the ED she underwent a CT abdomen/pelvis which showed a 3.8cm right adnexal mass and associated soft tissue thickening along the peritoneal reflections and greater omentum, concerning for peritoneal carcinomatosis. No lymphadenopathy.   Today, patient presents alone.  She reports that her pain persists and is predominantly in the right lower quadrant.  She additionally notes a 5 pound weight loss in the past month.  She reports a history of constipation that has not significantly changed.  She otherwise denies bloating, early satiety, change in bowel or bladder habits.  She lives with her son.  She reports that she is able to walk around at home without assistance but when she walks for further distances she uses a walker.  She has a history of ataxia for which she reports she underwent physical therapy which finished a couple of months ago with improvement.  Her primary care physician is Dr. Ashby Dawes with [facility].    PAST MEDICAL HISTORY: Past Medical History:  Diagnosis Date   Abnormality of gait 02/07/2015   Acquired hallux rigidus of left foot 12/17/2020   Acquired unequal leg length on left 08/08/2011   AMS (altered mental status) 12/28/2020   Biceps tendonitis on left 08/18/2018   BPPV (benign paroxysmal positional vertigo) 02/07/2015   Bunion 12/17/2020    Cerebrovascular disease    Cervical facet syndrome    Disorders of sacrum    Enthesopathy of hip region    Essential hypertension 02/07/2015   Generalized anxiety disorder    GERD (gastroesophageal reflux disease)    Headache    History of kidney stones    HLD (hyperlipidemia) 02/07/2015   Low back pain 02/07/2015   Lumbar facet arthropathy 07/05/2014   Lumbar post-laminectomy syndrome 06/11/2011   Lumbar radicular pain 07/29/2016   Lumbar radiculopathy 04/26/2018   Major depressive disorder    Mild cognitive impairment of uncertain or unknown etiology 01/24/2022   Multiple lacunar infarcts    MRI - bilateral basal ganglia and left thalamus   Myoclonus 09/19/2015   Right leg weakness    Rotator cuff tendonitis, left 06/08/2018   Sacroiliac joint dysfunction 06/11/2011   Sciatic neuritis    Subcortical infarction    2016 MRI - small chronic subcortical infarct with associated chronic hemosiderin deposition within the right superior frontal gyrus.   Synovitis and tenosynovitis    Therapeutic opioid induced constipation 07/25/2015   Vision abnormalities     PAST SURGICAL HISTORY: Past Surgical History:  Procedure Laterality Date   ABDOMINAL HYSTERECTOMY     Due to fibroids   BACK SURGERY     2011   [place]     x2   RETINAL DETACHMENT SURGERY  10/2011   TRANSFORAMINAL LUMBAR INTERBODY FUSION W/ MIS 1 LEVEL Right 04/26/2018   Procedure: Right Lumbar four-five Minimally invasive Transforaminal lumbar  interbody fusion;  Surgeon: Judith Part, MD;  Location: [facility];  Service: Neurosurgery;  Laterality: Right;    OB/GYN HISTORY: OB History  Gravida Para Term Preterm AB Living  2 2 2     2  $ SAB IAB Ectopic Multiple Live Births          2    # Outcome Date GA Lbr Len/2nd Weight Sex Delivery Anes PTL Lv  2 Term      CS-Unspec   LIV  1 Term      CS-Unspec   LIV      Age at menarche: 73 Age at menopause: 5 Hx of HRT: No Hx of STI:  No Last pap: She is unsure but may be 2005 History of abnormal pap smears: No  SCREENING STUDIES:  Last mammogram: 2012 Last colonoscopy: Believes around 2010  MEDICATIONS:  Current Outpatient Medications:    AREXVY 120 MCG/0.5ML injection, , Disp: , Rfl:    aspirin-acetaminophen-caffeine (EXCEDRIN MIGRAINE) 250-250-65 MG tablet, Take 1 tablet by mouth every 6 (six) hours as needed for headache or migraine., Disp: , Rfl:    Calcium Polycarbophil (FIBER-CAPS PO), , Disp: , Rfl:    clonazePAM (KLONOPIN) 0.5 MG tablet, TAKE 1 TABLET (0.5 MG TOTAL) BY MOUTH AT BEDTIME AS NEEDED FOR ANXIETY (SLEEP)., Disp: 30 tablet, Rfl: 2   cyanocobalamin 1000 MCG tablet, 1 tablet, Disp: , Rfl:    DULoxetine (CYMBALTA) 30 MG capsule, TAKE 1 CAPSULE BY MOUTH EVERY DAY, Disp: 90 capsule, Rfl: 2   fentaNYL (DURAGESIC) 25 MCG/HR, Place 1 patch onto the skin every 3 (three) days., Disp: 10 patch, Rfl: 0   gabapentin (NEURONTIN) 600 MG tablet, TAKE 1 TABLET BY MOUTH FOUR TIMES A DAY, Disp: 360 tablet, Rfl: 1   irbesartan (AVAPRO) 150 MG tablet, Take 150 mg by mouth daily., Disp: , Rfl:    Melatonin 5 MG CAPS, Take 5 mg by mouth at bedtime as needed (sleep)., Disp: , Rfl:    methimazole (TAPAZOLE) 5 MG tablet, Take 5 mg by mouth 3 (three) times daily., Disp: , Rfl:    ondansetron (ZOFRAN) 4 MG tablet, Take 4 mg by mouth 3 (three) times daily as needed., Disp: , Rfl:    oxyCODONE-acetaminophen (PERCOCET) 10-325 MG tablet, Take 1 tablet by mouth every 6 (six) hours as needed for pain. Do Not Fill Before 03/07/2022, Disp: 110 tablet, Rfl: 0   rosuvastatin (CRESTOR) 20 MG tablet, Take 20 mg by mouth at bedtime., Disp: , Rfl:    traZODone (DESYREL) 100 MG tablet, TAKE 1 TABLET BY MOUTH EVERYDAY AT BEDTIME, Disp: 90 tablet, Rfl: 2  ALLERGIES: No Known Allergies  FAMILY HISTORY: Family History  Problem Relation Age of Onset   Diabetes Mother    Heart disease Mother    Stroke Mother    Heart disease Father     Heart disease Brother    Colon cancer Neg Hx    Breast cancer Neg Hx    Ovarian cancer Neg Hx    Endometrial cancer Neg Hx    Pancreatic cancer Neg Hx    Prostate cancer Neg Hx     SOCIAL HISTORY: Social History   Socioeconomic History   Marital status: Widowed    Spouse name: Not on file   Number of children: Not on file   Years of education: 14   Highest education level: Associate degree: academic program  Occupational History   Occupation: Retired  Tobacco Use   Smoking status: Former  Packs/day: 0.50    Years: 5.00    Total pack years: 2.50    Types: Cigarettes    Quit date: 03/15/1968    Years since quitting: 54.0   Smokeless tobacco: Never  Vaping Use   Vaping Use: Never used  Substance and Sexual Activity   Alcohol use: Not Currently    Alcohol/week: 1.0 standard drink of alcohol    Types: 1 Glasses of wine per week    Comment: occasional glass of wine   Drug use: No   Sexual activity: Not Currently  Other Topics Concern   Not on file  Social History Narrative   Right handed   Drinks caffeine   Condo two story with Media pl[employer]   Social Determinants of Health   Financial Resource Strain: Not on file  Food Insecurity: Not on file  Transportation Needs: Not on file  Physical Activity: Not on file  Stress: Not on file  Social Connections: Not on file  Intimate Partner Violence: Not on file    REVIEW OF SYSTEMS: New patient intake form was reviewed.  Complete 10-system review is negative except for the following: Constipation, anxiety, mouth sores, abdominal pain  PHYSICAL EXAM: BP (!) 155/80   Pulse 80   Temp 98.6 F (37 C)   Resp 16   Ht 5' 3"$  (1.6 m)   Wt 140 lb (63.5 kg)   SpO2 100%   BMI 24.80 kg/m  Constitutional: No acute distress. Neuro/Psych: Alert, oriented.  Head and Neck: Normocephalic, atraumatic. Neck symmetric without masses. Sclera anicteric.  Respiratory: Normal work of breathing. Clear to auscultation  bilaterally. Cardiovascular: Regular rate and rhythm, no murmurs, rubs, or gallops. Abdomen: Normoactive bowel sounds. Soft, non-distended, mild diffuse TTP with moderate TTP in the RLQ. Voluntary guarding. No masses or hepatosplenomegaly appreciated. No evidence of hernia. No palpable fluid wave. Well healing infraumbilical midline incision. Extremities: Grossly normal range of motion. Warm, well perfused. Unsteadiness to gait but able to ambulate without assistance Skin: No rashes or lesions. Lymphatic: No cervical, supraclavicular, or inguinal adenopathy. Genitourinary: External genitalia without lesions. Urethral meatus without lesions or prolapse. On speculum exam, vagina without lesions. Bimanual exam reveals cervix and uterus surgically absent. Pelvic mass unable to be palpated but exam limited by pt discomfort to deep palpation. Rectovaginal exam confirms the above findings and reveals normal sphincter tone and no masses or nodularity. Exam chaperoned by Kimberly Martinique, CMA   LABORATORY AND RADIOLOGIC DATA: Outside medical records were reviewed to synthesize the above history, along with the history and physical obtained during the visit.  Outside laboratory and imaging reports were reviewed, with pertinent results below.  I personally reviewed the outside images.  WBC  Date Value Ref Range Status  03/17/2022 7.9 4.0 - 10.5 K/uL Final   Hemoglobin  Date Value Ref Range Status  03/17/2022 15.3 (H) 12.0 - 15.0 g/dL Final  09/19/2015 14.2 11.1 - 15.9 g/dL Final   HCT  Date Value Ref Range Status  03/17/2022 45.0 36.0 - 46.0 % Final   Hematocrit  Date Value Ref Range Status  09/19/2015 40.9 34.0 - 46.6 % Final   Platelets  Date Value Ref Range Status  03/17/2022 237 150 - 400 K/uL Final  09/19/2015 204 150 - 379 x10E3/uL Final   Magnesium  Date Value Ref Range Status  12/30/2020 1.9 1.7 - 2.4 mg/dL Final    Comment:    Performed at [facility], [place] [address],  [place], [place] [address]   Creatinine,  Ser  Date Value Ref Range Status  03/17/2022 0.40 (L) 0.44 - 1.00 mg/dL Final   AST  Date Value Ref Range Status  03/17/2022 20 15 - 41 U/L Final   ALT  Date Value Ref Range Status  03/17/2022 11 0 - 44 U/L Final    CT ABDOMEN PELVIS W CONTRAST 03/17/2022  Narrative CLINICAL DATA:  Right lower quadrant abdominal pain.  EXAM: CT ABDOMEN AND PELVIS WITH CONTRAST  TECHNIQUE: Multidetector CT imaging of the abdomen and pelvis was performed using the standard protocol following bolus administration of intravenous contrast.  RADIATION DOSE REDUCTION: This exam was performed according to the departmental dose-optimization program which includes automated exposure control, adjustment of the mA and/or kV according to patient size and/or use of iterative reconstruction technique.  CONTRAST:  182m OMNIPAQUE IOHEXOL 300 MG/ML  SOLN  COMPARISON:  CT abdomen/pelvis 05/10/2014.  FINDINGS: Lower chest: Unchanged 5 mm nodule in the right lower lobe. No consolidation. No pleural or pericardial effusion. Coronary artery calcifications.  Hepatobiliary: No focal liver abnormality is seen. No gallstones, gallbladder wall thickening, or biliary dilatation.  Pancreas: Unremarkable. No pancreatic ductal dilatation or surrounding inflammatory changes.  Spleen: Normal.  Adrenals/Urinary Tract: Adrenal glands are unremarkable. Kidneys are normal, without renal calculi, focal lesion, or hydronephrosis. Bladder is unremarkable.  Stomach/Bowel: Small hiatal hernia. Stomach is otherwise unremarkable. Normal duodenum. No dilated loops of small bowel. Appendix is not visualized. Soft tissue density along the anterior and right lateral aspects of the cecum are highly suspicious for peritoneal carcinomatosis along the omentum in the right paracolic recess.  Vascular/Lymphatic: Aortic atherosclerosis. No enlarged abdominal or pelvic lymph  nodes.  Reproductive: Uterus is not visualized. A heterogeneous right adnexal mass measuring 3.8 x 3.0 cm on image 69 of series 2. Associated soft tissue thickening along the peritoneal reflections and greater omentum (for example on coronal image 38 of series 6 and axial image 55 of series 2) is highly suspicious for peritoneal carcinomatosis.  Other: No abdominal wall hernia. No abdominopelvic ascites.  Musculoskeletal: Unchanged L1 compression fracture seen on prior lumbar spine MRI dated 12/29/2020. Postoperative changes of prior L4-5 interbody and posterior spinal fusion with acquired fusion of the L5-S1 segment. No suspicious bone lesions.  IMPRESSION: 1. Heterogeneous right adnexal mass measuring 3.8 x 3.0 cm with associated soft tissue thickening along the peritoneal reflections and greater omentum. Findings are highly suspicious for ovarian neoplasm with peritoneal carcinomatosis. 2. No evidence of bowel obstruction. 3. Coronary artery calcifications.  Aortic Atherosclerosis (ICD10-I70.0).   Electronically Signed By: WEmmit AlexandersM.D. On: 03/17/2022 14:39

## 2022-03-18 NOTE — Telephone Encounter (Signed)
Spoke with Alexandria Mclaughlin regarding her referral to GYN oncology. She has an appointment scheduled with Dr. Ernestina Patches on 03/18/22 at 2:30. Patient agrees to date and time. She has been provided with office address and location. She is also aware of our mask and visitor policy. Patient verbalized understanding and will call with any questions.

## 2022-03-18 NOTE — Patient Instructions (Signed)
We will need to obtain clearance from your primary care provider for surgery.  Preparing for your Surgery  Plan for surgery on April 08, 2022 with Dr. Bernadene Bell at [facility] will be scheduled for diagnostic laparoscopy (looking into the abdomen with a camera through small incisions), robotic assisted laparoscopic bilateral salpingo-oophorectomy (removal of both tubes and ovaries), debulking, if the surgery needs to performed through an open incision, the plan will be to take biopsies then complete surgery without making a large incision.   Pre-operative Testing -You will receive a phone call from presurgical testing at [facility] to arrange for a pre-operative appointment and lab work.  -Bring your insurance card, copy of an advanced directive if applicable, medication list  -At that visit, you will be asked to sign a consent for a possible blood transfusion in case a transfusion becomes necessary during surgery.  The need for a blood transfusion is rare but having consent is a necessary part of your care.     -You should not be taking blood thinners or aspirin at least ten days prior to surgery unless instructed by your surgeon.  -Do not take supplements such as fish oil (omega 3), red yeast rice, turmeric before your surgery. You want to avoid medications with aspirin in them including headache powders such as BC or Goody's), Excedrin migraine.  Day Before Surgery at [place] will be asked to take in a light diet the day before surgery. You will be advised you can have clear liquids up until 3 hours before your surgery.    Eat a light diet the day before surgery.  Examples including soups, broths, toast, yogurt, mashed potatoes.  AVOID GAS PRODUCING FOODS. Things to avoid include carbonated beverages (fizzy beverages, sodas), raw fruits and raw vegetables (uncooked), or beans.   If your bowels are filled with gas, your surgeon will have difficulty  visualizing your pelvic organs which increases your surgical risks.  Your role in recovery Your role is to become active as soon as directed by your doctor, while still giving yourself time to heal.  Rest when you feel tired. You will be asked to do the following in order to speed your recovery:  - Cough and breathe deeply. This helps to clear and expand your lungs and can prevent pneumonia after surgery.  - [place]. Do mild physical activity. Walking or moving your legs help your circulation and body functions return to normal. Do not try to get up or walk alone the first time after surgery.   -If you develop swelling on one leg or the other, pain in the back of your leg, redness/warmth in one of your legs, please call the office or go to the Emergency Room to have a doppler to rule out a blood clot. For shortness of breath, chest pain-seek care in the Emergency Room as soon as possible. - Actively manage your pain. Managing your pain lets you move in comfort. We will ask you to rate your pain on a scale of zero to 10. It is your responsibility to tell your doctor or nurse where and how much you hurt so your pain can be treated.  Special Considerations -If you are diabetic, you may be placed on insulin after surgery to have closer control over your blood sugars to promote healing and recovery.  This does not mean that you will be discharged on insulin.  If applicable, your oral antidiabetics will be resumed  when you are tolerating a solid diet.  -Your final pathology results from surgery should be available around one week after surgery and the results will be relayed to you when available.  -Dr. Lahoma Crocker is the surgeon that assists your GYN Oncologist with surgery.  If you end up staying the night, the next day after your surgery you will either see Dr. Berline Lopes, Dr. Ernestina Patches, or Dr. Lahoma Crocker.  -FMLA forms can be faxed to [phone] and please allow 5-7  business days for completion.  Pain Management After Surgery -We will reach out to your pain management doctors about your pain medication needs after surgery.   Bowel Regimen -You have been prescribed Sennakot-S to take nightly to prevent constipation especially if you are taking the narcotic pain medication intermittently.  It is important to prevent constipation and drink adequate amounts of liquids. You can stop taking this medication when you are not taking pain medication and you are back on your normal bowel routine.  Risks of Surgery Risks of surgery are low but include bleeding, infection, damage to surrounding structures, re-operation, blood clots, and very rarely death.   Blood Transfusion Information (For the consent to be signed before surgery)  We will be checking your blood type before surgery so in case of emergencies, we will know what type of blood you would need.                                            WHAT IS A BLOOD TRANSFUSION?  A transfusion is the replacement of blood or some of its parts. Blood is made up of multiple cells which provide different functions. Red blood cells carry oxygen and are used for blood loss replacement. White blood cells fight against infection. Platelets control bleeding. Plasma helps clot blood. Other blood products are available for specialized needs, such as hemophilia or other clotting disorders. BEFORE THE TRANSFUSION  Who gives blood for transfusions?  You may be able to donate blood to be used at a later date on yourself (autologous donation). Relatives can be asked to donate blood. This is generally not any safer than if you have received blood from a stranger. The same precautions are taken to ensure safety when a relative's blood is donated. Healthy volunteers who are fully evaluated to make sure their blood is safe. This is blood bank blood. Transfusion therapy is the safest it has ever been in the practice of medicine.  Before blood is taken from a donor, a complete history is taken to make sure that person has no history of diseases nor engages in risky social behavior (examples are intravenous drug use or sexual activity with multiple partners). The donor's travel history is screened to minimize risk of transmitting infections, such as malaria. The donated blood is tested for signs of infectious diseases, such as HIV and hepatitis. The blood is then tested to be sure it is compatible with you in order to minimize the chance of a transfusion reaction. If you or a relative donates blood, this is often done in anticipation of surgery and is not appropriate for emergency situations. It takes many days to process the donated blood. RISKS AND COMPLICATIONS Although transfusion therapy is very safe and saves many lives, the main dangers of transfusion include:  Getting an infectious disease. Developing a transfusion reaction. This is an allergic reaction to something in  the blood you were given. Every precaution is taken to prevent this. The decision to have a blood transfusion has been considered carefully by your caregiver before blood is given. Blood is not given unless the benefits outweigh the risks.  AFTER SURGERY INSTRUCTIONS  Return to work: 4-6 weeks if applicable  Activity: 1. Be up and out of the bed during the day.  Take a nap if needed.  You may walk up steps but be careful and use the hand rail.  Stair climbing will tire you more than you think, you may need to stop part way and rest.   2. No lifting or straining for 6 weeks over 10 pounds. No pushing, pulling, straining for 6 weeks.  3. No driving for around 1 week(s).  Do not drive if you are taking narcotic pain medicine and make sure that your reaction time has returned.   4. You can shower as soon as the next day after surgery. Shower daily.  Use your regular soap and water (not directly on the incision) and pat your incision(s) dry afterwards; don't  rub.  No tub baths or submerging your body in water until cleared by your surgeon. If you have the soap that was given to you by pre-surgical testing that was used before surgery, you do not need to use it afterwards because this can irritate your incisions.   5. No sexual activity and nothing in the vagina for 4 weeks.  6. You may experience a small amount of clear drainage from your incisions, which is normal.  If the drainage persists, increases, or changes color please call the office.  7. Do not use creams, lotions, or ointments such as neosporin on your incisions after surgery until advised by your surgeon because they can cause removal of the dermabond glue on your incisions.    Diet: 1. Low sodium Heart Healthy Diet is recommended but you are cleared to resume your normal (before surgery) diet after your procedure.  2. It is safe to use a laxative, such as Miralax or Colace, if you have difficulty moving your bowels. You have been prescribed Sennakot-S to take at bedtime every evening after surgery to keep bowel movements regular and to prevent constipation.    Wound Care: 1. Keep clean and dry.  Shower daily.  Reasons to call the Doctor: Fever - Oral temperature greater than 100.4 degrees Fahrenheit Foul-smelling vaginal discharge Difficulty urinating Nausea and vomiting Increased pain at the site of the incision that is unrelieved with pain medicine. Difficulty breathing with or without chest pain New calf pain especially if only on one side Sudden, continuing increased vaginal bleeding with or without clots.   Contacts: For questions or concerns you should contact:  Dr. Bernadene Bell at [place], NP at [phone]  After Hours: call [phone] and have the GYN Oncologist paged/contacted (after 5 pm or on the weekends).  Messages sent via mychart are for non-urgent matters and are not responded to after hours so for urgent needs, please call the  after hours number.

## 2022-03-19 ENCOUNTER — Telehealth: Payer: Self-pay | Admitting: *Deleted

## 2022-03-19 ENCOUNTER — Telehealth: Payer: Self-pay | Admitting: Registered Nurse

## 2022-03-19 LAB — CEA (ACCESS): CEA (CHCC): 2.4 ng/mL (ref 0.00–5.00)

## 2022-03-19 LAB — CA 125: Cancer Antigen (CA) 125: 163 U/mL — ABNORMAL HIGH (ref 0.0–38.1)

## 2022-03-19 NOTE — Telephone Encounter (Unsigned)
Return Alexandria Mclaughlin call, she reports increase intensity of abdominal pain. Reviewed Dr Ernestina Patches note from 03/18/2022, regarding her Pelvic Mass.  Her current medication is Fentanyl and Oxycodone, she reports no relief with her current medication. Her pain was controlled with the above regimen prior to the development of her abdominal pain. Dr Ernestina Patches note states he would like for our office  can assist with managing her pain. Will have to be mindful of her age and medical history, this will be discussed with [name].  Will see if [name] is in agreement with Oxycodone 10/325 5 times a day as needed for pain and Fentanyl dose will remain the same. Alexandria Mclaughlin is scheduled for surgery on 04/08/2022 for :  XI ROBOTIC ASSISTED BILATERAL SALPINGO OOPHORECTOMY Bilateral General  POSSIBLE LAPAROTOMY WITH STAGING N/A General  POSSIBLE DEBULKING    Dr Berline Lopes.   Will await [name] input and Alexandria Mclaughlin is aware of the above.

## 2022-03-19 NOTE — Telephone Encounter (Signed)
This provider spoke with [name] , we will increase Alexandria Mclaughlin Oxycodone to 5 times a day as needed for pain. Will send message to Dr Ernestina Patches regarding the above, his recommendation is for oncology to treat her pain.  Alexandria Mclaughlin understanding.

## 2022-03-19 NOTE — Telephone Encounter (Signed)
Patient needs Zella Ball to call her in reference with new diagnosis and medication increases. She states she is in a lot of pain.

## 2022-03-19 NOTE — Telephone Encounter (Signed)
Spoke with Ms. Stotz. We discussed that we had reached out to her Physical Medicine and Rehab NP regarding managing her pain. She spoke with that office today and is waiting to hear back regarding possible changes in her pain medications. She can call us with any questions or concerns regarding her upcoming surgery.

## 2022-03-19 NOTE — Progress Notes (Signed)
Patient here for new patient consultation with Dr. Ernestina Patches and for a pre-operative appointment prior to her scheduled surgery on 04/08/2022. She is scheduled for diagnostic laparoscopy, robotic assisted laparoscopic bilateral salpingo-oophorectomy, debulking, if the surgery needs to performed through an open incision, the plan will be to take biopsies then complete surgery without making a large incision. The surgery was discussed in detail.  See after visit summary for additional details.    Discussed post-op pain management in detail including the aspects of the enhanced recovery pathway. Advised we had reached out to her chronic pain provider about adjusting current regimen and pain needs post-op. Prescribed sennakot to be used after surgery and to hold if having loose stools.  Discussed bowel regimen in detail. She has medications at home for constipation.     Discussed measures to take at home to prevent DVT including frequent mobility.  Reportable signs and symptoms of DVT discussed. Post-operative instructions discussed and expectations for after surgery. Incisional care discussed as well including reportable signs and symptoms including erythema, drainage, wound separation.     10 minutes spent with the patient and preparing information.  Verbalizing understanding of material discussed. No needs or concerns voiced at the end of the visit.   Advised patient to call for any needs.    This appointment is included in the global surgical bundle as pre-operative teaching and has no charge.

## 2022-03-21 ENCOUNTER — Telehealth: Payer: Self-pay

## 2022-03-21 NOTE — Telephone Encounter (Signed)
     Patient  visit on 03/17/2022  at [facility] was for Abdominal pain, unspecified abdominal location.  Have you been able to follow up with your primary care physician? Patient followed up with her Oncologist.  The patient was or was not able to obtain any needed medicine or equipment. No medication prescribed.  Are there diet recommendations that you are having difficulty following? No  Patient expresses understanding of discharge instructions and education provided has no other needs at this time.    [place] Resource Care Guide   [email or web address]  ?? [phone]   Website: [email or web address]

## 2022-03-28 ENCOUNTER — Telehealth: Payer: Self-pay | Admitting: Registered Nurse

## 2022-03-28 MED ORDER — FENTANYL 25 MCG/HR TD PT72: 1.0000 | MEDICATED_PATCH | TRANSDERMAL | 0 refills | Status: DC

## 2022-03-28 NOTE — Telephone Encounter (Signed)
PMP was Reviewed and Fentanyl e-scribed today.  Ms. Spiewak is aware via My-Chart message.

## 2022-03-28 NOTE — Telephone Encounter (Signed)
Patient needs a refill on Fentanyl Patch.  Please call patient.

## 2022-03-30 NOTE — Patient Instructions (Signed)
SURGICAL WAITING ROOM VISITATION Patients having surgery or a procedure may have no more than 2 support people in the waiting area - these visitors may rotate in the visitor waiting room.   Due to an increase in RSV and influenza rates and associated hospitalizations, children ages 57 and under may not visit patients in [place]. If the patient needs to stay at the hospital during part of their recovery, the visitor guidelines for inpatient rooms apply.  PRE-OP VISITATION  Pre-op nurse will coordinate an appropriate time for 1 support person to accompany the patient in pre-op.  This support person may not rotate.  This visitor will be contacted when the time is appropriate for the visitor to come back in the pre-op area.  Please refer to the [facility] website for the visitor guidelines for Inpatients (after your surgery is over and you are in a regular room).  You are not required to quarantine at this time prior to your surgery. However, you must do this: Hand Hygiene often Do NOT share personal items Notify your provider if you are in close contact with someone who has COVID or you develop fever 100.4 or greater, new onset of sneezing, cough, sore throat, shortness of breath or body aches.  If you test positive for Covid or have been in contact with anyone that has tested positive in the last 10 days please notify you surgeon.    Your procedure is scheduled on:  Tuesday  April 08, 2022  Report to [facility] Main Entrance: [place] entrance where the [employer] is available.   Report to admitting at: 05:15  AM  +++++Call this number if you have any questions or problems the morning of surgery [phone]  Do not eat food after Midnight the night prior to your surgery/procedure.  After Midnight you may have the following liquids until 04:30 AM DAY OF SURGERY  Clear Liquid Diet Water Black Coffee (sugar ok, NO MILK/CREAM OR CREAMERS)  Tea (sugar ok, NO  MILK/CREAM OR CREAMERS) regular and decaf                             Plain Jell-O  with no fruit (NO RED)                                           Fruit ices (not with fruit pulp, NO RED)                                     Popsicles (NO RED)                                                                  Juice: apple, WHITE grape, WHITE cranberry Sports drinks like Gatorade or Powerade (NO RED)             FOLLOW BOWEL PREP AND ANY ADDITIONAL PRE OP INSTRUCTIONS YOU RECEIVED FROM YOUR SURGEON'S OFFICE!!!  Eat a light diet the day before surgery.  Examples including soups, broths, toast, yogurt, mashed  potatoes.  AVOID GAS PRODUCING FOODS. Things to avoid include carbonated beverages (fizzy beverages, sodas), raw fruits and raw vegetables (uncooked), or beans.      Oral Hygiene is also important to reduce your risk of infection.        Remember - BRUSH YOUR TEETH THE MORNING OF SURGERY WITH YOUR REGULAR TOOTHPASTE  Do NOT smoke after Midnight the night before surgery.  Take ONLY these medicines the morning of surgery with A SIP OF WATER: Duloxetine (Cymbalta), Gabapentin, Methimazole (Tapazole) if it is the correct day to take it.  Fentanyl Patch,  Percocet as needed for break through pain. ???                   You may not have any metal on your body including hair pins, jewelry, and body piercing  Do not wear make-up, lotions, powders, perfumes or deodorant  Do not wear nail polish including gel and S&S, artificial / acrylic nails, or any other type of covering on natural nails including finger and toenails. If you have artificial nails, gel coating, etc., that needs to be removed by a nail salon, Please have this removed prior to surgery. Not doing so may mean that your surgery could be cancelled or delayed if the Surgeon or anesthesia staff feels like they are unable to monitor you safely.   Do not shave 48 hours prior to surgery to avoid nicks in your skin which may contribute to  postoperative infections.   Contacts, Hearing Aids, dentures or bridgework may not be worn into surgery. DENTURES WILL BE REMOVED PRIOR TO SURGERY PLEASE DO NOT APPLY "Poly grip" OR ADHESIVES!!!  You may bring a small overnight bag with you on the day of surgery, only pack items that are not valuable. [facility] IS NOT RESPONSIBLE   FOR VALUABLES THAT ARE LOST OR STOLEN.   Do not bring your home medications to the hospital. The Pharmacy will dispense medications listed on your medication list to you during your admission in the Hospital.  Special Instructions: Bring a copy of your healthcare power of attorney and living will documents the day of surgery, if you wish to have them scanned into your [facility] Records- EPIC  Please read over the following fact sheets you were given: IF YOU HAVE QUESTIONS ABOUT YOUR PRE-OP INSTRUCTIONS, PLEASE CALL [phone]  ([place])   [place] - Preparing for Surgery Before surgery, you can play an important role.  Because skin is not sterile, your skin needs to be as free of germs as possible.  You can reduce the number of germs on your skin by washing with CHG (chlorahexidine gluconate) soap before surgery.  CHG is an antiseptic cleaner which kills germs and bonds with the skin to continue killing germs even after washing. Please DO NOT use if you have an allergy to CHG or antibacterial soaps.  If your skin becomes reddened/irritated stop using the CHG and inform your nurse when you arrive at Short Stay. Do not shave (including legs and underarms) for at least 48 hours prior to the first CHG shower.  You may shave your face/neck.  Please follow these instructions carefully:  1.  Shower with CHG Soap the night before surgery and the  morning of surgery.  2.  If you choose to wash your hair, wash your hair first as usual with your normal  shampoo.  3.  After you shampoo, rinse your hair and body thoroughly to remove the shampoo.  4.  Use CHG as you would any other liquid soap.  You can apply chg directly to the skin and wash.  Gently with a scrungie or clean washcloth.  5.  Apply the CHG Soap to your body ONLY FROM THE NECK DOWN.   Do not use on face/ open                           Wound or open sores. Avoid contact with eyes, ears mouth and genitals (private parts).                       Wash face,  Genitals (private parts) with your normal soap.             6.  Wash thoroughly, paying special attention to the area where your  surgery  will be performed.  7.  Thoroughly rinse your body with warm water from the neck down.  8.  DO NOT shower/wash with your normal soap after using and rinsing off the CHG Soap.            9.  Pat yourself dry with a clean towel.            10.  Wear clean pajamas.            11.  Place clean sheets on your bed the night of your first shower and do not  sleep with pets.  ON THE DAY OF SURGERY : Do not apply any lotions/deodorants the morning of surgery.  Please wear clean clothes to the hospital/surgery center.    FAILURE TO FOLLOW THESE INSTRUCTIONS MAY RESULT IN THE CANCELLATION OF YOUR SURGERY  PATIENT SIGNATURE_________________________________  NURSE SIGNATURE__________________________________  ________________________________________________________________________        WHAT IS A BLOOD TRANSFUSION? Blood Transfusion Information  A transfusion is the replacement of blood or some of its parts. Blood is made up of multiple cells which provide different functions. Red blood cells carry oxygen and are used for blood loss replacement. White blood cells fight against infection. Platelets control bleeding. Plasma helps clot blood. Other blood products are available for specialized needs, such as hemophilia or other clotting disorders. BEFORE THE TRANSFUSION  Who gives blood for transfusions?  Healthy volunteers who are fully evaluated to make sure their blood is safe.  This is blood bank blood. Transfusion therapy is the safest it has ever been in the practice of medicine. Before blood is taken from a donor, a complete history is taken to make sure that person has no history of diseases nor engages in risky social behavior (examples are intravenous drug use or sexual activity with multiple partners). The donor's travel history is screened to minimize risk of transmitting infections, such as malaria. The donated blood is tested for signs of infectious diseases, such as HIV and hepatitis. The blood is then tested to be sure it is compatible with you in order to minimize the chance of a transfusion reaction. If you or a relative donates blood, this is often done in anticipation of surgery and is not appropriate for emergency situations. It takes many days to process the donated blood. RISKS AND COMPLICATIONS Although transfusion therapy is very safe and saves many lives, the main dangers of transfusion include:  Getting an infectious disease. Developing a transfusion reaction. This is an allergic reaction to something in the blood you were given. Every precaution is taken to prevent this. The decision to  have a blood transfusion has been considered carefully by your caregiver before blood is given. Blood is not given unless the benefits outweigh the risks. AFTER THE TRANSFUSION Right after receiving a blood transfusion, you will usually feel much better and more energetic. This is especially true if your red blood cells have gotten low (anemic). The transfusion raises the level of the red blood cells which carry oxygen, and this usually causes an energy increase. The nurse administering the transfusion will monitor you carefully for complications. HOME CARE INSTRUCTIONS  No special instructions are needed after a transfusion. You may find your energy is better. Speak with your caregiver about any limitations on activity for underlying diseases you may have. SEEK MEDICAL  CARE IF:  Your condition is not improving after your transfusion. You develop redness or irritation at the intravenous (IV) site. SEEK IMMEDIATE MEDICAL CARE IF:  Any of the following symptoms occur over the next 12 hours: Shaking chills. You have a temperature by mouth above 102 F (38.9 C), not controlled by medicine. Chest, back, or muscle pain. People around you feel you are not acting correctly or are confused. Shortness of breath or difficulty breathing. Dizziness and fainting. You get a rash or develop hives. You have a decrease in urine output. Your urine turns a dark color or changes to pink, red, or brown. Any of the following symptoms occur over the next 10 days: You have a temperature by mouth above 102 F (38.9 C), not controlled by medicine. Shortness of breath. Weakness after normal activity. The white part of the eye turns yellow (jaundice). You have a decrease in the amount of urine or are urinating less often. Your urine turns a dark color or changes to pink, red, or brown. Document Released: 02/08/2000 Document Revised: 05/05/2011 Document Reviewed: 09/27/2007 [facility] Patient Information 2014 [place], [place].  _______________________________________________________________________

## 2022-03-30 NOTE — Progress Notes (Signed)
COVID Vaccine received:  '[]'$  No '[x]'$  Yes Date of any COVID positive Test in last 90 days:  PCP - Merrilee Seashore, MD Cardiologist - none  Chest x-[place] -  EKG -  12-28-2020  Epic  will repeat at PST Stress Test -  ECHO - 12-29-2020  Epic Cardiac Cath -   PCR screen: '[]'$  Ordered & Completed                      '[]'$   No Order but Needs PROFEND                      '[x]'$   N/A for this surgery  Surgery Plan:  '[]'$  Ambulatory                            '[x]'$  Outpatient in bed                            '[]'$  Admit  Anesthesia:    '[x]'$  General  '[]'$  Spinal                           '[]'$   Choice '[]'$   MAC  Bowel Prep - '[x]'$  No  '[]'$   Yes __ GYN diet only  Pacemaker / ICD device '[x]'$  No '[]'$  Yes        Device order form faxed '[x]'$  No    '[]'$   Yes      Faxed to:  Spinal Cord Stimulator:'[x]'$  No '[]'$  Yes      (Remind patient to bring remote DOS) Other Implants:   History of Sleep Apnea? '[x]'$  No '[]'$  Yes   CPAP used?- '[x]'$  No '[]'$  Yes    Does the patient monitor blood sugar? '[]'$  No '[]'$  Yes  '[]'$  N/A  ? Pre-DM  Patient has: '[]'$  Pre-DM   '[]'$  DM1  '[]'$   DM2 Does patient have a Freestyle Libre or Dexacom? '[]'$  No '[]'$  Yes   Fasting Blood Sugar Ranges-  Checks Blood Sugar _____ times a day  Blood Thinner / Instructions:  none Aspirin Instructions:  none  ERAS Protocol Ordered: '[]'$  No  '[x]'$  Yes PRE-SURGERY '[]'$  ENSURE  '[]'$  G2  '[x]'$  No Drink Ordered Patient is to be NPO after: 04:30 am   Comments:   Activity level: Patient can / can not climb a flight of stairs without difficulty; '[]'$  No CP  '[]'$  No SOB, but would have ______   Patient can / can not perform ADLs without assistance.   Anesthesia review: HTN, Anxiety, GERD, migraines. Chronic lumbar back pain.   Patient denies shortness of breath, fever, cough and chest pain at PAT appointment.  Patient verbalized understanding and agreement to the Pre-Surgical Instructions that were given to them at this PAT appointment. Patient was also educated of the need to review these PAT  instructions again prior to his/her surgery.I reviewed the appropriate phone numbers to call if they have any and questions or concerns.

## 2022-03-31 ENCOUNTER — Other Ambulatory Visit: Payer: Self-pay

## 2022-04-01 ENCOUNTER — Encounter (HOSPITAL_COMMUNITY): Payer: Self-pay

## 2022-04-01 ENCOUNTER — Encounter (HOSPITAL_COMMUNITY)
Admission: RE | Admit: 2022-04-01 | Discharge: 2022-04-01 | Disposition: A | Discharge status: Home / Self Care | Payer: Medicare HMO | Source: Ambulatory Visit | Attending: Psychiatry | Admitting: Psychiatry

## 2022-04-01 ENCOUNTER — Other Ambulatory Visit: Payer: Self-pay

## 2022-04-01 VITALS — BP 128/88 | HR 75 | Temp 98.4°F | Resp 18 | Ht 63.0 in

## 2022-04-01 DIAGNOSIS — I10 Essential (primary) hypertension: Secondary | ICD-10-CM | POA: Insufficient documentation

## 2022-04-01 DIAGNOSIS — Z01818 Encounter for other preprocedural examination: Secondary | ICD-10-CM | POA: Diagnosis not present

## 2022-04-01 DIAGNOSIS — N9489 Other specified conditions associated with female genital organs and menstrual cycle: Secondary | ICD-10-CM | POA: Diagnosis not present

## 2022-04-01 HISTORY — DX: Cerebral infarction, unspecified: I63.9

## 2022-04-01 HISTORY — DX: Other symptoms and signs involving the musculoskeletal system: R29.898

## 2022-04-01 LAB — COMPREHENSIVE METABOLIC PANEL
ALT: 9 U/L (ref 0–44)
AST: 18 U/L (ref 15–41)
Albumin: 3.6 g/dL (ref 3.5–5.0)
Alkaline Phosphatase: 89 U/L (ref 38–126)
Anion gap: 7 (ref 5–15)
BUN: 16 mg/dL (ref 8–23)
CO2: 30 mmol/L (ref 22–32)
Calcium: 9.4 mg/dL (ref 8.9–10.3)
Chloride: 103 mmol/L (ref 98–111)
Creatinine, Ser: 0.55 mg/dL (ref 0.44–1.00)
GFR, Estimated: >60 mL/min (ref >60)
Glucose, Bld: 101 mg/dL — ABNORMAL HIGH (ref 70–99)
Potassium: 4.6 mmol/L (ref 3.5–5.1)
Sodium: 140 mmol/L (ref 135–145)
Total Bilirubin: 0.6 mg/dL (ref 0.3–1.2)
Total Protein: 7.2 g/dL (ref 6.5–8.1)

## 2022-04-01 LAB — CBC
HCT: 41.9 % (ref 36.0–46.0)
Hemoglobin: 12.7 g/dL (ref 12.0–15.0)
MCH: 28.2 pg (ref 26.0–34.0)
MCHC: 30.3 g/dL (ref 30.0–36.0)
MCV: 92.9 fL (ref 80.0–100.0)
Platelets: 323 10*3/uL (ref 150–400)
RBC: 4.51 MIL/uL (ref 3.87–5.11)
RDW: 14 % (ref 11.5–15.5)
WBC: 7.9 10*3/uL (ref 4.0–10.5)
nRBC: 0 % (ref 0.0–0.2)

## 2022-04-02 ENCOUNTER — Telehealth: Payer: Self-pay | Admitting: Registered Nurse

## 2022-04-02 ENCOUNTER — Encounter: Payer: Self-pay | Admitting: Physical Medicine & Rehabilitation

## 2022-04-02 DIAGNOSIS — M7522 Bicipital tendinitis, left shoulder: Secondary | ICD-10-CM

## 2022-04-02 MED ORDER — OXYCODONE-ACETAMINOPHEN 10-325 MG PO TABS: 1.0000 | ORAL_TABLET | Freq: Every day | ORAL | 0 refills | Status: DC | PRN

## 2022-04-02 NOTE — Telephone Encounter (Signed)
PMP was Reviewed.  Oxycodone e-scribed today.  

## 2022-04-07 ENCOUNTER — Encounter (HOSPITAL_COMMUNITY): Payer: Self-pay | Admitting: Psychiatry

## 2022-04-07 ENCOUNTER — Telehealth: Payer: Self-pay

## 2022-04-07 ENCOUNTER — Telehealth: Payer: Self-pay | Admitting: *Deleted

## 2022-04-07 NOTE — Anesthesia Preprocedure Evaluation (Signed)
Anesthesia Evaluation  Patient identified by MRN, date of birth, ID band Patient awake    Reviewed: Allergy & Precautions, NPO status , Patient's Chart, lab work & pertinent test results  History of Anesthesia Complications Negative for: history of anesthetic complications  Airway Mallampati: II  TM Distance: >3 FB Neck ROM: Full  Mouth opening: Limited Mouth Opening  Dental  (+) Missing,    Pulmonary former smoker   Pulmonary exam normal        Cardiovascular hypertension, Pt. on medications Normal cardiovascular exam     Neuro/Psych  Headaches  Anxiety Depression    CVA    GI/Hepatic Neg liver ROS,GERD  ,,  Endo/Other  negative endocrine ROS    Renal/GU negative Renal ROS  negative genitourinary   Musculoskeletal  (+) Arthritis ,    Abdominal   Peds  Hematology negative hematology ROS (+)   Anesthesia Other Findings Day of surgery medications reviewed with patient.  Reproductive/Obstetrics negative OB ROS                              Anesthesia Physical Anesthesia Plan  ASA: 3  Anesthesia Plan: General   Post-op Pain Management: Tylenol PO (pre-op)*   Induction: Intravenous  PONV Risk Score and Plan: 3 and Treatment may vary due to age or medical condition, Ondansetron, Dexamethasone and Propofol infusion  Airway Management Planned: Oral ETT  Additional Equipment: None  Intra-op Plan:   Post-operative Plan: Extubation in OR  Informed Consent: I have reviewed the patients History and Physical, chart, labs and discussed the procedure including the risks, benefits and alternatives for the proposed anesthesia with the patient or authorized representative who has indicated his/her understanding and acceptance.     Dental advisory given  Plan Discussed with: CRNA  Anesthesia Plan Comments:         Anesthesia Quick Evaluation

## 2022-04-07 NOTE — Telephone Encounter (Addendum)
Telephone call to check on pre-operative status.  Patient compliant with pre-operative instructions.  Reinforced nothing to eat after midnight. Clear liquids until 0430. Patient to arrive at 0515.  No questions or concerns voiced.  Instructed to call for any needs.  ?

## 2022-04-07 NOTE — Telephone Encounter (Signed)
Per Dr Ernestina Patches, fax surigcal optimization form and records to PCP

## 2022-04-08 ENCOUNTER — Other Ambulatory Visit: Payer: Self-pay

## 2022-04-08 ENCOUNTER — Ambulatory Visit (HOSPITAL_COMMUNITY)
Admission: RE | Admit: 2022-04-08 | Discharge: 2022-04-08 | Disposition: A | Discharge status: Home / Self Care | Payer: Medicare HMO | Source: Ambulatory Visit | Attending: Psychiatry | Admitting: Psychiatry

## 2022-04-08 ENCOUNTER — Encounter (HOSPITAL_COMMUNITY)
Admission: RE | Disposition: A | Discharge status: Home / Self Care | Payer: Self-pay | Source: Ambulatory Visit | Attending: Psychiatry

## 2022-04-08 ENCOUNTER — Ambulatory Visit (HOSPITAL_BASED_OUTPATIENT_CLINIC_OR_DEPARTMENT_OTHER): Payer: Medicare HMO | Admitting: Anesthesiology

## 2022-04-08 ENCOUNTER — Ambulatory Visit (HOSPITAL_COMMUNITY): Payer: Medicare HMO | Admitting: Anesthesiology

## 2022-04-08 ENCOUNTER — Encounter (HOSPITAL_COMMUNITY): Payer: Self-pay | Admitting: Psychiatry

## 2022-04-08 DIAGNOSIS — R69 Illness, unspecified: Secondary | ICD-10-CM | POA: Diagnosis not present

## 2022-04-08 DIAGNOSIS — R978 Other abnormal tumor markers: Secondary | ICD-10-CM | POA: Diagnosis not present

## 2022-04-08 DIAGNOSIS — C786 Secondary malignant neoplasm of retroperitoneum and peritoneum: Secondary | ICD-10-CM

## 2022-04-08 DIAGNOSIS — Z87891 Personal history of nicotine dependence: Secondary | ICD-10-CM | POA: Diagnosis not present

## 2022-04-08 DIAGNOSIS — F418 Other specified anxiety disorders: Secondary | ICD-10-CM

## 2022-04-08 DIAGNOSIS — C482 Malignant neoplasm of peritoneum, unspecified: Secondary | ICD-10-CM | POA: Insufficient documentation

## 2022-04-08 DIAGNOSIS — Z8249 Family history of ischemic heart disease and other diseases of the circulatory system: Secondary | ICD-10-CM | POA: Diagnosis not present

## 2022-04-08 DIAGNOSIS — I10 Essential (primary) hypertension: Secondary | ICD-10-CM | POA: Insufficient documentation

## 2022-04-08 DIAGNOSIS — K219 Gastro-esophageal reflux disease without esophagitis: Secondary | ICD-10-CM | POA: Insufficient documentation

## 2022-04-08 DIAGNOSIS — Z90710 Acquired absence of both cervix and uterus: Secondary | ICD-10-CM | POA: Insufficient documentation

## 2022-04-08 DIAGNOSIS — C569 Malignant neoplasm of unspecified ovary: Secondary | ICD-10-CM | POA: Diagnosis present

## 2022-04-08 DIAGNOSIS — N838 Other noninflammatory disorders of ovary, fallopian tube and broad ligament: Secondary | ICD-10-CM

## 2022-04-08 DIAGNOSIS — N9489 Other specified conditions associated with female genital organs and menstrual cycle: Secondary | ICD-10-CM

## 2022-04-08 DIAGNOSIS — N839 Noninflammatory disorder of ovary, fallopian tube and broad ligament, unspecified: Secondary | ICD-10-CM | POA: Diagnosis not present

## 2022-04-08 DIAGNOSIS — K660 Peritoneal adhesions (postprocedural) (postinfection): Secondary | ICD-10-CM | POA: Diagnosis not present

## 2022-04-08 DIAGNOSIS — C801 Malignant (primary) neoplasm, unspecified: Secondary | ICD-10-CM | POA: Diagnosis not present

## 2022-04-08 HISTORY — PX: LAPAROSCOPY: SHX197

## 2022-04-08 LAB — TYPE AND SCREEN
ABO/RH(D): AB NEG
Antibody Screen: NEGATIVE

## 2022-04-08 SURGERY — LAPAROSCOPY, DIAGNOSTIC
Anesthesia: General

## 2022-04-08 MED ORDER — PROPOFOL 1000 MG/100ML IV EMUL
INTRAVENOUS | Status: AC
  Filled 2022-04-08: qty 100

## 2022-04-08 MED ORDER — DEXAMETHASONE SODIUM PHOSPHATE 10 MG/ML IJ SOLN
INTRAMUSCULAR | Status: AC
  Filled 2022-04-08: qty 2

## 2022-04-08 MED ORDER — PROPOFOL 10 MG/ML IV BOLUS
INTRAVENOUS | Status: AC
  Filled 2022-04-08: qty 20

## 2022-04-08 MED ORDER — PROPOFOL 10 MG/ML IV BOLUS
INTRAVENOUS | Status: DC | PRN
  Administered 2022-04-08: 25 ug/kg/min via INTRAVENOUS
  Administered 2022-04-08: 100 mg via INTRAVENOUS

## 2022-04-08 MED ORDER — FENTANYL CITRATE (PF) 100 MCG/2ML IJ SOLN
INTRAMUSCULAR | Status: AC
  Filled 2022-04-08: qty 2

## 2022-04-08 MED ORDER — SUGAMMADEX SODIUM 200 MG/2ML IV SOLN
INTRAVENOUS | Status: DC | PRN
  Administered 2022-04-08: 195 mg via INTRAVENOUS

## 2022-04-08 MED ORDER — FENTANYL CITRATE (PF) 100 MCG/2ML IJ SOLN
INTRAMUSCULAR | Status: DC | PRN
  Administered 2022-04-08: 50 ug via INTRAVENOUS
  Administered 2022-04-08: 100 ug via INTRAVENOUS
  Administered 2022-04-08: 50 ug via INTRAVENOUS

## 2022-04-08 MED ORDER — BUPIVACAINE LIPOSOME 1.3 % IJ SUSP
INTRAMUSCULAR | Status: AC
  Filled 2022-04-08: qty 20

## 2022-04-08 MED ORDER — LACTATED RINGERS IV SOLN: INTRAVENOUS | Status: DC

## 2022-04-08 MED ORDER — AMISULPRIDE (ANTIEMETIC) 5 MG/2ML IV SOLN: 10.0000 mg | Freq: Once | INTRAVENOUS | Status: DC | PRN

## 2022-04-08 MED ORDER — ORAL CARE MOUTH RINSE: 15.0000 mL | Freq: Once | OROMUCOSAL | Status: AC

## 2022-04-08 MED ORDER — ACETAMINOPHEN 500 MG PO TABS
1000.0000 mg | ORAL_TABLET | ORAL | Status: AC
  Administered 2022-04-08: 1000 mg via ORAL
  Filled 2022-04-08: qty 2

## 2022-04-08 MED ORDER — CHLORHEXIDINE GLUCONATE 0.12 % MT SOLN
15.0000 mL | Freq: Once | OROMUCOSAL | Status: AC
  Administered 2022-04-08: 15 mL via OROMUCOSAL

## 2022-04-08 MED ORDER — GLYCOPYRROLATE 0.2 MG/ML IJ SOLN
INTRAMUSCULAR | Status: AC
  Filled 2022-04-08: qty 1

## 2022-04-08 MED ORDER — ROCURONIUM BROMIDE 10 MG/ML (PF) SYRINGE
PREFILLED_SYRINGE | INTRAVENOUS | Status: AC
  Filled 2022-04-08: qty 20

## 2022-04-08 MED ORDER — DEXAMETHASONE SODIUM PHOSPHATE 4 MG/ML IJ SOLN: 4.0000 mg | INTRAMUSCULAR | Status: DC

## 2022-04-08 MED ORDER — LIDOCAINE HCL (PF) 2 % IJ SOLN
INTRAMUSCULAR | Status: AC
  Filled 2022-04-08: qty 10

## 2022-04-08 MED ORDER — HYDROMORPHONE HCL 1 MG/ML IJ SOLN: 0.2500 mg | INTRAMUSCULAR | Status: DC | PRN

## 2022-04-08 MED ORDER — ONDANSETRON HCL 4 MG/2ML IJ SOLN
INTRAMUSCULAR | Status: DC | PRN
  Administered 2022-04-08: 4 mg via INTRAVENOUS

## 2022-04-08 MED ORDER — LIDOCAINE 2% (20 MG/ML) 5 ML SYRINGE
INTRAMUSCULAR | Status: DC | PRN
  Administered 2022-04-08: 80 mg via INTRAVENOUS

## 2022-04-08 MED ORDER — BUPIVACAINE LIPOSOME 1.3 % IJ SUSP
INTRAMUSCULAR | Status: DC | PRN
  Administered 2022-04-08: 20 mL

## 2022-04-08 MED ORDER — POVIDONE-IODINE 10 % EX SWAB
2.0000 | Freq: Once | CUTANEOUS | Status: AC
  Administered 2022-04-08: 2 via TOPICAL

## 2022-04-08 MED ORDER — LACTATED RINGERS IR SOLN
Status: DC | PRN
  Administered 2022-04-08: 1000 mL

## 2022-04-08 MED ORDER — ACETAMINOPHEN 500 MG PO TABS: 1000.0000 mg | ORAL_TABLET | Freq: Once | ORAL | Status: DC

## 2022-04-08 MED ORDER — DEXAMETHASONE SODIUM PHOSPHATE 10 MG/ML IJ SOLN
INTRAMUSCULAR | Status: DC | PRN
  Administered 2022-04-08: 8 mg via INTRAVENOUS

## 2022-04-08 MED ORDER — LACTATED RINGERS IV SOLN: INTRAVENOUS | Status: DC | PRN

## 2022-04-08 MED ORDER — BUPIVACAINE HCL 0.25 % IJ SOLN
INTRAMUSCULAR | Status: DC | PRN
  Administered 2022-04-08: 50 mL

## 2022-04-08 MED ORDER — BUPIVACAINE HCL 0.25 % IJ SOLN
INTRAMUSCULAR | Status: AC
  Filled 2022-04-08: qty 1

## 2022-04-08 MED ORDER — HEPARIN SODIUM (PORCINE) 5000 UNIT/ML IJ SOLN
5000.0000 [IU] | INTRAMUSCULAR | Status: AC
  Administered 2022-04-08: 5000 [IU] via SUBCUTANEOUS
  Filled 2022-04-08: qty 1

## 2022-04-08 MED ORDER — ONDANSETRON HCL 4 MG/2ML IJ SOLN
INTRAMUSCULAR | Status: AC
  Filled 2022-04-08: qty 4

## 2022-04-08 MED ORDER — ROCURONIUM BROMIDE 10 MG/ML (PF) SYRINGE
PREFILLED_SYRINGE | INTRAVENOUS | Status: DC | PRN
  Administered 2022-04-08: 80 mg via INTRAVENOUS

## 2022-04-08 SURGICAL SUPPLY — 81 items
ADH SKN CLS APL DERMABOND .7 (GAUZE/BANDAGES/DRESSINGS) ×2
AGENT HMST KT MTR STRL THRMB (HEMOSTASIS)
APL ESCP 34 STRL LF DISP (HEMOSTASIS)
APPLICATOR SURGIFLO ENDO (HEMOSTASIS) IMPLANT
BAG LAPAROSCOPIC 12 15 PORT 16 (BASKET) IMPLANT
BAG RETRIEVAL 12/15 (BASKET)
BLADE SURG SZ10 CARB STEEL (BLADE) IMPLANT
COVER BACK TABLE 60X90IN (DRAPES) ×2 IMPLANT
COVER TIP SHEARS 8 DVNC (MISCELLANEOUS) ×3 IMPLANT
COVER TIP SHEARS 8MM DA VINCI (MISCELLANEOUS) ×2
DERMABOND ADVANCED .7 DNX12 (GAUZE/BANDAGES/DRESSINGS) ×3 IMPLANT
DRAPE ARM DVNC X/XI (DISPOSABLE) ×8 IMPLANT
DRAPE COLUMN DVNC XI (DISPOSABLE) ×2 IMPLANT
DRAPE DA VINCI XI ARM (DISPOSABLE)
DRAPE DA VINCI XI COLUMN (DISPOSABLE)
DRAPE SHEET LG 3/4 BI-LAMINATE (DRAPES) ×3 IMPLANT
DRAPE SURG IRRIG POUCH 19X23 (DRAPES) ×3 IMPLANT
DRSG OPSITE POSTOP 4X6 (GAUZE/BANDAGES/DRESSINGS) IMPLANT
DRSG OPSITE POSTOP 4X8 (GAUZE/BANDAGES/DRESSINGS) IMPLANT
ELECT PENCIL ROCKER SW 15FT (MISCELLANEOUS) IMPLANT
ELECT REM PT RETURN 15FT ADLT (MISCELLANEOUS) ×3 IMPLANT
GAUZE 4X4 16PLY ~~LOC~~+RFID DBL (SPONGE) ×2 IMPLANT
GLOVE BIO SURGEON STRL SZ 6 (GLOVE) ×12 IMPLANT
GLOVE BIO SURGEON STRL SZ 6.5 (GLOVE) ×3 IMPLANT
GLOVE BIOGEL PI IND STRL 6.5 (GLOVE) ×6 IMPLANT
GOWN STRL REUS W/ TWL LRG LVL3 (GOWN DISPOSABLE) ×12 IMPLANT
GOWN STRL REUS W/TWL LRG LVL3 (GOWN DISPOSABLE) ×8
GRASPER SUT TROCAR 14GX15 (MISCELLANEOUS) IMPLANT
HOLDER FOLEY CATH W/STRAP (MISCELLANEOUS) IMPLANT
IRRIG SUCT STRYKERFLOW 2 WTIP (MISCELLANEOUS) ×2
IRRIGATION SUCT STRKRFLW 2 WTP (MISCELLANEOUS) ×3 IMPLANT
KIT TURNOVER KIT A (KITS) IMPLANT
LIGASURE BLUNT TIP 5 LONG 44CM (ELECTROSURGICAL) ×1 IMPLANT
LIGASURE IMPACT 36 18CM CVD LR (INSTRUMENTS) ×1 IMPLANT
MANIPULATOR ADVINCU DEL 3.0 PL (MISCELLANEOUS) IMPLANT
MANIPULATOR ADVINCU DEL 3.5 PL (MISCELLANEOUS) IMPLANT
MANIPULATOR UTERINE 4.5 ZUMI (MISCELLANEOUS) IMPLANT
NDL HYPO 21X1.5 SAFETY (NEEDLE) ×2 IMPLANT
NDL INSUFFLATION 14GA 120MM (NEEDLE) IMPLANT
NDL SPNL 18GX3.5 QUINCKE PK (NEEDLE) IMPLANT
NEEDLE HYPO 21X1.5 SAFETY (NEEDLE) ×2 IMPLANT
NEEDLE INSUFFLATION 14GA 120MM (NEEDLE) IMPLANT
NEEDLE SPNL 18GX3.5 QUINCKE PK (NEEDLE) IMPLANT
OBTURATOR OPTICAL STANDARD 8MM (TROCAR) ×2
OBTURATOR OPTICAL STND 8 DVNC (TROCAR) ×2
OBTURATOR OPTICALSTD 8 DVNC (TROCAR) ×3 IMPLANT
PACK ROBOT GYN CUSTOM WL (TRAY / TRAY PROCEDURE) ×3 IMPLANT
PAD ARMBOARD 7.5X6 YLW CONV (MISCELLANEOUS) ×3 IMPLANT
PAD POSITIONING PINK XL (MISCELLANEOUS) ×3 IMPLANT
PORT ACCESS TROCAR AIRSEAL 12 (TROCAR) IMPLANT
SEAL CANN UNIV 5-8 DVNC XI (MISCELLANEOUS) ×12 IMPLANT
SEAL XI 5MM-8MM UNIVERSAL (MISCELLANEOUS) ×8
SET TRI-LUMEN FLTR TB AIRSEAL (TUBING) ×3 IMPLANT
SOL PREP POV-IOD 4OZ 10% (MISCELLANEOUS) ×5 IMPLANT
SPIKE FLUID TRANSFER (MISCELLANEOUS) ×3 IMPLANT
SPONGE T-LAP 18X18 ~~LOC~~+RFID (SPONGE) IMPLANT
SURGIFLO W/THROMBIN 8M KIT (HEMOSTASIS) IMPLANT
SUT MNCRL AB 4-0 PS2 18 (SUTURE) IMPLANT
SUT PDS AB 1 TP1 96 (SUTURE) IMPLANT
SUT VIC AB 0 CT1 27 (SUTURE)
SUT VIC AB 0 CT1 27XBRD ANTBC (SUTURE) IMPLANT
SUT VIC AB 2-0 CT1 27 (SUTURE)
SUT VIC AB 2-0 CT1 TAPERPNT 27 (SUTURE) IMPLANT
SUT VIC AB 4-0 PS2 18 (SUTURE) ×6 IMPLANT
SUT VLOC 180 0 9IN  GS21 (SUTURE)
SUT VLOC 180 0 9IN GS21 (SUTURE) IMPLANT
SYR 10ML LL (SYRINGE) IMPLANT
SYS BAG RETRIEVAL 10MM (BASKET)
SYS RETRIEVAL 5MM INZII UNIV (BASKET) ×2
SYS WOUND ALEXIS 18CM MED (MISCELLANEOUS)
SYSTEM BAG RETRIEVAL 10MM (BASKET) IMPLANT
SYSTEM RETRIEVL 5MM INZII UNIV (BASKET) ×1 IMPLANT
SYSTEM WOUND ALEXIS 18CM MED (MISCELLANEOUS) IMPLANT
TOWEL OR NON WOVEN STRL DISP B (DISPOSABLE) IMPLANT
TRAP SPECIMEN MUCUS 40CC (MISCELLANEOUS) ×1 IMPLANT
TRAY FOLEY MTR SLVR 16FR STAT (SET/KITS/TRAYS/PACK) ×3 IMPLANT
TROCAR PORT AIRSEAL 5X120 (TROCAR) IMPLANT
TROCAR XCEL NON-BLD 5MMX100MML (ENDOMECHANICALS) ×2 IMPLANT
UNDERPAD 30X36 HEAVY ABSORB (UNDERPADS AND DIAPERS) ×6 IMPLANT
WATER STERILE IRR 1000ML POUR (IV SOLUTION) ×3 IMPLANT
YANKAUER SUCT BULB TIP 10FT TU (MISCELLANEOUS) IMPLANT

## 2022-04-08 NOTE — Op Note (Signed)
GYNECOLOGIC ONCOLOGY OPERATIVE NOTE  Date of Service: 04/08/2022  Preoperative Diagnosis: Complex adnexal mass, elevated tumor markers, imaging concerning for carcinomatosis  Postoperative Diagnosis: Same, omental cake and peritoneal carcinomatosis  Procedures: Diagnostic laparoscopy with peritoneal and omental biopsies  Surgeon: Bernadene Bell, MD  Assistants: Lahoma Crocker, MD and (an MD assistant was necessary for tissue manipulation, management of robotic instrumentation, retraction and positioning due to the complexity of the case and hospital policies)  Anesthesia: General  Estimated Blood Loss: 25 mL    Fluids: 200 ml, crystalloid  Urine Output: 600 ml, clear yellow  Findings: On entry to abdomen, omentum and large bowel broadly and densely adherent to the anterior abdominal wall in the midline from the level of the falciform ligament superiorly to the bladder inferiorly. Evidence of peritoneal plaques consistent with peritoneal carcinomatosis on the left diaphragm and left anterior abdominal wall and pelvic peritoneum. Right diaphragm and liver limited in visualization but no evidence of disease. Left liver lobe and stomach normal. Right abdomen not visualized due to the midline adhesions. Pelvic mass not visualized due to adhesions. Omentum adherent to the anterior abdominal wall consistent with omental cake. Small bowel and mesentery that is able to be visualized does not appear to have disease. Initial peritoneal biopsy indeterminate for carcinoma on frozen. Additional biopsies obtained.  Specimens:  ID Type Source Tests Collected by Time Destination  1 : Peritoneal biopsy Tissue PATH Soft tissue SURGICAL PATHOLOGY Bernadene Bell, MD 04/08/2022 0831   2 : Peritoneal biopsy #2 Tissue PATH Soft tissue SURGICAL PATHOLOGY Bernadene Bell, MD 04/08/2022 [phone]   3 : Omental biopsy Tissue PATH Soft tissue SURGICAL PATHOLOGY Bernadene Bell, MD 04/08/2022 [phone]   A : Pelvic  washings Body Fluid PATH Cytology Pelvic Washing CYTOLOGY - NON PAP Bernadene Bell, MD [id number]     Complications:  None  Indications for Procedure: Alexandria Mclaughlin is a 82 y.o. woman with a complex adnexal mass, elevated CA125 and imaging findings concerning for peritoneal carcinomatosis.  Prior to the procedure, all risks, benefits, and alternatives were discussed and informed surgical consent was signed.  Procedure: Patient was taken to the operating room where general anesthesia was achieved.  She was positioned in dorsal lithotomy and prepped and draped.  A foley catheter was inserted into the bladder.   A 10 mm skin incision was made in the left upper quadrant near Palmer's point.  The abdomen was entered with a 5 mm OptiView trocar under direct visualization.  The abdomen was insufflated with the findings as noted above. The patient was placed into Trendelenburg without additional visualization of the pelvis achieved. The patient was then returned to level position. Two additional 10m trocars were placed in the left abdomen.  All trocars were placed under direct visualization. Pelvic washings were obtained. Using monopolar scissors and a combination of sharp dissection and cautery, two peritoneal biopsies were obtained of the anterior inferior abdominal peritoneum that appeared to be involved with peritoneal carcinomatosis. The first biopsy was sent for frozen pathology with indeterminate findings. At this time, decision was made to take an additional biopsy of the omental cake adherent to the anterior abdominal wall. Using the ligasure device, and omental biopsy was obtained. This sample was placed in an endocatch bag and removed through the left lower trocar. Cautery was used to achieve hemostasis at all biopsy sites. The pelvis was irrigated and all operative sites were found to be hemostatic.   All instruments were removed. The abdomen was desufflated and all  ports were removed. The skin at  all incisions was closed with 4-0 Vicryl to reapproximate the subcutaneous tissue and 4-0 monocryl in a subcuticular fashion followed by surgical glue.  Patient tolerated the procedure well. Sponge, lap, and instrument counts were correct. No perioperative antibiotic prophylaxis was indicated. She was extubated and taken to the PACU in stable condition.  Bernadene Bell, MD Gynecologic Oncology

## 2022-04-08 NOTE — Telephone Encounter (Signed)
Patient was called. Rx was received.

## 2022-04-08 NOTE — Anesthesia Procedure Notes (Signed)
Procedure Name: Intubation Date/Time: 04/08/2022 7:45 AM  Performed by: Lavina Hamman, CRNAPre-anesthesia Checklist: Patient identified, Emergency Drugs available, Suction available, Patient being monitored and Timeout performed Patient Re-evaluated:Patient Re-evaluated prior to induction Oxygen Delivery Method: Circle system utilized Preoxygenation: Pre-oxygenation with 100% oxygen Induction Type: IV induction Ventilation: Mask ventilation without difficulty Laryngoscope Size: Mac and 3 Grade View: Grade III Tube type: Oral Tube size: 7.0 mm Number of attempts: 2 Airway Equipment and Method: Stylet and Video-laryngoscopy Placement Confirmation: ETT inserted through vocal cords under direct vision, positive ETCO2, CO2 detector and breath sounds checked- equal and bilateral Secured at: 21 cm Tube secured with: Tape Dental Injury: Teeth and Oropharynx as per pre-operative assessment  Comments: ATOI, multiple front chipped teeth remain as in preop.  G1 with Glidescope 3.

## 2022-04-08 NOTE — Transfer of Care (Signed)
Immediate Anesthesia Transfer of Care Note  Patient: Alexandria Mclaughlin  Procedure(s) Performed: Procedure(s): LAPAROSCOPY DIAGNOSTIC WITH BIOPSIES (N/A)  Patient Location: PACU  Anesthesia Type:General  Level of Consciousness:  sedated, patient cooperative and responds to stimulation  Airway & Oxygen Therapy:Patient Spontanous Breathing and Patient connected to face mask oxgen  Post-op Assessment:  Report given to PACU RN and Post -op Vital signs reviewed and stable  Post vital signs:  Reviewed and stable  Last Vitals:  Vitals:   04/08/22 0553 04/08/22 0954  BP: (!) 141/82 (!) 156/81  Pulse: 80 78  Resp: 18 12  Temp: 36.9 C   SpO2: [id number]    Complications: No apparent anesthesia complications

## 2022-04-08 NOTE — Discharge Instructions (Addendum)
AFTER SURGERY INSTRUCTIONS   Return to work: 4-6 weeks if applicable   Activity: 1. Be up and out of the bed during the day.  Take a nap if needed.  You may walk up steps but be careful and use the hand rail.  Stair climbing will tire you more than you think, you may need to stop part way and rest.    2. No lifting or straining for 6 weeks over 10 pounds. No pushing, pulling, straining for 6 weeks.   3. No driving for around 1 week(s).  Do not drive if you are taking narcotic pain medicine and make sure that your reaction time has returned.    4. You can shower as soon as the next day after surgery. Shower daily.  Use your regular soap and water (not directly on the incision) and pat your incision(s) dry afterwards; don't rub.  No tub baths or submerging your body in water until cleared by your surgeon. If you have the soap that was given to you by pre-surgical testing that was used before surgery, you do not need to use it afterwards because this can irritate your incisions.    5. You may experience a small amount of clear drainage from your incisions, which is normal.  If the drainage persists, increases, or changes color please call the office.   6. Do not use creams, lotions, or ointments such as neosporin on your incisions after surgery until advised by your surgeon because they can cause removal of the dermabond glue on your incisions.     Diet: 1. Low sodium Heart Healthy Diet is recommended but you are cleared to resume your normal (before surgery) diet after your procedure.   2. It is safe to use a laxative, such as Miralax or Colace, if you have difficulty moving your bowels.     Wound Care: 1. Keep clean and dry.  Shower daily.   Reasons to call the Doctor: Fever - Oral temperature greater than 100.4 degrees Fahrenheit Foul-smelling vaginal discharge Difficulty urinating Nausea and vomiting Increased pain at the site of the incision that is unrelieved with pain  medicine. Difficulty breathing with or without chest pain New calf pain especially if only on one side Sudden, continuing increased vaginal bleeding with or without clots.   Contacts: For questions or concerns you should contact:   Dr. Bernadene Bell at [place], NP at [phone]   After Hours: call [phone] and have the GYN Oncologist paged/contacted (after 5 pm or on the weekends).   Messages sent via mychart are for non-urgent matters and are not responded to after hours so for urgent needs, please call the after hours number.

## 2022-04-08 NOTE — Anesthesia Postprocedure Evaluation (Signed)
Anesthesia Post Note  Patient: Alexandria Mclaughlin  Procedure(s) Performed: LAPAROSCOPY DIAGNOSTIC WITH BIOPSIES     Patient location during evaluation: PACU Anesthesia Type: General Level of consciousness: awake and alert Pain management: pain level controlled Vital Signs Assessment: post-procedure vital signs reviewed and stable Respiratory status: spontaneous breathing, nonlabored ventilation and respiratory function stable Cardiovascular status: blood pressure returned to baseline Postop Assessment: no apparent nausea or vomiting Anesthetic complications: no   No notable events documented.  Last Vitals:  Vitals:   04/08/22 1045 04/08/22 1100  BP: (!) 170/83 (!) 140/83  Pulse: 67 68  Resp: 12 12  Temp:  36.6 C  SpO2: 98% 94%    Last Pain:  Vitals:   04/08/22 1100  TempSrc:   PainSc: 0-No pain                 Marthenia Rolling

## 2022-04-08 NOTE — TOC CM/SW Note (Signed)
Transition of Care [facility]) Screening Note  Patient Details  Name: Alexandria Mclaughlin Date of Birth: [birth year]  Transition of Care [facility]) CM/SW Contact:    Sherie Don, LCSW Phone Number: 04/08/2022, 11:26 AM  Transition of Care Department [facility]) has reviewed patient and no TOC needs have been identified at this time. We will continue to monitor patient advancement through interdisciplinary progression rounds. If new patient transition needs arise, please place a TOC consult.

## 2022-04-08 NOTE — Interval H&P Note (Signed)
History and Physical Interval Note:  04/08/2022 7:23 AM  Alexandria Mclaughlin  has presented today for surgery, with the diagnosis of COMPLEX ADNEXAL MASS,POSSIBLE CARCINOMATOSIS.  The various methods of treatment have been discussed with the patient and family. After consideration of risks, benefits and other options for treatment, the patient has consented to  Procedure(s): XI ROBOTIC ASSISTED BILATERAL SALPINGO OOPHORECTOMY, POSSIBLE MINI LAPAROTOMY, POSSIBLE STAGING;POSSIBLE DEBULKING (Bilateral) as a surgical intervention.  The patient's history has been reviewed, patient examined, no change in status, stable for surgery.  I have reviewed the patient's chart and labs.  Questions were answered to the patient's satisfaction.     Alfonza Toft

## 2022-04-09 ENCOUNTER — Telehealth: Payer: Self-pay

## 2022-04-09 ENCOUNTER — Encounter (HOSPITAL_COMMUNITY): Payer: Self-pay | Admitting: Psychiatry

## 2022-04-09 NOTE — Telephone Encounter (Signed)
Spoke with Ms. Monahan this morning. She states she is eating, drinking and urinating well. She has not had a BM. She is not passing gas. She is taking senokot-s as prescribed. Instructed her to increase the senokot-s  to 2 tablets twice a day. If  no BM by tomorrow morning, she needs to add 1 capful of Miralax bid. If no BM by Friday, she is to call the office for recommendations.  Encouraged her to drink plenty of water. She denies fever or chills. Incisions are dry and intact. She rates her pain 6/10. Her pain is controlled with Oxycodone. Told her that she could use heat or ice to abdomen for comfort as well.     Instructed to call office with any fever, chills, purulent drainage, uncontrolled pain or any other questions or concerns. Patient verbalizes understanding.   Ms Ealy would like to be call with the biopsy results when available.  She prefers not to wait until post op appointment on 04-21-22.Told her that this request will be given to Dr. Ernestina Patches.  Pt aware of post op appointments as well as the office number [phone] and after hours number [phone] to call if she has any questions or concerns

## 2022-04-10 ENCOUNTER — Telehealth: Payer: Self-pay | Admitting: Registered Nurse

## 2022-04-10 ENCOUNTER — Telehealth: Payer: Self-pay | Admitting: Psychiatry

## 2022-04-10 DIAGNOSIS — C561 Malignant neoplasm of right ovary: Secondary | ICD-10-CM

## 2022-04-10 LAB — CYTOLOGY - NON PAP

## 2022-04-10 LAB — SURGICAL PATHOLOGY

## 2022-04-10 NOTE — Telephone Encounter (Signed)
Called pt regarding her biopsy results.   Will get pt scheduled for a CT chest and work on arranging follow-up with Dr. Alvy Bimler.

## 2022-04-10 NOTE — Telephone Encounter (Signed)
Call placed to Alexandria Mclaughlin,  This provider spoke with [name] on 04/09/2022, regarding her My-Chart messages about depression. [name] was asked to explain her symptoms.  [name] admits to reading Dr Ernestina Patches notes, this is why her depression has increased  regarding the possibility of Cancer diagnosis, she denies any suicidal ideation. She will call her  PCP and insurance company to see where she can go for counseling. She has a scheduled appointment with Dr Ernestina Patches on 04/21/2021. She will wait on the Biopsy results prior to making any changes to her Cymbalta.Emotional support given.

## 2022-04-11 ENCOUNTER — Telehealth: Payer: Self-pay | Admitting: Oncology

## 2022-04-11 ENCOUNTER — Telehealth: Payer: Self-pay | Admitting: *Deleted

## 2022-04-11 NOTE — Telephone Encounter (Signed)
Per Dr Ernestina Patches, Patient scheduled for a CT chest on 2/23. Patient given date/time/location/instructions

## 2022-04-11 NOTE — Telephone Encounter (Signed)
Called Alexandria Mclaughlin and rescheduled her appointment with Dr. Ernestina Patches to 04/14/22 at 1:45.  Also scheduled new patient appointment with Dr. Alvy Bimler on 04/17/22 at 1:00 (arrival at 12:30).  She verbalized understanding and agreement of appointment date and times.

## 2022-04-14 ENCOUNTER — Encounter: Payer: Self-pay | Admitting: Psychiatry

## 2022-04-14 ENCOUNTER — Inpatient Hospital Stay: Payer: Medicare HMO | Attending: Psychiatry | Admitting: Psychiatry

## 2022-04-14 VITALS — BP 127/79 | HR 83 | Temp 98.5°F | Resp 14 | Ht 63.0 in | Wt 136.9 lb

## 2022-04-14 DIAGNOSIS — C541 Malignant neoplasm of endometrium: Secondary | ICD-10-CM | POA: Diagnosis not present

## 2022-04-14 DIAGNOSIS — G894 Chronic pain syndrome: Secondary | ICD-10-CM | POA: Diagnosis not present

## 2022-04-14 DIAGNOSIS — Z9889 Other specified postprocedural states: Secondary | ICD-10-CM

## 2022-04-14 DIAGNOSIS — T402X5A Adverse effect of other opioids, initial encounter: Secondary | ICD-10-CM | POA: Diagnosis not present

## 2022-04-14 DIAGNOSIS — E785 Hyperlipidemia, unspecified: Secondary | ICD-10-CM | POA: Diagnosis not present

## 2022-04-14 DIAGNOSIS — C569 Malignant neoplasm of unspecified ovary: Secondary | ICD-10-CM

## 2022-04-14 DIAGNOSIS — K219 Gastro-esophageal reflux disease without esophagitis: Secondary | ICD-10-CM | POA: Insufficient documentation

## 2022-04-14 DIAGNOSIS — R978 Other abnormal tumor markers: Secondary | ICD-10-CM

## 2022-04-14 DIAGNOSIS — Z79899 Other long term (current) drug therapy: Secondary | ICD-10-CM | POA: Insufficient documentation

## 2022-04-14 DIAGNOSIS — I2510 Atherosclerotic heart disease of native coronary artery without angina pectoris: Secondary | ICD-10-CM | POA: Insufficient documentation

## 2022-04-14 DIAGNOSIS — C786 Secondary malignant neoplasm of retroperitoneum and peritoneum: Secondary | ICD-10-CM | POA: Diagnosis not present

## 2022-04-14 DIAGNOSIS — K5903 Drug induced constipation: Secondary | ICD-10-CM | POA: Insufficient documentation

## 2022-04-14 DIAGNOSIS — Z8673 Personal history of transient ischemic attack (TIA), and cerebral infarction without residual deficits: Secondary | ICD-10-CM | POA: Insufficient documentation

## 2022-04-14 DIAGNOSIS — M5416 Radiculopathy, lumbar region: Secondary | ICD-10-CM | POA: Insufficient documentation

## 2022-04-14 DIAGNOSIS — Z7189 Other specified counseling: Secondary | ICD-10-CM

## 2022-04-14 DIAGNOSIS — K5900 Constipation, unspecified: Secondary | ICD-10-CM | POA: Diagnosis not present

## 2022-04-14 NOTE — Patient Instructions (Addendum)
It was a pleasure to see you in clinic today. - You have follow-up with Dr. Alvy Bimler on 2/22 - You have the CT scan of the chest on 2/23 - Recommend increasing your bowel regimen to include twice daily Miralax and use a suppository.  - I will see you back likely after 3 cycles of chemotherapy.  Thank you very much for allowing me to provide care for you today.  I appreciate your confidence in choosing our Gynecologic Oncology team at [facility].  If you have any questions about your visit today please call our office or send [place] a MyChart message and we will get back to you as soon as possible.

## 2022-04-14 NOTE — Progress Notes (Signed)
Gynecologic Oncology Return Clinic Visit  Date of Service: 04/14/2022 Referring Provider: Georgina Snell, MD   Assessment & Plan: Alexandria Mclaughlin is a 82 y.o. 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 Pt is 1 week s/p diagnostic laparoscopy with peritoneal and omental biopsies on 04/08/22.  Postop: - Pt recovering well from surgery and healing appropriately postoperatively - Ongoing postoperative expectations and precautions reviewed.   Ovarian cancer: - Pathology and intraoperative results reviewed in detail. - Reviewed that based on these results, she has what is consistent with advanced stage ovarian cancer. Was unable to visualize adnexal mass intraoperatively due to adhesions. - Recommend treatment with neoadjuvant systemic chemotherapy. - Reviewed general side effects and expectations of chemo. - Reviewed that we would plan likely 3 cycles of treatment and repeat CA125 (marker for her), imaging to evaluate for response. Would consider interval debulking surgery after 3C if good response. Discussed that in some situations, would consider additional cycles prior to surgery. - CT chest ordered to complete staging imaging. - Pt has appointment scheduled with Dr. Alvy Bimler to discuss and start neoadjuvant treatment.  - Information regarding cancer support services provided to pt.   Constipation: - Pt with constipation at baseline due to opioid use. - Recommend increasing miralax and use a dose of dulcolax suppository.  RTC after 3 cycles to review possible interval debulking procedure.  Bernadene Bell, MD Gynecologic Oncology   Medical Decision Making I personally spent  TOTAL 30 minutes face-to-face and non-face-to-face in the care of this patient, which includes all pre, intra, and post visit time on the date of  service.  ----------------------- Reason for Visit: Postop/Treatment counseling  Treatment History: Oncology History  Ovarian cancer ([place])  03/17/2022 Imaging   CT Abdomen/Pelvis: IMPRESSION: 1. Heterogeneous right adnexal mass measuring 3.8 x 3.0 cm with associated soft tissue thickening along the peritoneal reflections and greater omentum. Findings are highly suspicious for ovarian neoplasm with peritoneal carcinomatosis. 2. No evidence of bowel obstruction. 3. Coronary artery calcifications.   03/18/2022 Tumor Marker   CA125: 163   04/08/2022 Surgery   Diagnostic laparoscopy with peritoneal and omental biopsies  Findings: On entry to abdomen, omentum and large bowel broadly and densely adherent to the anterior abdominal wall in the midline from the level of the falciform ligament superiorly to the bladder inferiorly. Evidence of peritoneal plaques consistent with peritoneal carcinomatosis on the left diaphragm and left anterior abdominal wall and pelvic peritoneum. Right diaphragm and liver limited in visualization but no evidence of disease. Left liver lobe and stomach normal. Right abdomen not visualized due to the midline adhesions. Pelvic mass not visualized due to adhesions. Omentum adherent to the anterior abdominal wall consistent with omental cake. Small bowel and mesentery that is able to be visualized does not appear to have disease. Initial peritoneal biopsy indeterminate for carcinoma on frozen. Additional biopsies obtained.    04/08/2022 Initial Diagnosis   Ovarian cancer ([place])   04/08/2022 Pathology Results   A. PERITONEUM, BIOPSY:  Microscopic focus of adenocarcinoma.  See comment.   B. PERITONEUM, #2, BIOPSY:  Microscopic focus of adenocarcinoma.   C. OMENTUM, BIOPSY:  Adenocarcinoma.  See comment.   COMMENT:  A. The permanent recuts of the peritoneal biopsy submitted for frozen  section show a microscopic focus of adenocarcinoma which is not  identified in  the frozen section slides.   C.  The omental specimen shows adenocarcinoma and immunohistochemistry  will be performed and reported as an addendum.  The morphologic features  favor an ovarian primary.   ADDENDUM:  Immunohistochemistry shows the carcinoma is positive with PAX8 and p16 with patchy positive staining with WT1 and estrogen receptor.  Progesterone receptor is negative.  P53 shows diffuse strong positivity (mutated pattern).  The morphology and immunophenotype are consistent with high-grade serous carcinoma.    04/08/2022 Pathology Results   CYTOLOGY FINAL MICROSCOPIC DIAGNOSIS:  - Malignant cells consistent with adenocarcinoma  - See comment      Interval History: Pt reports that she is recovering well from surgery. She is doing well with controlling her pain with her home meds. She is eating and drinking well. She is voiding without issue. She does note constipation that is overall similar to her baseline. She uses senna BID, dulcolax daily and miralax daily. She has not had a bowel movement since Friday.     Past Medical/Surgical History: Past Medical History:  Diagnosis Date   Abnormality of gait 02/07/2015   Acquired hallux rigidus of left foot 12/17/2020   Acquired unequal leg length on left 08/08/2011   AMS (altered mental status) 12/28/2020   Biceps tendonitis on left 08/18/2018   BPPV (benign paroxysmal positional vertigo) 02/07/2015   Bunion 12/17/2020   Cerebrovascular disease    Cervical facet syndrome    Disorders of sacrum    Enthesopathy of hip region    Essential hypertension 02/07/2015   Generalized anxiety disorder    GERD (gastroesophageal reflux disease)    Headache    History of kidney stones    HLD (hyperlipidemia) 02/07/2015   Low back pain 02/07/2015   Lumbar facet arthropathy 07/05/2014   Lumbar post-laminectomy syndrome 06/11/2011   Lumbar radicular pain 07/29/2016   Lumbar radiculopathy 04/26/2018   Major depressive disorder    Mild  cognitive impairment of uncertain or unknown etiology 01/24/2022   Multiple lacunar infarcts    MRI - bilateral basal ganglia and left thalamus   Myoclonus 09/19/2015   Rotator cuff tendonitis, left 06/08/2018   Sacroiliac joint dysfunction 06/11/2011   Sciatic neuritis    Stroke ([place])    hx TIA   Subcortical infarction    2016 MRI - small chronic subcortical infarct with associated chronic hemosiderin deposition within the right superior frontal gyrus.   Synovitis and tenosynovitis    Therapeutic opioid induced constipation 07/25/2015   Transient left leg weakness    Vision abnormalities     Past Surgical History:  Procedure Laterality Date   ABDOMINAL HYSTERECTOMY     Due to fibroids   BACK SURGERY  2011   lumbar Fusion   BLADDER SURGERY  1985   CESAREAN SECTION     x2   EYE SURGERY Bilateral    Cataract surgery with IOL   FOOT SURGERY Right    "just scraped the bone"   LAPAROSCOPY N/A 04/08/2022   Procedure: LAPAROSCOPY DIAGNOSTIC WITH BIOPSIES;  Surgeon: Bernadene Bell, MD;  Location: [facility];  Service: Gynecology;  Laterality: N/A;   RETINAL DETACHMENT SURGERY Right 10/2011   TRANSFORAMINAL LUMBAR INTERBODY FUSION W/ MIS 1 LEVEL Right 04/26/2018   Procedure: Right Lumbar four-five Minimally invasive Transforaminal lumbar interbody fusion;  Surgeon: Judith Part, MD;  Location: [place];  Service: Neurosurgery;  Laterality: Right;    Family History  Problem Relation Age of Onset   Diabetes Mother    Heart disease Mother    Stroke Mother  Heart disease Father    Heart disease Brother    Colon cancer Neg Hx    Breast cancer Neg Hx    Ovarian cancer Neg Hx    Endometrial cancer Neg Hx    Pancreatic cancer Neg Hx    Prostate cancer Neg Hx     Social History   Socioeconomic History   Marital status: Widowed    Spouse name: Not on file   Number of children: Not on file   Years of education: 14   Highest education level: Associate degree: academic program   Occupational History   Occupation: Retired  Tobacco Use   Smoking status: Former    Packs/day: 0.50    Years: 5.00    Total pack years: 2.50    Types: Cigarettes    Quit date: 03/15/1968    Years since quitting: 54.1   Smokeless tobacco: Never  Vaping Use   Vaping Use: Never used  Substance and Sexual Activity   Alcohol use: Not Currently    Alcohol/week: 1.0 standard drink of alcohol    Types: 1 Glasses of wine per week    Comment: occasional glass of wine   Drug use: No   Sexual activity: Not Currently  Other Topics Concern   Not on file  Social History Narrative   Right handed   Drinks caffeine   Condo two story with Media pl[employer]   Social Determinants of Health   Financial Resource Strain: Not on file  Food Insecurity: Not on file  Transportation Needs: Not on file  Physical Activity: Not on file  Stress: Not on file  Social Connections: Not on file    Current Medications:  Current Outpatient Medications:    aspirin-acetaminophen-caffeine (EXCEDRIN MIGRAINE) 250-250-65 MG tablet, Take 1 tablet by mouth every 6 (six) hours as needed for headache or migraine., Disp: , Rfl:    Calcium Polycarbophil (FIBER-CAPS PO), Take 2 capsules by mouth daily., Disp: , Rfl:    Cholecalciferol (VITAMIN D) 50 MCG (2000 UT) CAPS, Take 2,000 Units by mouth daily., Disp: , Rfl:    clonazePAM (KLONOPIN) 0.5 MG tablet, TAKE 1 TABLET (0.5 MG TOTAL) BY MOUTH AT BEDTIME AS NEEDED FOR ANXIETY (SLEEP)., Disp: 30 tablet, Rfl: 2   cyanocobalamin 1000 MCG tablet, 1,000 mcg daily., Disp: , Rfl:    DULoxetine (CYMBALTA) 30 MG capsule, TAKE 1 CAPSULE BY MOUTH EVERY DAY, Disp: 90 capsule, Rfl: 2   fentaNYL (DURAGESIC) 25 MCG/HR, Place 1 patch onto the skin every 3 (three) days., Disp: 10 patch, Rfl: 0   gabapentin (NEURONTIN) 600 MG tablet, TAKE 1 TABLET BY MOUTH FOUR TIMES A DAY, Disp: 360 tablet, Rfl: 1   irbesartan (AVAPRO) 150 MG tablet, Take 150 mg by mouth daily., Disp: , Rfl:    Melatonin 10 MG  TABS, Take 10 mg by mouth at bedtime as needed (sleep)., Disp: , Rfl:    methimazole (TAPAZOLE) 5 MG tablet, Take 5 mg by mouth 2 (two) times a week., Disp: , Rfl:    ondansetron (ZOFRAN) 4 MG tablet, Take 4 mg by mouth 3 (three) times daily as needed., Disp: , Rfl:    oxyCODONE-acetaminophen (PERCOCET) 10-325 MG tablet, Take 1 tablet by mouth 5 (five) times daily as needed for pain., Disp: 150 tablet, Rfl: 0   rosuvastatin (CRESTOR) 20 MG tablet, Take 20 mg by mouth at bedtime., Disp: , Rfl:    traZODone (DESYREL) 100 MG tablet, TAKE 1 TABLET BY MOUTH EVERYDAY AT BEDTIME, Disp: 90 tablet, Rfl: 2  Review of Symptoms: Complete 10-system review includes the following: change in appetite, constipation, mouth sores, abdomianl pain  Physical Exam: BP 127/79 (BP Location: Left Arm, Patient Position: Sitting)   Pulse 83   Temp 98.5 F (36.9 C)   Resp 14   Ht 5' 3"$  (1.6 m)   Wt 136 lb 14.4 oz (62.1 kg)   SpO2 96%   BMI 24.25 kg/m  General: Alert, oriented, no acute distress. HEENT: Normocephalic, atraumatic. Neck symmetric without masses. Sclera anicteric.  Chest: Normal work of breathing. Clear to auscultation bilaterally.   Cardiovascular: Regular rate and rhythm, no murmurs. Abdomen: Soft, nontender.  Normoactive bowel sounds. Well-healing incisions with some surrounding bruising that resolving. Extremities: Grossly normal range of motion.  Warm, well perfused.  No edema bilaterally.  Laboratory & Radiologic Studies: FINAL MICROSCOPIC DIAGNOSIS:   A. PERITONEUM, BIOPSY:  Microscopic focus of adenocarcinoma.  See comment.   B. PERITONEUM, #2, BIOPSY:  Microscopic focus of adenocarcinoma.   C. OMENTUM, BIOPSY:  Adenocarcinoma.  See comment.   FINAL MICROSCOPIC DIAGNOSIS:  - Malignant cells consistent with adenocarcinoma  - See comment

## 2022-04-15 MED ORDER — OXYCODONE-ACETAMINOPHEN 10-325 MG PO TABS: 1.0000 | ORAL_TABLET | Freq: Four times a day (QID) | ORAL | 0 refills | Status: DC | PRN

## 2022-04-15 NOTE — Telephone Encounter (Signed)
Rx written and sent to the pharmacy. Thanks!

## 2022-04-17 ENCOUNTER — Encounter: Payer: Self-pay | Admitting: Hematology and Oncology

## 2022-04-17 ENCOUNTER — Other Ambulatory Visit: Payer: Self-pay

## 2022-04-17 ENCOUNTER — Inpatient Hospital Stay: Payer: Medicare HMO | Admitting: Hematology and Oncology

## 2022-04-17 VITALS — BP 121/69 | HR 81 | Temp 98.1°F | Resp 18 | Ht 63.0 in | Wt 138.4 lb

## 2022-04-17 DIAGNOSIS — G9341 Metabolic encephalopathy: Secondary | ICD-10-CM | POA: Diagnosis not present

## 2022-04-17 DIAGNOSIS — I2510 Atherosclerotic heart disease of native coronary artery without angina pectoris: Secondary | ICD-10-CM | POA: Diagnosis not present

## 2022-04-17 DIAGNOSIS — Z79899 Other long term (current) drug therapy: Secondary | ICD-10-CM | POA: Diagnosis not present

## 2022-04-17 DIAGNOSIS — G894 Chronic pain syndrome: Secondary | ICD-10-CM | POA: Diagnosis not present

## 2022-04-17 DIAGNOSIS — K5900 Constipation, unspecified: Secondary | ICD-10-CM | POA: Diagnosis not present

## 2022-04-17 DIAGNOSIS — M5416 Radiculopathy, lumbar region: Secondary | ICD-10-CM | POA: Diagnosis not present

## 2022-04-17 DIAGNOSIS — R262 Difficulty in walking, not elsewhere classified: Secondary | ICD-10-CM | POA: Diagnosis not present

## 2022-04-17 DIAGNOSIS — C541 Malignant neoplasm of endometrium: Secondary | ICD-10-CM | POA: Diagnosis not present

## 2022-04-17 DIAGNOSIS — K219 Gastro-esophageal reflux disease without esophagitis: Secondary | ICD-10-CM | POA: Diagnosis not present

## 2022-04-17 DIAGNOSIS — T402X5A Adverse effect of other opioids, initial encounter: Secondary | ICD-10-CM

## 2022-04-17 DIAGNOSIS — R29898 Other symptoms and signs involving the musculoskeletal system: Secondary | ICD-10-CM | POA: Diagnosis not present

## 2022-04-17 DIAGNOSIS — C569 Malignant neoplasm of unspecified ovary: Secondary | ICD-10-CM

## 2022-04-17 DIAGNOSIS — E785 Hyperlipidemia, unspecified: Secondary | ICD-10-CM | POA: Diagnosis not present

## 2022-04-17 DIAGNOSIS — Z8673 Personal history of transient ischemic attack (TIA), and cerebral infarction without residual deficits: Secondary | ICD-10-CM | POA: Diagnosis not present

## 2022-04-17 DIAGNOSIS — K5903 Drug induced constipation: Secondary | ICD-10-CM | POA: Diagnosis not present

## 2022-04-17 DIAGNOSIS — C786 Secondary malignant neoplasm of retroperitoneum and peritoneum: Secondary | ICD-10-CM | POA: Diagnosis not present

## 2022-04-17 NOTE — Assessment & Plan Note (Signed)
We discussed neoadjuvant chemotherapy approach to her disease CT imaging of her chest is pending tomorrow  I am concerned about her poor baseline functional status related to her chronic pain syndrome, baseline severe constipation and poor mobility She is already have persistent nerve damage at baseline requiring significant amount of pain medicine and gabapentin for symptom management Her pain is still poorly controlled even at baseline  We reviewed the NCCN guidelines We discussed the role of chemotherapy. The intent is of curative intent.  We discussed some of the risks, benefits, side-effects of carboplatin & Taxol. Treatment is intravenous, every 3 weeks x 6 cycles  Some of the short term side-effects included, though not limited to, including weight loss, life threatening infections, risk of allergic reactions, need for transfusions of blood products, nausea, vomiting, change in bowel habits, loss of hair, admission to hospital for various reasons, and risks of death.   Long term side-effects are also discussed including risks of infertility, permanent damage to nerve function, hearing loss, chronic fatigue, kidney damage with possibility needing hemodialysis, and rare secondary malignancy including bone marrow disorders.  The patient is aware that the response rates discussed earlier is not guaranteed.  After a long discussion, patient is undecided   Patient education material was dispensed. We discussed premedication with dexamethasone before chemotherapy. She will need port placement and chemo education class before we proceed with treatment On the other hand, we discussed prognosis with and without treatment We also discussed upfront significant dose adjustment of paclitaxel versus omission of paclitaxel altogether I will call her on Monday to get final decision from her

## 2022-04-17 NOTE — Assessment & Plan Note (Addendum)
She had significant chronic pain syndrome related to problems with her back Her pain is poorly controlled at baseline I anticipate she could have worsening pain control during chemotherapy She will continue to see her primary care doctor for medical management

## 2022-04-17 NOTE — Assessment & Plan Note (Signed)
She has severe constipation at baseline I recommend the patient to increase oral fluid intake, omit fiber supplement and to increase her laxative intake

## 2022-04-17 NOTE — Progress Notes (Signed)
[name] CONSULT NOTE  Patient Care Team: Merrilee Seashore, MD as PCP - General (Internal Medicine) Pieter Partridge, DO as Consulting Physician (Neurology)  ASSESSMENT & PLAN:  Ovarian cancer [facility]) We discussed neoadjuvant chemotherapy approach to her disease CT imaging of her chest is pending tomorrow  I am concerned about her poor baseline functional status related to her chronic pain syndrome, baseline severe constipation and poor mobility She is already have persistent nerve damage at baseline requiring significant amount of pain medicine and gabapentin for symptom management Her pain is still poorly controlled even at baseline  We reviewed the NCCN guidelines We discussed the role of chemotherapy. The intent is of curative intent.  We discussed some of the risks, benefits, side-effects of carboplatin & Taxol. Treatment is intravenous, every 3 weeks x 6 cycles  Some of the short term side-effects included, though not limited to, including weight loss, life threatening infections, risk of allergic reactions, need for transfusions of blood products, nausea, vomiting, change in bowel habits, loss of hair, admission to hospital for various reasons, and risks of death.   Long term side-effects are also discussed including risks of infertility, permanent damage to nerve function, hearing loss, chronic fatigue, kidney damage with possibility needing hemodialysis, and rare secondary malignancy including bone marrow disorders.  The patient is aware that the response rates discussed earlier is not guaranteed.  After a long discussion, patient is undecided   Patient education material was dispensed. We discussed premedication with dexamethasone before chemotherapy. She will need port placement and chemo education class before we proceed with treatment On the other hand, we discussed prognosis with and without treatment We also discussed upfront significant dose adjustment of  paclitaxel versus omission of paclitaxel altogether I will call her on Monday to get final decision from her   Lumbar radiculopathy She had significant chronic pain syndrome related to problems with her back Her pain is poorly controlled at baseline I anticipate she could have worsening pain control during chemotherapy She will continue to see her primary care doctor for medical management  Therapeutic opioid induced constipation She has severe constipation at baseline I recommend the patient to increase oral fluid intake, omit fiber supplement and to increase her laxative intake  No orders of the defined types were placed in this encounter.   The total time spent in the appointment was 60 minutes encounter with patients including review of chart and various tests results, discussions about plan of care and coordination of care plan   All questions were answered. The patient knows to call the clinic with any problems, questions or concerns. No barriers to learning was detected.  Heath Lark, MD 2/22/20241:43 PM  CHIEF COMPLAINTS/PURPOSE OF CONSULTATION:  High-grade serous ovarian cancer, for neoadjuvant treatment  HISTORY OF PRESENTING ILLNESS:  Alexandria Mclaughlin 82 y.o. female is here because of recent diagnosis of cancer The patient has chronic pain syndrome secondary to some traumatic injury to her back requiring multiple surgeries She is taking multiple different medications for chronic pain Most recently, she complained of right-sided pelvic pain leading to multiple investigation including imaging study, surgery and biopsy She is being referred here for consideration for neoadjuvant chemotherapy She continues to have chronic abdominal pain She is profoundly constipated and have not had bowel movement for 5 days despite laxatives At baseline, her right foot is numb and her back is causing a lot of pain She walks with a walker She has significant nerve pain from her back  I have  reviewed her chart and materials related to her cancer extensively and collaborated history with the patient. Summary of oncologic history is as follows: Oncology History Overview Note  High grade serous   Ovarian cancer ([place])  03/17/2022 Imaging   CT Abdomen/Pelvis: IMPRESSION: 1. Heterogeneous right adnexal mass measuring 3.8 x 3.0 cm with associated soft tissue thickening along the peritoneal reflections and greater omentum. Findings are highly suspicious for ovarian neoplasm with peritoneal carcinomatosis. 2. No evidence of bowel obstruction. 3. Coronary artery calcifications.   03/18/2022 Tumor Marker   CA125: 163   04/08/2022 Surgery   Diagnostic laparoscopy with peritoneal and omental biopsies  Findings: On entry to abdomen, omentum and large bowel broadly and densely adherent to the anterior abdominal wall in the midline from the level of the falciform ligament superiorly to the bladder inferiorly. Evidence of peritoneal plaques consistent with peritoneal carcinomatosis on the left diaphragm and left anterior abdominal wall and pelvic peritoneum. Right diaphragm and liver limited in visualization but no evidence of disease. Left liver lobe and stomach normal. Right abdomen not visualized due to the midline adhesions. Pelvic mass not visualized due to adhesions. Omentum adherent to the anterior abdominal wall consistent with omental cake. Small bowel and mesentery that is able to be visualized does not appear to have disease. Initial peritoneal biopsy indeterminate for carcinoma on frozen. Additional biopsies obtained.    04/08/2022 Initial Diagnosis   Ovarian cancer ([place])   04/08/2022 Pathology Results   A. PERITONEUM, BIOPSY:  Microscopic focus of adenocarcinoma.  See comment.   B. PERITONEUM, #2, BIOPSY:  Microscopic focus of adenocarcinoma.   C. OMENTUM, BIOPSY:  Adenocarcinoma.  See comment.   COMMENT:  A. The permanent recuts of the peritoneal biopsy submitted for frozen   section show a microscopic focus of adenocarcinoma which is not  identified in the frozen section slides.   C.  The omental specimen shows adenocarcinoma and immunohistochemistry  will be performed and reported as an addendum.  The morphologic features  favor an ovarian primary.   ADDENDUM:  Immunohistochemistry shows the carcinoma is positive with PAX8 and p16 with patchy positive staining with WT1 and estrogen receptor.  Progesterone receptor is negative.  P53 shows diffuse strong positivity (mutated pattern).  The morphology and immunophenotype are consistent with high-grade serous carcinoma.    04/08/2022 Pathology Results   CYTOLOGY FINAL MICROSCOPIC DIAGNOSIS:  - Malignant cells consistent with adenocarcinoma  - See comment    04/17/2022 Cancer Staging   Staging form: Ovary, Fallopian Tube, and Primary Peritoneal Carcinoma, AJCC 8th Edition - Clinical: FIGO Stage IIIC (cT3c, cN0, cM0) - Signed by Heath Lark, MD on 04/17/2022 Stage prefix: Initial diagnosis     MEDICAL HISTORY:  Past Medical History:  Diagnosis Date   Abnormality of gait 02/07/2015   Acquired hallux rigidus of left foot 12/17/2020   Acquired unequal leg length on left 08/08/2011   AMS (altered mental status) 12/28/2020   Biceps tendonitis on left 08/18/2018   BPPV (benign paroxysmal positional vertigo) 02/07/2015   Bunion 12/17/2020   Cerebrovascular disease    Cervical facet syndrome    Disorders of sacrum    Enthesopathy of hip region    Essential hypertension 02/07/2015   Generalized anxiety disorder    GERD (gastroesophageal reflux disease)    Headache    History of kidney stones    HLD (hyperlipidemia) 02/07/2015   Low back pain 02/07/2015   Lumbar facet arthropathy 07/05/2014   Lumbar post-laminectomy syndrome 06/11/2011  Lumbar radicular pain 07/29/2016   Lumbar radiculopathy 04/26/2018   Major depressive disorder    Mild cognitive impairment of uncertain or unknown etiology 01/24/2022    Multiple lacunar infarcts    MRI - bilateral basal ganglia and left thalamus   Myoclonus 09/19/2015   Rotator cuff tendonitis, left 06/08/2018   Sacroiliac joint dysfunction 06/11/2011   Sciatic neuritis    Stroke ([place])    hx TIA   Subcortical infarction    2016 MRI - small chronic subcortical infarct with associated chronic hemosiderin deposition within the right superior frontal gyrus.   Synovitis and tenosynovitis    Therapeutic opioid induced constipation 07/25/2015   Transient left leg weakness    Vision abnormalities     SURGICAL HISTORY: Past Surgical History:  Procedure Laterality Date   ABDOMINAL HYSTERECTOMY     Due to fibroids   BACK SURGERY  2011   lumbar Fusion   BLADDER SURGERY  1985   CESAREAN SECTION     x2   EYE SURGERY Bilateral    Cataract surgery with IOL   FOOT SURGERY Right    "just scraped the bone"   LAPAROSCOPY N/A 04/08/2022   Procedure: LAPAROSCOPY DIAGNOSTIC WITH BIOPSIES;  Surgeon: Bernadene Bell, MD;  Location: [facility];  Service: Gynecology;  Laterality: N/A;   RETINAL DETACHMENT SURGERY Right 10/2011   TRANSFORAMINAL LUMBAR INTERBODY FUSION W/ MIS 1 LEVEL Right 04/26/2018   Procedure: Right Lumbar four-five Minimally invasive Transforaminal lumbar interbody fusion;  Surgeon: Judith Part, MD;  Location: [place];  Service: Neurosurgery;  Laterality: Right;    SOCIAL HISTORY: Social History   Socioeconomic History   Marital status: Widowed    Spouse name: Not on file   Number of children: Not on file   Years of education: 14   Highest education level: Associate degree: academic program  Occupational History   Occupation: Retired  Tobacco Use   Smoking status: Former    Packs/day: 0.50    Years: 5.00    Total pack years: 2.50    Types: Cigarettes    Quit date: 03/15/1968    Years since quitting: 54.1   Smokeless tobacco: Never  Vaping Use   Vaping Use: Never used  Substance and Sexual Activity   Alcohol use: Not Currently     Alcohol/week: 1.0 standard drink of alcohol    Types: 1 Glasses of wine per week    Comment: occasional glass of wine   Drug use: No   Sexual activity: Not Currently  Other Topics Concern   Not on file  Social History Narrative   Right handed   Drinks caffeine   Condo two story with Media pl[employer]   Social Determinants of Health   Financial Resource Strain: Not on file  Food Insecurity: Not on file  Transportation Needs: Not on file  Physical Activity: Not on file  Stress: Not on file  Social Connections: Not on file  Intimate Partner Violence: Not on file    FAMILY HISTORY: Family History  Problem Relation Age of Onset   Diabetes Mother    Heart disease Mother    Stroke Mother    Heart disease Father    Heart disease Brother    Colon cancer Neg Hx    Breast cancer Neg Hx    Ovarian cancer Neg Hx    Endometrial cancer Neg Hx    Pancreatic cancer Neg Hx    Prostate cancer Neg Hx     ALLERGIES:  has No Known  Allergies.  MEDICATIONS:  Current Outpatient Medications  Medication Sig Dispense Refill   polyethylene glycol (MIRALAX / GLYCOLAX) 17 g packet Take 17 g by mouth daily.     senna (SENOKOT) 8.6 MG tablet Take 2 tablets by mouth daily.     aspirin-acetaminophen-caffeine (EXCEDRIN MIGRAINE) 250-250-65 MG tablet Take 1 tablet by mouth every 6 (six) hours as needed for headache or migraine.     Cholecalciferol (VITAMIN D) 50 MCG (2000 UT) CAPS Take 2,000 Units by mouth daily.     clonazePAM (KLONOPIN) 0.5 MG tablet TAKE 1 TABLET (0.5 MG TOTAL) BY MOUTH AT BEDTIME AS NEEDED FOR ANXIETY (SLEEP). 30 tablet 2   cyanocobalamin 1000 MCG tablet 1,000 mcg daily.     DULoxetine (CYMBALTA) 30 MG capsule TAKE 1 CAPSULE BY MOUTH EVERY DAY 90 capsule 2   fentaNYL (DURAGESIC) 25 MCG/HR Place 1 patch onto the skin every 3 (three) days. 10 patch 0   gabapentin (NEURONTIN) 600 MG tablet TAKE 1 TABLET BY MOUTH FOUR TIMES A DAY 360 tablet 1   irbesartan (AVAPRO) 150 MG tablet Take 150  mg by mouth daily.     Melatonin 10 MG TABS Take 10 mg by mouth at bedtime as needed (sleep).     methimazole (TAPAZOLE) 5 MG tablet Take 5 mg by mouth 2 (two) times a week.     oxyCODONE-acetaminophen (PERCOCET) 10-325 MG tablet Take 1 tablet by mouth every 6 (six) hours as needed for pain. 150 tablet 0   rosuvastatin (CRESTOR) 20 MG tablet Take 20 mg by mouth at bedtime.     traZODone (DESYREL) 100 MG tablet TAKE 1 TABLET BY MOUTH EVERYDAY AT BEDTIME 90 tablet 2   No current facility-administered medications for this visit.    REVIEW OF SYSTEMS:   Constitutional: Denies fevers, chills or abnormal night sweats Eyes: Denies blurriness of vision, double vision or watery eyes Ears, nose, mouth, throat, and face: Denies mucositis or sore throat Respiratory: Denies cough, dyspnea or wheezes Cardiovascular: Denies palpitation, chest discomfort or lower extremity swelling Skin: Denies abnormal skin rashes Lymphatics: Denies new lymphadenopathy or easy bruising Behavioral/Psych: Mood is stable, no new changes  All other systems were reviewed with the patient and are negative.  PHYSICAL EXAMINATION: ECOG PERFORMANCE STATUS: 2 - Symptomatic, <50% confined to bed  Vitals:   04/17/22 1228  BP: 121/69  Pulse: 81  Resp: 18  Temp: 98.1 F (36.7 C)  SpO2: 100%   Filed Weights   04/17/22 1228  Weight: 138 lb 6.4 oz (62.8 kg)    GENERAL:alert, no distress and comfortable SKIN: skin color, texture, turgor are normal, no rashes or significant lesions EYES: normal, conjunctiva are pink and non-injected, sclera clear OROPHARYNX:no exudate, no erythema and lips, buccal mucosa, and tongue normal  NECK: supple, thyroid normal size, non-tender, without nodularity LYMPH:  no palpable lymphadenopathy in the cervical, axillary or inguinal LUNGS: clear to auscultation and percussion with normal breathing effort HEART: regular rate & rhythm and no murmurs and no lower extremity edema ABDOMEN:abdomen  soft, non-tender and normal bowel sounds Musculoskeletal:no cyanosis of digits and no clubbing  PSYCH: alert & oriented x 3 with fluent speech NEURO: no focal motor/sensory deficits  LABORATORY DATA:  I have reviewed the data as listed Lab Results  Component Value Date   WBC 7.9 04/01/2022   HGB 12.7 04/01/2022   HCT 41.9 04/01/2022   MCV 92.9 04/01/2022   PLT 323 04/01/2022   Recent Labs    03/17/22 1258 03/17/22 1325  04/01/22 1437  NA 141 142 140  K 4.4 4.3 4.6  CL 103 102 103  CO2 28  --  30  GLUCOSE 113* 107* 101*  BUN 17 16 16  $ CREATININE 0.51 0.40* 0.55  CALCIUM 9.7  --  9.4  GFRNONAA >60  --  >60  PROT 8.1  --  7.2  ALBUMIN 4.2  --  3.6  AST 20  --  18  ALT 11  --  9  ALKPHOS 119  --  89  BILITOT 0.8  --  0.6   I have personally reviewed the CT imaging

## 2022-04-18 ENCOUNTER — Ambulatory Visit (HOSPITAL_COMMUNITY)
Admission: RE | Admit: 2022-04-18 | Discharge: 2022-04-18 | Disposition: A | Discharge status: Home / Self Care | Payer: Medicare HMO | Source: Ambulatory Visit | Attending: Psychiatry | Admitting: Psychiatry

## 2022-04-18 DIAGNOSIS — C561 Malignant neoplasm of right ovary: Secondary | ICD-10-CM

## 2022-04-18 DIAGNOSIS — R918 Other nonspecific abnormal finding of lung field: Secondary | ICD-10-CM | POA: Diagnosis not present

## 2022-04-18 MED ORDER — SODIUM CHLORIDE (PF) 0.9 % IJ SOLN
INTRAMUSCULAR | Status: AC
  Filled 2022-04-18: qty 50

## 2022-04-18 MED ORDER — IOHEXOL 300 MG/ML  SOLN
75.0000 mL | Freq: Once | INTRAMUSCULAR | Status: AC | PRN
  Administered 2022-04-18: 75 mL via INTRAVENOUS

## 2022-04-21 ENCOUNTER — Other Ambulatory Visit: Payer: Self-pay | Admitting: *Deleted

## 2022-04-21 ENCOUNTER — Inpatient Hospital Stay: Payer: Medicare HMO | Admitting: Psychiatry

## 2022-04-21 NOTE — Progress Notes (Signed)
The proposed treatment discussed in conference is for discussion purpose only and is not a binding recommendation.  The patients have not been physically examined, or presented with their treatment options.  Therefore, final treatment plans cannot be decided.  

## 2022-04-22 ENCOUNTER — Encounter: Payer: Self-pay | Admitting: Oncology

## 2022-04-22 ENCOUNTER — Encounter: Payer: Self-pay | Admitting: Hematology and Oncology

## 2022-04-22 ENCOUNTER — Inpatient Hospital Stay (HOSPITAL_BASED_OUTPATIENT_CLINIC_OR_DEPARTMENT_OTHER): Payer: Medicare HMO | Admitting: Hematology and Oncology

## 2022-04-22 ENCOUNTER — Telehealth: Payer: Self-pay | Admitting: Oncology

## 2022-04-22 ENCOUNTER — Telehealth: Payer: Self-pay | Admitting: Hematology and Oncology

## 2022-04-22 VITALS — BP 140/82 | HR 93 | Temp 98.1°F | Resp 18 | Ht 63.0 in | Wt 139.5 lb

## 2022-04-22 DIAGNOSIS — C786 Secondary malignant neoplasm of retroperitoneum and peritoneum: Secondary | ICD-10-CM | POA: Diagnosis not present

## 2022-04-22 DIAGNOSIS — K5903 Drug induced constipation: Secondary | ICD-10-CM

## 2022-04-22 DIAGNOSIS — K5900 Constipation, unspecified: Secondary | ICD-10-CM | POA: Diagnosis not present

## 2022-04-22 DIAGNOSIS — C541 Malignant neoplasm of endometrium: Secondary | ICD-10-CM | POA: Diagnosis not present

## 2022-04-22 DIAGNOSIS — T402X5A Adverse effect of other opioids, initial encounter: Secondary | ICD-10-CM | POA: Diagnosis not present

## 2022-04-22 DIAGNOSIS — C569 Malignant neoplasm of unspecified ovary: Secondary | ICD-10-CM

## 2022-04-22 DIAGNOSIS — M5416 Radiculopathy, lumbar region: Secondary | ICD-10-CM | POA: Diagnosis not present

## 2022-04-22 DIAGNOSIS — I2510 Atherosclerotic heart disease of native coronary artery without angina pectoris: Secondary | ICD-10-CM | POA: Diagnosis not present

## 2022-04-22 DIAGNOSIS — Z79899 Other long term (current) drug therapy: Secondary | ICD-10-CM | POA: Diagnosis not present

## 2022-04-22 DIAGNOSIS — G894 Chronic pain syndrome: Secondary | ICD-10-CM | POA: Diagnosis not present

## 2022-04-22 DIAGNOSIS — E785 Hyperlipidemia, unspecified: Secondary | ICD-10-CM | POA: Diagnosis not present

## 2022-04-22 DIAGNOSIS — Z8673 Personal history of transient ischemic attack (TIA), and cerebral infarction without residual deficits: Secondary | ICD-10-CM | POA: Diagnosis not present

## 2022-04-22 DIAGNOSIS — K219 Gastro-esophageal reflux disease without esophagitis: Secondary | ICD-10-CM | POA: Diagnosis not present

## 2022-04-22 MED ORDER — LACTULOSE 10 GM/15ML PO SOLN: 10.0000 g | Freq: Three times a day (TID) | ORAL | 1 refills | Status: DC

## 2022-04-22 MED ORDER — PROCHLORPERAZINE MALEATE 10 MG PO TABS: 10.0000 mg | ORAL_TABLET | Freq: Four times a day (QID) | ORAL | 1 refills | Status: DC | PRN

## 2022-04-22 MED ORDER — DEXAMETHASONE 4 MG PO TABS: ORAL_TABLET | ORAL | 6 refills | Status: DC

## 2022-04-22 MED ORDER — MAGNESIUM OXIDE -MG SUPPLEMENT 400 (240 MG) MG PO TABS: 400.0000 mg | ORAL_TABLET | Freq: Every day | ORAL | 1 refills | Status: DC

## 2022-04-22 MED ORDER — LIDOCAINE-PRILOCAINE 2.5-2.5 % EX CREA: TOPICAL_CREAM | CUTANEOUS | 3 refills | Status: DC

## 2022-04-22 MED ORDER — ONDANSETRON HCL 8 MG PO TABS: 8.0000 mg | ORAL_TABLET | Freq: Three times a day (TID) | ORAL | 1 refills | Status: DC | PRN

## 2022-04-22 NOTE — Telephone Encounter (Signed)
Called Alexandria Mclaughlin and advised that there are not any port placement appointments available before she will be starting chemo on 04/30/22.  Discussed that her first chemo will be through a peripheral IV and that we will schedule the port closer to her 2nd cycle of chemo on 05/29/22.  Also went over her appointments for 04/29/22 and advised that her port flush/lab appointment has been changed to a lab appointment only since she will not have her port yet.  She verbalized understanding and agreement.

## 2022-04-22 NOTE — Assessment & Plan Note (Signed)
She continues to struggle with severe constipation We discussed importance of adequate fluid hydration She is already on twice daily Senokot and MiraLAX I will add magnesium supplement and lactulose

## 2022-04-22 NOTE — Assessment & Plan Note (Signed)
Case was reviewed at the GYN oncology tumor board I discussed the risk, benefits, side effects of chemotherapy with the patient and her son  We reviewed the NCCN guidelines We discussed the role of chemotherapy. The intent is of curative intent.  We discussed some of the risks, benefits, side-effects of carboplatin & Taxol. Treatment is intravenous, every 3 weeks x 6 cycles  Some of the short term side-effects included, though not limited to, including weight loss, life threatening infections, risk of allergic reactions, need for transfusions of blood products, nausea, vomiting, change in bowel habits, loss of hair, admission to hospital for various reasons, and risks of death.   Long term side-effects are also discussed including risks of infertility, permanent damage to nerve function, hearing loss, chronic fatigue, kidney damage with possibility needing hemodialysis, and rare secondary malignancy including bone marrow disorders.  The patient is aware that the response rates discussed earlier is not guaranteed.  After a long discussion, patient made an informed decision to proceed with the prescribed plan of care.   Patient education material was dispensed. We discussed premedication with dexamethasone before chemotherapy. We discussed neoadjuvant chemotherapy approach with chemotherapy for 3 cycles before repeating CT imaging and further assessment whether she might be a candidate for interval debulking surgery In general, most patients would require 6 cycles of chemotherapy Due to her pre-existing peripheral neuropathy, I recommend upfront dose adjustment with 50% reduction of the dose of paclitaxel I will schedule chemo education class and port placement and blood work next week I will see her prior to cycle 2 of therapy

## 2022-04-22 NOTE — Progress Notes (Signed)
START ON PATHWAY REGIMEN - Ovarian     A cycle is every 21 days:     Paclitaxel      Carboplatin   **Always confirm dose/schedule in your pharmacy ordering system**  Patient Characteristics: Preoperative or Nonsurgical Candidate (Clinical Staging), Newly Diagnosed, Neoadjuvant Therapy followed by Surgery BRCA Mutation Status: Awaiting Test Results Therapeutic Status: Preoperative or Nonsurgical Candidate (Clinical Staging) AJCC T Category: cT3 AJCC 8 Stage Grouping: Unknown AJCC N Category: cN0 AJCC M Category: cM0 Therapy Plan: Neoadjuvant Therapy followed by Surgery Intent of Therapy: Curative Intent, Discussed with Patient 

## 2022-04-22 NOTE — Telephone Encounter (Signed)
Spoke with patient confirming all upcoming appointments  

## 2022-04-22 NOTE — Progress Notes (Signed)
[facility] OFFICE PROGRESS NOTE  Patient Care Team: Merrilee Seashore, MD as PCP - General (Internal Medicine) Pieter Partridge, DO as Consulting Physician (Neurology)  ASSESSMENT & PLAN:  Ovarian cancer [facility]) Case was reviewed at the GYN oncology tumor board I discussed the risk, benefits, side effects of chemotherapy with the patient and her son  We reviewed the NCCN guidelines We discussed the role of chemotherapy. The intent is of curative intent.  We discussed some of the risks, benefits, side-effects of carboplatin & Taxol. Treatment is intravenous, every 3 weeks x 6 cycles  Some of the short term side-effects included, though not limited to, including weight loss, life threatening infections, risk of allergic reactions, need for transfusions of blood products, nausea, vomiting, change in bowel habits, loss of hair, admission to hospital for various reasons, and risks of death.   Long term side-effects are also discussed including risks of infertility, permanent damage to nerve function, hearing loss, chronic fatigue, kidney damage with possibility needing hemodialysis, and rare secondary malignancy including bone marrow disorders.  The patient is aware that the response rates discussed earlier is not guaranteed.  After a long discussion, patient made an informed decision to proceed with the prescribed plan of care.   Patient education material was dispensed. We discussed premedication with dexamethasone before chemotherapy. We discussed neoadjuvant chemotherapy approach with chemotherapy for 3 cycles before repeating CT imaging and further assessment whether she might be a candidate for interval debulking surgery In general, most patients would require 6 cycles of chemotherapy Due to her pre-existing peripheral neuropathy, I recommend upfront dose adjustment with 50% reduction of the dose of paclitaxel I will schedule chemo education class and port placement and blood  work next week I will see her prior to cycle 2 of therapy  Therapeutic opioid induced constipation She continues to struggle with severe constipation We discussed importance of adequate fluid hydration She is already on twice daily Senokot and MiraLAX I will add magnesium supplement and lactulose  Orders Placed This Encounter  Procedures   CBC with Differential ([place] Only)    Standing Status:   Future    Standing Expiration Date:   05/01/2023   CMP ([place] only)    Standing Status:   Future    Standing Expiration Date:   05/01/2023   CBC with Differential ([place] Only)    Standing Status:   Future    Standing Expiration Date:   05/30/2023   CMP ([place] only)    Standing Status:   Future    Standing Expiration Date:   05/30/2023   CBC with Differential ([place] Only)    Standing Status:   Future    Standing Expiration Date:   06/20/2023   CMP ([place] only)    Standing Status:   Future    Standing Expiration Date:   06/20/2023   CBC with Differential ([place] Only)    Standing Status:   Future    Standing Expiration Date:   07/11/2023   CMP ([place] only)    Standing Status:   Future    Standing Expiration Date:   07/11/2023   CBC with Differential ([place] Only)    Standing Status:   Future    Standing Expiration Date:   08/01/2023   CMP ([place] only)    Standing Status:   Future    Standing Expiration Date:   08/01/2023   CBC with Differential ([place] Only)  Standing Status:   Future    Standing Expiration Date:   08/22/2023   CMP ([place] only)    Standing Status:   Future    Standing Expiration Date:   08/22/2023    All questions were answered. The patient knows to call the clinic with any problems, questions or concerns. The total time spent in the appointment was 40 minutes encounter with patients including review of chart and various tests results, discussions about plan of care and coordination of  care plan   Heath Lark, MD 04/22/2022 2:16 PM  INTERVAL HISTORY: Please see below for problem oriented charting. she returns for treatment follow-up with her son We discussed recent recommendation from GYN oncology tumor board She is willing to proceed with reduced dose chemotherapy She continues to struggle with constipation, her last bowel movement was over 3 days ago She is fairly uncomfortable with intermittent abdominal pain Her chronic neuropathic pain is stable  REVIEW OF SYSTEMS:   Constitutional: Denies fevers, chills or abnormal weight loss Eyes: Denies blurriness of vision Ears, nose, mouth, throat, and face: Denies mucositis or sore throat Respiratory: Denies cough, dyspnea or wheezes Cardiovascular: Denies palpitation, chest discomfort or lower extremity swelling Skin: Denies abnormal skin rashes Lymphatics: Denies new lymphadenopathy or easy bruising Neurological:Denies numbness, tingling or new weaknesses Behavioral/Psych: Mood is stable, no new changes  All other systems were reviewed with the patient and are negative.  I have reviewed the past medical history, past surgical history, social history and family history with the patient and they are unchanged from previous note.  ALLERGIES:  has No Known Allergies.  MEDICATIONS:  Current Outpatient Medications  Medication Sig Dispense Refill   dexamethasone (DECADRON) 4 MG tablet Take 2 tabs at the night before and 2 tab the morning of chemotherapy, every 3 weeks, by mouth x 6 cycles 24 tablet 6   lactulose (CHRONULAC) 10 GM/15ML solution Take 15 mLs (10 g total) by mouth 3 (three) times daily. 473 mL 1   magnesium oxide (MAG-OX) 400 (240 Mg) MG tablet Take 1 tablet (400 mg total) by mouth daily. 30 tablet 1   aspirin-acetaminophen-caffeine (EXCEDRIN MIGRAINE) [id number] MG tablet Take 1 tablet by mouth every 6 (six) hours as needed for headache or migraine.     Cholecalciferol (VITAMIN D) 50 MCG (2000 UT) CAPS Take  2,000 Units by mouth daily.     clonazePAM (KLONOPIN) 0.5 MG tablet TAKE 1 TABLET (0.5 MG TOTAL) BY MOUTH AT BEDTIME AS NEEDED FOR ANXIETY (SLEEP). 30 tablet 2   cyanocobalamin 1000 MCG tablet 1,000 mcg daily.     DULoxetine (CYMBALTA) 30 MG capsule TAKE 1 CAPSULE BY MOUTH EVERY DAY 90 capsule 2   fentaNYL (DURAGESIC) 25 MCG/HR Place 1 patch onto the skin every 3 (three) days. 10 patch 0   gabapentin (NEURONTIN) 600 MG tablet TAKE 1 TABLET BY MOUTH FOUR TIMES A DAY 360 tablet 1   irbesartan (AVAPRO) 150 MG tablet Take 150 mg by mouth daily.     lidocaine-prilocaine (EMLA) cream Apply to affected area once 30 g 3   Melatonin 10 MG TABS Take 10 mg by mouth at bedtime as needed (sleep).     methimazole (TAPAZOLE) 5 MG tablet Take 5 mg by mouth 2 (two) times a week.     ondansetron (ZOFRAN) 8 MG tablet Take 1 tablet (8 mg total) by mouth every 8 (eight) hours as needed for nausea or vomiting. Start on the third day after carboplatin. 30 tablet 1  oxyCODONE-acetaminophen (PERCOCET) 10-325 MG tablet Take 1 tablet by mouth every 6 (six) hours as needed for pain. 150 tablet 0   polyethylene glycol (MIRALAX / GLYCOLAX) 17 g packet Take 17 g by mouth 2 (two) times daily.     prochlorperazine (COMPAZINE) 10 MG tablet Take 1 tablet (10 mg total) by mouth every 6 (six) hours as needed for nausea or vomiting. 30 tablet 1   rosuvastatin (CRESTOR) 20 MG tablet Take 20 mg by mouth at bedtime.     senna (SENOKOT) 8.6 MG tablet Take 2 tablets by mouth 2 (two) times daily.     traZODone (DESYREL) 100 MG tablet TAKE 1 TABLET BY MOUTH EVERYDAY AT BEDTIME 90 tablet 2   No current facility-administered medications for this visit.    SUMMARY OF ONCOLOGIC HISTORY: Oncology History Overview Note  High grade serous   Ovarian cancer ([place])  03/17/2022 Imaging   CT Abdomen/Pelvis: IMPRESSION: 1. Heterogeneous right adnexal mass measuring 3.8 x 3.0 cm with associated soft tissue thickening along the peritoneal  reflections and greater omentum. Findings are highly suspicious for ovarian neoplasm with peritoneal carcinomatosis. 2. No evidence of bowel obstruction. 3. Coronary artery calcifications.   03/18/2022 Tumor Marker   CA125: 163   04/08/2022 Surgery   Diagnostic laparoscopy with peritoneal and omental biopsies  Findings: On entry to abdomen, omentum and large bowel broadly and densely adherent to the anterior abdominal wall in the midline from the level of the falciform ligament superiorly to the bladder inferiorly. Evidence of peritoneal plaques consistent with peritoneal carcinomatosis on the left diaphragm and left anterior abdominal wall and pelvic peritoneum. Right diaphragm and liver limited in visualization but no evidence of disease. Left liver lobe and stomach normal. Right abdomen not visualized due to the midline adhesions. Pelvic mass not visualized due to adhesions. Omentum adherent to the anterior abdominal wall consistent with omental cake. Small bowel and mesentery that is able to be visualized does not appear to have disease. Initial peritoneal biopsy indeterminate for carcinoma on frozen. Additional biopsies obtained.    04/08/2022 Initial Diagnosis   Ovarian cancer ([place])   04/08/2022 Pathology Results   A. PERITONEUM, BIOPSY:  Microscopic focus of adenocarcinoma.  See comment.   B. PERITONEUM, #2, BIOPSY:  Microscopic focus of adenocarcinoma.   C. OMENTUM, BIOPSY:  Adenocarcinoma.  See comment.   COMMENT:  A. The permanent recuts of the peritoneal biopsy submitted for frozen  section show a microscopic focus of adenocarcinoma which is not  identified in the frozen section slides.   C.  The omental specimen shows adenocarcinoma and immunohistochemistry  will be performed and reported as an addendum.  The morphologic features  favor an ovarian primary.   ADDENDUM:  Immunohistochemistry shows the carcinoma is positive with PAX8 and p16 with patchy positive staining  with WT1 and estrogen receptor.  Progesterone receptor is negative.  P53 shows diffuse strong positivity (mutated pattern).  The morphology and immunophenotype are consistent with high-grade serous carcinoma.    04/08/2022 Pathology Results   CYTOLOGY FINAL MICROSCOPIC DIAGNOSIS:  - Malignant cells consistent with adenocarcinoma  - See comment    04/17/2022 Cancer Staging   Staging form: Ovary, Fallopian Tube, and Primary Peritoneal Carcinoma, AJCC 8th Edition - Clinical: FIGO Stage IIIC (cT3c, cN0, cM0) - Signed by Heath Lark, MD on 04/17/2022 Stage prefix: Initial diagnosis   04/30/2022 -  Chemotherapy   Patient is on Treatment Plan : OVARIAN Carboplatin (AUC 6) + Paclitaxel (175) q21d X 6 Cycles  PHYSICAL EXAMINATION: ECOG PERFORMANCE STATUS: 2 - Symptomatic, <50% confined to bed  Vitals:   04/22/22 1253  BP: (!) 140/82  Pulse: 93  Resp: 18  Temp: 98.1 F (36.7 C)  SpO2: 95%   Filed Weights   04/22/22 1253  Weight: 139 lb 8 oz (63.3 kg)    GENERAL:alert, no distress and comfortable  NEURO: alert & oriented x 3 with fluent speech, no focal motor/sensory deficits  LABORATORY DATA:  I have reviewed the data as listed    Component Value Date/Time   NA 140 04/01/2022 1437   NA 139 09/19/2015 1422   K 4.6 04/01/2022 1437   CL 103 04/01/2022 1437   CO2 30 04/01/2022 1437   GLUCOSE 101 (H) 04/01/2022 1437   BUN 16 04/01/2022 1437   BUN 20 09/19/2015 1422   CREATININE 0.55 04/01/2022 1437   CALCIUM 9.4 04/01/2022 1437   PROT 7.2 04/01/2022 1437   PROT 7.0 09/19/2015 1422   ALBUMIN 3.6 04/01/2022 1437   ALBUMIN 4.6 09/19/2015 1422   AST 18 04/01/2022 1437   ALT 9 04/01/2022 1437   ALKPHOS 89 04/01/2022 1437   BILITOT 0.6 04/01/2022 1437   BILITOT 0.7 09/19/2015 1422   GFRNONAA >60 04/01/2022 1437   GFRAA >60 04/20/2018 1452    No results found for: "SPEP", "UPEP"  Lab Results  Component Value Date   WBC 7.9 04/01/2022   NEUTROABS 5.4 03/17/2022    HGB 12.7 04/01/2022   HCT 41.9 04/01/2022   MCV 92.9 04/01/2022   PLT 323 04/01/2022      Chemistry      Component Value Date/Time   NA 140 04/01/2022 1437   NA 139 09/19/2015 1422   K 4.6 04/01/2022 1437   CL 103 04/01/2022 1437   CO2 30 04/01/2022 1437   BUN 16 04/01/2022 1437   BUN 20 09/19/2015 1422   CREATININE 0.55 04/01/2022 1437      Component Value Date/Time   CALCIUM 9.4 04/01/2022 1437   ALKPHOS 89 04/01/2022 1437   AST 18 04/01/2022 1437   ALT 9 04/01/2022 1437   BILITOT 0.6 04/01/2022 1437   BILITOT 0.7 09/19/2015 1422       RADIOGRAPHIC STUDIES: I have personally reviewed the radiological images as listed and agreed with the findings in the report. CT Chest W Contrast  Result Date: 04/21/2022 CLINICAL DATA:  Right ovarian cancer, for staging EXAM: CT CHEST WITH CONTRAST TECHNIQUE: Multidetector CT imaging of the chest was performed during intravenous contrast administration. RADIATION DOSE REDUCTION: This exam was performed according to the departmental dose-optimization program which includes automated exposure control, adjustment of the mA and/or kV according to patient size and/or use of iterative reconstruction technique. CONTRAST:  7m OMNIPAQUE IOHEXOL 300 MG/ML  SOLN COMPARISON:  11/10/2014 FINDINGS: Cardiovascular: The heart is normal in size. No pericardial effusion. Severe three-vessel coronary atherosclerosis. No evidence of thoracic aortic aneurysm. Atherosclerotic calcifications of the aortic arch. Mediastinum/Nodes: No suspicious mediastinal lymphadenopathy. Visualized thyroid is unremarkable. Lungs/Pleura: Mild biapical pleural-parenchymal scarring. Mild centrilobular and paraseptal emphysematous changes, upper lung predominant. Mild subpleural reticulation/fibrosis in the lower lungs bilaterally. Scattered small bilateral pulmonary nodules, lower lobe predominant, measuring up to 6 mm (series 5/images 56, 83, 99, 101, 104, 118, 121-122, and 130).  Dominant nodules are unchanged from the remote prior, while smaller nodules were not visualized, likely due to technique/slice thickness. Specifically, some small left upper lobe/lingular nodules measuring up to 4 mm are not conspicuous on the prior (series 5/images 83,  99, 101, and 104), while a 5 mm subpleural nodule in the left lower lobe was also not well visualized on the prior (series 5/image 122), but appears benign. No focal consolidation. No pleural effusion or pneumothorax. Upper Abdomen: Visualized upper abdomen is grossly unremarkable, noting vascular calcifications and early excretory contrast in the bilateral renal collecting systems. Musculoskeletal: Mild superior endplate Schmorl's node deformity at T2. Mild loss of height with superior endplate Schmorl's node deformity at T9, unchanged. Mild superior endplate compression fracture deformity at L1, unchanged. IMPRESSION: Scattered small bilateral pulmonary nodules, measuring up to 6 mm, as described above. Dominant nodules are unchanged from remote prior, benign. Smaller nodules were not conspicuous, likely due to differences in technique/slice thickness. No findings suspicious for metastatic disease in the chest. Consider a single follow-up CT chest in 6 months, as clinically warranted. Aortic Atherosclerosis (ICD10-I70.0) and Emphysema (ICD10-J43.9). Electronically Signed   By: Julian Hy M.D.   On: 04/21/2022 00:57

## 2022-04-23 ENCOUNTER — Telehealth: Payer: Self-pay | Admitting: Oncology

## 2022-04-23 ENCOUNTER — Other Ambulatory Visit: Payer: Self-pay

## 2022-04-23 NOTE — Progress Notes (Signed)
Pharmacist Chemotherapy Monitoring - Initial Assessment    Anticipated start date: 04/30/22   The following has been reviewed per standard work regarding the patient's treatment regimen: The patient's diagnosis, treatment plan and drug doses, and organ/hematologic function Lab orders and baseline tests specific to treatment regimen  The treatment plan start date, drug sequencing, and pre-medications Prior authorization status  Patient's documented medication list, including drug-drug interaction screen and prescriptions for anti-emetics and supportive care specific to the treatment regimen The drug concentrations, fluid compatibility, administration routes, and timing of the medications to be used The patient's access for treatment and lifetime cumulative dose history, if applicable  The patient's medication allergies and previous infusion related reactions, if applicable   Changes made to treatment plan:  N/A  Follow up needed:  Pending authorization for treatment    Kennith Center, Pharm.D., CPP 04/23/2022'@12'$ :43 PM

## 2022-04-23 NOTE — Telephone Encounter (Signed)
Called Alexandria Mclaughlin and advised her of port placement appointment on 05/26/22 with arrival at [facility] at 9:30.  Gave her instructions for NPO after midnight and that she will need a driver.  She verbalized understanding and agreement of appointment and instructions.

## 2022-04-24 ENCOUNTER — Telehealth: Payer: Self-pay | Admitting: Genetic Counselor

## 2022-04-24 ENCOUNTER — Encounter: Payer: Medicare HMO | Attending: Physical Medicine and Rehabilitation | Admitting: Registered Nurse

## 2022-04-24 ENCOUNTER — Encounter: Payer: Self-pay | Admitting: Registered Nurse

## 2022-04-24 ENCOUNTER — Encounter: Payer: Self-pay | Admitting: Oncology

## 2022-04-24 VITALS — BP 112/75 | HR 76 | Ht 63.0 in | Wt 136.0 lb

## 2022-04-24 DIAGNOSIS — G253 Myoclonus: Secondary | ICD-10-CM | POA: Diagnosis not present

## 2022-04-24 DIAGNOSIS — C561 Malignant neoplasm of right ovary: Secondary | ICD-10-CM

## 2022-04-24 DIAGNOSIS — Z79891 Long term (current) use of opiate analgesic: Secondary | ICD-10-CM | POA: Insufficient documentation

## 2022-04-24 DIAGNOSIS — G894 Chronic pain syndrome: Secondary | ICD-10-CM | POA: Insufficient documentation

## 2022-04-24 DIAGNOSIS — M47816 Spondylosis without myelopathy or radiculopathy, lumbar region: Secondary | ICD-10-CM | POA: Insufficient documentation

## 2022-04-24 DIAGNOSIS — R269 Unspecified abnormalities of gait and mobility: Secondary | ICD-10-CM

## 2022-04-24 DIAGNOSIS — Z5181 Encounter for therapeutic drug level monitoring: Secondary | ICD-10-CM | POA: Diagnosis not present

## 2022-04-24 MED ORDER — FENTANYL 25 MCG/HR TD PT72: 1.0000 | MEDICATED_PATCH | TRANSDERMAL | 0 refills | Status: DC

## 2022-04-24 MED ORDER — OXYCODONE-ACETAMINOPHEN 10-325 MG PO TABS: 1.0000 | ORAL_TABLET | Freq: Four times a day (QID) | ORAL | 0 refills | Status: DC | PRN

## 2022-04-24 NOTE — Progress Notes (Signed)
Subjective:    Patient ID: Alexandria Mclaughlin, female    DOB: [birth year], 82 y.o.   MRN: [id number]  HPI: Alexandria Mclaughlin is a 82 y.o. female who returns for follow up appointment for chronic pain and medication refill. She states her pain is located in her lower back. She also reports Right lower abdominal pain and oncologist following. She rates her pain 6. Her current exercise regime is walking with her walker.  [name] states she was diagnosed with ovarian cancer, oncology following.Emotional support given.   [name] Morphine equivalent is 135.00 MME.   Oral Swab ordered today.      Pain Inventory Average Pain 7 Pain Right Now 6 My pain is constant, sharp, and stabbing  In the last 24 hours, has pain interfered with the following? General activity 9 Relation with others 7 Enjoyment of life 9 What TIME of day is your pain at its worst? morning  Sleep (in general) Good  Pain is worse with: walking, bending, and standing Pain improves with: rest, heat/ice, and medication Relief from Meds: 7  Family History  Problem Relation Age of Onset   Diabetes Mother    Heart disease Mother    Stroke Mother    Heart disease Father    Heart disease Brother    Colon cancer Neg Hx    Breast cancer Neg Hx    Ovarian cancer Neg Hx    Endometrial cancer Neg Hx    Pancreatic cancer Neg Hx    Prostate cancer Neg Hx    Social History   Socioeconomic History   Marital status: Widowed    Spouse name: Not on file   Number of children: Not on file   Years of education: 14   Highest education level: Associate degree: academic program  Occupational History   Occupation: Retired  Tobacco Use   Smoking status: Former    Packs/day: 0.50    Years: 5.00    Total pack years: 2.50    Types: Cigarettes    Quit date: 03/15/1968    Years since quitting: 54.1   Smokeless tobacco: Never  Vaping Use   Vaping Use: Never used  Substance and Sexual Activity   Alcohol use: Not Currently    Alcohol/week:  1.0 standard drink of alcohol    Types: 1 Glasses of wine per week    Comment: occasional glass of wine   Drug use: No   Sexual activity: Not Currently  Other Topics Concern   Not on file  Social History Narrative   Right handed   Drinks caffeine   Condo two story with Media planner   Social Determinants of Health   Financial Resource Strain: Not on file  Food Insecurity: Not on file  Transportation Needs: Not on file  Physical Activity: Not on file  Stress: Not on file  Social Connections: Not on file   Past Surgical History:  Procedure Laterality Date   ABDOMINAL HYSTERECTOMY     Due to fibroids   BACK SURGERY  2011   lumbar Hyden     x2   EYE SURGERY Bilateral    Cataract surgery with IOL   FOOT SURGERY Right    "just scraped the bone"   LAPAROSCOPY N/A 04/08/2022   Procedure: LAPAROSCOPY DIAGNOSTIC WITH BIOPSIES;  Surgeon: Bernadene Bell, MD;  Location: [facility];  Service: Gynecology;  Laterality: N/A;   RETINAL DETACHMENT SURGERY Right 10/2011  TRANSFORAMINAL LUMBAR INTERBODY FUSION W/ MIS 1 LEVEL Right 04/26/2018   Procedure: Right Lumbar four-five Minimally invasive Transforaminal lumbar interbody fusion;  Surgeon: Judith Part, MD;  Location: [facility];  Service: Neurosurgery;  Laterality: Right;   Past Surgical History:  Procedure Laterality Date   ABDOMINAL HYSTERECTOMY     Due to fibroids   BACK SURGERY  2011   lumbar Fusion   BLADDER SURGERY  1985   CESAREAN SECTION     x2   EYE SURGERY Bilateral    Cataract surgery with IOL   FOOT SURGERY Right    "just scraped the bone"   LAPAROSCOPY N/A 04/08/2022   Procedure: LAPAROSCOPY DIAGNOSTIC WITH BIOPSIES;  Surgeon: Bernadene Bell, MD;  Location: [facility];  Service: Gynecology;  Laterality: N/A;   RETINAL DETACHMENT SURGERY Right 10/2011   TRANSFORAMINAL LUMBAR INTERBODY FUSION W/ MIS 1 LEVEL Right 04/26/2018   Procedure: Right Lumbar four-five Minimally invasive  Transforaminal lumbar interbody fusion;  Surgeon: Judith Part, MD;  Location: [facility];  Service: Neurosurgery;  Laterality: Right;   Past Medical History:  Diagnosis Date   Abnormality of gait 02/07/2015   Acquired hallux rigidus of left foot 12/17/2020   Acquired unequal leg length on left 08/08/2011   AMS (altered mental status) 12/28/2020   Biceps tendonitis on left 08/18/2018   BPPV (benign paroxysmal positional vertigo) 02/07/2015   Bunion 12/17/2020   Cerebrovascular disease    Cervical facet syndrome    Disorders of sacrum    Enthesopathy of hip region    Essential hypertension 02/07/2015   Generalized anxiety disorder    GERD (gastroesophageal reflux disease)    Headache    History of kidney stones    HLD (hyperlipidemia) 02/07/2015   Low back pain 02/07/2015   Lumbar facet arthropathy 07/05/2014   Lumbar post-laminectomy syndrome 06/11/2011   Lumbar radicular pain 07/29/2016   Lumbar radiculopathy 04/26/2018   Major depressive disorder    Mild cognitive impairment of uncertain or unknown etiology 01/24/2022   Multiple lacunar infarcts    MRI - bilateral basal ganglia and left thalamus   Myoclonus 09/19/2015   Rotator cuff tendonitis, left 06/08/2018   Sacroiliac joint dysfunction 06/11/2011   Sciatic neuritis    Stroke ([place])    hx TIA   Subcortical infarction    2016 MRI - small chronic subcortical infarct with associated chronic hemosiderin deposition within the right superior frontal gyrus.   Synovitis and tenosynovitis    Therapeutic opioid induced constipation 07/25/2015   Transient left leg weakness    Vision abnormalities    There were no vitals taken for this visit.  Opioid Risk Score:   Fall Risk Score:  `1  Depression screen PHQ 2/9     02/25/2022    1:02 PM 08/28/2021    2:31 PM 06/19/2021    2:53 PM 02/12/2021    2:41 PM 12/13/2020    1:55 PM 10/17/2020    1:34 PM 07/24/2020    2:48 PM  Depression screen PHQ 2/9  Decreased Interest 0 0 1  0 1 0 0  Down, Depressed, Hopeless 0 0 1 0 1 0 0  PHQ - 2 Score 0 0 2 0 2 0 0    Review of Systems  Gastrointestinal:  Positive for abdominal pain.  Musculoskeletal:  Positive for back pain and gait problem.       BUTTOCK PAIN  All other systems reviewed and are negative.      Objective:   Physical  Exam Vitals and nursing note reviewed.  Constitutional:      Appearance: Normal appearance.  Cardiovascular:     Rate and Rhythm: Normal rate and regular rhythm.     Pulses: Normal pulses.     Heart sounds: Normal heart sounds.  Pulmonary:     Effort: Pulmonary effort is normal.     Breath sounds: Normal breath sounds.  Musculoskeletal:     Cervical back: Normal range of motion and neck supple.     Comments: Normal Muscle Bulk and Muscle Testing Reveals:  Upper Extremities: Full ROM and Muscle Strength 5/5 Lumbar Paraspinal Tenderness: L-4-L-5 Lower Extremities: Full ROM and Muscle Strength 5/5 Arises from Table slowly using walker for support Narrow Based  Gait     Skin:    General: Skin is warm and dry.  Neurological:     Mental Status: She is alert and oriented to person, place, and time.  Psychiatric:        Mood and Affect: Mood normal.        Behavior: Behavior normal.         Assessment & Plan:  1. Facet Arthropathy/ Sacroiliac Joint Dysfunction: 04/24/2022. Refilled: Fentanyl 25 mcg patch  # 10- one patch every 3 days and   Percocet 10/325 mg one tablet 5 times a day as needed for pain #150 Second script  given for the following month.  Continue to Monitor. . We will continue the opioid monitoring program, this consists of regular clinic visits, examinations, urine drug screen, pill counts as well as use of [place] Controlled Substance Reporting system. A 12 month History has been reviewed on the [place] on 04/24/2022.  2. Lumbar Radicular  Pain: Continue HEP as Tolerated. Continue to Monitor. Continue Gabapentin  and Cymbalta. 04/24/2022 3. Gait Disorder: Continue HEP / Neurology Following. 02/ 29/2024 4. Myoclonus with sleep: Continue Klonopin. Continue current medication regimen. Continue to Monitor. 04/24/2022 5. Memory Changes:  Neurology:Following  Continue to Monitor. 04/24/2022 6. Right Shoulder Tendinitis: No complaints today.  Continue to Monitor. 04/24/2022   F/U in 2 months

## 2022-04-24 NOTE — Telephone Encounter (Signed)
Scheduled appt per 2/29 referral. Pt is aware of appt date and time. Pt is aware to arrive 15 mins prior to appt time and to bring and updated insurance card. Pt is aware of appt location.

## 2022-04-24 NOTE — Progress Notes (Signed)
Order placed for genetics referral per Dr. Ernestina Patches.

## 2022-04-25 ENCOUNTER — Telehealth: Payer: Self-pay | Admitting: Registered Nurse

## 2022-04-25 MED ORDER — OXYCODONE-ACETAMINOPHEN 10-325 MG PO TABS: 1.0000 | ORAL_TABLET | Freq: Every day | ORAL | 0 refills | Status: DC | PRN

## 2022-04-25 NOTE — Telephone Encounter (Signed)
Alexandria Mclaughlin oxycodone was increased to 5 times a day on 03/19/2022.  [facility] was called and [facility]je with pharnacist to remove previous prescription of her Oxycodone.  Oxycodone prescription was e- scribed today.

## 2022-04-28 ENCOUNTER — Inpatient Hospital Stay: Payer: Medicare HMO | Attending: Psychiatry | Admitting: Licensed Clinical Social Worker

## 2022-04-28 DIAGNOSIS — C541 Malignant neoplasm of endometrium: Secondary | ICD-10-CM | POA: Insufficient documentation

## 2022-04-28 DIAGNOSIS — Z5111 Encounter for antineoplastic chemotherapy: Secondary | ICD-10-CM | POA: Insufficient documentation

## 2022-04-28 LAB — DRUG TOX MONITOR 1 W/CONF, ORAL FLD
Amphetamines: NEGATIVE ng/mL (ref <10)
Barbiturates: NEGATIVE ng/mL (ref <10)
Benzodiazepines: NEGATIVE ng/mL (ref <0.50)
Buprenorphine: NEGATIVE ng/mL (ref <0.10)
Cocaine: NEGATIVE ng/mL (ref <5.0)
Codeine: NEGATIVE ng/mL (ref <2.5)
Dihydrocodeine: NEGATIVE ng/mL (ref <2.5)
Fentanyl: 0.48 ng/mL — ABNORMAL HIGH (ref <0.10)
Fentanyl: POSITIVE ng/mL — AB (ref <0.10)
Heroin Metabolite: NEGATIVE ng/mL (ref <1.0)
Hydrocodone: NEGATIVE ng/mL (ref <2.5)
Hydromorphone: NEGATIVE ng/mL (ref <2.5)
MARIJUANA: NEGATIVE ng/mL (ref <2.5)
MDMA: NEGATIVE ng/mL (ref <10)
Meprobamate: NEGATIVE ng/mL (ref <2.5)
Methadone: NEGATIVE ng/mL (ref <5.0)
Morphine: NEGATIVE ng/mL (ref <2.5)
Nicotine Metabolite: NEGATIVE ng/mL (ref <5.0)
Norhydrocodone: NEGATIVE ng/mL (ref <2.5)
Noroxycodone: 16.3 ng/mL — ABNORMAL HIGH (ref <2.5)
Opiates: POSITIVE ng/mL — AB (ref <2.5)
Oxycodone: 45.6 ng/mL — ABNORMAL HIGH (ref <2.5)
Oxymorphone: NEGATIVE ng/mL (ref <2.5)
Phencyclidine: NEGATIVE ng/mL (ref <10)
Tapentadol: NEGATIVE ng/mL (ref <5.0)
Tramadol: NEGATIVE ng/mL (ref <5.0)
Zolpidem: NEGATIVE ng/mL (ref <5.0)

## 2022-04-28 LAB — DRUG TOX ALC METAB W/CON, ORAL FLD: Alcohol Metabolite: NEGATIVE ng/mL (ref <25)

## 2022-04-28 NOTE — Progress Notes (Signed)
[place] Work  Clinical Social Work was referred by new patient protocol for assessment of psychosocial needs.  Clinical Social Worker contacted patient by phone  to offer support and assess for needs.    CSW and patient discussed common feeling and emotions when being diagnosed with cancer, and the importance of support during treatment.  CSW informed patient of the support team and support services at [facility].   Patient reports diagnosis of ovarian cancer being a shock and hard to process as she has no family history of cancer, so it was completely unexpected. She is interested in counseling- appointment scheduled for 05/08/22 in person.  CSW provided contact information and encouraged patient to call with any questions or concerns prior to appointment.     Wilson, [place] Worker [employer]

## 2022-04-29 ENCOUNTER — Other Ambulatory Visit: Payer: Self-pay | Admitting: Hematology and Oncology

## 2022-04-29 ENCOUNTER — Inpatient Hospital Stay: Payer: Medicare HMO

## 2022-04-29 ENCOUNTER — Encounter: Payer: Self-pay | Admitting: Hematology and Oncology

## 2022-04-29 ENCOUNTER — Other Ambulatory Visit: Payer: Medicare HMO

## 2022-04-29 DIAGNOSIS — C569 Malignant neoplasm of unspecified ovary: Secondary | ICD-10-CM

## 2022-04-29 DIAGNOSIS — C541 Malignant neoplasm of endometrium: Secondary | ICD-10-CM | POA: Diagnosis not present

## 2022-04-29 DIAGNOSIS — Z5111 Encounter for antineoplastic chemotherapy: Secondary | ICD-10-CM | POA: Diagnosis not present

## 2022-04-29 LAB — CMP (CANCER CENTER ONLY)
ALT: 8 U/L (ref 0–44)
AST: 14 U/L — ABNORMAL LOW (ref 15–41)
Albumin: 4 g/dL (ref 3.5–5.0)
Alkaline Phosphatase: 121 U/L (ref 38–126)
Anion gap: 7 (ref 5–15)
BUN: 14 mg/dL (ref 8–23)
CO2: 31 mmol/L (ref 22–32)
Calcium: 10.1 mg/dL (ref 8.9–10.3)
Chloride: 103 mmol/L (ref 98–111)
Creatinine: 0.57 mg/dL (ref 0.44–1.00)
GFR, Estimated: >60 mL/min (ref >60)
Glucose, Bld: 109 mg/dL — ABNORMAL HIGH (ref 70–99)
Potassium: 4.4 mmol/L (ref 3.5–5.1)
Sodium: 141 mmol/L (ref 135–145)
Total Bilirubin: 0.5 mg/dL (ref 0.3–1.2)
Total Protein: 7.3 g/dL (ref 6.5–8.1)

## 2022-04-29 LAB — CBC WITH DIFFERENTIAL (CANCER CENTER ONLY)
Abs Immature Granulocytes: 0.02 10*3/uL (ref 0.00–0.07)
Basophils Absolute: 0 10*3/uL (ref 0.0–0.1)
Basophils Relative: 1 %
Eosinophils Absolute: 0 10*3/uL (ref 0.0–0.5)
Eosinophils Relative: 0 %
HCT: 40.3 % (ref 36.0–46.0)
Hemoglobin: 12.6 g/dL (ref 12.0–15.0)
Immature Granulocytes: 0 %
Lymphocytes Relative: 20 %
Lymphs Abs: 1.5 10*3/uL (ref 0.7–4.0)
MCH: 28.2 pg (ref 26.0–34.0)
MCHC: 31.3 g/dL (ref 30.0–36.0)
MCV: 90.2 fL (ref 80.0–100.0)
Monocytes Absolute: 0.5 10*3/uL (ref 0.1–1.0)
Monocytes Relative: 7 %
Neutro Abs: 5.3 10*3/uL (ref 1.7–7.7)
Neutrophils Relative %: 72 %
Platelet Count: 338 10*3/uL (ref 150–400)
RBC: 4.47 MIL/uL (ref 3.87–5.11)
RDW: 14.4 % (ref 11.5–15.5)
WBC Count: 7.4 10*3/uL (ref 4.0–10.5)
nRBC: 0 % (ref 0.0–0.2)

## 2022-04-29 MED FILL — Fosaprepitant Dimeglumine For IV Infusion 150 MG (Base Eq): INTRAVENOUS | Qty: 5 | Status: AC

## 2022-04-29 MED FILL — Dexamethasone Sodium Phosphate Inj 100 MG/10ML: INTRAMUSCULAR | Qty: 1 | Status: AC

## 2022-04-29 NOTE — Progress Notes (Signed)
Called pt to introduce myself as her Arboriculturist and to discuss the [employer].  I went over what it covers but she declined it stating she thinks she makes too much money to qualify.  I requested the registration staff give her my card for any questions or concerns she may have in the future.

## 2022-04-30 ENCOUNTER — Inpatient Hospital Stay: Payer: Medicare HMO

## 2022-04-30 VITALS — BP 140/80 | HR 65 | Temp 98.6°F | Resp 16 | Wt 133.0 lb

## 2022-04-30 DIAGNOSIS — C569 Malignant neoplasm of unspecified ovary: Secondary | ICD-10-CM

## 2022-04-30 DIAGNOSIS — C541 Malignant neoplasm of endometrium: Secondary | ICD-10-CM | POA: Diagnosis not present

## 2022-04-30 DIAGNOSIS — Z5111 Encounter for antineoplastic chemotherapy: Secondary | ICD-10-CM | POA: Diagnosis not present

## 2022-04-30 MED ORDER — SODIUM CHLORIDE 0.9 % IV SOLN
150.0000 mg | Freq: Once | INTRAVENOUS | Status: AC
  Administered 2022-04-30: 150 mg via INTRAVENOUS
  Filled 2022-04-30: qty 150

## 2022-04-30 MED ORDER — PALONOSETRON HCL INJECTION 0.25 MG/5ML
0.2500 mg | Freq: Once | INTRAVENOUS | Status: AC
  Administered 2022-04-30: 0.25 mg via INTRAVENOUS
  Filled 2022-04-30: qty 5

## 2022-04-30 MED ORDER — FAMOTIDINE IN NACL 20-0.9 MG/50ML-% IV SOLN
20.0000 mg | Freq: Once | INTRAVENOUS | Status: AC
  Administered 2022-04-30: 20 mg via INTRAVENOUS
  Filled 2022-04-30: qty 50

## 2022-04-30 MED ORDER — SODIUM CHLORIDE 0.9 % IV SOLN
403.2000 mg | Freq: Once | INTRAVENOUS | Status: AC
  Administered 2022-04-30: 400 mg via INTRAVENOUS
  Filled 2022-04-30: qty 40

## 2022-04-30 MED ORDER — SODIUM CHLORIDE 0.9 % IV SOLN: Freq: Once | INTRAVENOUS | Status: AC

## 2022-04-30 MED ORDER — CETIRIZINE HCL 10 MG/ML IV SOLN
10.0000 mg | Freq: Once | INTRAVENOUS | Status: AC
  Administered 2022-04-30: 10 mg via INTRAVENOUS
  Filled 2022-04-30: qty 1

## 2022-04-30 MED ORDER — SODIUM CHLORIDE 0.9 % IV SOLN
87.5000 mg/m2 | Freq: Once | INTRAVENOUS | Status: AC
  Administered 2022-04-30: 144 mg via INTRAVENOUS
  Filled 2022-04-30: qty 24

## 2022-04-30 MED ORDER — SODIUM CHLORIDE 0.9 % IV SOLN
10.0000 mg | Freq: Once | INTRAVENOUS | Status: AC
  Administered 2022-04-30: 10 mg via INTRAVENOUS
  Filled 2022-04-30: qty 10

## 2022-04-30 NOTE — Patient Instructions (Signed)
[facility]  Discharge Instructions: Thank you for choosing [facility] to provide your oncology and hematology care.   If you have a lab appointment with the [facility], please go directly to the [facility] and check in at the registration area.   Wear comfortable clothing and clothing appropriate for easy access to any Portacath or PICC line.   We strive to give you quality time with your provider. You may need to reschedule your appointment if you arrive late (15 or more minutes).  Arriving late affects you and other patients whose appointments are after yours.  Also, if you miss three or more appointments without notifying the office, you may be dismissed from the clinic at the provider's discretion.      For prescription refill requests, have your pharmacy contact our office and allow 72 hours for refills to be completed.    Today you received the following chemotherapy and/or immunotherapy agents: paclitaxel and carboplatin      To help prevent nausea and vomiting after your treatment, we encourage you to take your nausea medication as directed.  BELOW ARE SYMPTOMS THAT SHOULD BE REPORTED IMMEDIATELY: *FEVER GREATER THAN 100.4 F (38 C) OR HIGHER *CHILLS OR SWEATING *NAUSEA AND VOMITING THAT IS NOT CONTROLLED WITH YOUR NAUSEA MEDICATION *UNUSUAL SHORTNESS OF BREATH *UNUSUAL BRUISING OR BLEEDING *URINARY PROBLEMS (pain or burning when urinating, or frequent urination) *BOWEL PROBLEMS (unusual diarrhea, constipation, pain near the anus) TENDERNESS IN MOUTH AND THROAT WITH OR WITHOUT PRESENCE OF ULCERS (sore throat, sores in mouth, or a toothache) UNUSUAL RASH, SWELLING OR PAIN  UNUSUAL VAGINAL DISCHARGE OR ITCHING   Items with * indicate a potential emergency and should be followed up as soon as possible or go to the Emergency Department if any problems should occur.  Please show the CHEMOTHERAPY ALERT CARD or IMMUNOTHERAPY  ALERT CARD at check-in to the Emergency Department and triage nurse.  Should you have questions after your visit or need to cancel or reschedule your appointment, please contact [facility]  Dept: [phone]  and follow the prompts.  Office hours are 8:00 a.m. to 4:30 p.m. Monday - Friday. Please note that voicemails left after 4:00 p.m. may not be returned until the following business day.  We are closed weekends and major holidays. You have access to a nurse at all times for urgent questions. Please call the main number to the clinic Dept: [phone] and follow the prompts.   For any non-urgent questions, you may also contact your provider using MyChart. We now offer e-Visits for anyone 80 and older to request care online for non-urgent symptoms. For details visit [email or web address].   Also download the MyChart app! Go to the app store, search "MyChart", open the app, select [facility], and log in with your MyChart username and password.

## 2022-05-02 ENCOUNTER — Telehealth: Payer: Self-pay

## 2022-05-02 LAB — CA 125: Cancer Antigen (CA) 125: 193 U/mL — ABNORMAL HIGH (ref 0.0–38.1)

## 2022-05-02 NOTE — Telephone Encounter (Signed)
-----   Message from Clyda Hurdle, RN sent at 04/30/2022  2:06 PM EST ----- Regarding: 1st Time Taxol/Carbo Dr Calton Dach patient 1st time Taxol/Carbo Did very well with treatment!

## 2022-05-02 NOTE — Telephone Encounter (Signed)
Alexandria Mclaughlin states that she is doing fine. She is eating, drinking, and urinating well.  She knows to call the office at [phone] if she has any questions or concerns.

## 2022-05-02 NOTE — Telephone Encounter (Signed)
Called to see how she is doing today. Denies constipation. She is taking laxatives and last bm today. She will call the office for questions or concerns.

## 2022-05-02 NOTE — Telephone Encounter (Signed)
-----   Message from Heath Lark, MD sent at 05/02/2022  6:57 AM EST ----- Can you call her today and assess whether she is doing ok with constipation Her baseline is once a week

## 2022-05-03 NOTE — Progress Notes (Signed)
Cardiology Office Note:    Date:  05/05/2022   ID:  Alexandria Mclaughlin, DOB 03-Jun-1940, MRN [id number]  PCP:  Alexandria Seashore, MD  Cardiologist:  None  Electrophysiologist:  None   Referring MD: Alexandria Seashore, MD   Chief Complaint/Reason for Referral: ASCVD  History of Present Illness:    Alexandria Mclaughlin is a 82 y.o. female with a history of HTN, HLD, osteoporosis, hyperthyroidism, chronic pain, ckd stage 2, depression, abd aorta atherosclerosis, TIA, and recent diagnosis of ovarian cancer who presents for evaluation of ASCVD per request of Alexandria Seashore, MD.  She is followed by Dr. Alvy Mclaughlin and is ***currently undergoing chemotherapy with carbopatin and Taxol, IV every 3 weeks for 6 total cycles.   On recent cancer staging CT of the chest, noted to have 3 vessel CAC involving LM and proximal coronaries, as well as at least moderate root atherosclerosis and aortic valve calcifications. Takes crestor 20 mg daily. Lipids from 12/29/2020 well controlled, LDL 69, Trig 93, HDL 56.  ***  Echocardiogram performed 12/29/2020 - normal biventricular size and function, grade I ddx, AV calcification and sclerosis.  EKG 04/01/22 - grossly normal, minimal LVH criteria.  The patient denies chest pain, chest pressure, dyspnea at rest or with exertion, palpitations, PND, orthopnea, or leg swelling. Denies cough, fever, chills. Denies syncope or presyncope. Denies dizziness or lightheadedness. Denies snoring.     Past Medical History:  Diagnosis Date   Abnormality of gait 02/07/2015   Acquired hallux rigidus of left foot 12/17/2020   Acquired unequal leg length on left 08/08/2011   AMS (altered mental status) 12/28/2020   Biceps tendonitis on left 08/18/2018   BPPV (benign paroxysmal positional vertigo) 02/07/2015   Bunion 12/17/2020   Cerebrovascular disease    Cervical facet syndrome    Disorders of sacrum    Enthesopathy of hip region    Essential hypertension 02/07/2015    Generalized anxiety disorder    GERD (gastroesophageal reflux disease)    Headache    History of kidney stones    HLD (hyperlipidemia) 02/07/2015   Low back pain 02/07/2015   Lumbar facet arthropathy 07/05/2014   Lumbar post-laminectomy syndrome 06/11/2011   Lumbar radicular pain 07/29/2016   Lumbar radiculopathy 04/26/2018   Major depressive disorder    Mild cognitive impairment of uncertain or unknown etiology 01/24/2022   Multiple lacunar infarcts    MRI - bilateral basal ganglia and left thalamus   Myoclonus 09/19/2015   Rotator cuff tendonitis, left 06/08/2018   Sacroiliac joint dysfunction 06/11/2011   Sciatic neuritis    Stroke ([place])    hx TIA   Subcortical infarction    2016 MRI - small chronic subcortical infarct with associated chronic hemosiderin deposition within the right superior frontal gyrus.   Synovitis and tenosynovitis    Therapeutic opioid induced constipation 07/25/2015   Transient left leg weakness    Vision abnormalities     Past Surgical History:  Procedure Laterality Date   ABDOMINAL HYSTERECTOMY     Due to fibroids   BACK SURGERY  2011   lumbar Fusion   BLADDER SURGERY  1985   CESAREAN SECTION     x2   EYE SURGERY Bilateral    Cataract surgery with IOL   FOOT SURGERY Right    "just scraped the bone"   LAPAROSCOPY N/A 04/08/2022   Procedure: LAPAROSCOPY DIAGNOSTIC WITH BIOPSIES;  Surgeon: Alexandria Bell, MD;  Location: [facility];  Service: Gynecology;  Laterality: N/A;   RETINAL DETACHMENT SURGERY Right  10/2011   TRANSFORAMINAL LUMBAR INTERBODY FUSION W/ MIS 1 LEVEL Right 04/26/2018   Procedure: Right Lumbar four-five Minimally invasive Transforaminal lumbar interbody fusion;  Surgeon: Alexandria Part, MD;  Location: [facility];  Service: Neurosurgery;  Laterality: Right;    Current Medications: No outpatient medications have been marked as taking for the 05/05/22 encounter (Appointment) with Alexandria Munroe, MD.     Allergies:   Patient has  no known allergies.   Social History   Tobacco Use   Smoking status: Former    Packs/day: 0.50    Years: 5.00    Total pack years: 2.50    Types: Cigarettes    Quit date: 03/15/1968    Years since quitting: 54.1   Smokeless tobacco: Never  Vaping Use   Vaping Use: Never used  Substance Use Topics   Alcohol use: Not Currently    Alcohol/week: 1.0 standard drink of alcohol    Types: 1 Glasses of wine per week    Comment: occasional glass of wine   Drug use: No     Family History: The patient's family history includes Diabetes in her mother; Heart disease in her brother, father, and mother; Stroke in her mother. There is no history of Colon cancer, Breast cancer, Ovarian cancer, Endometrial cancer, Pancreatic cancer, or Prostate cancer.  ROS:   Please see the history of present illness.    All other systems reviewed and are negative.  EKGs/Labs/Other Studies Reviewed:    The following studies were reviewed today:  EKG:  ***  Imaging studies that I have independently reviewed today: CT chest 04/18/22, cor cals 3 vessel, aortic athero, root calcifications, AV calcifications  Recent Labs: 04/29/2022: ALT 8; BUN 14; Creatinine 0.57; Hemoglobin 12.6; Platelet Count 338; Potassium 4.4; Sodium 141  Recent Lipid Panel    Component Value Date/Time   CHOL 144 12/29/2020 0319   TRIG 93 12/29/2020 0319   HDL 56 12/29/2020 0319   CHOLHDL 2.6 12/29/2020 0319   VLDL 19 12/29/2020 0319   LDLCALC 69 12/29/2020 0319    Physical Exam:    VS:  There were no vitals taken for this visit.    Wt Readings from Last 5 Encounters:  04/30/22 133 lb (60.3 kg)  04/24/22 136 lb (61.7 kg)  04/22/22 139 lb 8 oz (63.3 kg)  04/17/22 138 lb 6.4 oz (62.8 kg)  04/14/22 136 lb 14.4 oz (62.1 kg)    Constitutional: No acute distress Eyes: sclera non-icteric, normal conjunctiva and lids ENMT: normal dentition, moist mucous membranes Cardiovascular: regular rhythm, normal rate, no murmur. S1 and S2  normal. No jugular venous distention.  Respiratory: clear to auscultation bilaterally GI : normal bowel sounds, soft and nontender. No distention.   MSK: extremities warm, well perfused. No edema.  NEURO: grossly nonfocal exam, moves all extremities. PSYCH: alert and oriented x 3, normal mood and affect.   ASSESSMENT:    No diagnosis found. PLAN:    No diagnosis found.  Total time of encounter: 60 minutes total time of encounter, including 30 minutes spent in face-to-face patient care on the date of this encounter. This time includes coordination of care and counseling regarding above mentioned problem list. Remainder of non-face-to-face time involved reviewing chart documents/testing relevant to the patient encounter and documentation in the medical record. I have independently reviewed documentation from referring provider. 79 pages of outside records reviewed, with > 20 minute record review.  Cherlynn Kaiser, MD, [place]   Shared Decision  Making/Informed Consent:   {Are you ordering a CV Procedure (e.g. stress test, cath, DCCV, TEE, etc)?   Press F2        :[id number]   Medication Adjustments/Labs and Tests Ordered: Current medicines are reviewed at length with the patient today.  Concerns regarding medicines are outlined above.   No orders of the defined types were placed in this encounter.   No orders of the defined types were placed in this encounter.   There are no Patient Instructions on file for this visit.

## 2022-05-05 ENCOUNTER — Encounter: Payer: Self-pay | Admitting: Internal Medicine

## 2022-05-05 ENCOUNTER — Ambulatory Visit: Payer: Medicare HMO | Attending: Internal Medicine | Admitting: Internal Medicine

## 2022-05-05 VITALS — BP 137/88 | HR 88 | Ht 63.0 in | Wt 134.2 lb

## 2022-05-05 DIAGNOSIS — I10 Essential (primary) hypertension: Secondary | ICD-10-CM

## 2022-05-05 DIAGNOSIS — R0609 Other forms of dyspnea: Secondary | ICD-10-CM | POA: Diagnosis not present

## 2022-05-05 DIAGNOSIS — I2584 Coronary atherosclerosis due to calcified coronary lesion: Secondary | ICD-10-CM

## 2022-05-05 DIAGNOSIS — I2510 Atherosclerotic heart disease of native coronary artery without angina pectoris: Secondary | ICD-10-CM

## 2022-05-05 DIAGNOSIS — C569 Malignant neoplasm of unspecified ovary: Secondary | ICD-10-CM

## 2022-05-05 MED ORDER — ROSUVASTATIN CALCIUM 40 MG PO TABS: 40.0000 mg | ORAL_TABLET | Freq: Every day | ORAL | 3 refills | Status: DC

## 2022-05-05 NOTE — Patient Instructions (Addendum)
Medication Instructions:  INCREASE ROSUVASTATIN TO '40MG'$  DAILY- OKAY TO DOUBLE UP ON '20mg'$  TABLETS UNTIL YOU PICK UP NEW PRESCRIPTION  FOR '40MG'$  *If you need a refill on your cardiac medications before your next appointment, please call your pharmacy*  Lab Work: None Ordered At This Time.  If you have labs (blood work) drawn today and your tests are completely normal, you will receive your results only by: [place] (if you have MyChart) OR A paper copy in the mail If you have any lab test that is abnormal or we need to change your treatment, we will call you to review the results.  Testing/Procedures: Your physician has requested that you have an echocardiogram. Echocardiography is a painless test that uses sound waves to create images of your heart. It provides your doctor with information about the size and shape of your heart and how well your heart's chambers and valves are working. You may receive an ultrasound enhancing agent through an IV if needed to better visualize your heart during the echo.This procedure takes approximately one hour. There are no restrictions for this procedure. This will take place at the [address], [address].   How to Prepare for Your Cardiac PET/CT Stress Test:  1. Please do not take these medications before your test:   Medications that may interfere with the cardiac pharmacological stress agent (ex. nitrates - including erectile dysfunction medications, isosorbide mononitrate, tamulosin or beta-blockers) the day of the exam. (Erectile dysfunction medication should be held for at least 72 hrs prior to test) Theophylline containing medications for 12 hours. Dipyridamole 48 hours prior to the test. Your remaining medications may be taken with water.  2. Nothing to eat or drink, except water, 3 hours prior to arrival time.   NO caffeine/decaffeinated products, or chocolate 12 hours prior to arrival.  3. NO perfume, cologne or lotion  4. Total time  is 1 to 2 hours; you may want to bring reading material for the waiting time.  5. Please report to Radiology at the [facility] Main Entrance 30 minutes early for your test.  [place], [place] [id number]  Diabetic Preparation:  Hold oral medications. You may take NPH and Lantus insulin. Do not take Humalog or Humulin R (Regular Insulin) the day of your test. Check blood sugars prior to leaving the house. If able to eat breakfast prior to 3 hour fasting, you may take all medications, including your insulin, Do not worry if you miss your breakfast dose of insulin - start at your next meal.  IF YOU THINK YOU MAY BE PREGNANT, OR ARE NURSING PLEASE INFORM THE TECHNOLOGIST.  In preparation for your appointment, medication and supplies will be purchased.  Appointment availability is limited, so if you need to cancel or reschedule, please call the Radiology Department at [phone]  24 hours in advance to avoid a cancellation fee of $100.00  What to Expect After you Arrive:  Once you arrive and check in for your appointment, you will be taken to a preparation room within the Radiology Department.  A technologist or Nurse will obtain your medical history, verify that you are correctly prepped for the exam, and explain the procedure.  Afterwards,  an IV will be started in your arm and electrodes will be placed on your skin for EKG monitoring during the stress portion of the exam. Then you will be escorted to the PET/CT scanner.  There, staff will get you positioned on the  scanner and obtain a blood pressure and EKG.  During the exam, you will continue to be connected to the EKG and blood pressure machines.  A small, safe amount of a radioactive tracer will be injected in your IV to obtain a series of pictures of your heart along with an injection of a stress agent.    After your Exam:  It is recommended that you eat a meal and drink a caffeinated beverage to counter act any  effects of the stress agent.  Drink plenty of fluids for the remainder of the day and urinate frequently for the first couple of hours after the exam.  Your doctor will inform you of your test results within 7-10 business days.  For questions about your test or how to prepare for your test, please call: Marchia Bond, Cardiac Imaging Nurse Navigator  Gordy Clement, Cardiac Imaging Nurse Navigator Office: [phone]  Follow-Up: At [facility], you and your health needs are our priority.  As part of our continuing mission to provide you with exceptional heart care, we have created designated Provider Care Teams.  These Care Teams include your primary Cardiologist (physician) and Advanced Practice Providers (APPs -  Physician Assistants and Nurse Practitioners) who all work together to provide you with the care you need, when you need it.  Your next appointment:   8-12 WEEKS  Provider:   [name], MD

## 2022-05-07 ENCOUNTER — Telehealth: Payer: Self-pay | Admitting: *Deleted

## 2022-05-07 NOTE — Telephone Encounter (Signed)
Oral swab drug screen was consistent for prescribed medications.  ?

## 2022-05-08 ENCOUNTER — Inpatient Hospital Stay: Payer: Medicare HMO | Admitting: Licensed Clinical Social Worker

## 2022-05-08 ENCOUNTER — Other Ambulatory Visit: Payer: Self-pay | Admitting: Registered Nurse

## 2022-05-08 ENCOUNTER — Telehealth: Payer: Self-pay | Admitting: Registered Nurse

## 2022-05-08 NOTE — Telephone Encounter (Signed)
Placed a call to Ms. Ailey, [facility] pharmacy was called and medication list was reviewed.  She is currently on Cymbalta 60 mg . Call placed to Ms. Stitt, no answer, left message to return the call.

## 2022-05-08 NOTE — Progress Notes (Signed)
[place] CSW Counseling Note  Patient was referred by new patient protocol. Treatment type: Individual  Presenting Concerns: Patient and/or family reports the following symptoms/concerns: depression Duration of problem: since diagnosis (Jan 2024); Severity of problem: moderate   Orientation:oriented to person, place, time/date, and situation.   Affect: Appropriate and Congruent Risk of harm to self or others: No plan to harm self or others  Patient and/or Family's Strengths/Protective Factors: Ability for insight  Communication skills , supportive son     Goals Addressed: Patient will:  Reduce symptoms of: depression Increase knowledge and/or ability of: coping skills  Increase healthy adjustment to current life circumstances   Progress towards Goals: Initial   Interventions: Interventions utilized:  CBT and Strength-based      Assessment: Patient currently experiencing increased depression related to ovarian cancer diagnosis. Pt reports being shocked as she expected heart disease at some point, but not cancer.  She feels she is obsessing over thoughts of death and the afterlife and trying to put her trust in God's hands.  CSW normalized feelings and used CBT to discuss strategies to manage obsessive thoughts and address depression.  Pt was able to identify other shock and losses in her life and how she coped with those.  She was also able to identify positives in her life currently (supportive son, 16 year old great grandchild, playing cards 2x/week, seeing her boyfriend, remembering her travels, watching movies).    Pt is interested in continuing to try to find a new faith community that matches her beliefs and in being connected with chaplain for spiritual support.      Plan: Follow up with CSW: 2 weeks Behavioral recommendations: give yourself time daily to think about what dying and afterlife mean for you. Then, when thoughts pop in during other times, focus on the other  pieces of your life that bring you joy.  Get up every day and change your scenery, see family, call a friend, etc.   Referral(s): Chaplain       Levonia Wolfley E Ahmet Schank, LCSW

## 2022-05-09 ENCOUNTER — Encounter: Payer: Self-pay | Admitting: Hematology and Oncology

## 2022-05-14 ENCOUNTER — Encounter: Payer: Self-pay | Admitting: Hematology and Oncology

## 2022-05-15 ENCOUNTER — Encounter: Payer: Self-pay | Admitting: General Practice

## 2022-05-15 NOTE — Progress Notes (Signed)
[place] Spiritual Care Note  Referred by Social Work for spiritual support per Ms Onorato's request. Reached Ms Pearlman by phone to introduce Spiritual Care as part of her support team.  We talked about some of per present spiritual needs related to her diagnosis and treatment, as well as personal past experience and family concerns, praying together for divine healing and guiding, comforting presence.  We plan to follow up in the infusion room so we can meet in person, and Ms Kluk has chaplain's direct number in case needs arise in the meantime.   [place], [place], [facility] Pager [phone] Voicemail [phone]

## 2022-05-21 ENCOUNTER — Other Ambulatory Visit: Payer: Self-pay | Admitting: Registered Nurse

## 2022-05-21 DIAGNOSIS — G253 Myoclonus: Secondary | ICD-10-CM

## 2022-05-22 ENCOUNTER — Inpatient Hospital Stay: Payer: Medicare HMO | Admitting: Licensed Clinical Social Worker

## 2022-05-22 ENCOUNTER — Other Ambulatory Visit: Payer: Self-pay

## 2022-05-22 NOTE — Progress Notes (Signed)
[place] CSW Counseling Note  Patient was referred by new patient protocol. Treatment type: Individual  Presenting Concerns: Patient and/or family reports the following symptoms/concerns: depression Duration of problem: since diagnosis (Jan 2024); Severity of problem: moderate   Orientation:oriented to person, place, time/date, and situation.   Affect: Appropriate and Congruent Risk of harm to self or others: No plan to harm self or others  Patient and/or Family's Strengths/Protective Factors: Ability for insight  Communication skills , supportive son     Goals Addressed: Patient will:  Reduce symptoms of: depression Increase knowledge and/or ability of: coping skills  Increase healthy adjustment to current life circumstances   Progress towards Goals: Progressing   Interventions: Interventions utilized:  CBT and Strength-based      Assessment: Patient currently experiencing decreased depression since last visit. She has been feeling well physically to a point of not thinking about cancer daily.  Discussed changing view of this to gratitude rather than feeling in denial.  Discussed finding ways to take back choice (ex: wig vs hat).    Spent portion of session discussing quality of life and pt's fear of debilitating pain because of her past experience.  Pt does not want to end up in pain that keeps her in bed and prevents from engaging with her friends and family. Pt currently feeling better physically than she did before her first treatment.      Plan: Follow up with CSW: 3 weeks (seeing chaplain at infusion next week) Behavioral recommendations: continue enjoyed daily activities. When having days and times of not thinking about cancer, instead of believing you are in denial, change to appreciation of having pain free moments. Referral(s): [place] Ingris Pasquarella, LCSW

## 2022-05-23 ENCOUNTER — Other Ambulatory Visit: Payer: Self-pay | Admitting: Student

## 2022-05-23 DIAGNOSIS — C569 Malignant neoplasm of unspecified ovary: Secondary | ICD-10-CM

## 2022-05-24 NOTE — Consult Note (Signed)
Chief Complaint: Patient was seen in consultation today for port a cath placement  Referring Physician(s): Gorsuch,Ni  Supervising Physician: Arne Cleveland  Patient Status: [facility] - Out-pt  History of Present Illness: Alexandria Mclaughlin is an 82 y.o. female with PMH sig for HTN, HLD, osteoporosis, hyperthyroidism, chronic pain, CKD  stage 2, CAD, anxiety/depression, GERD, nephrolithiasis, abd aorta atherosclerosis, TIA, and recent diagnosis of ovarian cancer. She presents today for port a cath placement to assist with treatment.   Past Medical History:  Diagnosis Date   Abnormality of gait 02/07/2015   Acquired hallux rigidus of left foot 12/17/2020   Acquired unequal leg length on left 08/08/2011   AMS (altered mental status) 12/28/2020   Biceps tendonitis on left 08/18/2018   BPPV (benign paroxysmal positional vertigo) 02/07/2015   Bunion 12/17/2020   Cancer [place] 04/25/2022   Cerebrovascular disease    Cervical facet syndrome    Coronary artery disease 04/27/2022   Disorders of sacrum    Enthesopathy of hip region    Essential hypertension 02/07/2015   Generalized anxiety disorder    GERD (gastroesophageal reflux disease)    Headache    History of kidney stones    HLD (hyperlipidemia) 02/07/2015   Low back pain 02/07/2015   Lumbar facet arthropathy 07/05/2014   Lumbar post-laminectomy syndrome 06/11/2011   Lumbar radicular pain 07/29/2016   Lumbar radiculopathy 04/26/2018   Major depressive disorder    Mild cognitive impairment of uncertain or unknown etiology 01/24/2022   Multiple lacunar infarcts    MRI - bilateral basal ganglia and left thalamus   Myoclonus 09/19/2015   Rotator cuff tendonitis, left 06/08/2018   Sacroiliac joint dysfunction 06/11/2011   Sciatic neuritis    Stroke ([place])    hx TIA   Subcortical infarction    2016 MRI - small chronic subcortical infarct with associated chronic hemosiderin deposition within the right superior frontal gyrus.    Synovitis and tenosynovitis    Therapeutic opioid induced constipation 07/25/2015   Thyroid disease    Transient left leg weakness    Vision abnormalities     Past Surgical History:  Procedure Laterality Date   ABDOMINAL HYSTERECTOMY     Due to fibroids   BACK SURGERY  2011   lumbar Fusion   BLADDER SURGERY  1985   CESAREAN SECTION     x2   EYE SURGERY Bilateral    Cataract surgery with IOL   FOOT SURGERY Right    "just scraped the bone"   LAPAROSCOPY N/A 04/08/2022   Procedure: LAPAROSCOPY DIAGNOSTIC WITH BIOPSIES;  Surgeon: Bernadene Bell, MD;  Location: [facility];  Service: Gynecology;  Laterality: N/A;   RETINAL DETACHMENT SURGERY Right 10/2011   TRANSFORAMINAL LUMBAR INTERBODY FUSION W/ MIS 1 LEVEL Right 04/26/2018   Procedure: Right Lumbar four-five Minimally invasive Transforaminal lumbar interbody fusion;  Surgeon: Judith Part, MD;  Location: [facility];  Service: Neurosurgery;  Laterality: Right;    Allergies: Patient has no known allergies.  Medications: Prior to Admission medications   Medication Sig Start Date End Date Taking? Authorizing Provider  aspirin-acetaminophen-caffeine (EXCEDRIN MIGRAINE) [phone] MG tablet Take 1 tablet by mouth every 6 (six) hours as needed for headache or migraine.    [provider]  Cholecalciferol (VITAMIN D) 50 MCG (2000 UT) CAPS Take 2,000 Units by mouth daily.    [provider]  clonazePAM (KLONOPIN) 0.5 MG tablet TAKE 1 TABLET BY MOUTH AT BEDTIME AS NEEDED ANXIETY 05/22/22   Raulkar, Clide Deutscher, MD  cyanocobalamin 1000 MCG tablet 1,000 mcg daily.    [provider]  dexamethasone (DECADRON) 4 MG tablet Take 2 tabs at the night before and 2 tab the morning of chemotherapy, every 3 weeks, by mouth x 6 cycles 04/22/22   Heath Lark, MD  DULoxetine (CYMBALTA) 60 MG capsule Take 60 mg by mouth daily. 02/20/22   [provider]  fentaNYL (DURAGESIC) 25 MCG/HR Place 1 patch onto the skin every 3  (three) days. 04/24/22 04/24/23  Bayard Hugger, NP  gabapentin (NEURONTIN) 600 MG tablet TAKE 1 TABLET BY MOUTH FOUR TIMES A DAY 02/13/22   Bayard Hugger, NP  irbesartan (AVAPRO) 150 MG tablet Take 150 mg by mouth daily.    [provider]  lactulose (CHRONULAC) 10 GM/15ML solution Take 15 mLs (10 g total) by mouth 3 (three) times daily. 04/22/22   Heath Lark, MD  lidocaine-prilocaine (EMLA) cream Apply to affected area once 04/22/22   Heath Lark, MD  magnesium oxide (MAG-OX) 400 (240 Mg) MG tablet Take 1 tablet (400 mg total) by mouth daily. 04/22/22   Heath Lark, MD  Melatonin 10 MG TABS Take 10 mg by mouth at bedtime as needed (sleep).    [provider]  methimazole (TAPAZOLE) 5 MG tablet Take 5 mg by mouth 2 (two) times a week.    [provider]  ondansetron (ZOFRAN) 8 MG tablet Take 1 tablet (8 mg total) by mouth every 8 (eight) hours as needed for nausea or vomiting. Start on the third day after carboplatin. 04/22/22   Heath Lark, MD  oxyCODONE-acetaminophen (PERCOCET) 10-325 MG tablet Take 1 tablet by mouth 5 (five) times daily as needed for pain. 04/25/22 04/25/23  Bayard Hugger, NP  polyethylene glycol (MIRALAX / GLYCOLAX) 17 g packet Take 17 g by mouth 2 (two) times daily.    [provider]  prochlorperazine (COMPAZINE) 10 MG tablet Take 1 tablet (10 mg total) by mouth every 6 (six) hours as needed for nausea or vomiting. 04/22/22   Heath Lark, MD  rosuvastatin (CRESTOR) 40 MG tablet Take 1 tablet (40 mg total) by mouth at bedtime. 05/05/22   [name], MD  senna (SENOKOT) 8.6 MG tablet Take 2 tablets by mouth 2 (two) times daily.    [provider]  traZODone (DESYREL) 100 MG tablet TAKE 1 TABLET BY MOUTH EVERYDAY AT BEDTIME 12/09/21   Bayard Hugger, NP     Family History  Problem Relation Age of Onset   Diabetes Mother    Heart disease Mother    Stroke Mother    Heart disease Father    Heart disease Brother    Colon  cancer Neg Hx    Breast cancer Neg Hx    Ovarian cancer Neg Hx    Endometrial cancer Neg Hx    Pancreatic cancer Neg Hx    Prostate cancer Neg Hx     Social History   Socioeconomic History   Marital status: Widowed    Spouse name: Not on file   Number of children: Not on file   Years of education: 14   Highest education level: Associate degree: academic program  Occupational History   Occupation: Retired  Tobacco Use   Smoking status: Former    Packs/day: 0.25    Years: 15.00    Additional pack years: 0.00    Total pack years: 3.75    Types: Cigarettes    Quit date: 03/15/1968    Years since quitting:  54.2   Smokeless tobacco: Never  Vaping Use   Vaping Use: Never used  Substance and Sexual Activity   Alcohol use: Not Currently    Comment: occasional glass of wine   Drug use: Yes    Frequency: 20.0 times per week    Types: Fentanyl, Hydrocodone   Sexual activity: Not Currently  Other Topics Concern   Not on file  Social History Narrative   Right handed   Drinks caffeine   Condo two story with Media pl[employer]   Social Determinants of Health   Financial Resource Strain: Not on file  Food Insecurity: Not on file  Transportation Needs: Not on file  Physical Activity: Not on file  Stress: Not on file  Social Connections: Not on file      Review of Systems  Vital Signs:   Code Status:    Physical Exam  Imaging: No results found.  Labs:  CBC: Recent Labs    03/17/22 1258 03/17/22 1325 04/01/22 1437 04/29/22 1303  WBC 7.9  --  7.9 7.4  HGB 13.4 15.3* 12.7 12.6  HCT 45.2 45.0 41.9 40.3  PLT 237  --  323 338    COAGS: No results for input(s): "INR", "APTT" in the last 8760 hours.  BMP: Recent Labs    03/17/22 1258 03/17/22 1325 04/01/22 1437 04/29/22 1303  NA 141 142 140 141  K 4.4 4.3 4.6 4.4  CL 103 102 103 103  CO2 28  --  30 31  GLUCOSE 113* 107* 101* 109*  BUN 17 16 16 14   CALCIUM 9.7  --  9.4 10.1  CREATININE 0.51 0.40* 0.55  0.57  GFRNONAA >60  --  >60 >60    LIVER FUNCTION TESTS: Recent Labs    03/17/22 1258 04/01/22 1437 04/29/22 1303  BILITOT 0.8 0.6 0.5  AST 20 18 14*  ALT 11 9 8   ALKPHOS 119 89 121  PROT 8.1 7.2 7.3  ALBUMIN 4.2 3.6 4.0    TUMOR MARKERS: Recent Labs    03/18/22 1559  CEA 2.40    Assessment and Plan:    Thank you for this interesting consult.  I greatly enjoyed meeting AKERIA EDLEY and look forward to participating in their care.  A copy of this report was sent to the requesting provider on this date.  Electronically Signed: D. Rowe Robert, PA-C 05/24/2022, 4:34 PM   I spent a total of  25 minutes   in face to face in clinical consultation, greater than 50% of which was counseling/coordinating care for port a cath placement

## 2022-05-26 ENCOUNTER — Ambulatory Visit (HOSPITAL_COMMUNITY): Payer: Medicare HMO

## 2022-05-26 ENCOUNTER — Ambulatory Visit (HOSPITAL_COMMUNITY)
Admission: RE | Admit: 2022-05-26 | Discharge: 2022-05-26 | Disposition: A | Discharge status: Home / Self Care | Payer: Medicare HMO | Source: Ambulatory Visit | Attending: Hematology and Oncology | Admitting: Hematology and Oncology

## 2022-05-26 ENCOUNTER — Other Ambulatory Visit: Payer: Self-pay

## 2022-05-26 ENCOUNTER — Encounter (HOSPITAL_COMMUNITY): Payer: Self-pay

## 2022-05-26 DIAGNOSIS — Z87891 Personal history of nicotine dependence: Secondary | ICD-10-CM | POA: Diagnosis not present

## 2022-05-26 DIAGNOSIS — C569 Malignant neoplasm of unspecified ovary: Secondary | ICD-10-CM

## 2022-05-26 DIAGNOSIS — R69 Illness, unspecified: Secondary | ICD-10-CM | POA: Diagnosis not present

## 2022-05-26 DIAGNOSIS — Z8673 Personal history of transient ischemic attack (TIA), and cerebral infarction without residual deficits: Secondary | ICD-10-CM | POA: Insufficient documentation

## 2022-05-26 DIAGNOSIS — N182 Chronic kidney disease, stage 2 (mild): Secondary | ICD-10-CM | POA: Diagnosis not present

## 2022-05-26 DIAGNOSIS — E785 Hyperlipidemia, unspecified: Secondary | ICD-10-CM | POA: Diagnosis not present

## 2022-05-26 DIAGNOSIS — I129 Hypertensive chronic kidney disease with stage 1 through stage 4 chronic kidney disease, or unspecified chronic kidney disease: Secondary | ICD-10-CM | POA: Insufficient documentation

## 2022-05-26 DIAGNOSIS — F32A Depression, unspecified: Secondary | ICD-10-CM | POA: Diagnosis not present

## 2022-05-26 DIAGNOSIS — F419 Anxiety disorder, unspecified: Secondary | ICD-10-CM | POA: Insufficient documentation

## 2022-05-26 DIAGNOSIS — K219 Gastro-esophageal reflux disease without esophagitis: Secondary | ICD-10-CM | POA: Diagnosis not present

## 2022-05-26 DIAGNOSIS — G8929 Other chronic pain: Secondary | ICD-10-CM | POA: Insufficient documentation

## 2022-05-26 DIAGNOSIS — M810 Age-related osteoporosis without current pathological fracture: Secondary | ICD-10-CM | POA: Diagnosis not present

## 2022-05-26 DIAGNOSIS — I2510 Atherosclerotic heart disease of native coronary artery without angina pectoris: Secondary | ICD-10-CM | POA: Insufficient documentation

## 2022-05-26 DIAGNOSIS — Z452 Encounter for adjustment and management of vascular access device: Secondary | ICD-10-CM | POA: Diagnosis not present

## 2022-05-26 DIAGNOSIS — E0590 Thyrotoxicosis, unspecified without thyrotoxic crisis or storm: Secondary | ICD-10-CM | POA: Diagnosis not present

## 2022-05-26 HISTORY — PX: IR IMAGING GUIDED PORT INSERTION: IMG5740

## 2022-05-26 MED ORDER — HEPARIN SOD (PORK) LOCK FLUSH 100 UNIT/ML IV SOLN
INTRAVENOUS | Status: AC
  Administered 2022-05-26: 500 [IU] via INTRAVENOUS
  Filled 2022-05-26: qty 5

## 2022-05-26 MED ORDER — MIDAZOLAM HCL 2 MG/2ML IJ SOLN
INTRAMUSCULAR | Status: AC | PRN
  Administered 2022-05-26: 1 mg via INTRAVENOUS

## 2022-05-26 MED ORDER — LIDOCAINE-EPINEPHRINE 1 %-1:100000 IJ SOLN
INTRAMUSCULAR | Status: AC
  Administered 2022-05-26: 20 mL via INTRADERMAL
  Filled 2022-05-26: qty 1

## 2022-05-26 MED ORDER — DIPHENHYDRAMINE HCL 50 MG/ML IJ SOLN
INTRAMUSCULAR | Status: AC | PRN
  Administered 2022-05-26: 25 mg via INTRAVENOUS

## 2022-05-26 MED ORDER — DIPHENHYDRAMINE HCL 50 MG/ML IJ SOLN
INTRAMUSCULAR | Status: AC
  Filled 2022-05-26: qty 1

## 2022-05-26 MED ORDER — FENTANYL CITRATE (PF) 100 MCG/2ML IJ SOLN
INTRAMUSCULAR | Status: AC | PRN
  Administered 2022-05-26: 50 ug via INTRAVENOUS

## 2022-05-26 MED ORDER — SODIUM CHLORIDE 0.9 % IV SOLN: INTRAVENOUS | Status: DC

## 2022-05-26 MED ORDER — MIDAZOLAM HCL 2 MG/2ML IJ SOLN
INTRAMUSCULAR | Status: AC
  Filled 2022-05-26: qty 2

## 2022-05-26 MED ORDER — FENTANYL CITRATE (PF) 100 MCG/2ML IJ SOLN
INTRAMUSCULAR | Status: AC
  Filled 2022-05-26: qty 2

## 2022-05-26 NOTE — Discharge Instructions (Addendum)
For questions /concerns may call [facility] at [phone]   You may remove your dressing and shower 24-48 HOURS.  DO NOT use creams, ointments, or lotions to site (including EMLA cream) for 2 weeks or until site is healed.  The cream will remove surgical glue on your incision.    Your port is ready for immediate use.  Implanted Port Insertion, Care After This sheet gives you information about how to care for yourself after your procedure. Your health care provider may also give you more specific instructions. If you have problems or questions, contact your health careprovider. What can I expect after the procedure? After the procedure, it is common to have: Discomfort at the port insertion site. Bruising on the skin over the port. This should improve over 3-4 days. Follow these instructions at home: [facility] care After your port is placed, you will get a manufacturer's information card. The card has information about your port. Keep this card with you at all times. Take care of the port as told by your health care provider. Ask your health care provider if you or a family member can get training for taking care of the port at home. A home health care nurse may also take care of the port. Make sure to remember what type of port you have. Incision care Follow instructions from your health care provider about how to take care of your port insertion site. Make sure you: Wash your hands with soap and water before and after you change your bandage (dressing). If soap and water are not available, use hand sanitizer. Change your dressing as told by your health care provider. Leave skin glue, or adhesive strips in place. These skin closures may need to stay in place for 2 weeks or longer.  Check your port insertion site every day for signs of infection. Check for:      - Redness, swelling, or pain.                     - Fluid or blood.      - Warmth.      - Pus or a bad  smell. Activity Return to your normal activities as told by your health care provider. Ask your health care provider what activities are safe for you. Do not lift anything that is heavier than 10 lb (4.5 kg), or the limit that you are told, until your health care provider says that it is safe. General instructions Take over-the-counter and prescription medicines only as told by your health care provider. Do not take baths, swim, or use a hot tub until your health care provider approves. Ask your health care provider if you may take showers. You may only be allowed to take sponge baths. Do not drive for 24 hours if you were given a sedative during your procedure. Wear a medical alert bracelet in case of an emergency. This will tell any health care providers that you have a port. Keep all follow-up visits as told by your health care provider. This is important. Contact a health care provider if: You cannot flush your port with saline as directed, or you cannot draw blood from the port. You have a fever or chills. You have redness, swelling, or pain around your port insertion site. You have fluid or blood coming from your port insertion site. Your port insertion site feels warm to the touch. You have pus or a bad smell coming from the port insertion site.  Get help right away if: You have chest pain or shortness of breath. You have bleeding from your port that you cannot control. Summary Take care of the port as told by your health care provider. Keep the manufacturer's information card with you at all times. Change your dressing as told by your health care provider. Contact a health care provider if you have a fever or chills or if you have redness, swelling, or pain around your port insertion site. Keep all follow-up visits as told by your health care provider. This information is not intended to replace advice given to you by your health care provider. Make sure you discuss any questions you  have with your healthcare provider. Document Revised: 09/08/2017 Document Reviewed: 09/08/2017  Moderate Conscious Sedation, Adult, Care After This sheet gives you information about how to care for yourself after your procedure. Your health care provider may also give you more specific instructions. If you have problems or questions, contact your health careprovider. What can I expect after the procedure? After the procedure, it is common to have: Sleepiness for several hours. Impaired judgment for several hours. Difficulty with balance. Vomiting if you eat too soon. Follow these instructions at home: For the time period you were told by your health care provider: Rest. Do not participate in activities where you could fall or become injured. Do not drive or use machinery. Do not drink alcohol. Do not take sleeping pills or medicines that cause drowsiness. Do not make important decisions or sign legal documents. Do not take care of children on your own. Eating and drinking  Follow the diet recommended by your health care provider. Drink enough fluid to keep your urine pale yellow. If you vomit: Drink water, juice, or soup when you can drink without vomiting. Make sure you have little or no nausea before eating solid foods.  General instructions Take over-the-counter and prescription medicines only as told by your health care provider. Have a responsible adult stay with you for the time you are told. It is important to have someone help care for you until you are awake and alert. Do not smoke. Keep all follow-up visits as told by your health care provider. This is important. Contact a health care provider if: You are still sleepy or having trouble with balance after 24 hours. You feel light-headed. You keep feeling nauseous or you keep vomiting. You develop a rash. You have a fever. You have redness or swelling around the IV site. Get help right away if: You have trouble  breathing. You have new-onset confusion at home. Summary After the procedure, it is common to feel sleepy, have impaired judgment, or feel nauseous if you eat too soon. Rest after you get home. Know the things you should not do after the procedure. Follow the diet recommended by your health care provider and drink enough fluid to keep your urine pale yellow. Get help right away if you have trouble breathing or new-onset confusion at home. This information is not intended to replace advice given to you by your health care provider. Make sure you discuss any questions you have with your healthcare provider. Document Revised: 06/10/2019 Document Reviewed: 01/06/2019 Elsevier Patient Education  2022 [employer].

## 2022-05-26 NOTE — Procedures (Signed)
  Procedure:  R IJ port catheter placement   Preprocedure diagnosis: The encounter diagnosis was Malignant neoplasm of ovary, unspecified laterality. Postprocedure diagnosis: same EBL:    minimal Complications:   none immediate  See full dictation in [employer].  Dillard Cannon MD Main # [phone] Pager  [phone] Mobile [phone]

## 2022-05-27 ENCOUNTER — Other Ambulatory Visit: Payer: Self-pay

## 2022-05-28 MED FILL — Fosaprepitant Dimeglumine For IV Infusion 150 MG (Base Eq): INTRAVENOUS | Qty: 5 | Status: AC

## 2022-05-28 MED FILL — Dexamethasone Sodium Phosphate Inj 100 MG/10ML: INTRAMUSCULAR | Qty: 1 | Status: AC

## 2022-05-29 ENCOUNTER — Encounter: Payer: Self-pay | Admitting: Hematology and Oncology

## 2022-05-29 ENCOUNTER — Inpatient Hospital Stay: Payer: Medicare HMO

## 2022-05-29 ENCOUNTER — Inpatient Hospital Stay (HOSPITAL_BASED_OUTPATIENT_CLINIC_OR_DEPARTMENT_OTHER): Payer: Medicare HMO | Admitting: Hematology and Oncology

## 2022-05-29 ENCOUNTER — Inpatient Hospital Stay: Payer: Medicare HMO | Attending: Psychiatry

## 2022-05-29 ENCOUNTER — Encounter: Payer: Self-pay | Admitting: General Practice

## 2022-05-29 VITALS — BP 121/62 | HR 101 | Temp 98.0°F | Resp 18 | Ht 63.0 in | Wt 134.6 lb

## 2022-05-29 VITALS — HR 88

## 2022-05-29 DIAGNOSIS — T402X5A Adverse effect of other opioids, initial encounter: Secondary | ICD-10-CM

## 2022-05-29 DIAGNOSIS — C569 Malignant neoplasm of unspecified ovary: Secondary | ICD-10-CM

## 2022-05-29 DIAGNOSIS — T50905A Adverse effect of unspecified drugs, medicaments and biological substances, initial encounter: Secondary | ICD-10-CM | POA: Diagnosis not present

## 2022-05-29 DIAGNOSIS — I2510 Atherosclerotic heart disease of native coronary artery without angina pectoris: Secondary | ICD-10-CM | POA: Diagnosis not present

## 2022-05-29 DIAGNOSIS — Z5111 Encounter for antineoplastic chemotherapy: Secondary | ICD-10-CM | POA: Insufficient documentation

## 2022-05-29 DIAGNOSIS — Z79899 Other long term (current) drug therapy: Secondary | ICD-10-CM | POA: Insufficient documentation

## 2022-05-29 DIAGNOSIS — R739 Hyperglycemia, unspecified: Secondary | ICD-10-CM | POA: Diagnosis not present

## 2022-05-29 DIAGNOSIS — K5903 Drug induced constipation: Secondary | ICD-10-CM

## 2022-05-29 DIAGNOSIS — C786 Secondary malignant neoplasm of retroperitoneum and peritoneum: Secondary | ICD-10-CM | POA: Diagnosis not present

## 2022-05-29 DIAGNOSIS — C541 Malignant neoplasm of endometrium: Secondary | ICD-10-CM | POA: Diagnosis not present

## 2022-05-29 LAB — CMP (CANCER CENTER ONLY)
ALT: 7 U/L (ref 0–44)
AST: 11 U/L — ABNORMAL LOW (ref 15–41)
Albumin: 4 g/dL (ref 3.5–5.0)
Alkaline Phosphatase: 113 U/L (ref 38–126)
Anion gap: 9 (ref 5–15)
BUN: 26 mg/dL — ABNORMAL HIGH (ref 8–23)
CO2: 26 mmol/L (ref 22–32)
Calcium: 10.6 mg/dL — ABNORMAL HIGH (ref 8.9–10.3)
Chloride: 104 mmol/L (ref 98–111)
Creatinine: 0.6 mg/dL (ref 0.44–1.00)
GFR, Estimated: >60 mL/min (ref >60)
Glucose, Bld: 273 mg/dL — ABNORMAL HIGH (ref 70–99)
Potassium: 4.1 mmol/L (ref 3.5–5.1)
Sodium: 139 mmol/L (ref 135–145)
Total Bilirubin: 0.5 mg/dL (ref 0.3–1.2)
Total Protein: 7.2 g/dL (ref 6.5–8.1)

## 2022-05-29 LAB — CBC WITH DIFFERENTIAL (CANCER CENTER ONLY)
Abs Immature Granulocytes: 0 10*3/uL (ref 0.00–0.07)
Basophils Absolute: 0 10*3/uL (ref 0.0–0.1)
Basophils Relative: 0 %
Eosinophils Absolute: 0 10*3/uL (ref 0.0–0.5)
Eosinophils Relative: 0 %
HCT: 37.9 % (ref 36.0–46.0)
Hemoglobin: 12 g/dL (ref 12.0–15.0)
Immature Granulocytes: 0 %
Lymphocytes Relative: 21 %
Lymphs Abs: 0.8 10*3/uL (ref 0.7–4.0)
MCH: 29.2 pg (ref 26.0–34.0)
MCHC: 31.7 g/dL (ref 30.0–36.0)
MCV: 92.2 fL (ref 80.0–100.0)
Monocytes Absolute: 0 10*3/uL — ABNORMAL LOW (ref 0.1–1.0)
Monocytes Relative: 1 %
Neutro Abs: 3.2 10*3/uL (ref 1.7–7.7)
Neutrophils Relative %: 78 %
Platelet Count: 200 10*3/uL (ref 150–400)
RBC: 4.11 MIL/uL (ref 3.87–5.11)
RDW: 14.7 % (ref 11.5–15.5)
WBC Count: 4 10*3/uL (ref 4.0–10.5)
nRBC: 0 % (ref 0.0–0.2)

## 2022-05-29 MED ORDER — SODIUM CHLORIDE 0.9 % IV SOLN
150.0000 mg | Freq: Once | INTRAVENOUS | Status: AC
  Administered 2022-05-29: 150 mg via INTRAVENOUS
  Filled 2022-05-29: qty 150

## 2022-05-29 MED ORDER — FAMOTIDINE IN NACL 20-0.9 MG/50ML-% IV SOLN
20.0000 mg | Freq: Once | INTRAVENOUS | Status: AC
  Administered 2022-05-29: 20 mg via INTRAVENOUS
  Filled 2022-05-29: qty 50

## 2022-05-29 MED ORDER — INSULIN ASPART 100 UNIT/ML IJ SOLN
8.0000 [IU] | Freq: Once | INTRAMUSCULAR | Status: AC
  Administered 2022-05-29: 8 [IU] via SUBCUTANEOUS
  Filled 2022-05-29: qty 1

## 2022-05-29 MED ORDER — PALONOSETRON HCL INJECTION 0.25 MG/5ML
0.2500 mg | Freq: Once | INTRAVENOUS | Status: AC
  Administered 2022-05-29: 0.25 mg via INTRAVENOUS
  Filled 2022-05-29: qty 5

## 2022-05-29 MED ORDER — SODIUM CHLORIDE 0.9% FLUSH
10.0000 mL | Freq: Once | INTRAVENOUS | Status: AC
  Administered 2022-05-29: 10 mL

## 2022-05-29 MED ORDER — SODIUM CHLORIDE 0.9 % IV SOLN
105.0000 mg/m2 | Freq: Once | INTRAVENOUS | Status: AC
  Administered 2022-05-29: 174 mg via INTRAVENOUS
  Filled 2022-05-29: qty 29

## 2022-05-29 MED ORDER — SODIUM CHLORIDE 0.9 % IV SOLN: Freq: Once | INTRAVENOUS | Status: AC

## 2022-05-29 MED ORDER — SODIUM CHLORIDE 0.9 % IV SOLN
10.0000 mg | Freq: Once | INTRAVENOUS | Status: AC
  Administered 2022-05-29: 10 mg via INTRAVENOUS
  Filled 2022-05-29: qty 10

## 2022-05-29 MED ORDER — SODIUM CHLORIDE 0.9 % IV SOLN
403.2000 mg | Freq: Once | INTRAVENOUS | Status: AC
  Administered 2022-05-29: 400 mg via INTRAVENOUS
  Filled 2022-05-29: qty 40

## 2022-05-29 MED ORDER — CETIRIZINE HCL 10 MG/ML IV SOLN
10.0000 mg | Freq: Once | INTRAVENOUS | Status: AC
  Administered 2022-05-29: 10 mg via INTRAVENOUS
  Filled 2022-05-29: qty 1

## 2022-05-29 NOTE — Progress Notes (Signed)
[place] Spiritual Care Note  Followed up with Alexandria Mclaughlin in infusion as she engaged in meaningful storytelling and life review. She has traveled all over the [place] and the world, while also coping with significant losses.  Provided empathic listening, affirmation of strengths, spiritual/emotional support, and prayer per request. We plan to follow up at a future treatment.   [place], [place], [facility] Pager [phone] Voicemail [phone]

## 2022-05-29 NOTE — Patient Instructions (Signed)
[facility]  Discharge Instructions: Thank you for choosing [facility] to provide your oncology and hematology care.   If you have a lab appointment with the Cancer Center, please go directly to the Cancer Center and check in at the registration area.   Wear comfortable clothing and clothing appropriate for easy access to any Portacath or PICC line.   We strive to give you quality time with your provider. You may need to reschedule your appointment if you arrive late (15 or more minutes).  Arriving late affects you and other patients whose appointments are after yours.  Also, if you miss three or more appointments without notifying the office, you may be dismissed from the clinic at the provider's discretion.      For prescription refill requests, have your pharmacy contact our office and allow 72 hours for refills to be completed.    Today you received the following chemotherapy and/or immunotherapy agents: paclitaxel and carboplatin      To help prevent nausea and vomiting after your treatment, we encourage you to take your nausea medication as directed.  BELOW ARE SYMPTOMS THAT SHOULD BE REPORTED IMMEDIATELY: *FEVER GREATER THAN 100.4 F (38 C) OR HIGHER *CHILLS OR SWEATING *NAUSEA AND VOMITING THAT IS NOT CONTROLLED WITH YOUR NAUSEA MEDICATION *UNUSUAL SHORTNESS OF BREATH *UNUSUAL BRUISING OR BLEEDING *URINARY PROBLEMS (pain or burning when urinating, or frequent urination) *BOWEL PROBLEMS (unusual diarrhea, constipation, pain near the anus) TENDERNESS IN MOUTH AND THROAT WITH OR WITHOUT PRESENCE OF ULCERS (sore throat, sores in mouth, or a toothache) UNUSUAL RASH, SWELLING OR PAIN  UNUSUAL VAGINAL DISCHARGE OR ITCHING   Items with * indicate a potential emergency and should be followed up as soon as possible or go to the Emergency Department if any problems should occur.  Please show the CHEMOTHERAPY ALERT CARD or IMMUNOTHERAPY  ALERT CARD at check-in to the Emergency Department and triage nurse.  Should you have questions after your visit or need to cancel or reschedule your appointment, please contact [facility]  Dept: [phone]  and follow the prompts.  Office hours are 8:00 a.m. to 4:30 p.m. Monday - Friday. Please note that voicemails left after 4:00 p.m. may not be returned until the following business day.  We are closed weekends and major holidays. You have access to a nurse at all times for urgent questions. Please call the main number to the clinic Dept: [phone] and follow the prompts.   For any non-urgent questions, you may also contact your provider using MyChart. We now offer e-Visits for anyone 18 and older to request care online for non-urgent symptoms. For details visit [email or web address].   Also download the MyChart app! Go to the app store, search "MyChart", open the app, select [facility], and log in with your MyChart username and password.   

## 2022-05-30 ENCOUNTER — Other Ambulatory Visit: Payer: Self-pay

## 2022-05-30 ENCOUNTER — Telehealth: Payer: Self-pay | Admitting: Registered Nurse

## 2022-05-30 ENCOUNTER — Encounter: Payer: Self-pay | Admitting: Hematology and Oncology

## 2022-05-30 DIAGNOSIS — T50905A Adverse effect of unspecified drugs, medicaments and biological substances, initial encounter: Secondary | ICD-10-CM | POA: Insufficient documentation

## 2022-05-30 LAB — CA 125: Cancer Antigen (CA) 125: 93.5 U/mL — ABNORMAL HIGH (ref 0.0–38.1)

## 2022-05-30 MED ORDER — FENTANYL 25 MCG/HR TD PT72: 1.0000 | MEDICATED_PATCH | TRANSDERMAL | 0 refills | Status: DC

## 2022-05-30 NOTE — Assessment & Plan Note (Signed)
This is likely caused by steroid treatment She will get 1 dose of insulin We discussed importance of adequate hydration and dietary modification

## 2022-05-30 NOTE — Progress Notes (Signed)
[facility] OFFICE PROGRESS NOTE  Patient Care Team: Georgianne Fickamachandran, Ajith, MD as PCP - General (Internal Medicine) Parke PoissonAcharya, Gayatri A, MD as PCP - Cardiology (Cardiology) Drema DallasJaffe, Adam R, DO as Consulting Physician (Neurology)  ASSESSMENT & PLAN:  Ovarian cancer [facility](HCC) So far, she tolerated treatment very well without major side effects We discussed the risk and benefits of increasing the dose of treatment Ultimately, she would like to try at higher dose of therapy I will adjust the dose of her chemotherapy today  Therapeutic opioid induced constipation This is improved with aggressive laxative therapy She will continue the same  Drug-induced hyperglycemia This is likely caused by steroid treatment She will get 1 dose of insulin We discussed importance of adequate hydration and dietary modification  No orders of the defined types were placed in this encounter.   All questions were answered. The patient knows to call the clinic with any problems, questions or concerns. The total time spent in the appointment was 40 minutes encounter with patients including review of chart and various tests results, discussions about plan of care and coordination of care plan   Artis DelayNi Wyatt Thorstenson, MD 05/30/2022 2:54 PM  INTERVAL HISTORY: Please see below for problem oriented charting. she returns for treatment follow-up with her son She is doing well She tolerated treatment very well Denies recent constipation or nausea No peripheral neuropathy We discussed risk and benefits of changing the dose of her treatment  REVIEW OF SYSTEMS:   Constitutional: Denies fevers, chills or abnormal weight loss Eyes: Denies blurriness of vision Ears, nose, mouth, throat, and face: Denies mucositis or sore throat Respiratory: Denies cough, dyspnea or wheezes Cardiovascular: Denies palpitation, chest discomfort or lower extremity swelling Gastrointestinal:  Denies nausea, heartburn or change in bowel  habits Skin: Denies abnormal skin rashes Lymphatics: Denies new lymphadenopathy or easy bruising Neurological:Denies numbness, tingling or new weaknesses Behavioral/Psych: Mood is stable, no new changes  All other systems were reviewed with the patient and are negative.  I have reviewed the past medical history, past surgical history, social history and family history with the patient and they are unchanged from previous note.  ALLERGIES:  has No Known Allergies.  MEDICATIONS:  Current Outpatient Medications  Medication Sig Dispense Refill   aspirin-acetaminophen-caffeine (EXCEDRIN MIGRAINE) 250-250-65 MG tablet Take 1 tablet by mouth every 6 (six) hours as needed for headache or migraine.     Cholecalciferol (VITAMIN D) 50 MCG (2000 UT) CAPS Take 2,000 Units by mouth daily.     clonazePAM (KLONOPIN) 0.5 MG tablet TAKE 1 TABLET BY MOUTH AT BEDTIME AS NEEDED ANXIETY 30 tablet 2   cyanocobalamin 1000 MCG tablet 1,000 mcg daily.     dexamethasone (DECADRON) 4 MG tablet Take 2 tabs at the night before and 2 tab the morning of chemotherapy, every 3 weeks, by mouth x 6 cycles 24 tablet 6   DULoxetine (CYMBALTA) 60 MG capsule Take 60 mg by mouth daily.     fentaNYL (DURAGESIC) 25 MCG/HR Place 1 patch onto the skin every 3 (three) days. 10 patch 0   gabapentin (NEURONTIN) 600 MG tablet TAKE 1 TABLET BY MOUTH FOUR TIMES A DAY 360 tablet 1   irbesartan (AVAPRO) 150 MG tablet Take 150 mg by mouth daily.     lactulose (CHRONULAC) 10 GM/15ML solution Take 15 mLs (10 g total) by mouth 3 (three) times daily. 473 mL 1   lidocaine-prilocaine (EMLA) cream Apply to affected area once 30 g 3   magnesium oxide (MAG-OX) 400 (  240 Mg) MG tablet Take 1 tablet (400 mg total) by mouth daily. 30 tablet 1   Melatonin 10 MG TABS Take 10 mg by mouth at bedtime as needed (sleep).     methimazole (TAPAZOLE) 5 MG tablet Take 5 mg by mouth 2 (two) times a week.     ondansetron (ZOFRAN) 8 MG tablet Take 1 tablet (8 mg  total) by mouth every 8 (eight) hours as needed for nausea or vomiting. Start on the third day after carboplatin. 30 tablet 1   oxyCODONE-acetaminophen (PERCOCET) 10-325 MG tablet Take 1 tablet by mouth 5 (five) times daily as needed for pain. 150 tablet 0   polyethylene glycol (MIRALAX / GLYCOLAX) 17 g packet Take 17 g by mouth 2 (two) times daily.     prochlorperazine (COMPAZINE) 10 MG tablet Take 1 tablet (10 mg total) by mouth every 6 (six) hours as needed for nausea or vomiting. 30 tablet 1   rosuvastatin (CRESTOR) 40 MG tablet Take 1 tablet (40 mg total) by mouth at bedtime. 90 tablet 3   senna (SENOKOT) 8.6 MG tablet Take 2 tablets by mouth 2 (two) times daily.     traZODone (DESYREL) 100 MG tablet TAKE 1 TABLET BY MOUTH EVERYDAY AT BEDTIME 90 tablet 2   No current facility-administered medications for this visit.    SUMMARY OF ONCOLOGIC HISTORY: Oncology History Overview Note  High grade serous   Ovarian cancer  03/17/2022 Imaging   CT Abdomen/Pelvis: IMPRESSION: 1. Heterogeneous right adnexal mass measuring 3.8 x 3.0 cm with associated soft tissue thickening along the peritoneal reflections and greater omentum. Findings are highly suspicious for ovarian neoplasm with peritoneal carcinomatosis. 2. No evidence of bowel obstruction. 3. Coronary artery calcifications.   03/18/2022 Tumor Marker   CA125: 163   04/08/2022 Surgery   Diagnostic laparoscopy with peritoneal and omental biopsies  Findings: On entry to abdomen, omentum and large bowel broadly and densely adherent to the anterior abdominal wall in the midline from the level of the falciform ligament superiorly to the bladder inferiorly. Evidence of peritoneal plaques consistent with peritoneal carcinomatosis on the left diaphragm and left anterior abdominal wall and pelvic peritoneum. Right diaphragm and liver limited in visualization but no evidence of disease. Left liver lobe and stomach normal. Right abdomen not visualized  due to the midline adhesions. Pelvic mass not visualized due to adhesions. Omentum adherent to the anterior abdominal wall consistent with omental cake. Small bowel and mesentery that is able to be visualized does not appear to have disease. Initial peritoneal biopsy indeterminate for carcinoma on frozen. Additional biopsies obtained.    04/08/2022 Initial Diagnosis   Ovarian cancer (HCC)   04/08/2022 Pathology Results   A. PERITONEUM, BIOPSY:  Microscopic focus of adenocarcinoma.  See comment.   B. PERITONEUM, #2, BIOPSY:  Microscopic focus of adenocarcinoma.   C. OMENTUM, BIOPSY:  Adenocarcinoma.  See comment.   COMMENT:  A. The permanent recuts of the peritoneal biopsy submitted for frozen  section show a microscopic focus of adenocarcinoma which is not  identified in the frozen section slides.   C.  The omental specimen shows adenocarcinoma and immunohistochemistry  will be performed and reported as an addendum.  The morphologic features  favor an ovarian primary.   ADDENDUM:  Immunohistochemistry shows the carcinoma is positive with PAX8 and p16 with patchy positive staining with WT1 and estrogen receptor.  Progesterone receptor is negative.  P53 shows diffuse strong positivity (mutated pattern).  The morphology and immunophenotype are consistent with  high-grade serous carcinoma.    04/08/2022 Pathology Results   CYTOLOGY FINAL MICROSCOPIC DIAGNOSIS:  - Malignant cells consistent with adenocarcinoma  - See comment    04/17/2022 Cancer Staging   Staging form: Ovary, Fallopian Tube, and Primary Peritoneal Carcinoma, AJCC 8th Edition - Clinical: FIGO Stage IIIC (cT3c, cN0, cM0) - Signed by Artis DelayGorsuch, Llana Deshazo, MD on 04/17/2022 Stage prefix: Initial diagnosis   04/30/2022 -  Chemotherapy   Patient is on Treatment Plan : OVARIAN Carboplatin (AUC 6) + Paclitaxel (175) q21d X 6 Cycles     05/02/2022 Tumor Marker   Patient's tumor was tested for the following markers: CA-125. Results of  the tumor marker test revealed 193.   05/26/2022 Procedure   Technically successful right IJ power-injectable port catheter placement. Ready for routine use.     05/30/2022 Tumor Marker   Patient's tumor was tested for the following markers: CA-125. Results of the tumor marker test revealed 93.5.     PHYSICAL EXAMINATION: ECOG PERFORMANCE STATUS: 1 - Symptomatic but completely ambulatory  Vitals:   05/29/22 0820  BP: 121/62  Pulse: (!) 101  Resp: 18  Temp: 98 F (36.7 C)  SpO2: 96%   Filed Weights   05/29/22 0820  Weight: 134 lb 9.6 oz (61.1 kg)    GENERAL:alert, no distress and comfortable  NEURO: alert & oriented x 3 with fluent speech, no focal motor/sensory deficits  LABORATORY DATA:  I have reviewed the data as listed    Component Value Date/Time   NA 139 05/29/2022 0745   NA 139 09/19/2015 1422   K 4.1 05/29/2022 0745   CL 104 05/29/2022 0745   CO2 26 05/29/2022 0745   GLUCOSE 273 (H) 05/29/2022 0745   BUN 26 (H) 05/29/2022 0745   BUN 20 09/19/2015 1422   CREATININE 0.60 05/29/2022 0745   CALCIUM 10.6 (H) 05/29/2022 0745   PROT 7.2 05/29/2022 0745   PROT 7.0 09/19/2015 1422   ALBUMIN 4.0 05/29/2022 0745   ALBUMIN 4.6 09/19/2015 1422   AST 11 (L) 05/29/2022 0745   ALT 7 05/29/2022 0745   ALKPHOS 113 05/29/2022 0745   BILITOT 0.5 05/29/2022 0745   GFRNONAA >60 05/29/2022 0745   GFRAA >60 04/20/2018 1452    No results found for: "SPEP", "UPEP"  Lab Results  Component Value Date   WBC 4.0 05/29/2022   NEUTROABS 3.2 05/29/2022   HGB 12.0 05/29/2022   HCT 37.9 05/29/2022   MCV 92.2 05/29/2022   PLT 200 05/29/2022      Chemistry      Component Value Date/Time   NA 139 05/29/2022 0745   NA 139 09/19/2015 1422   K 4.1 05/29/2022 0745   CL 104 05/29/2022 0745   CO2 26 05/29/2022 0745   BUN 26 (H) 05/29/2022 0745   BUN 20 09/19/2015 1422   CREATININE 0.60 05/29/2022 0745      Component Value Date/Time   CALCIUM 10.6 (H) 05/29/2022 0745    ALKPHOS 113 05/29/2022 0745   AST 11 (L) 05/29/2022 0745   ALT 7 05/29/2022 0745   BILITOT 0.5 05/29/2022 0745

## 2022-05-30 NOTE — Assessment & Plan Note (Signed)
So far, she tolerated treatment very well without major side effects We discussed the risk and benefits of increasing the dose of treatment Ultimately, she would like to try at higher dose of therapy I will adjust the dose of her chemotherapy today

## 2022-05-30 NOTE — Telephone Encounter (Signed)
Patient is out of her Fentanyl Patches.  Needs a refill.  Please call  patient.

## 2022-05-30 NOTE — Telephone Encounter (Signed)
[facility] called:  They didn't have her Fentanyl prescription.  Fentanyl prescription was e-scribed. .  They are filling both of her prescriptions.  Call placed to Alexandria Mclaughlin, she verbalizes understanding.

## 2022-05-30 NOTE — Assessment & Plan Note (Signed)
This is improved with aggressive laxative therapy She will continue the same

## 2022-06-09 ENCOUNTER — Encounter: Payer: Self-pay | Admitting: Hematology and Oncology

## 2022-06-09 ENCOUNTER — Telehealth: Payer: Self-pay | Admitting: Hematology and Oncology

## 2022-06-09 ENCOUNTER — Other Ambulatory Visit: Payer: Self-pay | Admitting: Hematology and Oncology

## 2022-06-09 ENCOUNTER — Other Ambulatory Visit (HOSPITAL_COMMUNITY): Payer: Self-pay

## 2022-06-09 ENCOUNTER — Telehealth: Payer: Self-pay | Admitting: Genetic Counselor

## 2022-06-09 ENCOUNTER — Telehealth: Payer: Self-pay

## 2022-06-09 DIAGNOSIS — U071 COVID-19: Secondary | ICD-10-CM | POA: Insufficient documentation

## 2022-06-09 DIAGNOSIS — C569 Malignant neoplasm of unspecified ovary: Secondary | ICD-10-CM

## 2022-06-09 MED ORDER — MOLNUPIRAVIR EUA 200MG CAPSULE
4.0000 | ORAL_CAPSULE | Freq: Two times a day (BID) | ORAL | 0 refills | Status: AC
  Filled 2022-06-09 (×2): qty 40, 5d supply, fill #0

## 2022-06-09 NOTE — Telephone Encounter (Signed)
Returned her call. She tested positive for covid on 4/12 at home. She started have cold symptoms on Friday.  Today she is having voice hoarseness and cold symptoms. Denies fever.  She is requesting antiviral to pharmacy. Next treatment scheduled 4/26.

## 2022-06-09 NOTE — Telephone Encounter (Signed)
I sent lagevrio to [facility]. Local pharmacy typically would not stock it I will reschedule her appt

## 2022-06-09 NOTE — Telephone Encounter (Signed)
Called and given below message. Told her lagevrio  will be $1,000 per pharmacy. Paxlovid can cause risks of severe respiratory depression with her taking fentanyl. She cannot afford Rx and does not want lagevrio Rx. She will treat covid like a cold and call the office back for concerns/ questions.

## 2022-06-09 NOTE — Telephone Encounter (Signed)
Spoke with patient confirming appointment change  

## 2022-06-09 NOTE — Telephone Encounter (Signed)
Patient has COVID.  Rescheduled genetics appt for 4/29 at 3pm.

## 2022-06-12 ENCOUNTER — Inpatient Hospital Stay: Payer: Medicare HMO | Admitting: Genetic Counselor

## 2022-06-12 ENCOUNTER — Inpatient Hospital Stay: Payer: Medicare HMO | Admitting: Licensed Clinical Social Worker

## 2022-06-12 ENCOUNTER — Inpatient Hospital Stay: Payer: Medicare HMO

## 2022-06-16 ENCOUNTER — Ambulatory Visit (HOSPITAL_COMMUNITY): Payer: Medicare HMO | Attending: Internal Medicine

## 2022-06-16 DIAGNOSIS — I2584 Coronary atherosclerosis due to calcified coronary lesion: Secondary | ICD-10-CM | POA: Diagnosis not present

## 2022-06-16 DIAGNOSIS — I2510 Atherosclerotic heart disease of native coronary artery without angina pectoris: Secondary | ICD-10-CM | POA: Insufficient documentation

## 2022-06-16 DIAGNOSIS — R0609 Other forms of dyspnea: Secondary | ICD-10-CM | POA: Diagnosis not present

## 2022-06-16 LAB — ECHOCARDIOGRAM COMPLETE
Area-P 1/2: 2.52 cm2
S' Lateral: 1.8 cm

## 2022-06-17 ENCOUNTER — Inpatient Hospital Stay: Payer: Medicare HMO | Admitting: Licensed Clinical Social Worker

## 2022-06-17 ENCOUNTER — Other Ambulatory Visit: Payer: Self-pay | Admitting: Hematology and Oncology

## 2022-06-18 ENCOUNTER — Encounter: Payer: Self-pay | Admitting: Registered Nurse

## 2022-06-18 ENCOUNTER — Encounter: Payer: Medicare HMO | Attending: Physical Medicine and Rehabilitation | Admitting: Registered Nurse

## 2022-06-18 VITALS — BP 121/82 | HR 82 | Ht 63.0 in | Wt 130.0 lb

## 2022-06-18 DIAGNOSIS — Z5181 Encounter for therapeutic drug level monitoring: Secondary | ICD-10-CM | POA: Diagnosis not present

## 2022-06-18 DIAGNOSIS — G253 Myoclonus: Secondary | ICD-10-CM | POA: Diagnosis not present

## 2022-06-18 DIAGNOSIS — G894 Chronic pain syndrome: Secondary | ICD-10-CM | POA: Diagnosis not present

## 2022-06-18 DIAGNOSIS — M47816 Spondylosis without myelopathy or radiculopathy, lumbar region: Secondary | ICD-10-CM | POA: Insufficient documentation

## 2022-06-18 DIAGNOSIS — Z79891 Long term (current) use of opiate analgesic: Secondary | ICD-10-CM | POA: Diagnosis not present

## 2022-06-18 MED ORDER — OXYCODONE-ACETAMINOPHEN 10-325 MG PO TABS: 1.0000 | ORAL_TABLET | Freq: Every day | ORAL | 0 refills | Status: DC | PRN

## 2022-06-18 MED ORDER — FENTANYL 25 MCG/HR TD PT72: 1.0000 | MEDICATED_PATCH | TRANSDERMAL | 0 refills | Status: DC

## 2022-06-18 NOTE — Progress Notes (Signed)
Subjective:    Patient ID: Alexandria Mclaughlin, female    DOB: 06/17/1940, 82 y.o.   MRN: [id number]  HPI: Alexandria Mclaughlin is a 82 y.o. female who returns for follow up appointment for chronic pain and medication refill. She states her pain is located in her lower back. She rates her pain 6. Her current exercise regime is walking and performing stretching exercises.  Alexandria Mclaughlin Morphine equivalent is 135.00 MME.   Last Oral Swab was Performed 02/29/2-24, it was consistent.     Pain Inventory Average Pain 6 Pain Right Now 6 My pain is constant, sharp, and stabbing  In the last 24 hours, has pain interfered with the following? General activity 6 Relation with others 6 Enjoyment of life 6 What TIME of day is your pain at its worst? daytime Sleep (in general) Fair  Pain is worse with: walking, sitting, and standing Pain improves with: rest, heat/ice, and medication Relief from Meds: 6  Family History  Problem Relation Age of Onset   Diabetes Mother    Heart disease Mother    Stroke Mother    Heart disease Father    Heart disease Brother    Colon cancer Neg Hx    Breast cancer Neg Hx    Ovarian cancer Neg Hx    Endometrial cancer Neg Hx    Pancreatic cancer Neg Hx    Prostate cancer Neg Hx    Social History   Socioeconomic History   Marital status: Widowed    Spouse name: Not on file   Number of children: Not on file   Years of education: 14   Highest education level: Associate degree: academic program  Occupational History   Occupation: Retired  Tobacco Use   Smoking status: Former    Packs/day: 0.25    Years: 15.00    Additional pack years: 0.00    Total pack years: 3.75    Types: Cigarettes    Quit date: 03/15/1968    Years since quitting: 54.2   Smokeless tobacco: Never  Vaping Use   Vaping Use: Never used  Substance and Sexual Activity   Alcohol use: Not Currently    Comment: occasional glass of wine   Drug use: Yes    Frequency: 20.0 times per week    Types:  Fentanyl, Hydrocodone   Sexual activity: Not Currently  Other Topics Concern   Not on file  Social History Narrative   Right handed   Drinks caffeine   Condo two story with Engineer, structural   Social Determinants of Health   Financial Resource Strain: Not on file  Food Insecurity: Not on file  Transportation Needs: Not on file  Physical Activity: Not on file  Stress: Not on file  Social Connections: Not on file   Past Surgical History:  Procedure Laterality Date   ABDOMINAL HYSTERECTOMY     Due to fibroids   BACK SURGERY  2011   lumbar Fusion   BLADDER SURGERY  1985   CESAREAN SECTION     x2   EYE SURGERY Bilateral    Cataract surgery with IOL   FOOT SURGERY Right    "just scraped the bone"   IR IMAGING GUIDED PORT INSERTION  05/26/2022   LAPAROSCOPY N/A 04/08/2022   Procedure: LAPAROSCOPY DIAGNOSTIC WITH BIOPSIES;  Surgeon: Clide Cliff, MD;  Location: [facility];  Service: Gynecology;  Laterality: N/A;   RETINAL DETACHMENT SURGERY Right 10/2011   TRANSFORAMINAL LUMBAR INTERBODY FUSION W/ MIS 1 LEVEL Right 04/26/2018  Procedure: Right Lumbar four-five Minimally invasive Transforaminal lumbar interbody fusion;  Surgeon: Jadene Pierini, MD;  Location: [facility];  Service: Neurosurgery;  Laterality: Right;   Past Surgical History:  Procedure Laterality Date   ABDOMINAL HYSTERECTOMY     Due to fibroids   BACK SURGERY  2011   lumbar Fusion   BLADDER SURGERY  1985   CESAREAN SECTION     x2   EYE SURGERY Bilateral    Cataract surgery with IOL   FOOT SURGERY Right    "just scraped the bone"   IR IMAGING GUIDED PORT INSERTION  05/26/2022   LAPAROSCOPY N/A 04/08/2022   Procedure: LAPAROSCOPY DIAGNOSTIC WITH BIOPSIES;  Surgeon: Clide Cliff, MD;  Location: [facility];  Service: Gynecology;  Laterality: N/A;   RETINAL DETACHMENT SURGERY Right 10/2011   TRANSFORAMINAL LUMBAR INTERBODY FUSION W/ MIS 1 LEVEL Right 04/26/2018   Procedure: Right Lumbar four-five Minimally invasive  Transforaminal lumbar interbody fusion;  Surgeon: Jadene Pierini, MD;  Location: [facility];  Service: Neurosurgery;  Laterality: Right;   Past Medical History:  Diagnosis Date   Abnormality of gait 02/07/2015   Acquired hallux rigidus of left foot 12/17/2020   Acquired unequal leg length on left 08/08/2011   AMS (altered mental status) 12/28/2020   Biceps tendonitis on left 08/18/2018   BPPV (benign paroxysmal positional vertigo) 02/07/2015   Bunion 12/17/2020   Cancer 04/25/2022   Cerebrovascular disease    Cervical facet syndrome    Coronary artery disease 04/27/2022   Disorders of sacrum    Enthesopathy of hip region    Essential hypertension 02/07/2015   Generalized anxiety disorder    GERD (gastroesophageal reflux disease)    Headache    History of kidney stones    HLD (hyperlipidemia) 02/07/2015   Low back pain 02/07/2015   Lumbar facet arthropathy 07/05/2014   Lumbar post-laminectomy syndrome 06/11/2011   Lumbar radicular pain 07/29/2016   Lumbar radiculopathy 04/26/2018   Major depressive disorder    Mild cognitive impairment of uncertain or unknown etiology 01/24/2022   Multiple lacunar infarcts    MRI - bilateral basal ganglia and left thalamus   Myoclonus 09/19/2015   Rotator cuff tendonitis, left 06/08/2018   Sacroiliac joint dysfunction 06/11/2011   Sciatic neuritis    Stroke    hx TIA   Subcortical infarction    2016 MRI - small chronic subcortical infarct with associated chronic hemosiderin deposition within the right superior frontal gyrus.   Synovitis and tenosynovitis    Therapeutic opioid induced constipation 07/25/2015   Thyroid disease    Transient left leg weakness    Vision abnormalities    Ht  (1.6 m)   Wt 130 lb (59 kg)   BMI 23.03 kg/m   Opioid Risk Score:   Fall Risk Score:  `1  Depression screen PHQ 2/9     04/24/2022    1:11 PM 02/25/2022    1:02 PM 08/28/2021    2:31 PM 06/19/2021    2:53 PM 02/12/2021    2:41 PM 12/13/2020     1:55 PM 10/17/2020    1:34 PM  Depression screen PHQ 2/9  Decreased Interest 1 0 0 1 0 1 0  Down, Depressed, Hopeless 1 0 0 1 0 1 0  PHQ - 2 Score 2 0 0 2 0 2 0     Review of Systems  Musculoskeletal:  Positive for back pain.  All other systems reviewed and are negative.     Objective:  Physical Exam Vitals and nursing note reviewed.  Constitutional:      Appearance: Normal appearance.  Cardiovascular:     Rate and Rhythm: Normal rate and regular rhythm.     Pulses: Normal pulses.     Heart sounds: Normal heart sounds.  Pulmonary:     Effort: Pulmonary effort is normal.     Breath sounds: Normal breath sounds.  Musculoskeletal:     Cervical back: Normal range of motion and neck supple.     Comments: Normal Muscle Bulk and Muscle Testing Reveals:  Upper Extremities: Decreased ROM 45 Degrees  and Muscle Strength  5/5  Lumbar Paraspinal Tenderness: L-3-L-5 Lower Extremities: Full ROM and Muscle Strength 5/5 Arises from Table slowly using cane for support Narrow Based Gait     Skin:    General: Skin is warm and dry.  Neurological:     General: No focal deficit present.     Mental Status: She is alert and oriented to person, place, and time.  Psychiatric:        Mood and Affect: Mood normal.        Behavior: Behavior normal.         Assessment & Plan:  1. Facet Arthropathy/ Sacroiliac Joint Dysfunction: 06/18/2022. Refilled: Fentanyl 25 mcg patch  # 10- one patch every 3 days and   Percocet 10/325 mg one tablet 5 times a day as needed for pain #140 Second script  given for the following month.  Continue to Monitor. . We will continue the opioid monitoring program, this consists of regular clinic visits, examinations, urine drug screen, pill counts as well as use of [place] Controlled Substance Reporting system. A 12 month History has been reviewed on the [place] Controlled Substance Reporting System on 06/18/2022.  2. Lumbar Radicular  Pain: Continue HEP  as Tolerated. Continue to Monitor. Continue Gabapentin and Cymbalta. 06/18/2022 3. Gait Disorder: Continue HEP / Neurology Following. 04/ 24/2024 4. Myoclonus with sleep: Continue Klonopin. Continue current medication regimen. Continue to Monitor. 06/18/2022 5. Memory Changes:  Neurology:Following  Continue to Monitor. 06/18/2022 6. Right Shoulder Tendinitis: No complaints today.  Continue to Monitor. 06/18/2022  7. Malignant Neoplasm: Oncology Following. Continue to Monitor.   F/U in 2 months

## 2022-06-19 ENCOUNTER — Other Ambulatory Visit: Payer: Self-pay

## 2022-06-20 ENCOUNTER — Inpatient Hospital Stay: Payer: Medicare HMO | Admitting: Hematology and Oncology

## 2022-06-20 ENCOUNTER — Inpatient Hospital Stay: Payer: Medicare HMO

## 2022-06-23 ENCOUNTER — Other Ambulatory Visit: Payer: Self-pay | Admitting: Genetic Counselor

## 2022-06-23 ENCOUNTER — Inpatient Hospital Stay (HOSPITAL_BASED_OUTPATIENT_CLINIC_OR_DEPARTMENT_OTHER): Payer: Medicare HMO | Admitting: Genetic Counselor

## 2022-06-23 ENCOUNTER — Other Ambulatory Visit: Payer: Self-pay

## 2022-06-23 DIAGNOSIS — C569 Malignant neoplasm of unspecified ovary: Secondary | ICD-10-CM

## 2022-06-23 NOTE — Progress Notes (Signed)
REFERRING PROVIDER: Clide Cliff, MD [address] [place],  [place] [address]  PRIMARY PROVIDER:  Georgianne Fick, MD  PRIMARY REASON FOR VISIT:  1. Malignant neoplasm of ovary, unspecified laterality (HCC)      HISTORY OF PRESENT ILLNESS:   Alexandria Mclaughlin, a 82 y.o. female, was seen for a [facility] cancer genetics consultation at the request of Dr. Alvester Morin due to a personal history of ovarian cancer.  Alexandria Mclaughlin presents to clinic today to discuss the possibility of a hereditary predisposition to cancer, to discuss genetic testing, and to further clarify her future cancer risks, as well as potential cancer risks for family members.   In February 2024, at the age of 25, Alexandria Mclaughlin was diagnosed with   CANCER HISTORY:  Oncology History Overview Note  High grade serous   Ovarian cancer (HCC)  03/17/2022 Imaging   CT Abdomen/Pelvis: IMPRESSION: 1. Heterogeneous right adnexal mass measuring 3.8 x 3.0 cm with associated soft tissue thickening along the peritoneal reflections and greater omentum. Findings are highly suspicious for ovarian neoplasm with peritoneal carcinomatosis. 2. No evidence of bowel obstruction. 3. Coronary artery calcifications.   03/18/2022 Tumor Marker   CA125: 163   04/08/2022 Surgery   Diagnostic laparoscopy with peritoneal and omental biopsies  Findings: On entry to abdomen, omentum and large bowel broadly and densely adherent to the anterior abdominal wall in the midline from the level of the falciform ligament superiorly to the bladder inferiorly. Evidence of peritoneal plaques consistent with peritoneal carcinomatosis on the left diaphragm and left anterior abdominal wall and pelvic peritoneum. Right diaphragm and liver limited in visualization but no evidence of disease. Left liver lobe and stomach normal. Right abdomen not visualized due to the midline adhesions. Pelvic mass not visualized due to adhesions. Omentum adherent to the anterior abdominal wall  consistent with omental cake. Small bowel and mesentery that is able to be visualized does not appear to have disease. Initial peritoneal biopsy indeterminate for carcinoma on frozen. Additional biopsies obtained.    04/08/2022 Initial Diagnosis   Ovarian cancer (HCC)   04/08/2022 Pathology Results   A. PERITONEUM, BIOPSY:  Microscopic focus of adenocarcinoma.  See comment.   B. PERITONEUM, #2, BIOPSY:  Microscopic focus of adenocarcinoma.   C. OMENTUM, BIOPSY:  Adenocarcinoma.  See comment.   COMMENT:  A. The permanent recuts of the peritoneal biopsy submitted for frozen  section show a microscopic focus of adenocarcinoma which is not  identified in the frozen section slides.   C.  The omental specimen shows adenocarcinoma and immunohistochemistry  will be performed and reported as an addendum.  The morphologic features  favor an ovarian primary.   ADDENDUM:  Immunohistochemistry shows the carcinoma is positive with PAX8 and p16 with patchy positive staining with WT1 and estrogen receptor.  Progesterone receptor is negative.  P53 shows diffuse strong positivity (mutated pattern).  The morphology and immunophenotype are consistent with high-grade serous carcinoma.    04/08/2022 Pathology Results   CYTOLOGY FINAL MICROSCOPIC DIAGNOSIS:  - Malignant cells consistent with adenocarcinoma  - See comment    04/17/2022 Cancer Staging   Staging form: Ovary, Fallopian Tube, and Primary Peritoneal Carcinoma, AJCC 8th Edition - Clinical: FIGO Stage IIIC (cT3c, cN0, cM0) - Signed by Artis Delay, MD on 04/17/2022 Stage prefix: Initial diagnosis   04/30/2022 -  Chemotherapy   Patient is on Treatment Plan : OVARIAN Carboplatin (AUC 6) + Paclitaxel (175) q21d X 6 Cycles     05/02/2022 Tumor Marker  Patient's tumor was tested for the following markers: CA-125. Results of the tumor marker test revealed 193.   05/26/2022 Procedure   Technically successful right IJ power-injectable port  catheter placement. Ready for routine use.     05/30/2022 Tumor Marker   Patient's tumor was tested for the following markers: CA-125. Results of the tumor marker test revealed 93.5.       Past Medical History:  Diagnosis Date   Abnormality of gait 02/07/2015   Acquired hallux rigidus of left foot 12/17/2020   Acquired unequal leg length on left 08/08/2011   AMS (altered mental status) 12/28/2020   Biceps tendonitis on left 08/18/2018   BPPV (benign paroxysmal positional vertigo) 02/07/2015   Bunion 12/17/2020   Cancer (HCC) 04/25/2022   Cerebrovascular disease    Cervical facet syndrome    Coronary artery disease 04/27/2022   Disorders of sacrum    Enthesopathy of hip region    Essential hypertension 02/07/2015   Generalized anxiety disorder    GERD (gastroesophageal reflux disease)    Headache    History of kidney stones    HLD (hyperlipidemia) 02/07/2015   Low back pain 02/07/2015   Lumbar facet arthropathy 07/05/2014   Lumbar post-laminectomy syndrome 06/11/2011   Lumbar radicular pain 07/29/2016   Lumbar radiculopathy 04/26/2018   Major depressive disorder    Mild cognitive impairment of uncertain or unknown etiology 01/24/2022   Multiple lacunar infarcts    MRI - bilateral basal ganglia and left thalamus   Myoclonus 09/19/2015   Rotator cuff tendonitis, left 06/08/2018   Sacroiliac joint dysfunction 06/11/2011   Sciatic neuritis    Stroke (HCC)    hx TIA   Subcortical infarction    2016 MRI - small chronic subcortical infarct with associated chronic hemosiderin deposition within the right superior frontal gyrus.   Synovitis and tenosynovitis    Therapeutic opioid induced constipation 07/25/2015   Thyroid disease    Transient left leg weakness    Vision abnormalities     Past Surgical History:  Procedure Laterality Date   ABDOMINAL HYSTERECTOMY     Due to fibroids   BACK SURGERY  2011   lumbar Fusion   BLADDER SURGERY  1985   CESAREAN SECTION     x2    EYE SURGERY Bilateral    Cataract surgery with IOL   FOOT SURGERY Right    "just scraped the bone"   IR IMAGING GUIDED PORT INSERTION  05/26/2022   LAPAROSCOPY N/A 04/08/2022   Procedure: LAPAROSCOPY DIAGNOSTIC WITH BIOPSIES;  Surgeon: Clide Cliff, MD;  Location: [facility];  Service: Gynecology;  Laterality: N/A;   RETINAL DETACHMENT SURGERY Right 10/2011   TRANSFORAMINAL LUMBAR INTERBODY FUSION W/ MIS 1 LEVEL Right 04/26/2018   Procedure: Right Lumbar four-five Minimally invasive Transforaminal lumbar interbody fusion;  Surgeon: Jadene Pierini, MD;  Location: [facility];  Service: Neurosurgery;  Laterality: Right;    FAMILY HISTORY:  We obtained a 4-generation family history.  She did not report any known family history of cancer.       Alexandria Mclaughlin is unaware of previous family history of genetic testing for hereditary cancer risks. There is no reported Ashkenazi Jewish ancestry. There is no known consanguinity.  GENETIC COUNSELING ASSESSMENT: Alexandria Mclaughlin is a 82 y.o. female with a personal history of ovarian cancer. We, therefore, discussed and recommended the following at today's visit.   DISCUSSION: We discussed that 5 - 10% of cancer is hereditary.  Most cases of hereditary ovarian cancer are  associated with mutations in BRCA1/2.  There are other genes that can be associated with hereditary ovarian cancer syndromes.  We discussed that testing is beneficial for several reasons including knowing how to follow individuals for their cancer risks, identifying whether potential treatment options, such as PARP inhibitors, would be beneficial, and understanding if other family members could be at risk for cancer and allowing them to undergo genetic testing.   We reviewed the characteristics, features and inheritance patterns of hereditary cancer syndromes. We also discussed genetic testing, including the appropriate family members to test, the process of testing, insurance coverage and  turn-around-time for results. We discussed the implications of a negative, positive, carrier and/or variant of uncertain significant result. We recommended Alexandria Mclaughlin pursue genetic testing for a panel that includes genes associated with ovarian cancer as well as other caners.   The Multi-Cancer + RNA Panel offered by [facility] includes sequencing and/or deletion/duplication analysis of the following 70 genes:  AIP*, ALK, APC*, ATM*, AXIN2*, BAP1*, BARD1*, BLM*, BMPR1A*, BRCA1*, BRCA2*, BRIP1*, CDC73*, CDH1*, CDK4, CDKN1B*, CDKN2A, CHEK2*, CTNNA1*, DICER1*, EPCAM (del/dup only), EGFR, FH*, FLCN*, GREM1 (promoter dup only), HOXB13, KIT, LZTR1, MAX*, MBD4, MEN1*, MET, MITF, MLH1*, MSH2*, MSH3*, MSH6*, MUTYH*, NF1*, NF2*, NTHL1*, PALB2*, PDGFRA, PMS2*, POLD1*, POLE*, POT1*, PRKAR1A*, PTCH1*, PTEN*, RAD51C*, RAD51D*, RB1*, RET, SDHA* (sequencing only), SDHAF2*, SDHB*, SDHC*, SDHD*, SMAD4*, SMARCA4*, SMARCB1*, SMARCE1*, STK11*, SUFU*, TMEM127*, TP53*, TSC1*, TSC2*, VHL*. RNA analysis is performed for * genes.  Based on Alexandria Mclaughlin's personal history of ovarian cancer, she meets medical criteria for genetic testing. Despite that she meets criteria, she may still have an out of pocket cost. We discussed that if her out of pocket cost for testing is over $100, the laboratory should contact her and discuss the self-pay prices and/or patient pay assistance programs.    PLAN: After considering the risks, benefits, and limitations, Alexandria Mclaughlin provided informed consent to pursue genetic testing. Blood sample scheduled to be collected on 5/3 during her port flush w/ lab and sent to [facility] for analysis of the Multi-Cancer +RNA Panel. Results should be available within approximately 3 weeks' time, at which point they will be disclosed by telephone to Alexandria Mclaughlin and well as her daughter, Alexandria Mclaughlin, per Ms. Tipler's request.  This information will also be available in Epic.   Ms. Witts questions were answered to her satisfaction  today. Our contact information was provided should additional questions or concerns arise. Thank you for the referral and allowing [place] to share in the care of your patient.   Alexandria Romack M. Rennie Plowman, MS, [facility] Genetic Counselor [email or web address] (P) [phone]  The patient was seen for a total of 30 minutes in face-to-face genetic counseling.  The patient was seen alone.  Drs. Pamelia Hoit and/or Mosetta Putt were available to discuss this case as needed.    _______________________________________________________________________ For Office Staff:  Number of people involved in session: 1 Was an Intern/ student involved with case: no

## 2022-06-24 ENCOUNTER — Inpatient Hospital Stay: Payer: Medicare HMO | Admitting: Licensed Clinical Social Worker

## 2022-06-24 DIAGNOSIS — Z961 Presence of intraocular lens: Secondary | ICD-10-CM | POA: Diagnosis not present

## 2022-06-24 DIAGNOSIS — H59811 Chorioretinal scars after surgery for detachment, right eye: Secondary | ICD-10-CM | POA: Diagnosis not present

## 2022-06-24 DIAGNOSIS — H532 Diplopia: Secondary | ICD-10-CM | POA: Diagnosis not present

## 2022-06-24 DIAGNOSIS — H40023 Open angle with borderline findings, high risk, bilateral: Secondary | ICD-10-CM | POA: Diagnosis not present

## 2022-06-24 DIAGNOSIS — H353131 Nonexudative age-related macular degeneration, bilateral, early dry stage: Secondary | ICD-10-CM | POA: Diagnosis not present

## 2022-06-24 DIAGNOSIS — H35373 Puckering of macula, bilateral: Secondary | ICD-10-CM | POA: Diagnosis not present

## 2022-06-24 DIAGNOSIS — H43813 Vitreous degeneration, bilateral: Secondary | ICD-10-CM | POA: Diagnosis not present

## 2022-06-24 NOTE — Progress Notes (Signed)
[facility] CSW Counseling Note  Patient was referred by new patient protocol. Treatment type: Individual  Presenting Concerns: Patient and/or family reports the following symptoms/concerns: depression Duration of problem: since diagnosis (Jan 2024); Severity of problem: moderate   Orientation:oriented to person, place, time/date, and situation.   Affect: Appropriate and Congruent Risk of harm to self or others: No plan to harm self or others  Patient and/or Family's Strengths/Protective Factors: Ability for insight  Communication skills , supportive son     Goals Addressed: Patient will:  Reduce symptoms of: depression Increase knowledge and/or ability of: coping skills  Increase healthy adjustment to current life circumstances   Progress towards Goals: Progressing   Interventions: Interventions utilized:  CBT and Strength-based      Assessment: Patient expresses continues improvement in mood and coping. Pt is having normal worries about future and sometimes feels sad, but is engaging well with her friends (playing cards, visiting boyfriend) and family.  She is able to redirect focus and is not dwelling for extended periods on negative emotions.  Pt is worried that her genetic testing will reveal a mutation that she passed to her kids. Discussed ways to prepare for that possibility and potential coping and reframing.        Plan: Follow up with CSW: 4 weeks Behavioral recommendations: continue enjoyed daily activities. When having days and times of not thinking about cancer, instead of believing you are in denial, change to appreciation of having pain free moments. Referral(s): Chaplain       Virgen Belland E Jearl Soto, LCSW

## 2022-06-25 ENCOUNTER — Ambulatory Visit: Payer: Medicare HMO | Admitting: Registered Nurse

## 2022-06-26 ENCOUNTER — Telehealth: Payer: Self-pay

## 2022-06-26 MED FILL — Dexamethasone Sodium Phosphate Inj 100 MG/10ML: INTRAMUSCULAR | Qty: 1 | Status: AC

## 2022-06-26 MED FILL — Fosaprepitant Dimeglumine For IV Infusion 150 MG (Base Eq): INTRAVENOUS | Qty: 5 | Status: AC

## 2022-06-26 NOTE — Telephone Encounter (Signed)
Returned her call. She is asking if she should apply emla cream to port tomorrow. Told her yes. She appreciated the call.

## 2022-06-27 ENCOUNTER — Inpatient Hospital Stay: Payer: Medicare HMO | Attending: Psychiatry

## 2022-06-27 ENCOUNTER — Encounter: Payer: Self-pay | Admitting: General Practice

## 2022-06-27 ENCOUNTER — Inpatient Hospital Stay: Payer: Medicare HMO | Admitting: Hematology and Oncology

## 2022-06-27 ENCOUNTER — Encounter: Payer: Self-pay | Admitting: Hematology and Oncology

## 2022-06-27 ENCOUNTER — Inpatient Hospital Stay: Payer: Medicare HMO

## 2022-06-27 VITALS — BP 128/71 | HR 83 | Temp 98.0°F | Resp 18 | Ht 63.0 in | Wt 133.4 lb

## 2022-06-27 DIAGNOSIS — M5416 Radiculopathy, lumbar region: Secondary | ICD-10-CM | POA: Insufficient documentation

## 2022-06-27 DIAGNOSIS — C569 Malignant neoplasm of unspecified ovary: Secondary | ICD-10-CM

## 2022-06-27 DIAGNOSIS — Z79899 Other long term (current) drug therapy: Secondary | ICD-10-CM | POA: Insufficient documentation

## 2022-06-27 DIAGNOSIS — D61818 Other pancytopenia: Secondary | ICD-10-CM | POA: Diagnosis not present

## 2022-06-27 DIAGNOSIS — I2510 Atherosclerotic heart disease of native coronary artery without angina pectoris: Secondary | ICD-10-CM | POA: Diagnosis not present

## 2022-06-27 DIAGNOSIS — I700 Atherosclerosis of aorta: Secondary | ICD-10-CM | POA: Insufficient documentation

## 2022-06-27 DIAGNOSIS — Z5111 Encounter for antineoplastic chemotherapy: Secondary | ICD-10-CM | POA: Insufficient documentation

## 2022-06-27 DIAGNOSIS — C541 Malignant neoplasm of endometrium: Secondary | ICD-10-CM | POA: Diagnosis not present

## 2022-06-27 DIAGNOSIS — K5909 Other constipation: Secondary | ICD-10-CM | POA: Insufficient documentation

## 2022-06-27 DIAGNOSIS — K449 Diaphragmatic hernia without obstruction or gangrene: Secondary | ICD-10-CM | POA: Insufficient documentation

## 2022-06-27 DIAGNOSIS — G894 Chronic pain syndrome: Secondary | ICD-10-CM | POA: Insufficient documentation

## 2022-06-27 LAB — CMP (CANCER CENTER ONLY)
ALT: 9 U/L (ref 0–44)
AST: 14 U/L — ABNORMAL LOW (ref 15–41)
Albumin: 4.1 g/dL (ref 3.5–5.0)
Alkaline Phosphatase: 102 U/L (ref 38–126)
Anion gap: 8 (ref 5–15)
BUN: 21 mg/dL (ref 8–23)
CO2: 29 mmol/L (ref 22–32)
Calcium: 10.1 mg/dL (ref 8.9–10.3)
Chloride: 102 mmol/L (ref 98–111)
Creatinine: 0.52 mg/dL (ref 0.44–1.00)
GFR, Estimated: >60 mL/min (ref >60)
Glucose, Bld: 185 mg/dL — ABNORMAL HIGH (ref 70–99)
Potassium: 4.2 mmol/L (ref 3.5–5.1)
Sodium: 139 mmol/L (ref 135–145)
Total Bilirubin: 0.7 mg/dL (ref 0.3–1.2)
Total Protein: 7 g/dL (ref 6.5–8.1)

## 2022-06-27 LAB — CBC WITH DIFFERENTIAL (CANCER CENTER ONLY)
Abs Immature Granulocytes: 0.01 10*3/uL (ref 0.00–0.07)
Basophils Absolute: 0 10*3/uL (ref 0.0–0.1)
Basophils Relative: 0 %
Eosinophils Absolute: 0 10*3/uL (ref 0.0–0.5)
Eosinophils Relative: 0 %
HCT: 35.6 % — ABNORMAL LOW (ref 36.0–46.0)
Hemoglobin: 11.3 g/dL — ABNORMAL LOW (ref 12.0–15.0)
Immature Granulocytes: 0 %
Lymphocytes Relative: 28 %
Lymphs Abs: 0.7 10*3/uL (ref 0.7–4.0)
MCH: 29.6 pg (ref 26.0–34.0)
MCHC: 31.7 g/dL (ref 30.0–36.0)
MCV: 93.2 fL (ref 80.0–100.0)
Monocytes Absolute: 0 10*3/uL — ABNORMAL LOW (ref 0.1–1.0)
Monocytes Relative: 1 %
Neutro Abs: 1.8 10*3/uL (ref 1.7–7.7)
Neutrophils Relative %: 71 %
Platelet Count: 216 10*3/uL (ref 150–400)
RBC: 3.82 MIL/uL — ABNORMAL LOW (ref 3.87–5.11)
RDW: 15.6 % — ABNORMAL HIGH (ref 11.5–15.5)
WBC Count: 2.6 10*3/uL — ABNORMAL LOW (ref 4.0–10.5)
nRBC: 0 % (ref 0.0–0.2)

## 2022-06-27 MED ORDER — SODIUM CHLORIDE 0.9 % IV SOLN
105.0000 mg/m2 | Freq: Once | INTRAVENOUS | Status: AC
  Administered 2022-06-27: 174 mg via INTRAVENOUS
  Filled 2022-06-27: qty 29

## 2022-06-27 MED ORDER — FAMOTIDINE 20 MG IN NS 100 ML IVPB
20.0000 mg | Freq: Once | INTRAVENOUS | Status: AC
  Administered 2022-06-27: 20 mg via INTRAVENOUS
  Filled 2022-06-27: qty 100

## 2022-06-27 MED ORDER — SODIUM CHLORIDE 0.9 % IV SOLN
10.0000 mg | Freq: Once | INTRAVENOUS | Status: AC
  Administered 2022-06-27: 10 mg via INTRAVENOUS
  Filled 2022-06-27: qty 10

## 2022-06-27 MED ORDER — PALONOSETRON HCL INJECTION 0.25 MG/5ML
0.2500 mg | Freq: Once | INTRAVENOUS | Status: AC
  Administered 2022-06-27: 0.25 mg via INTRAVENOUS
  Filled 2022-06-27: qty 5

## 2022-06-27 MED ORDER — HEPARIN SOD (PORK) LOCK FLUSH 100 UNIT/ML IV SOLN
500.0000 [IU] | Freq: Once | INTRAVENOUS | Status: AC | PRN
  Administered 2022-06-27: 500 [IU]

## 2022-06-27 MED ORDER — SODIUM CHLORIDE 0.9 % IV SOLN
330.0000 mg | Freq: Once | INTRAVENOUS | Status: AC
  Administered 2022-06-27: 330 mg via INTRAVENOUS
  Filled 2022-06-27: qty 33

## 2022-06-27 MED ORDER — CETIRIZINE HCL 10 MG/ML IV SOLN
10.0000 mg | Freq: Once | INTRAVENOUS | Status: AC
  Administered 2022-06-27: 10 mg via INTRAVENOUS
  Filled 2022-06-27: qty 1

## 2022-06-27 MED ORDER — SODIUM CHLORIDE 0.9 % IV SOLN
150.0000 mg | Freq: Once | INTRAVENOUS | Status: AC
  Administered 2022-06-27: 150 mg via INTRAVENOUS
  Filled 2022-06-27: qty 150

## 2022-06-27 MED ORDER — SODIUM CHLORIDE 0.9% FLUSH
10.0000 mL | INTRAVENOUS | Status: DC | PRN
  Administered 2022-06-27: 10 mL

## 2022-06-27 MED ORDER — SODIUM CHLORIDE 0.9 % IV SOLN: Freq: Once | INTRAVENOUS | Status: AC

## 2022-06-27 NOTE — Progress Notes (Signed)
[facility] OFFICE PROGRESS NOTE  Patient Care Team: Georgianne Fick, MD as PCP - General (Internal Medicine) Parke Poisson, MD as PCP - Cardiology (Cardiology) Drema Dallas, DO as Consulting Physician (Neurology)  ASSESSMENT & PLAN:  Ovarian cancer [facility]) She developed some mild pancytopenia likely due to cumulative side effects of chemo We discussed risk and benefits of mild dose reduction versus lengthening the interval between treatment on both Ultimately, she is in agreement for mild dose reduction I plan to reduce the dose of carboplatin a little bit  Lumbar radiculopathy She had significant chronic pain syndrome related to problems with her back Her pain is poorly controlled at baseline So far, she denies worsening pain or peripheral neuropathy on treatment  Pancytopenia, acquired (HCC) This is due to side effects of treatment So far, she is not symptomatic I recommend mild dose reduction and we will proceed with treatment without delay  No orders of the defined types were placed in this encounter.   All questions were answered. The patient knows to call the clinic with any problems, questions or concerns. The total time spent in the appointment was 30 minutes encounter with patients including review of chart and various tests results, discussions about plan of care and coordination of care plan   Artis Delay, MD 06/27/2022 9:04 AM  INTERVAL HISTORY: Please see below for problem oriented charting. she returns for treatment follow-up with her son She is seen prior to cycle 3 She denies worsening neuropathy with recent Taxol dose change She is not symptomatic from anemia Her chronic constipation is stable  REVIEW OF SYSTEMS:   Constitutional: Denies fevers, chills or abnormal weight loss Eyes: Denies blurriness of vision Ears, nose, mouth, throat, and face: Denies mucositis or sore throat Respiratory: Denies cough, dyspnea or  wheezes Cardiovascular: Denies palpitation, chest discomfort or lower extremity swelling Gastrointestinal:  Denies nausea, heartburn or change in bowel habits Skin: Denies abnormal skin rashes Lymphatics: Denies new lymphadenopathy or easy bruising Neurological:Denies numbness, tingling or new weaknesses Behavioral/Psych: Mood is stable, no new changes  All other systems were reviewed with the patient and are negative.  I have reviewed the past medical history, past surgical history, social history and family history with the patient and they are unchanged from previous note.  ALLERGIES:  has No Known Allergies.  MEDICATIONS:  Current Outpatient Medications  Medication Sig Dispense Refill   aspirin-acetaminophen-caffeine (EXCEDRIN MIGRAINE) 250-250-65 MG tablet Take 1 tablet by mouth every 6 (six) hours as needed for headache or migraine.     Cholecalciferol (VITAMIN D) 50 MCG (2000 UT) CAPS Take 2,000 Units by mouth daily.     clonazePAM (KLONOPIN) 0.5 MG tablet TAKE 1 TABLET BY MOUTH AT BEDTIME AS NEEDED ANXIETY 30 tablet 2   cyanocobalamin 1000 MCG tablet 1,000 mcg daily.     dexamethasone (DECADRON) 4 MG tablet Take 2 tabs at the night before and 2 tab the morning of chemotherapy, every 3 weeks, by mouth x 6 cycles 24 tablet 6   DULoxetine (CYMBALTA) 60 MG capsule Take 60 mg by mouth daily.     fentaNYL (DURAGESIC) 25 MCG/HR Place 1 patch onto the skin every 3 (three) days. 10 patch 0   gabapentin (NEURONTIN) 600 MG tablet TAKE 1 TABLET BY MOUTH FOUR TIMES A DAY 360 tablet 1   irbesartan (AVAPRO) 150 MG tablet Take 150 mg by mouth daily.     lactulose (CHRONULAC) 10 GM/15ML solution Take 15 mLs (10 g total) by mouth  3 (three) times daily. 473 mL 1   lidocaine-prilocaine (EMLA) cream Apply to affected area once 30 g 3   magnesium oxide (MAG-OX) 400 (240 Mg) MG tablet TAKE 1 TABLET BY MOUTH EVERY DAY 30 tablet 1   Melatonin 10 MG TABS Take 10 mg by mouth at bedtime as needed (sleep).      methimazole (TAPAZOLE) 5 MG tablet Take 5 mg by mouth 2 (two) times a week.     ondansetron (ZOFRAN) 8 MG tablet Take 1 tablet (8 mg total) by mouth every 8 (eight) hours as needed for nausea or vomiting. Start on the third day after carboplatin. 30 tablet 1   oxyCODONE-acetaminophen (PERCOCET) 10-325 MG tablet Take 1 tablet by mouth 5 (five) times daily as needed for pain. 130 tablet 0   polyethylene glycol (MIRALAX / GLYCOLAX) 17 g packet Take 17 g by mouth 2 (two) times daily.     prochlorperazine (COMPAZINE) 10 MG tablet Take 1 tablet (10 mg total) by mouth every 6 (six) hours as needed for nausea or vomiting. 30 tablet 1   rosuvastatin (CRESTOR) 40 MG tablet Take 1 tablet (40 mg total) by mouth at bedtime. 90 tablet 3   senna (SENOKOT) 8.6 MG tablet Take 2 tablets by mouth 2 (two) times daily.     traZODone (DESYREL) 100 MG tablet TAKE 1 TABLET BY MOUTH EVERYDAY AT BEDTIME 90 tablet 2   No current facility-administered medications for this visit.   Facility-Administered Medications Ordered in Other Visits  Medication Dose Route Frequency Provider Last Rate Last Admin   CARBOplatin (PARAPLATIN) 330 mg in sodium chloride 0.9 % 100 mL chemo infusion  330 mg Intravenous Once Bertis Ruddy, Jaydin Boniface, MD       cetirizine (QUZYTTIR) injection 10 mg  10 mg Intravenous Once Bertis Ruddy, Leimomi Zervas, MD       dexamethasone (DECADRON) 10 mg in sodium chloride 0.9 % 50 mL IVPB  10 mg Intravenous Once Bertis Ruddy, Vernona Peake, MD       famotidine (PEPCID) IVPB 20 mg in NS 100 mL IVPB  20 mg Intravenous Once Bertis Ruddy, Osiris Odriscoll, MD       fosaprepitant (EMEND) 150 mg in sodium chloride 0.9 % 145 mL IVPB  150 mg Intravenous Once Bertis Ruddy, Zyon Grout, MD       heparin lock flush 100 unit/mL  500 Units Intracatheter Once PRN Bertis Ruddy, Conny Situ, MD       PACLitaxel (TAXOL) 174 mg in sodium chloride 0.9 % 250 mL chemo infusion (> 80mg /m2)  105 mg/m2 (Treatment Plan Recorded) Intravenous Once Bertis Ruddy, Yunique Dearcos, MD       palonosetron (ALOXI) injection 0.25 mg  0.25 mg  Intravenous Once Blinda Turek, MD       sodium chloride flush (NS) 0.9 % injection 10 mL  10 mL Intracatheter PRN Artis Delay, MD        SUMMARY OF ONCOLOGIC HISTORY: Oncology History Overview Note  High grade serous   Ovarian cancer (HCC)  03/17/2022 Imaging   CT Abdomen/Pelvis: IMPRESSION: 1. Heterogeneous right adnexal mass measuring 3.8 x 3.0 cm with associated soft tissue thickening along the peritoneal reflections and greater omentum. Findings are highly suspicious for ovarian neoplasm with peritoneal carcinomatosis. 2. No evidence of bowel obstruction. 3. Coronary artery calcifications.   03/18/2022 Tumor Marker   CA125: 163   04/08/2022 Surgery   Diagnostic laparoscopy with peritoneal and omental biopsies  Findings: On entry to abdomen, omentum and large bowel broadly and densely adherent to the anterior abdominal wall in the midline from  the level of the falciform ligament superiorly to the bladder inferiorly. Evidence of peritoneal plaques consistent with peritoneal carcinomatosis on the left diaphragm and left anterior abdominal wall and pelvic peritoneum. Right diaphragm and liver limited in visualization but no evidence of disease. Left liver lobe and stomach normal. Right abdomen not visualized due to the midline adhesions. Pelvic mass not visualized due to adhesions. Omentum adherent to the anterior abdominal wall consistent with omental cake. Small bowel and mesentery that is able to be visualized does not appear to have disease. Initial peritoneal biopsy indeterminate for carcinoma on frozen. Additional biopsies obtained.    04/08/2022 Initial Diagnosis   Ovarian cancer (HCC)   04/08/2022 Pathology Results   A. PERITONEUM, BIOPSY:  Microscopic focus of adenocarcinoma.  See comment.   B. PERITONEUM, #2, BIOPSY:  Microscopic focus of adenocarcinoma.   C. OMENTUM, BIOPSY:  Adenocarcinoma.  See comment.   COMMENT:  A. The permanent recuts of the peritoneal biopsy  submitted for frozen  section show a microscopic focus of adenocarcinoma which is not  identified in the frozen section slides.   C.  The omental specimen shows adenocarcinoma and immunohistochemistry  will be performed and reported as an addendum.  The morphologic features  favor an ovarian primary.   ADDENDUM:  Immunohistochemistry shows the carcinoma is positive with PAX8 and p16 with patchy positive staining with WT1 and estrogen receptor.  Progesterone receptor is negative.  P53 shows diffuse strong positivity (mutated pattern).  The morphology and immunophenotype are consistent with high-grade serous carcinoma.    04/08/2022 Pathology Results   CYTOLOGY FINAL MICROSCOPIC DIAGNOSIS:  - Malignant cells consistent with adenocarcinoma  - See comment    04/17/2022 Cancer Staging   Staging form: Ovary, Fallopian Tube, and Primary Peritoneal Carcinoma, AJCC 8th Edition - Clinical: FIGO Stage IIIC (cT3c, cN0, cM0) - Signed by Artis Delay, MD on 04/17/2022 Stage prefix: Initial diagnosis   04/30/2022 -  Chemotherapy   Patient is on Treatment Plan : OVARIAN Carboplatin (AUC 6) + Paclitaxel (175) q21d X 6 Cycles     05/02/2022 Tumor Marker   Patient's tumor was tested for the following markers: CA-125. Results of the tumor marker test revealed 193.   05/26/2022 Procedure   Technically successful right IJ power-injectable port catheter placement. Ready for routine use.     05/30/2022 Tumor Marker   Patient's tumor was tested for the following markers: CA-125. Results of the tumor marker test revealed 93.5.     PHYSICAL EXAMINATION: ECOG PERFORMANCE STATUS: 1 - Symptomatic but completely ambulatory  Vitals:   06/27/22 0817  BP: 128/71  Pulse: 83  Resp: 18  Temp: 98 F (36.7 C)  SpO2: 95%   Filed Weights   06/27/22 0817  Weight: 133 lb 6.4 oz (60.5 kg)    GENERAL:alert, no distress and comfortable NEURO: alert & oriented x 3 with fluent speech, no focal motor/sensory  deficits  LABORATORY DATA:  I have reviewed the data as listed    Component Value Date/Time   NA 139 06/27/2022 0725   NA 139 09/19/2015 1422   K 4.2 06/27/2022 0725   CL 102 06/27/2022 0725   CO2 29 06/27/2022 0725   GLUCOSE 185 (H) 06/27/2022 0725   BUN 21 06/27/2022 0725   BUN 20 09/19/2015 1422   CREATININE 0.52 06/27/2022 0725   CALCIUM 10.1 06/27/2022 0725   PROT 7.0 06/27/2022 0725   PROT 7.0 09/19/2015 1422   ALBUMIN 4.1 06/27/2022 0725   ALBUMIN 4.6 09/19/2015 1422  AST 14 (L) 06/27/2022 0725   ALT 9 06/27/2022 0725   ALKPHOS 102 06/27/2022 0725   BILITOT 0.7 06/27/2022 0725   GFRNONAA >60 06/27/2022 0725   GFRAA >60 04/20/2018 1452    No results found for: "SPEP", "UPEP"  Lab Results  Component Value Date   WBC 2.6 (L) 06/27/2022   NEUTROABS 1.8 06/27/2022   HGB 11.3 (L) 06/27/2022   HCT 35.6 (L) 06/27/2022   MCV 93.2 06/27/2022   PLT 216 06/27/2022      Chemistry      Component Value Date/Time   NA 139 06/27/2022 0725   NA 139 09/19/2015 1422   K 4.2 06/27/2022 0725   CL 102 06/27/2022 0725   CO2 29 06/27/2022 0725   BUN 21 06/27/2022 0725   BUN 20 09/19/2015 1422   CREATININE 0.52 06/27/2022 0725      Component Value Date/Time   CALCIUM 10.1 06/27/2022 0725   ALKPHOS 102 06/27/2022 0725   AST 14 (L) 06/27/2022 0725   ALT 9 06/27/2022 0725   BILITOT 0.7 06/27/2022 0725

## 2022-06-27 NOTE — Assessment & Plan Note (Signed)
She developed some mild pancytopenia likely due to cumulative side effects of chemo We discussed risk and benefits of mild dose reduction versus lengthening the interval between treatment on both Ultimately, she is in agreement for mild dose reduction I plan to reduce the dose of carboplatin a little bit

## 2022-06-27 NOTE — Assessment & Plan Note (Signed)
This is due to side effects of treatment So far, she is not symptomatic I recommend mild dose reduction and we will proceed with treatment without delay

## 2022-06-27 NOTE — Patient Instructions (Signed)
[facility]  Discharge Instructions: Thank you for choosing [facility] to provide your oncology and hematology care.   If you have a lab appointment with the Cancer Center, please go directly to the Cancer Center and check in at the registration area.   Wear comfortable clothing and clothing appropriate for easy access to any Portacath or PICC line.   We strive to give you quality time with your provider. You may need to reschedule your appointment if you arrive late (15 or more minutes).  Arriving late affects you and other patients whose appointments are after yours.  Also, if you miss three or more appointments without notifying the office, you may be dismissed from the clinic at the provider's discretion.      For prescription refill requests, have your pharmacy contact our office and allow 72 hours for refills to be completed.    Today you received the following chemotherapy and/or immunotherapy agents: paclitaxel and carboplatin      To help prevent nausea and vomiting after your treatment, we encourage you to take your nausea medication as directed.  BELOW ARE SYMPTOMS THAT SHOULD BE REPORTED IMMEDIATELY: *FEVER GREATER THAN 100.4 F (38 C) OR HIGHER *CHILLS OR SWEATING *NAUSEA AND VOMITING THAT IS NOT CONTROLLED WITH YOUR NAUSEA MEDICATION *UNUSUAL SHORTNESS OF BREATH *UNUSUAL BRUISING OR BLEEDING *URINARY PROBLEMS (pain or burning when urinating, or frequent urination) *BOWEL PROBLEMS (unusual diarrhea, constipation, pain near the anus) TENDERNESS IN MOUTH AND THROAT WITH OR WITHOUT PRESENCE OF ULCERS (sore throat, sores in mouth, or a toothache) UNUSUAL RASH, SWELLING OR PAIN  UNUSUAL VAGINAL DISCHARGE OR ITCHING   Items with * indicate a potential emergency and should be followed up as soon as possible or go to the Emergency Department if any problems should occur.  Please show the CHEMOTHERAPY ALERT CARD or IMMUNOTHERAPY  ALERT CARD at check-in to the Emergency Department and triage nurse.  Should you have questions after your visit or need to cancel or reschedule your appointment, please contact [facility]  Dept: [phone]  and follow the prompts.  Office hours are 8:00 a.m. to 4:30 p.m. Monday - Friday. Please note that voicemails left after 4:00 p.m. may not be returned until the following business day.  We are closed weekends and major holidays. You have access to a nurse at all times for urgent questions. Please call the main number to the clinic Dept: [phone] and follow the prompts.   For any non-urgent questions, you may also contact your provider using MyChart. We now offer e-Visits for anyone 18 and older to request care online for non-urgent symptoms. For details visit [email or web address].   Also download the MyChart app! Go to the app store, search "MyChart", open the app, select [facility], and log in with your MyChart username and password.   

## 2022-06-27 NOTE — Assessment & Plan Note (Signed)
She had significant chronic pain syndrome related to problems with her back Her pain is poorly controlled at baseline So far, she denies worsening pain or peripheral neuropathy on treatment

## 2022-06-27 NOTE — Progress Notes (Signed)
[facility] Spiritual Care Note  Followed up with Ms Schlottman in infusion, meeting her son while he was visiting. Ms Demus reports that she is doing well and has no needs or concerns at this time, other than processing the sheer number of people who are coping with cancer and the fact that she, too, has developed cancer with no family history.  Provided empathic listening and emotional support.  Ms Neary welcomes check-ins at future treatments and has chaplain's direct number in case needs arise in the meantime.   [address] [place], [place], [facility] Pager [phone] Voicemail [phone]

## 2022-06-30 ENCOUNTER — Telehealth: Payer: Self-pay

## 2022-06-30 NOTE — Telephone Encounter (Signed)
Called patient to discuss follow up this week- patient reports she is not having PET Scan and wishes to cancel it at this time. Patient states that her (chemo) doctor recommends to hold off on this. PET scan cancelled per patient request. Patient would like to keep OV this week to discuss further with Dr. Jacques Navy.

## 2022-07-01 ENCOUNTER — Emergency Department (HOSPITAL_BASED_OUTPATIENT_CLINIC_OR_DEPARTMENT_OTHER)
Admission: EM | Admit: 2022-07-01 | Discharge: 2022-07-01 | Disposition: A | Discharge status: Home / Self Care | Payer: Medicare HMO | Attending: Emergency Medicine | Admitting: Emergency Medicine

## 2022-07-01 ENCOUNTER — Other Ambulatory Visit: Payer: Self-pay

## 2022-07-01 ENCOUNTER — Encounter (HOSPITAL_BASED_OUTPATIENT_CLINIC_OR_DEPARTMENT_OTHER): Payer: Self-pay

## 2022-07-01 ENCOUNTER — Telehealth: Payer: Self-pay

## 2022-07-01 ENCOUNTER — Telehealth: Payer: Self-pay | Admitting: Hematology and Oncology

## 2022-07-01 ENCOUNTER — Other Ambulatory Visit: Payer: Self-pay | Admitting: Hematology and Oncology

## 2022-07-01 ENCOUNTER — Emergency Department (HOSPITAL_BASED_OUTPATIENT_CLINIC_OR_DEPARTMENT_OTHER): Payer: Medicare HMO

## 2022-07-01 DIAGNOSIS — C569 Malignant neoplasm of unspecified ovary: Secondary | ICD-10-CM

## 2022-07-01 DIAGNOSIS — N12 Tubulo-interstitial nephritis, not specified as acute or chronic: Secondary | ICD-10-CM | POA: Insufficient documentation

## 2022-07-01 DIAGNOSIS — Z7982 Long term (current) use of aspirin: Secondary | ICD-10-CM | POA: Diagnosis not present

## 2022-07-01 DIAGNOSIS — Z8673 Personal history of transient ischemic attack (TIA), and cerebral infarction without residual deficits: Secondary | ICD-10-CM | POA: Diagnosis not present

## 2022-07-01 DIAGNOSIS — I2510 Atherosclerotic heart disease of native coronary artery without angina pectoris: Secondary | ICD-10-CM | POA: Insufficient documentation

## 2022-07-01 DIAGNOSIS — R109 Unspecified abdominal pain: Secondary | ICD-10-CM | POA: Diagnosis not present

## 2022-07-01 DIAGNOSIS — R1030 Lower abdominal pain, unspecified: Secondary | ICD-10-CM | POA: Diagnosis not present

## 2022-07-01 DIAGNOSIS — C561 Malignant neoplasm of right ovary: Secondary | ICD-10-CM | POA: Diagnosis not present

## 2022-07-01 LAB — CBC
HCT: 35.9 % — ABNORMAL LOW (ref 36.0–46.0)
Hemoglobin: 11.2 g/dL — ABNORMAL LOW (ref 12.0–15.0)
MCH: 29.9 pg (ref 26.0–34.0)
MCHC: 31.2 g/dL (ref 30.0–36.0)
MCV: 95.7 fL (ref 80.0–100.0)
Platelets: 203 10*3/uL (ref 150–400)
RBC: 3.75 MIL/uL — ABNORMAL LOW (ref 3.87–5.11)
RDW: 15.5 % (ref 11.5–15.5)
WBC: 4.2 10*3/uL (ref 4.0–10.5)
nRBC: 0 % (ref 0.0–0.2)

## 2022-07-01 LAB — COMPREHENSIVE METABOLIC PANEL
ALT: 9 U/L (ref 0–44)
AST: 14 U/L — ABNORMAL LOW (ref 15–41)
Albumin: 3.9 g/dL (ref 3.5–5.0)
Alkaline Phosphatase: 76 U/L (ref 38–126)
Anion gap: 6 (ref 5–15)
BUN: 24 mg/dL — ABNORMAL HIGH (ref 8–23)
CO2: 32 mmol/L (ref 22–32)
Calcium: 9.8 mg/dL (ref 8.9–10.3)
Chloride: 100 mmol/L (ref 98–111)
Creatinine, Ser: 0.58 mg/dL (ref 0.44–1.00)
GFR, Estimated: >60 mL/min (ref >60)
Glucose, Bld: 112 mg/dL — ABNORMAL HIGH (ref 70–99)
Potassium: 4.2 mmol/L (ref 3.5–5.1)
Sodium: 138 mmol/L (ref 135–145)
Total Bilirubin: 0.4 mg/dL (ref 0.3–1.2)
Total Protein: 6.8 g/dL (ref 6.5–8.1)

## 2022-07-01 LAB — URINALYSIS, ROUTINE W REFLEX MICROSCOPIC
Bacteria, UA: NONE SEEN
Bilirubin Urine: NEGATIVE
Glucose, UA: NEGATIVE mg/dL
Nitrite: POSITIVE — AB
Protein, ur: 30 mg/dL — AB
Specific Gravity, Urine: 1.034 — ABNORMAL HIGH (ref 1.005–1.030)
WBC, UA: >50 WBC/hpf (ref 0–5)
pH: 5.5 (ref 5.0–8.0)

## 2022-07-01 LAB — LIPASE, BLOOD: Lipase: 10 U/L — ABNORMAL LOW (ref 11–51)

## 2022-07-01 MED ORDER — IOHEXOL 300 MG/ML  SOLN
100.0000 mL | Freq: Once | INTRAMUSCULAR | Status: AC | PRN
  Administered 2022-07-01: 80 mL via INTRAVENOUS

## 2022-07-01 MED ORDER — SODIUM CHLORIDE 0.9 % IV SOLN
1.0000 g | Freq: Once | INTRAVENOUS | Status: AC
  Administered 2022-07-01: 1 g via INTRAVENOUS
  Filled 2022-07-01: qty 10

## 2022-07-01 MED ORDER — SODIUM CHLORIDE 0.9 % IV SOLN: INTRAVENOUS | Status: DC | PRN

## 2022-07-01 MED ORDER — LACTATED RINGERS IV BOLUS
1000.0000 mL | Freq: Once | INTRAVENOUS | Status: AC
  Administered 2022-07-01: 1000 mL via INTRAVENOUS

## 2022-07-01 MED ORDER — CEFPODOXIME PROXETIL 200 MG PO TABS: 200.0000 mg | ORAL_TABLET | Freq: Two times a day (BID) | ORAL | 0 refills | Status: AC

## 2022-07-01 MED ORDER — HEPARIN SOD (PORK) LOCK FLUSH 100 UNIT/ML IV SOLN
500.0000 [IU] | Freq: Once | INTRAVENOUS | Status: AC
  Administered 2022-07-01: 500 [IU]
  Filled 2022-07-01: qty 5

## 2022-07-01 MED ORDER — FENTANYL CITRATE PF 50 MCG/ML IJ SOSY
50.0000 ug | PREFILLED_SYRINGE | Freq: Once | INTRAMUSCULAR | Status: AC
  Administered 2022-07-01: 50 ug via INTRAVENOUS
  Filled 2022-07-01: qty 1

## 2022-07-01 NOTE — Telephone Encounter (Signed)
Spoke with patient confirming upcoming appointment  

## 2022-07-01 NOTE — Discharge Instructions (Addendum)
Please follow-up with your primary care provider.  Will treat you for pyelonephritis with a 10-day course of antibiotics.  Return for any severe worsening of symptoms, inability to tolerate oral intake.

## 2022-07-01 NOTE — Telephone Encounter (Signed)
Called and left a message asking her to call the office back. PA obtained for CT.

## 2022-07-01 NOTE — Telephone Encounter (Signed)
Called her back again. On Saturday she started having abdominal/ back pain that is constant. She is able to eat and drink with no problems. Denies constipation. She is taking percocet 1 tablet every 5 hours. She is asking what Dr. Bertis Ruddy recommends? Should she go to the ER?

## 2022-07-01 NOTE — Telephone Encounter (Signed)
I ordered STAT CT to be done, see if we can get it done by Friday If she cannot wait, then she needs to go to the ER

## 2022-07-01 NOTE — Telephone Encounter (Signed)
Called her back and given below message. She is not sure what she wants to do. She may go to the ER. Sent message to PA person to work on PA for CT. Lailoni will call the office back for questions.

## 2022-07-01 NOTE — ED Provider Notes (Signed)
[place] EMERGENCY DEPARTMENT AT [facility] Provider Note   CSN: [id number] Arrival date & time: 07/01/22  1703     History  Chief Complaint  Patient presents with   Abdominal Pain    Alexandria Mclaughlin is a 82 y.o. female.   Abdominal Pain    82 year old female with medical history significant for MDD, GERD, nephrolithiasis, HLD, CVA/TIA, CAD, ovarian cancer on chemotherapy who presents to the emerged department with abdominal pain.  The patient states that she has had suprapubic abdominal pain that radiates to her left flank.  This patient symptoms have been present for the past 2 days.  She denies any fevers or chills.  She denies any nausea or vomiting or diarrhea.  She is tolerating oral intake.  No dysuria or increased urinary frequency.  Home Medications Prior to Admission medications   Medication Sig Start Date End Date Taking? Authorizing Provider  cefpodoxime (VANTIN) 200 MG tablet Take 1 tablet (200 mg total) by mouth 2 (two) times daily for 10 days. 07/01/22 07/11/22 Yes Ernie Avena, MD  aspirin-acetaminophen-caffeine (EXCEDRIN MIGRAINE) [phone] MG tablet Take 1 tablet by mouth every 6 (six) hours as needed for headache or migraine.    [provider]  Cholecalciferol (VITAMIN D) 50 MCG (2000 UT) CAPS Take 2,000 Units by mouth daily.    [provider]  clonazePAM (KLONOPIN) 0.5 MG tablet TAKE 1 TABLET BY MOUTH AT BEDTIME AS NEEDED ANXIETY 05/22/22   Raulkar, Drema Pry, MD  cyanocobalamin 1000 MCG tablet 1,000 mcg daily.    [provider]  dexamethasone (DECADRON) 4 MG tablet Take 2 tabs at the night before and 2 tab the morning of chemotherapy, every 3 weeks, by mouth x 6 cycles 04/22/22   Artis Delay, MD  DULoxetine (CYMBALTA) 60 MG capsule Take 60 mg by mouth daily. 02/20/22   [provider]  fentaNYL (DURAGESIC) 25 MCG/HR Place 1 patch onto the skin every 3 (three) days. 06/18/22 06/18/23  Jones Bales, NP  gabapentin  (NEURONTIN) 600 MG tablet TAKE 1 TABLET BY MOUTH FOUR TIMES A DAY 02/13/22   Jones Bales, NP  irbesartan (AVAPRO) 150 MG tablet Take 150 mg by mouth daily.    [provider]  lactulose (CHRONULAC) 10 GM/15ML solution Take 15 mLs (10 g total) by mouth 3 (three) times daily. 04/22/22   Artis Delay, MD  lidocaine-prilocaine (EMLA) cream Apply to affected area once 04/22/22   Artis Delay, MD  magnesium oxide (MAG-OX) 400 (240 Mg) MG tablet TAKE 1 TABLET BY MOUTH EVERY DAY 06/17/22   Artis Delay, MD  Melatonin 10 MG TABS Take 10 mg by mouth at bedtime as needed (sleep).    [provider]  methimazole (TAPAZOLE) 5 MG tablet Take 5 mg by mouth 2 (two) times a week.    [provider]  ondansetron (ZOFRAN) 8 MG tablet Take 1 tablet (8 mg total) by mouth every 8 (eight) hours as needed for nausea or vomiting. Start on the third day after carboplatin. 04/22/22   Artis Delay, MD  oxyCODONE-acetaminophen (PERCOCET) 10-325 MG tablet Take 1 tablet by mouth 5 (five) times daily as needed for pain. 06/18/22 06/18/23  Jones Bales, NP  polyethylene glycol (MIRALAX / GLYCOLAX) 17 g packet Take 17 g by mouth 2 (two) times daily.    [provider]  prochlorperazine (COMPAZINE) 10 MG tablet Take 1 tablet (10 mg total) by mouth every 6 (six) hours as needed for nausea or vomiting. 04/22/22  Artis Delay, MD  rosuvastatin (CRESTOR) 40 MG tablet Take 1 tablet (40 mg total) by mouth at bedtime. 05/05/22   Parke Poisson, MD  senna (SENOKOT) 8.6 MG tablet Take 2 tablets by mouth 2 (two) times daily.    [provider]  traZODone (DESYREL) 100 MG tablet TAKE 1 TABLET BY MOUTH EVERYDAY AT BEDTIME 12/09/21   Jones Bales, NP      Allergies    Patient has no known allergies.    Review of Systems   Review of Systems  Gastrointestinal:  Positive for abdominal pain.  All other systems reviewed and are negative.   Physical Exam Updated Vital Signs BP 108/77 (BP  Location: Right Arm)   Pulse 85   Temp 98.9 F (37.2 C) (Oral)   Resp 20   Ht 5\' 3"  (1.6 m)   Wt 60.5 kg   SpO2 96%   BMI 23.63 kg/m  Physical Exam Vitals and nursing note reviewed.  Constitutional:      General: She is not in acute distress.    Appearance: She is well-developed.  HENT:     Head: Normocephalic and atraumatic.  Eyes:     Conjunctiva/sclera: Conjunctivae normal.  Cardiovascular:     Rate and Rhythm: Normal rate and regular rhythm.     Heart sounds: No murmur heard. Pulmonary:     Effort: Pulmonary effort is normal. No respiratory distress.     Breath sounds: Normal breath sounds.  Chest:     Comments: Chest wall port in place, no erythema Abdominal:     Palpations: Abdomen is soft.     Tenderness: There is abdominal tenderness in the suprapubic area. There is left CVA tenderness. There is no guarding or rebound.  Musculoskeletal:        General: No swelling.     Cervical back: Neck supple.  Skin:    General: Skin is warm and dry.     Capillary Refill: Capillary refill takes less than 2 seconds.  Neurological:     Mental Status: She is alert.  Psychiatric:        Mood and Affect: Mood normal.     ED Results / Procedures / Treatments   Labs (all labs ordered are listed, but only abnormal results are displayed) Labs Reviewed  LIPASE, BLOOD - Abnormal; Notable for the following components:      Result Value   Lipase 10 (*)    All other components within normal limits  COMPREHENSIVE METABOLIC PANEL - Abnormal; Notable for the following components:   Glucose, Bld 112 (*)    BUN 24 (*)    AST 14 (*)    All other components within normal limits  CBC - Abnormal; Notable for the following components:   RBC 3.75 (*)    Hemoglobin 11.2 (*)    HCT 35.9 (*)    All other components within normal limits  URINALYSIS, ROUTINE W REFLEX MICROSCOPIC - Abnormal; Notable for the following components:   Specific Gravity, Urine 1.034 (*)    Hgb urine dipstick  SMALL (*)    Ketones, ur TRACE (*)    Protein, ur 30 (*)    Nitrite POSITIVE (*)    Leukocytes,Ua LARGE (*)    All other components within normal limits    EKG None  Radiology CT ABDOMEN PELVIS W CONTRAST  Result Date: 07/01/2022 CLINICAL DATA:  Abdominal pain. EXAM: CT ABDOMEN AND PELVIS WITH CONTRAST TECHNIQUE: Multidetector CT imaging of the abdomen and pelvis was  performed using the standard protocol following bolus administration of intravenous contrast. RADIATION DOSE REDUCTION: This exam was performed according to the departmental dose-optimization program which includes automated exposure control, adjustment of the mA and/or kV according to patient size and/or use of iterative reconstruction technique. CONTRAST:  80mL OMNIPAQUE IOHEXOL 300 MG/ML  SOLN COMPARISON:  CT abdomen pelvis dated 03/17/2022. FINDINGS: Lower chest: The visualized lung bases are clear. No intra-abdominal free air.  No significant free fluid. Hepatobiliary: The liver is unremarkable. No biliary dilatation. The gallbladder is unremarkable. Pancreas: Unremarkable. No pancreatic ductal dilatation or surrounding inflammatory changes. Spleen: Normal in size without focal abnormality. Adrenals/Urinary Tract: The adrenal glands unremarkable. The kidneys, visualized ureters, and urinary bladder appear unremarkable. Stomach/Bowel: Small hiatal hernia. There is no bowel obstruction or active inflammation. There is a 1 x 2 cm nodule in the pelvis with slight tethering of the adjacent bowel (66/2). This is suspicious for metastatic implant. The appendix is not visualized with certainty. No inflammatory changes identified in the right lower quadrant. Vascular/Lymphatic: Moderate aortoiliac atherosclerotic disease. The IVC is unremarkable. No portal venous gas. There is no adenopathy. Reproductive: Hysterectomy. A 3.6 x 3.0 cm complex right adnexal mass most consistent with ovarian neoplasm. Other: There is omental nodularity and  implants, slightly increased since the prior CT. Musculoskeletal: There is old L1 compression fracture. L4-L5 disc spacer and posterior fusion. No acute osseous pathology. IMPRESSION: 1. No acute intra-abdominal or pelvic pathology. 2. Complex right adnexal mass most consistent with ovarian neoplasm. 3. Omental nodularity and implants, slightly increased since the prior CT. 4. No bowel obstruction. 5.  Aortic Atherosclerosis (ICD10-I70.0). Electronically Signed   By: Elgie Collard M.D.   On: 07/01/2022 19:32    Procedures Procedures    Medications Ordered in ED Medications  cefTRIAXone (ROCEPHIN) 1 g in sodium chloride 0.9 % 100 mL IVPB (has no administration in time range)  fentaNYL (SUBLIMAZE) injection 50 mcg (has no administration in time range)  lactated ringers bolus 1,000 mL (1,000 mLs Intravenous New Bag/Given 07/01/22 1859)  iohexol (OMNIPAQUE) 300 MG/ML solution 100 mL (80 mLs Intravenous Contrast Given 07/01/22 1900)    ED Course/ Medical Decision Making/ A&P Clinical Course as of 07/01/22 1956  Tue Jul 01, 2022  1945 Nitrite(!): POSITIVE [JL]  1945 Leukocytes,Ua(!): LARGE [JL]  1945 WBC, UA: >50 [JL]    Clinical Course User Index [JL] Ernie Avena, MD                             Medical Decision Making Amount and/or Complexity of Data Reviewed Labs: ordered. Decision-making details documented in ED Course. Radiology: ordered.  Risk Prescription drug management.    82 year old female with medical history significant for MDD, GERD, nephrolithiasis, HLD, CVA/TIA, CAD, ovarian cancer on chemotherapy who presents to the emerged department with abdominal pain.  The patient states that she has had suprapubic abdominal pain that radiates to her left flank.  This patient symptoms have been present for the past 2 days.  She denies any fevers or chills.  She denies any nausea or vomiting or diarrhea.  She is tolerating oral intake.  No dysuria or increased urinary  frequency.  On arrival, the patient was vitally stable, afebrile, not tachycardic or tachypneic, saturating well on room air.  Physical exam significant for suprapubic tenderness to palpation, no rebound or guarding, left-sided CVA tenderness.  Differential diagnose includes UTI/pyelonephritis, nephrolithiasis, small bowel obstruction.  Also considered diverticulitis.  IV  access was obtained and the patient was administered an IV fluid bolus in addition to IV fentanyl.  Laboratory evaluation significant for urinalysis with evidence of UTI with positive nitrites, positive leukocytes, greater than 50 WBCs, lipase normal, CBC without a leukocytosis, stable hemoglobin at 11.2, CMP generally unremarkable.  CT abdomen pelvis revealed the following: IMPRESSION:  1. No acute intra-abdominal or pelvic pathology.  2. Complex right adnexal mass most consistent with ovarian neoplasm.  3. Omental nodularity and implants, slightly increased since the  prior CT.  4. No bowel obstruction.  5.  Aortic Atherosclerosis (ICD10-I70.0).    The patient was administered IV Rocephin.  She was overall well-appearing, vitally stable.  Concern for UTI and developing pyelonephritis.  No concern for sepsis at this time.  Will trial a course of outpatient antibiotics to treat for pyelonephritis.  Return precautions provided, stable for discharge.  Final Clinical Impression(s) / ED Diagnoses Final diagnoses:  Pyelonephritis    Rx / DC Orders ED Discharge Orders          Ordered    cefpodoxime (VANTIN) 200 MG tablet  2 times daily        07/01/22 1956              Ernie Avena, MD 07/01/22 1956

## 2022-07-01 NOTE — ED Notes (Signed)
Patient transported to CT 

## 2022-07-01 NOTE — ED Triage Notes (Signed)
Patient here POV from Home.  Endorses Back Pain, ABD Pain, and Pelvic Pain for 2 Days. ABD Pain is Lower ABD and radiates around to Lower Back and pelvic Area.   History of Ovarian Cancer with Current Chemotherapy. No Known fevers. No N/V/D. No Dysuria.   NAD Noted during Triage. A&Ox4. Gcs 15. Ambulatory.

## 2022-07-01 NOTE — ED Notes (Signed)
Reviewed AVS/discharge instruction with patient. Time allotted for and all questions answered. Patient is agreeable for d/c and escorted to ed exit by staff.  

## 2022-07-01 NOTE — Telephone Encounter (Signed)
Returned her call and left a message asking her to call the office back. She has been having abdominal and back for a few days and is asking if she should go to the ER.

## 2022-07-01 NOTE — Telephone Encounter (Signed)
Called patient twice patient not available unable to leave vm

## 2022-07-02 ENCOUNTER — Ambulatory Visit: Payer: Medicare HMO | Admitting: Internal Medicine

## 2022-07-02 ENCOUNTER — Telehealth: Payer: Self-pay

## 2022-07-02 ENCOUNTER — Encounter: Payer: Self-pay | Admitting: Hematology and Oncology

## 2022-07-02 NOTE — Telephone Encounter (Signed)
Since she is on antibiotics and better I do not need to see her sooner than her appt

## 2022-07-02 NOTE — Telephone Encounter (Signed)
Returned her call. She is following up after going to ED. She started antibiotics and the pain is a little better today. She will take cefpodoxime for 10 days.  The ED instructed her to follow up with PCP. She is asking if she should follow up with PCP or Dr. Bertis Ruddy?

## 2022-07-02 NOTE — Telephone Encounter (Signed)
Called and given below message. She verbalized understanding. She will call the office back for worsening symptoms.

## 2022-07-04 ENCOUNTER — Inpatient Hospital Stay: Payer: Medicare HMO

## 2022-07-04 ENCOUNTER — Other Ambulatory Visit: Payer: Self-pay

## 2022-07-04 ENCOUNTER — Telehealth: Payer: Self-pay

## 2022-07-04 DIAGNOSIS — K5909 Other constipation: Secondary | ICD-10-CM | POA: Diagnosis not present

## 2022-07-04 DIAGNOSIS — Z5111 Encounter for antineoplastic chemotherapy: Secondary | ICD-10-CM | POA: Diagnosis not present

## 2022-07-04 DIAGNOSIS — R300 Dysuria: Secondary | ICD-10-CM

## 2022-07-04 DIAGNOSIS — D61818 Other pancytopenia: Secondary | ICD-10-CM | POA: Diagnosis not present

## 2022-07-04 DIAGNOSIS — Z79899 Other long term (current) drug therapy: Secondary | ICD-10-CM | POA: Diagnosis not present

## 2022-07-04 DIAGNOSIS — I700 Atherosclerosis of aorta: Secondary | ICD-10-CM | POA: Diagnosis not present

## 2022-07-04 DIAGNOSIS — K449 Diaphragmatic hernia without obstruction or gangrene: Secondary | ICD-10-CM | POA: Diagnosis not present

## 2022-07-04 DIAGNOSIS — C541 Malignant neoplasm of endometrium: Secondary | ICD-10-CM | POA: Diagnosis not present

## 2022-07-04 DIAGNOSIS — M5416 Radiculopathy, lumbar region: Secondary | ICD-10-CM | POA: Diagnosis not present

## 2022-07-04 DIAGNOSIS — G894 Chronic pain syndrome: Secondary | ICD-10-CM | POA: Diagnosis not present

## 2022-07-04 DIAGNOSIS — I2510 Atherosclerotic heart disease of native coronary artery without angina pectoris: Secondary | ICD-10-CM | POA: Diagnosis not present

## 2022-07-04 LAB — URINALYSIS, COMPLETE (UACMP) WITH MICROSCOPIC
Bilirubin Urine: NEGATIVE
Glucose, UA: NEGATIVE mg/dL
Ketones, ur: NEGATIVE mg/dL
Nitrite: NEGATIVE
Protein, ur: NEGATIVE mg/dL
Specific Gravity, Urine: 1.023 (ref 1.005–1.030)
pH: 5 (ref 5.0–8.0)

## 2022-07-04 NOTE — Telephone Encounter (Signed)
Called and given below message. She verbalized understanding. She is going to call her PCP office to see if she can come in and give UA/urine culture today and then call the office back. She does not think that she needs pain medication adjusted.

## 2022-07-04 NOTE — Telephone Encounter (Signed)
Returned her call. She wants lab appt at [facility], added lab appt at 11 am today per her request. She is aware of appt.

## 2022-07-04 NOTE — Telephone Encounter (Signed)
Returned her call. She is not feeling better since going to ED and starting antibiotics. She thought she would be feeling better by now. She is taking Vantin as prescribed. Denies fever and voiding with no problems. She is able to drink fluids but does not have much of a appetite. She is complaining of left & right flank pain. The left side hurts more that the right.  She is asking what Dr. Bertis Ruddy recommends that she do and if maybe she needs a stronger antibiotic? [facility] closed today.

## 2022-07-04 NOTE — Telephone Encounter (Signed)
I am not seeing anything on her CT unless she is not responding to the antibiotics ER did not send a culture We can get her in for repeat UA and UCx and call in a different antibiotics Since her pain is managed by her pain specialist, she needs to call that office if she needs her pain med increased Let me know what she decide, or she can go back to ER to be evaluated

## 2022-07-05 LAB — URINE CULTURE

## 2022-07-06 LAB — URINE CULTURE: Culture: 10000 — AB

## 2022-07-07 ENCOUNTER — Telehealth: Payer: Self-pay

## 2022-07-07 ENCOUNTER — Other Ambulatory Visit: Payer: Self-pay | Admitting: Hematology and Oncology

## 2022-07-07 ENCOUNTER — Encounter: Payer: Self-pay | Admitting: Genetic Counselor

## 2022-07-07 ENCOUNTER — Ambulatory Visit: Payer: Self-pay | Admitting: Genetic Counselor

## 2022-07-07 ENCOUNTER — Telehealth: Payer: Self-pay | Admitting: Genetic Counselor

## 2022-07-07 DIAGNOSIS — Z1379 Encounter for other screening for genetic and chromosomal anomalies: Secondary | ICD-10-CM | POA: Insufficient documentation

## 2022-07-07 DIAGNOSIS — C569 Malignant neoplasm of unspecified ovary: Secondary | ICD-10-CM

## 2022-07-07 LAB — URINE CULTURE

## 2022-07-07 MED ORDER — AMPICILLIN 500 MG PO CAPS: 500.0000 mg | ORAL_CAPSULE | Freq: Three times a day (TID) | ORAL | 0 refills | Status: DC

## 2022-07-07 NOTE — Progress Notes (Signed)
HPI:   Alexandria Mclaughlin was previously seen in the [facility] due to a personal history of ovarian cancer and concerns regarding a hereditary predisposition to cancer.    Alexandria Mclaughlin recent genetic test results were disclosed to her and her daughter, Alexandria Mclaughlin, by telephone. These results and recommendations are discussed in more detail below.  CANCER HISTORY:  Oncology History Overview Note  High grade serous   Ovarian cancer (HCC)  03/17/2022 Imaging   CT Abdomen/Pelvis: IMPRESSION: 1. Heterogeneous right adnexal mass measuring 3.8 x 3.0 cm with associated soft tissue thickening along the peritoneal reflections and greater omentum. Findings are highly suspicious for ovarian neoplasm with peritoneal carcinomatosis. 2. No evidence of bowel obstruction. 3. Coronary artery calcifications.   03/18/2022 Tumor Marker   CA125: 163   04/08/2022 Surgery   Diagnostic laparoscopy with peritoneal and omental biopsies  Findings: On entry to abdomen, omentum and large bowel broadly and densely adherent to the anterior abdominal wall in the midline from the level of the falciform ligament superiorly to the bladder inferiorly. Evidence of peritoneal plaques consistent with peritoneal carcinomatosis on the left diaphragm and left anterior abdominal wall and pelvic peritoneum. Right diaphragm and liver limited in visualization but no evidence of disease. Left liver lobe and stomach normal. Right abdomen not visualized due to the midline adhesions. Pelvic mass not visualized due to adhesions. Omentum adherent to the anterior abdominal wall consistent with omental cake. Small bowel and mesentery that is able to be visualized does not appear to have disease. Initial peritoneal biopsy indeterminate for carcinoma on frozen. Additional biopsies obtained.    04/08/2022 Initial Diagnosis   Ovarian cancer (HCC)   04/08/2022 Pathology Results   A. PERITONEUM, BIOPSY:  Microscopic focus of adenocarcinoma.   See comment.   B. PERITONEUM, #2, BIOPSY:  Microscopic focus of adenocarcinoma.   C. OMENTUM, BIOPSY:  Adenocarcinoma.  See comment.   COMMENT:  A. The permanent recuts of the peritoneal biopsy submitted for frozen  section show a microscopic focus of adenocarcinoma which is not  identified in the frozen section slides.   C.  The omental specimen shows adenocarcinoma and immunohistochemistry  will be performed and reported as an addendum.  The morphologic features  favor an ovarian primary.   ADDENDUM:  Immunohistochemistry shows the carcinoma is positive with PAX8 and p16 with patchy positive staining with WT1 and estrogen receptor.  Progesterone receptor is negative.  P53 shows diffuse strong positivity (mutated pattern).  The morphology and immunophenotype are consistent with high-grade serous carcinoma.    04/08/2022 Pathology Results   CYTOLOGY FINAL MICROSCOPIC DIAGNOSIS:  - Malignant cells consistent with adenocarcinoma  - See comment    04/17/2022 Cancer Staging   Staging form: Ovary, Fallopian Tube, and Primary Peritoneal Carcinoma, AJCC 8th Edition - Clinical: FIGO Stage IIIC (cT3c, cN0, cM0) - Signed by Artis Delay, MD on 04/17/2022 Stage prefix: Initial diagnosis   04/30/2022 -  Chemotherapy   Patient is on Treatment Plan : OVARIAN Carboplatin (AUC 6) + Paclitaxel (175) q21d X 6 Cycles     05/02/2022 Tumor Marker   Patient's tumor was tested for the following markers: CA-125. Results of the tumor marker test revealed 193.   05/26/2022 Procedure   Technically successful right IJ power-injectable port catheter placement. Ready for routine use.     05/30/2022 Tumor Marker   Patient's tumor was tested for the following markers: CA-125. Results of the tumor marker test revealed 93.5.   07/04/2022 Genetic Testing  Negative [facility] Multi-Cancer +RNA Panel.  VUS in ALK at c.2239G>A (p.Gly747Arg).  Report date is 07/04/2022.   The Multi-Cancer + RNA Panel offered by  [facility] includes sequencing and/or deletion/duplication analysis of the following 70 genes:  AIP*, ALK, APC*, ATM*, AXIN2*, BAP1*, BARD1*, BLM*, BMPR1A*, BRCA1*, BRCA2*, BRIP1*, CDC73*, CDH1*, CDK4, CDKN1B*, CDKN2A, CHEK2*, CTNNA1*, DICER1*, EPCAM (del/dup only), EGFR, FH*, FLCN*, GREM1 (promoter dup only), HOXB13, KIT, LZTR1, MAX*, MBD4, MEN1*, MET, MITF, MLH1*, MSH2*, MSH3*, MSH6*, MUTYH*, NF1*, NF2*, NTHL1*, PALB2*, PDGFRA, PMS2*, POLD1*, POLE*, POT1*, PRKAR1A*, PTCH1*, PTEN*, RAD51C*, RAD51D*, RB1*, RET, SDHA* (sequencing only), SDHAF2*, SDHB*, SDHC*, SDHD*, SMAD4*, SMARCA4*, SMARCB1*, SMARCE1*, STK11*, SUFU*, TMEM127*, TP53*, TSC1*, TSC2*, VHL*. RNA analysis is performed for * genes.        FAMILY HISTORY:  We obtained a 4-generation family history.  She did not report any known family history of cancer.           Alexandria Mclaughlin is unaware of previous family history of genetic testing for hereditary cancer risks. There is no reported Ashkenazi Jewish ancestry. There is no known consanguinity.  GENETIC TEST RESULTS:  The [facility] Multi-Cancer +RNA Panel found no pathogenic mutations.   The Multi-Cancer + RNA Panel offered by [facility] includes sequencing and/or deletion/duplication analysis of the following 70 genes:  AIP*, ALK, APC*, ATM*, AXIN2*, BAP1*, BARD1*, BLM*, BMPR1A*, BRCA1*, BRCA2*, BRIP1*, CDC73*, CDH1*, CDK4, CDKN1B*, CDKN2A, CHEK2*, CTNNA1*, DICER1*, EPCAM (del/dup only), EGFR, FH*, FLCN*, GREM1 (promoter dup only), HOXB13, KIT, LZTR1, MAX*, MBD4, MEN1*, MET, MITF, MLH1*, MSH2*, MSH3*, MSH6*, MUTYH*, NF1*, NF2*, NTHL1*, PALB2*, PDGFRA, PMS2*, POLD1*, POLE*, POT1*, PRKAR1A*, PTCH1*, PTEN*, RAD51C*, RAD51D*, RB1*, RET, SDHA* (sequencing only), SDHAF2*, SDHB*, SDHC*, SDHD*, SMAD4*, SMARCA4*, SMARCB1*, SMARCE1*, STK11*, SUFU*, TMEM127*, TP53*, TSC1*, TSC2*, VHL*. RNA analysis is performed for * genes. .   The test report has been scanned into EPIC and is located under the Molecular Pathology  section of the Results Review tab.  A portion of the result report is included below for reference. Genetic testing reported out on Jul 04, 2022.     Genetic testing identified a variant of uncertain significance (VUS) in the ALK gene called c.2239G>A (p.Gly747Arg).  At this time, it is unknown if this variant is associated with an increased risk for cancer or if it is benign, but most uncertain variants are reclassified to benign. It should not be used to make medical management decisions. With time, we suspect the laboratory will determine the significance of this variant, if any. If the laboratory reclassifies this variant, we will attempt to contact Alexandria Mclaughlin to discuss it further.   Even though a pathogenic variant was not identified, possible explanations for the cancer in the family may include: There may be no hereditary risk for cancer in the family. The cancers in Alexandria Mclaughlin and/or her family may be sporadic/familial or due to other genetic and environmental factors. There may be a gene mutation in one of these genes that current testing methods cannot detect but that chance is small. There could be another gene that has not yet been discovered, or that we have not yet tested, that is responsible for the cancer diagnoses in the family.   Therefore, it is important to remain in touch with cancer genetics in the future so that we can continue to offer Alexandria Mclaughlin the most up to date genetic testing.     ADDITIONAL GENETIC TESTING:   Alexandria Mclaughlin genetic testing was fairly extensive.  If there are additional relevant genes identified  to increase cancer risk that can be analyzed in the future, we would be happy to discuss and coordinate this testing at that time.     CANCER SCREENING RECOMMENDATIONS:  Alexandria Mclaughlin test result is considered negative (normal).  This means that we have not identified a hereditary cause for her personal history of ovarian cancer at this time.   An individual's cancer risk  and medical management are not determined by genetic test results alone. Overall cancer risk assessment incorporates additional factors, including personal medical history, family history, and any available genetic information that may result in a personalized plan for cancer prevention and surveillance. Therefore, it is recommended she continue to follow the cancer management and screening guidelines provided by her oncology and primary healthcare provider.  RECOMMENDATIONS FOR FAMILY MEMBERS:   Since she did not inherit a identifiable mutation in a cancer predisposition gene included on this panel, her children could not have inherited a known mutation from her in one of these genes. Individuals in this family might be at some increased risk of developing cancer, over the general population risk, due to the family history of cancer.  Individuals in the family should notify their providers of the family history of cancer. We recommend women in this family have a yearly mammogram beginning at age 36, or 75 years younger than the earliest onset of cancer, an annual clinical breast exam, and perform monthly breast self-exams.  Risk models that take into account family history and hormonal history may be helpful in determining appropriate breast cancer screening options for family members. Female relatives should speak with their providers about prostate cancer screening.  We do not recommend familial testing for the ALK variant of uncertain significance (VUS).  FOLLOW-UP:  Cancer genetics is a rapidly advancing field and it is possible that new genetic tests will be appropriate for her and/or her family members in the future. We encourage Ms. Folds to remain in contact with cancer genetics, so we can update her personal and family histories and let her know of advances in cancer genetics that may benefit this family.   Our contact number was provided.. she knows she is welcome to call us at anytime with  additional questions or concerns.   Maziah Smola M. Rennie Plowman, MS, [facility] Genetic Counselor [email or web address] (P) [phone]

## 2022-07-07 NOTE — Telephone Encounter (Signed)
-----   Message from Artis Delay, MD sent at 07/07/2022  8:51 AM EDT ----- Her repeat Urine culture is still positive I will send in another antibiotics to her pharmacy

## 2022-07-07 NOTE — Telephone Encounter (Signed)
Disclosed negative genetics and VUS in ALK to Alexandria Mclaughlin.  LVM for daughter, [name], with contact info to discuss results, per request of Ms Mise.

## 2022-07-07 NOTE — Telephone Encounter (Signed)
Called and given below message. She verbalized understanding and will start Ampicillin today and stop Vantin Rx. She will call the office back for questions/ concerns.

## 2022-07-10 ENCOUNTER — Ambulatory Visit: Payer: Medicare HMO | Admitting: Hematology and Oncology

## 2022-07-10 ENCOUNTER — Ambulatory Visit: Payer: Medicare HMO

## 2022-07-10 ENCOUNTER — Other Ambulatory Visit: Payer: Medicare HMO

## 2022-07-17 MED FILL — Dexamethasone Sodium Phosphate Inj 100 MG/10ML: INTRAMUSCULAR | Qty: 1 | Status: AC

## 2022-07-17 MED FILL — Fosaprepitant Dimeglumine For IV Infusion 150 MG (Base Eq): INTRAVENOUS | Qty: 5 | Status: AC

## 2022-07-18 ENCOUNTER — Inpatient Hospital Stay: Payer: Medicare HMO

## 2022-07-18 ENCOUNTER — Encounter: Payer: Self-pay | Admitting: Hematology and Oncology

## 2022-07-18 ENCOUNTER — Encounter: Payer: Self-pay | Admitting: General Practice

## 2022-07-18 ENCOUNTER — Inpatient Hospital Stay (HOSPITAL_BASED_OUTPATIENT_CLINIC_OR_DEPARTMENT_OTHER): Payer: Medicare HMO | Admitting: Hematology and Oncology

## 2022-07-18 VITALS — BP 148/73 | HR 85 | Temp 97.8°F | Resp 18 | Ht 63.0 in | Wt 134.4 lb

## 2022-07-18 DIAGNOSIS — I700 Atherosclerosis of aorta: Secondary | ICD-10-CM | POA: Diagnosis not present

## 2022-07-18 DIAGNOSIS — I2510 Atherosclerotic heart disease of native coronary artery without angina pectoris: Secondary | ICD-10-CM | POA: Diagnosis not present

## 2022-07-18 DIAGNOSIS — K5909 Other constipation: Secondary | ICD-10-CM | POA: Diagnosis not present

## 2022-07-18 DIAGNOSIS — Z79899 Other long term (current) drug therapy: Secondary | ICD-10-CM | POA: Diagnosis not present

## 2022-07-18 DIAGNOSIS — C569 Malignant neoplasm of unspecified ovary: Secondary | ICD-10-CM | POA: Diagnosis not present

## 2022-07-18 DIAGNOSIS — Z5111 Encounter for antineoplastic chemotherapy: Secondary | ICD-10-CM | POA: Diagnosis not present

## 2022-07-18 DIAGNOSIS — K449 Diaphragmatic hernia without obstruction or gangrene: Secondary | ICD-10-CM | POA: Diagnosis not present

## 2022-07-18 DIAGNOSIS — G894 Chronic pain syndrome: Secondary | ICD-10-CM | POA: Diagnosis not present

## 2022-07-18 DIAGNOSIS — M5416 Radiculopathy, lumbar region: Secondary | ICD-10-CM

## 2022-07-18 DIAGNOSIS — C541 Malignant neoplasm of endometrium: Secondary | ICD-10-CM | POA: Diagnosis not present

## 2022-07-18 DIAGNOSIS — D61818 Other pancytopenia: Secondary | ICD-10-CM

## 2022-07-18 LAB — CMP (CANCER CENTER ONLY)
ALT: 11 U/L (ref 0–44)
AST: 15 U/L (ref 15–41)
Albumin: 4.1 g/dL (ref 3.5–5.0)
Alkaline Phosphatase: 91 U/L (ref 38–126)
Anion gap: 9 (ref 5–15)
BUN: 20 mg/dL (ref 8–23)
CO2: 26 mmol/L (ref 22–32)
Calcium: 10 mg/dL (ref 8.9–10.3)
Chloride: 105 mmol/L (ref 98–111)
Creatinine: 0.49 mg/dL (ref 0.44–1.00)
GFR, Estimated: >60 mL/min (ref >60)
Glucose, Bld: 131 mg/dL — ABNORMAL HIGH (ref 70–99)
Potassium: 4 mmol/L (ref 3.5–5.1)
Sodium: 140 mmol/L (ref 135–145)
Total Bilirubin: 0.5 mg/dL (ref 0.3–1.2)
Total Protein: 6.9 g/dL (ref 6.5–8.1)

## 2022-07-18 LAB — CBC WITH DIFFERENTIAL (CANCER CENTER ONLY)
Abs Immature Granulocytes: 0.03 10*3/uL (ref 0.00–0.07)
Basophils Absolute: 0 10*3/uL (ref 0.0–0.1)
Basophils Relative: 0 %
Eosinophils Absolute: 0 10*3/uL (ref 0.0–0.5)
Eosinophils Relative: 0 %
HCT: 36 % (ref 36.0–46.0)
Hemoglobin: 11.8 g/dL — ABNORMAL LOW (ref 12.0–15.0)
Immature Granulocytes: 0 %
Lymphocytes Relative: 20 %
Lymphs Abs: 1.4 10*3/uL (ref 0.7–4.0)
MCH: 30.6 pg (ref 26.0–34.0)
MCHC: 32.8 g/dL (ref 30.0–36.0)
MCV: 93.5 fL (ref 80.0–100.0)
Monocytes Absolute: 0.3 10*3/uL (ref 0.1–1.0)
Monocytes Relative: 5 %
Neutro Abs: 5.3 10*3/uL (ref 1.7–7.7)
Neutrophils Relative %: 75 %
Platelet Count: 187 10*3/uL (ref 150–400)
RBC: 3.85 MIL/uL — ABNORMAL LOW (ref 3.87–5.11)
RDW: 15.5 % (ref 11.5–15.5)
WBC Count: 7.1 10*3/uL (ref 4.0–10.5)
nRBC: 0 % (ref 0.0–0.2)

## 2022-07-18 MED ORDER — SODIUM CHLORIDE 0.9 % IV SOLN
10.0000 mg | Freq: Once | INTRAVENOUS | Status: AC
  Administered 2022-07-18: 10 mg via INTRAVENOUS
  Filled 2022-07-18: qty 10

## 2022-07-18 MED ORDER — SODIUM CHLORIDE 0.9% FLUSH
10.0000 mL | Freq: Once | INTRAVENOUS | Status: AC
  Administered 2022-07-18: 10 mL

## 2022-07-18 MED ORDER — SODIUM CHLORIDE 0.9% FLUSH
10.0000 mL | INTRAVENOUS | Status: DC | PRN
  Administered 2022-07-18: 10 mL

## 2022-07-18 MED ORDER — SODIUM CHLORIDE 0.9 % IV SOLN
332.0000 mg | Freq: Once | INTRAVENOUS | Status: AC
  Administered 2022-07-18: 330 mg via INTRAVENOUS
  Filled 2022-07-18: qty 33

## 2022-07-18 MED ORDER — SODIUM CHLORIDE 0.9 % IV SOLN
150.0000 mg | Freq: Once | INTRAVENOUS | Status: AC
  Administered 2022-07-18: 150 mg via INTRAVENOUS
  Filled 2022-07-18: qty 150

## 2022-07-18 MED ORDER — SODIUM CHLORIDE 0.9 % IV SOLN
87.5000 mg/m2 | Freq: Once | INTRAVENOUS | Status: AC
  Administered 2022-07-18: 144 mg via INTRAVENOUS
  Filled 2022-07-18: qty 24

## 2022-07-18 MED ORDER — HEPARIN SOD (PORK) LOCK FLUSH 100 UNIT/ML IV SOLN
500.0000 [IU] | Freq: Once | INTRAVENOUS | Status: AC | PRN
  Administered 2022-07-18: 500 [IU]

## 2022-07-18 MED ORDER — PALONOSETRON HCL INJECTION 0.25 MG/5ML
0.2500 mg | Freq: Once | INTRAVENOUS | Status: AC
  Administered 2022-07-18: 0.25 mg via INTRAVENOUS
  Filled 2022-07-18: qty 5

## 2022-07-18 MED ORDER — SODIUM CHLORIDE 0.9 % IV SOLN: Freq: Once | INTRAVENOUS | Status: AC

## 2022-07-18 MED ORDER — CETIRIZINE HCL 10 MG/ML IV SOLN
10.0000 mg | Freq: Once | INTRAVENOUS | Status: AC
  Administered 2022-07-18: 10 mg via INTRAVENOUS
  Filled 2022-07-18: qty 1

## 2022-07-18 MED ORDER — FAMOTIDINE IN NACL 20-0.9 MG/50ML-% IV SOLN
20.0000 mg | Freq: Once | INTRAVENOUS | Status: AC
  Administered 2022-07-18: 20 mg via INTRAVENOUS
  Filled 2022-07-18: qty 50

## 2022-07-18 NOTE — Progress Notes (Signed)
[facility] OFFICE PROGRESS NOTE  Patient Care Team: Georgianne Fick, MD as PCP - General (Internal Medicine) Parke Poisson, MD as PCP - Cardiology (Cardiology) Drema Dallas, DO as Consulting Physician (Neurology)  ASSESSMENT & PLAN:  Ovarian cancer [facility]) She continues to tolerate treatment poorly with recurrent infection Ultimately, I recommend we reduce the dose of her chemotherapy down to reduce her risk of adverse effects I plan to repeat imaging study in 2 weeks for further follow-up We discussed the risk and benefits of reducing doses of chemotherapy  Pancytopenia, acquired (HCC) Her blood count is better with recent dose adjustment She will continue on slightly reduced dose of chemo  Lumbar radiculopathy She had significant chronic pain syndrome related to problems with her back Her pain is poorly controlled at baseline So far, she denies worsening pain or peripheral neuropathy on treatment  Orders Placed This Encounter  Procedures   CT CHEST ABDOMEN PELVIS W CONTRAST    Standing Status:   Future    Standing Expiration Date:   07/18/2023    Order Specific Question:   If indicated for the ordered procedure, I authorize the administration of contrast media per Radiology protocol    Answer:   Yes    Order Specific Question:   Does the patient have a contrast media/X-Avallone dye allergy?    Answer:   No    Order Specific Question:   Preferred imaging location?    Answer:   [facility]    Order Specific Question:   If indicated for the ordered procedure, I authorize the administration of oral contrast media per Radiology protocol    Answer:   Yes    All questions were answered. The patient knows to call the clinic with any problems, questions or concerns. The total time spent in the appointment was 40 minutes encounter with patients including review of chart and various tests results, discussions about plan of care and coordination of care plan    Artis Delay, MD 07/18/2022 10:55 AM  INTERVAL HISTORY: Please see below for problem oriented charting. she returns for treatment follow-up with her son She had recent infection requiring 2 courses of antibiotics Prior to that, she developed COVID infection She denies worsening peripheral neuropathy Her chronic pain is stable She denies worsening constipation We discussed the risk and benefits of reducing the dose of her treatment and the timing of her imaging studies  REVIEW OF SYSTEMS:   Constitutional: Denies fevers, chills or abnormal weight loss Eyes: Denies blurriness of vision Ears, nose, mouth, throat, and face: Denies mucositis or sore throat Respiratory: Denies cough, dyspnea or wheezes Cardiovascular: Denies palpitation, chest discomfort or lower extremity swelling Gastrointestinal:  Denies nausea, heartburn or change in bowel habits Skin: Denies abnormal skin rashes Lymphatics: Denies new lymphadenopathy or easy bruising Neurological:Denies numbness, tingling or new weaknesses Behavioral/Psych: Mood is stable, no new changes  All other systems were reviewed with the patient and are negative.  I have reviewed the past medical history, past surgical history, social history and family history with the patient and they are unchanged from previous note.  ALLERGIES:  has No Known Allergies.  MEDICATIONS:  Current Outpatient Medications  Medication Sig Dispense Refill   ampicillin (PRINCIPEN) 500 MG capsule Take 1 capsule (500 mg total) by mouth 3 (three) times daily. 30 capsule 0   aspirin-acetaminophen-caffeine (EXCEDRIN MIGRAINE) 250-250-65 MG tablet Take 1 tablet by mouth every 6 (six) hours as needed for headache or migraine.  Cholecalciferol (VITAMIN D) 50 MCG (2000 UT) CAPS Take 2,000 Units by mouth daily.     clonazePAM (KLONOPIN) 0.5 MG tablet TAKE 1 TABLET BY MOUTH AT BEDTIME AS NEEDED ANXIETY 30 tablet 2   cyanocobalamin 1000 MCG tablet 1,000 mcg daily.      dexamethasone (DECADRON) 4 MG tablet Take 2 tabs at the night before and 2 tab the morning of chemotherapy, every 3 weeks, by mouth x 6 cycles 24 tablet 6   DULoxetine (CYMBALTA) 60 MG capsule Take 60 mg by mouth daily.     fentaNYL (DURAGESIC) 25 MCG/HR Place 1 patch onto the skin every 3 (three) days. 10 patch 0   gabapentin (NEURONTIN) 600 MG tablet TAKE 1 TABLET BY MOUTH FOUR TIMES A DAY 360 tablet 1   irbesartan (AVAPRO) 150 MG tablet Take 150 mg by mouth daily.     lactulose (CHRONULAC) 10 GM/15ML solution Take 15 mLs (10 g total) by mouth 3 (three) times daily. 473 mL 1   lidocaine-prilocaine (EMLA) cream Apply to affected area once 30 g 3   magnesium oxide (MAG-OX) 400 (240 Mg) MG tablet TAKE 1 TABLET BY MOUTH EVERY DAY 30 tablet 1   Melatonin 10 MG TABS Take 10 mg by mouth at bedtime as needed (sleep).     methimazole (TAPAZOLE) 5 MG tablet Take 5 mg by mouth 2 (two) times a week.     ondansetron (ZOFRAN) 8 MG tablet Take 1 tablet (8 mg total) by mouth every 8 (eight) hours as needed for nausea or vomiting. Start on the third day after carboplatin. 30 tablet 1   oxyCODONE-acetaminophen (PERCOCET) 10-325 MG tablet Take 1 tablet by mouth 5 (five) times daily as needed for pain. 130 tablet 0   polyethylene glycol (MIRALAX / GLYCOLAX) 17 g packet Take 17 g by mouth 2 (two) times daily.     prochlorperazine (COMPAZINE) 10 MG tablet Take 1 tablet (10 mg total) by mouth every 6 (six) hours as needed for nausea or vomiting. 30 tablet 1   rosuvastatin (CRESTOR) 40 MG tablet Take 1 tablet (40 mg total) by mouth at bedtime. 90 tablet 3   senna (SENOKOT) 8.6 MG tablet Take 2 tablets by mouth 2 (two) times daily.     traZODone (DESYREL) 100 MG tablet TAKE 1 TABLET BY MOUTH EVERYDAY AT BEDTIME 90 tablet 2   No current facility-administered medications for this visit.   Facility-Administered Medications Ordered in Other Visits  Medication Dose Route Frequency Provider Last Rate Last Admin    CARBOplatin (PARAPLATIN) 330 mg in sodium chloride 0.9 % 100 mL chemo infusion  330 mg Intravenous Once Bertis Ruddy, Gracious Renken, MD       heparin lock flush 100 unit/mL  500 Units Intracatheter Once PRN Bertis Ruddy, Todrick Siedschlag, MD       PACLitaxel (TAXOL) 144 mg in sodium chloride 0.9 % 250 mL chemo infusion (> 80mg /m2)  87.5 mg/m2 (Treatment Plan Recorded) Intravenous Once Aylin Rhoads, MD       sodium chloride flush (NS) 0.9 % injection 10 mL  10 mL Intracatheter PRN Artis Delay, MD        SUMMARY OF ONCOLOGIC HISTORY: Oncology History Overview Note  High grade serous   Ovarian cancer (HCC)  03/17/2022 Imaging   CT Abdomen/Pelvis: IMPRESSION: 1. Heterogeneous right adnexal mass measuring 3.8 x 3.0 cm with associated soft tissue thickening along the peritoneal reflections and greater omentum. Findings are highly suspicious for ovarian neoplasm with peritoneal carcinomatosis. 2. No evidence of bowel obstruction. 3.  Coronary artery calcifications.   03/18/2022 Tumor Marker   CA125: 163   04/08/2022 Surgery   Diagnostic laparoscopy with peritoneal and omental biopsies  Findings: On entry to abdomen, omentum and large bowel broadly and densely adherent to the anterior abdominal wall in the midline from the level of the falciform ligament superiorly to the bladder inferiorly. Evidence of peritoneal plaques consistent with peritoneal carcinomatosis on the left diaphragm and left anterior abdominal wall and pelvic peritoneum. Right diaphragm and liver limited in visualization but no evidence of disease. Left liver lobe and stomach normal. Right abdomen not visualized due to the midline adhesions. Pelvic mass not visualized due to adhesions. Omentum adherent to the anterior abdominal wall consistent with omental cake. Small bowel and mesentery that is able to be visualized does not appear to have disease. Initial peritoneal biopsy indeterminate for carcinoma on frozen. Additional biopsies obtained.    04/08/2022 Initial  Diagnosis   Ovarian cancer (HCC)   04/08/2022 Pathology Results   A. PERITONEUM, BIOPSY:  Microscopic focus of adenocarcinoma.  See comment.   B. PERITONEUM, #2, BIOPSY:  Microscopic focus of adenocarcinoma.   C. OMENTUM, BIOPSY:  Adenocarcinoma.  See comment.   COMMENT:  A. The permanent recuts of the peritoneal biopsy submitted for frozen  section show a microscopic focus of adenocarcinoma which is not  identified in the frozen section slides.   C.  The omental specimen shows adenocarcinoma and immunohistochemistry  will be performed and reported as an addendum.  The morphologic features  favor an ovarian primary.   ADDENDUM:  Immunohistochemistry shows the carcinoma is positive with PAX8 and p16 with patchy positive staining with WT1 and estrogen receptor.  Progesterone receptor is negative.  P53 shows diffuse strong positivity (mutated pattern).  The morphology and immunophenotype are consistent with high-grade serous carcinoma.    04/08/2022 Pathology Results   CYTOLOGY FINAL MICROSCOPIC DIAGNOSIS:  - Malignant cells consistent with adenocarcinoma  - See comment    04/17/2022 Cancer Staging   Staging form: Ovary, Fallopian Tube, and Primary Peritoneal Carcinoma, AJCC 8th Edition - Clinical: FIGO Stage IIIC (cT3c, cN0, cM0) - Signed by Artis Delay, MD on 04/17/2022 Stage prefix: Initial diagnosis   04/30/2022 -  Chemotherapy   Patient is on Treatment Plan : OVARIAN Carboplatin (AUC 6) + Paclitaxel (175) q21d X 6 Cycles     05/02/2022 Tumor Marker   Patient's tumor was tested for the following markers: CA-125. Results of the tumor marker test revealed 193.   05/26/2022 Procedure   Technically successful right IJ power-injectable port catheter placement. Ready for routine use.     05/30/2022 Tumor Marker   Patient's tumor was tested for the following markers: CA-125. Results of the tumor marker test revealed 93.5.   07/04/2022 Genetic Testing   Negative [facility]  Multi-Cancer +RNA Panel.  VUS in ALK at c.2239G>A (p.Gly747Arg).  Report date is 07/04/2022.   The Multi-Cancer + RNA Panel offered by [facility] includes sequencing and/or deletion/duplication analysis of the following 70 genes:  AIP*, ALK, APC*, ATM*, AXIN2*, BAP1*, BARD1*, BLM*, BMPR1A*, BRCA1*, BRCA2*, BRIP1*, CDC73*, CDH1*, CDK4, CDKN1B*, CDKN2A, CHEK2*, CTNNA1*, DICER1*, EPCAM (del/dup only), EGFR, FH*, FLCN*, GREM1 (promoter dup only), HOXB13, KIT, LZTR1, MAX*, MBD4, MEN1*, MET, MITF, MLH1*, MSH2*, MSH3*, MSH6*, MUTYH*, NF1*, NF2*, NTHL1*, PALB2*, PDGFRA, PMS2*, POLD1*, POLE*, POT1*, PRKAR1A*, PTCH1*, PTEN*, RAD51C*, RAD51D*, RB1*, RET, SDHA* (sequencing only), SDHAF2*, SDHB*, SDHC*, SDHD*, SMAD4*, SMARCA4*, SMARCB1*, SMARCE1*, STK11*, SUFU*, TMEM127*, TP53*, TSC1*, TSC2*, VHL*. RNA analysis is performed for * genes.  PHYSICAL EXAMINATION: ECOG PERFORMANCE STATUS: 2 - Symptomatic, <50% confined to bed  Vitals:   07/18/22 0823  BP: (!) 148/73  Pulse: 85  Resp: 18  Temp: 97.8 F (36.6 C)  SpO2: 98%   Filed Weights   07/18/22 0823  Weight: 134 lb 6.4 oz (61 kg)    GENERAL:alert, no distress and comfortable  NEURO: alert & oriented x 3 with fluent speech, no focal motor/sensory deficits  LABORATORY DATA:  I have reviewed the data as listed    Component Value Date/Time   NA 140 07/18/2022 0804   NA 139 09/19/2015 1422   K 4.0 07/18/2022 0804   CL 105 07/18/2022 0804   CO2 26 07/18/2022 0804   GLUCOSE 131 (H) 07/18/2022 0804   BUN 20 07/18/2022 0804   BUN 20 09/19/2015 1422   CREATININE 0.49 07/18/2022 0804   CALCIUM 10.0 07/18/2022 0804   PROT 6.9 07/18/2022 0804   PROT 7.0 09/19/2015 1422   ALBUMIN 4.1 07/18/2022 0804   ALBUMIN 4.6 09/19/2015 1422   AST 15 07/18/2022 0804   ALT 11 07/18/2022 0804   ALKPHOS 91 07/18/2022 0804   BILITOT 0.5 07/18/2022 0804   GFRNONAA >60 07/18/2022 0804   GFRAA >60 04/20/2018 1452    No results found for: "SPEP", "UPEP"  Lab  Results  Component Value Date   WBC 7.1 07/18/2022   NEUTROABS 5.3 07/18/2022   HGB 11.8 (L) 07/18/2022   HCT 36.0 07/18/2022   MCV 93.5 07/18/2022   PLT 187 07/18/2022      Chemistry      Component Value Date/Time   NA 140 07/18/2022 0804   NA 139 09/19/2015 1422   K 4.0 07/18/2022 0804   CL 105 07/18/2022 0804   CO2 26 07/18/2022 0804   BUN 20 07/18/2022 0804   BUN 20 09/19/2015 1422   CREATININE 0.49 07/18/2022 0804      Component Value Date/Time   CALCIUM 10.0 07/18/2022 0804   ALKPHOS 91 07/18/2022 0804   AST 15 07/18/2022 0804   ALT 11 07/18/2022 0804   BILITOT 0.5 07/18/2022 0804       RADIOGRAPHIC STUDIES: I have personally reviewed the radiological images as listed and agreed with the findings in the report. CT ABDOMEN PELVIS W CONTRAST  Result Date: 07/01/2022 CLINICAL DATA:  Abdominal pain. EXAM: CT ABDOMEN AND PELVIS WITH CONTRAST TECHNIQUE: Multidetector CT imaging of the abdomen and pelvis was performed using the standard protocol following bolus administration of intravenous contrast. RADIATION DOSE REDUCTION: This exam was performed according to the departmental dose-optimization program which includes automated exposure control, adjustment of the mA and/or kV according to patient size and/or use of iterative reconstruction technique. CONTRAST:  80mL OMNIPAQUE IOHEXOL 300 MG/ML  SOLN COMPARISON:  CT abdomen pelvis dated 03/17/2022. FINDINGS: Lower chest: The visualized lung bases are clear. No intra-abdominal free air.  No significant free fluid. Hepatobiliary: The liver is unremarkable. No biliary dilatation. The gallbladder is unremarkable. Pancreas: Unremarkable. No pancreatic ductal dilatation or surrounding inflammatory changes. Spleen: Normal in size without focal abnormality. Adrenals/Urinary Tract: The adrenal glands unremarkable. The kidneys, visualized ureters, and urinary bladder appear unremarkable. Stomach/Bowel: Small hiatal hernia. There is no bowel  obstruction or active inflammation. There is a 1 x 2 cm nodule in the pelvis with slight tethering of the adjacent bowel (66/2). This is suspicious for metastatic implant. The appendix is not visualized with certainty. No inflammatory changes identified in the right lower quadrant. Vascular/Lymphatic: Moderate aortoiliac atherosclerotic disease. The  IVC is unremarkable. No portal venous gas. There is no adenopathy. Reproductive: Hysterectomy. A 3.6 x 3.0 cm complex right adnexal mass most consistent with ovarian neoplasm. Other: There is omental nodularity and implants, slightly increased since the prior CT. Musculoskeletal: There is old L1 compression fracture. L4-L5 disc spacer and posterior fusion. No acute osseous pathology. IMPRESSION: 1. No acute intra-abdominal or pelvic pathology. 2. Complex right adnexal mass most consistent with ovarian neoplasm. 3. Omental nodularity and implants, slightly increased since the prior CT. 4. No bowel obstruction. 5.  Aortic Atherosclerosis (ICD10-I70.0). Electronically Signed   By: Elgie Collard M.D.   On: 07/01/2022 19:32

## 2022-07-18 NOTE — Assessment & Plan Note (Signed)
She had significant chronic pain syndrome related to problems with her back Her pain is poorly controlled at baseline So far, she denies worsening pain or peripheral neuropathy on treatment 

## 2022-07-18 NOTE — Progress Notes (Signed)
[facility] Spiritual Care Note  Followed up with Alexandria Mclaughlin in infusion. She reports being relieved to be past covid, kidney infection, and UTI, although she is still working to regain her energy.   Provided pastoral presence, emotional support, and reflective listening as she shared and processed some life review and hopes for the future. Specifically, she hopes to "live a couple more years to spend time with [her] grandchildren." Her grandson graduated from [employer] and is looking for work; her granddaughter is 2.  We plan to follow up by phone after her upcoming scans and to connect again in person at a future treatment.   [address] Rush Barer, [place], [facility] Pager [phone] Voicemail [phone]

## 2022-07-18 NOTE — Assessment & Plan Note (Signed)
She continues to tolerate treatment poorly with recurrent infection Ultimately, I recommend we reduce the dose of her chemotherapy down to reduce her risk of adverse effects I plan to repeat imaging study in 2 weeks for further follow-up We discussed the risk and benefits of reducing doses of chemotherapy

## 2022-07-18 NOTE — Patient Instructions (Signed)
[facility]  Discharge Instructions: Thank you for choosing [facility] to provide your oncology and hematology care.   If you have a lab appointment with the Cancer Center, please go directly to the Cancer Center and check in at the registration area.   Wear comfortable clothing and clothing appropriate for easy access to any Portacath or PICC line.   We strive to give you quality time with your provider. You may need to reschedule your appointment if you arrive late (15 or more minutes).  Arriving late affects you and other patients whose appointments are after yours.  Also, if you miss three or more appointments without notifying the office, you may be dismissed from the clinic at the provider's discretion.      For prescription refill requests, have your pharmacy contact our office and allow 72 hours for refills to be completed.    Today you received the following chemotherapy and/or immunotherapy agents: paclitaxel and carboplatin      To help prevent nausea and vomiting after your treatment, we encourage you to take your nausea medication as directed.  BELOW ARE SYMPTOMS THAT SHOULD BE REPORTED IMMEDIATELY: *FEVER GREATER THAN 100.4 F (38 C) OR HIGHER *CHILLS OR SWEATING *NAUSEA AND VOMITING THAT IS NOT CONTROLLED WITH YOUR NAUSEA MEDICATION *UNUSUAL SHORTNESS OF BREATH *UNUSUAL BRUISING OR BLEEDING *URINARY PROBLEMS (pain or burning when urinating, or frequent urination) *BOWEL PROBLEMS (unusual diarrhea, constipation, pain near the anus) TENDERNESS IN MOUTH AND THROAT WITH OR WITHOUT PRESENCE OF ULCERS (sore throat, sores in mouth, or a toothache) UNUSUAL RASH, SWELLING OR PAIN  UNUSUAL VAGINAL DISCHARGE OR ITCHING   Items with * indicate a potential emergency and should be followed up as soon as possible or go to the Emergency Department if any problems should occur.  Please show the CHEMOTHERAPY ALERT CARD or IMMUNOTHERAPY  ALERT CARD at check-in to the Emergency Department and triage nurse.  Should you have questions after your visit or need to cancel or reschedule your appointment, please contact [facility]  Dept: [phone]  and follow the prompts.  Office hours are 8:00 a.m. to 4:30 p.m. Monday - Friday. Please note that voicemails left after 4:00 p.m. may not be returned until the following business day.  We are closed weekends and major holidays. You have access to a nurse at all times for urgent questions. Please call the main number to the clinic Dept: [phone] and follow the prompts.   For any non-urgent questions, you may also contact your provider using MyChart. We now offer e-Visits for anyone 18 and older to request care online for non-urgent symptoms. For details visit [email or web address].   Also download the MyChart app! Go to the app store, search "MyChart", open the app, select [facility], and log in with your MyChart username and password.   

## 2022-07-18 NOTE — Assessment & Plan Note (Signed)
Her blood count is better with recent dose adjustment She will continue on slightly reduced dose of chemo

## 2022-07-22 ENCOUNTER — Inpatient Hospital Stay: Payer: Medicare HMO | Admitting: Licensed Clinical Social Worker

## 2022-07-22 NOTE — Progress Notes (Signed)
[facility] CSW Counseling Note  Patient was referred by new patient protocol. Treatment type: Individual  Presenting Concerns: Patient and/or family reports the following symptoms/concerns: depression Duration of problem: since diagnosis (Jan 2024); Severity of problem: moderate   Orientation:oriented to person, place, time/date, and situation.   Affect: Appropriate and Congruent Risk of harm to self or others: No plan to harm self or others  Patient and/or Family's Strengths/Protective Factors: Ability for insight  Communication skills , supportive son     Goals Addressed: Patient will:  Reduce symptoms of: depression Increase knowledge and/or ability of: coping skills  Increase healthy adjustment to current life circumstances   Progress towards Goals: Progressing   Interventions: Interventions utilized:  CBT and Strength-based      Assessment: Patient has had a difficult time since last visit, dealing with various infections and not receiving desired results from last CT (imaging will be repeated in 2 weeks). Pt is also grappling with the type of support, or lack thereof, received from certain family members. CSW provided space for patient to process and think about the two facets of her with one wanting to ignore what is happening and the other wanting to delve fully into it. Discussed ways of balancing and having time set to delve into her feelings around diagnosis and progress and potentially using writing as an outlet.  CSW also provided space for pt to share more about her life, good experiences as well as difficulties she has encountered. Pt often tries to utilize gratitude and perspective taking to help balance negative emotions.     Plan: Follow up with CSW: 08/13/22 Behavioral recommendations: continue to practice gratitude. When having difficult days, acknowledge them and allow time to grieve. Try writing about what you are experiencing, your life, and questions for your  medical team. Referral(s): Chaplain       Alexandria Rademaker E Elaura Calix, LCSW

## 2022-07-23 ENCOUNTER — Other Ambulatory Visit (HOSPITAL_COMMUNITY): Payer: Medicare HMO

## 2022-07-30 DIAGNOSIS — H35371 Puckering of macula, right eye: Secondary | ICD-10-CM | POA: Diagnosis not present

## 2022-07-30 DIAGNOSIS — H353132 Nonexudative age-related macular degeneration, bilateral, intermediate dry stage: Secondary | ICD-10-CM | POA: Diagnosis not present

## 2022-07-30 DIAGNOSIS — H401132 Primary open-angle glaucoma, bilateral, moderate stage: Secondary | ICD-10-CM | POA: Diagnosis not present

## 2022-07-30 DIAGNOSIS — H31093 Other chorioretinal scars, bilateral: Secondary | ICD-10-CM | POA: Diagnosis not present

## 2022-08-01 ENCOUNTER — Ambulatory Visit (HOSPITAL_COMMUNITY)
Admission: RE | Admit: 2022-08-01 | Discharge: 2022-08-01 | Disposition: A | Discharge status: Home / Self Care | Payer: Medicare HMO | Source: Ambulatory Visit | Attending: Hematology and Oncology | Admitting: Hematology and Oncology

## 2022-08-01 ENCOUNTER — Telehealth: Payer: Self-pay | Admitting: Oncology

## 2022-08-01 DIAGNOSIS — I2510 Atherosclerotic heart disease of native coronary artery without angina pectoris: Secondary | ICD-10-CM | POA: Diagnosis not present

## 2022-08-01 DIAGNOSIS — C569 Malignant neoplasm of unspecified ovary: Secondary | ICD-10-CM | POA: Insufficient documentation

## 2022-08-01 DIAGNOSIS — I700 Atherosclerosis of aorta: Secondary | ICD-10-CM | POA: Diagnosis not present

## 2022-08-01 DIAGNOSIS — C786 Secondary malignant neoplasm of retroperitoneum and peritoneum: Secondary | ICD-10-CM | POA: Insufficient documentation

## 2022-08-01 DIAGNOSIS — R918 Other nonspecific abnormal finding of lung field: Secondary | ICD-10-CM | POA: Insufficient documentation

## 2022-08-01 DIAGNOSIS — Z0389 Encounter for observation for other suspected diseases and conditions ruled out: Secondary | ICD-10-CM | POA: Diagnosis not present

## 2022-08-01 MED ORDER — IOHEXOL 9 MG/ML PO SOLN: 1000.0000 mL | ORAL | Status: AC

## 2022-08-01 MED ORDER — HEPARIN SOD (PORK) LOCK FLUSH 100 UNIT/ML IV SOLN
INTRAVENOUS | Status: AC
  Filled 2022-08-01: qty 5

## 2022-08-01 MED ORDER — IOHEXOL 300 MG/ML  SOLN
100.0000 mL | Freq: Once | INTRAMUSCULAR | Status: AC | PRN
  Administered 2022-08-01: 100 mL via INTRAVENOUS

## 2022-08-01 MED ORDER — HEPARIN SOD (PORK) LOCK FLUSH 100 UNIT/ML IV SOLN
500.0000 [IU] | Freq: Once | INTRAVENOUS | Status: AC
  Administered 2022-08-01: 500 [IU] via INTRAVENOUS

## 2022-08-01 MED ORDER — IOHEXOL 9 MG/ML PO SOLN
ORAL | Status: AC
  Filled 2022-08-01: qty 1000

## 2022-08-01 NOTE — Telephone Encounter (Signed)
Called Alexandria Mclaughlin and advised her of appointment with Dr. Alvester Morin on 08/04/22 at 8:30 to discuss surgery.  She verbalized understanding and agreement.

## 2022-08-04 ENCOUNTER — Inpatient Hospital Stay: Payer: Medicare HMO

## 2022-08-04 ENCOUNTER — Other Ambulatory Visit: Payer: Self-pay

## 2022-08-04 ENCOUNTER — Inpatient Hospital Stay: Payer: Medicare HMO | Attending: Psychiatry | Admitting: Psychiatry

## 2022-08-04 ENCOUNTER — Encounter: Payer: Self-pay | Admitting: Oncology

## 2022-08-04 VITALS — BP 128/67 | HR 75 | Temp 98.4°F | Resp 16 | Ht 63.39 in | Wt 132.4 lb

## 2022-08-04 DIAGNOSIS — M549 Dorsalgia, unspecified: Secondary | ICD-10-CM | POA: Diagnosis not present

## 2022-08-04 DIAGNOSIS — F419 Anxiety disorder, unspecified: Secondary | ICD-10-CM | POA: Diagnosis not present

## 2022-08-04 DIAGNOSIS — C569 Malignant neoplasm of unspecified ovary: Secondary | ICD-10-CM

## 2022-08-04 DIAGNOSIS — K5900 Constipation, unspecified: Secondary | ICD-10-CM | POA: Insufficient documentation

## 2022-08-04 DIAGNOSIS — D61818 Other pancytopenia: Secondary | ICD-10-CM | POA: Diagnosis not present

## 2022-08-04 DIAGNOSIS — Z87891 Personal history of nicotine dependence: Secondary | ICD-10-CM | POA: Insufficient documentation

## 2022-08-04 DIAGNOSIS — R5383 Other fatigue: Secondary | ICD-10-CM | POA: Insufficient documentation

## 2022-08-04 DIAGNOSIS — M5416 Radiculopathy, lumbar region: Secondary | ICD-10-CM | POA: Insufficient documentation

## 2022-08-04 DIAGNOSIS — Z5111 Encounter for antineoplastic chemotherapy: Secondary | ICD-10-CM | POA: Diagnosis not present

## 2022-08-04 DIAGNOSIS — C786 Secondary malignant neoplasm of retroperitoneum and peritoneum: Secondary | ICD-10-CM | POA: Diagnosis not present

## 2022-08-04 DIAGNOSIS — G894 Chronic pain syndrome: Secondary | ICD-10-CM | POA: Diagnosis not present

## 2022-08-04 DIAGNOSIS — F32A Depression, unspecified: Secondary | ICD-10-CM | POA: Insufficient documentation

## 2022-08-04 LAB — CMP (CANCER CENTER ONLY)
ALT: 12 U/L (ref 0–44)
AST: 13 U/L — ABNORMAL LOW (ref 15–41)
Albumin: 3.8 g/dL (ref 3.5–5.0)
Alkaline Phosphatase: 92 U/L (ref 38–126)
Anion gap: 6 (ref 5–15)
BUN: 16 mg/dL (ref 8–23)
CO2: 30 mmol/L (ref 22–32)
Calcium: 9.8 mg/dL (ref 8.9–10.3)
Chloride: 105 mmol/L (ref 98–111)
Creatinine: 0.49 mg/dL (ref 0.44–1.00)
GFR, Estimated: >60 mL/min (ref >60)
Glucose, Bld: 118 mg/dL — ABNORMAL HIGH (ref 70–99)
Potassium: 4.1 mmol/L (ref 3.5–5.1)
Sodium: 141 mmol/L (ref 135–145)
Total Bilirubin: 0.3 mg/dL (ref 0.3–1.2)
Total Protein: 6.7 g/dL (ref 6.5–8.1)

## 2022-08-04 LAB — CBC WITH DIFFERENTIAL (CANCER CENTER ONLY)
Abs Immature Granulocytes: 0.03 10*3/uL (ref 0.00–0.07)
Basophils Absolute: 0 10*3/uL (ref 0.0–0.1)
Basophils Relative: 0 %
Eosinophils Absolute: 0 10*3/uL (ref 0.0–0.5)
Eosinophils Relative: 0 %
HCT: 32.9 % — ABNORMAL LOW (ref 36.0–46.0)
Hemoglobin: 10.8 g/dL — ABNORMAL LOW (ref 12.0–15.0)
Immature Granulocytes: 1 %
Lymphocytes Relative: 31 %
Lymphs Abs: 1.5 10*3/uL (ref 0.7–4.0)
MCH: 31.7 pg (ref 26.0–34.0)
MCHC: 32.8 g/dL (ref 30.0–36.0)
MCV: 96.5 fL (ref 80.0–100.0)
Monocytes Absolute: 0.5 10*3/uL (ref 0.1–1.0)
Monocytes Relative: 11 %
Neutro Abs: 2.7 10*3/uL (ref 1.7–7.7)
Neutrophils Relative %: 57 %
Platelet Count: 188 10*3/uL (ref 150–400)
RBC: 3.41 MIL/uL — ABNORMAL LOW (ref 3.87–5.11)
RDW: 14.8 % (ref 11.5–15.5)
WBC Count: 4.7 10*3/uL (ref 4.0–10.5)
nRBC: 0 % (ref 0.0–0.2)

## 2022-08-04 MED ORDER — HEPARIN SOD (PORK) LOCK FLUSH 100 UNIT/ML IV SOLN
500.0000 [IU] | Freq: Once | INTRAVENOUS | Status: AC
  Administered 2022-08-04: 500 [IU]

## 2022-08-04 MED ORDER — SODIUM CHLORIDE 0.9% FLUSH
10.0000 mL | Freq: Once | INTRAVENOUS | Status: AC
  Administered 2022-08-04: 10 mL

## 2022-08-04 NOTE — Patient Instructions (Signed)
It was a pleasure to see you in clinic today. - We will continue with chemotherapy at this time.  Thank you very much for allowing me to provide care for you today.  I appreciate your confidence in choosing our Gynecologic Oncology team at [facility].  If you have any questions about your visit today please call our office or send [place] a MyChart message and we will get back to you as soon as possible.

## 2022-08-04 NOTE — Progress Notes (Signed)
Requested Foundation[employer]X testing and HRD testing on accession [id number] with [facility] via email.

## 2022-08-04 NOTE — Progress Notes (Signed)
Gynecologic Oncology Return Clinic Visit  Date of Service: 08/04/2022 Referring Provider: Vivien Rossetti, MD   Assessment & Plan: Alexandria Mclaughlin is a 82 y.o. 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 And now s/p 4C of carb/tax for neoadjuvant chemotherapy.  Ovarian cancer: -Interval imaging reviewed and overall with persistent extensive omental cake, carcinomatosis, overall stable burden of disease. - Imaging with suspicion for right upper lobe bronchopneumonia.  Patient is asymptomatic from this regard. - Reviewed goal of debulking surgery to be able to remove all visible disease.  Based on overall stable burden of disease on imaging combined with extensive disease noted intraoperatively previously with extensive adhesions of the bowel to the abdominal wall, do not recommend interval debulking at this time.  Feel that this would likely be a suboptimal surgery.  Recommend continued adjuvant treatment for 3 additional cycles and interval imaging again. - Patient will continue with Dr. Bertis Ruddy at this time with chemotherapy. - Will get CA125 today to assess ongoing response. -Prior germline testing with no pathogenic variants identified; ALK VUS noted. - Given advanced ovarian cancer, negative germline testing, recommend next generation sequencing with HRD to evaluate for response to PARP inhibitor.  RTC after additional chemo, repeat imaging for consideration of debulk.  Clide Cliff, MD Gynecologic Oncology   Medical Decision Making I personally spent  TOTAL 30 minutes face-to-face and non-face-to-face in the care of this patient, which includes all pre, intra, and post visit time on the date of service.  ----------------------- Reason for Visit:  Treatment counseling  Treatment History: Oncology History Overview Note  High grade serous   Ovarian cancer (HCC)  03/17/2022 Imaging   CT Abdomen/Pelvis: IMPRESSION: 1. Heterogeneous right adnexal mass measuring 3.8 x 3.0 cm with associated soft tissue thickening along the peritoneal reflections and greater omentum. Findings are highly suspicious for ovarian neoplasm with peritoneal carcinomatosis. 2. No evidence of bowel obstruction. 3. Coronary artery calcifications.   03/18/2022 Tumor Marker   CA125: 163   04/08/2022 Surgery   Diagnostic laparoscopy with peritoneal and omental biopsies  Findings: On entry to abdomen, omentum and large bowel broadly and densely adherent to the anterior abdominal wall in the midline from the level of the falciform ligament superiorly to the bladder inferiorly. Evidence of peritoneal plaques consistent with peritoneal carcinomatosis on the left diaphragm and left anterior abdominal wall and pelvic peritoneum. Right diaphragm and liver limited in visualization but no evidence of disease. Left liver lobe and stomach normal. Right abdomen not visualized due to the midline adhesions. Pelvic mass not visualized due to adhesions. Omentum adherent to the anterior abdominal wall consistent with omental cake. Small bowel and mesentery that is able to be visualized does not appear to have disease. Initial peritoneal biopsy indeterminate for carcinoma on frozen. Additional biopsies obtained.    04/08/2022 Initial Diagnosis   Ovarian cancer (HCC)   04/08/2022 Pathology Results   A. PERITONEUM, BIOPSY:  Microscopic focus of adenocarcinoma.  See comment.   B. PERITONEUM, #2, BIOPSY:  Microscopic focus of adenocarcinoma.   C. OMENTUM, BIOPSY:  Adenocarcinoma.  See comment.   COMMENT:  A. The permanent recuts of the peritoneal biopsy submitted for frozen  section show a microscopic focus of adenocarcinoma which is not  identified in the frozen section slides.    C.  The omental  specimen shows adenocarcinoma and immunohistochemistry  will be performed and reported as an addendum.  The morphologic features  favor an ovarian primary.   ADDENDUM:  Immunohistochemistry shows the carcinoma is positive with PAX8 and p16 with patchy positive staining with WT1 and estrogen receptor.  Progesterone receptor is negative.  P53 shows diffuse strong positivity (mutated pattern).  The morphology and immunophenotype are consistent with high-grade serous carcinoma.    04/08/2022 Pathology Results   CYTOLOGY FINAL MICROSCOPIC DIAGNOSIS:  - Malignant cells consistent with adenocarcinoma  - See comment    04/17/2022 Cancer Staging   Staging form: Ovary, Fallopian Tube, and Primary Peritoneal Carcinoma, AJCC 8th Edition - Clinical: FIGO Stage IIIC (cT3c, cN0, cM0) - Signed by Artis Delay, MD on 04/17/2022 Stage prefix: Initial diagnosis   04/30/2022 -  Chemotherapy   Patient is on Treatment Plan : OVARIAN Carboplatin (AUC 6) + Paclitaxel (175) q21d X 6 Cycles     05/02/2022 Tumor Marker   Patient's tumor was tested for the following markers: CA-125. Results of the tumor marker test revealed 193.   05/26/2022 Procedure   Technically successful right IJ power-injectable port catheter placement. Ready for routine use.     05/30/2022 Tumor Marker   Patient's tumor was tested for the following markers: CA-125. Results of the tumor marker test revealed 93.5.   07/04/2022 Genetic Testing   Negative [facility] Multi-Cancer +RNA Panel.  VUS in ALK at c.2239G>A (p.Gly747Arg).  Report date is 07/04/2022.   The Multi-Cancer + RNA Panel offered by [facility] includes sequencing and/or deletion/duplication analysis of the following 70 genes:  AIP*, ALK, APC*, ATM*, AXIN2*, BAP1*, BARD1*, BLM*, BMPR1A*, BRCA1*, BRCA2*, BRIP1*, CDC73*, CDH1*, CDK4, CDKN1B*, CDKN2A, CHEK2*, CTNNA1*, DICER1*, EPCAM (del/dup only), EGFR, FH*, FLCN*, GREM1 (promoter dup only), HOXB13, KIT, LZTR1, MAX*, MBD4,  MEN1*, MET, MITF, MLH1*, MSH2*, MSH3*, MSH6*, MUTYH*, NF1*, NF2*, NTHL1*, PALB2*, PDGFRA, PMS2*, POLD1*, POLE*, POT1*, PRKAR1A*, PTCH1*, PTEN*, RAD51C*, RAD51D*, RB1*, RET, SDHA* (sequencing only), SDHAF2*, SDHB*, SDHC*, SDHD*, SMAD4*, SMARCA4*, SMARCB1*, SMARCE1*, STK11*, SUFU*, TMEM127*, TP53*, TSC1*, TSC2*, VHL*. RNA analysis is performed for * genes.      Interval History: Patient presents today with family to discuss interval imaging and treatment plan.  She currently denies any shortness of breath, cough, or chest pain.  She is using a walker when out typically but does not need any assistive device at home.  She is only using a wheelchair today at hospital given the distances that she has to go.  She and her family would report that she is in bed about 50% of the day, but she reports this is mostly because there is "not much to do."  She does have fatigue that improves with rest.  She denies any nausea or vomiting and reports she is able to eat and drink without issue.  She has constipation for which she is taking senna 2 tablets daily and having a bowel movement about every 3 days.  She will take MiraLAX on the third day to help.  She denies any bleeding or abdominal/pelvic pain.  Her back pain is overall stable per her report.   Past Medical/Surgical History: Past Medical History:  Diagnosis Date   Abnormality of gait 02/07/2015   Acquired hallux rigidus of left foot 12/17/2020   Acquired unequal leg length on left 08/08/2011   AMS (altered mental status) 12/28/2020   Biceps tendonitis on left 08/18/2018   BPPV (benign paroxysmal positional vertigo) 02/07/2015   Bunion 12/17/2020   Cancer (HCC) 04/25/2022  Cerebrovascular disease    Cervical facet syndrome    Coronary artery disease 04/27/2022   Disorders of sacrum    Enthesopathy of hip region    Essential hypertension 02/07/2015   Generalized anxiety disorder    GERD (gastroesophageal reflux disease)    Headache    History  of kidney stones    HLD (hyperlipidemia) 02/07/2015   Low back pain 02/07/2015   Lumbar facet arthropathy 07/05/2014   Lumbar post-laminectomy syndrome 06/11/2011   Lumbar radicular pain 07/29/2016   Lumbar radiculopathy 04/26/2018   Major depressive disorder    Mild cognitive impairment of uncertain or unknown etiology 01/24/2022   Multiple lacunar infarcts    MRI - bilateral basal ganglia and left thalamus   Myoclonus 09/19/2015   Rotator cuff tendonitis, left 06/08/2018   Sacroiliac joint dysfunction 06/11/2011   Sciatic neuritis    Stroke (HCC)    hx TIA   Subcortical infarction    2016 MRI - small chronic subcortical infarct with associated chronic hemosiderin deposition within the right superior frontal gyrus.   Synovitis and tenosynovitis    Therapeutic opioid induced constipation 07/25/2015   Thyroid disease    Transient left leg weakness    Vision abnormalities     Past Surgical History:  Procedure Laterality Date   ABDOMINAL HYSTERECTOMY     Due to fibroids   BACK SURGERY  2011   lumbar Fusion   BLADDER SURGERY  1985   CESAREAN SECTION     x2   EYE SURGERY Bilateral    Cataract surgery with IOL   FOOT SURGERY Right    "just scraped the bone"   IR IMAGING GUIDED PORT INSERTION  05/26/2022   LAPAROSCOPY N/A 04/08/2022   Procedure: LAPAROSCOPY DIAGNOSTIC WITH BIOPSIES;  Surgeon: Clide Cliff, MD;  Location: [facility];  Service: Gynecology;  Laterality: N/A;   RETINAL DETACHMENT SURGERY Right 10/2011   TRANSFORAMINAL LUMBAR INTERBODY FUSION W/ MIS 1 LEVEL Right 04/26/2018   Procedure: Right Lumbar four-five Minimally invasive Transforaminal lumbar interbody fusion;  Surgeon: Jadene Pierini, MD;  Location: [facility];  Service: Neurosurgery;  Laterality: Right;    Family History  Problem Relation Age of Onset   Diabetes Mother    Heart disease Mother    Stroke Mother    Heart disease Father    Heart disease Brother    Colon cancer Neg Hx    Breast cancer Neg  Hx    Ovarian cancer Neg Hx    Endometrial cancer Neg Hx    Pancreatic cancer Neg Hx    Prostate cancer Neg Hx     Social History   Socioeconomic History   Marital status: Widowed    Spouse name: Not on file   Number of children: Not on file   Years of education: 14   Highest education level: Associate degree: academic program  Occupational History   Occupation: Retired  Tobacco Use   Smoking status: Former    Packs/day: 0.25    Years: 15.00    Additional pack years: 0.00    Total pack years: 3.75    Types: Cigarettes    Quit date: 03/15/1968    Years since quitting: 54.4   Smokeless tobacco: Never  Vaping Use   Vaping Use: Never used  Substance and Sexual Activity   Alcohol use: Not Currently    Comment: occasional glass of wine   Drug use: Yes    Frequency: 20.0 times per week    Types: Fentanyl, Hydrocodone  Sexual activity: Not Currently  Other Topics Concern   Not on file  Social History Narrative   Right handed   Drinks caffeine   Condo two story with Engineer, structural   Social Determinants of Health   Financial Resource Strain: Not on file  Food Insecurity: Not on file  Transportation Needs: Not on file  Physical Activity: Not on file  Stress: Not on file  Social Connections: Not on file    Current Medications:  Current Outpatient Medications:    ampicillin (PRINCIPEN) 500 MG capsule, Take 1 capsule (500 mg total) by mouth 3 (three) times daily., Disp: 30 capsule, Rfl: 0   aspirin-acetaminophen-caffeine (EXCEDRIN MIGRAINE) 250-250-65 MG tablet, Take 1 tablet by mouth every 6 (six) hours as needed for headache or migraine., Disp: , Rfl:    Cholecalciferol (VITAMIN D) 50 MCG (2000 UT) CAPS, Take 2,000 Units by mouth daily., Disp: , Rfl:    clonazePAM (KLONOPIN) 0.5 MG tablet, TAKE 1 TABLET BY MOUTH AT BEDTIME AS NEEDED ANXIETY, Disp: 30 tablet, Rfl: 2   cyanocobalamin 1000 MCG tablet, 1,000 mcg daily., Disp: , Rfl:    dexamethasone (DECADRON) 4 MG tablet, Take  2 tabs at the night before and 2 tab the morning of chemotherapy, every 3 weeks, by mouth x 6 cycles, Disp: 24 tablet, Rfl: 6   DULoxetine (CYMBALTA) 60 MG capsule, Take 60 mg by mouth daily., Disp: , Rfl:    fentaNYL (DURAGESIC) 25 MCG/HR, Place 1 patch onto the skin every 3 (three) days., Disp: 10 patch, Rfl: 0   gabapentin (NEURONTIN) 600 MG tablet, TAKE 1 TABLET BY MOUTH FOUR TIMES A DAY, Disp: 360 tablet, Rfl: 1   irbesartan (AVAPRO) 150 MG tablet, Take 150 mg by mouth daily., Disp: , Rfl:    lactulose (CHRONULAC) 10 GM/15ML solution, Take 15 mLs (10 g total) by mouth 3 (three) times daily., Disp: 473 mL, Rfl: 1   magnesium oxide (MAG-OX) 400 (240 Mg) MG tablet, TAKE 1 TABLET BY MOUTH EVERY DAY, Disp: 30 tablet, Rfl: 1   Melatonin 10 MG TABS, Take 10 mg by mouth at bedtime as needed (sleep)., Disp: , Rfl:    methimazole (TAPAZOLE) 5 MG tablet, Take 5 mg by mouth 2 (two) times a week., Disp: , Rfl:    ondansetron (ZOFRAN) 8 MG tablet, Take 1 tablet (8 mg total) by mouth every 8 (eight) hours as needed for nausea or vomiting. Start on the third day after carboplatin., Disp: 30 tablet, Rfl: 1   oxyCODONE-acetaminophen (PERCOCET) 10-325 MG tablet, Take 1 tablet by mouth 5 (five) times daily as needed for pain., Disp: 130 tablet, Rfl: 0   polyethylene glycol (MIRALAX / GLYCOLAX) 17 g packet, Take 17 g by mouth 2 (two) times daily., Disp: , Rfl:    prochlorperazine (COMPAZINE) 10 MG tablet, Take 1 tablet (10 mg total) by mouth every 6 (six) hours as needed for nausea or vomiting., Disp: 30 tablet, Rfl: 1   rosuvastatin (CRESTOR) 40 MG tablet, Take 1 tablet (40 mg total) by mouth at bedtime., Disp: 90 tablet, Rfl: 3   senna (SENOKOT) 8.6 MG tablet, Take 2 tablets by mouth 2 (two) times daily., Disp: , Rfl:    traZODone (DESYREL) 100 MG tablet, TAKE 1 TABLET BY MOUTH EVERYDAY AT BEDTIME, Disp: 90 tablet, Rfl: 2  Review of Symptoms: Complete 10-system review includes the following: back pain, anxiety,  mouth sores, depression  Physical Exam: BP 128/67 (BP Location: Left Arm, Patient Position: Sitting)   Pulse 75  Temp 98.4 F (36.9 C) (Oral)   Resp 16   Ht 5' 3.39" (1.61 m)   Wt 132 lb 6.4 oz (60.1 kg)   SpO2 94%   BMI 23.17 kg/m  General: Alert, oriented, no acute distress. HEENT: Normocephalic, atraumatic. Neck symmetric without masses. Sclera anicteric.  Chest: Normal work of breathing. Clear to auscultation bilaterally.  . Cardiovascular: Regular rate and rhythm, no murmurs. Extremities: Warm, well perfused.  No edema bilaterally. In wheelchair Skin: No rashes or lesions noted.   Laboratory & Radiologic Studies: CT CHEST ABDOMEN PELVIS W CONTRAST 08/01/2022  Narrative CLINICAL DATA:  82 year old female with history of ovarian cancer. Follow-up study to evaluate response to therapy. * Tracking Code: BO *  EXAM: CT CHEST, ABDOMEN, AND PELVIS WITH CONTRAST  TECHNIQUE: Multidetector CT imaging of the chest, abdomen and pelvis was performed following the standard protocol during bolus administration of intravenous contrast.  RADIATION DOSE REDUCTION: This exam was performed according to the departmental dose-optimization program which includes automated exposure control, adjustment of the mA and/or kV according to patient size and/or use of iterative reconstruction technique.  CONTRAST:  OMNIPAQUE IOHEXOL 300 MG/ML  SOLN  COMPARISON:  CT of the abdomen and pelvis 07/01/2022. Chest CT 04/18/2022.  FINDINGS: CT CHEST FINDINGS  Cardiovascular: Heart size is normal. There is no significant pericardial fluid, thickening or pericardial calcification. There is aortic atherosclerosis, as well as atherosclerosis of the great vessels of the mediastinum and the coronary arteries, including calcified atherosclerotic plaque in the left main, left anterior descending, left circumflex and right coronary arteries. Right internal jugular single-lumen Port-A-Cath with  tip terminating at the superior cavoatrial junction.  Mediastinum/Nodes: No pathologically enlarged mediastinal or hilar lymph nodes. Esophagus is unremarkable in appearance. No axillary lymphadenopathy.  Lungs/Pleura: Several small solid-appearing pulmonary nodules are again noted throughout the lungs bilaterally, largest of which measures only 5 mm in the right lower lobe (axial image 104 of series 6), similar to the prior study from 04/18/2022. No larger more suspicious appearing pulmonary nodules or masses are noted. In addition, in the right upper lobe there is new clustered peribronchovascular ground-glass attenuation and micro nodularity, suggestive of an acute bronchopneumonia. No pleural effusions.  Musculoskeletal: There are no aggressive appearing lytic or blastic lesions noted in the visualized portions of the skeleton.  CT ABDOMEN PELVIS FINDINGS  Hepatobiliary: No suspicious cystic or solid hepatic lesions. No intra or extrahepatic biliary ductal dilatation. Gallbladder is unremarkable in appearance.  Pancreas: No pancreatic mass. No pancreatic ductal dilatation. No pancreatic or peripancreatic fluid collections or inflammatory changes.  Spleen: Unremarkable.  Adrenals/Urinary Tract: Bilateral kidneys and bilateral adrenal glands are normal in appearance. No hydroureteronephrosis. Urinary bladder is unremarkable in appearance.  Stomach/Bowel: The appearance of the stomach is normal. No pathologic dilatation of small bowel or colon. Appendix is not confidently identified, likely surgically absent.  Vascular/Lymphatic: Aortic atherosclerosis, without evidence of aneurysm or dissection in the abdominal or pelvic vasculature. No definite lymphadenopathy confidently identified in the abdomen or pelvis.  Reproductive: Complex mixed cystic and solid heterogeneously enhancing lesion in the right ovary (axial image 96 of series 2 and coronal image 39 of series 4)  measuring 3.9 x 3.6 x 2.2 cm, compatible with the reported ovarian neoplasm, similar to the prior examination. Left ovary is not confidently identified may be surgically absent or atrophic. Status post hysterectomy.  Other: Widespread peritoneal nodularity is again noted, indicative of extensive intraperitoneal metastatic disease, most evident in the region of the omentum, best appreciated  on axial image 82 of series 2 where there is a confluence of omental nodularity which is grossly similar to the prior study measuring approximately 8.4 x 1.8 cm. Small soft tissue implants in the low anatomic pelvis are again noted, measuring up to 2.2 x 1.1 cm medial to the sigmoid colon (axial image 101 of series 2), similar to the prior study. Trace ascites. No pneumoperitoneum.  Musculoskeletal: Status post PLIF at L4-L5 with interbody cage at L4-L5 interspace. Chronic appearing compression fracture of L1 with up to 50% loss of central vertebral body height, unchanged. Small well-defined sclerotic lesion with narrow zone of transition in the posterior aspect of the L2 vertebral body, unchanged, likely a bone island. There are no aggressive appearing lytic or blastic lesions noted in the visualized portions of the skeleton.  IMPRESSION: 1. Today's study demonstrates stable burden of disease. Specifically, previously noted right ovarian mass appears grossly unchanged, as does the extent of intraperitoneal metastatic disease, as detailed above. 2. Multiple small solid-appearing pulmonary nodules measuring 5 mm or less noted on the prior examination are unchanged. 3. Probable right upper lobe bronchopneumonia, as above. 4. Aortic atherosclerosis, in addition to left main and three-vessel coronary artery disease. 5. Additional incidental findings, as above.   Electronically Signed By: Trudie Reed M.D. On: 08/04/2022 08:21

## 2022-08-06 DIAGNOSIS — C569 Malignant neoplasm of unspecified ovary: Secondary | ICD-10-CM | POA: Diagnosis not present

## 2022-08-06 DIAGNOSIS — E0590 Thyrotoxicosis, unspecified without thyrotoxic crisis or storm: Secondary | ICD-10-CM | POA: Diagnosis not present

## 2022-08-06 LAB — CA 125: Cancer Antigen (CA) 125: 41.6 U/mL — ABNORMAL HIGH (ref 0.0–38.1)

## 2022-08-07 ENCOUNTER — Encounter: Payer: Self-pay | Admitting: General Practice

## 2022-08-07 MED FILL — Fosaprepitant Dimeglumine For IV Infusion 150 MG (Base Eq): INTRAVENOUS | Qty: 5 | Status: AC

## 2022-08-07 MED FILL — Dexamethasone Sodium Phosphate Inj 100 MG/10ML: INTRAMUSCULAR | Qty: 1 | Status: AC

## 2022-08-07 NOTE — Progress Notes (Signed)
[facility] Spiritual Care Note  Followed up with Alexandria Mclaughlin by phone to connect about updates regarding treatment plan. She notes that she is generally in a holding pattern with treatment for now, explaining that it appears to be inhibiting growth, but not shrinking anything.  Her top concern right now is finding a church that is Biblically based and holds traditional services that are not evangelical or praise-oriented in style. Chaplain will continue searching and report back any leads that might be a match for her.  Plan to follow up at a future treatment, and Alexandria Mclaughlin is aware of ongoing chaplain availability in the meantime, as well.   [address] [place], [place], [facility] Pager [phone] Voicemail [phone]

## 2022-08-08 ENCOUNTER — Encounter: Payer: Self-pay | Admitting: Hematology and Oncology

## 2022-08-08 ENCOUNTER — Inpatient Hospital Stay: Payer: Medicare HMO

## 2022-08-08 ENCOUNTER — Inpatient Hospital Stay: Payer: Medicare HMO | Admitting: Hematology and Oncology

## 2022-08-08 ENCOUNTER — Telehealth: Payer: Self-pay

## 2022-08-08 ENCOUNTER — Other Ambulatory Visit: Payer: Self-pay

## 2022-08-08 VITALS — BP 141/79 | HR 80 | Resp 16

## 2022-08-08 VITALS — BP 132/72 | HR 117 | Temp 97.9°F | Resp 18 | Ht 63.0 in | Wt 132.8 lb

## 2022-08-08 DIAGNOSIS — C786 Secondary malignant neoplasm of retroperitoneum and peritoneum: Secondary | ICD-10-CM | POA: Diagnosis not present

## 2022-08-08 DIAGNOSIS — R5383 Other fatigue: Secondary | ICD-10-CM | POA: Diagnosis not present

## 2022-08-08 DIAGNOSIS — D61818 Other pancytopenia: Secondary | ICD-10-CM

## 2022-08-08 DIAGNOSIS — C569 Malignant neoplasm of unspecified ovary: Secondary | ICD-10-CM | POA: Diagnosis not present

## 2022-08-08 DIAGNOSIS — M5416 Radiculopathy, lumbar region: Secondary | ICD-10-CM | POA: Diagnosis not present

## 2022-08-08 DIAGNOSIS — Z5111 Encounter for antineoplastic chemotherapy: Secondary | ICD-10-CM | POA: Diagnosis not present

## 2022-08-08 DIAGNOSIS — K5900 Constipation, unspecified: Secondary | ICD-10-CM | POA: Diagnosis not present

## 2022-08-08 DIAGNOSIS — M549 Dorsalgia, unspecified: Secondary | ICD-10-CM | POA: Diagnosis not present

## 2022-08-08 DIAGNOSIS — G894 Chronic pain syndrome: Secondary | ICD-10-CM | POA: Diagnosis not present

## 2022-08-08 DIAGNOSIS — F419 Anxiety disorder, unspecified: Secondary | ICD-10-CM | POA: Diagnosis not present

## 2022-08-08 MED ORDER — SODIUM CHLORIDE 0.9 % IV SOLN: Freq: Once | INTRAVENOUS | Status: AC

## 2022-08-08 MED ORDER — PALONOSETRON HCL INJECTION 0.25 MG/5ML
0.2500 mg | Freq: Once | INTRAVENOUS | Status: AC
  Administered 2022-08-08: 0.25 mg via INTRAVENOUS
  Filled 2022-08-08: qty 5

## 2022-08-08 MED ORDER — SODIUM CHLORIDE 0.9 % IV SOLN
150.0000 mg | Freq: Once | INTRAVENOUS | Status: AC
  Administered 2022-08-08: 150 mg via INTRAVENOUS
  Filled 2022-08-08: qty 150

## 2022-08-08 MED ORDER — SODIUM CHLORIDE 0.9% FLUSH
10.0000 mL | INTRAVENOUS | Status: DC | PRN
  Administered 2022-08-08: 10 mL

## 2022-08-08 MED ORDER — HEPARIN SOD (PORK) LOCK FLUSH 100 UNIT/ML IV SOLN
500.0000 [IU] | Freq: Once | INTRAVENOUS | Status: AC | PRN
  Administered 2022-08-08: 500 [IU]

## 2022-08-08 MED ORDER — SODIUM CHLORIDE 0.9 % IV SOLN
105.0000 mg/m2 | Freq: Once | INTRAVENOUS | Status: AC
  Administered 2022-08-08: 174 mg via INTRAVENOUS
  Filled 2022-08-08: qty 29

## 2022-08-08 MED ORDER — CETIRIZINE HCL 10 MG/ML IV SOLN
10.0000 mg | Freq: Once | INTRAVENOUS | Status: AC
  Administered 2022-08-08: 10 mg via INTRAVENOUS
  Filled 2022-08-08: qty 1

## 2022-08-08 MED ORDER — SODIUM CHLORIDE 0.9 % IV SOLN
332.0000 mg | Freq: Once | INTRAVENOUS | Status: AC
  Administered 2022-08-08: 330 mg via INTRAVENOUS
  Filled 2022-08-08: qty 33

## 2022-08-08 MED ORDER — SODIUM CHLORIDE 0.9 % IV SOLN
10.0000 mg | Freq: Once | INTRAVENOUS | Status: AC
  Administered 2022-08-08: 10 mg via INTRAVENOUS
  Filled 2022-08-08: qty 10

## 2022-08-08 MED ORDER — FAMOTIDINE IN NACL 20-0.9 MG/50ML-% IV SOLN
20.0000 mg | Freq: Once | INTRAVENOUS | Status: AC
  Administered 2022-08-08: 20 mg via INTRAVENOUS
  Filled 2022-08-08: qty 50

## 2022-08-08 NOTE — Assessment & Plan Note (Signed)
I have personally reviewed her blood work and imaging studies Overall, she has positive response to therapy The plan will be to continue chemotherapy for few more cycles with plan to repeat imaging study again in August Due to good favorable response and she tolerated last cycle therapy well, I plan to increase the dose of paclitaxel little bit

## 2022-08-08 NOTE — Assessment & Plan Note (Signed)
Her blood count is better with recent dose adjustment I felt that she can tolerate slightly higher dose of chemotherapy and I will increase the dose of paclitaxel

## 2022-08-08 NOTE — Patient Instructions (Signed)
[facility]  Discharge Instructions: Thank you for choosing [facility] to provide your oncology and hematology care.   If you have a lab appointment with the Cancer Center, please go directly to the Cancer Center and check in at the registration area.   Wear comfortable clothing and clothing appropriate for easy access to any Portacath or PICC line.   We strive to give you quality time with your provider. You may need to reschedule your appointment if you arrive late (15 or more minutes).  Arriving late affects you and other patients whose appointments are after yours.  Also, if you miss three or more appointments without notifying the office, you may be dismissed from the clinic at the provider's discretion.      For prescription refill requests, have your pharmacy contact our office and allow 72 hours for refills to be completed.    Today you received the following chemotherapy and/or immunotherapy agents - Taxol/Carboplatin      To help prevent nausea and vomiting after your treatment, we encourage you to take your nausea medication as directed.  BELOW ARE SYMPTOMS THAT SHOULD BE REPORTED IMMEDIATELY: *FEVER GREATER THAN 100.4 F (38 C) OR HIGHER *CHILLS OR SWEATING *NAUSEA AND VOMITING THAT IS NOT CONTROLLED WITH YOUR NAUSEA MEDICATION *UNUSUAL SHORTNESS OF BREATH *UNUSUAL BRUISING OR BLEEDING *URINARY PROBLEMS (pain or burning when urinating, or frequent urination) *BOWEL PROBLEMS (unusual diarrhea, constipation, pain near the anus) TENDERNESS IN MOUTH AND THROAT WITH OR WITHOUT PRESENCE OF ULCERS (sore throat, sores in mouth, or a toothache) UNUSUAL RASH, SWELLING OR PAIN  UNUSUAL VAGINAL DISCHARGE OR ITCHING   Items with * indicate a potential emergency and should be followed up as soon as possible or go to the Emergency Department if any problems should occur.  Please show the CHEMOTHERAPY ALERT CARD or IMMUNOTHERAPY ALERT CARD  at check-in to the Emergency Department and triage nurse.  Should you have questions after your visit or need to cancel or reschedule your appointment, please contact [facility]  Dept: [phone]  and follow the prompts.  Office hours are 8:00 a.m. to 4:30 p.m. Monday - Friday. Please note that voicemails left after 4:00 p.m. may not be returned until the following business day.  We are closed weekends and major holidays. You have access to a nurse at all times for urgent questions. Please call the main number to the clinic Dept: [phone] and follow the prompts.   For any non-urgent questions, you may also contact your provider using MyChart. We now offer e-Visits for anyone 18 and older to request care online for non-urgent symptoms. For details visit [email or web address].   Also download the MyChart app! Go to the app store, search "MyChart", open the app, select , and log in with your MyChart username and password.  

## 2022-08-08 NOTE — Progress Notes (Signed)
[facility] OFFICE PROGRESS NOTE  Patient Care Team: Georgianne Fick, MD as PCP - General (Internal Medicine) Parke Poisson, MD as PCP - Cardiology (Cardiology) Drema Dallas, DO as Consulting Physician (Neurology)  ASSESSMENT & PLAN:  Ovarian cancer [facility]) I have personally reviewed her blood work and imaging studies Overall, she has positive response to therapy The plan will be to continue chemotherapy for few more cycles with plan to repeat imaging study again in August Due to good favorable response and she tolerated last cycle therapy well, I plan to increase the dose of paclitaxel little bit  Pancytopenia, acquired (HCC) Her blood count is better with recent dose adjustment I felt that she can tolerate slightly higher dose of chemotherapy and I will increase the dose of paclitaxel  Lumbar radiculopathy She had significant chronic pain syndrome related to problems with her back Her pain is poorly controlled at baseline So far, she denies worsening pain or peripheral neuropathy on treatment  Orders Placed This Encounter  Procedures   CBC with Differential (Cancer Center Only)    Standing Status:   Future    Standing Expiration Date:   09/20/2023   CMP (Cancer Center only)    Standing Status:   Future    Standing Expiration Date:   09/20/2023    All questions were answered. The patient knows to call the clinic with any problems, questions or concerns. The total time spent in the appointment was 40 minutes encounter with patients including review of chart and various tests results, discussions about plan of care and coordination of care plan   Artis Delay, MD 08/08/2022 9:22 AM  INTERVAL HISTORY: Please see below for problem oriented charting. she returns for treatment follow-up with her son Overall, she tolerated last cycle therapy well Denies worsening pain no neuropathy No recent severe constipation We discussed test results and imaging studies  results and discussed future plan of care  REVIEW OF SYSTEMS:   Constitutional: Denies fevers, chills or abnormal weight loss Eyes: Denies blurriness of vision Ears, nose, mouth, throat, and face: Denies mucositis or sore throat Respiratory: Denies cough, dyspnea or wheezes Cardiovascular: Denies palpitation, chest discomfort or lower extremity swelling Gastrointestinal:  Denies nausea, heartburn or change in bowel habits Skin: Denies abnormal skin rashes Lymphatics: Denies new lymphadenopathy or easy bruising Neurological:Denies numbness, tingling or new weaknesses Behavioral/Psych: Mood is stable, no new changes  All other systems were reviewed with the patient and are negative.  I have reviewed the past medical history, past surgical history, social history and family history with the patient and they are unchanged from previous note.  ALLERGIES:  has No Known Allergies.  MEDICATIONS:  Current Outpatient Medications  Medication Sig Dispense Refill   ampicillin (PRINCIPEN) 500 MG capsule Take 1 capsule (500 mg total) by mouth 3 (three) times daily. 30 capsule 0   aspirin-acetaminophen-caffeine (EXCEDRIN MIGRAINE) 250-250-65 MG tablet Take 1 tablet by mouth every 6 (six) hours as needed for headache or migraine.     Cholecalciferol (VITAMIN D) 50 MCG (2000 UT) CAPS Take 2,000 Units by mouth daily.     clonazePAM (KLONOPIN) 0.5 MG tablet TAKE 1 TABLET BY MOUTH AT BEDTIME AS NEEDED ANXIETY 30 tablet 2   cyanocobalamin 1000 MCG tablet 1,000 mcg daily.     dexamethasone (DECADRON) 4 MG tablet Take 2 tabs at the night before and 2 tab the morning of chemotherapy, every 3 weeks, by mouth x 6 cycles 24 tablet 6   DULoxetine (CYMBALTA)  60 MG capsule Take 60 mg by mouth daily.     fentaNYL (DURAGESIC) 25 MCG/HR Place 1 patch onto the skin every 3 (three) days. 10 patch 0   gabapentin (NEURONTIN) 600 MG tablet TAKE 1 TABLET BY MOUTH FOUR TIMES A DAY 360 tablet 1   irbesartan (AVAPRO) 150 MG  tablet Take 150 mg by mouth daily.     lactulose (CHRONULAC) 10 GM/15ML solution Take 15 mLs (10 g total) by mouth 3 (three) times daily. 473 mL 1   magnesium oxide (MAG-OX) 400 (240 Mg) MG tablet TAKE 1 TABLET BY MOUTH EVERY DAY 30 tablet 1   Melatonin 10 MG TABS Take 10 mg by mouth at bedtime as needed (sleep).     methimazole (TAPAZOLE) 5 MG tablet Take 5 mg by mouth 2 (two) times a week.     ondansetron (ZOFRAN) 8 MG tablet Take 1 tablet (8 mg total) by mouth every 8 (eight) hours as needed for nausea or vomiting. Start on the third day after carboplatin. 30 tablet 1   oxyCODONE-acetaminophen (PERCOCET) 10-325 MG tablet Take 1 tablet by mouth 5 (five) times daily as needed for pain. 130 tablet 0   polyethylene glycol (MIRALAX / GLYCOLAX) 17 g packet Take 17 g by mouth 2 (two) times daily.     prochlorperazine (COMPAZINE) 10 MG tablet Take 1 tablet (10 mg total) by mouth every 6 (six) hours as needed for nausea or vomiting. 30 tablet 1   rosuvastatin (CRESTOR) 40 MG tablet Take 1 tablet (40 mg total) by mouth at bedtime. 90 tablet 3   senna (SENOKOT) 8.6 MG tablet Take 2 tablets by mouth 2 (two) times daily.     traZODone (DESYREL) 100 MG tablet TAKE 1 TABLET BY MOUTH EVERYDAY AT BEDTIME 90 tablet 2   No current facility-administered medications for this visit.   Facility-Administered Medications Ordered in Other Visits  Medication Dose Route Frequency Provider Last Rate Last Admin   0.9 %  sodium chloride infusion   Intravenous Once Bertis Ruddy, Kamin Niblack, MD       CARBOplatin (PARAPLATIN) 330 mg in sodium chloride 0.9 % 100 mL chemo infusion  330 mg Intravenous Once Bertis Ruddy, Kalid Ghan, MD       cetirizine (QUZYTTIR) injection 10 mg  10 mg Intravenous Once Bertis Ruddy, Willford Rabideau, MD       dexamethasone (DECADRON) 10 mg in sodium chloride 0.9 % 50 mL IVPB  10 mg Intravenous Once Bertis Ruddy, Eudell Mcphee, MD       famotidine (PEPCID) IVPB 20 mg premix  20 mg Intravenous Once Bertis Ruddy, Sukhdeep Wieting, MD       fosaprepitant (EMEND) 150 mg in  sodium chloride 0.9 % 145 mL IVPB  150 mg Intravenous Once Bertis Ruddy, Emrys Mceachron, MD       heparin lock flush 100 unit/mL  500 Units Intracatheter Once PRN Bertis Ruddy, Trennon Torbeck, MD       PACLitaxel (TAXOL) 174 mg in sodium chloride 0.9 % 250 mL chemo infusion (> 80mg /m2)  105 mg/m2 (Treatment Plan Recorded) Intravenous Once Bertis Ruddy, Lindalou Soltis, MD       palonosetron (ALOXI) injection 0.25 mg  0.25 mg Intravenous Once Ahlia Lemanski, MD       sodium chloride flush (NS) 0.9 % injection 10 mL  10 mL Intracatheter PRN Artis Delay, MD        SUMMARY OF ONCOLOGIC HISTORY: Oncology History Overview Note  High grade serous   Ovarian cancer (HCC)  03/17/2022 Imaging   CT Abdomen/Pelvis: IMPRESSION: 1. Heterogeneous right adnexal mass measuring  3.8 x 3.0 cm with associated soft tissue thickening along the peritoneal reflections and greater omentum. Findings are highly suspicious for ovarian neoplasm with peritoneal carcinomatosis. 2. No evidence of bowel obstruction. 3. Coronary artery calcifications.   03/18/2022 Tumor Marker   CA125: 163   04/08/2022 Surgery   Diagnostic laparoscopy with peritoneal and omental biopsies  Findings: On entry to abdomen, omentum and large bowel broadly and densely adherent to the anterior abdominal wall in the midline from the level of the falciform ligament superiorly to the bladder inferiorly. Evidence of peritoneal plaques consistent with peritoneal carcinomatosis on the left diaphragm and left anterior abdominal wall and pelvic peritoneum. Right diaphragm and liver limited in visualization but no evidence of disease. Left liver lobe and stomach normal. Right abdomen not visualized due to the midline adhesions. Pelvic mass not visualized due to adhesions. Omentum adherent to the anterior abdominal wall consistent with omental cake. Small bowel and mesentery that is able to be visualized does not appear to have disease. Initial peritoneal biopsy indeterminate for carcinoma on frozen. Additional  biopsies obtained.    04/08/2022 Initial Diagnosis   Ovarian cancer (HCC)   04/08/2022 Pathology Results   A. PERITONEUM, BIOPSY:  Microscopic focus of adenocarcinoma.  See comment.   B. PERITONEUM, #2, BIOPSY:  Microscopic focus of adenocarcinoma.   C. OMENTUM, BIOPSY:  Adenocarcinoma.  See comment.   COMMENT:  A. The permanent recuts of the peritoneal biopsy submitted for frozen  section show a microscopic focus of adenocarcinoma which is not  identified in the frozen section slides.   C.  The omental specimen shows adenocarcinoma and immunohistochemistry  will be performed and reported as an addendum.  The morphologic features  favor an ovarian primary.   ADDENDUM:  Immunohistochemistry shows the carcinoma is positive with PAX8 and p16 with patchy positive staining with WT1 and estrogen receptor.  Progesterone receptor is negative.  P53 shows diffuse strong positivity (mutated pattern).  The morphology and immunophenotype are consistent with high-grade serous carcinoma.    04/08/2022 Pathology Results   CYTOLOGY FINAL MICROSCOPIC DIAGNOSIS:  - Malignant cells consistent with adenocarcinoma  - See comment    04/17/2022 Cancer Staging   Staging form: Ovary, Fallopian Tube, and Primary Peritoneal Carcinoma, AJCC 8th Edition - Clinical: FIGO Stage IIIC (cT3c, cN0, cM0) - Signed by Artis Delay, MD on 04/17/2022 Stage prefix: Initial diagnosis   04/30/2022 -  Chemotherapy   Patient is on Treatment Plan : OVARIAN Carboplatin (AUC 6) + Paclitaxel (175) q21d X 6 Cycles     05/02/2022 Tumor Marker   Patient's tumor was tested for the following markers: CA-125. Results of the tumor marker test revealed 193.   05/26/2022 Procedure   Technically successful right IJ power-injectable port catheter placement. Ready for routine use.     05/30/2022 Tumor Marker   Patient's tumor was tested for the following markers: CA-125. Results of the tumor marker test revealed 93.5.   07/04/2022  Genetic Testing   Negative [facility] Multi-Cancer +RNA Panel.  VUS in ALK at c.2239G>A (p.Gly747Arg).  Report date is 07/04/2022.   The Multi-Cancer + RNA Panel offered by [facility] includes sequencing and/or deletion/duplication analysis of the following 70 genes:  AIP*, ALK, APC*, ATM*, AXIN2*, BAP1*, BARD1*, BLM*, BMPR1A*, BRCA1*, BRCA2*, BRIP1*, CDC73*, CDH1*, CDK4, CDKN1B*, CDKN2A, CHEK2*, CTNNA1*, DICER1*, EPCAM (del/dup only), EGFR, FH*, FLCN*, GREM1 (promoter dup only), HOXB13, KIT, LZTR1, MAX*, MBD4, MEN1*, MET, MITF, MLH1*, MSH2*, MSH3*, MSH6*, MUTYH*, NF1*, NF2*, NTHL1*, PALB2*, PDGFRA, PMS2*, POLD1*, POLE*, POT1*,  PRKAR1A*, PTCH1*, PTEN*, RAD51C*, RAD51D*, RB1*, RET, SDHA* (sequencing only), SDHAF2*, SDHB*, SDHC*, SDHD*, SMAD4*, SMARCA4*, SMARCB1*, SMARCE1*, STK11*, SUFU*, TMEM127*, TP53*, TSC1*, TSC2*, VHL*. RNA analysis is performed for * genes.    08/04/2022 Imaging   1. Today's study demonstrates stable burden of disease. Specifically, previously noted right ovarian mass appears grossly unchanged, as does the extent of intraperitoneal metastatic disease, as detailed above. 2. Multiple small solid-appearing pulmonary nodules measuring 5 mm or less noted on the prior examination are unchanged. 3. Probable right upper lobe bronchopneumonia, as above. 4. Aortic atherosclerosis, in addition to left main and three-vessel coronary artery disease. 5. Additional incidental findings, as above.     PHYSICAL EXAMINATION: ECOG PERFORMANCE STATUS: 1 - Symptomatic but completely ambulatory  Vitals:   08/08/22 0832  BP: 132/72  Pulse: (!) 117  Resp: 18  Temp: 97.9 F (36.6 C)  SpO2: 95%   Filed Weights   08/08/22 0832  Weight: 132 lb 12.8 oz (60.2 kg)    GENERAL:alert, no distress and comfortable NEURO: alert & oriented x 3 with fluent speech, no focal motor/sensory deficits  LABORATORY DATA:  I have reviewed the data as listed    Component Value Date/Time   NA 141 08/04/2022 1032    NA 139 09/19/2015 1422   K 4.1 08/04/2022 1032   CL 105 08/04/2022 1032   CO2 30 08/04/2022 1032   GLUCOSE 118 (H) 08/04/2022 1032   BUN 16 08/04/2022 1032   BUN 20 09/19/2015 1422   CREATININE 0.49 08/04/2022 1032   CALCIUM 9.8 08/04/2022 1032   PROT 6.7 08/04/2022 1032   PROT 7.0 09/19/2015 1422   ALBUMIN 3.8 08/04/2022 1032   ALBUMIN 4.6 09/19/2015 1422   AST 13 (L) 08/04/2022 1032   ALT 12 08/04/2022 1032   ALKPHOS 92 08/04/2022 1032   BILITOT 0.3 08/04/2022 1032   GFRNONAA >60 08/04/2022 1032   GFRAA >60 04/20/2018 1452    No results found for: "SPEP", "UPEP"  Lab Results  Component Value Date   WBC 4.7 08/04/2022   NEUTROABS 2.7 08/04/2022   HGB 10.8 (L) 08/04/2022   HCT 32.9 (L) 08/04/2022   MCV 96.5 08/04/2022   PLT 188 08/04/2022      Chemistry      Component Value Date/Time   NA 141 08/04/2022 1032   NA 139 09/19/2015 1422   K 4.1 08/04/2022 1032   CL 105 08/04/2022 1032   CO2 30 08/04/2022 1032   BUN 16 08/04/2022 1032   BUN 20 09/19/2015 1422   CREATININE 0.49 08/04/2022 1032      Component Value Date/Time   CALCIUM 9.8 08/04/2022 1032   ALKPHOS 92 08/04/2022 1032   AST 13 (L) 08/04/2022 1032   ALT 12 08/04/2022 1032   BILITOT 0.3 08/04/2022 1032       RADIOGRAPHIC STUDIES: I have personally reviewed the radiological images as listed and agreed with the findings in the report. CT CHEST ABDOMEN PELVIS W CONTRAST  Result Date: 08/04/2022 CLINICAL DATA:  82 year old female with history of ovarian cancer. Follow-up study to evaluate response to therapy. * Tracking Code: BO * EXAM: CT CHEST, ABDOMEN, AND PELVIS WITH CONTRAST TECHNIQUE: Multidetector CT imaging of the chest, abdomen and pelvis was performed following the standard protocol during bolus administration of intravenous contrast. RADIATION DOSE REDUCTION: This exam was performed according to the departmental dose-optimization program which includes automated exposure control, adjustment of  the mA and/or kV according to patient size and/or use of iterative reconstruction technique.  CONTRAST:  OMNIPAQUE IOHEXOL 300 MG/ML  SOLN COMPARISON:  CT of the abdomen and pelvis 07/01/2022. Chest CT 04/18/2022. FINDINGS: CT CHEST FINDINGS Cardiovascular: Heart size is normal. There is no significant pericardial fluid, thickening or pericardial calcification. There is aortic atherosclerosis, as well as atherosclerosis of the great vessels of the mediastinum and the coronary arteries, including calcified atherosclerotic plaque in the left main, left anterior descending, left circumflex and right coronary arteries. Right internal jugular single-lumen Port-A-Cath with tip terminating at the superior cavoatrial junction. Mediastinum/Nodes: No pathologically enlarged mediastinal or hilar lymph nodes. Esophagus is unremarkable in appearance. No axillary lymphadenopathy. Lungs/Pleura: Several small solid-appearing pulmonary nodules are again noted throughout the lungs bilaterally, largest of which measures only 5 mm in the right lower lobe (axial image 104 of series 6), similar to the prior study from 04/18/2022. No larger more suspicious appearing pulmonary nodules or masses are noted. In addition, in the right upper lobe there is new clustered peribronchovascular ground-glass attenuation and micro nodularity, suggestive of an acute bronchopneumonia. No pleural effusions. Musculoskeletal: There are no aggressive appearing lytic or blastic lesions noted in the visualized portions of the skeleton. CT ABDOMEN PELVIS FINDINGS Hepatobiliary: No suspicious cystic or solid hepatic lesions. No intra or extrahepatic biliary ductal dilatation. Gallbladder is unremarkable in appearance. Pancreas: No pancreatic mass. No pancreatic ductal dilatation. No pancreatic or peripancreatic fluid collections or inflammatory changes. Spleen: Unremarkable. Adrenals/Urinary Tract: Bilateral kidneys and bilateral adrenal glands are normal  in appearance. No hydroureteronephrosis. Urinary bladder is unremarkable in appearance. Stomach/Bowel: The appearance of the stomach is normal. No pathologic dilatation of small bowel or colon. Appendix is not confidently identified, likely surgically absent. Vascular/Lymphatic: Aortic atherosclerosis, without evidence of aneurysm or dissection in the abdominal or pelvic vasculature. No definite lymphadenopathy confidently identified in the abdomen or pelvis. Reproductive: Complex mixed cystic and solid heterogeneously enhancing lesion in the right ovary (axial image 96 of series 2 and coronal image 39 of series 4) measuring 3.9 x 3.6 x 2.2 cm, compatible with the reported ovarian neoplasm, similar to the prior examination. Left ovary is not confidently identified may be surgically absent or atrophic. Status post hysterectomy. Other: Widespread peritoneal nodularity is again noted, indicative of extensive intraperitoneal metastatic disease, most evident in the region of the omentum, best appreciated on axial image 82 of series 2 where there is a confluence of omental nodularity which is grossly similar to the prior study measuring approximately 8.4 x 1.8 cm. Small soft tissue implants in the low anatomic pelvis are again noted, measuring up to 2.2 x 1.1 cm medial to the sigmoid colon (axial image 101 of series 2), similar to the prior study. Trace ascites. No pneumoperitoneum. Musculoskeletal: Status post PLIF at L4-L5 with interbody cage at L4-L5 interspace. Chronic appearing compression fracture of L1 with up to 50% loss of central vertebral body height, unchanged. Small well-defined sclerotic lesion with narrow zone of transition in the posterior aspect of the L2 vertebral body, unchanged, likely a bone island. There are no aggressive appearing lytic or blastic lesions noted in the visualized portions of the skeleton. IMPRESSION: 1. Today's study demonstrates stable burden of disease. Specifically, previously  noted right ovarian mass appears grossly unchanged, as does the extent of intraperitoneal metastatic disease, as detailed above. 2. Multiple small solid-appearing pulmonary nodules measuring 5 mm or less noted on the prior examination are unchanged. 3. Probable right upper lobe bronchopneumonia, as above. 4. Aortic atherosclerosis, in addition to left main and three-vessel coronary artery disease. 5.  Additional incidental findings, as above. Electronically Signed   By: Trudie Reed M.D.   On: 08/04/2022 08:21

## 2022-08-08 NOTE — Assessment & Plan Note (Signed)
She had significant chronic pain syndrome related to problems with her back Her pain is poorly controlled at baseline So far, she denies worsening pain or peripheral neuropathy on treatment 

## 2022-08-08 NOTE — Telephone Encounter (Signed)
Returned her call. She did not get next appt for treatment today. Told her that the scheduler would call her next week to schedule. She verbalized understanding.

## 2022-08-11 ENCOUNTER — Other Ambulatory Visit: Payer: Self-pay | Admitting: Physical Medicine & Rehabilitation

## 2022-08-11 ENCOUNTER — Other Ambulatory Visit: Payer: Self-pay | Admitting: Hematology and Oncology

## 2022-08-11 ENCOUNTER — Telehealth: Payer: Self-pay | Admitting: Hematology and Oncology

## 2022-08-11 ENCOUNTER — Encounter: Payer: Self-pay | Admitting: Psychiatry

## 2022-08-11 ENCOUNTER — Encounter: Payer: Self-pay | Admitting: Hematology and Oncology

## 2022-08-11 NOTE — Telephone Encounter (Signed)
Spoke with patient confirming upcoming appointments  

## 2022-08-11 NOTE — Telephone Encounter (Signed)
Returning call per voicemail on 6/17. Patient is aware of upcoming appointment date/times.

## 2022-08-12 ENCOUNTER — Other Ambulatory Visit: Payer: Self-pay

## 2022-08-12 DIAGNOSIS — H5213 Myopia, bilateral: Secondary | ICD-10-CM | POA: Diagnosis not present

## 2022-08-12 DIAGNOSIS — H5022 Vertical strabismus, left eye: Secondary | ICD-10-CM | POA: Diagnosis not present

## 2022-08-12 DIAGNOSIS — H52221 Regular astigmatism, right eye: Secondary | ICD-10-CM | POA: Diagnosis not present

## 2022-08-12 DIAGNOSIS — H524 Presbyopia: Secondary | ICD-10-CM | POA: Diagnosis not present

## 2022-08-12 DIAGNOSIS — C569 Malignant neoplasm of unspecified ovary: Secondary | ICD-10-CM | POA: Diagnosis not present

## 2022-08-12 DIAGNOSIS — H353131 Nonexudative age-related macular degeneration, bilateral, early dry stage: Secondary | ICD-10-CM | POA: Diagnosis not present

## 2022-08-12 DIAGNOSIS — H532 Diplopia: Secondary | ICD-10-CM | POA: Diagnosis not present

## 2022-08-12 DIAGNOSIS — H35373 Puckering of macula, bilateral: Secondary | ICD-10-CM | POA: Diagnosis not present

## 2022-08-13 ENCOUNTER — Inpatient Hospital Stay: Payer: Medicare HMO | Admitting: Licensed Clinical Social Worker

## 2022-08-13 ENCOUNTER — Encounter (HOSPITAL_COMMUNITY): Payer: Self-pay | Admitting: Psychiatry

## 2022-08-13 ENCOUNTER — Other Ambulatory Visit: Payer: Self-pay

## 2022-08-13 NOTE — Progress Notes (Signed)
[facility] CSW Counseling Note  Patient was referred by new patient protocol. Treatment type: Individual  Presenting Concerns: Patient and/or family reports the following symptoms/concerns: depression Duration of problem: since diagnosis (Jan 2024); Severity of problem: moderate   Orientation:oriented to person, place, time/date, and situation.   Affect: Appropriate and Congruent Risk of harm to self or others: No plan to harm self or others  Patient and/or Family's Strengths/Protective Factors: Ability for insight  Communication skills , supportive son     Goals Addressed: Patient will:  Reduce symptoms of: depression Increase knowledge and/or ability of: coping skills  Increase healthy adjustment to current life circumstances   Progress towards Goals: Progressing   Interventions: Interventions utilized:  CBT and Strength-based      Assessment: Patient has had some improvement in mood although still has some days where she does not want to get up, mostly if she has had difficulty sleeping the night before. Discussed behavioral activation to help combat depression and made plan for pt to at least get dressed each day. Typically pt is able to have at least one scheduled outing which she finds helpful although physically tiring.   CSW and pt spent a portion of today's session discussing what "good death" looks like and ways to help ease pain from a therapeutic aspect. Pt values caregiving for her family and making things easier for them. She has been consistent in meeting this value with planning her burial and knowing that she would want to be in a hospice home at end of life if possible. CSW and pt will continue to explore values and living life in alignment with them.    Plan: Follow up with CSW: 3 weeks Behavioral recommendations: Continue your writing. Each day, even if feeling sad, get out of bed and get dressed. Try to spend a few minutes outside of the house.  Referral(s):  Chaplain       Rotha Cassels E Kahealani Yankovich, LCSW

## 2022-08-14 ENCOUNTER — Encounter: Payer: Self-pay | Admitting: General Practice

## 2022-08-14 ENCOUNTER — Encounter (HOSPITAL_COMMUNITY): Payer: Self-pay | Admitting: Psychiatry

## 2022-08-14 NOTE — Progress Notes (Signed)
[facility] Spiritual Care Note  Left voicemail with church suggestions that might meet her theological and worship-style needs, per Ms Hefferan's request. Encouraged return call as desired and plan to follow up at treatment in August (out of town for July treatment).   [address] [place], [place], [facility] Pager [phone] Voicemail [phone]

## 2022-08-15 ENCOUNTER — Telehealth (HOSPITAL_COMMUNITY): Payer: Self-pay | Admitting: Internal Medicine

## 2022-08-15 NOTE — Telephone Encounter (Signed)
Patient cancelled Cardiac PET CT for reason below:  06/30/2022 3:51 PM [id number], JENNA B  Cancel Rsn: Patient   Order will be removed from the active PET CT WQ.

## 2022-08-19 ENCOUNTER — Other Ambulatory Visit: Payer: Self-pay | Admitting: Hematology and Oncology

## 2022-08-20 ENCOUNTER — Encounter: Payer: Self-pay | Admitting: Hematology and Oncology

## 2022-08-20 ENCOUNTER — Other Ambulatory Visit: Payer: Self-pay | Admitting: Hematology and Oncology

## 2022-08-21 ENCOUNTER — Encounter: Payer: Medicare HMO | Attending: Physical Medicine and Rehabilitation | Admitting: Registered Nurse

## 2022-08-21 ENCOUNTER — Encounter: Payer: Self-pay | Admitting: Registered Nurse

## 2022-08-21 VITALS — BP 129/87 | HR 76 | Ht 63.0 in | Wt 129.4 lb

## 2022-08-21 DIAGNOSIS — G894 Chronic pain syndrome: Secondary | ICD-10-CM | POA: Diagnosis not present

## 2022-08-21 DIAGNOSIS — M47816 Spondylosis without myelopathy or radiculopathy, lumbar region: Secondary | ICD-10-CM | POA: Diagnosis not present

## 2022-08-21 DIAGNOSIS — R269 Unspecified abnormalities of gait and mobility: Secondary | ICD-10-CM | POA: Insufficient documentation

## 2022-08-21 DIAGNOSIS — Z5181 Encounter for therapeutic drug level monitoring: Secondary | ICD-10-CM | POA: Insufficient documentation

## 2022-08-21 DIAGNOSIS — G253 Myoclonus: Secondary | ICD-10-CM | POA: Diagnosis not present

## 2022-08-21 DIAGNOSIS — Z79891 Long term (current) use of opiate analgesic: Secondary | ICD-10-CM | POA: Insufficient documentation

## 2022-08-21 MED ORDER — FENTANYL 25 MCG/HR TD PT72: 1.0000 | MEDICATED_PATCH | TRANSDERMAL | 0 refills | Status: DC

## 2022-08-21 MED ORDER — OXYCODONE-ACETAMINOPHEN 10-325 MG PO TABS: 1.0000 | ORAL_TABLET | Freq: Every day | ORAL | 0 refills | Status: DC | PRN

## 2022-08-21 NOTE — Progress Notes (Signed)
Subjective:    Patient ID: Alexandria Mclaughlin, female    DOB: October 27, 1940, 82 y.o.   MRN: [id number]  HPI: Alexandria Mclaughlin is a 82 y.o. female who returns for follow up appointment for chronic pain and medication refill. She states her pain is located in her lower back pain. He rates his pain 5. His current exercise regime is walking and performing stretching exercises.  Ms. Zinter Morphine equivalent is 135.00 MME.  She is also prescribed Clonazepam .We have discussed the black box warning of using opioids and benzodiazepines. I highlighted the dangers of using these drugs together and discussed the adverse events including respiratory suppression, overdose, cognitive impairment and importance of compliance with current regimen. We will continue to monitor and adjust as indicated.   Oral Swab was Performed today.      Pain Inventory Average Pain 5 Pain Right Now 5 My pain is intermittent, sharp, and stabbing  In the last 24 hours, has pain interfered with the following? General activity 6 Relation with others 7 Enjoyment of life 8 What TIME of day is your pain at its worst? daytime Sleep (in general) Fair  Pain is worse with: walking, sitting, and standing Pain improves with: rest, heat/ice, and medication Relief from Meds: 5  Family History  Problem Relation Age of Onset   Diabetes Mother    Heart disease Mother    Stroke Mother    Heart disease Father    Heart disease Brother    Colon cancer Neg Hx    Breast cancer Neg Hx    Ovarian cancer Neg Hx    Endometrial cancer Neg Hx    Pancreatic cancer Neg Hx    Prostate cancer Neg Hx    Social History   Socioeconomic History   Marital status: Widowed    Spouse name: Not on file   Number of children: Not on file   Years of education: 14   Highest education level: Associate degree: academic program  Occupational History   Occupation: Retired  Tobacco Use   Smoking status: Former    Packs/day: 0.25    Years: 15.00    Additional  pack years: 0.00    Total pack years: 3.75    Types: Cigarettes    Quit date: 03/15/1968    Years since quitting: 54.4   Smokeless tobacco: Never  Vaping Use   Vaping Use: Never used  Substance and Sexual Activity   Alcohol use: Not Currently    Comment: occasional glass of wine   Drug use: Yes    Frequency: 20.0 times per week    Types: Fentanyl, Hydrocodone   Sexual activity: Not Currently  Other Topics Concern   Not on file  Social History Narrative   Right handed   Drinks caffeine   Condo two story with Engineer, structural   Social Determinants of Health   Financial Resource Strain: Not on file  Food Insecurity: Not on file  Transportation Needs: Not on file  Physical Activity: Not on file  Stress: Not on file  Social Connections: Not on file   Past Surgical History:  Procedure Laterality Date   ABDOMINAL HYSTERECTOMY     Due to fibroids   BACK SURGERY  2011   lumbar Fusion   BLADDER SURGERY  1985   CESAREAN SECTION     x2   EYE SURGERY Bilateral    Cataract surgery with IOL   FOOT SURGERY Right    "just scraped the bone"   IR  IMAGING GUIDED PORT INSERTION  05/26/2022   LAPAROSCOPY N/A 04/08/2022   Procedure: LAPAROSCOPY DIAGNOSTIC WITH BIOPSIES;  Surgeon: Clide Cliff, MD;  Location: [facility];  Service: Gynecology;  Laterality: N/A;   RETINAL DETACHMENT SURGERY Right 10/2011   TRANSFORAMINAL LUMBAR INTERBODY FUSION W/ MIS 1 LEVEL Right 04/26/2018   Procedure: Right Lumbar four-five Minimally invasive Transforaminal lumbar interbody fusion;  Surgeon: Jadene Pierini, MD;  Location: [facility];  Service: Neurosurgery;  Laterality: Right;   Past Surgical History:  Procedure Laterality Date   ABDOMINAL HYSTERECTOMY     Due to fibroids   BACK SURGERY  2011   lumbar Fusion   BLADDER SURGERY  1985   CESAREAN SECTION     x2   EYE SURGERY Bilateral    Cataract surgery with IOL   FOOT SURGERY Right    "just scraped the bone"   IR IMAGING GUIDED PORT INSERTION  05/26/2022    LAPAROSCOPY N/A 04/08/2022   Procedure: LAPAROSCOPY DIAGNOSTIC WITH BIOPSIES;  Surgeon: Clide Cliff, MD;  Location: [facility];  Service: Gynecology;  Laterality: N/A;   RETINAL DETACHMENT SURGERY Right 10/2011   TRANSFORAMINAL LUMBAR INTERBODY FUSION W/ MIS 1 LEVEL Right 04/26/2018   Procedure: Right Lumbar four-five Minimally invasive Transforaminal lumbar interbody fusion;  Surgeon: Jadene Pierini, MD;  Location: [facility];  Service: Neurosurgery;  Laterality: Right;   Past Medical History:  Diagnosis Date   Abnormality of gait 02/07/2015   Acquired hallux rigidus of left foot 12/17/2020   Acquired unequal leg length on left 08/08/2011   AMS (altered mental status) 12/28/2020   Biceps tendonitis on left 08/18/2018   BPPV (benign paroxysmal positional vertigo) 02/07/2015   Bunion 12/17/2020   Cancer (HCC) 04/25/2022   Cerebrovascular disease    Cervical facet syndrome    Coronary artery disease 04/27/2022   Disorders of sacrum    Enthesopathy of hip region    Essential hypertension 02/07/2015   Generalized anxiety disorder    GERD (gastroesophageal reflux disease)    Headache    History of kidney stones    HLD (hyperlipidemia) 02/07/2015   Low back pain 02/07/2015   Lumbar facet arthropathy 07/05/2014   Lumbar post-laminectomy syndrome 06/11/2011   Lumbar radicular pain 07/29/2016   Lumbar radiculopathy 04/26/2018   Major depressive disorder    Mild cognitive impairment of uncertain or unknown etiology 01/24/2022   Multiple lacunar infarcts    MRI - bilateral basal ganglia and left thalamus   Myoclonus 09/19/2015   Rotator cuff tendonitis, left 06/08/2018   Sacroiliac joint dysfunction 06/11/2011   Sciatic neuritis    Stroke (HCC)    hx TIA   Subcortical infarction    2016 MRI - small chronic subcortical infarct with associated chronic hemosiderin deposition within the right superior frontal gyrus.   Synovitis and tenosynovitis    Therapeutic opioid induced  constipation 07/25/2015   Thyroid disease    Transient left leg weakness    Vision abnormalities    BP 129/87   Pulse 76   Ht 5\' 3"  (1.6 m)   Wt 129 lb 6.4 oz (58.7 kg)   SpO2 96%   BMI 22.92 kg/m   Opioid Risk Score:   Fall Risk Score:  `1  Depression screen PHQ 2/9     08/21/2022    3:01 PM 04/24/2022    1:11 PM 02/25/2022    1:02 PM 08/28/2021    2:31 PM 06/19/2021    2:53 PM 02/12/2021    2:41 PM 12/13/2020  1:55 PM  Depression screen PHQ 2/9  Decreased Interest 1 1 0 0 1 0 1  Down, Depressed, Hopeless 1 1 0 0 1 0 1  PHQ - 2 Score 2 2 0 0 2 0 2     Review of Systems  Constitutional: Negative.   HENT: Negative.    Eyes: Negative.   Respiratory: Negative.    Cardiovascular: Negative.   Gastrointestinal: Negative.   Endocrine: Negative.   Genitourinary: Negative.   Musculoskeletal:  Positive for arthralgias, back pain and myalgias.  Skin: Negative.   Allergic/Immunologic: Negative.   Neurological: Negative.   Hematological: Negative.   Psychiatric/Behavioral: Negative.    All other systems reviewed and are negative.      Objective:   Physical Exam Vitals and nursing note reviewed.  Constitutional:      Appearance: Normal appearance.  Cardiovascular:     Rate and Rhythm: Normal rate and regular rhythm.     Pulses: Normal pulses.     Heart sounds: Normal heart sounds.  Pulmonary:     Effort: Pulmonary effort is normal.     Breath sounds: Normal breath sounds.  Musculoskeletal:     Cervical back: Normal range of motion and neck supple.     Comments: Normal Muscle Bulk and Muscle Testing Reveals:  Upper Extremities: Decreased ROM 45 Degrees and Muscle Strength 5/5  Lumbar Paraspinal Tenderness: L-4-L-5 Lower Extremities: Full ROM and Muscle Strength 5/5 Arises from Table slowly using cane for support Narrow Based  Gait     Skin:    General: Skin is warm and dry.  Neurological:     Mental Status: She is alert and oriented to person, place, and time.   Psychiatric:        Mood and Affect: Mood normal.        Behavior: Behavior normal.      Assessment and Plan    1. Facet Arthropathy/ Sacroiliac Joint Dysfunction: 08/21/2022. Refilled: Fentanyl 25 mcg patch  # 10- one patch every 3 days and   Percocet 10/325 mg one tablet 5 times a day as needed for pain #130 Second script  given for the following month.  Continue to Monitor. . We will continue the opioid monitoring program, this consists of regular clinic visits, examinations, urine drug screen, pill counts as well as use of [place] Controlled Substance Reporting system. A 12 month History has been reviewed on the [place] Controlled Substance Reporting System on 08/21/2022.  2. Lumbar Radicular  Pain: Continue HEP as Tolerated. Continue to Monitor. Continue Gabapentin and Cymbalta. 08/21/2022 3. Gait Disorder: Continue HEP / Neurology Following. 06/ 27/2024 4. Myoclonus with sleep: Continue Klonopin. Continue current medication regimen. Continue to Monitor. 08/21/2022 5. Memory Changes:  Neurology:Following: No complaints today.  Continue to Monitor. 08/21/2022 6. Right Shoulder Tendinitis: No complaints today.  Continue to Monitor. 08/21/2022  7. Malignant Neoplasm: Oncology Following. Continue to Monitor. 08/21/2022   F/U in 2 months

## 2022-08-22 ENCOUNTER — Other Ambulatory Visit: Payer: Self-pay | Admitting: Physical Medicine and Rehabilitation

## 2022-08-22 DIAGNOSIS — E041 Nontoxic single thyroid nodule: Secondary | ICD-10-CM | POA: Diagnosis not present

## 2022-08-22 DIAGNOSIS — E0590 Thyrotoxicosis, unspecified without thyrotoxic crisis or storm: Secondary | ICD-10-CM | POA: Diagnosis not present

## 2022-08-22 DIAGNOSIS — G253 Myoclonus: Secondary | ICD-10-CM

## 2022-08-23 ENCOUNTER — Other Ambulatory Visit: Payer: Self-pay

## 2022-08-27 MED FILL — Fosaprepitant Dimeglumine For IV Infusion 150 MG (Base Eq): INTRAVENOUS | Qty: 5 | Status: AC

## 2022-08-27 MED FILL — Dexamethasone Sodium Phosphate Inj 100 MG/10ML: INTRAMUSCULAR | Qty: 1 | Status: AC

## 2022-08-28 LAB — DRUG TOX MONITOR 1 W/CONF, ORAL FLD
Amphetamines: NEGATIVE ng/mL (ref <10)
Barbiturates: NEGATIVE ng/mL (ref <10)
Benzodiazepines: NEGATIVE ng/mL (ref <0.50)
Buprenorphine: NEGATIVE ng/mL (ref <0.10)
Cocaine: NEGATIVE ng/mL (ref <5.0)
Codeine: NEGATIVE ng/mL (ref <2.5)
Dihydrocodeine: NEGATIVE ng/mL (ref <2.5)
Fentanyl: 0.69 ng/mL — ABNORMAL HIGH (ref <0.10)
Fentanyl: POSITIVE ng/mL — AB (ref <0.10)
Heroin Metabolite: NEGATIVE ng/mL (ref <1.0)
Hydrocodone: NEGATIVE ng/mL (ref <2.5)
Hydromorphone: NEGATIVE ng/mL (ref <2.5)
MARIJUANA: NEGATIVE ng/mL (ref <2.5)
MDMA: NEGATIVE ng/mL (ref <10)
Meprobamate: NEGATIVE ng/mL (ref <2.5)
Methadone: NEGATIVE ng/mL (ref <5.0)
Morphine: NEGATIVE ng/mL (ref <2.5)
Nicotine Metabolite: NEGATIVE ng/mL (ref <5.0)
Norhydrocodone: NEGATIVE ng/mL (ref <2.5)
Noroxycodone: 17 ng/mL — ABNORMAL HIGH (ref <2.5)
Opiates: POSITIVE ng/mL — AB (ref <2.5)
Oxycodone: 28.1 ng/mL — ABNORMAL HIGH (ref <2.5)
Oxymorphone: NEGATIVE ng/mL (ref <2.5)
Phencyclidine: NEGATIVE ng/mL (ref <10)
Tapentadol: NEGATIVE ng/mL (ref <5.0)
Tramadol: NEGATIVE ng/mL (ref <5.0)
Zolpidem: NEGATIVE ng/mL (ref <5.0)

## 2022-08-28 LAB — DRUG TOX ALC METAB W/CON, ORAL FLD: Alcohol Metabolite: NEGATIVE ng/mL (ref <25)

## 2022-08-29 ENCOUNTER — Inpatient Hospital Stay: Payer: Medicare HMO

## 2022-08-29 ENCOUNTER — Other Ambulatory Visit: Payer: Self-pay

## 2022-08-29 ENCOUNTER — Inpatient Hospital Stay (HOSPITAL_BASED_OUTPATIENT_CLINIC_OR_DEPARTMENT_OTHER): Payer: Medicare HMO | Admitting: Hematology and Oncology

## 2022-08-29 ENCOUNTER — Encounter: Payer: Self-pay | Admitting: Hematology and Oncology

## 2022-08-29 ENCOUNTER — Inpatient Hospital Stay: Payer: Medicare HMO | Attending: Psychiatry

## 2022-08-29 VITALS — BP 124/77 | HR 79 | Temp 98.0°F | Resp 15 | Wt 132.8 lb

## 2022-08-29 DIAGNOSIS — C569 Malignant neoplasm of unspecified ovary: Secondary | ICD-10-CM | POA: Diagnosis not present

## 2022-08-29 DIAGNOSIS — R918 Other nonspecific abnormal finding of lung field: Secondary | ICD-10-CM | POA: Diagnosis not present

## 2022-08-29 DIAGNOSIS — T451X5A Adverse effect of antineoplastic and immunosuppressive drugs, initial encounter: Secondary | ICD-10-CM | POA: Diagnosis not present

## 2022-08-29 DIAGNOSIS — M4856XA Collapsed vertebra, not elsewhere classified, lumbar region, initial encounter for fracture: Secondary | ICD-10-CM | POA: Insufficient documentation

## 2022-08-29 DIAGNOSIS — D6481 Anemia due to antineoplastic chemotherapy: Secondary | ICD-10-CM | POA: Insufficient documentation

## 2022-08-29 DIAGNOSIS — I2510 Atherosclerotic heart disease of native coronary artery without angina pectoris: Secondary | ICD-10-CM | POA: Diagnosis not present

## 2022-08-29 DIAGNOSIS — C786 Secondary malignant neoplasm of retroperitoneum and peritoneum: Secondary | ICD-10-CM | POA: Diagnosis not present

## 2022-08-29 DIAGNOSIS — Z79899 Other long term (current) drug therapy: Secondary | ICD-10-CM | POA: Insufficient documentation

## 2022-08-29 DIAGNOSIS — Z5111 Encounter for antineoplastic chemotherapy: Secondary | ICD-10-CM | POA: Diagnosis not present

## 2022-08-29 DIAGNOSIS — I700 Atherosclerosis of aorta: Secondary | ICD-10-CM | POA: Diagnosis not present

## 2022-08-29 LAB — CBC WITH DIFFERENTIAL (CANCER CENTER ONLY)
Abs Immature Granulocytes: 0.02 10*3/uL (ref 0.00–0.07)
Basophils Absolute: 0 10*3/uL (ref 0.0–0.1)
Basophils Relative: 0 %
Eosinophils Absolute: 0 10*3/uL (ref 0.0–0.5)
Eosinophils Relative: 0 %
HCT: 35.8 % — ABNORMAL LOW (ref 36.0–46.0)
Hemoglobin: 11.6 g/dL — ABNORMAL LOW (ref 12.0–15.0)
Immature Granulocytes: 0 %
Lymphocytes Relative: 14 %
Lymphs Abs: 0.8 10*3/uL (ref 0.7–4.0)
MCH: 31.4 pg (ref 26.0–34.0)
MCHC: 32.4 g/dL (ref 30.0–36.0)
MCV: 96.8 fL (ref 80.0–100.0)
Monocytes Absolute: 0.1 10*3/uL (ref 0.1–1.0)
Monocytes Relative: 1 %
Neutro Abs: 4.7 10*3/uL (ref 1.7–7.7)
Neutrophils Relative %: 85 %
Platelet Count: 178 10*3/uL (ref 150–400)
RBC: 3.7 MIL/uL — ABNORMAL LOW (ref 3.87–5.11)
RDW: 14.4 % (ref 11.5–15.5)
WBC Count: 5.6 10*3/uL (ref 4.0–10.5)
nRBC: 0 % (ref 0.0–0.2)

## 2022-08-29 LAB — CMP (CANCER CENTER ONLY)
ALT: 11 U/L (ref 0–44)
AST: 15 U/L (ref 15–41)
Albumin: 3.8 g/dL (ref 3.5–5.0)
Alkaline Phosphatase: 98 U/L (ref 38–126)
Anion gap: 8 (ref 5–15)
BUN: 21 mg/dL (ref 8–23)
CO2: 28 mmol/L (ref 22–32)
Calcium: 9.9 mg/dL (ref 8.9–10.3)
Chloride: 105 mmol/L (ref 98–111)
Creatinine: 0.53 mg/dL (ref 0.44–1.00)
GFR, Estimated: >60 mL/min (ref >60)
Glucose, Bld: 146 mg/dL — ABNORMAL HIGH (ref 70–99)
Potassium: 4.2 mmol/L (ref 3.5–5.1)
Sodium: 141 mmol/L (ref 135–145)
Total Bilirubin: 0.5 mg/dL (ref 0.3–1.2)
Total Protein: 6.1 g/dL — ABNORMAL LOW (ref 6.5–8.1)

## 2022-08-29 MED ORDER — HEPARIN SOD (PORK) LOCK FLUSH 100 UNIT/ML IV SOLN
500.0000 [IU] | Freq: Once | INTRAVENOUS | Status: AC | PRN
  Administered 2022-08-29: 500 [IU]

## 2022-08-29 MED ORDER — SODIUM CHLORIDE 0.9 % IV SOLN
150.0000 mg | Freq: Once | INTRAVENOUS | Status: AC
  Administered 2022-08-29: 150 mg via INTRAVENOUS
  Filled 2022-08-29: qty 150

## 2022-08-29 MED ORDER — SODIUM CHLORIDE 0.9 % IV SOLN: Freq: Once | INTRAVENOUS | Status: AC

## 2022-08-29 MED ORDER — SODIUM CHLORIDE 0.9 % IV SOLN
10.0000 mg | Freq: Once | INTRAVENOUS | Status: AC
  Administered 2022-08-29: 10 mg via INTRAVENOUS
  Filled 2022-08-29: qty 10

## 2022-08-29 MED ORDER — FAMOTIDINE IN NACL 20-0.9 MG/50ML-% IV SOLN
20.0000 mg | Freq: Once | INTRAVENOUS | Status: AC
  Administered 2022-08-29: 20 mg via INTRAVENOUS
  Filled 2022-08-29: qty 50

## 2022-08-29 MED ORDER — SODIUM CHLORIDE 0.9% FLUSH
10.0000 mL | INTRAVENOUS | Status: DC | PRN
  Administered 2022-08-29: 10 mL

## 2022-08-29 MED ORDER — PALONOSETRON HCL INJECTION 0.25 MG/5ML
0.2500 mg | Freq: Once | INTRAVENOUS | Status: AC
  Administered 2022-08-29: 0.25 mg via INTRAVENOUS
  Filled 2022-08-29: qty 5

## 2022-08-29 MED ORDER — SODIUM CHLORIDE 0.9 % IV SOLN
332.0000 mg | Freq: Once | INTRAVENOUS | Status: AC
  Administered 2022-08-29: 330 mg via INTRAVENOUS
  Filled 2022-08-29: qty 33

## 2022-08-29 MED ORDER — SODIUM CHLORIDE 0.9 % IV SOLN
105.0000 mg/m2 | Freq: Once | INTRAVENOUS | Status: AC
  Administered 2022-08-29: 174 mg via INTRAVENOUS
  Filled 2022-08-29: qty 29

## 2022-08-29 MED ORDER — CETIRIZINE HCL 10 MG/ML IV SOLN
10.0000 mg | Freq: Once | INTRAVENOUS | Status: AC
  Administered 2022-08-29: 10 mg via INTRAVENOUS
  Filled 2022-08-29: qty 1

## 2022-08-29 MED ORDER — SODIUM CHLORIDE 0.9% FLUSH
10.0000 mL | Freq: Once | INTRAVENOUS | Status: AC
  Administered 2022-08-29: 10 mL

## 2022-08-29 NOTE — Assessment & Plan Note (Signed)
Overall, she has positive response to therapy The plan will be to continue chemotherapy for few more cycles with plan to repeat imaging study again in August

## 2022-08-29 NOTE — Assessment & Plan Note (Signed)

## 2022-08-29 NOTE — Patient Instructions (Signed)
[facility]  Discharge Instructions: Thank you for choosing [facility] to provide your oncology and hematology care.   If you have a lab appointment with the Cancer Center, please go directly to the Cancer Center and check in at the registration area.   Wear comfortable clothing and clothing appropriate for easy access to any Portacath or PICC line.   We strive to give you quality time with your provider. You may need to reschedule your appointment if you arrive late (15 or more minutes).  Arriving late affects you and other patients whose appointments are after yours.  Also, if you miss three or more appointments without notifying the office, you may be dismissed from the clinic at the provider's discretion.      For prescription refill requests, have your pharmacy contact our office and allow 72 hours for refills to be completed.    Today you received the following chemotherapy and/or immunotherapy agents: Paclitaxel, Carboplatin.       To help prevent nausea and vomiting after your treatment, we encourage you to take your nausea medication as directed.  BELOW ARE SYMPTOMS THAT SHOULD BE REPORTED IMMEDIATELY: *FEVER GREATER THAN 100.4 F (38 C) OR HIGHER *CHILLS OR SWEATING *NAUSEA AND VOMITING THAT IS NOT CONTROLLED WITH YOUR NAUSEA MEDICATION *UNUSUAL SHORTNESS OF BREATH *UNUSUAL BRUISING OR BLEEDING *URINARY PROBLEMS (pain or burning when urinating, or frequent urination) *BOWEL PROBLEMS (unusual diarrhea, constipation, pain near the anus) TENDERNESS IN MOUTH AND THROAT WITH OR WITHOUT PRESENCE OF ULCERS (sore throat, sores in mouth, or a toothache) UNUSUAL RASH, SWELLING OR PAIN  UNUSUAL VAGINAL DISCHARGE OR ITCHING   Items with * indicate a potential emergency and should be followed up as soon as possible or go to the Emergency Department if any problems should occur.  Please show the CHEMOTHERAPY ALERT CARD or IMMUNOTHERAPY  ALERT CARD at check-in to the Emergency Department and triage nurse.  Should you have questions after your visit or need to cancel or reschedule your appointment, please contact  [facility]  Dept: [phone]  and follow the prompts.  Office hours are 8:00 a.m. to 4:30 p.m. Monday - Friday. Please note that voicemails left after 4:00 p.m. may not be returned until the following business day.  We are closed weekends and major holidays. You have access to a nurse at all times for urgent questions. Please call the main number to the clinic Dept: [phone] and follow the prompts.   For any non-urgent questions, you may also contact your provider using MyChart. We now offer e-Visits for anyone 18 and older to request care online for non-urgent symptoms. For details visit [email or web address].   Also download the MyChart app! Go to the app store, search "MyChart", open the app, select , and log in with your MyChart username and password.   

## 2022-08-29 NOTE — Progress Notes (Signed)
[facility] OFFICE PROGRESS NOTE  Patient Care Team: Georgianne Fick, MD as PCP - General (Internal Medicine) Parke Poisson, MD as PCP - Cardiology (Cardiology) Drema Dallas, DO as Consulting Physician (Neurology)  ASSESSMENT & PLAN:  Ovarian cancer [facility]) Overall, she has positive response to therapy The plan will be to continue chemotherapy for few more cycles with plan to repeat imaging study again in August   Anemia due to antineoplastic chemotherapy This is likely due to recent treatment. The patient denies recent history of bleeding such as epistaxis, hematuria or hematochezia. She is asymptomatic from the anemia. I will observe for now.  She does not require transfusion now. I will continue the chemotherapy at current dose without dosage adjustment.  If the anemia gets progressive worse in the future, I might have to delay her treatment or adjust the chemotherapy dose.   No orders of the defined types were placed in this encounter.   All questions were answered. The patient knows to call the clinic with any problems, questions or concerns. The total time spent in the appointment was 25 minutes encounter with patients including review of chart and various tests results, discussions about plan of care and coordination of care plan   Artis Delay, MD 08/29/2022 3:36 PM  INTERVAL HISTORY: Please see below for problem oriented charting. she returns for treatment follow-up seen prior to chemotherapy She denies peripheral neuropathy No nausea or constipation  REVIEW OF SYSTEMS:   Constitutional: Denies fevers, chills or abnormal weight loss Eyes: Denies blurriness of vision Ears, nose, mouth, throat, and face: Denies mucositis or sore throat Respiratory: Denies cough, dyspnea or wheezes Cardiovascular: Denies palpitation, chest discomfort or lower extremity swelling Gastrointestinal:  Denies nausea, heartburn or change in bowel habits Skin: Denies abnormal  skin rashes Lymphatics: Denies new lymphadenopathy or easy bruising Neurological:Denies numbness, tingling or new weaknesses Behavioral/Psych: Mood is stable, no new changes  All other systems were reviewed with the patient and are negative.  I have reviewed the past medical history, past surgical history, social history and family history with the patient and they are unchanged from previous note.  ALLERGIES:  has No Known Allergies.  MEDICATIONS:  Current Outpatient Medications  Medication Sig Dispense Refill   aspirin-acetaminophen-caffeine (EXCEDRIN MIGRAINE) 250-250-65 MG tablet Take 1 tablet by mouth every 6 (six) hours as needed for headache or migraine.     Cholecalciferol (VITAMIN D) 50 MCG (2000 UT) CAPS Take 2,000 Units by mouth daily.     clonazePAM (KLONOPIN) 0.5 MG tablet TAKE 1 TABLET BY MOUTH AT BEDTIME AS NEEDED FOR ANXIETY 30 tablet 2   cyanocobalamin 1000 MCG tablet 1,000 mcg daily.     dexamethasone (DECADRON) 4 MG tablet Take 2 tabs at the night before and 2 tab the morning of chemotherapy, every 3 weeks, by mouth x 6 cycles 24 tablet 6   DULoxetine (CYMBALTA) 60 MG capsule TAKE 1 CAPSULE BY MOUTH EVERY DAY 90 capsule 3   fentaNYL (DURAGESIC) 25 MCG/HR Place 1 patch onto the skin every 3 (three) days. 10 patch 0   gabapentin (NEURONTIN) 600 MG tablet TAKE 1 TABLET BY MOUTH FOUR TIMES A DAY 360 tablet 1   irbesartan (AVAPRO) 150 MG tablet Take 150 mg by mouth daily.     lactulose (CHRONULAC) 10 GM/15ML solution TAKE 15 MLS (10 G TOTAL) BY MOUTH 3 (THREE) TIMES DAILY. 473 mL 1   magnesium oxide (MAG-OX) 400 (240 Mg) MG tablet TAKE 1 TABLET BY MOUTH EVERY  DAY 30 tablet 1   Melatonin 10 MG TABS Take 10 mg by mouth at bedtime as needed (sleep).     methimazole (TAPAZOLE) 5 MG tablet Take 5 mg by mouth 2 (two) times a week.     ondansetron (ZOFRAN) 8 MG tablet Take 1 tablet (8 mg total) by mouth every 8 (eight) hours as needed for nausea or vomiting. Start on the third day  after carboplatin. 30 tablet 1   oxyCODONE-acetaminophen (PERCOCET) 10-325 MG tablet Take 1 tablet by mouth 5 (five) times daily as needed for pain. 130 tablet 0   polyethylene glycol (MIRALAX / GLYCOLAX) 17 g packet Take 17 g by mouth 2 (two) times daily.     prochlorperazine (COMPAZINE) 10 MG tablet Take 1 tablet (10 mg total) by mouth every 6 (six) hours as needed for nausea or vomiting. 30 tablet 1   rosuvastatin (CRESTOR) 40 MG tablet Take 1 tablet (40 mg total) by mouth at bedtime. 90 tablet 3   senna (SENOKOT) 8.6 MG tablet Take 2 tablets by mouth 2 (two) times daily.     traZODone (DESYREL) 100 MG tablet TAKE 1 TABLET BY MOUTH EVERYDAY AT BEDTIME 90 tablet 2   No current facility-administered medications for this visit.   Facility-Administered Medications Ordered in Other Visits  Medication Dose Route Frequency Provider Last Rate Last Admin   sodium chloride flush (NS) 0.9 % injection 10 mL  10 mL Intracatheter PRN Bertis Ruddy, Eyvonne Burchfield, MD   10 mL at 08/29/22 1531    SUMMARY OF ONCOLOGIC HISTORY: Oncology History Overview Note  High grade serous   Ovarian cancer (HCC)  03/17/2022 Imaging   CT Abdomen/Pelvis: IMPRESSION: 1. Heterogeneous right adnexal mass measuring 3.8 x 3.0 cm with associated soft tissue thickening along the peritoneal reflections and greater omentum. Findings are highly suspicious for ovarian neoplasm with peritoneal carcinomatosis. 2. No evidence of bowel obstruction. 3. Coronary artery calcifications.   03/18/2022 Tumor Marker   CA125: 163   04/08/2022 Surgery   Diagnostic laparoscopy with peritoneal and omental biopsies  Findings: On entry to abdomen, omentum and large bowel broadly and densely adherent to the anterior abdominal wall in the midline from the level of the falciform ligament superiorly to the bladder inferiorly. Evidence of peritoneal plaques consistent with peritoneal carcinomatosis on the left diaphragm and left anterior abdominal wall and pelvic  peritoneum. Right diaphragm and liver limited in visualization but no evidence of disease. Left liver lobe and stomach normal. Right abdomen not visualized due to the midline adhesions. Pelvic mass not visualized due to adhesions. Omentum adherent to the anterior abdominal wall consistent with omental cake. Small bowel and mesentery that is able to be visualized does not appear to have disease. Initial peritoneal biopsy indeterminate for carcinoma on frozen. Additional biopsies obtained.    04/08/2022 Initial Diagnosis   Ovarian cancer (HCC)   04/08/2022 Pathology Results   A. PERITONEUM, BIOPSY:  Microscopic focus of adenocarcinoma.  See comment.   B. PERITONEUM, #2, BIOPSY:  Microscopic focus of adenocarcinoma.   C. OMENTUM, BIOPSY:  Adenocarcinoma.  See comment.   COMMENT:  A. The permanent recuts of the peritoneal biopsy submitted for frozen  section show a microscopic focus of adenocarcinoma which is not  identified in the frozen section slides.   C.  The omental specimen shows adenocarcinoma and immunohistochemistry  will be performed and reported as an addendum.  The morphologic features  favor an ovarian primary.   ADDENDUM:  Immunohistochemistry shows the carcinoma is positive with  PAX8 and p16 with patchy positive staining with WT1 and estrogen receptor.  Progesterone receptor is negative.  P53 shows diffuse strong positivity (mutated pattern).  The morphology and immunophenotype are consistent with high-grade serous carcinoma.    04/08/2022 Pathology Results   CYTOLOGY FINAL MICROSCOPIC DIAGNOSIS:  - Malignant cells consistent with adenocarcinoma  - See comment    04/17/2022 Cancer Staging   Staging form: Ovary, Fallopian Tube, and Primary Peritoneal Carcinoma, AJCC 8th Edition - Clinical: FIGO Stage IIIC (cT3c, cN0, cM0) - Signed by Artis Delay, MD on 04/17/2022 Stage prefix: Initial diagnosis   04/30/2022 -  Chemotherapy   Patient is on Treatment Plan : OVARIAN  Carboplatin (AUC 6) + Paclitaxel (175) q21d X 6 Cycles     05/02/2022 Tumor Marker   Patient's tumor was tested for the following markers: CA-125. Results of the tumor marker test revealed 193.   05/26/2022 Procedure   Technically successful right IJ power-injectable port catheter placement. Ready for routine use.     05/30/2022 Tumor Marker   Patient's tumor was tested for the following markers: CA-125. Results of the tumor marker test revealed 93.5.   07/04/2022 Genetic Testing   Negative [facility] Multi-Cancer +RNA Panel.  VUS in ALK at c.2239G>A (p.Gly747Arg).  Report date is 07/04/2022.   The Multi-Cancer + RNA Panel offered by [facility] includes sequencing and/or deletion/duplication analysis of the following 70 genes:  AIP*, ALK, APC*, ATM*, AXIN2*, BAP1*, BARD1*, BLM*, BMPR1A*, BRCA1*, BRCA2*, BRIP1*, CDC73*, CDH1*, CDK4, CDKN1B*, CDKN2A, CHEK2*, CTNNA1*, DICER1*, EPCAM (del/dup only), EGFR, FH*, FLCN*, GREM1 (promoter dup only), HOXB13, KIT, LZTR1, MAX*, MBD4, MEN1*, MET, MITF, MLH1*, MSH2*, MSH3*, MSH6*, MUTYH*, NF1*, NF2*, NTHL1*, PALB2*, PDGFRA, PMS2*, POLD1*, POLE*, POT1*, PRKAR1A*, PTCH1*, PTEN*, RAD51C*, RAD51D*, RB1*, RET, SDHA* (sequencing only), SDHAF2*, SDHB*, SDHC*, SDHD*, SMAD4*, SMARCA4*, SMARCB1*, SMARCE1*, STK11*, SUFU*, TMEM127*, TP53*, TSC1*, TSC2*, VHL*. RNA analysis is performed for * genes.    08/04/2022 Imaging   1. Today's study demonstrates stable burden of disease. Specifically, previously noted right ovarian mass appears grossly unchanged, as does the extent of intraperitoneal metastatic disease, as detailed above. 2. Multiple small solid-appearing pulmonary nodules measuring 5 mm or less noted on the prior examination are unchanged. 3. Probable right upper lobe bronchopneumonia, as above. 4. Aortic atherosclerosis, in addition to left main and three-vessel coronary artery disease. 5. Additional incidental findings, as above.     PHYSICAL EXAMINATION: ECOG  PERFORMANCE STATUS: 1 - Symptomatic but completely ambulatory  Vitals:   08/29/22 0929  BP: 124/77  Pulse: 79  Resp: 15  Temp: 98 F (36.7 C)  SpO2: 93%   Filed Weights   08/29/22 0929  Weight: 132 lb 12.8 oz (60.2 kg)    GENERAL:alert, no distress and comfortable NEURO: alert & oriented x 3 with fluent speech, no focal motor/sensory deficits  LABORATORY DATA:  I have reviewed the data as listed    Component Value Date/Time   NA 141 08/29/2022 0855   NA 139 09/19/2015 1422   K 4.2 08/29/2022 0855   CL 105 08/29/2022 0855   CO2 28 08/29/2022 0855   GLUCOSE 146 (H) 08/29/2022 0855   BUN 21 08/29/2022 0855   BUN 20 09/19/2015 1422   CREATININE 0.53 08/29/2022 0855   CALCIUM 9.9 08/29/2022 0855   PROT 6.1 (L) 08/29/2022 0855   PROT 7.0 09/19/2015 1422   ALBUMIN 3.8 08/29/2022 0855   ALBUMIN 4.6 09/19/2015 1422   AST 15 08/29/2022 0855   ALT 11 08/29/2022 0855   ALKPHOS 98  08/29/2022 0855   BILITOT 0.5 08/29/2022 0855   GFRNONAA >60 08/29/2022 0855   GFRAA >60 04/20/2018 1452    No results found for: "SPEP", "UPEP"  Lab Results  Component Value Date   WBC 5.6 08/29/2022   NEUTROABS 4.7 08/29/2022   HGB 11.6 (L) 08/29/2022   HCT 35.8 (L) 08/29/2022   MCV 96.8 08/29/2022   PLT 178 08/29/2022      Chemistry      Component Value Date/Time   NA 141 08/29/2022 0855   NA 139 09/19/2015 1422   K 4.2 08/29/2022 0855   CL 105 08/29/2022 0855   CO2 28 08/29/2022 0855   BUN 21 08/29/2022 0855   BUN 20 09/19/2015 1422   CREATININE 0.53 08/29/2022 0855      Component Value Date/Time   CALCIUM 9.9 08/29/2022 0855   ALKPHOS 98 08/29/2022 0855   AST 15 08/29/2022 0855   ALT 11 08/29/2022 0855   BILITOT 0.5 08/29/2022 0855       RADIOGRAPHIC STUDIES: I have personally reviewed the radiological images as listed and agreed with the findings in the report. CT CHEST ABDOMEN PELVIS W CONTRAST  Result Date: 08/04/2022 CLINICAL DATA:  82 year old female with  history of ovarian cancer. Follow-up study to evaluate response to therapy. * Tracking Code: BO * EXAM: CT CHEST, ABDOMEN, AND PELVIS WITH CONTRAST TECHNIQUE: Multidetector CT imaging of the chest, abdomen and pelvis was performed following the standard protocol during bolus administration of intravenous contrast. RADIATION DOSE REDUCTION: This exam was performed according to the departmental dose-optimization program which includes automated exposure control, adjustment of the mA and/or kV according to patient size and/or use of iterative reconstruction technique. CONTRAST:  OMNIPAQUE IOHEXOL 300 MG/ML  SOLN COMPARISON:  CT of the abdomen and pelvis 07/01/2022. Chest CT 04/18/2022. FINDINGS: CT CHEST FINDINGS Cardiovascular: Heart size is normal. There is no significant pericardial fluid, thickening or pericardial calcification. There is aortic atherosclerosis, as well as atherosclerosis of the great vessels of the mediastinum and the coronary arteries, including calcified atherosclerotic plaque in the left main, left anterior descending, left circumflex and right coronary arteries. Right internal jugular single-lumen Port-A-Cath with tip terminating at the superior cavoatrial junction. Mediastinum/Nodes: No pathologically enlarged mediastinal or hilar lymph nodes. Esophagus is unremarkable in appearance. No axillary lymphadenopathy. Lungs/Pleura: Several small solid-appearing pulmonary nodules are again noted throughout the lungs bilaterally, largest of which measures only 5 mm in the right lower lobe (axial image 104 of series 6), similar to the prior study from 04/18/2022. No larger more suspicious appearing pulmonary nodules or masses are noted. In addition, in the right upper lobe there is new clustered peribronchovascular ground-glass attenuation and micro nodularity, suggestive of an acute bronchopneumonia. No pleural effusions. Musculoskeletal: There are no aggressive appearing lytic or blastic  lesions noted in the visualized portions of the skeleton. CT ABDOMEN PELVIS FINDINGS Hepatobiliary: No suspicious cystic or solid hepatic lesions. No intra or extrahepatic biliary ductal dilatation. Gallbladder is unremarkable in appearance. Pancreas: No pancreatic mass. No pancreatic ductal dilatation. No pancreatic or peripancreatic fluid collections or inflammatory changes. Spleen: Unremarkable. Adrenals/Urinary Tract: Bilateral kidneys and bilateral adrenal glands are normal in appearance. No hydroureteronephrosis. Urinary bladder is unremarkable in appearance. Stomach/Bowel: The appearance of the stomach is normal. No pathologic dilatation of small bowel or colon. Appendix is not confidently identified, likely surgically absent. Vascular/Lymphatic: Aortic atherosclerosis, without evidence of aneurysm or dissection in the abdominal or pelvic vasculature. No definite lymphadenopathy confidently identified in the abdomen or  pelvis. Reproductive: Complex mixed cystic and solid heterogeneously enhancing lesion in the right ovary (axial image 96 of series 2 and coronal image 39 of series 4) measuring 3.9 x 3.6 x 2.2 cm, compatible with the reported ovarian neoplasm, similar to the prior examination. Left ovary is not confidently identified may be surgically absent or atrophic. Status post hysterectomy. Other: Widespread peritoneal nodularity is again noted, indicative of extensive intraperitoneal metastatic disease, most evident in the region of the omentum, best appreciated on axial image 82 of series 2 where there is a confluence of omental nodularity which is grossly similar to the prior study measuring approximately 8.4 x 1.8 cm. Small soft tissue implants in the low anatomic pelvis are again noted, measuring up to 2.2 x 1.1 cm medial to the sigmoid colon (axial image 101 of series 2), similar to the prior study. Trace ascites. No pneumoperitoneum. Musculoskeletal: Status post PLIF at L4-L5 with interbody cage at  L4-L5 interspace. Chronic appearing compression fracture of L1 with up to 50% loss of central vertebral body height, unchanged. Small well-defined sclerotic lesion with narrow zone of transition in the posterior aspect of the L2 vertebral body, unchanged, likely a bone island. There are no aggressive appearing lytic or blastic lesions noted in the visualized portions of the skeleton. IMPRESSION: 1. Today's study demonstrates stable burden of disease. Specifically, previously noted right ovarian mass appears grossly unchanged, as does the extent of intraperitoneal metastatic disease, as detailed above. 2. Multiple small solid-appearing pulmonary nodules measuring 5 mm or less noted on the prior examination are unchanged. 3. Probable right upper lobe bronchopneumonia, as above. 4. Aortic atherosclerosis, in addition to left main and three-vessel coronary artery disease. 5. Additional incidental findings, as above. Electronically Signed   By: Trudie Reed M.D.   On: 08/04/2022 08:21

## 2022-08-30 LAB — CA 125: Cancer Antigen (CA) 125: 43.4 U/mL — ABNORMAL HIGH (ref 0.0–38.1)

## 2022-09-01 ENCOUNTER — Telehealth: Payer: Self-pay | Admitting: Hematology and Oncology

## 2022-09-02 ENCOUNTER — Inpatient Hospital Stay: Payer: Medicare HMO | Admitting: Licensed Clinical Social Worker

## 2022-09-09 ENCOUNTER — Other Ambulatory Visit: Payer: Medicare HMO | Admitting: Licensed Clinical Social Worker

## 2022-09-11 DIAGNOSIS — Z0100 Encounter for examination of eyes and vision without abnormal findings: Secondary | ICD-10-CM | POA: Diagnosis not present

## 2022-09-14 ENCOUNTER — Other Ambulatory Visit: Payer: Self-pay | Admitting: Registered Nurse

## 2022-09-15 ENCOUNTER — Telehealth: Payer: Self-pay | Admitting: Registered Nurse

## 2022-09-15 NOTE — Telephone Encounter (Signed)
Call placed to Alexandria Mclaughlin, she is taking the Trazadone without any complications. Refill sent to pharmacy. She verbalizes understanding.

## 2022-09-17 ENCOUNTER — Other Ambulatory Visit: Payer: Self-pay | Admitting: Hematology and Oncology

## 2022-09-19 ENCOUNTER — Inpatient Hospital Stay: Payer: Medicare HMO

## 2022-09-25 MED FILL — Dexamethasone Sodium Phosphate Inj 100 MG/10ML: INTRAMUSCULAR | Qty: 1 | Status: AC

## 2022-09-25 MED FILL — Fosaprepitant Dimeglumine For IV Infusion 150 MG (Base Eq): INTRAVENOUS | Qty: 5 | Status: AC

## 2022-09-26 ENCOUNTER — Inpatient Hospital Stay: Payer: Medicare HMO

## 2022-09-26 ENCOUNTER — Encounter: Payer: Self-pay | Admitting: Hematology and Oncology

## 2022-09-26 ENCOUNTER — Inpatient Hospital Stay: Payer: Medicare HMO | Attending: Psychiatry

## 2022-09-26 ENCOUNTER — Other Ambulatory Visit: Payer: Self-pay

## 2022-09-26 ENCOUNTER — Encounter: Payer: Self-pay | Admitting: General Practice

## 2022-09-26 ENCOUNTER — Inpatient Hospital Stay (HOSPITAL_BASED_OUTPATIENT_CLINIC_OR_DEPARTMENT_OTHER): Payer: Medicare HMO | Admitting: Hematology and Oncology

## 2022-09-26 VITALS — BP 123/74 | HR 81 | Temp 98.0°F | Resp 18 | Ht 63.0 in | Wt 131.2 lb

## 2022-09-26 VITALS — BP 140/76 | HR 73 | Temp 98.6°F | Resp 16

## 2022-09-26 DIAGNOSIS — G893 Neoplasm related pain (acute) (chronic): Secondary | ICD-10-CM | POA: Diagnosis not present

## 2022-09-26 DIAGNOSIS — K5903 Drug induced constipation: Secondary | ICD-10-CM

## 2022-09-26 DIAGNOSIS — Z79899 Other long term (current) drug therapy: Secondary | ICD-10-CM | POA: Diagnosis not present

## 2022-09-26 DIAGNOSIS — N200 Calculus of kidney: Secondary | ICD-10-CM | POA: Diagnosis not present

## 2022-09-26 DIAGNOSIS — M4726 Other spondylosis with radiculopathy, lumbar region: Secondary | ICD-10-CM | POA: Diagnosis not present

## 2022-09-26 DIAGNOSIS — I700 Atherosclerosis of aorta: Secondary | ICD-10-CM | POA: Diagnosis not present

## 2022-09-26 DIAGNOSIS — G894 Chronic pain syndrome: Secondary | ICD-10-CM | POA: Diagnosis not present

## 2022-09-26 DIAGNOSIS — C7951 Secondary malignant neoplasm of bone: Secondary | ICD-10-CM | POA: Insufficient documentation

## 2022-09-26 DIAGNOSIS — R5383 Other fatigue: Secondary | ICD-10-CM | POA: Insufficient documentation

## 2022-09-26 DIAGNOSIS — M5416 Radiculopathy, lumbar region: Secondary | ICD-10-CM

## 2022-09-26 DIAGNOSIS — C569 Malignant neoplasm of unspecified ovary: Secondary | ICD-10-CM

## 2022-09-26 DIAGNOSIS — Z79891 Long term (current) use of opiate analgesic: Secondary | ICD-10-CM | POA: Insufficient documentation

## 2022-09-26 DIAGNOSIS — T451X5A Adverse effect of antineoplastic and immunosuppressive drugs, initial encounter: Secondary | ICD-10-CM | POA: Diagnosis not present

## 2022-09-26 DIAGNOSIS — I2510 Atherosclerotic heart disease of native coronary artery without angina pectoris: Secondary | ICD-10-CM | POA: Diagnosis not present

## 2022-09-26 DIAGNOSIS — C786 Secondary malignant neoplasm of retroperitoneum and peritoneum: Secondary | ICD-10-CM | POA: Diagnosis not present

## 2022-09-26 DIAGNOSIS — R918 Other nonspecific abnormal finding of lung field: Secondary | ICD-10-CM | POA: Diagnosis not present

## 2022-09-26 DIAGNOSIS — Z5111 Encounter for antineoplastic chemotherapy: Secondary | ICD-10-CM | POA: Diagnosis not present

## 2022-09-26 DIAGNOSIS — T402X5A Adverse effect of other opioids, initial encounter: Secondary | ICD-10-CM

## 2022-09-26 DIAGNOSIS — G629 Polyneuropathy, unspecified: Secondary | ICD-10-CM | POA: Insufficient documentation

## 2022-09-26 DIAGNOSIS — D6481 Anemia due to antineoplastic chemotherapy: Secondary | ICD-10-CM | POA: Insufficient documentation

## 2022-09-26 LAB — CMP (CANCER CENTER ONLY)
ALT: 9 U/L (ref 0–44)
AST: 15 U/L (ref 15–41)
Albumin: 4 g/dL (ref 3.5–5.0)
Alkaline Phosphatase: 97 U/L (ref 38–126)
Anion gap: 7 (ref 5–15)
BUN: 20 mg/dL (ref 8–23)
CO2: 28 mmol/L (ref 22–32)
Calcium: 9.9 mg/dL (ref 8.9–10.3)
Chloride: 103 mmol/L (ref 98–111)
Creatinine: 0.53 mg/dL (ref 0.44–1.00)
GFR, Estimated: >60 mL/min (ref >60)
Glucose, Bld: 174 mg/dL — ABNORMAL HIGH (ref 70–99)
Potassium: 4.2 mmol/L (ref 3.5–5.1)
Sodium: 138 mmol/L (ref 135–145)
Total Bilirubin: 0.6 mg/dL (ref 0.3–1.2)
Total Protein: 6.7 g/dL (ref 6.5–8.1)

## 2022-09-26 LAB — CBC WITH DIFFERENTIAL (CANCER CENTER ONLY)
Abs Immature Granulocytes: 0.01 10*3/uL (ref 0.00–0.07)
Basophils Absolute: 0 10*3/uL (ref 0.0–0.1)
Basophils Relative: 0 %
Eosinophils Absolute: 0 10*3/uL (ref 0.0–0.5)
Eosinophils Relative: 0 %
HCT: 35.9 % — ABNORMAL LOW (ref 36.0–46.0)
Hemoglobin: 11.6 g/dL — ABNORMAL LOW (ref 12.0–15.0)
Immature Granulocytes: 0 %
Lymphocytes Relative: 19 %
Lymphs Abs: 0.9 10*3/uL (ref 0.7–4.0)
MCH: 31.3 pg (ref 26.0–34.0)
MCHC: 32.3 g/dL (ref 30.0–36.0)
MCV: 96.8 fL (ref 80.0–100.0)
Monocytes Absolute: 0.1 10*3/uL (ref 0.1–1.0)
Monocytes Relative: 1 %
Neutro Abs: 3.6 10*3/uL (ref 1.7–7.7)
Neutrophils Relative %: 80 %
Platelet Count: 164 10*3/uL (ref 150–400)
RBC: 3.71 MIL/uL — ABNORMAL LOW (ref 3.87–5.11)
RDW: 13.7 % (ref 11.5–15.5)
WBC Count: 4.5 10*3/uL (ref 4.0–10.5)
nRBC: 0 % (ref 0.0–0.2)

## 2022-09-26 MED ORDER — SODIUM CHLORIDE 0.9 % IV SOLN: Freq: Once | INTRAVENOUS | Status: AC

## 2022-09-26 MED ORDER — SODIUM CHLORIDE 0.9% FLUSH
10.0000 mL | Freq: Once | INTRAVENOUS | Status: AC
  Administered 2022-09-26: 10 mL

## 2022-09-26 MED ORDER — SODIUM CHLORIDE 0.9 % IV SOLN
150.0000 mg | Freq: Once | INTRAVENOUS | Status: AC
  Administered 2022-09-26: 150 mg via INTRAVENOUS
  Filled 2022-09-26: qty 150

## 2022-09-26 MED ORDER — CETIRIZINE HCL 10 MG/ML IV SOLN
10.0000 mg | Freq: Once | INTRAVENOUS | Status: AC
  Administered 2022-09-26: 10 mg via INTRAVENOUS
  Filled 2022-09-26: qty 1

## 2022-09-26 MED ORDER — SODIUM CHLORIDE 0.9 % IV SOLN
105.0000 mg/m2 | Freq: Once | INTRAVENOUS | Status: AC
  Administered 2022-09-26: 174 mg via INTRAVENOUS
  Filled 2022-09-26: qty 29

## 2022-09-26 MED ORDER — PALONOSETRON HCL INJECTION 0.25 MG/5ML
0.2500 mg | Freq: Once | INTRAVENOUS | Status: AC
  Administered 2022-09-26: 0.25 mg via INTRAVENOUS
  Filled 2022-09-26: qty 5

## 2022-09-26 MED ORDER — SODIUM CHLORIDE 0.9% FLUSH
10.0000 mL | INTRAVENOUS | Status: DC | PRN
  Administered 2022-09-26: 10 mL

## 2022-09-26 MED ORDER — SODIUM CHLORIDE 0.9 % IV SOLN
10.0000 mg | Freq: Once | INTRAVENOUS | Status: AC
  Administered 2022-09-26: 10 mg via INTRAVENOUS
  Filled 2022-09-26: qty 10

## 2022-09-26 MED ORDER — HEPARIN SOD (PORK) LOCK FLUSH 100 UNIT/ML IV SOLN
500.0000 [IU] | Freq: Once | INTRAVENOUS | Status: AC | PRN
  Administered 2022-09-26: 500 [IU]

## 2022-09-26 MED ORDER — SODIUM CHLORIDE 0.9 % IV SOLN
332.0000 mg | Freq: Once | INTRAVENOUS | Status: AC
  Administered 2022-09-26: 330 mg via INTRAVENOUS
  Filled 2022-09-26: qty 33

## 2022-09-26 MED ORDER — FAMOTIDINE IN NACL 20-0.9 MG/50ML-% IV SOLN
20.0000 mg | Freq: Once | INTRAVENOUS | Status: AC
  Administered 2022-09-26: 20 mg via INTRAVENOUS
  Filled 2022-09-26: qty 50

## 2022-09-26 NOTE — Assessment & Plan Note (Signed)
Overall, she has positive response to therapy Her side effects are stable We will proceed with treatment today and I plan to repeat another imaging study before her next cycle of therapy

## 2022-09-26 NOTE — Assessment & Plan Note (Signed)
Discussed importance of aggressive laxative therapy while on chemotherapy

## 2022-09-26 NOTE — Patient Instructions (Signed)
[facility]   Discharge Instructions: Thank you for choosing [facility] to provide your oncology and hematology care.   If you have a lab appointment with the Cancer Center, please go directly to the Cancer Center and check in at the registration area.   Wear comfortable clothing and clothing appropriate for easy access to any Portacath or PICC line.   We strive to give you quality time with your provider. You may need to reschedule your appointment if you arrive late (15 or more minutes).  Arriving late affects you and other patients whose appointments are after yours.  Also, if you miss three or more appointments without notifying the office, you may be dismissed from the clinic at the provider's discretion.      For prescription refill requests, have your pharmacy contact our office and allow 72 hours for refills to be completed.    Today you received the following chemotherapy and/or immunotherapy agents: Paclitaxel (Taxol) and Carboplatin       To help prevent nausea and vomiting after your treatment, we encourage you to take your nausea medication as directed.  BELOW ARE SYMPTOMS THAT SHOULD BE REPORTED IMMEDIATELY: *FEVER GREATER THAN 100.4 F (38 C) OR HIGHER *CHILLS OR SWEATING *NAUSEA AND VOMITING THAT IS NOT CONTROLLED WITH YOUR NAUSEA MEDICATION *UNUSUAL SHORTNESS OF BREATH *UNUSUAL BRUISING OR BLEEDING *URINARY PROBLEMS (pain or burning when urinating, or frequent urination) *BOWEL PROBLEMS (unusual diarrhea, constipation, pain near the anus) TENDERNESS IN MOUTH AND THROAT WITH OR WITHOUT PRESENCE OF ULCERS (sore throat, sores in mouth, or a toothache) UNUSUAL RASH, SWELLING OR PAIN  UNUSUAL VAGINAL DISCHARGE OR ITCHING   Items with * indicate a potential emergency and should be followed up as soon as possible or go to the Emergency Department if any problems should occur.  Please show the CHEMOTHERAPY ALERT CARD or  IMMUNOTHERAPY ALERT CARD at check-in to the Emergency Department and triage nurse.  Should you have questions after your visit or need to cancel or reschedule your appointment, please contact [facility]  Dept: [phone]  and follow the prompts.  Office hours are 8:00 a.m. to 4:30 p.m. Monday - Friday. Please note that voicemails left after 4:00 p.m. may not be returned until the following business day.  We are closed weekends and major holidays. You have access to a nurse at all times for urgent questions. Please call the main number to the clinic Dept: [phone] and follow the prompts.   For any non-urgent questions, you may also contact your provider using MyChart. We now offer e-Visits for anyone 18 and older to request care online for non-urgent symptoms. For details visit [email or web address].   Also download the MyChart app! Go to the app store, search "MyChart", open the app, select [facility], and log in with your MyChart username and password.   

## 2022-09-26 NOTE — Assessment & Plan Note (Signed)
She had significant chronic pain syndrome related to problems with her back Her pain is poorly controlled at baseline So far, she denies worsening pain or peripheral neuropathy on treatment 

## 2022-09-26 NOTE — Progress Notes (Signed)
[facility] Spiritual Care Note  Followed up with Alexandria Mclaughlin in infusion. She was in good spirits overall, noting that she is generally feeling good. She has created a list of four churches she would like to visit on occasions when her son is off from work and can accompany her. Alexandria Mclaughlin also shared elements of life review, especially themes of grief and the meaningfulness of travel (and its associated learning)  her in other stages of her life.  Plan to follow up by phone for pastoral check in next month.  [address] [place], [place], [facility] Pager [phone] Voicemail [phone]

## 2022-09-26 NOTE — Progress Notes (Signed)
[facility] OFFICE PROGRESS NOTE  Patient Care Team: Georgianne Fick, MD as PCP - General (Internal Medicine) Parke Poisson, MD as PCP - Cardiology (Cardiology) Drema Dallas, DO as Consulting Physician (Neurology)  ASSESSMENT & PLAN:  Ovarian cancer (HCC) Overall, she has positive response to therapy Her side effects are stable We will proceed with treatment today and I plan to repeat another imaging study before her next cycle of therapy  Anemia due to antineoplastic chemotherapy This is likely due to recent treatment. The patient denies recent history of bleeding such as epistaxis, hematuria or hematochezia. She is asymptomatic from the anemia. I will observe for now.  She does not require transfusion now. I will continue the chemotherapy at current dose without dosage adjustment.  If the anemia gets progressive worse in the future, I might have to delay her treatment or adjust the chemotherapy dose.   Therapeutic opioid induced constipation Discussed importance of aggressive laxative therapy while on chemotherapy  Lumbar radiculopathy She had significant chronic pain syndrome related to problems with her back Her pain is poorly controlled at baseline So far, she denies worsening pain or peripheral neuropathy on treatment  Orders Placed This Encounter  Procedures   CT CHEST ABDOMEN PELVIS W CONTRAST    Standing Status:   Future    Standing Expiration Date:   09/26/2023    Scheduling Instructions:     No oral contrast    Order Specific Question:   If indicated for the ordered procedure, I authorize the administration of contrast media per Radiology protocol    Answer:   Yes    Order Specific Question:   Does the patient have a contrast media/X-Ellerson dye allergy?    Answer:   No    Order Specific Question:   Preferred imaging location?    Answer:   [facility]    Order Specific Question:   If indicated for the ordered procedure, I authorize the  administration of oral contrast media per Radiology protocol    Answer:   Yes   CBC with Differential (Cancer Center Only)    Standing Status:   Future    Standing Expiration Date:   10/18/2023   CMP (Cancer Center only)    Standing Status:   Future    Standing Expiration Date:   10/18/2023    All questions were answered. The patient knows to call the clinic with any problems, questions or concerns. The total time spent in the appointment was 30 minutes encounter with patients including review of chart and various tests results, discussions about plan of care and coordination of care plan   Artis Delay, MD 09/26/2022 8:39 AM  INTERVAL HISTORY: Please see below for problem oriented charting. she returns for treatment follow-up seen prior to treatment Since last time I saw her, majority of her symptoms are stable Typically, she remain chronically constipated with bowel movement every 3 days Her chronic back pain is stable She denies worsening neuropathy No other side effects of treatment We discussed planning for imaging study for assessment of response to treatment  REVIEW OF SYSTEMS:   Constitutional: Denies fevers, chills or abnormal weight loss Eyes: Denies blurriness of vision Ears, nose, mouth, throat, and face: Denies mucositis or sore throat Respiratory: Denies cough, dyspnea or wheezes Cardiovascular: Denies palpitation, chest discomfort or lower extremity swelling Skin: Denies abnormal skin rashes Lymphatics: Denies new lymphadenopathy or easy bruising Behavioral/Psych: Mood is stable, no new changes  All other systems were  reviewed with the patient and are negative.  I have reviewed the past medical history, past surgical history, social history and family history with the patient and they are unchanged from previous note.  ALLERGIES:  has No Known Allergies.  MEDICATIONS:  Current Outpatient Medications  Medication Sig Dispense Refill   aspirin-acetaminophen-caffeine  (EXCEDRIN MIGRAINE) 250-250-65 MG tablet Take 1 tablet by mouth every 6 (six) hours as needed for headache or migraine.     Cholecalciferol (VITAMIN D) 50 MCG (2000 UT) CAPS Take 2,000 Units by mouth daily.     clonazePAM (KLONOPIN) 0.5 MG tablet TAKE 1 TABLET BY MOUTH AT BEDTIME AS NEEDED FOR ANXIETY 30 tablet 2   cyanocobalamin 1000 MCG tablet 1,000 mcg daily.     dexamethasone (DECADRON) 4 MG tablet Take 2 tabs at the night before and 2 tab the morning of chemotherapy, every 3 weeks, by mouth x 6 cycles 24 tablet 6   DULoxetine (CYMBALTA) 60 MG capsule TAKE 1 CAPSULE BY MOUTH EVERY DAY 90 capsule 3   fentaNYL (DURAGESIC) 25 MCG/HR Place 1 patch onto the skin every 3 (three) days. 10 patch 0   gabapentin (NEURONTIN) 600 MG tablet TAKE 1 TABLET BY MOUTH FOUR TIMES A DAY 360 tablet 1   irbesartan (AVAPRO) 150 MG tablet Take 150 mg by mouth daily.     lactulose (CHRONULAC) 10 GM/15ML solution TAKE 15 MLS (10 G TOTAL) BY MOUTH 3 (THREE) TIMES DAILY. 473 mL 1   magnesium oxide (MAG-OX) 400 (240 Mg) MG tablet TAKE 1 TABLET BY MOUTH EVERY DAY 30 tablet 1   Melatonin 10 MG TABS Take 10 mg by mouth at bedtime as needed (sleep).     methimazole (TAPAZOLE) 5 MG tablet Take 5 mg by mouth 2 (two) times a week.     ondansetron (ZOFRAN) 8 MG tablet Take 1 tablet (8 mg total) by mouth every 8 (eight) hours as needed for nausea or vomiting. Start on the third day after carboplatin. 30 tablet 1   oxyCODONE-acetaminophen (PERCOCET) 10-325 MG tablet Take 1 tablet by mouth 5 (five) times daily as needed for pain. 130 tablet 0   polyethylene glycol (MIRALAX / GLYCOLAX) 17 g packet Take 17 g by mouth 2 (two) times daily.     prochlorperazine (COMPAZINE) 10 MG tablet Take 1 tablet (10 mg total) by mouth every 6 (six) hours as needed for nausea or vomiting. 30 tablet 1   rosuvastatin (CRESTOR) 40 MG tablet Take 1 tablet (40 mg total) by mouth at bedtime. 90 tablet 3   senna (SENOKOT) 8.6 MG tablet Take 2 tablets by mouth  2 (two) times daily.     traZODone (DESYREL) 100 MG tablet TAKE 1 TABLET BY MOUTH EVERYDAY AT BEDTIME 90 tablet 2   No current facility-administered medications for this visit.    SUMMARY OF ONCOLOGIC HISTORY: Oncology History Overview Note  High grade serous   Ovarian cancer (HCC)  03/17/2022 Imaging   CT Abdomen/Pelvis: IMPRESSION: 1. Heterogeneous right adnexal mass measuring 3.8 x 3.0 cm with associated soft tissue thickening along the peritoneal reflections and greater omentum. Findings are highly suspicious for ovarian neoplasm with peritoneal carcinomatosis. 2. No evidence of bowel obstruction. 3. Coronary artery calcifications.   03/18/2022 Tumor Marker   CA125: 163   04/08/2022 Surgery   Diagnostic laparoscopy with peritoneal and omental biopsies  Findings: On entry to abdomen, omentum and large bowel broadly and densely adherent to the anterior abdominal wall in the midline from the level of the  falciform ligament superiorly to the bladder inferiorly. Evidence of peritoneal plaques consistent with peritoneal carcinomatosis on the left diaphragm and left anterior abdominal wall and pelvic peritoneum. Right diaphragm and liver limited in visualization but no evidence of disease. Left liver lobe and stomach normal. Right abdomen not visualized due to the midline adhesions. Pelvic mass not visualized due to adhesions. Omentum adherent to the anterior abdominal wall consistent with omental cake. Small bowel and mesentery that is able to be visualized does not appear to have disease. Initial peritoneal biopsy indeterminate for carcinoma on frozen. Additional biopsies obtained.    04/08/2022 Initial Diagnosis   Ovarian cancer (HCC)   04/08/2022 Pathology Results   A. PERITONEUM, BIOPSY:  Microscopic focus of adenocarcinoma.  See comment.   B. PERITONEUM, #2, BIOPSY:  Microscopic focus of adenocarcinoma.   C. OMENTUM, BIOPSY:  Adenocarcinoma.  See comment.   COMMENT:  A. The  permanent recuts of the peritoneal biopsy submitted for frozen  section show a microscopic focus of adenocarcinoma which is not  identified in the frozen section slides.   C.  The omental specimen shows adenocarcinoma and immunohistochemistry  will be performed and reported as an addendum.  The morphologic features  favor an ovarian primary.   ADDENDUM:  Immunohistochemistry shows the carcinoma is positive with PAX8 and p16 with patchy positive staining with WT1 and estrogen receptor.  Progesterone receptor is negative.  P53 shows diffuse strong positivity (mutated pattern).  The morphology and immunophenotype are consistent with high-grade serous carcinoma.    04/08/2022 Pathology Results   CYTOLOGY FINAL MICROSCOPIC DIAGNOSIS:  - Malignant cells consistent with adenocarcinoma  - See comment    04/17/2022 Cancer Staging   Staging form: Ovary, Fallopian Tube, and Primary Peritoneal Carcinoma, AJCC 8th Edition - Clinical: FIGO Stage IIIC (cT3c, cN0, cM0) - Signed by Artis Delay, MD on 04/17/2022 Stage prefix: Initial diagnosis   04/30/2022 -  Chemotherapy   Patient is on Treatment Plan : OVARIAN Carboplatin (AUC 6) + Paclitaxel (175) q21d X 6 Cycles     05/02/2022 Tumor Marker   Patient's tumor was tested for the following markers: CA-125. Results of the tumor marker test revealed 193.   05/26/2022 Procedure   Technically successful right IJ power-injectable port catheter placement. Ready for routine use.     05/30/2022 Tumor Marker   Patient's tumor was tested for the following markers: CA-125. Results of the tumor marker test revealed 93.5.   07/04/2022 Genetic Testing   Negative [facility] Multi-Cancer +RNA Panel.  VUS in ALK at c.2239G>A (p.Gly747Arg).  Report date is 07/04/2022.   The Multi-Cancer + RNA Panel offered by [facility] includes sequencing and/or deletion/duplication analysis of the following 70 genes:  AIP*, ALK, APC*, ATM*, AXIN2*, BAP1*, BARD1*, BLM*, BMPR1A*, BRCA1*,  BRCA2*, BRIP1*, CDC73*, CDH1*, CDK4, CDKN1B*, CDKN2A, CHEK2*, CTNNA1*, DICER1*, EPCAM (del/dup only), EGFR, FH*, FLCN*, GREM1 (promoter dup only), HOXB13, KIT, LZTR1, MAX*, MBD4, MEN1*, MET, MITF, MLH1*, MSH2*, MSH3*, MSH6*, MUTYH*, NF1*, NF2*, NTHL1*, PALB2*, PDGFRA, PMS2*, POLD1*, POLE*, POT1*, PRKAR1A*, PTCH1*, PTEN*, RAD51C*, RAD51D*, RB1*, RET, SDHA* (sequencing only), SDHAF2*, SDHB*, SDHC*, SDHD*, SMAD4*, SMARCA4*, SMARCB1*, SMARCE1*, STK11*, SUFU*, TMEM127*, TP53*, TSC1*, TSC2*, VHL*. RNA analysis is performed for * genes.    08/04/2022 Imaging   1. Today's study demonstrates stable burden of disease. Specifically, previously noted right ovarian mass appears grossly unchanged, as does the extent of intraperitoneal metastatic disease, as detailed above. 2. Multiple small solid-appearing pulmonary nodules measuring 5 mm or less noted on the prior examination are unchanged.  3. Probable right upper lobe bronchopneumonia, as above. 4. Aortic atherosclerosis, in addition to left main and three-vessel coronary artery disease. 5. Additional incidental findings, as above.   09/01/2022 Tumor Marker   Patient's tumor was tested for the following markers: CA-125. Results of the tumor marker test revealed 43.4.     PHYSICAL EXAMINATION: ECOG PERFORMANCE STATUS: 2 - Symptomatic, <50% confined to bed  Vitals:   09/26/22 0828  BP: 123/74  Pulse: 81  Resp: 18  Temp: 98 F (36.7 C)  SpO2: 95%   Filed Weights   09/26/22 0828  Weight: 131 lb 3.2 oz (59.5 kg)    GENERAL:alert, no distress and comfortable NEURO: alert & oriented x 3 with fluent speech, no focal motor/sensory deficits  LABORATORY DATA:  I have reviewed the data as listed    Component Value Date/Time   NA 141 08/29/2022 0855   NA 139 09/19/2015 1422   K 4.2 08/29/2022 0855   CL 105 08/29/2022 0855   CO2 28 08/29/2022 0855   GLUCOSE 146 (H) 08/29/2022 0855   BUN 21 08/29/2022 0855   BUN 20 09/19/2015 1422   CREATININE 0.53  08/29/2022 0855   CALCIUM 9.9 08/29/2022 0855   PROT 6.1 (L) 08/29/2022 0855   PROT 7.0 09/19/2015 1422   ALBUMIN 3.8 08/29/2022 0855   ALBUMIN 4.6 09/19/2015 1422   AST 15 08/29/2022 0855   ALT 11 08/29/2022 0855   ALKPHOS 98 08/29/2022 0855   BILITOT 0.5 08/29/2022 0855   GFRNONAA >60 08/29/2022 0855   GFRAA >60 04/20/2018 1452    No results found for: "SPEP", "UPEP"  Lab Results  Component Value Date   WBC 4.5 09/26/2022   NEUTROABS 3.6 09/26/2022   HGB 11.6 (L) 09/26/2022   HCT 35.9 (L) 09/26/2022   MCV 96.8 09/26/2022   PLT 164 09/26/2022      Chemistry      Component Value Date/Time   NA 141 08/29/2022 0855   NA 139 09/19/2015 1422   K 4.2 08/29/2022 0855   CL 105 08/29/2022 0855   CO2 28 08/29/2022 0855   BUN 21 08/29/2022 0855   BUN 20 09/19/2015 1422   CREATININE 0.53 08/29/2022 0855      Component Value Date/Time   CALCIUM 9.9 08/29/2022 0855   ALKPHOS 98 08/29/2022 0855   AST 15 08/29/2022 0855   ALT 11 08/29/2022 0855   BILITOT 0.5 08/29/2022 0855

## 2022-09-26 NOTE — Assessment & Plan Note (Signed)

## 2022-09-27 ENCOUNTER — Other Ambulatory Visit: Payer: Self-pay

## 2022-10-02 ENCOUNTER — Telehealth: Payer: Self-pay | Admitting: Oncology

## 2022-10-02 NOTE — Telephone Encounter (Signed)
Left a message with appointment to see Dr. Alvester Morin on 10/13/2022 at 2:30.

## 2022-10-07 ENCOUNTER — Other Ambulatory Visit: Payer: Self-pay | Admitting: Registered Nurse

## 2022-10-07 DIAGNOSIS — E0590 Thyrotoxicosis, unspecified without thyrotoxic crisis or storm: Secondary | ICD-10-CM | POA: Diagnosis not present

## 2022-10-07 DIAGNOSIS — M961 Postlaminectomy syndrome, not elsewhere classified: Secondary | ICD-10-CM

## 2022-10-07 DIAGNOSIS — M5416 Radiculopathy, lumbar region: Secondary | ICD-10-CM

## 2022-10-07 NOTE — Telephone Encounter (Signed)
Gabapentin refilled today.  Call placed to Ms. Meiner regarding the above, she verbalizes understanding.

## 2022-10-08 ENCOUNTER — Ambulatory Visit (HOSPITAL_COMMUNITY): Payer: Medicare HMO

## 2022-10-09 ENCOUNTER — Ambulatory Visit (HOSPITAL_COMMUNITY): Payer: Medicare HMO

## 2022-10-09 ENCOUNTER — Telehealth: Payer: Self-pay | Admitting: Oncology

## 2022-10-09 ENCOUNTER — Telehealth: Payer: Self-pay

## 2022-10-09 NOTE — Telephone Encounter (Signed)
Called Alexandria Mclaughlin and rescheduled her appointment with Dr. Alvester Morin to 10/20/22 at 3:15 so that it will be after her CT scan on 10/15/22.

## 2022-10-09 NOTE — Telephone Encounter (Signed)
Called her back and told her CT approved. She will call radiology scheduling to schedule CT.

## 2022-10-11 ENCOUNTER — Other Ambulatory Visit: Payer: Self-pay

## 2022-10-13 ENCOUNTER — Encounter: Payer: Self-pay | Admitting: Hematology and Oncology

## 2022-10-13 ENCOUNTER — Inpatient Hospital Stay: Payer: Medicare HMO | Admitting: Psychiatry

## 2022-10-13 DIAGNOSIS — C561 Malignant neoplasm of right ovary: Secondary | ICD-10-CM

## 2022-10-14 NOTE — Progress Notes (Signed)
Subjective:    Patient ID: Alexandria Mclaughlin, female    DOB: 02-19-1941, 82 y.o.   MRN: [id number]  HPI: Alexandria Mclaughlin is a 82 y.o. female who returns for follow up appointment for chronic pain and medication refill. She states her pain is located in her lower back. She rates her pain 7. Her current exercise regime is walking with her walker.   [name] Morphine equivalent is 135.00 MME. She  is also prescribed Clonazepam .We have discussed the black box warning of using opioids and benzodiazepines. I highlighted the dangers of using these drugs together and discussed the adverse events including respiratory suppression, overdose, cognitive impairment and importance of compliance with current regimen. We will continue to monitor and adjust as indicated.  .    Last Oral Swab was Performed on 08/21/2022, it was consistent.     Pain Inventory Average Pain 4 Pain Right Now 4 My pain is intermittent, sharp, and stabbing  In the last 24 hours, has pain interfered with the following? General activity 7 Relation with others 7 Enjoyment of life 7 What TIME of day is your pain at its worst? daytime Sleep (in general) Fair  Pain is worse with: walking, bending, sitting, and standing Pain improves with: rest, heat/ice, and medication Relief from Meds: 6  Family History  Problem Relation Age of Onset   Diabetes Mother    Heart disease Mother    Stroke Mother    Heart disease Father    Heart disease Brother    Colon cancer Neg Hx    Breast cancer Neg Hx    Ovarian cancer Neg Hx    Endometrial cancer Neg Hx    Pancreatic cancer Neg Hx    Prostate cancer Neg Hx    Social History   Socioeconomic History   Marital status: Widowed    Spouse name: Not on file   Number of children: Not on file   Years of education: 14   Highest education level: Associate degree: academic program  Occupational History   Occupation: Retired  Tobacco Use   Smoking status: Former    Current packs/day: 0.00     Average packs/day: 0.3 packs/day for 15.0 years (3.8 ttl pk-yrs)    Types: Cigarettes    Start date: 03/15/1953    Quit date: 03/15/1968    Years since quitting: 54.6   Smokeless tobacco: Never  Vaping Use   Vaping status: Never Used  Substance and Sexual Activity   Alcohol use: Not Currently    Comment: occasional glass of wine   Drug use: Yes    Frequency: 20.0 times per week    Types: Fentanyl, Hydrocodone   Sexual activity: Not Currently  Other Topics Concern   Not on file  Social History Narrative   Right handed   Drinks caffeine   Condo two story with Engineer, structural   Social Determinants of Health   Financial Resource Strain: Not on file  Food Insecurity: Not on file  Transportation Needs: Not on file  Physical Activity: Not on file  Stress: Not on file  Social Connections: Unknown (07/04/2021)   [facility]   Social Network    Social Network: Not on file   Past Surgical History:  Procedure Laterality Date   ABDOMINAL HYSTERECTOMY     Due to fibroids   BACK SURGERY  2011   lumbar Fusion   BLADDER SURGERY  1985   CESAREAN SECTION     x2  EYE SURGERY Bilateral    Cataract surgery with IOL   FOOT SURGERY Right    "just scraped the bone"   IR IMAGING GUIDED PORT INSERTION  05/26/2022   LAPAROSCOPY N/A 04/08/2022   Procedure: LAPAROSCOPY DIAGNOSTIC WITH BIOPSIES;  Surgeon: Clide Cliff, MD;  Location: [facility];  Service: Gynecology;  Laterality: N/A;   RETINAL DETACHMENT SURGERY Right 10/2011   TRANSFORAMINAL LUMBAR INTERBODY FUSION W/ MIS 1 LEVEL Right 04/26/2018   Procedure: Right Lumbar four-five Minimally invasive Transforaminal lumbar interbody fusion;  Surgeon: Jadene Pierini, MD;  Location: [facility] OR;  Service: Neurosurgery;  Laterality: Right;   Past Surgical History:  Procedure Laterality Date   ABDOMINAL HYSTERECTOMY     Due to fibroids   BACK SURGERY  2011   lumbar Fusion   BLADDER SURGERY  1985   CESAREAN SECTION     x2   EYE  SURGERY Bilateral    Cataract surgery with IOL   FOOT SURGERY Right    "just scraped the bone"   IR IMAGING GUIDED PORT INSERTION  05/26/2022   LAPAROSCOPY N/A 04/08/2022   Procedure: LAPAROSCOPY DIAGNOSTIC WITH BIOPSIES;  Surgeon: Clide Cliff, MD;  Location: [facility];  Service: Gynecology;  Laterality: N/A;   RETINAL DETACHMENT SURGERY Right 10/2011   TRANSFORAMINAL LUMBAR INTERBODY FUSION W/ MIS 1 LEVEL Right 04/26/2018   Procedure: Right Lumbar four-five Minimally invasive Transforaminal lumbar interbody fusion;  Surgeon: Jadene Pierini, MD;  Location: [facility] OR;  Service: Neurosurgery;  Laterality: Right;   Past Medical History:  Diagnosis Date   Abnormality of gait 02/07/2015   Acquired hallux rigidus of left foot 12/17/2020   Acquired unequal leg length on left 08/08/2011   AMS (altered mental status) 12/28/2020   Biceps tendonitis on left 08/18/2018   BPPV (benign paroxysmal positional vertigo) 02/07/2015   Bunion 12/17/2020   Cancer (HCC) 04/25/2022   Cerebrovascular disease    Cervical facet syndrome    Coronary artery disease 04/27/2022   Disorders of sacrum    Enthesopathy of hip region    Essential hypertension 02/07/2015   Generalized anxiety disorder    GERD (gastroesophageal reflux disease)    Headache    History of kidney stones    HLD (hyperlipidemia) 02/07/2015   Low back pain 02/07/2015   Lumbar facet arthropathy 07/05/2014   Lumbar post-laminectomy syndrome 06/11/2011   Lumbar radicular pain 07/29/2016   Lumbar radiculopathy 04/26/2018   Major depressive disorder    Mild cognitive impairment of uncertain or unknown etiology 01/24/2022   Multiple lacunar infarcts    MRI - bilateral basal ganglia and left thalamus   Myoclonus 09/19/2015   Rotator cuff tendonitis, left 06/08/2018   Sacroiliac joint dysfunction 06/11/2011   Sciatic neuritis    Stroke (HCC)    hx TIA   Subcortical infarction    2016 MRI - small chronic subcortical infarct with  associated chronic hemosiderin deposition within the right superior frontal gyrus.   Synovitis and tenosynovitis    Therapeutic opioid induced constipation 07/25/2015   Thyroid disease    Transient left leg weakness    Vision abnormalities    There were no vitals taken for this visit.  Opioid Risk Score:   Fall Risk Score:  `1  Depression screen PHQ 2/9     08/21/2022    3:01 PM 04/24/2022    1:11 PM 02/25/2022    1:02 PM 08/28/2021    2:31 PM 06/19/2021    2:53 PM 02/12/2021    2:41 PM  12/13/2020    1:55 PM  Depression screen PHQ 2/9  Decreased Interest 1 1 0 0 1 0 1  Down, Depressed, Hopeless 1 1 0 0 1 0 1  PHQ - 2 Score 2 2 0 0 2 0 2    Review of Systems  Musculoskeletal:  Positive for gait problem.       Right buttock pain  All other systems reviewed and are negative.      Objective:   Physical Exam Vitals and nursing note reviewed.  Constitutional:      Appearance: Normal appearance.  Cardiovascular:     Rate and Rhythm: Normal rate and regular rhythm.  Pulmonary:     Effort: Pulmonary effort is normal.     Breath sounds: Normal breath sounds.  Musculoskeletal:     Cervical back: Normal range of motion and neck supple.     Comments: Normal Muscle Bulk and Muscle Testing Reveals:  Upper Extremities: Decreased ROM 90 Degrees and Muscle Strength 5/5 Lumbar Paraspinal Tenderness: L-4-L-5 Lower Extremities: Full ROM and Muscle Strength 5/5 Arises from Table slowly using walker for support Narrow Based  Gait     Skin:    General: Skin is warm and dry.  Neurological:     Mental Status: She is alert and oriented to person, place, and time.  Psychiatric:        Mood and Affect: Mood normal.        Behavior: Behavior normal.         Assessment & Plan:  1. Facet Arthropathy/ Sacroiliac Joint Dysfunction: 10/15/2022. Refilled: Fentanyl 25 mcg patch  # 10- one patch every 3 days and   Percocet 10/325 mg one tablet 5 times a day as needed for pain #130 Second  script  given for the following month.  Continue to Monitor. . We will continue the opioid monitoring program, this consists of regular clinic visits, examinations, urine drug screen, pill counts as well as use of [place] Controlled Substance Reporting system. A 12 month History has been reviewed on the [place] Controlled Substance Reporting System on 10/15/2022.  2. Lumbar Radicular  Pain: Continue HEP as Tolerated. Continue to Monitor. Continue Gabapentin and Cymbalta. 10/15/2022 3. Gait Disorder: Continue HEP / Neurology Following. 08/ 21/2024 4. Myoclonus with sleep: Continue Klonopin. Continue current medication regimen. Continue to Monitor. 10/15/2022 5. Memory Changes:  Neurology:Following: No complaints today.  Continue to Monitor. 10/15/2022 6. Right Shoulder Tendinitis: No complaints today.  Continue to Monitor. 10/15/2022  7. Malignant Neoplasm: Oncology Following. Continue to Monitor. 10/15/2022   F/U in 2 months

## 2022-10-15 ENCOUNTER — Encounter: Payer: Medicare HMO | Attending: Physical Medicine and Rehabilitation | Admitting: Registered Nurse

## 2022-10-15 ENCOUNTER — Ambulatory Visit (HOSPITAL_COMMUNITY)
Admission: RE | Admit: 2022-10-15 | Discharge: 2022-10-15 | Disposition: A | Discharge status: Home / Self Care | Payer: Medicare HMO | Source: Ambulatory Visit | Attending: Hematology and Oncology | Admitting: Hematology and Oncology

## 2022-10-15 ENCOUNTER — Encounter: Payer: Self-pay | Admitting: Registered Nurse

## 2022-10-15 ENCOUNTER — Encounter: Payer: Self-pay | Admitting: Hematology and Oncology

## 2022-10-15 VITALS — BP 117/79 | HR 87 | Ht 63.0 in | Wt 131.0 lb

## 2022-10-15 DIAGNOSIS — I2510 Atherosclerotic heart disease of native coronary artery without angina pectoris: Secondary | ICD-10-CM | POA: Insufficient documentation

## 2022-10-15 DIAGNOSIS — Z5181 Encounter for therapeutic drug level monitoring: Secondary | ICD-10-CM

## 2022-10-15 DIAGNOSIS — G894 Chronic pain syndrome: Secondary | ICD-10-CM | POA: Diagnosis not present

## 2022-10-15 DIAGNOSIS — C7951 Secondary malignant neoplasm of bone: Secondary | ICD-10-CM | POA: Diagnosis not present

## 2022-10-15 DIAGNOSIS — G253 Myoclonus: Secondary | ICD-10-CM | POA: Diagnosis not present

## 2022-10-15 DIAGNOSIS — I700 Atherosclerosis of aorta: Secondary | ICD-10-CM | POA: Insufficient documentation

## 2022-10-15 DIAGNOSIS — Z79891 Long term (current) use of opiate analgesic: Secondary | ICD-10-CM

## 2022-10-15 DIAGNOSIS — R269 Unspecified abnormalities of gait and mobility: Secondary | ICD-10-CM | POA: Diagnosis not present

## 2022-10-15 DIAGNOSIS — M47896 Other spondylosis, lumbar region: Secondary | ICD-10-CM | POA: Insufficient documentation

## 2022-10-15 DIAGNOSIS — M47816 Spondylosis without myelopathy or radiculopathy, lumbar region: Secondary | ICD-10-CM | POA: Diagnosis not present

## 2022-10-15 DIAGNOSIS — N200 Calculus of kidney: Secondary | ICD-10-CM | POA: Diagnosis not present

## 2022-10-15 DIAGNOSIS — C569 Malignant neoplasm of unspecified ovary: Secondary | ICD-10-CM | POA: Diagnosis not present

## 2022-10-15 MED ORDER — OXYCODONE-ACETAMINOPHEN 10-325 MG PO TABS: 1.0000 | ORAL_TABLET | Freq: Every day | ORAL | 0 refills | Status: DC | PRN

## 2022-10-15 MED ORDER — FENTANYL 25 MCG/HR TD PT72: 1.0000 | MEDICATED_PATCH | TRANSDERMAL | 0 refills | Status: DC

## 2022-10-15 NOTE — Progress Notes (Signed)
This encounter was created in error - please disregard.

## 2022-10-16 ENCOUNTER — Other Ambulatory Visit: Payer: Self-pay | Admitting: Hematology and Oncology

## 2022-10-16 ENCOUNTER — Other Ambulatory Visit: Payer: Self-pay

## 2022-10-16 ENCOUNTER — Telehealth: Payer: Self-pay

## 2022-10-16 MED FILL — Dexamethasone Sodium Phosphate Inj 100 MG/10ML: INTRAMUSCULAR | Qty: 1 | Status: AC

## 2022-10-16 MED FILL — Fosaprepitant Dimeglumine For IV Infusion 150 MG (Base Eq): INTRAVENOUS | Qty: 5 | Status: AC

## 2022-10-16 NOTE — Telephone Encounter (Signed)
Returned her call. She noticed that her port/lab flush appt has been canceled for tomorrow. Left a message that Dr. Bertis Ruddy canceled port/lab and infusion appt due to the results of her CT and will explain during office visit tomorrow. Ask her to call the office for questions.

## 2022-10-16 NOTE — Telephone Encounter (Signed)
She called back. Given below message again. She has not checked her message. She verbalized understanding.

## 2022-10-17 ENCOUNTER — Inpatient Hospital Stay: Payer: Medicare HMO

## 2022-10-17 ENCOUNTER — Telehealth: Payer: Self-pay | Admitting: *Deleted

## 2022-10-17 ENCOUNTER — Encounter: Payer: Self-pay | Admitting: Hematology and Oncology

## 2022-10-17 ENCOUNTER — Inpatient Hospital Stay (HOSPITAL_BASED_OUTPATIENT_CLINIC_OR_DEPARTMENT_OTHER): Payer: Medicare HMO | Admitting: Hematology and Oncology

## 2022-10-17 VITALS — BP 130/78 | HR 74 | Temp 97.4°F | Resp 18 | Ht 63.0 in | Wt 132.4 lb

## 2022-10-17 DIAGNOSIS — I2510 Atherosclerotic heart disease of native coronary artery without angina pectoris: Secondary | ICD-10-CM | POA: Diagnosis not present

## 2022-10-17 DIAGNOSIS — C7951 Secondary malignant neoplasm of bone: Secondary | ICD-10-CM | POA: Diagnosis not present

## 2022-10-17 DIAGNOSIS — T451X5A Adverse effect of antineoplastic and immunosuppressive drugs, initial encounter: Secondary | ICD-10-CM | POA: Diagnosis not present

## 2022-10-17 DIAGNOSIS — C786 Secondary malignant neoplasm of retroperitoneum and peritoneum: Secondary | ICD-10-CM | POA: Diagnosis not present

## 2022-10-17 DIAGNOSIS — Z7189 Other specified counseling: Secondary | ICD-10-CM

## 2022-10-17 DIAGNOSIS — G894 Chronic pain syndrome: Secondary | ICD-10-CM | POA: Diagnosis not present

## 2022-10-17 DIAGNOSIS — K5903 Drug induced constipation: Secondary | ICD-10-CM | POA: Diagnosis not present

## 2022-10-17 DIAGNOSIS — M4726 Other spondylosis with radiculopathy, lumbar region: Secondary | ICD-10-CM | POA: Diagnosis not present

## 2022-10-17 DIAGNOSIS — Z5111 Encounter for antineoplastic chemotherapy: Secondary | ICD-10-CM | POA: Diagnosis not present

## 2022-10-17 DIAGNOSIS — C569 Malignant neoplasm of unspecified ovary: Secondary | ICD-10-CM

## 2022-10-17 DIAGNOSIS — G893 Neoplasm related pain (acute) (chronic): Secondary | ICD-10-CM | POA: Diagnosis not present

## 2022-10-17 DIAGNOSIS — T402X5A Adverse effect of other opioids, initial encounter: Secondary | ICD-10-CM | POA: Diagnosis not present

## 2022-10-17 DIAGNOSIS — D6481 Anemia due to antineoplastic chemotherapy: Secondary | ICD-10-CM | POA: Diagnosis not present

## 2022-10-17 DIAGNOSIS — Z79891 Long term (current) use of opiate analgesic: Secondary | ICD-10-CM | POA: Diagnosis not present

## 2022-10-17 NOTE — Progress Notes (Signed)
[facility] OFFICE PROGRESS NOTE  Patient Care Team: Georgianne Fick, MD as PCP - General (Internal Medicine) Parke Poisson, MD as PCP - Cardiology (Cardiology) Drema Dallas, DO as Consulting Physician (Neurology)  ASSESSMENT & PLAN:  Ovarian cancer [facility]) I have reviewed multiple imaging studies with the patient and family She has new evidence of metastatic disease to her bone She has stable disease on 8 cycles of carboplatin and paclitaxel Overall, she is not a surgical candidate We will reach out to her surgeon on Monday to see if her appointment can be canceled I explained to the patient and family why surgery or radiation therapy are not offered as treatment options Given her primary refractory nature of her disease, her future prognosis is not good Any other treatment options estimated benefit to be around 20% and the patient is quite frail and I am concerned about her wellbeing that we will be trading of quality of life for minimum benefit of extension of life with chemotherapy At the end of the day, she is undecided We will call her next week for further follow-up  Goals of care, counseling/discussion We have extensive goals of care discussion today The patient has chronic pain syndrome with poor quality of life She required significant dose adjustment during treatment I am concerned about her quality of life with more systemic chemotherapy At the end of the discussion, she is undecided We discussed prognosis with and without treatment  No orders of the defined types were placed in this encounter.   All questions were answered. The patient knows to call the clinic with any problems, questions or concerns. The total time spent in the appointment was 40 minutes encounter with patients including review of chart and various tests results, discussions about plan of care and coordination of care plan   Artis Delay, MD 10/17/2022 2:33 PM  INTERVAL  HISTORY: Please see below for problem oriented charting. she returns for treatment follow-up with her son and daughter-in-law We spent majority of our time reviewing test results She has persistent chronic back pain and neuropathy from treatment  REVIEW OF SYSTEMS:   Constitutional: Denies fevers, chills or abnormal weight loss Eyes: Denies blurriness of vision Ears, nose, mouth, throat, and face: Denies mucositis or sore throat Respiratory: Denies cough, dyspnea or wheezes Cardiovascular: Denies palpitation, chest discomfort or lower extremity swelling Gastrointestinal:  Denies nausea, heartburn or change in bowel habits Skin: Denies abnormal skin rashes Lymphatics: Denies new lymphadenopathy or easy bruising Behavioral/Psych: Mood is stable, no new changes  All other systems were reviewed with the patient and are negative.  I have reviewed the past medical history, past surgical history, social history and family history with the patient and they are unchanged from previous note.  ALLERGIES:  has No Known Allergies.  MEDICATIONS:  Current Outpatient Medications  Medication Sig Dispense Refill   aspirin-acetaminophen-caffeine (EXCEDRIN MIGRAINE) 250-250-65 MG tablet Take 1 tablet by mouth every 6 (six) hours as needed for headache or migraine.     Cholecalciferol (VITAMIN D) 50 MCG (2000 UT) CAPS Take 2,000 Units by mouth daily.     clonazePAM (KLONOPIN) 0.5 MG tablet TAKE 1 TABLET BY MOUTH AT BEDTIME AS NEEDED FOR ANXIETY 30 tablet 2   cyanocobalamin 1000 MCG tablet 1,000 mcg daily.     dexamethasone (DECADRON) 4 MG tablet Take 2 tabs at the night before and 2 tab the morning of chemotherapy, every 3 weeks, by mouth x 6 cycles 24 tablet 6  DULoxetine (CYMBALTA) 60 MG capsule TAKE 1 CAPSULE BY MOUTH EVERY DAY 90 capsule 3   fentaNYL (DURAGESIC) 25 MCG/HR Place 1 patch onto the skin every 3 (three) days. 10 patch 0   gabapentin (NEURONTIN) 600 MG tablet TAKE 1 TABLET BY MOUTH FOUR  TIMES A DAY 360 tablet 1   irbesartan (AVAPRO) 150 MG tablet Take 150 mg by mouth daily.     lactulose (CHRONULAC) 10 GM/15ML solution TAKE 15 MLS (10 G TOTAL) BY MOUTH 3 (THREE) TIMES DAILY. 473 mL 1   magnesium oxide (MAG-OX) 400 (240 Mg) MG tablet TAKE 1 TABLET BY MOUTH EVERY DAY 30 tablet 1   Melatonin 10 MG TABS Take 10 mg by mouth at bedtime as needed (sleep).     methimazole (TAPAZOLE) 5 MG tablet Take 5 mg by mouth. 3 times a week only     ondansetron (ZOFRAN) 8 MG tablet Take 1 tablet (8 mg total) by mouth every 8 (eight) hours as needed for nausea or vomiting. Start on the third day after carboplatin. 30 tablet 1   oxyCODONE-acetaminophen (PERCOCET) 10-325 MG tablet Take 1 tablet by mouth 5 (five) times daily as needed for pain. 130 tablet 0   polyethylene glycol (MIRALAX / GLYCOLAX) 17 g packet Take 17 g by mouth 2 (two) times daily.     prochlorperazine (COMPAZINE) 10 MG tablet Take 1 tablet (10 mg total) by mouth every 6 (six) hours as needed for nausea or vomiting. 30 tablet 1   rosuvastatin (CRESTOR) 40 MG tablet Take 1 tablet (40 mg total) by mouth at bedtime. 90 tablet 3   senna (SENOKOT) 8.6 MG tablet Take 2 tablets by mouth 2 (two) times daily.     traZODone (DESYREL) 100 MG tablet TAKE 1 TABLET BY MOUTH EVERYDAY AT BEDTIME 90 tablet 2   No current facility-administered medications for this visit.    SUMMARY OF ONCOLOGIC HISTORY: Oncology History Overview Note  High grade serous, neg genetics P53 mutated, MSI stable, low TMB, HRD not detected Primary refractory disease to carboplatin/paclitaxel   Ovarian cancer (HCC)  03/17/2022 Imaging   CT Abdomen/Pelvis: IMPRESSION: 1. Heterogeneous right adnexal mass measuring 3.8 x 3.0 cm with associated soft tissue thickening along the peritoneal reflections and greater omentum. Findings are highly suspicious for ovarian neoplasm with peritoneal carcinomatosis. 2. No evidence of bowel obstruction. 3. Coronary artery  calcifications.   03/18/2022 Tumor Marker   CA125: 163   04/08/2022 Surgery   Diagnostic laparoscopy with peritoneal and omental biopsies  Findings: On entry to abdomen, omentum and large bowel broadly and densely adherent to the anterior abdominal wall in the midline from the level of the falciform ligament superiorly to the bladder inferiorly. Evidence of peritoneal plaques consistent with peritoneal carcinomatosis on the left diaphragm and left anterior abdominal wall and pelvic peritoneum. Right diaphragm and liver limited in visualization but no evidence of disease. Left liver lobe and stomach normal. Right abdomen not visualized due to the midline adhesions. Pelvic mass not visualized due to adhesions. Omentum adherent to the anterior abdominal wall consistent with omental cake. Small bowel and mesentery that is able to be visualized does not appear to have disease. Initial peritoneal biopsy indeterminate for carcinoma on frozen. Additional biopsies obtained.    04/08/2022 Initial Diagnosis   Ovarian cancer (HCC)   04/08/2022 Pathology Results   A. PERITONEUM, BIOPSY:  Microscopic focus of adenocarcinoma.  See comment.   B. PERITONEUM, #2, BIOPSY:  Microscopic focus of adenocarcinoma.   C. OMENTUM, BIOPSY:  Adenocarcinoma.  See comment.   COMMENT:  A. The permanent recuts of the peritoneal biopsy submitted for frozen  section show a microscopic focus of adenocarcinoma which is not  identified in the frozen section slides.   C.  The omental specimen shows adenocarcinoma and immunohistochemistry  will be performed and reported as an addendum.  The morphologic features  favor an ovarian primary.   ADDENDUM:  Immunohistochemistry shows the carcinoma is positive with PAX8 and p16 with patchy positive staining with WT1 and estrogen receptor.  Progesterone receptor is negative.  P53 shows diffuse strong positivity (mutated pattern).  The morphology and immunophenotype are consistent  with high-grade serous carcinoma.    04/08/2022 Pathology Results   CYTOLOGY FINAL MICROSCOPIC DIAGNOSIS:  - Malignant cells consistent with adenocarcinoma  - See comment    04/30/2022 - 09/26/2022 Chemotherapy   Patient is on Treatment Plan : OVARIAN Carboplatin (AUC 6) + Paclitaxel (175) q21d X 6 Cycles     05/02/2022 Tumor Marker   Patient's tumor was tested for the following markers: CA-125. Results of the tumor marker test revealed 193.   05/26/2022 Procedure   Technically successful right IJ power-injectable port catheter placement. Ready for routine use.     05/30/2022 Tumor Marker   Patient's tumor was tested for the following markers: CA-125. Results of the tumor marker test revealed 93.5.   07/04/2022 Genetic Testing   Negative [facility] Multi-Cancer +RNA Panel.  VUS in ALK at c.2239G>A (p.Gly747Arg).  Report date is 07/04/2022.   The Multi-Cancer + RNA Panel offered by [facility] includes sequencing and/or deletion/duplication analysis of the following 70 genes:  AIP*, ALK, APC*, ATM*, AXIN2*, BAP1*, BARD1*, BLM*, BMPR1A*, BRCA1*, BRCA2*, BRIP1*, CDC73*, CDH1*, CDK4, CDKN1B*, CDKN2A, CHEK2*, CTNNA1*, DICER1*, EPCAM (del/dup only), EGFR, FH*, FLCN*, GREM1 (promoter dup only), HOXB13, KIT, LZTR1, MAX*, MBD4, MEN1*, MET, MITF, MLH1*, MSH2*, MSH3*, MSH6*, MUTYH*, NF1*, NF2*, NTHL1*, PALB2*, PDGFRA, PMS2*, POLD1*, POLE*, POT1*, PRKAR1A*, PTCH1*, PTEN*, RAD51C*, RAD51D*, RB1*, RET, SDHA* (sequencing only), SDHAF2*, SDHB*, SDHC*, SDHD*, SMAD4*, SMARCA4*, SMARCB1*, SMARCE1*, STK11*, SUFU*, TMEM127*, TP53*, TSC1*, TSC2*, VHL*. RNA analysis is performed for * genes.    08/04/2022 Imaging   1. Today's study demonstrates stable burden of disease. Specifically, previously noted right ovarian mass appears grossly unchanged, as does the extent of intraperitoneal metastatic disease, as detailed above. 2. Multiple small solid-appearing pulmonary nodules measuring 5 mm or less noted on the prior examination are  unchanged. 3. Probable right upper lobe bronchopneumonia, as above. 4. Aortic atherosclerosis, in addition to left main and three-vessel coronary artery disease. 5. Additional incidental findings, as above.   09/01/2022 Tumor Marker   Patient's tumor was tested for the following markers: CA-125. Results of the tumor marker test revealed 43.4.   09/29/2022 Tumor Marker   Patient's tumor was tested for the following markers: CA-125. Results of the tumor marker test revealed 46.3.   10/16/2022 Cancer Staging   Staging form: Ovary, Fallopian Tube, and Primary Peritoneal Carcinoma, AJCC 8th Edition - Clinical stage from 10/16/2022: FIGO Stage IVB, calculated as Stage IV (ycT3c, cN0, cM1) - Signed by Artis Delay, MD on 10/16/2022 Stage prefix: Post-therapy   10/16/2022 Imaging   CT CHEST ABDOMEN PELVIS WO CONTRAST  Result Date: 10/16/2022 CLINICAL DATA:  Ovarian cancer, assess treatment response. * Tracking Code: BO * EXAM: CT CHEST, ABDOMEN AND PELVIS WITHOUT CONTRAST TECHNIQUE: Multidetector CT imaging of the chest, abdomen and pelvis was performed following the standard protocol without IV contrast. RADIATION DOSE REDUCTION: This exam was performed according  to the departmental dose-optimization program which includes automated exposure control, adjustment of the mA and/or kV according to patient size and/or use of iterative reconstruction technique. COMPARISON:  08/01/2022 CT chest, abdomen and pelvis. FINDINGS: CT CHEST FINDINGS Cardiovascular: Normal heart size. No significant pericardial effusion/thickening. Three-vessel coronary atherosclerosis. Right internal jugular Port-A-Cath terminates in middle third of the SVC. Atherosclerotic nonaneurysmal thoracic aorta. Normal caliber pulmonary arteries. Mediastinum/Nodes: No significant thyroid nodules. Unremarkable esophagus. No pathologically enlarged axillary, mediastinal or hilar lymph nodes, noting limited sensitivity for the detection of hilar  adenopathy on this noncontrast study. Lungs/Pleura: No pneumothorax. No pleural effusion. No acute consolidative airspace disease or lung masses. Several scattered small solid pulmonary nodules in both lungs measuring up to 0.5 cm in the posterior right lower lobe (series 4/image 105) and 0.4 cm in the left lower lobe (series 4/image 123), all stable since 11/10/2014 chest CT and considered benign. No new significant pulmonary nodules. Musculoskeletal: There are 4 scattered small sclerotic lesions throughout the mid to lower thoracic vertebral bodies, all new from 04/18/2022 CT and increased in size and number from 08/01/2022 CT, largest 0.5 cm in the posterior lower T9 vertebral body (series 6/image 106), increased from 0.2 cm on 08/01/2022. Minimal thoracic spondylosis. CT ABDOMEN PELVIS FINDINGS Hepatobiliary: Normal liver with no liver mass. Normal gallbladder with no radiopaque cholelithiasis. No biliary ductal dilatation. Pancreas: Normal, with no mass or duct dilation. Spleen: Normal size. No mass. Adrenals/Urinary Tract: Normal adrenals. Nonobstructing 2 mm lower right and 2 mm interpolar left renal stones. No hydronephrosis. No contour deforming renal masses. Normal bladder. Stomach/Bowel: Normal non-distended stomach. Normal caliber small bowel with no small bowel wall thickening. Appendix not discretely visualized. Normal large bowel with no diverticulosis, large bowel wall thickening or pericolonic fat stranding. Vascular/Lymphatic: Atherosclerotic nonaneurysmal abdominal aorta. No pathologically enlarged lymph nodes in the abdomen or pelvis. Reproductive: Status post hysterectomy, with no abnormal findings at the vaginal cuff. Mildly heterogeneous solid 3.9 x 3.5 cm right adnexal mass (series 2/image 97), previously 3.9 x 3.6 cm, stable. No left adnexal mass. Other: No pneumoperitoneum, ascites or focal fluid collection. Widespread omental caking is not substantially changed, for example 1.6 cm  thickness in the right lower omentum (series 2/image 84), previously 1.6 cm using similar measurement technique, and 0.8 cm thickness in the anterior left upper abdomen (series 2/image 64), previously 0.8 cm. Irregular mild thickening of the peritoneal reflections in the deep pelvis up to 0.6 cm (series 2/image 101), previously 0.7 cm, minimally decreased. Musculoskeletal: Sclerotic 0.7 cm posterior L2 vertebral lesion (series 2/image 68), slightly increased from 0.6 cm. Sclerotic 1.3 cm posterior left L5 lesion (series 2/image 84), increased from 0.8 cm. Chronic moderate L1 vertebral compression fracture. Bilateral posterior spinal fusion L4-5 with bone cage in the L4-5 disc space. Moderate lumbar spondylosis. IMPRESSION: 1. Widespread omental caking is not substantially changed. Irregular mild thickening of the peritoneal reflections in the deep pelvis, minimally decreased. Stable solid 3.9 cm right adnexal mass. 2. Several small sclerotic osseous metastases in the thoracic vertebral bodies, all new from 04/18/2022 CT and increased in size and number from 08/01/2022 CT. 3. Two small sclerotic osseous metastases in the lumbar vertebral bodies, increased from 08/01/2022 CT and new from 03/17/2022 CT. 4. Three-vessel coronary atherosclerosis. 5. Nonobstructing bilateral nephrolithiasis. 6.  Aortic Atherosclerosis (ICD10-I70.0). Electronically Signed   By: Delbert Phenix M.D.   On: 10/16/2022 10:23        PHYSICAL EXAMINATION: ECOG PERFORMANCE STATUS: 1 - Symptomatic but completely ambulatory  Vitals:   10/17/22 1053  BP: 130/78  Pulse: 74  Resp: 18  Temp: (!) 97.4 F (36.3 C)  SpO2: 96%   Filed Weights   10/17/22 1053  Weight: 132 lb 6.4 oz (60.1 kg)    GENERAL:alert, no distress and comfortable NEURO: alert & oriented x 3 with fluent speech, no focal motor/sensory deficits  LABORATORY DATA:  I have reviewed the data as listed    Component Value Date/Time   NA 138 09/26/2022 0809   NA 139  09/19/2015 1422   K 4.2 09/26/2022 0809   CL 103 09/26/2022 0809   CO2 28 09/26/2022 0809   GLUCOSE 174 (H) 09/26/2022 0809   BUN 20 09/26/2022 0809   BUN 20 09/19/2015 1422   CREATININE 0.53 09/26/2022 0809   CALCIUM 9.9 09/26/2022 0809   PROT 6.7 09/26/2022 0809   PROT 7.0 09/19/2015 1422   ALBUMIN 4.0 09/26/2022 0809   ALBUMIN 4.6 09/19/2015 1422   AST 15 09/26/2022 0809   ALT 9 09/26/2022 0809   ALKPHOS 97 09/26/2022 0809   BILITOT 0.6 09/26/2022 0809   GFRNONAA >60 09/26/2022 0809   GFRAA >60 04/20/2018 1452    No results found for: "SPEP", "UPEP"  Lab Results  Component Value Date   WBC 4.5 09/26/2022   NEUTROABS 3.6 09/26/2022   HGB 11.6 (L) 09/26/2022   HCT 35.9 (L) 09/26/2022   MCV 96.8 09/26/2022   PLT 164 09/26/2022      Chemistry      Component Value Date/Time   NA 138 09/26/2022 0809   NA 139 09/19/2015 1422   K 4.2 09/26/2022 0809   CL 103 09/26/2022 0809   CO2 28 09/26/2022 0809   BUN 20 09/26/2022 0809   BUN 20 09/19/2015 1422   CREATININE 0.53 09/26/2022 0809      Component Value Date/Time   CALCIUM 9.9 09/26/2022 0809   ALKPHOS 97 09/26/2022 0809   AST 15 09/26/2022 0809   ALT 9 09/26/2022 0809   BILITOT 0.6 09/26/2022 0809       RADIOGRAPHIC STUDIES: I have reviewed multiple imaging studies with patient and family I have personally reviewed the radiological images as listed and agreed with the findings in the report. CT CHEST ABDOMEN PELVIS WO CONTRAST  Result Date: 10/16/2022 CLINICAL DATA:  Ovarian cancer, assess treatment response. * Tracking Code: BO * EXAM: CT CHEST, ABDOMEN AND PELVIS WITHOUT CONTRAST TECHNIQUE: Multidetector CT imaging of the chest, abdomen and pelvis was performed following the standard protocol without IV contrast. RADIATION DOSE REDUCTION: This exam was performed according to the departmental dose-optimization program which includes automated exposure control, adjustment of the mA and/or kV according to patient  size and/or use of iterative reconstruction technique. COMPARISON:  08/01/2022 CT chest, abdomen and pelvis. FINDINGS: CT CHEST FINDINGS Cardiovascular: Normal heart size. No significant pericardial effusion/thickening. Three-vessel coronary atherosclerosis. Right internal jugular Port-A-Cath terminates in middle third of the SVC. Atherosclerotic nonaneurysmal thoracic aorta. Normal caliber pulmonary arteries. Mediastinum/Nodes: No significant thyroid nodules. Unremarkable esophagus. No pathologically enlarged axillary, mediastinal or hilar lymph nodes, noting limited sensitivity for the detection of hilar adenopathy on this noncontrast study. Lungs/Pleura: No pneumothorax. No pleural effusion. No acute consolidative airspace disease or lung masses. Several scattered small solid pulmonary nodules in both lungs measuring up to 0.5 cm in the posterior right lower lobe (series 4/image 105) and 0.4 cm in the left lower lobe (series 4/image 123), all stable since 11/10/2014 chest CT and considered benign. No new significant pulmonary  nodules. Musculoskeletal: There are 4 scattered small sclerotic lesions throughout the mid to lower thoracic vertebral bodies, all new from 04/18/2022 CT and increased in size and number from 08/01/2022 CT, largest 0.5 cm in the posterior lower T9 vertebral body (series 6/image 106), increased from 0.2 cm on 08/01/2022. Minimal thoracic spondylosis. CT ABDOMEN PELVIS FINDINGS Hepatobiliary: Normal liver with no liver mass. Normal gallbladder with no radiopaque cholelithiasis. No biliary ductal dilatation. Pancreas: Normal, with no mass or duct dilation. Spleen: Normal size. No mass. Adrenals/Urinary Tract: Normal adrenals. Nonobstructing 2 mm lower right and 2 mm interpolar left renal stones. No hydronephrosis. No contour deforming renal masses. Normal bladder. Stomach/Bowel: Normal non-distended stomach. Normal caliber small bowel with no small bowel wall thickening. Appendix not discretely  visualized. Normal large bowel with no diverticulosis, large bowel wall thickening or pericolonic fat stranding. Vascular/Lymphatic: Atherosclerotic nonaneurysmal abdominal aorta. No pathologically enlarged lymph nodes in the abdomen or pelvis. Reproductive: Status post hysterectomy, with no abnormal findings at the vaginal cuff. Mildly heterogeneous solid 3.9 x 3.5 cm right adnexal mass (series 2/image 97), previously 3.9 x 3.6 cm, stable. No left adnexal mass. Other: No pneumoperitoneum, ascites or focal fluid collection. Widespread omental caking is not substantially changed, for example 1.6 cm thickness in the right lower omentum (series 2/image 84), previously 1.6 cm using similar measurement technique, and 0.8 cm thickness in the anterior left upper abdomen (series 2/image 64), previously 0.8 cm. Irregular mild thickening of the peritoneal reflections in the deep pelvis up to 0.6 cm (series 2/image 101), previously 0.7 cm, minimally decreased. Musculoskeletal: Sclerotic 0.7 cm posterior L2 vertebral lesion (series 2/image 68), slightly increased from 0.6 cm. Sclerotic 1.3 cm posterior left L5 lesion (series 2/image 84), increased from 0.8 cm. Chronic moderate L1 vertebral compression fracture. Bilateral posterior spinal fusion L4-5 with bone cage in the L4-5 disc space. Moderate lumbar spondylosis. IMPRESSION: 1. Widespread omental caking is not substantially changed. Irregular mild thickening of the peritoneal reflections in the deep pelvis, minimally decreased. Stable solid 3.9 cm right adnexal mass. 2. Several small sclerotic osseous metastases in the thoracic vertebral bodies, all new from 04/18/2022 CT and increased in size and number from 08/01/2022 CT. 3. Two small sclerotic osseous metastases in the lumbar vertebral bodies, increased from 08/01/2022 CT and new from 03/17/2022 CT. 4. Three-vessel coronary atherosclerosis. 5. Nonobstructing bilateral nephrolithiasis. 6.  Aortic Atherosclerosis  (ICD10-I70.0). Electronically Signed   By: Delbert Phenix M.D.   On: 10/16/2022 10:23

## 2022-10-17 NOTE — Telephone Encounter (Signed)
Mrs Bulluck called and would like to receive a call from [place]. It is in reference to her cancer. I have let her know that Riley Lam is out of the office until Tuesday 10/21/22. She said she understands and that is ok

## 2022-10-17 NOTE — Assessment & Plan Note (Signed)
We have extensive goals of care discussion today The patient has chronic pain syndrome with poor quality of life She required significant dose adjustment during treatment I am concerned about her quality of life with more systemic chemotherapy At the end of the discussion, she is undecided We discussed prognosis with and without treatment

## 2022-10-17 NOTE — Assessment & Plan Note (Signed)
I have reviewed multiple imaging studies with the patient and family She has new evidence of metastatic disease to her bone She has stable disease on 8 cycles of carboplatin and paclitaxel Overall, she is not a surgical candidate We will reach out to her surgeon on Monday to see if her appointment can be canceled I explained to the patient and family why surgery or radiation therapy are not offered as treatment options Given her primary refractory nature of her disease, her future prognosis is not good Any other treatment options estimated benefit to be around 20% and the patient is quite frail and I am concerned about her wellbeing that we will be trading of quality of life for minimum benefit of extension of life with chemotherapy At the end of the day, she is undecided We will call her next week for further follow-up

## 2022-10-20 ENCOUNTER — Encounter: Payer: Self-pay | Admitting: General Practice

## 2022-10-20 ENCOUNTER — Telehealth: Payer: Self-pay | Admitting: Oncology

## 2022-10-20 ENCOUNTER — Inpatient Hospital Stay (HOSPITAL_BASED_OUTPATIENT_CLINIC_OR_DEPARTMENT_OTHER): Payer: Medicare HMO | Admitting: Psychiatry

## 2022-10-20 VITALS — BP 127/85 | HR 89 | Temp 98.4°F | Resp 17 | Wt 132.0 lb

## 2022-10-20 DIAGNOSIS — Z5111 Encounter for antineoplastic chemotherapy: Secondary | ICD-10-CM | POA: Diagnosis not present

## 2022-10-20 DIAGNOSIS — G894 Chronic pain syndrome: Secondary | ICD-10-CM | POA: Diagnosis not present

## 2022-10-20 DIAGNOSIS — C7951 Secondary malignant neoplasm of bone: Secondary | ICD-10-CM

## 2022-10-20 DIAGNOSIS — C563 Malignant neoplasm of bilateral ovaries: Secondary | ICD-10-CM

## 2022-10-20 DIAGNOSIS — G893 Neoplasm related pain (acute) (chronic): Secondary | ICD-10-CM | POA: Diagnosis not present

## 2022-10-20 DIAGNOSIS — C569 Malignant neoplasm of unspecified ovary: Secondary | ICD-10-CM | POA: Diagnosis not present

## 2022-10-20 DIAGNOSIS — I2510 Atherosclerotic heart disease of native coronary artery without angina pectoris: Secondary | ICD-10-CM | POA: Diagnosis not present

## 2022-10-20 DIAGNOSIS — M4726 Other spondylosis with radiculopathy, lumbar region: Secondary | ICD-10-CM | POA: Diagnosis not present

## 2022-10-20 DIAGNOSIS — C786 Secondary malignant neoplasm of retroperitoneum and peritoneum: Secondary | ICD-10-CM | POA: Diagnosis not present

## 2022-10-20 DIAGNOSIS — K5903 Drug induced constipation: Secondary | ICD-10-CM | POA: Diagnosis not present

## 2022-10-20 DIAGNOSIS — Z79891 Long term (current) use of opiate analgesic: Secondary | ICD-10-CM | POA: Diagnosis not present

## 2022-10-20 DIAGNOSIS — D6481 Anemia due to antineoplastic chemotherapy: Secondary | ICD-10-CM | POA: Diagnosis not present

## 2022-10-20 DIAGNOSIS — T402X5A Adverse effect of other opioids, initial encounter: Secondary | ICD-10-CM | POA: Diagnosis not present

## 2022-10-20 DIAGNOSIS — C561 Malignant neoplasm of right ovary: Secondary | ICD-10-CM

## 2022-10-20 DIAGNOSIS — T451X5A Adverse effect of antineoplastic and immunosuppressive drugs, initial encounter: Secondary | ICD-10-CM | POA: Diagnosis not present

## 2022-10-20 NOTE — Patient Instructions (Signed)
It was a pleasure to see you in clinic today. - We discussed that surgery is not indicated and will not likely be a part of your cancer management in the future based on your recent scans. - You should get a call in the next few days from Dr. Maxine Glenn office to schedule follow-up.  Thank you very much for allowing me to provide care for you today.  I appreciate your confidence in choosing our Gynecologic Oncology team at [facility].  If you have any questions about your visit today please call our office or send [place] a MyChart message and we will get back to you as soon as possible.

## 2022-10-20 NOTE — Progress Notes (Unsigned)
Gynecologic Oncology Return Clinic Visit  Date of Service: 10/20/2022 Referring Provider: Vivien Rossetti, MD   Assessment & Plan: KARINDA VIGNE is a 82 y.o. 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 And now s/p 4C of carb/tax for neoadjuvant chemotherapy.  Ovarian cancer: -Interval imaging reviewed and overall with persistent extensive omental cake, carcinomatosis, overall stable burden of disease. - Imaging with suspicion for right upper lobe bronchopneumonia.  Patient is asymptomatic from this regard. - Reviewed goal of debulking surgery to be able to remove all visible disease.  Based on overall stable burden of disease on imaging combined with extensive disease noted intraoperatively previously with extensive adhesions of the bowel to the abdominal wall, do not recommend interval debulking at this time.  Feel that this would likely be a suboptimal surgery.  Recommend continued adjuvant treatment for 3 additional cycles and interval imaging again. - Patient will continue with Dr. Bertis Ruddy at this time with chemotherapy. - Will get CA125 today to assess ongoing response. -Prior germline testing with no pathogenic variants identified; ALK VUS noted. - Given advanced ovarian cancer, negative germline testing, recommend next generation sequencing with HRD to evaluate for response to PARP inhibitor.  ***wants to consider additional treatment May need RT in the future for back if worse/more symptoms  RTC after additional chemo, repeat imaging for consideration of debulk.  Clide Cliff, MD Gynecologic Oncology   Medical Decision Making I personally spent  TOTAL *** minutes face-to-face and non-face-to-face in the care of this patient, which includes all pre,  intra, and post visit time on the date of service.  ----------------------- Reason for Visit: Treatment counseling  Treatment History: Oncology History Overview Note  High grade serous, neg genetics P53 mutated, MSI stable, low TMB, HRD not detected Primary refractory disease to carboplatin/paclitaxel   Ovarian cancer (HCC)  03/17/2022 Imaging   CT Abdomen/Pelvis: IMPRESSION: 1. Heterogeneous right adnexal mass measuring 3.8 x 3.0 cm with associated soft tissue thickening along the peritoneal reflections and greater omentum. Findings are highly suspicious for ovarian neoplasm with peritoneal carcinomatosis. 2. No evidence of bowel obstruction. 3. Coronary artery calcifications.   03/18/2022 Tumor Marker   CA125: 163   04/08/2022 Surgery   Diagnostic laparoscopy with peritoneal and omental biopsies  Findings: On entry to abdomen, omentum and large bowel broadly and densely adherent to the anterior abdominal wall in the midline from the level of the falciform ligament superiorly to the bladder inferiorly. Evidence of peritoneal plaques consistent with peritoneal carcinomatosis on the left diaphragm and left anterior abdominal wall and pelvic peritoneum. Right diaphragm and liver limited in visualization but no evidence of disease. Left liver lobe and stomach normal. Right abdomen not visualized due to the midline adhesions. Pelvic mass not visualized due to adhesions. Omentum adherent to the anterior abdominal wall consistent with omental cake. Small bowel and mesentery that is able to be visualized does not appear to have disease. Initial peritoneal biopsy indeterminate for carcinoma on frozen. Additional biopsies obtained.    04/08/2022 Initial Diagnosis   Ovarian cancer (HCC)   04/08/2022 Pathology Results   A. PERITONEUM, BIOPSY:  Microscopic focus of adenocarcinoma.  See comment.   B. PERITONEUM, #2, BIOPSY:  Microscopic focus of adenocarcinoma.   C. OMENTUM, BIOPSY:   Adenocarcinoma.  See comment.   COMMENT:  A. The  permanent recuts of the peritoneal biopsy submitted for frozen  section show a microscopic focus of adenocarcinoma which is not  identified in the frozen section slides.   C.  The omental specimen shows adenocarcinoma and immunohistochemistry  will be performed and reported as an addendum.  The morphologic features  favor an ovarian primary.   ADDENDUM:  Immunohistochemistry shows the carcinoma is positive with PAX8 and p16 with patchy positive staining with WT1 and estrogen receptor.  Progesterone receptor is negative.  P53 shows diffuse strong positivity (mutated pattern).  The morphology and immunophenotype are consistent with high-grade serous carcinoma.    04/08/2022 Pathology Results   CYTOLOGY FINAL MICROSCOPIC DIAGNOSIS:  - Malignant cells consistent with adenocarcinoma  - See comment    04/30/2022 - 09/26/2022 Chemotherapy   Patient is on Treatment Plan : OVARIAN Carboplatin (AUC 6) + Paclitaxel (175) q21d X 6 Cycles     05/02/2022 Tumor Marker   Patient's tumor was tested for the following markers: CA-125. Results of the tumor marker test revealed 193.   05/26/2022 Procedure   Technically successful right IJ power-injectable port catheter placement. Ready for routine use.     05/30/2022 Tumor Marker   Patient's tumor was tested for the following markers: CA-125. Results of the tumor marker test revealed 93.5.   07/04/2022 Genetic Testing   Negative [facility] Multi-Cancer +RNA Panel.  VUS in ALK at c.2239G>A (p.Gly747Arg).  Report date is 07/04/2022.   The Multi-Cancer + RNA Panel offered by [facility] includes sequencing and/or deletion/duplication analysis of the following 70 genes:  AIP*, ALK, APC*, ATM*, AXIN2*, BAP1*, BARD1*, BLM*, BMPR1A*, BRCA1*, BRCA2*, BRIP1*, CDC73*, CDH1*, CDK4, CDKN1B*, CDKN2A, CHEK2*, CTNNA1*, DICER1*, EPCAM (del/dup only), EGFR, FH*, FLCN*, GREM1 (promoter dup only), HOXB13, KIT, LZTR1, MAX*, MBD4,  MEN1*, MET, MITF, MLH1*, MSH2*, MSH3*, MSH6*, MUTYH*, NF1*, NF2*, NTHL1*, PALB2*, PDGFRA, PMS2*, POLD1*, POLE*, POT1*, PRKAR1A*, PTCH1*, PTEN*, RAD51C*, RAD51D*, RB1*, RET, SDHA* (sequencing only), SDHAF2*, SDHB*, SDHC*, SDHD*, SMAD4*, SMARCA4*, SMARCB1*, SMARCE1*, STK11*, SUFU*, TMEM127*, TP53*, TSC1*, TSC2*, VHL*. RNA analysis is performed for * genes.    08/04/2022 Imaging   1. Today's study demonstrates stable burden of disease. Specifically, previously noted right ovarian mass appears grossly unchanged, as does the extent of intraperitoneal metastatic disease, as detailed above. 2. Multiple small solid-appearing pulmonary nodules measuring 5 mm or less noted on the prior examination are unchanged. 3. Probable right upper lobe bronchopneumonia, as above. 4. Aortic atherosclerosis, in addition to left main and three-vessel coronary artery disease. 5. Additional incidental findings, as above.   09/01/2022 Tumor Marker   Patient's tumor was tested for the following markers: CA-125. Results of the tumor marker test revealed 43.4.   09/29/2022 Tumor Marker   Patient's tumor was tested for the following markers: CA-125. Results of the tumor marker test revealed 46.3.   10/16/2022 Cancer Staging   Staging form: Ovary, Fallopian Tube, and Primary Peritoneal Carcinoma, AJCC 8th Edition - Clinical stage from 10/16/2022: FIGO Stage IVB, calculated as Stage IV (ycT3c, cN0, cM1) - Signed by Artis Delay, MD on 10/16/2022 Stage prefix: Post-therapy   10/16/2022 Imaging   CT CHEST ABDOMEN PELVIS WO CONTRAST  Result Date: 10/16/2022 CLINICAL DATA:  Ovarian cancer, assess treatment response. * Tracking Code: BO * EXAM: CT CHEST, ABDOMEN AND PELVIS WITHOUT CONTRAST TECHNIQUE: Multidetector CT imaging of the chest, abdomen and pelvis was performed following the standard protocol without IV contrast. RADIATION DOSE REDUCTION: This exam was performed according to the departmental dose-optimization program which  includes automated exposure control,  adjustment of the mA and/or kV according to patient size and/or use of iterative reconstruction technique. COMPARISON:  08/01/2022 CT chest, abdomen and pelvis. FINDINGS: CT CHEST FINDINGS Cardiovascular: Normal heart size. No significant pericardial effusion/thickening. Three-vessel coronary atherosclerosis. Right internal jugular Port-A-Cath terminates in middle third of the SVC. Atherosclerotic nonaneurysmal thoracic aorta. Normal caliber pulmonary arteries. Mediastinum/Nodes: No significant thyroid nodules. Unremarkable esophagus. No pathologically enlarged axillary, mediastinal or hilar lymph nodes, noting limited sensitivity for the detection of hilar adenopathy on this noncontrast study. Lungs/Pleura: No pneumothorax. No pleural effusion. No acute consolidative airspace disease or lung masses. Several scattered small solid pulmonary nodules in both lungs measuring up to 0.5 cm in the posterior right lower lobe (series 4/image 105) and 0.4 cm in the left lower lobe (series 4/image 123), all stable since 11/10/2014 chest CT and considered benign. No new significant pulmonary nodules. Musculoskeletal: There are 4 scattered small sclerotic lesions throughout the mid to lower thoracic vertebral bodies, all new from 04/18/2022 CT and increased in size and number from 08/01/2022 CT, largest 0.5 cm in the posterior lower T9 vertebral body (series 6/image 106), increased from 0.2 cm on 08/01/2022. Minimal thoracic spondylosis. CT ABDOMEN PELVIS FINDINGS Hepatobiliary: Normal liver with no liver mass. Normal gallbladder with no radiopaque cholelithiasis. No biliary ductal dilatation. Pancreas: Normal, with no mass or duct dilation. Spleen: Normal size. No mass. Adrenals/Urinary Tract: Normal adrenals. Nonobstructing 2 mm lower right and 2 mm interpolar left renal stones. No hydronephrosis. No contour deforming renal masses. Normal bladder. Stomach/Bowel: Normal non-distended  stomach. Normal caliber small bowel with no small bowel wall thickening. Appendix not discretely visualized. Normal large bowel with no diverticulosis, large bowel wall thickening or pericolonic fat stranding. Vascular/Lymphatic: Atherosclerotic nonaneurysmal abdominal aorta. No pathologically enlarged lymph nodes in the abdomen or pelvis. Reproductive: Status post hysterectomy, with no abnormal findings at the vaginal cuff. Mildly heterogeneous solid 3.9 x 3.5 cm right adnexal mass (series 2/image 97), previously 3.9 x 3.6 cm, stable. No left adnexal mass. Other: No pneumoperitoneum, ascites or focal fluid collection. Widespread omental caking is not substantially changed, for example 1.6 cm thickness in the right lower omentum (series 2/image 84), previously 1.6 cm using similar measurement technique, and 0.8 cm thickness in the anterior left upper abdomen (series 2/image 64), previously 0.8 cm. Irregular mild thickening of the peritoneal reflections in the deep pelvis up to 0.6 cm (series 2/image 101), previously 0.7 cm, minimally decreased. Musculoskeletal: Sclerotic 0.7 cm posterior L2 vertebral lesion (series 2/image 68), slightly increased from 0.6 cm. Sclerotic 1.3 cm posterior left L5 lesion (series 2/image 84), increased from 0.8 cm. Chronic moderate L1 vertebral compression fracture. Bilateral posterior spinal fusion L4-5 with bone cage in the L4-5 disc space. Moderate lumbar spondylosis. IMPRESSION: 1. Widespread omental caking is not substantially changed. Irregular mild thickening of the peritoneal reflections in the deep pelvis, minimally decreased. Stable solid 3.9 cm right adnexal mass. 2. Several small sclerotic osseous metastases in the thoracic vertebral bodies, all new from 04/18/2022 CT and increased in size and number from 08/01/2022 CT. 3. Two small sclerotic osseous metastases in the lumbar vertebral bodies, increased from 08/01/2022 CT and new from 03/17/2022 CT. 4. Three-vessel coronary  atherosclerosis. 5. Nonobstructing bilateral nephrolithiasis. 6.  Aortic Atherosclerosis (ICD10-I70.0). Electronically Signed   By: Delbert Phenix M.D.   On: 10/16/2022 10:23        Interval History: *** Numbness/tingling Right foot - unchanged Pain - unchange - back - treated with meds Eating/drink - good; no nausea/vomiting  BM every 2 days, bowel regimen Fatigue - resting more than half the day; hard to walk a long distance, fatigue Rest helps fatigue Weight stable     ***Patient presents today with family to discuss interval imaging and treatment plan.  She currently denies any shortness of breath, cough, or chest pain.  She is using a walker when out typically but does not need any assistive device at home.  She is only using a wheelchair today at hospital given the distances that she has to go.  She and her family would report that she is in bed about 50% of the day, but she reports this is mostly because there is "not much to do."  She does have fatigue that improves with rest.  She denies any nausea or vomiting and reports she is able to eat and drink without issue.  She has constipation for which she is taking senna 2 tablets daily and having a bowel movement about every 3 days.  She will take MiraLAX on the third day to help.  She denies any bleeding or abdominal/pelvic pain.  Her back pain is overall stable per her report.   Past Medical/Surgical History: Past Medical History:  Diagnosis Date   Abnormality of gait 02/07/2015   Acquired hallux rigidus of left foot 12/17/2020   Acquired unequal leg length on left 08/08/2011   AMS (altered mental status) 12/28/2020   Biceps tendonitis on left 08/18/2018   BPPV (benign paroxysmal positional vertigo) 02/07/2015   Bunion 12/17/2020   Cancer (HCC) 04/25/2022   Cerebrovascular disease    Cervical facet syndrome    Coronary artery disease 04/27/2022   Disorders of sacrum    Enthesopathy of hip region    Essential hypertension  02/07/2015   Generalized anxiety disorder    GERD (gastroesophageal reflux disease)    Headache    History of kidney stones    HLD (hyperlipidemia) 02/07/2015   Low back pain 02/07/2015   Lumbar facet arthropathy 07/05/2014   Lumbar post-laminectomy syndrome 06/11/2011   Lumbar radicular pain 07/29/2016   Lumbar radiculopathy 04/26/2018   Major depressive disorder    Mild cognitive impairment of uncertain or unknown etiology 01/24/2022   Multiple lacunar infarcts    MRI - bilateral basal ganglia and left thalamus   Myoclonus 09/19/2015   Rotator cuff tendonitis, left 06/08/2018   Sacroiliac joint dysfunction 06/11/2011   Sciatic neuritis    Stroke (HCC)    hx TIA   Subcortical infarction    2016 MRI - small chronic subcortical infarct with associated chronic hemosiderin deposition within the right superior frontal gyrus.   Synovitis and tenosynovitis    Therapeutic opioid induced constipation 07/25/2015   Thyroid disease    Transient left leg weakness    Vision abnormalities     Past Surgical History:  Procedure Laterality Date   ABDOMINAL HYSTERECTOMY     Due to fibroids   BACK SURGERY  2011   lumbar Fusion   BLADDER SURGERY  1985   CESAREAN SECTION     x2   EYE SURGERY Bilateral    Cataract surgery with IOL   FOOT SURGERY Right    "just scraped the bone"   IR IMAGING GUIDED PORT INSERTION  05/26/2022   LAPAROSCOPY N/A 04/08/2022   Procedure: LAPAROSCOPY DIAGNOSTIC WITH BIOPSIES;  Surgeon: Clide Cliff, MD;  Location: [facility];  Service: Gynecology;  Laterality: N/A;   RETINAL DETACHMENT SURGERY Right 10/2011   TRANSFORAMINAL LUMBAR INTERBODY FUSION W/ MIS 1 LEVEL  Right 04/26/2018   Procedure: Right Lumbar four-five Minimally invasive Transforaminal lumbar interbody fusion;  Surgeon: Jadene Pierini, MD;  Location: [facility];  Service: Neurosurgery;  Laterality: Right;    Family History  Problem Relation Age of Onset   Diabetes Mother    Heart disease Mother     Stroke Mother    Heart disease Father    Heart disease Brother    Colon cancer Neg Hx    Breast cancer Neg Hx    Ovarian cancer Neg Hx    Endometrial cancer Neg Hx    Pancreatic cancer Neg Hx    Prostate cancer Neg Hx     Social History   Socioeconomic History   Marital status: Widowed    Spouse name: Not on file   Number of children: Not on file   Years of education: 14   Highest education level: Associate degree: academic program  Occupational History   Occupation: Retired  Tobacco Use   Smoking status: Former    Current packs/day: 0.00    Average packs/day: 0.3 packs/day for 15.0 years (3.8 ttl pk-yrs)    Types: Cigarettes    Start date: 03/15/1953    Quit date: 03/15/1968    Years since quitting: 54.6   Smokeless tobacco: Never  Vaping Use   Vaping status: Never Used  Substance and Sexual Activity   Alcohol use: Not Currently    Comment: occasional glass of wine   Drug use: Yes    Frequency: 20.0 times per week    Types: Fentanyl, Hydrocodone   Sexual activity: Not Currently  Other Topics Concern   Not on file  Social History Narrative   Right handed   Drinks caffeine   Condo two story with Engineer, structural   Social Determinants of Health   Financial Resource Strain: Not on file  Food Insecurity: Not on file  Transportation Needs: Not on file  Physical Activity: Not on file  Stress: Not on file  Social Connections: Unknown (07/04/2021)   Received from [facility]   Social Network    Social Network: Not on file    Current Medications:  Current Outpatient Medications:    aspirin-acetaminophen-caffeine (EXCEDRIN MIGRAINE) 250-250-65 MG tablet, Take 1 tablet by mouth every 6 (six) hours as needed for headache or migraine., Disp: , Rfl:    Cholecalciferol (VITAMIN D) 50 MCG (2000 UT) CAPS, Take 2,000 Units by mouth daily., Disp: , Rfl:    clonazePAM (KLONOPIN) 0.5 MG tablet, TAKE 1 TABLET BY MOUTH AT BEDTIME AS NEEDED FOR ANXIETY, Disp: 30 tablet, Rfl: 2    cyanocobalamin 1000 MCG tablet, 1,000 mcg daily., Disp: , Rfl:    dexamethasone (DECADRON) 4 MG tablet, Take 2 tabs at the night before and 2 tab the morning of chemotherapy, every 3 weeks, by mouth x 6 cycles, Disp: 24 tablet, Rfl: 6   DULoxetine (CYMBALTA) 60 MG capsule, TAKE 1 CAPSULE BY MOUTH EVERY DAY, Disp: 90 capsule, Rfl: 3   fentaNYL (DURAGESIC) 25 MCG/HR, Place 1 patch onto the skin every 3 (three) days., Disp: 10 patch, Rfl: 0   gabapentin (NEURONTIN) 600 MG tablet, TAKE 1 TABLET BY MOUTH FOUR TIMES A DAY, Disp: 360 tablet, Rfl: 1   irbesartan (AVAPRO) 150 MG tablet, Take 150 mg by mouth daily., Disp: , Rfl:    lactulose (CHRONULAC) 10 GM/15ML solution, TAKE 15 MLS (10 G TOTAL) BY MOUTH 3 (THREE) TIMES DAILY., Disp: 473 mL, Rfl: 1   magnesium oxide (MAG-OX) 400 (240 Mg) MG  tablet, TAKE 1 TABLET BY MOUTH EVERY DAY, Disp: 30 tablet, Rfl: 1   Melatonin 10 MG TABS, Take 10 mg by mouth at bedtime as needed (sleep)., Disp: , Rfl:    methimazole (TAPAZOLE) 5 MG tablet, Take 5 mg by mouth. 3 times a week only, Disp: , Rfl:    ondansetron (ZOFRAN) 8 MG tablet, Take 1 tablet (8 mg total) by mouth every 8 (eight) hours as needed for nausea or vomiting. Start on the third day after carboplatin., Disp: 30 tablet, Rfl: 1   oxyCODONE-acetaminophen (PERCOCET) 10-325 MG tablet, Take 1 tablet by mouth 5 (five) times daily as needed for pain., Disp: 130 tablet, Rfl: 0   polyethylene glycol (MIRALAX / GLYCOLAX) 17 g packet, Take 17 g by mouth 2 (two) times daily., Disp: , Rfl:    prochlorperazine (COMPAZINE) 10 MG tablet, Take 1 tablet (10 mg total) by mouth every 6 (six) hours as needed for nausea or vomiting., Disp: 30 tablet, Rfl: 1   rosuvastatin (CRESTOR) 40 MG tablet, Take 1 tablet (40 mg total) by mouth at bedtime., Disp: 90 tablet, Rfl: 3   senna (SENOKOT) 8.6 MG tablet, Take 2 tablets by mouth 2 (two) times daily., Disp: , Rfl:    traZODone (DESYREL) 100 MG tablet, TAKE 1 TABLET BY MOUTH EVERYDAY AT  BEDTIME, Disp: 90 tablet, Rfl: 2  Review of Symptoms: Complete 10-system review includes the following: As above  Physical Exam: BP 127/85 (BP Location: Left Arm, Patient Position: Sitting)   Pulse 89   Temp 98.4 F (36.9 C) (Oral)   Resp 17   Wt 132 lb (59.9 kg)   SpO2 97%   BMI 23.38 kg/m  General: Alert, oriented, no acute distress. HEENT: Normocephalic, atraumatic. Neck symmetric without masses. Sclera anicteric.  Chest: Normal work of breathing. Clear to auscultation bilaterally.  . Cardiovascular: Regular rate and rhythm, no murmurs. Extremities: Warm, well perfused.  No edema bilaterally. In wheelchair Skin: No rashes or lesions noted. ***  Laboratory & Radiologic Studies: ***

## 2022-10-20 NOTE — Telephone Encounter (Signed)
Called [facility] and advised that Dr. Alvester Morin agrees that she is not a surgical candidate but would like to see her this afternoon to discuss everything. Avilynn verbalized agreement.  She also wants to let Dr. Bertis Ruddy know that she has decided to continue with chemotherapy.

## 2022-10-20 NOTE — Progress Notes (Signed)
[facility] Spiritual Care Note  Followed up with Alexandria Mclaughlin by phone, learning about the disease progression and her plan to continue chemo for now because it hasn't caused her problems so far, and she is hopeful that it might help give her more time. In particular, she is worried about her son's financial situation until he qualifies for social security in two years. Overall, Alexandria Mclaughlin notes that she is just beginning to process this new information and welcomes a follow-up infusion visit or phone call next week.  Provided reflective listening, normalization of feelings, and prayer per request. Plan to follow up next week for another pastoral check-in.   [address] [place], [place], [facility] Pager [phone] Voicemail [phone]

## 2022-10-21 ENCOUNTER — Telehealth: Payer: Self-pay | Admitting: Oncology

## 2022-10-21 ENCOUNTER — Encounter: Payer: Self-pay | Admitting: Hematology and Oncology

## 2022-10-21 ENCOUNTER — Encounter: Payer: Self-pay | Admitting: Psychiatry

## 2022-10-21 ENCOUNTER — Other Ambulatory Visit: Payer: Self-pay | Admitting: Hematology and Oncology

## 2022-10-21 DIAGNOSIS — C569 Malignant neoplasm of unspecified ovary: Secondary | ICD-10-CM

## 2022-10-21 DIAGNOSIS — I10 Essential (primary) hypertension: Secondary | ICD-10-CM

## 2022-10-21 NOTE — Telephone Encounter (Signed)
I will create a new plan and see her on the day chemo starts Due to the holidays, her chemo will not be this week or next Scheduler will call her

## 2022-10-21 NOTE — Progress Notes (Signed)
DISCONTINUE ON PATHWAY REGIMEN - Ovarian     A cycle is every 21 days:     Paclitaxel      Carboplatin   **Always confirm dose/schedule in your pharmacy ordering system**  REASON: Disease Progression PRIOR TREATMENT: [id number]: Carboplatin AUC=6 + Paclitaxel 175 mg/m2 q21 Days x 2-4 Cycles TREATMENT RESPONSE: Progressive Disease (PD)  START ON PATHWAY REGIMEN - Ovarian     A cycle is every 28 days:     Liposomal doxorubicin   **Always confirm dose/schedule in your pharmacy ordering system**  Patient Characteristics: Recurrent or Progressive Disease, Second Line, Platinum Resistant or < 6 Months Since Last Therapy, Low, Medium, or Unknown Folate Receptor Alpha Expression OR Not a Candidate for Mirvetuximab, HER2 Expression Equivocal/Negative/Unknown (IHC ? 2+) BRCA Mutation Status: Absent Therapeutic Status: Recurrent or Progressive Disease Line of Therapy: Second Line Folate Receptor Alpha (FR?) Expression: Low Mirvetuximab Candidacy: Not a Candidate for Mirvetuximab HER2 Expression Status by IHC: Negative (IHC 0, 1+) Intent of Therapy: Non-Curative / Palliative Intent, Discussed with Patient

## 2022-10-21 NOTE — Telephone Encounter (Signed)
Called Alexandria Mclaughlin and let her know a scheduler will call her to schedule chemo and appointment with Dr. Bertis Ruddy for the week of 11/04/2022.  She verbalized understanding and agreement and did not have any questions.

## 2022-10-21 NOTE — Telephone Encounter (Signed)
2nd call requesting a call back to discuss her cancer Patient called on 8/23 and was informed you were out of office until today. Call back [phone]

## 2022-10-21 NOTE — Telephone Encounter (Signed)
Called Alexandria Mclaughlin and advised her of the echo appointment on 10/24/2022 at [facility] at 10:00 with 9:45 arrival.  Also advised that a scheduler will call her with an appointment to start chemo on 10/31/22.

## 2022-10-22 ENCOUNTER — Other Ambulatory Visit: Payer: Self-pay

## 2022-10-23 NOTE — Telephone Encounter (Signed)
Return Alexandria Mclaughlin,  She reports her oncologist and Dr. Bertis Ruddy told her she only has 6 months to live. Emotional support given, she is receiving counseling at [facility], she also states she has good family support.

## 2022-10-24 ENCOUNTER — Ambulatory Visit (HOSPITAL_COMMUNITY)
Admission: RE | Admit: 2022-10-24 | Discharge: 2022-10-24 | Disposition: A | Discharge status: Home / Self Care | Payer: Medicare HMO | Source: Ambulatory Visit | Attending: Hematology and Oncology | Admitting: Hematology and Oncology

## 2022-10-24 DIAGNOSIS — C569 Malignant neoplasm of unspecified ovary: Secondary | ICD-10-CM | POA: Diagnosis not present

## 2022-10-24 DIAGNOSIS — I10 Essential (primary) hypertension: Secondary | ICD-10-CM | POA: Insufficient documentation

## 2022-10-24 DIAGNOSIS — E785 Hyperlipidemia, unspecified: Secondary | ICD-10-CM | POA: Diagnosis not present

## 2022-10-26 ENCOUNTER — Other Ambulatory Visit: Payer: Self-pay

## 2022-10-26 LAB — ECHOCARDIOGRAM COMPLETE
AR max vel: 1.85 cm2
AV Area VTI: 1.9 cm2
AV Area mean vel: 1.86 cm2
AV Mean grad: 5 mmHg
AV Peak grad: 10.5 mmHg
Ao pk vel: 1.62 m/s
Area-P 1/2: 2.26 cm2
S' Lateral: 2 cm

## 2022-10-27 ENCOUNTER — Other Ambulatory Visit: Payer: Self-pay

## 2022-10-30 ENCOUNTER — Other Ambulatory Visit: Payer: Self-pay

## 2022-10-30 MED FILL — Dexamethasone Sodium Phosphate Inj 100 MG/10ML: INTRAMUSCULAR | Qty: 1 | Status: AC

## 2022-10-31 ENCOUNTER — Inpatient Hospital Stay: Payer: Medicare HMO | Admitting: Hematology and Oncology

## 2022-10-31 ENCOUNTER — Inpatient Hospital Stay: Payer: Medicare HMO | Attending: Psychiatry

## 2022-10-31 ENCOUNTER — Encounter: Payer: Self-pay | Admitting: Hematology and Oncology

## 2022-10-31 ENCOUNTER — Inpatient Hospital Stay: Payer: Medicare HMO

## 2022-10-31 VITALS — BP 156/75 | HR 79 | Resp 18

## 2022-10-31 VITALS — BP 143/77 | HR 70 | Temp 98.6°F | Resp 18 | Ht 63.0 in | Wt 133.4 lb

## 2022-10-31 DIAGNOSIS — C569 Malignant neoplasm of unspecified ovary: Secondary | ICD-10-CM | POA: Insufficient documentation

## 2022-10-31 DIAGNOSIS — T451X5A Adverse effect of antineoplastic and immunosuppressive drugs, initial encounter: Secondary | ICD-10-CM | POA: Insufficient documentation

## 2022-10-31 DIAGNOSIS — C7951 Secondary malignant neoplasm of bone: Secondary | ICD-10-CM | POA: Diagnosis not present

## 2022-10-31 DIAGNOSIS — Z5111 Encounter for antineoplastic chemotherapy: Secondary | ICD-10-CM | POA: Insufficient documentation

## 2022-10-31 DIAGNOSIS — Z79899 Other long term (current) drug therapy: Secondary | ICD-10-CM | POA: Insufficient documentation

## 2022-10-31 DIAGNOSIS — D6481 Anemia due to antineoplastic chemotherapy: Secondary | ICD-10-CM | POA: Insufficient documentation

## 2022-10-31 LAB — CBC WITH DIFFERENTIAL (CANCER CENTER ONLY)
Abs Immature Granulocytes: 0.02 10*3/uL (ref 0.00–0.07)
Basophils Absolute: 0 10*3/uL (ref 0.0–0.1)
Basophils Relative: 0 %
Eosinophils Absolute: 0 10*3/uL (ref 0.0–0.5)
Eosinophils Relative: 0 %
HCT: 35.2 % — ABNORMAL LOW (ref 36.0–46.0)
Hemoglobin: 11.3 g/dL — ABNORMAL LOW (ref 12.0–15.0)
Immature Granulocytes: 0 %
Lymphocytes Relative: 14 %
Lymphs Abs: 0.8 10*3/uL (ref 0.7–4.0)
MCH: 31.1 pg (ref 26.0–34.0)
MCHC: 32.1 g/dL (ref 30.0–36.0)
MCV: 97 fL (ref 80.0–100.0)
Monocytes Absolute: 0.1 10*3/uL (ref 0.1–1.0)
Monocytes Relative: 1 %
Neutro Abs: 4.6 10*3/uL (ref 1.7–7.7)
Neutrophils Relative %: 85 %
Platelet Count: 192 10*3/uL (ref 150–400)
RBC: 3.63 MIL/uL — ABNORMAL LOW (ref 3.87–5.11)
RDW: 14.1 % (ref 11.5–15.5)
WBC Count: 5.5 10*3/uL (ref 4.0–10.5)
nRBC: 0 % (ref 0.0–0.2)

## 2022-10-31 LAB — CMP (CANCER CENTER ONLY)
ALT: 11 U/L (ref 0–44)
AST: 16 U/L (ref 15–41)
Albumin: 4.1 g/dL (ref 3.5–5.0)
Alkaline Phosphatase: 100 U/L (ref 38–126)
Anion gap: 6 (ref 5–15)
BUN: 19 mg/dL (ref 8–23)
CO2: 28 mmol/L (ref 22–32)
Calcium: 10.2 mg/dL (ref 8.9–10.3)
Chloride: 105 mmol/L (ref 98–111)
Creatinine: 0.51 mg/dL (ref 0.44–1.00)
GFR, Estimated: >60 mL/min (ref >60)
Glucose, Bld: 151 mg/dL — ABNORMAL HIGH (ref 70–99)
Potassium: 4.1 mmol/L (ref 3.5–5.1)
Sodium: 139 mmol/L (ref 135–145)
Total Bilirubin: 0.7 mg/dL (ref 0.3–1.2)
Total Protein: 6.9 g/dL (ref 6.5–8.1)

## 2022-10-31 MED ORDER — DEXTROSE 5 % IV SOLN: Freq: Once | INTRAVENOUS | Status: AC

## 2022-10-31 MED ORDER — SODIUM CHLORIDE 0.9% FLUSH
10.0000 mL | INTRAVENOUS | Status: DC | PRN
  Administered 2022-10-31: 10 mL

## 2022-10-31 MED ORDER — DOXORUBICIN HCL LIPOSOMAL CHEMO INJECTION 2 MG/ML
40.0000 mg/m2 | Freq: Once | INTRAVENOUS | Status: AC
  Administered 2022-10-31: 60 mg via INTRAVENOUS
  Filled 2022-10-31: qty 30

## 2022-10-31 MED ORDER — SODIUM CHLORIDE 0.9 % IV SOLN
10.0000 mg | Freq: Once | INTRAVENOUS | Status: AC
  Administered 2022-10-31: 10 mg via INTRAVENOUS
  Filled 2022-10-31: qty 10

## 2022-10-31 MED ORDER — HEPARIN SOD (PORK) LOCK FLUSH 100 UNIT/ML IV SOLN
500.0000 [IU] | Freq: Once | INTRAVENOUS | Status: AC | PRN
  Administered 2022-10-31: 500 [IU]

## 2022-10-31 NOTE — Patient Instructions (Signed)
[facility]  Discharge Instructions: Thank you for choosing [facility] to provide your oncology and hematology care.   If you have a lab appointment with the Cancer Center, please go directly to the Cancer Center and check in at the registration area.   Wear comfortable clothing and clothing appropriate for easy access to any Portacath or PICC line.   We strive to give you quality time with your provider. You may need to reschedule your appointment if you arrive late (15 or more minutes).  Arriving late affects you and other patients whose appointments are after yours.  Also, if you miss three or more appointments without notifying the office, you may be dismissed from the clinic at the provider's discretion.      For prescription refill requests, have your pharmacy contact our office and allow 72 hours for refills to be completed.    Today you received the following chemotherapy and/or immunotherapy agents: Doxorubicin liposomal (Doxil)      To help prevent nausea and vomiting after your treatment, we encourage you to take your nausea medication as directed.  BELOW ARE SYMPTOMS THAT SHOULD BE REPORTED IMMEDIATELY: *FEVER GREATER THAN 100.4 F (38 C) OR HIGHER *CHILLS OR SWEATING *NAUSEA AND VOMITING THAT IS NOT CONTROLLED WITH YOUR NAUSEA MEDICATION *UNUSUAL SHORTNESS OF BREATH *UNUSUAL BRUISING OR BLEEDING *URINARY PROBLEMS (pain or burning when urinating, or frequent urination) *BOWEL PROBLEMS (unusual diarrhea, constipation, pain near the anus) TENDERNESS IN MOUTH AND THROAT WITH OR WITHOUT PRESENCE OF ULCERS (sore throat, sores in mouth, or a toothache) UNUSUAL RASH, SWELLING OR PAIN  UNUSUAL VAGINAL DISCHARGE OR ITCHING   Items with * indicate a potential emergency and should be followed up as soon as possible or go to the Emergency Department if any problems should occur.  Please show the CHEMOTHERAPY ALERT CARD or IMMUNOTHERAPY  ALERT CARD at check-in to the Emergency Department and triage nurse.  Should you have questions after your visit or need to cancel or reschedule your appointment, please contact [facility]  Dept: [phone]  and follow the prompts.  Office hours are 8:00 a.m. to 4:30 p.m. Monday - Friday. Please note that voicemails left after 4:00 p.m. may not be returned until the following business day.  We are closed weekends and major holidays. You have access to a nurse at all times for urgent questions. Please call the main number to the clinic Dept: [phone] and follow the prompts.   For any non-urgent questions, you may also contact your provider using MyChart. We now offer e-Visits for anyone 85 and older to request care online for non-urgent symptoms. For details visit [email or web address].   Also download the MyChart app! Go to the app store, search "MyChart", open the app, select [facility], and log in with your MyChart username and password.  Doxorubicin Liposomal Injection What is this medication? DOXORUBICIN LIPOSOMAL (dox oh ROO bi sin LIP oh som al) treats some types of cancer. It works by slowing down the growth of cancer cells. This medicine may be used for other purposes; ask your health care provider or pharmacist if you have questions. COMMON BRAND NAME(S): Doxil, Lipodox What should I tell my care team before I take this medication? They need to know if you have any of these conditions: Blood disorder Heart disease Infection especially a viral infection, such as chickenpox, cold sores, herpes Liver disease Recent or ongoing radiation An unusual or  allergic reaction to doxorubicin, soybeans, other medications, foods, dyes, or preservatives If you or your partner are pregnant or trying to get pregnant Breast-feeding How should I use this medication? This medication is injected into a vein. It is given by your care team in a hospital or  clinic setting. Talk to your care team about the use of this medication in children. Special care may be needed. Overdosage: If you think you have taken too much of this medicine contact a poison control center or emergency room at once. NOTE: This medicine is only for you. Do not share this medicine with others. What if I miss a dose? Keep appointments for follow-up doses. It is important not to miss your dose. Call your care team if you are unable to keep an appointment. What may interact with this medication? Do not take this medication with any of the following: Zidovudine This medication may also interact with the following: Medications to increase blood counts, such as filgrastim, pegfilgrastim, sargramostim Vaccines This list may not describe all possible interactions. Give your health care provider a list of all the medicines, herbs, non-prescription drugs, or dietary supplements you use. Also tell them if you smoke, drink alcohol, or use illegal drugs. Some items may interact with your medicine. What should I watch for while using this medication? Your condition will be monitored carefully while you are receiving this medication. You may need blood work while taking this medication. This medication may make you feel generally unwell. This is not uncommon as chemotherapy can affect healthy cells as well as cancer cells. Report any side effects. Continue your course of treatment even though you feel ill unless your care team tells you to stop. Your urine may turn orange-red for a few days after your dose. This is not blood. If your urine is dark or brown, call your care team. In some cases, you may be given additional medications to help with side effects. Follow all directions for their use. Talk to your care team about your risk of cancer. You may be more at risk for certain types of cancers if you take this medication. Talk to your care team if you or your partner may be pregnant.  Serious birth defects can occur if you take this medication during pregnancy and for 6 months after the last dose. Contraception is recommended while taking this medication and for 6 months after the last dose. Your care team can help you find the option that works for you. If your partner can get pregnant, use a condom while taking this medication and for 6 months after the last dose. Do not breastfeed while taking this medication. This medication may cause infertility. Talk to your care team if you are concerned about your fertility. What side effects may I notice from receiving this medication? Side effects that you should report to your care team as soon as possible: Allergic reactions--skin rash, itching, hives, swelling of the face, lips, tongue, or throat Heart failure--shortness of breath, swelling of the ankles, feet, or hands, sudden weight gain, unusual weakness or fatigue Infection--fever, chills, cough, sore throat, wounds that don't heal, pain or trouble when passing urine, general feeling of discomfort or being unwell Infusion reactions--chest pain, shortness of breath or trouble breathing, feeling faint or lightheaded Low red blood cell level--unusual weakness or fatigue, dizziness, headache, trouble breathing Redness, swelling, and blistering of the skin over hands and feet Unusual bruising or bleeding Side effects that usually do not require medical attention (report  to your care team if they continue or are bothersome): Constipation Diarrhea Loss of appetite Nausea Pain, redness, or swelling with sores inside the mouth or throat Red urine Unusual weakness or fatigue This list may not describe all possible side effects. Call your doctor for medical advice about side effects. You may report side effects to FDA at 1-800-FDA-1088. Where should I keep my medication? This medication is given in a hospital or clinic. It will not be stored at home. NOTE: This sheet is a summary. It  may not cover all possible information. If you have questions about this medicine, talk to your doctor, pharmacist, or health care provider.  2024 Elsevier/Gold Standard (2022-05-15 00:00:00)

## 2022-10-31 NOTE — Assessment & Plan Note (Signed)
The patient is scheduled to start first dose of liposomal doxorubicin today Recent echocardiogram was reviewed which was within normal range Plan to repeat imaging study every 3 months along with repeat echocardiogram I explained to the patient and family why surgery or radiation therapy are not offered as treatment options I reviewed some of the side effects to be expected with the patient and her family and addressed all her questions

## 2022-10-31 NOTE — Assessment & Plan Note (Signed)
This is likely due to recent treatment. The patient denies recent history of bleeding such as epistaxis, hematuria or hematochezia. She is asymptomatic from the anemia. I will observe for now.  She does not require transfusion now. I will continue the chemotherapy at current dose without dosage adjustment.  If the anemia gets progressive worse in the future, I might have to delay her treatment or adjust the chemotherapy dose.  

## 2022-10-31 NOTE — Progress Notes (Signed)
[facility] OFFICE PROGRESS NOTE  Patient Care Team: Georgianne Fick, MD as PCP - General (Internal Medicine) Parke Poisson, MD as PCP - Cardiology (Cardiology) Drema Dallas, DO as Consulting Physician (Neurology)  ASSESSMENT & PLAN:  Ovarian cancer [facility]) The patient is scheduled to start first dose of liposomal doxorubicin today Recent echocardiogram was reviewed which was within normal range Plan to repeat imaging study every 3 months along with repeat echocardiogram I explained to the patient and family why surgery or radiation therapy are not offered as treatment options I reviewed some of the side effects to be expected with the patient and her family and addressed all her questions  Anemia due to antineoplastic chemotherapy This is likely due to recent treatment. The patient denies recent history of bleeding such as epistaxis, hematuria or hematochezia. She is asymptomatic from the anemia. I will observe for now.  She does not require transfusion now. I will continue the chemotherapy at current dose without dosage adjustment.  If the anemia gets progressive worse in the future, I might have to delay her treatment or adjust the chemotherapy dose.   No orders of the defined types were placed in this encounter.   All questions were answered. The patient knows to call the clinic with any problems, questions or concerns. The total time spent in the appointment was 30 minutes encounter with patients including review of chart and various tests results, discussions about plan of care and coordination of care plan   Artis Delay, MD 10/31/2022 2:41 PM  INTERVAL HISTORY: Please see below for problem oriented charting. she returns for treatment follow-up She is here accompanied by her daughter-in-law I reviewed recent results of echocardiogram with her I reviewed side effects to be expected from liposomal doxorubicin The patient asked questions related to the role of  surgery and radiation today.  I explained to the patient why they are not indicated  REVIEW OF SYSTEMS:   Constitutional: Denies fevers, chills or abnormal weight loss Eyes: Denies blurriness of vision Ears, nose, mouth, throat, and face: Denies mucositis or sore throat Respiratory: Denies cough, dyspnea or wheezes Cardiovascular: Denies palpitation, chest discomfort or lower extremity swelling Gastrointestinal:  Denies nausea, heartburn or change in bowel habits Skin: Denies abnormal skin rashes Lymphatics: Denies new lymphadenopathy or easy bruising Neurological:Denies numbness, tingling or new weaknesses Behavioral/Psych: Mood is stable, no new changes  All other systems were reviewed with the patient and are negative.  I have reviewed the past medical history, past surgical history, social history and family history with the patient and they are unchanged from previous note.  ALLERGIES:  has No Known Allergies.  MEDICATIONS:  Current Outpatient Medications  Medication Sig Dispense Refill   aspirin-acetaminophen-caffeine (EXCEDRIN MIGRAINE) 250-250-65 MG tablet Take 1 tablet by mouth every 6 (six) hours as needed for headache or migraine.     Cholecalciferol (VITAMIN D) 50 MCG (2000 UT) CAPS Take 2,000 Units by mouth daily.     clonazePAM (KLONOPIN) 0.5 MG tablet TAKE 1 TABLET BY MOUTH AT BEDTIME AS NEEDED FOR ANXIETY 30 tablet 2   cyanocobalamin 1000 MCG tablet 1,000 mcg daily.     dexamethasone (DECADRON) 4 MG tablet Take 2 tabs at the night before and 2 tab the morning of chemotherapy, every 3 weeks, by mouth x 6 cycles 24 tablet 6   DULoxetine (CYMBALTA) 60 MG capsule TAKE 1 CAPSULE BY MOUTH EVERY DAY 90 capsule 3   fentaNYL (DURAGESIC) 25 MCG/HR Place 1 patch onto  the skin every 3 (three) days. 10 patch 0   gabapentin (NEURONTIN) 600 MG tablet TAKE 1 TABLET BY MOUTH FOUR TIMES A DAY 360 tablet 1   irbesartan (AVAPRO) 150 MG tablet Take 150 mg by mouth daily.     lactulose  (CHRONULAC) 10 GM/15ML solution TAKE 15 MLS (10 G TOTAL) BY MOUTH 3 (THREE) TIMES DAILY. 473 mL 1   magnesium oxide (MAG-OX) 400 (240 Mg) MG tablet TAKE 1 TABLET BY MOUTH EVERY DAY 30 tablet 1   Melatonin 10 MG TABS Take 10 mg by mouth at bedtime as needed (sleep).     methimazole (TAPAZOLE) 5 MG tablet Take 5 mg by mouth. 3 times a week only     ondansetron (ZOFRAN) 8 MG tablet Take 1 tablet (8 mg total) by mouth every 8 (eight) hours as needed for nausea or vomiting. Start on the third day after carboplatin. 30 tablet 1   oxyCODONE-acetaminophen (PERCOCET) 10-325 MG tablet Take 1 tablet by mouth 5 (five) times daily as needed for pain. 130 tablet 0   polyethylene glycol (MIRALAX / GLYCOLAX) 17 g packet Take 17 g by mouth 2 (two) times daily.     prochlorperazine (COMPAZINE) 10 MG tablet Take 1 tablet (10 mg total) by mouth every 6 (six) hours as needed for nausea or vomiting. 30 tablet 1   rosuvastatin (CRESTOR) 40 MG tablet Take 1 tablet (40 mg total) by mouth at bedtime. 90 tablet 3   senna (SENOKOT) 8.6 MG tablet Take 2 tablets by mouth 2 (two) times daily.     traZODone (DESYREL) 100 MG tablet TAKE 1 TABLET BY MOUTH EVERYDAY AT BEDTIME 90 tablet 2   No current facility-administered medications for this visit.   Facility-Administered Medications Ordered in Other Visits  Medication Dose Route Frequency Provider Last Rate Last Admin   dexamethasone (DECADRON) 10 mg in sodium chloride 0.9 % 50 mL IVPB  10 mg Intravenous Once Bertis Ruddy, Sullivan Jacuinde, MD 204 mL/hr at 10/31/22 1438 10 mg at 10/31/22 1438   DOXOrubicin HCL LIPOSOMAL (DOXIL) 60 mg in dextrose 5 % 250 mL chemo infusion  40 mg/m2 (Treatment Plan Recorded) Intravenous Once Bertis Ruddy, Perlie Stene, MD       heparin lock flush 100 unit/mL  500 Units Intracatheter Once PRN Bertis Ruddy, Jaquavius Hudler, MD       sodium chloride flush (NS) 0.9 % injection 10 mL  10 mL Intracatheter PRN Bertis Ruddy, Arleen Bar, MD        SUMMARY OF ONCOLOGIC HISTORY: Oncology History Overview Note  High  grade serous, neg genetics P53 mutated, MSI stable, low TMB, HRD not detected Primary refractory disease to carboplatin/paclitaxel   Ovarian cancer (HCC)  03/17/2022 Imaging   CT Abdomen/Pelvis: IMPRESSION: 1. Heterogeneous right adnexal mass measuring 3.8 x 3.0 cm with associated soft tissue thickening along the peritoneal reflections and greater omentum. Findings are highly suspicious for ovarian neoplasm with peritoneal carcinomatosis. 2. No evidence of bowel obstruction. 3. Coronary artery calcifications.   03/18/2022 Tumor Marker   CA125: 163   04/08/2022 Surgery   Diagnostic laparoscopy with peritoneal and omental biopsies  Findings: On entry to abdomen, omentum and large bowel broadly and densely adherent to the anterior abdominal wall in the midline from the level of the falciform ligament superiorly to the bladder inferiorly. Evidence of peritoneal plaques consistent with peritoneal carcinomatosis on the left diaphragm and left anterior abdominal wall and pelvic peritoneum. Right diaphragm and liver limited in visualization but no evidence of disease. Left liver lobe and stomach normal.  Right abdomen not visualized due to the midline adhesions. Pelvic mass not visualized due to adhesions. Omentum adherent to the anterior abdominal wall consistent with omental cake. Small bowel and mesentery that is able to be visualized does not appear to have disease. Initial peritoneal biopsy indeterminate for carcinoma on frozen. Additional biopsies obtained.    04/08/2022 Initial Diagnosis   Ovarian cancer (HCC)   04/08/2022 Pathology Results   A. PERITONEUM, BIOPSY:  Microscopic focus of adenocarcinoma.  See comment.   B. PERITONEUM, #2, BIOPSY:  Microscopic focus of adenocarcinoma.   C. OMENTUM, BIOPSY:  Adenocarcinoma.  See comment.   COMMENT:  A. The permanent recuts of the peritoneal biopsy submitted for frozen  section show a microscopic focus of adenocarcinoma which is not   identified in the frozen section slides.   C.  The omental specimen shows adenocarcinoma and immunohistochemistry  will be performed and reported as an addendum.  The morphologic features  favor an ovarian primary.   ADDENDUM:  Immunohistochemistry shows the carcinoma is positive with PAX8 and p16 with patchy positive staining with WT1 and estrogen receptor.  Progesterone receptor is negative.  P53 shows diffuse strong positivity (mutated pattern).  The morphology and immunophenotype are consistent with high-grade serous carcinoma.    04/08/2022 Pathology Results   CYTOLOGY FINAL MICROSCOPIC DIAGNOSIS:  - Malignant cells consistent with adenocarcinoma  - See comment    04/30/2022 - 09/26/2022 Chemotherapy   Patient is on Treatment Plan : OVARIAN Carboplatin (AUC 6) + Paclitaxel (175) q21d X 6 Cycles     05/02/2022 Tumor Marker   Patient's tumor was tested for the following markers: CA-125. Results of the tumor marker test revealed 193.   05/26/2022 Procedure   Technically successful right IJ power-injectable port catheter placement. Ready for routine use.     05/30/2022 Tumor Marker   Patient's tumor was tested for the following markers: CA-125. Results of the tumor marker test revealed 93.5.   07/04/2022 Genetic Testing   Negative [facility] Multi-Cancer +RNA Panel.  VUS in ALK at c.2239G>A (p.Gly747Arg).  Report date is 07/04/2022.   The Multi-Cancer + RNA Panel offered by [facility] includes sequencing and/or deletion/duplication analysis of the following 70 genes:  AIP*, ALK, APC*, ATM*, AXIN2*, BAP1*, BARD1*, BLM*, BMPR1A*, BRCA1*, BRCA2*, BRIP1*, CDC73*, CDH1*, CDK4, CDKN1B*, CDKN2A, CHEK2*, CTNNA1*, DICER1*, EPCAM (del/dup only), EGFR, FH*, FLCN*, GREM1 (promoter dup only), HOXB13, KIT, LZTR1, MAX*, MBD4, MEN1*, MET, MITF, MLH1*, MSH2*, MSH3*, MSH6*, MUTYH*, NF1*, NF2*, NTHL1*, PALB2*, PDGFRA, PMS2*, POLD1*, POLE*, POT1*, PRKAR1A*, PTCH1*, PTEN*, RAD51C*, RAD51D*, RB1*, RET, SDHA*  (sequencing only), SDHAF2*, SDHB*, SDHC*, SDHD*, SMAD4*, SMARCA4*, SMARCB1*, SMARCE1*, STK11*, SUFU*, TMEM127*, TP53*, TSC1*, TSC2*, VHL*. RNA analysis is performed for * genes.    08/04/2022 Imaging   1. Today's study demonstrates stable burden of disease. Specifically, previously noted right ovarian mass appears grossly unchanged, as does the extent of intraperitoneal metastatic disease, as detailed above. 2. Multiple small solid-appearing pulmonary nodules measuring 5 mm or less noted on the prior examination are unchanged. 3. Probable right upper lobe bronchopneumonia, as above. 4. Aortic atherosclerosis, in addition to left main and three-vessel coronary artery disease. 5. Additional incidental findings, as above.   09/01/2022 Tumor Marker   Patient's tumor was tested for the following markers: CA-125. Results of the tumor marker test revealed 43.4.   09/29/2022 Tumor Marker   Patient's tumor was tested for the following markers: CA-125. Results of the tumor marker test revealed 46.3.   10/16/2022 Cancer Staging   Staging form:  Ovary, Fallopian Tube, and Primary Peritoneal Carcinoma, AJCC 8th Edition - Clinical stage from 10/16/2022: FIGO Stage IVB, calculated as Stage IV (ycT3c, cN0, cM1) - Signed by Artis Delay, MD on 10/16/2022 Stage prefix: Post-therapy   10/16/2022 Imaging   CT CHEST ABDOMEN PELVIS WO CONTRAST  Result Date: 10/16/2022 CLINICAL DATA:  Ovarian cancer, assess treatment response. * Tracking Code: BO * EXAM: CT CHEST, ABDOMEN AND PELVIS WITHOUT CONTRAST TECHNIQUE: Multidetector CT imaging of the chest, abdomen and pelvis was performed following the standard protocol without IV contrast. RADIATION DOSE REDUCTION: This exam was performed according to the departmental dose-optimization program which includes automated exposure control, adjustment of the mA and/or kV according to patient size and/or use of iterative reconstruction technique. COMPARISON:  08/01/2022 CT chest,  abdomen and pelvis. FINDINGS: CT CHEST FINDINGS Cardiovascular: Normal heart size. No significant pericardial effusion/thickening. Three-vessel coronary atherosclerosis. Right internal jugular Port-A-Cath terminates in middle third of the SVC. Atherosclerotic nonaneurysmal thoracic aorta. Normal caliber pulmonary arteries. Mediastinum/Nodes: No significant thyroid nodules. Unremarkable esophagus. No pathologically enlarged axillary, mediastinal or hilar lymph nodes, noting limited sensitivity for the detection of hilar adenopathy on this noncontrast study. Lungs/Pleura: No pneumothorax. No pleural effusion. No acute consolidative airspace disease or lung masses. Several scattered small solid pulmonary nodules in both lungs measuring up to 0.5 cm in the posterior right lower lobe (series 4/image 105) and 0.4 cm in the left lower lobe (series 4/image 123), all stable since 11/10/2014 chest CT and considered benign. No new significant pulmonary nodules. Musculoskeletal: There are 4 scattered small sclerotic lesions throughout the mid to lower thoracic vertebral bodies, all new from 04/18/2022 CT and increased in size and number from 08/01/2022 CT, largest 0.5 cm in the posterior lower T9 vertebral body (series 6/image 106), increased from 0.2 cm on 08/01/2022. Minimal thoracic spondylosis. CT ABDOMEN PELVIS FINDINGS Hepatobiliary: Normal liver with no liver mass. Normal gallbladder with no radiopaque cholelithiasis. No biliary ductal dilatation. Pancreas: Normal, with no mass or duct dilation. Spleen: Normal size. No mass. Adrenals/Urinary Tract: Normal adrenals. Nonobstructing 2 mm lower right and 2 mm interpolar left renal stones. No hydronephrosis. No contour deforming renal masses. Normal bladder. Stomach/Bowel: Normal non-distended stomach. Normal caliber small bowel with no small bowel wall thickening. Appendix not discretely visualized. Normal large bowel with no diverticulosis, large bowel wall thickening or  pericolonic fat stranding. Vascular/Lymphatic: Atherosclerotic nonaneurysmal abdominal aorta. No pathologically enlarged lymph nodes in the abdomen or pelvis. Reproductive: Status post hysterectomy, with no abnormal findings at the vaginal cuff. Mildly heterogeneous solid 3.9 x 3.5 cm right adnexal mass (series 2/image 97), previously 3.9 x 3.6 cm, stable. No left adnexal mass. Other: No pneumoperitoneum, ascites or focal fluid collection. Widespread omental caking is not substantially changed, for example 1.6 cm thickness in the right lower omentum (series 2/image 84), previously 1.6 cm using similar measurement technique, and 0.8 cm thickness in the anterior left upper abdomen (series 2/image 64), previously 0.8 cm. Irregular mild thickening of the peritoneal reflections in the deep pelvis up to 0.6 cm (series 2/image 101), previously 0.7 cm, minimally decreased. Musculoskeletal: Sclerotic 0.7 cm posterior L2 vertebral lesion (series 2/image 68), slightly increased from 0.6 cm. Sclerotic 1.3 cm posterior left L5 lesion (series 2/image 84), increased from 0.8 cm. Chronic moderate L1 vertebral compression fracture. Bilateral posterior spinal fusion L4-5 with bone cage in the L4-5 disc space. Moderate lumbar spondylosis. IMPRESSION: 1. Widespread omental caking is not substantially changed. Irregular mild thickening of the peritoneal reflections in the deep pelvis,  minimally decreased. Stable solid 3.9 cm right adnexal mass. 2. Several small sclerotic osseous metastases in the thoracic vertebral bodies, all new from 04/18/2022 CT and increased in size and number from 08/01/2022 CT. 3. Two small sclerotic osseous metastases in the lumbar vertebral bodies, increased from 08/01/2022 CT and new from 03/17/2022 CT. 4. Three-vessel coronary atherosclerosis. 5. Nonobstructing bilateral nephrolithiasis. 6.  Aortic Atherosclerosis (ICD10-I70.0). Electronically Signed   By: Delbert Phenix M.D.   On: 10/16/2022 10:23       10/28/2022 Echocardiogram       1. Left ventricular ejection fraction, by estimation, is 60 to 65%. The left ventricle has normal function. The left ventricle has no regional wall motion abnormalities. There is mild left ventricular hypertrophy. Left ventricular diastolic parameters  are consistent with Grade I diastolic dysfunction (impaired relaxation).  2. Right ventricular systolic function is normal. The right ventricular size is normal.  3. The mitral valve is normal in structure. Trivial mitral valve regurgitation. No evidence of mitral stenosis.  4. The aortic valve is tricuspid. Aortic valve regurgitation is not visualized. Aortic valve sclerosis/calcification is present, without any evidence of aortic stenosis.  5. Aortic dilatation noted. There is mild dilatation of the ascending aorta, measuring 39 mm.     10/31/2022 -  Chemotherapy   Patient is on Treatment Plan : OVARIAN Liposomal Doxorubicin (40) q28d       PHYSICAL EXAMINATION: ECOG PERFORMANCE STATUS: 2 - Symptomatic, <50% confined to bed  Vitals:   10/31/22 1303  BP: (!) 143/77  Pulse: 70  Resp: 18  Temp: 98.6 F (37 C)  SpO2: 96%   Filed Weights   10/31/22 1303  Weight: 133 lb 6.4 oz (60.5 kg)    GENERAL:alert, no distress and comfortable NEURO: alert & oriented x 3 with fluent speech, no focal motor/sensory deficits  LABORATORY DATA:  I have reviewed the data as listed    Component Value Date/Time   NA 139 10/31/2022 1236   NA 139 09/19/2015 1422   K 4.1 10/31/2022 1236   CL 105 10/31/2022 1236   CO2 28 10/31/2022 1236   GLUCOSE 151 (H) 10/31/2022 1236   BUN 19 10/31/2022 1236   BUN 20 09/19/2015 1422   CREATININE 0.51 10/31/2022 1236   CALCIUM 10.2 10/31/2022 1236   PROT 6.9 10/31/2022 1236   PROT 7.0 09/19/2015 1422   ALBUMIN 4.1 10/31/2022 1236   ALBUMIN 4.6 09/19/2015 1422   AST 16 10/31/2022 1236   ALT 11 10/31/2022 1236   ALKPHOS 100 10/31/2022 1236   BILITOT 0.7 10/31/2022 1236    GFRNONAA >60 10/31/2022 1236   GFRAA >60 04/20/2018 1452    No results found for: "SPEP", "UPEP"  Lab Results  Component Value Date   WBC 5.5 10/31/2022   NEUTROABS 4.6 10/31/2022   HGB 11.3 (L) 10/31/2022   HCT 35.2 (L) 10/31/2022   MCV 97.0 10/31/2022   PLT 192 10/31/2022      Chemistry      Component Value Date/Time   NA 139 10/31/2022 1236   NA 139 09/19/2015 1422   K 4.1 10/31/2022 1236   CL 105 10/31/2022 1236   CO2 28 10/31/2022 1236   BUN 19 10/31/2022 1236   BUN 20 09/19/2015 1422   CREATININE 0.51 10/31/2022 1236      Component Value Date/Time   CALCIUM 10.2 10/31/2022 1236   ALKPHOS 100 10/31/2022 1236   AST 16 10/31/2022 1236   ALT 11 10/31/2022 1236   BILITOT 0.7 10/31/2022  1236       RADIOGRAPHIC STUDIES: I have personally reviewed the radiological images as listed and agreed with the findings in the report. ECHOCARDIOGRAM COMPLETE  Result Date: 10/26/2022    ECHOCARDIOGRAM REPORT   Patient Name:   Alexandria Mclaughlin Date of Exam: 10/24/2022 Medical Rec #:  [id number]   Height:       63.0 in Accession #:    [id number]  Weight:       132.0 lb Date of Birth:  [birth year]   BSA:          1.621 m Patient Age:    82 years    BP:           127/85 mmHg Patient Gender: F           HR:           64 bpm. Exam Location:  Outpatient Procedure: 2D Echo, Color Doppler, Cardiac Doppler and Strain Analysis Indications:    Chemotherapy  History:        Patient has prior history of Echocardiogram examinations, most                 recent 06/16/2022. Ovarian cancer with chemotherapy; Risk                 Factors:Hypertension and Dyslipidemia.  Sonographer:    [employer] Referring Phys: [id number] Gemma Ruan  Sonographer Comments: Global longitudinal strain was attempted. IMPRESSIONS  1. Left ventricular ejection fraction, by estimation, is 60 to 65%. The left ventricle has normal function. The left ventricle has no regional wall motion abnormalities. There is mild left ventricular hypertrophy. Left  ventricular diastolic parameters are consistent with Grade I diastolic dysfunction (impaired relaxation).  2. Right ventricular systolic function is normal. The right ventricular size is normal.  3. The mitral valve is normal in structure. Trivial mitral valve regurgitation. No evidence of mitral stenosis.  4. The aortic valve is tricuspid. Aortic valve regurgitation is not visualized. Aortic valve sclerosis/calcification is present, without any evidence of aortic stenosis.  5. Aortic dilatation noted. There is mild dilatation of the ascending aorta, measuring 39 mm. FINDINGS  Left Ventricle: Left ventricular ejection fraction, by estimation, is 60 to 65%. The left ventricle has normal function. The left ventricle has no regional wall motion abnormalities. The left ventricular internal cavity size was small. There is mild left ventricular hypertrophy. Left ventricular diastolic parameters are consistent with Grade I diastolic dysfunction (impaired relaxation). Right Ventricle: The right ventricular size is normal. No increase in right ventricular wall thickness. Right ventricular systolic function is normal. Left Atrium: Left atrial size was normal in size. Right Atrium: Right atrial size was normal in size. Pericardium: There is no evidence of pericardial effusion. Mitral Valve: The mitral valve is normal in structure. Trivial mitral valve regurgitation. No evidence of mitral valve stenosis. Tricuspid Valve: The tricuspid valve is normal in structure. Tricuspid valve regurgitation is trivial. Aortic Valve: The aortic valve is tricuspid. Aortic valve regurgitation is not visualized. Aortic valve sclerosis/calcification is present, without any evidence of aortic stenosis. Aortic valve mean gradient measures 5.0 mmHg. Aortic valve peak gradient measures 10.5 mmHg. Aortic valve area, by VTI measures 1.90 cm. Pulmonic Valve: The pulmonic valve was not well visualized. Pulmonic valve regurgitation is trivial. Aorta: The  aortic root is normal in size and structure and aortic dilatation noted. There is mild dilatation of the ascending aorta, measuring 39 mm. IAS/Shunts: The interatrial septum was not well visualized.  LEFT VENTRICLE  PLAX 2D LVIDd:         3.20 cm   Diastology LVIDs:         2.00 cm   LV e' medial:    5.33 cm/s LV PW:         1.10 cm   LV E/e' medial:  12.3 LV IVS:        1.00 cm   LV e' lateral:   9.90 cm/s LVOT diam:     1.60 cm   LV E/e' lateral: 6.6 LV SV:         62 LV SV Index:   38 LVOT Area:     2.01 cm  RIGHT VENTRICLE RV Basal diam:  2.80 cm RV Mid diam:    2.30 cm RV S prime:     12.20 cm/s TAPSE (M-mode): 1.6 cm LEFT ATRIUM             Index        RIGHT ATRIUM           Index LA diam:        3.60 cm 2.22 cm/m   RA Area:     10.10 cm LA Vol (A2C):   43.6 ml 26.90 ml/m  RA Volume:   17.10 ml  10.55 ml/m LA Vol (A4C):   43.1 ml 26.59 ml/m LA Biplane Vol: 45.1 ml 27.83 ml/m  AORTIC VALVE AV Area (Vmax):    1.85 cm AV Area (Vmean):   1.86 cm AV Area (VTI):     1.90 cm AV Vmax:           162.00 cm/s AV Vmean:          105.000 cm/s AV VTI:            0.327 m AV Peak Grad:      10.5 mmHg AV Mean Grad:      5.0 mmHg LVOT Vmax:         149.00 cm/s LVOT Vmean:        97.300 cm/s LVOT VTI:          0.309 m LVOT/AV VTI ratio: 0.94  AORTA Ao Root diam: 3.10 cm Ao Asc diam:  3.90 cm MITRAL VALVE               TRICUSPID VALVE MV Area (PHT): 2.26 cm    TR Peak grad:   27.2 mmHg MV Decel Time: 335 msec    TR Vmax:        261.00 cm/s MV E velocity: 65.50 cm/s MV A velocity: 98.50 cm/s  SHUNTS MV E/A ratio:  0.66        Systemic VTI:  0.31 m                            Systemic Diam: 1.60 cm Epifanio Lesches MD Electronically signed by Epifanio Lesches MD Signature Date/Time: 10/26/2022/2:25:58 PM    Final    CT CHEST ABDOMEN PELVIS WO CONTRAST  Result Date: 10/16/2022 CLINICAL DATA:  Ovarian cancer, assess treatment response. * Tracking Code: BO * EXAM: CT CHEST, ABDOMEN AND PELVIS WITHOUT CONTRAST  TECHNIQUE: Multidetector CT imaging of the chest, abdomen and pelvis was performed following the standard protocol without IV contrast. RADIATION DOSE REDUCTION: This exam was performed according to the departmental dose-optimization program which includes automated exposure control, adjustment of the mA and/or kV according to patient size and/or use of iterative reconstruction technique. COMPARISON:  08/01/2022  CT chest, abdomen and pelvis. FINDINGS: CT CHEST FINDINGS Cardiovascular: Normal heart size. No significant pericardial effusion/thickening. Three-vessel coronary atherosclerosis. Right internal jugular Port-A-Cath terminates in middle third of the SVC. Atherosclerotic nonaneurysmal thoracic aorta. Normal caliber pulmonary arteries. Mediastinum/Nodes: No significant thyroid nodules. Unremarkable esophagus. No pathologically enlarged axillary, mediastinal or hilar lymph nodes, noting limited sensitivity for the detection of hilar adenopathy on this noncontrast study. Lungs/Pleura: No pneumothorax. No pleural effusion. No acute consolidative airspace disease or lung masses. Several scattered small solid pulmonary nodules in both lungs measuring up to 0.5 cm in the posterior right lower lobe (series 4/image 105) and 0.4 cm in the left lower lobe (series 4/image 123), all stable since 11/10/2014 chest CT and considered benign. No new significant pulmonary nodules. Musculoskeletal: There are 4 scattered small sclerotic lesions throughout the mid to lower thoracic vertebral bodies, all new from 04/18/2022 CT and increased in size and number from 08/01/2022 CT, largest 0.5 cm in the posterior lower T9 vertebral body (series 6/image 106), increased from 0.2 cm on 08/01/2022. Minimal thoracic spondylosis. CT ABDOMEN PELVIS FINDINGS Hepatobiliary: Normal liver with no liver mass. Normal gallbladder with no radiopaque cholelithiasis. No biliary ductal dilatation. Pancreas: Normal, with no mass or duct dilation. Spleen:  Normal size. No mass. Adrenals/Urinary Tract: Normal adrenals. Nonobstructing 2 mm lower right and 2 mm interpolar left renal stones. No hydronephrosis. No contour deforming renal masses. Normal bladder. Stomach/Bowel: Normal non-distended stomach. Normal caliber small bowel with no small bowel wall thickening. Appendix not discretely visualized. Normal large bowel with no diverticulosis, large bowel wall thickening or pericolonic fat stranding. Vascular/Lymphatic: Atherosclerotic nonaneurysmal abdominal aorta. No pathologically enlarged lymph nodes in the abdomen or pelvis. Reproductive: Status post hysterectomy, with no abnormal findings at the vaginal cuff. Mildly heterogeneous solid 3.9 x 3.5 cm right adnexal mass (series 2/image 97), previously 3.9 x 3.6 cm, stable. No left adnexal mass. Other: No pneumoperitoneum, ascites or focal fluid collection. Widespread omental caking is not substantially changed, for example 1.6 cm thickness in the right lower omentum (series 2/image 84), previously 1.6 cm using similar measurement technique, and 0.8 cm thickness in the anterior left upper abdomen (series 2/image 64), previously 0.8 cm. Irregular mild thickening of the peritoneal reflections in the deep pelvis up to 0.6 cm (series 2/image 101), previously 0.7 cm, minimally decreased. Musculoskeletal: Sclerotic 0.7 cm posterior L2 vertebral lesion (series 2/image 68), slightly increased from 0.6 cm. Sclerotic 1.3 cm posterior left L5 lesion (series 2/image 84), increased from 0.8 cm. Chronic moderate L1 vertebral compression fracture. Bilateral posterior spinal fusion L4-5 with bone cage in the L4-5 disc space. Moderate lumbar spondylosis. IMPRESSION: 1. Widespread omental caking is not substantially changed. Irregular mild thickening of the peritoneal reflections in the deep pelvis, minimally decreased. Stable solid 3.9 cm right adnexal mass. 2. Several small sclerotic osseous metastases in the thoracic vertebral  bodies, all new from 04/18/2022 CT and increased in size and number from 08/01/2022 CT. 3. Two small sclerotic osseous metastases in the lumbar vertebral bodies, increased from 08/01/2022 CT and new from 03/17/2022 CT. 4. Three-vessel coronary atherosclerosis. 5. Nonobstructing bilateral nephrolithiasis. 6.  Aortic Atherosclerosis (ICD10-I70.0). Electronically Signed   By: Delbert Phenix M.D.   On: 10/16/2022 10:23

## 2022-11-01 ENCOUNTER — Other Ambulatory Visit: Payer: Self-pay | Admitting: Hematology and Oncology

## 2022-11-01 DIAGNOSIS — C569 Malignant neoplasm of unspecified ovary: Secondary | ICD-10-CM

## 2022-11-03 ENCOUNTER — Encounter: Payer: Self-pay | Admitting: Hematology and Oncology

## 2022-11-04 ENCOUNTER — Telehealth: Payer: Self-pay

## 2022-11-04 ENCOUNTER — Encounter: Payer: Self-pay | Admitting: Hematology and Oncology

## 2022-11-04 NOTE — Telephone Encounter (Signed)
Sent scheduling message to move appt to 10/3.

## 2022-11-04 NOTE — Telephone Encounter (Signed)
Returned her call. She has a trip planned, leaving on 10/5 and coming back on 10/9. She is scheduled for treatment on 10/4. She is asking if she could get her appts moved to 10/3 or earlier.

## 2022-11-04 NOTE — Telephone Encounter (Signed)
Please send scheduling msg for Alexandria Mclaughlin to reschedule I think the current plan allows 1 day prior

## 2022-11-06 ENCOUNTER — Encounter: Payer: Self-pay | Admitting: Hematology and Oncology

## 2022-11-07 ENCOUNTER — Telehealth: Payer: Self-pay

## 2022-11-07 NOTE — Telephone Encounter (Signed)
Called regarding mychart message. Added port/flush appt on 9/17 for CA-125, she is aware of appt.

## 2022-11-10 ENCOUNTER — Telehealth: Payer: Self-pay

## 2022-11-10 NOTE — Telephone Encounter (Signed)
Returned her call. She is complaining of increased right groin pain and right lower back pain. Denies constipation, last bm last night. She is asking if she can take more percocet.  Instructed to contact pain clinic regarding pain medication. She will try heating pad and call the office back for questions.  FYI

## 2022-11-11 ENCOUNTER — Telehealth: Payer: Self-pay

## 2022-11-11 ENCOUNTER — Encounter: Payer: Self-pay | Admitting: Hematology and Oncology

## 2022-11-11 ENCOUNTER — Other Ambulatory Visit: Payer: Medicare HMO

## 2022-11-11 NOTE — Telephone Encounter (Signed)
Faxed requested letter to Dr. Maisie Fus at the pain clinic. Faxed letter to [phone], received fax confirmation.

## 2022-11-12 ENCOUNTER — Telehealth: Payer: Self-pay | Admitting: Registered Nurse

## 2022-11-12 DIAGNOSIS — C569 Malignant neoplasm of unspecified ovary: Secondary | ICD-10-CM

## 2022-11-12 NOTE — Telephone Encounter (Signed)
Dr. Bertis Ruddy  I received your letter that Ms. Pogue is terminal. I reviewed your 10/31/2022 do you have a life expectancy time frame? Dr Riley Kill was inquiring.   I spoke with my supervising Attending ( Dr Riley Kill ), we don't treat end of life care. We will place a referral to Palliative Care

## 2022-11-12 NOTE — Telephone Encounter (Signed)
Hard to predict life expectancy I do not believe she is asking for end of life care

## 2022-11-12 NOTE — Telephone Encounter (Signed)
Spoke with Dr Riley Kill regarding Ms. Bonini diagnosis of being Terminal.  Referral Place for Palliative Care

## 2022-11-13 ENCOUNTER — Inpatient Hospital Stay: Payer: Medicare HMO

## 2022-11-13 DIAGNOSIS — Z79899 Other long term (current) drug therapy: Secondary | ICD-10-CM | POA: Diagnosis not present

## 2022-11-13 DIAGNOSIS — C569 Malignant neoplasm of unspecified ovary: Secondary | ICD-10-CM

## 2022-11-13 DIAGNOSIS — Z5111 Encounter for antineoplastic chemotherapy: Secondary | ICD-10-CM | POA: Diagnosis not present

## 2022-11-13 DIAGNOSIS — T451X5A Adverse effect of antineoplastic and immunosuppressive drugs, initial encounter: Secondary | ICD-10-CM | POA: Diagnosis not present

## 2022-11-13 DIAGNOSIS — C7951 Secondary malignant neoplasm of bone: Secondary | ICD-10-CM | POA: Diagnosis not present

## 2022-11-13 DIAGNOSIS — D6481 Anemia due to antineoplastic chemotherapy: Secondary | ICD-10-CM | POA: Diagnosis not present

## 2022-11-13 LAB — CBC WITH DIFFERENTIAL (CANCER CENTER ONLY)
Abs Immature Granulocytes: 0.03 10*3/uL (ref 0.00–0.07)
Basophils Absolute: 0 10*3/uL (ref 0.0–0.1)
Basophils Relative: 0 %
Eosinophils Absolute: 0 10*3/uL (ref 0.0–0.5)
Eosinophils Relative: 1 %
HCT: 30 % — ABNORMAL LOW (ref 36.0–46.0)
Hemoglobin: 9.9 g/dL — ABNORMAL LOW (ref 12.0–15.0)
Immature Granulocytes: 1 %
Lymphocytes Relative: 31 %
Lymphs Abs: 1.2 10*3/uL (ref 0.7–4.0)
MCH: 32.4 pg (ref 26.0–34.0)
MCHC: 33 g/dL (ref 30.0–36.0)
MCV: 98 fL (ref 80.0–100.0)
Monocytes Absolute: 0.1 10*3/uL (ref 0.1–1.0)
Monocytes Relative: 3 %
Neutro Abs: 2.5 10*3/uL (ref 1.7–7.7)
Neutrophils Relative %: 64 %
Platelet Count: 105 10*3/uL — ABNORMAL LOW (ref 150–400)
RBC: 3.06 MIL/uL — ABNORMAL LOW (ref 3.87–5.11)
RDW: 13.4 % (ref 11.5–15.5)
WBC Count: 4 10*3/uL (ref 4.0–10.5)
nRBC: 0 % (ref 0.0–0.2)

## 2022-11-13 LAB — CMP (CANCER CENTER ONLY)
ALT: 8 U/L (ref 0–44)
AST: 13 U/L — ABNORMAL LOW (ref 15–41)
Albumin: 3.4 g/dL — ABNORMAL LOW (ref 3.5–5.0)
Alkaline Phosphatase: 93 U/L (ref 38–126)
Anion gap: 3 — ABNORMAL LOW (ref 5–15)
BUN: 17 mg/dL (ref 8–23)
CO2: 33 mmol/L — ABNORMAL HIGH (ref 22–32)
Calcium: 9.3 mg/dL (ref 8.9–10.3)
Chloride: 105 mmol/L (ref 98–111)
Creatinine: 0.52 mg/dL (ref 0.44–1.00)
GFR, Estimated: >60 mL/min (ref >60)
Glucose, Bld: 133 mg/dL — ABNORMAL HIGH (ref 70–99)
Potassium: 3.7 mmol/L (ref 3.5–5.1)
Sodium: 141 mmol/L (ref 135–145)
Total Bilirubin: 0.5 mg/dL (ref 0.3–1.2)
Total Protein: 6 g/dL — ABNORMAL LOW (ref 6.5–8.1)

## 2022-11-13 MED ORDER — HEPARIN SOD (PORK) LOCK FLUSH 100 UNIT/ML IV SOLN
500.0000 [IU] | Freq: Once | INTRAVENOUS | Status: AC
  Administered 2022-11-13: 500 [IU]

## 2022-11-13 MED ORDER — SODIUM CHLORIDE 0.9% FLUSH
10.0000 mL | Freq: Once | INTRAVENOUS | Status: AC
  Administered 2022-11-13: 10 mL

## 2022-11-14 LAB — CA 125: Cancer Antigen (CA) 125: 76.8 U/mL — ABNORMAL HIGH (ref 0.0–38.1)

## 2022-11-19 ENCOUNTER — Encounter: Payer: Medicare HMO | Attending: Physical Medicine and Rehabilitation | Admitting: Physical Medicine & Rehabilitation

## 2022-11-19 ENCOUNTER — Other Ambulatory Visit: Payer: Self-pay | Admitting: Physical Medicine and Rehabilitation

## 2022-11-19 ENCOUNTER — Encounter: Payer: Self-pay | Admitting: Physical Medicine & Rehabilitation

## 2022-11-19 VITALS — BP 130/77 | HR 70 | Ht 63.0 in

## 2022-11-19 DIAGNOSIS — M5416 Radiculopathy, lumbar region: Secondary | ICD-10-CM

## 2022-11-19 DIAGNOSIS — C569 Malignant neoplasm of unspecified ovary: Secondary | ICD-10-CM | POA: Diagnosis not present

## 2022-11-19 DIAGNOSIS — M961 Postlaminectomy syndrome, not elsewhere classified: Secondary | ICD-10-CM

## 2022-11-19 DIAGNOSIS — M533 Sacrococcygeal disorders, not elsewhere classified: Secondary | ICD-10-CM | POA: Diagnosis not present

## 2022-11-19 DIAGNOSIS — G253 Myoclonus: Secondary | ICD-10-CM

## 2022-11-19 MED ORDER — PREGABALIN 50 MG PO CAPS: 50.0000 mg | ORAL_CAPSULE | Freq: Three times a day (TID) | ORAL | 3 refills | Status: DC

## 2022-11-19 NOTE — Patient Instructions (Signed)
LYRICA SCHEDULE:  50MG  AT NIGHT FOR 2 DAYS THEN  50MG  TWICE DAILY FOR 2 DAYS THEN  50MG  THREE X DAILY FOR A WEEK.   IF YOU ARE NOT SEEING ANY RELIEF, CONTACT ME.   KEEP GABAPENTIN THE SAME FOR NOW

## 2022-11-19 NOTE — Progress Notes (Signed)
Subjective:    Patient ID: Alexandria Mclaughlin, female    DOB: 08-22-1940, 82 y.o.   MRN: [id number]  HPI  Alexandria Mclaughlin is here in follow up of her chronic pain. Since I last saw her she was dx'ed with ovarian cancer. She is followed by Dr. Bertis Ruddy who is supervising her CTX. She has another round next week.   Most recent CT demonstrated:   1. Widespread omental caking is not substantially changed. Irregular mild thickening of the peritoneal reflections in the deep pelvis, minimally decreased. Stable solid 3.9 cm right adnexal mass. 2. Several small sclerotic osseous metastases in the thoracic vertebral bodies, all new from 04/18/2022 CT and increased in size and number from 08/01/2022 CT. 3. Two small sclerotic osseous metastases in the lumbar vertebral bodies, increased from 08/01/2022 CT and new from 03/17/2022 CT. 4. Three-vessel coronary atherosclerosis. 5. Nonobstructing bilateral nephrolithiasis. 6.  Aortic Atherosclerosis (ICD10-I70.0).   She is having ongoing pain in her low back but now it's radiating down her left leg to the knee. She is taking gabapentin 600mg  qid along with fentanyl and oxycodone prn. She struggles to do much any more than move around her house. She plays cards twice a week and goes to doctor.   She does have support of palliative care, support group and chaplain.  Pain Inventory Average Pain 6 Pain Right Now 6 My pain is constant, sharp, burning, stabbing, and tingling  In the last 24 hours, has pain interfered with the following? General activity 7 Relation with others 6 Enjoyment of life 7 What TIME of day is your pain at its worst? daytime and night Sleep (in general) Poor  Pain is worse with: walking, bending, and standing Pain improves with: rest, heat/ice, and medication Relief from Meds: 6  Family History  Problem Relation Age of Onset   Diabetes Mother    Heart disease Mother    Stroke Mother    Heart disease Father    Heart disease Brother     Colon cancer Neg Hx    Breast cancer Neg Hx    Ovarian cancer Neg Hx    Endometrial cancer Neg Hx    Pancreatic cancer Neg Hx    Prostate cancer Neg Hx    Social History   Socioeconomic History   Marital status: Widowed    Spouse name: Not on file   Number of children: Not on file   Years of education: 14   Highest education level: Associate degree: academic program  Occupational History   Occupation: Retired  Tobacco Use   Smoking status: Former    Current packs/day: 0.00    Average packs/day: 0.3 packs/day for 15.0 years (3.8 ttl pk-yrs)    Types: Cigarettes    Start date: 03/15/1953    Quit date: 03/15/1968    Years since quitting: 54.7   Smokeless tobacco: Never  Vaping Use   Vaping status: Never Used  Substance and Sexual Activity   Alcohol use: Not Currently    Comment: occasional glass of wine   Drug use: Yes    Frequency: 20.0 times per week    Types: Fentanyl, Hydrocodone   Sexual activity: Not Currently  Other Topics Concern   Not on file  Social History Narrative   Right handed   Drinks caffeine   Condo two story with Engineer, structural   Social Determinants of Health   Financial Resource Strain: Not on file  Food Insecurity: Not on file  Transportation Needs: Not on file  Physical Activity: Not on file  Stress: Not on file  Social Connections: Unknown (07/04/2021)   Received from [facility]   Social Network    Social Network: Not on file   Past Surgical History:  Procedure Laterality Date   ABDOMINAL HYSTERECTOMY     Due to fibroids   BACK SURGERY  2011   lumbar Fusion   BLADDER SURGERY  1985   CESAREAN SECTION     x2   EYE SURGERY Bilateral    Cataract surgery with IOL   FOOT SURGERY Right    "just scraped the bone"   IR IMAGING GUIDED PORT INSERTION  05/26/2022   LAPAROSCOPY N/A 04/08/2022   Procedure: LAPAROSCOPY DIAGNOSTIC WITH BIOPSIES;  Surgeon: Clide Cliff, MD;  Location: [facility];  Service: Gynecology;  Laterality: N/A;    RETINAL DETACHMENT SURGERY Right 10/2011   TRANSFORAMINAL LUMBAR INTERBODY FUSION W/ MIS 1 LEVEL Right 04/26/2018   Procedure: Right Lumbar four-five Minimally invasive Transforaminal lumbar interbody fusion;  Surgeon: Jadene Pierini, MD;  Location: [facility];  Service: Neurosurgery;  Laterality: Right;   Past Surgical History:  Procedure Laterality Date   ABDOMINAL HYSTERECTOMY     Due to fibroids   BACK SURGERY  2011   lumbar Fusion   BLADDER SURGERY  1985   CESAREAN SECTION     x2   EYE SURGERY Bilateral    Cataract surgery with IOL   FOOT SURGERY Right    "just scraped the bone"   IR IMAGING GUIDED PORT INSERTION  05/26/2022   LAPAROSCOPY N/A 04/08/2022   Procedure: LAPAROSCOPY DIAGNOSTIC WITH BIOPSIES;  Surgeon: Clide Cliff, MD;  Location: [facility];  Service: Gynecology;  Laterality: N/A;   RETINAL DETACHMENT SURGERY Right 10/2011   TRANSFORAMINAL LUMBAR INTERBODY FUSION W/ MIS 1 LEVEL Right 04/26/2018   Procedure: Right Lumbar four-five Minimally invasive Transforaminal lumbar interbody fusion;  Surgeon: Jadene Pierini, MD;  Location: [facility];  Service: Neurosurgery;  Laterality: Right;   Past Medical History:  Diagnosis Date   Abnormality of gait 02/07/2015   Acquired hallux rigidus of left foot 12/17/2020   Acquired unequal leg length on left 08/08/2011   AMS (altered mental status) 12/28/2020   Biceps tendonitis on left 08/18/2018   BPPV (benign paroxysmal positional vertigo) 02/07/2015   Bunion 12/17/2020   Cancer (HCC) 04/25/2022   Cerebrovascular disease    Cervical facet syndrome    Coronary artery disease 04/27/2022   Disorders of sacrum    Enthesopathy of hip region    Essential hypertension 02/07/2015   Generalized anxiety disorder    GERD (gastroesophageal reflux disease)    Headache    History of kidney stones    HLD (hyperlipidemia) 02/07/2015   Low back pain 02/07/2015   Lumbar facet arthropathy 07/05/2014   Lumbar post-laminectomy syndrome  06/11/2011   Lumbar radicular pain 07/29/2016   Lumbar radiculopathy 04/26/2018   Major depressive disorder    Mild cognitive impairment of uncertain or unknown etiology 01/24/2022   Multiple lacunar infarcts    MRI - bilateral basal ganglia and left thalamus   Myoclonus 09/19/2015   Rotator cuff tendonitis, left 06/08/2018   Sacroiliac joint dysfunction 06/11/2011   Sciatic neuritis    Stroke (HCC)    hx TIA   Subcortical infarction    2016 MRI - small chronic subcortical infarct with associated chronic hemosiderin deposition within the right superior frontal gyrus.   Synovitis and tenosynovitis    Therapeutic opioid induced constipation 07/25/2015  Thyroid disease    Transient left leg weakness    Vision abnormalities    BP 130/77   Pulse 70   Ht 5\' 3"  (1.6 m)   SpO2 95%   BMI 23.63 kg/m   Opioid Risk Score:   Fall Risk Score:  `1  Depression screen PHQ 2/9     11/19/2022    2:46 PM 10/15/2022    1:24 PM 08/21/2022    3:01 PM 04/24/2022    1:11 PM 02/25/2022    1:02 PM 08/28/2021    2:31 PM 06/19/2021    2:53 PM  Depression screen PHQ 2/9  Decreased Interest 1 1 1 1  0 0 1  Down, Depressed, Hopeless 1 1 1 1  0 0 1  PHQ - 2 Score 2 2 2 2  0 0 2    Review of Systems  Musculoskeletal:  Positive for gait problem.       Right hip & buttock pain, left buttock pain down to left knee  All other systems reviewed and are negative.      Objective:   Physical Exam  General: No acute distress, pale HEENT: NCAT, EOMI, oral membranes moist Cards: reg rate  Chest: normal effort Abdomen: Soft, NT, ND Skin: dry, intact Extremities: no edema Psych: pleasant and appropriate   Neurological: Patient is alert and oriented x3 with normal insight and awareness.  Speech is clear.  No language issues.  no nystagmus.  she demonstrates significant truncal ataxia in stance. Fell to left/forward.  No limb ataxia. Speech is clear. Normal language. Strength 5/5. Sensation nl except for RLE.    Musc: pain with LLE SLR.       1.  Chronic low back pain with lumbar postlaminectomy syndrome.  Recent fall with new L1 compression fracture. - -continue fentanyl patch  every 72 hours, #10.  no RF needed today  -continue Percocet 10/325 #75.   We will continue the controlled substance monitoring program, this consists of regular clinic visits, examinations, routine drug screening, pill counts as well as use of [place] Controlled Substance Reporting System. [place].    2. Neuropathic Pain/right sided lumbar radicular pain, worsened with ovarian ca/mets  -continue gabapentin  -add lyrica for uncontrolled neuropathic pain, will transition over to lyrica alone if it proves helpful 3.  Ovarian cancer  -continue ctx per oncology  -I'm happy that she has support of palliatve care, support group and her chaplain   Fifteen minutes of face to face patient care time were spent during this visit. All questions were encouraged and answered.  Follow up with me in 4-6 weeks.

## 2022-11-20 ENCOUNTER — Telehealth: Payer: Self-pay

## 2022-11-20 ENCOUNTER — Other Ambulatory Visit: Payer: Self-pay

## 2022-11-20 NOTE — Telephone Encounter (Signed)
PREGABALIN Capsule Type of coverage approved: Prior Authorization This approval authorizes your coverage from 02/24/2022 - 02/24/2023, unless we notify you  otherwise, and as long as the following conditions apply:

## 2022-11-20 NOTE — Telephone Encounter (Signed)
PA fo Pregabalin sent submitted in Cover My Med

## 2022-11-21 ENCOUNTER — Other Ambulatory Visit: Payer: Self-pay | Admitting: Physical Medicine and Rehabilitation

## 2022-11-21 ENCOUNTER — Telehealth: Payer: Self-pay | Admitting: Physical Medicine and Rehabilitation

## 2022-11-21 MED ORDER — CLONAZEPAM 0.5 MG PO TABS: 0.5000 mg | ORAL_TABLET | Freq: Every evening | ORAL | 0 refills | Status: DC | PRN

## 2022-11-21 NOTE — Telephone Encounter (Signed)
Patient called requesting clonacepam refill

## 2022-11-25 ENCOUNTER — Encounter: Payer: Self-pay | Admitting: Hematology and Oncology

## 2022-11-26 MED FILL — Dexamethasone Sodium Phosphate Inj 100 MG/10ML: INTRAMUSCULAR | Qty: 1 | Status: AC

## 2022-11-27 ENCOUNTER — Inpatient Hospital Stay: Payer: Medicare HMO | Attending: Psychiatry

## 2022-11-27 ENCOUNTER — Inpatient Hospital Stay: Payer: Medicare HMO

## 2022-11-27 ENCOUNTER — Inpatient Hospital Stay: Payer: Medicare HMO | Admitting: Hematology and Oncology

## 2022-11-27 ENCOUNTER — Encounter: Payer: Self-pay | Admitting: Hematology and Oncology

## 2022-11-27 VITALS — BP 140/80 | HR 85 | Resp 18

## 2022-11-27 DIAGNOSIS — C561 Malignant neoplasm of right ovary: Secondary | ICD-10-CM | POA: Insufficient documentation

## 2022-11-27 DIAGNOSIS — C7951 Secondary malignant neoplasm of bone: Secondary | ICD-10-CM | POA: Diagnosis not present

## 2022-11-27 DIAGNOSIS — C569 Malignant neoplasm of unspecified ovary: Secondary | ICD-10-CM | POA: Diagnosis not present

## 2022-11-27 DIAGNOSIS — D61818 Other pancytopenia: Secondary | ICD-10-CM | POA: Insufficient documentation

## 2022-11-27 DIAGNOSIS — Z79899 Other long term (current) drug therapy: Secondary | ICD-10-CM | POA: Insufficient documentation

## 2022-11-27 DIAGNOSIS — K1379 Other lesions of oral mucosa: Secondary | ICD-10-CM | POA: Diagnosis not present

## 2022-11-27 DIAGNOSIS — Z5111 Encounter for antineoplastic chemotherapy: Secondary | ICD-10-CM | POA: Diagnosis not present

## 2022-11-27 DIAGNOSIS — K5909 Other constipation: Secondary | ICD-10-CM | POA: Insufficient documentation

## 2022-11-27 DIAGNOSIS — C786 Secondary malignant neoplasm of retroperitoneum and peritoneum: Secondary | ICD-10-CM | POA: Diagnosis not present

## 2022-11-27 LAB — CMP (CANCER CENTER ONLY)
ALT: 6 U/L (ref 0–44)
AST: 12 U/L — ABNORMAL LOW (ref 15–41)
Albumin: 3.9 g/dL (ref 3.5–5.0)
Alkaline Phosphatase: 106 U/L (ref 38–126)
Anion gap: 7 (ref 5–15)
BUN: 18 mg/dL (ref 8–23)
CO2: 28 mmol/L (ref 22–32)
Calcium: 10 mg/dL (ref 8.9–10.3)
Chloride: 105 mmol/L (ref 98–111)
Creatinine: 0.53 mg/dL (ref 0.44–1.00)
GFR, Estimated: >60 mL/min (ref >60)
Glucose, Bld: 140 mg/dL — ABNORMAL HIGH (ref 70–99)
Potassium: 3.8 mmol/L (ref 3.5–5.1)
Sodium: 140 mmol/L (ref 135–145)
Total Bilirubin: 0.5 mg/dL (ref 0.3–1.2)
Total Protein: 7 g/dL (ref 6.5–8.1)

## 2022-11-27 LAB — CBC WITH DIFFERENTIAL (CANCER CENTER ONLY)
Abs Immature Granulocytes: 0 10*3/uL (ref 0.00–0.07)
Basophils Absolute: 0 10*3/uL (ref 0.0–0.1)
Basophils Relative: 0 %
Eosinophils Absolute: 0 10*3/uL (ref 0.0–0.5)
Eosinophils Relative: 0 %
HCT: 35.5 % — ABNORMAL LOW (ref 36.0–46.0)
Hemoglobin: 11.3 g/dL — ABNORMAL LOW (ref 12.0–15.0)
Immature Granulocytes: 0 %
Lymphocytes Relative: 26 %
Lymphs Abs: 0.8 10*3/uL (ref 0.7–4.0)
MCH: 31.4 pg (ref 26.0–34.0)
MCHC: 31.8 g/dL (ref 30.0–36.0)
MCV: 98.6 fL (ref 80.0–100.0)
Monocytes Absolute: 0.2 10*3/uL (ref 0.1–1.0)
Monocytes Relative: 6 %
Neutro Abs: 2 10*3/uL (ref 1.7–7.7)
Neutrophils Relative %: 68 %
Platelet Count: 235 10*3/uL (ref 150–400)
RBC: 3.6 MIL/uL — ABNORMAL LOW (ref 3.87–5.11)
RDW: 14.3 % (ref 11.5–15.5)
WBC Count: 3 10*3/uL — ABNORMAL LOW (ref 4.0–10.5)
nRBC: 0 % (ref 0.0–0.2)

## 2022-11-27 MED ORDER — SODIUM CHLORIDE 0.9 % IV SOLN
10.0000 mg | Freq: Once | INTRAVENOUS | Status: AC
  Administered 2022-11-27: 10 mg via INTRAVENOUS
  Filled 2022-11-27: qty 10

## 2022-11-27 MED ORDER — HEPARIN SOD (PORK) LOCK FLUSH 100 UNIT/ML IV SOLN
500.0000 [IU] | Freq: Once | INTRAVENOUS | Status: AC | PRN
  Administered 2022-11-27: 500 [IU]

## 2022-11-27 MED ORDER — SODIUM CHLORIDE 0.9% FLUSH
10.0000 mL | INTRAVENOUS | Status: DC | PRN
  Administered 2022-11-27: 10 mL

## 2022-11-27 MED ORDER — DOXORUBICIN HCL LIPOSOMAL CHEMO INJECTION 2 MG/ML
40.0000 mg/m2 | Freq: Once | INTRAVENOUS | Status: AC
  Administered 2022-11-27: 60 mg via INTRAVENOUS
  Filled 2022-11-27: qty 30

## 2022-11-27 MED ORDER — DEXTROSE 5 % IV SOLN: Freq: Once | INTRAVENOUS | Status: AC

## 2022-11-27 MED ORDER — SODIUM CHLORIDE 0.9% FLUSH
10.0000 mL | Freq: Once | INTRAVENOUS | Status: AC
  Administered 2022-11-27: 10 mL

## 2022-11-27 NOTE — Progress Notes (Signed)
[facility] OFFICE PROGRESS NOTE  Patient Care Team: Georgianne Fick, MD as PCP - General (Internal Medicine) Parke Poisson, MD as PCP - Cardiology (Cardiology) Drema Dallas, DO as Consulting Physician (Neurology)  HISTORY OF PRESENTING ILLNESS: Discussed the use of AI scribe software for clinical note transcription with the patient, who gave verbal consent to proceed.  History of Present Illness   The patient, with a history of ovarian cancer, wants to consider a second opinion on her condition and treatment plan. She expressed willingness to travel for this second opinion, with financial support from a friend. The patient also requested a letter from the physician stating her terminal status, with the intention of obtaining more pain medication. She reported that her PCP recently refilled her current pain management regimen and added Lyrica and that was effective.  The patient has been undergoing chemotherapy and reported tolerating it well, with only minor mouth sores as a side effect. She also reported chronic constipation, which she attributes to her pain medication. The patient expressed fear of experiencing severe pain, influenced by the experience of a friend who died from cancer.  The patient's blood work showed a slightly low white count, but stable hemoglobin and normal platelet count.         Assessment and Plan    Ovarian cancer Undergoing chemotherapy with tolerable side effects. Patient expressed interest in a second opinion, which was discussed and clarified. -Continue current chemotherapy regimen. -Refer for second opinion upon patient's request once she is sure she wants to proceed  Pain Management Patient's pain is managed by another physician with recent addition of Lyrica, which is effective. We have a discussion about role of palliative care and the current stage of her disease. -Continue current pain management with Lyrica as prescribed by  her pain management physician. -Refer to palliative care team for comprehensive symptom management and support.  Constipation Chronic issue, likely exacerbated by pain medication. No worsening reported. -Continue current management.  Oral Ulcers Mild mouth sores reported, a known side effect of chemotherapy. -Advise saltwater gargles and to contact the office if sores worsen or interfere with eating or swallowing.  Mild pancytopenia Not symptomatic, proceed without delay  -Repeat CT scan after the third dose of chemotherapy to assess response. -Follow-up in a couple of weeks.          Orders Placed This Encounter  Procedures   Amb Referral to Palliative Care    Referral Priority:   Routine    Referral Type:   Consultation    Referral Reason:   Symptom Managment    Number of Visits Requested:   1    All questions were answered. The patient knows to call the clinic with any problems, questions or concerns. The total time spent in the appointment was 40 minutes encounter with patients including review of chart and various tests results, discussions about plan of care and coordination of care plan   Artis Delay, MD 11/27/2022 1:03 PM  REVIEW OF SYSTEMS:   Constitutional: Denies fevers, chills or abnormal weight loss Eyes: Denies blurriness of vision Respiratory: Denies cough, dyspnea or wheezes Cardiovascular: Denies palpitation, chest discomfort or lower extremity swelling Skin: Denies abnormal skin rashes Lymphatics: Denies new lymphadenopathy or easy bruising Neurological:Denies numbness, tingling or new weaknesses Behavioral/Psych: Mood is stable, no new changes  All other systems were reviewed with the patient and are negative.  I have reviewed the past medical history, past surgical history, social history and family  history with the patient and they are unchanged from previous note.  ALLERGIES:  has No Known Allergies.  MEDICATIONS:  Current Outpatient Medications   Medication Sig Dispense Refill   aspirin-acetaminophen-caffeine (EXCEDRIN MIGRAINE) 250-250-65 MG tablet Take 1 tablet by mouth every 6 (six) hours as needed for headache or migraine.     Cholecalciferol (VITAMIN D) 50 MCG (2000 UT) CAPS Take 2,000 Units by mouth daily.     clonazePAM (KLONOPIN) 0.5 MG tablet Take 1 tablet (0.5 mg total) by mouth at bedtime as needed for anxiety. 60 tablet 0   cyanocobalamin 1000 MCG tablet 1,000 mcg daily.     dexamethasone (DECADRON) 4 MG tablet Take 2 tabs at the night before and 2 tab the morning of chemotherapy, every 3 weeks, by mouth x 6 cycles 24 tablet 6   DULoxetine (CYMBALTA) 60 MG capsule TAKE 1 CAPSULE BY MOUTH EVERY DAY 90 capsule 3   fentaNYL (DURAGESIC) 25 MCG/HR Place 1 patch onto the skin every 3 (three) days. 10 patch 0   gabapentin (NEURONTIN) 600 MG tablet TAKE 1 TABLET BY MOUTH FOUR TIMES A DAY 360 tablet 1   irbesartan (AVAPRO) 150 MG tablet Take 150 mg by mouth daily.     lactulose (CHRONULAC) 10 GM/15ML solution TAKE 15 MLS (10 G TOTAL) BY MOUTH 3 (THREE) TIMES DAILY. 473 mL 1   lidocaine-prilocaine (EMLA) cream Apply topically daily.     magnesium oxide (MAG-OX) 400 (240 Mg) MG tablet TAKE 1 TABLET BY MOUTH EVERY DAY 30 tablet 1   Melatonin 10 MG TABS Take 10 mg by mouth at bedtime as needed (sleep).     methimazole (TAPAZOLE) 5 MG tablet Take 5 mg by mouth. 3 times a week only     ondansetron (ZOFRAN) 8 MG tablet Take 1 tablet (8 mg total) by mouth every 8 (eight) hours as needed for nausea or vomiting. Start on the third day after carboplatin. 30 tablet 1   oxyCODONE-acetaminophen (PERCOCET) 10-325 MG tablet Take 1 tablet by mouth 5 (five) times daily as needed for pain. 130 tablet 0   polyethylene glycol (MIRALAX / GLYCOLAX) 17 g packet Take 17 g by mouth 2 (two) times daily.     pregabalin (LYRICA) 50 MG capsule Take 1 capsule (50 mg total) by mouth 3 (three) times daily. 90 capsule 3   prochlorperazine (COMPAZINE) 10 MG tablet TAKE  1 TABLET BY MOUTH EVERY 6 HOURS AS NEEDED FOR NAUSEA OR VOMITING. 30 tablet 1   rosuvastatin (CRESTOR) 20 MG tablet Take 20 mg by mouth daily.     rosuvastatin (CRESTOR) 40 MG tablet Take 1 tablet (40 mg total) by mouth at bedtime. (Patient not taking: Reported on 11/19/2022) 90 tablet 3   senna (SENOKOT) 8.6 MG tablet Take 2 tablets by mouth 2 (two) times daily.     traZODone (DESYREL) 100 MG tablet TAKE 1 TABLET BY MOUTH EVERYDAY AT BEDTIME 90 tablet 2   No current facility-administered medications for this visit.   Facility-Administered Medications Ordered in Other Visits  Medication Dose Route Frequency Provider Last Rate Last Admin   dexamethasone (DECADRON) 10 mg in sodium chloride 0.9 % 50 mL IVPB  10 mg Intravenous Once Bertis Ruddy, Aeson Sawyers, MD 204 mL/hr at 11/27/22 1257 10 mg at 11/27/22 1257   DOXOrubicin HCL LIPOSOMAL (DOXIL) 60 mg in dextrose 5 % 250 mL chemo infusion  40 mg/m2 (Treatment Plan Recorded) Intravenous Once Bertis Ruddy, Jolicia Delira, MD       heparin lock flush 100 unit/mL  500 Units Intracatheter Once PRN Bertis Ruddy, Jordan Caraveo, MD       sodium chloride flush (NS) 0.9 % injection 10 mL  10 mL Intracatheter PRN Bertis Ruddy, Taunya Goral, MD        SUMMARY OF ONCOLOGIC HISTORY: Oncology History Overview Note  High grade serous, neg genetics P53 mutated, MSI stable, low TMB, HRD not detected Primary refractory disease to carboplatin/paclitaxel   Ovarian cancer (HCC)  03/17/2022 Imaging   CT Abdomen/Pelvis: IMPRESSION: 1. Heterogeneous right adnexal mass measuring 3.8 x 3.0 cm with associated soft tissue thickening along the peritoneal reflections and greater omentum. Findings are highly suspicious for ovarian neoplasm with peritoneal carcinomatosis. 2. No evidence of bowel obstruction. 3. Coronary artery calcifications.   03/18/2022 Tumor Marker   CA125: 163   04/08/2022 Surgery   Diagnostic laparoscopy with peritoneal and omental biopsies  Findings: On entry to abdomen, omentum and large bowel broadly and  densely adherent to the anterior abdominal wall in the midline from the level of the falciform ligament superiorly to the bladder inferiorly. Evidence of peritoneal plaques consistent with peritoneal carcinomatosis on the left diaphragm and left anterior abdominal wall and pelvic peritoneum. Right diaphragm and liver limited in visualization but no evidence of disease. Left liver lobe and stomach normal. Right abdomen not visualized due to the midline adhesions. Pelvic mass not visualized due to adhesions. Omentum adherent to the anterior abdominal wall consistent with omental cake. Small bowel and mesentery that is able to be visualized does not appear to have disease. Initial peritoneal biopsy indeterminate for carcinoma on frozen. Additional biopsies obtained.    04/08/2022 Initial Diagnosis   Ovarian cancer (HCC)   04/08/2022 Pathology Results   A. PERITONEUM, BIOPSY:  Microscopic focus of adenocarcinoma.  See comment.   B. PERITONEUM, #2, BIOPSY:  Microscopic focus of adenocarcinoma.   C. OMENTUM, BIOPSY:  Adenocarcinoma.  See comment.   COMMENT:  A. The permanent recuts of the peritoneal biopsy submitted for frozen  section show a microscopic focus of adenocarcinoma which is not  identified in the frozen section slides.   C.  The omental specimen shows adenocarcinoma and immunohistochemistry  will be performed and reported as an addendum.  The morphologic features  favor an ovarian primary.   ADDENDUM:  Immunohistochemistry shows the carcinoma is positive with PAX8 and p16 with patchy positive staining with WT1 and estrogen receptor.  Progesterone receptor is negative.  P53 shows diffuse strong positivity (mutated pattern).  The morphology and immunophenotype are consistent with high-grade serous carcinoma.    04/08/2022 Pathology Results   CYTOLOGY FINAL MICROSCOPIC DIAGNOSIS:  - Malignant cells consistent with adenocarcinoma  - See comment    04/30/2022 - 09/26/2022 Chemotherapy    Patient is on Treatment Plan : OVARIAN Carboplatin (AUC 6) + Paclitaxel (175) q21d X 6 Cycles     05/02/2022 Tumor Marker   Patient's tumor was tested for the following markers: CA-125. Results of the tumor marker test revealed 193.   05/26/2022 Procedure   Technically successful right IJ power-injectable port catheter placement. Ready for routine use.     05/30/2022 Tumor Marker   Patient's tumor was tested for the following markers: CA-125. Results of the tumor marker test revealed 93.5.   07/04/2022 Genetic Testing   Negative [facility] Multi-Cancer +RNA Panel.  VUS in ALK at c.2239G>A (p.Gly747Arg).  Report date is 07/04/2022.   The Multi-Cancer + RNA Panel offered by [facility] includes sequencing and/or deletion/duplication analysis of the following 70 genes:  AIP*, ALK, APC*, ATM*, AXIN2*, BAP1*,  BARD1*, BLM*, BMPR1A*, BRCA1*, BRCA2*, BRIP1*, CDC73*, CDH1*, CDK4, CDKN1B*, CDKN2A, CHEK2*, CTNNA1*, DICER1*, EPCAM (del/dup only), EGFR, FH*, FLCN*, GREM1 (promoter dup only), HOXB13, KIT, LZTR1, MAX*, MBD4, MEN1*, MET, MITF, MLH1*, MSH2*, MSH3*, MSH6*, MUTYH*, NF1*, NF2*, NTHL1*, PALB2*, PDGFRA, PMS2*, POLD1*, POLE*, POT1*, PRKAR1A*, PTCH1*, PTEN*, RAD51C*, RAD51D*, RB1*, RET, SDHA* (sequencing only), SDHAF2*, SDHB*, SDHC*, SDHD*, SMAD4*, SMARCA4*, SMARCB1*, SMARCE1*, STK11*, SUFU*, TMEM127*, TP53*, TSC1*, TSC2*, VHL*. RNA analysis is performed for * genes.    08/04/2022 Imaging   1. Today's study demonstrates stable burden of disease. Specifically, previously noted right ovarian mass appears grossly unchanged, as does the extent of intraperitoneal metastatic disease, as detailed above. 2. Multiple small solid-appearing pulmonary nodules measuring 5 mm or less noted on the prior examination are unchanged. 3. Probable right upper lobe bronchopneumonia, as above. 4. Aortic atherosclerosis, in addition to left main and three-vessel coronary artery disease. 5. Additional incidental findings, as above.    09/01/2022 Tumor Marker   Patient's tumor was tested for the following markers: CA-125. Results of the tumor marker test revealed 43.4.   09/29/2022 Tumor Marker   Patient's tumor was tested for the following markers: CA-125. Results of the tumor marker test revealed 46.3.   10/16/2022 Cancer Staging   Staging form: Ovary, Fallopian Tube, and Primary Peritoneal Carcinoma, AJCC 8th Edition - Clinical stage from 10/16/2022: FIGO Stage IVB, calculated as Stage IV (ycT3c, cN0, cM1) - Signed by Artis Delay, MD on 10/16/2022 Stage prefix: Post-therapy   10/16/2022 Imaging   CT CHEST ABDOMEN PELVIS WO CONTRAST  Result Date: 10/16/2022 CLINICAL DATA:  Ovarian cancer, assess treatment response. * Tracking Code: BO * EXAM: CT CHEST, ABDOMEN AND PELVIS WITHOUT CONTRAST TECHNIQUE: Multidetector CT imaging of the chest, abdomen and pelvis was performed following the standard protocol without IV contrast. RADIATION DOSE REDUCTION: This exam was performed according to the departmental dose-optimization program which includes automated exposure control, adjustment of the mA and/or kV according to patient size and/or use of iterative reconstruction technique. COMPARISON:  08/01/2022 CT chest, abdomen and pelvis. FINDINGS: CT CHEST FINDINGS Cardiovascular: Normal heart size. No significant pericardial effusion/thickening. Three-vessel coronary atherosclerosis. Right internal jugular Port-A-Cath terminates in middle third of the SVC. Atherosclerotic nonaneurysmal thoracic aorta. Normal caliber pulmonary arteries. Mediastinum/Nodes: No significant thyroid nodules. Unremarkable esophagus. No pathologically enlarged axillary, mediastinal or hilar lymph nodes, noting limited sensitivity for the detection of hilar adenopathy on this noncontrast study. Lungs/Pleura: No pneumothorax. No pleural effusion. No acute consolidative airspace disease or lung masses. Several scattered small solid pulmonary nodules in both lungs measuring  up to 0.5 cm in the posterior right lower lobe (series 4/image 105) and 0.4 cm in the left lower lobe (series 4/image 123), all stable since 11/10/2014 chest CT and considered benign. No new significant pulmonary nodules. Musculoskeletal: There are 4 scattered small sclerotic lesions throughout the mid to lower thoracic vertebral bodies, all new from 04/18/2022 CT and increased in size and number from 08/01/2022 CT, largest 0.5 cm in the posterior lower T9 vertebral body (series 6/image 106), increased from 0.2 cm on 08/01/2022. Minimal thoracic spondylosis. CT ABDOMEN PELVIS FINDINGS Hepatobiliary: Normal liver with no liver mass. Normal gallbladder with no radiopaque cholelithiasis. No biliary ductal dilatation. Pancreas: Normal, with no mass or duct dilation. Spleen: Normal size. No mass. Adrenals/Urinary Tract: Normal adrenals. Nonobstructing 2 mm lower right and 2 mm interpolar left renal stones. No hydronephrosis. No contour deforming renal masses. Normal bladder. Stomach/Bowel: Normal non-distended stomach. Normal caliber small bowel with no small bowel wall  thickening. Appendix not discretely visualized. Normal large bowel with no diverticulosis, large bowel wall thickening or pericolonic fat stranding. Vascular/Lymphatic: Atherosclerotic nonaneurysmal abdominal aorta. No pathologically enlarged lymph nodes in the abdomen or pelvis. Reproductive: Status post hysterectomy, with no abnormal findings at the vaginal cuff. Mildly heterogeneous solid 3.9 x 3.5 cm right adnexal mass (series 2/image 97), previously 3.9 x 3.6 cm, stable. No left adnexal mass. Other: No pneumoperitoneum, ascites or focal fluid collection. Widespread omental caking is not substantially changed, for example 1.6 cm thickness in the right lower omentum (series 2/image 84), previously 1.6 cm using similar measurement technique, and 0.8 cm thickness in the anterior left upper abdomen (series 2/image 64), previously 0.8 cm. Irregular mild  thickening of the peritoneal reflections in the deep pelvis up to 0.6 cm (series 2/image 101), previously 0.7 cm, minimally decreased. Musculoskeletal: Sclerotic 0.7 cm posterior L2 vertebral lesion (series 2/image 68), slightly increased from 0.6 cm. Sclerotic 1.3 cm posterior left L5 lesion (series 2/image 84), increased from 0.8 cm. Chronic moderate L1 vertebral compression fracture. Bilateral posterior spinal fusion L4-5 with bone cage in the L4-5 disc space. Moderate lumbar spondylosis. IMPRESSION: 1. Widespread omental caking is not substantially changed. Irregular mild thickening of the peritoneal reflections in the deep pelvis, minimally decreased. Stable solid 3.9 cm right adnexal mass. 2. Several small sclerotic osseous metastases in the thoracic vertebral bodies, all new from 04/18/2022 CT and increased in size and number from 08/01/2022 CT. 3. Two small sclerotic osseous metastases in the lumbar vertebral bodies, increased from 08/01/2022 CT and new from 03/17/2022 CT. 4. Three-vessel coronary atherosclerosis. 5. Nonobstructing bilateral nephrolithiasis. 6.  Aortic Atherosclerosis (ICD10-I70.0). Electronically Signed   By: Delbert Phenix M.D.   On: 10/16/2022 10:23      10/28/2022 Echocardiogram       1. Left ventricular ejection fraction, by estimation, is 60 to 65%. The left ventricle has normal function. The left ventricle has no regional wall motion abnormalities. There is mild left ventricular hypertrophy. Left ventricular diastolic parameters  are consistent with Grade I diastolic dysfunction (impaired relaxation).  2. Right ventricular systolic function is normal. The right ventricular size is normal.  3. The mitral valve is normal in structure. Trivial mitral valve regurgitation. No evidence of mitral stenosis.  4. The aortic valve is tricuspid. Aortic valve regurgitation is not visualized. Aortic valve sclerosis/calcification is present, without any evidence of aortic stenosis.  5. Aortic  dilatation noted. There is mild dilatation of the ascending aorta, measuring 39 mm.     10/31/2022 -  Chemotherapy   Patient is on Treatment Plan : OVARIAN Liposomal Doxorubicin (40) q28d     11/14/2022 Tumor Marker   Patient's tumor was tested for the following markers: CA-125. Results of the tumor marker test revealed 76.8.     PHYSICAL EXAMINATION: ECOG PERFORMANCE STATUS: 2 - Symptomatic, <50% confined to bed  Vitals:   11/27/22 1136  BP: 126/77  Pulse: (!) 102  Resp: 18  Temp: 98.5 F (36.9 C)  SpO2: 97%   Filed Weights   11/27/22 1136  Weight: 131 lb (59.4 kg)    GENERAL:alert, no distress and comfortable  LABORATORY DATA:  I have reviewed the data as listed    Component Value Date/Time   NA 140 11/27/2022 1118   NA 139 09/19/2015 1422   K 3.8 11/27/2022 1118   CL 105 11/27/2022 1118   CO2 28 11/27/2022 1118   GLUCOSE 140 (H) 11/27/2022 1118   BUN 18 11/27/2022 1118  BUN 20 09/19/2015 1422   CREATININE 0.53 11/27/2022 1118   CALCIUM 10.0 11/27/2022 1118   PROT 7.0 11/27/2022 1118   PROT 7.0 09/19/2015 1422   ALBUMIN 3.9 11/27/2022 1118   ALBUMIN 4.6 09/19/2015 1422   AST 12 (L) 11/27/2022 1118   ALT 6 11/27/2022 1118   ALKPHOS 106 11/27/2022 1118   BILITOT 0.5 11/27/2022 1118   GFRNONAA >60 11/27/2022 1118   GFRAA >60 04/20/2018 1452    No results found for: "SPEP", "UPEP"  Lab Results  Component Value Date   WBC 3.0 (L) 11/27/2022   NEUTROABS 2.0 11/27/2022   HGB 11.3 (L) 11/27/2022   HCT 35.5 (L) 11/27/2022   MCV 98.6 11/27/2022   PLT 235 11/27/2022      Chemistry      Component Value Date/Time   NA 140 11/27/2022 1118   NA 139 09/19/2015 1422   K 3.8 11/27/2022 1118   CL 105 11/27/2022 1118   CO2 28 11/27/2022 1118   BUN 18 11/27/2022 1118   BUN 20 09/19/2015 1422   CREATININE 0.53 11/27/2022 1118      Component Value Date/Time   CALCIUM 10.0 11/27/2022 1118   ALKPHOS 106 11/27/2022 1118   AST 12 (L) 11/27/2022 1118   ALT 6  11/27/2022 1118   BILITOT 0.5 11/27/2022 1118

## 2022-11-27 NOTE — Patient Instructions (Signed)
[facility]  Discharge Instructions: Thank you for choosing [facility] to provide your oncology and hematology care.   If you have a lab appointment with the Cancer Center, please go directly to the Cancer Center and check in at the registration area.   Wear comfortable clothing and clothing appropriate for easy access to any Portacath or PICC line.   We strive to give you quality time with your provider. You may need to reschedule your appointment if you arrive late (15 or more minutes).  Arriving late affects you and other patients whose appointments are after yours.  Also, if you miss three or more appointments without notifying the office, you may be dismissed from the clinic at the provider's discretion.      For prescription refill requests, have your pharmacy contact our office and allow 72 hours for refills to be completed.    Today you received the following chemotherapy and/or immunotherapy agents: Doxorubicin liposomal (Doxil)      To help prevent nausea and vomiting after your treatment, we encourage you to take your nausea medication as directed.  BELOW ARE SYMPTOMS THAT SHOULD BE REPORTED IMMEDIATELY: *FEVER GREATER THAN 100.4 F (38 C) OR HIGHER *CHILLS OR SWEATING *NAUSEA AND VOMITING THAT IS NOT CONTROLLED WITH YOUR NAUSEA MEDICATION *UNUSUAL SHORTNESS OF BREATH *UNUSUAL BRUISING OR BLEEDING *URINARY PROBLEMS (pain or burning when urinating, or frequent urination) *BOWEL PROBLEMS (unusual diarrhea, constipation, pain near the anus) TENDERNESS IN MOUTH AND THROAT WITH OR WITHOUT PRESENCE OF ULCERS (sore throat, sores in mouth, or a toothache) UNUSUAL RASH, SWELLING OR PAIN  UNUSUAL VAGINAL DISCHARGE OR ITCHING   Items with * indicate a potential emergency and should be followed up as soon as possible or go to the Emergency Department if any problems should occur.  Please show the CHEMOTHERAPY ALERT CARD or IMMUNOTHERAPY  ALERT CARD at check-in to the Emergency Department and triage nurse.  Should you have questions after your visit or need to cancel or reschedule your appointment, please contact [facility]  Dept: [phone]  and follow the prompts.  Office hours are 8:00 a.m. to 4:30 p.m. Monday - Friday. Please note that voicemails left after 4:00 p.m. may not be returned until the following business day.  We are closed weekends and major holidays. You have access to a nurse at all times for urgent questions. Please call the main number to the clinic Dept: [phone] and follow the prompts.   For any non-urgent questions, you may also contact your provider using MyChart. We now offer e-Visits for anyone 85 and older to request care online for non-urgent symptoms. For details visit [email or web address].   Also download the MyChart app! Go to the app store, search "MyChart", open the app, select [facility], and log in with your MyChart username and password.  Doxorubicin Liposomal Injection What is this medication? DOXORUBICIN LIPOSOMAL (dox oh ROO bi sin LIP oh som al) treats some types of cancer. It works by slowing down the growth of cancer cells. This medicine may be used for other purposes; ask your health care provider or pharmacist if you have questions. COMMON BRAND NAME(S): Doxil, Lipodox What should I tell my care team before I take this medication? They need to know if you have any of these conditions: Blood disorder Heart disease Infection especially a viral infection, such as chickenpox, cold sores, herpes Liver disease Recent or ongoing radiation An unusual or  allergic reaction to doxorubicin, soybeans, other medications, foods, dyes, or preservatives If you or your partner are pregnant or trying to get pregnant Breast-feeding How should I use this medication? This medication is injected into a vein. It is given by your care team in a hospital or  clinic setting. Talk to your care team about the use of this medication in children. Special care may be needed. Overdosage: If you think you have taken too much of this medicine contact a poison control center or emergency room at once. NOTE: This medicine is only for you. Do not share this medicine with others. What if I miss a dose? Keep appointments for follow-up doses. It is important not to miss your dose. Call your care team if you are unable to keep an appointment. What may interact with this medication? Do not take this medication with any of the following: Zidovudine This medication may also interact with the following: Medications to increase blood counts, such as filgrastim, pegfilgrastim, sargramostim Vaccines This list may not describe all possible interactions. Give your health care provider a list of all the medicines, herbs, non-prescription drugs, or dietary supplements you use. Also tell them if you smoke, drink alcohol, or use illegal drugs. Some items may interact with your medicine. What should I watch for while using this medication? Your condition will be monitored carefully while you are receiving this medication. You may need blood work while taking this medication. This medication may make you feel generally unwell. This is not uncommon as chemotherapy can affect healthy cells as well as cancer cells. Report any side effects. Continue your course of treatment even though you feel ill unless your care team tells you to stop. Your urine may turn orange-red for a few days after your dose. This is not blood. If your urine is dark or brown, call your care team. In some cases, you may be given additional medications to help with side effects. Follow all directions for their use. Talk to your care team about your risk of cancer. You may be more at risk for certain types of cancers if you take this medication. Talk to your care team if you or your partner may be pregnant.  Serious birth defects can occur if you take this medication during pregnancy and for 6 months after the last dose. Contraception is recommended while taking this medication and for 6 months after the last dose. Your care team can help you find the option that works for you. If your partner can get pregnant, use a condom while taking this medication and for 6 months after the last dose. Do not breastfeed while taking this medication. This medication may cause infertility. Talk to your care team if you are concerned about your fertility. What side effects may I notice from receiving this medication? Side effects that you should report to your care team as soon as possible: Allergic reactions--skin rash, itching, hives, swelling of the face, lips, tongue, or throat Heart failure--shortness of breath, swelling of the ankles, feet, or hands, sudden weight gain, unusual weakness or fatigue Infection--fever, chills, cough, sore throat, wounds that don't heal, pain or trouble when passing urine, general feeling of discomfort or being unwell Infusion reactions--chest pain, shortness of breath or trouble breathing, feeling faint or lightheaded Low red blood cell level--unusual weakness or fatigue, dizziness, headache, trouble breathing Redness, swelling, and blistering of the skin over hands and feet Unusual bruising or bleeding Side effects that usually do not require medical attention (report  to your care team if they continue or are bothersome): Constipation Diarrhea Loss of appetite Nausea Pain, redness, or swelling with sores inside the mouth or throat Red urine Unusual weakness or fatigue This list may not describe all possible side effects. Call your doctor for medical advice about side effects. You may report side effects to FDA at 1-800-FDA-1088. Where should I keep my medication? This medication is given in a hospital or clinic. It will not be stored at home. NOTE: This sheet is a summary. It  may not cover all possible information. If you have questions about this medicine, talk to your doctor, pharmacist, or health care provider.  2024 Elsevier/Gold Standard (2022-05-15 00:00:00)

## 2022-11-28 ENCOUNTER — Other Ambulatory Visit: Payer: Medicare HMO

## 2022-11-28 ENCOUNTER — Ambulatory Visit: Payer: Medicare HMO

## 2022-11-28 ENCOUNTER — Ambulatory Visit: Payer: Medicare HMO | Admitting: Hematology and Oncology

## 2022-11-28 ENCOUNTER — Other Ambulatory Visit: Payer: Self-pay | Admitting: Hematology and Oncology

## 2022-11-28 LAB — CA 125: Cancer Antigen (CA) 125: 111 U/mL — ABNORMAL HIGH (ref 0.0–38.1)

## 2022-12-03 ENCOUNTER — Other Ambulatory Visit: Payer: Self-pay

## 2022-12-05 ENCOUNTER — Telehealth: Payer: Self-pay | Admitting: Registered Nurse

## 2022-12-05 NOTE — Telephone Encounter (Signed)
Call placed to Ms. Bouillon regarding letter, voice mail full. Sent her My-Chart awaiting her response.

## 2022-12-07 ENCOUNTER — Other Ambulatory Visit: Payer: Self-pay | Admitting: Hematology and Oncology

## 2022-12-08 ENCOUNTER — Encounter: Payer: Self-pay | Admitting: Hematology and Oncology

## 2022-12-15 ENCOUNTER — Other Ambulatory Visit: Payer: Self-pay | Admitting: Hematology and Oncology

## 2022-12-15 DIAGNOSIS — C569 Malignant neoplasm of unspecified ovary: Secondary | ICD-10-CM

## 2022-12-18 ENCOUNTER — Encounter: Payer: Medicare HMO | Admitting: Registered Nurse

## 2022-12-18 ENCOUNTER — Telehealth: Payer: Self-pay | Admitting: Registered Nurse

## 2022-12-18 NOTE — Telephone Encounter (Signed)
Letter for [employer] : Alexandria Mclaughlin was unable to fly due to her Cancer diagnosis and increase intensity pain, she reported.

## 2022-12-18 NOTE — Progress Notes (Deleted)
Subjective:    Patient ID: Alexandria Mclaughlin, female    DOB: [birth year], 82 y.o.   MRN: [id number]  HPI  Pain Inventory Average Pain {NUMBERS; 0-10:5044} Pain Right Now {NUMBERS; 0-10:5044} My pain is {PAIN DESCRIPTION:21022940}  In the last 24 hours, has pain interfered with the following? General activity {NUMBERS; 0-10:5044} Relation with others {NUMBERS; 0-10:5044} Enjoyment of life {NUMBERS; 0-10:5044} What TIME of day is your pain at its worst? {time of day:24191} Sleep (in general) {BHH GOOD/FAIR/POOR:22877}  Pain is worse with: {ACTIVITIES:21022942} Pain improves with: {PAIN IMPROVES QQVZ:56387564} Relief from Meds: {NUMBERS; 0-10:5044}  Family History  Problem Relation Age of Onset   Diabetes Mother    Heart disease Mother    Stroke Mother    Heart disease Father    Heart disease Brother    Colon cancer Neg Hx    Breast cancer Neg Hx    Ovarian cancer Neg Hx    Endometrial cancer Neg Hx    Pancreatic cancer Neg Hx    Prostate cancer Neg Hx    Social History   Socioeconomic History   Marital status: Widowed    Spouse name: Not on file   Number of children: Not on file   Years of education: 14   Highest education level: Associate degree: academic program  Occupational History   Occupation: Retired  Tobacco Use   Smoking status: Former    Current packs/day: 0.00    Average packs/day: 0.3 packs/day for 15.0 years (3.8 ttl pk-yrs)    Types: Cigarettes    Start date: 03/15/1953    Quit date: 03/15/1968    Years since quitting: 54.7   Smokeless tobacco: Never  Vaping Use   Vaping status: Never Used  Substance and Sexual Activity   Alcohol use: Not Currently    Comment: occasional glass of wine   Drug use: Yes    Frequency: 20.0 times per week    Types: Fentanyl, Hydrocodone   Sexual activity: Not Currently  Other Topics Concern   Not on file  Social History Narrative   Right handed   Drinks caffeine   Condo two story with Engineer, structural   Social  Determinants of Health   Financial Resource Strain: Not on file  Food Insecurity: Not on file  Transportation Needs: Not on file  Physical Activity: Not on file  Stress: Not on file  Social Connections: Unknown (07/04/2021)   Received from [facility] [address]   Social Network    Social Network: Not on file   Past Surgical History:  Procedure Laterality Date   ABDOMINAL HYSTERECTOMY     Due to fibroids   BACK SURGERY  2011   lumbar Fusion   BLADDER SURGERY  1985   CESAREAN SECTION     x2   EYE SURGERY Bilateral    Cataract surgery with IOL   FOOT SURGERY Right    "just scraped the bone"   IR IMAGING GUIDED PORT INSERTION  05/26/2022   LAPAROSCOPY N/A 04/08/2022   Procedure: LAPAROSCOPY DIAGNOSTIC WITH BIOPSIES;  Surgeon: Clide Cliff, MD;  Location: [facility];  Service: Gynecology;  Laterality: N/A;   RETINAL DETACHMENT SURGERY Right 10/2011   TRANSFORAMINAL LUMBAR INTERBODY FUSION W/ MIS 1 LEVEL Right 04/26/2018   Procedure: Right Lumbar four-five Minimally invasive Transforaminal lumbar interbody fusion;  Surgeon: Jadene Pierini, MD;  Location: [facility];  Service: Neurosurgery;  Laterality: Right;   Past Surgical History:  Procedure Laterality Date   ABDOMINAL HYSTERECTOMY  Due to fibroids   BACK SURGERY  2011   lumbar Fusion   BLADDER SURGERY  1985   CESAREAN SECTION     x2   EYE SURGERY Bilateral    Cataract surgery with IOL   FOOT SURGERY Right    "just scraped the bone"   IR IMAGING GUIDED PORT INSERTION  05/26/2022   LAPAROSCOPY N/A 04/08/2022   Procedure: LAPAROSCOPY DIAGNOSTIC WITH BIOPSIES;  Surgeon: Clide Cliff, MD;  Location: [facility];  Service: Gynecology;  Laterality: N/A;   RETINAL DETACHMENT SURGERY Right 10/2011   TRANSFORAMINAL LUMBAR INTERBODY FUSION W/ MIS 1 LEVEL Right 04/26/2018   Procedure: Right Lumbar four-five Minimally invasive Transforaminal lumbar interbody fusion;  Surgeon: Jadene Pierini, MD;  Location: [facility];  Service:  Neurosurgery;  Laterality: Right;   Past Medical History:  Diagnosis Date   Abnormality of gait 02/07/2015   Acquired hallux rigidus of left foot 12/17/2020   Acquired unequal leg length on left 08/08/2011   AMS (altered mental status) 12/28/2020   Biceps tendonitis on left 08/18/2018   BPPV (benign paroxysmal positional vertigo) 02/07/2015   Bunion 12/17/2020   Cancer (HCC) 04/25/2022   Cerebrovascular disease    Cervical facet syndrome    Coronary artery disease 04/27/2022   Disorders of sacrum    Enthesopathy of hip region    Essential hypertension 02/07/2015   Generalized anxiety disorder    GERD (gastroesophageal reflux disease)    Headache    History of kidney stones    HLD (hyperlipidemia) 02/07/2015   Low back pain 02/07/2015   Lumbar facet arthropathy 07/05/2014   Lumbar post-laminectomy syndrome 06/11/2011   Lumbar radicular pain 07/29/2016   Lumbar radiculopathy 04/26/2018   Major depressive disorder    Mild cognitive impairment of uncertain or unknown etiology 01/24/2022   Multiple lacunar infarcts    MRI - bilateral basal ganglia and left thalamus   Myoclonus 09/19/2015   Rotator cuff tendonitis, left 06/08/2018   Sacroiliac joint dysfunction 06/11/2011   Sciatic neuritis    Stroke (HCC)    hx TIA   Subcortical infarction    2016 MRI - small chronic subcortical infarct with associated chronic hemosiderin deposition within the right superior frontal gyrus.   Synovitis and tenosynovitis    Therapeutic opioid induced constipation 07/25/2015   Thyroid disease    Transient left leg weakness    Vision abnormalities    There were no vitals taken for this visit.  Opioid Risk Score:   Fall Risk Score:  `1  Depression screen PHQ 2/9     11/19/2022    2:46 PM 10/15/2022    1:24 PM 08/21/2022    3:01 PM 04/24/2022    1:11 PM 02/25/2022    1:02 PM 08/28/2021    2:31 PM 06/19/2021    2:53 PM  Depression screen PHQ 2/9  Decreased Interest 1 1 1 1  0 0 1  Down,  Depressed, Hopeless 1 1 1 1  0 0 1  PHQ - 2 Score 2 2 2 2  0 0 2     Review of Systems     Objective:   Physical Exam        Assessment & Plan:

## 2022-12-19 ENCOUNTER — Other Ambulatory Visit: Payer: Self-pay

## 2022-12-19 ENCOUNTER — Encounter: Payer: Self-pay | Admitting: Registered Nurse

## 2022-12-19 ENCOUNTER — Telehealth: Payer: Self-pay | Admitting: Registered Nurse

## 2022-12-19 ENCOUNTER — Encounter: Payer: Medicare HMO | Attending: Physical Medicine and Rehabilitation | Admitting: Registered Nurse

## 2022-12-19 ENCOUNTER — Inpatient Hospital Stay (HOSPITAL_COMMUNITY)
Admission: EM | Admit: 2022-12-19 | Discharge: 2022-12-27 | DRG: 947 | Disposition: A | Discharge status: Home Health | Payer: Medicare HMO | Source: Ambulatory Visit | Attending: Student | Admitting: Student

## 2022-12-19 ENCOUNTER — Emergency Department (HOSPITAL_COMMUNITY): Payer: Medicare HMO

## 2022-12-19 ENCOUNTER — Telehealth: Payer: Self-pay | Admitting: Hematology and Oncology

## 2022-12-19 ENCOUNTER — Other Ambulatory Visit: Payer: Self-pay | Admitting: Hematology and Oncology

## 2022-12-19 VITALS — BP 137/86 | HR 84 | Ht 63.0 in | Wt 133.0 lb

## 2022-12-19 DIAGNOSIS — R269 Unspecified abnormalities of gait and mobility: Secondary | ICD-10-CM

## 2022-12-19 DIAGNOSIS — E0590 Thyrotoxicosis, unspecified without thyrotoxic crisis or storm: Secondary | ICD-10-CM | POA: Diagnosis present

## 2022-12-19 DIAGNOSIS — G3184 Mild cognitive impairment, so stated: Secondary | ICD-10-CM | POA: Diagnosis present

## 2022-12-19 DIAGNOSIS — G893 Neoplasm related pain (acute) (chronic): Principal | ICD-10-CM | POA: Diagnosis present

## 2022-12-19 DIAGNOSIS — E785 Hyperlipidemia, unspecified: Secondary | ICD-10-CM | POA: Diagnosis present

## 2022-12-19 DIAGNOSIS — C561 Malignant neoplasm of right ovary: Secondary | ICD-10-CM | POA: Diagnosis not present

## 2022-12-19 DIAGNOSIS — N838 Other noninflammatory disorders of ovary, fallopian tube and broad ligament: Secondary | ICD-10-CM | POA: Diagnosis not present

## 2022-12-19 DIAGNOSIS — Z98891 History of uterine scar from previous surgery: Secondary | ICD-10-CM

## 2022-12-19 DIAGNOSIS — M549 Dorsalgia, unspecified: Secondary | ICD-10-CM | POA: Diagnosis not present

## 2022-12-19 DIAGNOSIS — K219 Gastro-esophageal reflux disease without esophagitis: Secondary | ICD-10-CM | POA: Diagnosis present

## 2022-12-19 DIAGNOSIS — Z90710 Acquired absence of both cervix and uterus: Secondary | ICD-10-CM

## 2022-12-19 DIAGNOSIS — R1031 Right lower quadrant pain: Secondary | ICD-10-CM

## 2022-12-19 DIAGNOSIS — C569 Malignant neoplasm of unspecified ovary: Secondary | ICD-10-CM | POA: Diagnosis not present

## 2022-12-19 DIAGNOSIS — Z79891 Long term (current) use of opiate analgesic: Secondary | ICD-10-CM

## 2022-12-19 DIAGNOSIS — M7591 Shoulder lesion, unspecified, right shoulder: Secondary | ICD-10-CM | POA: Diagnosis present

## 2022-12-19 DIAGNOSIS — Z8249 Family history of ischemic heart disease and other diseases of the circulatory system: Secondary | ICD-10-CM

## 2022-12-19 DIAGNOSIS — D72819 Decreased white blood cell count, unspecified: Secondary | ICD-10-CM | POA: Diagnosis present

## 2022-12-19 DIAGNOSIS — M4854XA Collapsed vertebra, not elsewhere classified, thoracic region, initial encounter for fracture: Secondary | ICD-10-CM | POA: Diagnosis present

## 2022-12-19 DIAGNOSIS — M47812 Spondylosis without myelopathy or radiculopathy, cervical region: Secondary | ICD-10-CM | POA: Diagnosis present

## 2022-12-19 DIAGNOSIS — G894 Chronic pain syndrome: Secondary | ICD-10-CM | POA: Diagnosis not present

## 2022-12-19 DIAGNOSIS — Z9889 Other specified postprocedural states: Secondary | ICD-10-CM

## 2022-12-19 DIAGNOSIS — G8929 Other chronic pain: Secondary | ICD-10-CM | POA: Insufficient documentation

## 2022-12-19 DIAGNOSIS — M545 Low back pain, unspecified: Secondary | ICD-10-CM | POA: Diagnosis present

## 2022-12-19 DIAGNOSIS — F411 Generalized anxiety disorder: Secondary | ICD-10-CM | POA: Diagnosis present

## 2022-12-19 DIAGNOSIS — R1084 Generalized abdominal pain: Secondary | ICD-10-CM | POA: Diagnosis not present

## 2022-12-19 DIAGNOSIS — I10 Essential (primary) hypertension: Secondary | ICD-10-CM | POA: Diagnosis not present

## 2022-12-19 DIAGNOSIS — R188 Other ascites: Secondary | ICD-10-CM | POA: Diagnosis present

## 2022-12-19 DIAGNOSIS — M47816 Spondylosis without myelopathy or radiculopathy, lumbar region: Secondary | ICD-10-CM | POA: Diagnosis present

## 2022-12-19 DIAGNOSIS — Z66 Do not resuscitate: Secondary | ICD-10-CM | POA: Diagnosis present

## 2022-12-19 DIAGNOSIS — G253 Myoclonus: Secondary | ICD-10-CM | POA: Insufficient documentation

## 2022-12-19 DIAGNOSIS — M40204 Unspecified kyphosis, thoracic region: Secondary | ICD-10-CM | POA: Diagnosis not present

## 2022-12-19 DIAGNOSIS — G9341 Metabolic encephalopathy: Secondary | ICD-10-CM | POA: Diagnosis not present

## 2022-12-19 DIAGNOSIS — Z981 Arthrodesis status: Secondary | ICD-10-CM | POA: Diagnosis not present

## 2022-12-19 DIAGNOSIS — R195 Other fecal abnormalities: Secondary | ICD-10-CM | POA: Diagnosis present

## 2022-12-19 DIAGNOSIS — Z8673 Personal history of transient ischemic attack (TIA), and cerebral infarction without residual deficits: Secondary | ICD-10-CM

## 2022-12-19 DIAGNOSIS — Z90721 Acquired absence of ovaries, unilateral: Secondary | ICD-10-CM

## 2022-12-19 DIAGNOSIS — C7951 Secondary malignant neoplasm of bone: Secondary | ICD-10-CM | POA: Diagnosis present

## 2022-12-19 DIAGNOSIS — Z743 Need for continuous supervision: Secondary | ICD-10-CM | POA: Diagnosis not present

## 2022-12-19 DIAGNOSIS — D630 Anemia in neoplastic disease: Secondary | ICD-10-CM | POA: Diagnosis present

## 2022-12-19 DIAGNOSIS — Z515 Encounter for palliative care: Secondary | ICD-10-CM

## 2022-12-19 DIAGNOSIS — C786 Secondary malignant neoplasm of retroperitoneum and peritoneum: Secondary | ICD-10-CM | POA: Diagnosis present

## 2022-12-19 DIAGNOSIS — Z9841 Cataract extraction status, right eye: Secondary | ICD-10-CM

## 2022-12-19 DIAGNOSIS — Z7189 Other specified counseling: Secondary | ICD-10-CM

## 2022-12-19 DIAGNOSIS — Z87442 Personal history of urinary calculi: Secondary | ICD-10-CM

## 2022-12-19 DIAGNOSIS — D638 Anemia in other chronic diseases classified elsewhere: Secondary | ICD-10-CM | POA: Diagnosis present

## 2022-12-19 DIAGNOSIS — Z8543 Personal history of malignant neoplasm of ovary: Secondary | ICD-10-CM | POA: Diagnosis not present

## 2022-12-19 DIAGNOSIS — M2022 Hallux rigidus, left foot: Secondary | ICD-10-CM | POA: Diagnosis present

## 2022-12-19 DIAGNOSIS — G928 Other toxic encephalopathy: Secondary | ICD-10-CM | POA: Diagnosis not present

## 2022-12-19 DIAGNOSIS — I2510 Atherosclerotic heart disease of native coronary artery without angina pectoris: Secondary | ICD-10-CM | POA: Diagnosis present

## 2022-12-19 DIAGNOSIS — D539 Nutritional anemia, unspecified: Secondary | ICD-10-CM | POA: Diagnosis present

## 2022-12-19 DIAGNOSIS — K5900 Constipation, unspecified: Secondary | ICD-10-CM

## 2022-12-19 DIAGNOSIS — M4728 Other spondylosis with radiculopathy, sacral and sacrococcygeal region: Secondary | ICD-10-CM | POA: Diagnosis present

## 2022-12-19 DIAGNOSIS — R12 Heartburn: Secondary | ICD-10-CM | POA: Diagnosis not present

## 2022-12-19 DIAGNOSIS — R4589 Other symptoms and signs involving emotional state: Secondary | ICD-10-CM

## 2022-12-19 DIAGNOSIS — R29890 Loss of height: Secondary | ICD-10-CM | POA: Diagnosis not present

## 2022-12-19 DIAGNOSIS — Z833 Family history of diabetes mellitus: Secondary | ICD-10-CM

## 2022-12-19 DIAGNOSIS — F39 Unspecified mood [affective] disorder: Secondary | ICD-10-CM | POA: Diagnosis present

## 2022-12-19 DIAGNOSIS — Z87891 Personal history of nicotine dependence: Secondary | ICD-10-CM

## 2022-12-19 DIAGNOSIS — N83201 Unspecified ovarian cyst, right side: Secondary | ICD-10-CM | POA: Diagnosis not present

## 2022-12-19 DIAGNOSIS — R109 Unspecified abdominal pain: Principal | ICD-10-CM

## 2022-12-19 DIAGNOSIS — Z79899 Other long term (current) drug therapy: Secondary | ICD-10-CM

## 2022-12-19 DIAGNOSIS — R443 Hallucinations, unspecified: Secondary | ICD-10-CM | POA: Diagnosis not present

## 2022-12-19 DIAGNOSIS — T451X5A Adverse effect of antineoplastic and immunosuppressive drugs, initial encounter: Secondary | ICD-10-CM | POA: Diagnosis present

## 2022-12-19 DIAGNOSIS — Z5181 Encounter for therapeutic drug level monitoring: Secondary | ICD-10-CM

## 2022-12-19 DIAGNOSIS — Z7960 Long term (current) use of unspecified immunomodulators and immunosuppressants: Secondary | ICD-10-CM

## 2022-12-19 DIAGNOSIS — M5134 Other intervertebral disc degeneration, thoracic region: Secondary | ICD-10-CM | POA: Diagnosis present

## 2022-12-19 DIAGNOSIS — Z823 Family history of stroke: Secondary | ICD-10-CM

## 2022-12-19 DIAGNOSIS — Z961 Presence of intraocular lens: Secondary | ICD-10-CM | POA: Diagnosis present

## 2022-12-19 DIAGNOSIS — F419 Anxiety disorder, unspecified: Secondary | ICD-10-CM

## 2022-12-19 DIAGNOSIS — Z9842 Cataract extraction status, left eye: Secondary | ICD-10-CM

## 2022-12-19 LAB — CBC WITH DIFFERENTIAL/PLATELET
Abs Immature Granulocytes: 0.01 10*3/uL (ref 0.00–0.07)
Basophils Absolute: 0 10*3/uL (ref 0.0–0.1)
Basophils Relative: 0 %
Eosinophils Absolute: 0 10*3/uL (ref 0.0–0.5)
Eosinophils Relative: 1 %
HCT: 29.4 % — ABNORMAL LOW (ref 36.0–46.0)
Hemoglobin: 9.2 g/dL — ABNORMAL LOW (ref 12.0–15.0)
Immature Granulocytes: 0 %
Lymphocytes Relative: 23 %
Lymphs Abs: 0.7 10*3/uL (ref 0.7–4.0)
MCH: 31.6 pg (ref 26.0–34.0)
MCHC: 31.3 g/dL (ref 30.0–36.0)
MCV: 101 fL — ABNORMAL HIGH (ref 80.0–100.0)
Monocytes Absolute: 0.7 10*3/uL (ref 0.1–1.0)
Monocytes Relative: 20 %
Neutro Abs: 1.8 10*3/uL (ref 1.7–7.7)
Neutrophils Relative %: 56 %
Platelets: 215 10*3/uL (ref 150–400)
RBC: 2.91 MIL/uL — ABNORMAL LOW (ref 3.87–5.11)
RDW: 14.4 % (ref 11.5–15.5)
WBC: 3.2 10*3/uL — ABNORMAL LOW (ref 4.0–10.5)
nRBC: 0 % (ref 0.0–0.2)

## 2022-12-19 LAB — COMPREHENSIVE METABOLIC PANEL
ALT: 9 U/L (ref 0–44)
AST: 13 U/L — ABNORMAL LOW (ref 15–41)
Albumin: 2.9 g/dL — ABNORMAL LOW (ref 3.5–5.0)
Alkaline Phosphatase: 104 U/L (ref 38–126)
Anion gap: 9 (ref 5–15)
BUN: 12 mg/dL (ref 8–23)
CO2: 27 mmol/L (ref 22–32)
Calcium: 9.2 mg/dL (ref 8.9–10.3)
Chloride: 101 mmol/L (ref 98–111)
Creatinine, Ser: 0.46 mg/dL (ref 0.44–1.00)
GFR, Estimated: >60 mL/min (ref >60)
Glucose, Bld: 109 mg/dL — ABNORMAL HIGH (ref 70–99)
Potassium: 3.8 mmol/L (ref 3.5–5.1)
Sodium: 137 mmol/L (ref 135–145)
Total Bilirubin: 0.5 mg/dL (ref 0.3–1.2)
Total Protein: 6.3 g/dL — ABNORMAL LOW (ref 6.5–8.1)

## 2022-12-19 LAB — URINALYSIS, ROUTINE W REFLEX MICROSCOPIC
Bilirubin Urine: NEGATIVE
Glucose, UA: NEGATIVE mg/dL
Hgb urine dipstick: NEGATIVE
Ketones, ur: NEGATIVE mg/dL
Nitrite: NEGATIVE
Protein, ur: NEGATIVE mg/dL
Specific Gravity, Urine: 1.018 (ref 1.005–1.030)
pH: 5 (ref 5.0–8.0)

## 2022-12-19 LAB — LIPASE, BLOOD: Lipase: 21 U/L (ref 11–51)

## 2022-12-19 MED ORDER — FENTANYL 25 MCG/HR TD PT72
1.0000 | MEDICATED_PATCH | TRANSDERMAL | Status: DC
  Administered 2022-12-20: 1 via TRANSDERMAL
  Filled 2022-12-19: qty 1

## 2022-12-19 MED ORDER — GABAPENTIN 300 MG PO CAPS
300.0000 mg | ORAL_CAPSULE | Freq: Four times a day (QID) | ORAL | Status: DC
  Administered 2022-12-19 – 2022-12-20 (×2): 300 mg via ORAL
  Filled 2022-12-19 (×2): qty 1

## 2022-12-19 MED ORDER — HYDROMORPHONE HCL 1 MG/ML IJ SOLN
1.0000 mg | Freq: Once | INTRAMUSCULAR | Status: AC
  Administered 2022-12-19: 1 mg via INTRAVENOUS
  Filled 2022-12-19: qty 1

## 2022-12-19 MED ORDER — PREGABALIN 50 MG PO CAPS
100.0000 mg | ORAL_CAPSULE | Freq: Three times a day (TID) | ORAL | Status: DC
  Administered 2022-12-19 – 2022-12-22 (×8): 100 mg via ORAL
  Filled 2022-12-19 (×8): qty 2

## 2022-12-19 MED ORDER — MELATONIN 5 MG PO TABS
10.0000 mg | ORAL_TABLET | Freq: Every evening | ORAL | Status: DC | PRN
  Administered 2022-12-22 – 2022-12-25 (×3): 10 mg via ORAL
  Filled 2022-12-19 (×3): qty 2

## 2022-12-19 MED ORDER — OXYCODONE HCL 5 MG PO TABS: 10.0000 mg | ORAL_TABLET | Freq: Four times a day (QID) | ORAL | Status: DC | PRN

## 2022-12-19 MED ORDER — POLYETHYLENE GLYCOL 3350 17 G PO PACK
17.0000 g | PACK | Freq: Two times a day (BID) | ORAL | Status: DC
  Administered 2022-12-19 – 2022-12-27 (×13): 17 g via ORAL
  Filled 2022-12-19 (×14): qty 1

## 2022-12-19 MED ORDER — CLONAZEPAM 0.5 MG PO TABS
0.5000 mg | ORAL_TABLET | Freq: Every evening | ORAL | Status: DC | PRN
  Administered 2022-12-23 – 2022-12-26 (×2): 0.5 mg via ORAL
  Filled 2022-12-19 (×2): qty 1

## 2022-12-19 MED ORDER — POLYETHYLENE GLYCOL 3350 17 G PO PACK: 17.0000 g | PACK | Freq: Every day | ORAL | Status: DC

## 2022-12-19 MED ORDER — DULOXETINE HCL 60 MG PO CPEP
60.0000 mg | ORAL_CAPSULE | Freq: Every day | ORAL | Status: DC
  Administered 2022-12-20 – 2022-12-27 (×8): 60 mg via ORAL
  Filled 2022-12-19 (×8): qty 1

## 2022-12-19 MED ORDER — SENNA 8.6 MG PO TABS: 2.0000 | ORAL_TABLET | Freq: Every day | ORAL | Status: DC

## 2022-12-19 MED ORDER — OXYCODONE HCL 5 MG PO TABS
15.0000 mg | ORAL_TABLET | Freq: Four times a day (QID) | ORAL | Status: DC | PRN
  Administered 2022-12-20: 15 mg via ORAL
  Filled 2022-12-19: qty 3

## 2022-12-19 MED ORDER — IOHEXOL 300 MG/ML  SOLN
100.0000 mL | Freq: Once | INTRAMUSCULAR | Status: AC | PRN
  Administered 2022-12-19: 100 mL via INTRAVENOUS

## 2022-12-19 MED ORDER — ACETAMINOPHEN 500 MG PO TABS
1000.0000 mg | ORAL_TABLET | Freq: Four times a day (QID) | ORAL | Status: DC
  Administered 2022-12-19 – 2022-12-20 (×2): 1000 mg via ORAL
  Filled 2022-12-19 (×2): qty 2

## 2022-12-19 MED ORDER — SENNA 8.6 MG PO TABS
2.0000 | ORAL_TABLET | Freq: Two times a day (BID) | ORAL | Status: DC
  Administered 2022-12-20 – 2022-12-27 (×15): 17.2 mg via ORAL
  Filled 2022-12-19 (×15): qty 2

## 2022-12-19 MED ORDER — ONDANSETRON HCL 4 MG/2ML IJ SOLN
4.0000 mg | Freq: Four times a day (QID) | INTRAMUSCULAR | Status: DC | PRN
  Administered 2022-12-23: 4 mg via INTRAVENOUS
  Filled 2022-12-19: qty 2

## 2022-12-19 MED ORDER — BISACODYL 10 MG RE SUPP
10.0000 mg | Freq: Every day | RECTAL | Status: DC | PRN
  Administered 2022-12-23: 10 mg via RECTAL
  Filled 2022-12-19: qty 1

## 2022-12-19 MED ORDER — METHIMAZOLE 5 MG PO TABS
5.0000 mg | ORAL_TABLET | ORAL | Status: DC
  Administered 2022-12-22 – 2022-12-26 (×3): 5 mg via ORAL
  Filled 2022-12-19 (×3): qty 1

## 2022-12-19 MED ORDER — TRAZODONE HCL 50 MG PO TABS
100.0000 mg | ORAL_TABLET | Freq: Every day | ORAL | Status: DC
  Administered 2022-12-19 – 2022-12-26 (×8): 100 mg via ORAL
  Filled 2022-12-19 (×7): qty 2
  Filled 2022-12-19: qty 1

## 2022-12-19 MED ORDER — IRBESARTAN 150 MG PO TABS
150.0000 mg | ORAL_TABLET | Freq: Every day | ORAL | Status: DC
  Administered 2022-12-20 – 2022-12-27 (×8): 150 mg via ORAL
  Filled 2022-12-19 (×8): qty 1

## 2022-12-19 MED ORDER — HYDROMORPHONE HCL 1 MG/ML IJ SOLN
0.5000 mg | INTRAMUSCULAR | Status: DC | PRN
  Administered 2022-12-20 (×2): 0.5 mg via INTRAVENOUS
  Filled 2022-12-19 (×2): qty 0.5

## 2022-12-19 NOTE — ED Triage Notes (Signed)
BIBA from [facility] for severe abd pain, hx of ovarian CA. RLQ pain/tenderness. 164/102 BP 68 HR 18 RR 95% room air

## 2022-12-19 NOTE — H&P (Signed)
History and Physical    Alexandria Mclaughlin:[id number] DOB: 03-Jun-1940 DOA: 12/19/2022  PCP: Artis Delay, MD   Patient coming from: Clinic - PM+R    Chief Complaint:  Chief Complaint  Patient presents with   Abdominal Pain    HPI:  Alexandria Mclaughlin is a 82 y.o. female with hx of metastatic ovarian cancer with peritoneal carcinomatosis, mets to lumbar and thoracic spine, degenerative disc disease, prior spinal fusion, CVA, mood disorder, who was brought in from PM+R clinic due to severe RLQ pain. Patient reports gradual worsening of RLQ pain x few weeks. She has chronic pain in this area from her ovarian cancer, typically 5/10. However had increased up to 7/10 and in past day up to 9/10. Is significantly worse with any movement and thus mobility has decreased. Otherwise no fevers, n/v/d. Notes recent dark colored stools. Has been using her home medications including Fentanyl 25 mcg/hr patch and Percocet 10-325 mg 5x per day, also uses Gapabentin and Lyrica for her hx radicular pain. Notes that her back pain is stable and much improved with lyrica.   Review of Systems:  ROS complete and negative except as marked above   No Known Allergies  Prior to Admission medications   Medication Sig Start Date End Date Taking? Authorizing Provider  aspirin-acetaminophen-caffeine (EXCEDRIN MIGRAINE) [phone] MG tablet Take 1 tablet by mouth every 6 (six) hours as needed for headache or migraine.    [provider]  Cholecalciferol (VITAMIN D) 50 MCG (2000 UT) CAPS Take 2,000 Units by mouth daily.    [provider]  clonazePAM (KLONOPIN) 0.5 MG tablet Take 1 tablet (0.5 mg total) by mouth at bedtime as needed for anxiety. 11/21/22   Raulkar, Drema Pry, MD  cyanocobalamin 1000 MCG tablet 1,000 mcg daily.    [provider]  dexamethasone (DECADRON) 4 MG tablet Take 2 tabs at the night before and 2 tab the morning of chemotherapy, every 3 weeks, by mouth x 6 cycles 04/22/22   Artis Delay, MD  DULoxetine (CYMBALTA) 60 MG capsule TAKE 1 CAPSULE BY MOUTH EVERY DAY 08/12/22   Ranelle Oyster, MD  fentaNYL (DURAGESIC) 25 MCG/HR Place 1 patch onto the skin every 3 (three) days. 10/15/22 10/15/23  Jones Bales, NP  gabapentin (NEURONTIN) 600 MG tablet TAKE 1 TABLET BY MOUTH FOUR TIMES A DAY 10/07/22   Jones Bales, NP  irbesartan (AVAPRO) 150 MG tablet Take 150 mg by mouth daily.    [provider]  lactulose (CHRONULAC) 10 GM/15ML solution TAKE 15 MLS (10 G TOTAL) BY MOUTH 3 (THREE) TIMES DAILY. 08/20/22   Artis Delay, MD  lidocaine-prilocaine (EMLA) cream APPLY TO AFFECTED AREA ONCE AS DIRECTED 12/15/22   Artis Delay, MD  magnesium oxide (MAG-OX) 400 (240 Mg) MG tablet TAKE 1 TABLET BY MOUTH EVERY DAY 12/08/22   Artis Delay, MD  Melatonin 10 MG TABS Take 10 mg by mouth at bedtime as needed (sleep).    [provider]  methimazole (TAPAZOLE) 5 MG tablet Take 5 mg by mouth. 3 times a week only    [provider]  ondansetron (ZOFRAN) 8 MG tablet Take 1 tablet (8 mg total) by mouth every 8 (eight) hours as needed for nausea or vomiting. Start on the third day after carboplatin. 04/22/22   Artis Delay, MD  oxyCODONE-acetaminophen (PERCOCET) 10-325 MG tablet Take 1 tablet by mouth 5 (five) times daily as needed for pain. 10/15/22 10/15/23  Jones Bales, NP  polyethylene glycol (MIRALAX / GLYCOLAX) 17 g packet Take 17 g by mouth 2 (two) times daily.    [provider]  pregabalin (LYRICA) 50 MG capsule Take 1 capsule (50 mg total) by mouth 3 (three) times daily. 11/19/22   Ranelle Oyster, MD  prochlorperazine (COMPAZINE) 10 MG tablet TAKE 1 TABLET BY MOUTH EVERY 6 HOURS AS NEEDED FOR NAUSEA OR VOMITING. 11/03/22   Artis Delay, MD  rosuvastatin (CRESTOR) 20 MG tablet Take 20 mg by mouth daily. 10/20/22   [provider]  rosuvastatin (CRESTOR) 40 MG tablet Take 1 tablet (40 mg total) by mouth at bedtime. Patient not taking: Reported on  11/19/2022 05/05/22   Parke Poisson, MD  senna (SENOKOT) 8.6 MG tablet Take 2 tablets by mouth 2 (two) times daily.    [provider]  traZODone (DESYREL) 100 MG tablet TAKE 1 TABLET BY MOUTH EVERYDAY AT BEDTIME 09/15/22   Jones Bales, NP    Past Medical History:  Diagnosis Date   Abnormality of gait 02/07/2015   Acquired hallux rigidus of left foot 12/17/2020   Acquired unequal leg length on left 08/08/2011   AMS (altered mental status) 12/28/2020   Biceps tendonitis on left 08/18/2018   BPPV (benign paroxysmal positional vertigo) 02/07/2015   Bunion 12/17/2020   Cancer (HCC) 04/25/2022   Cerebrovascular disease    Cervical facet syndrome    Coronary artery disease 04/27/2022   Disorders of sacrum    Enthesopathy of hip region    Essential hypertension 02/07/2015   Generalized anxiety disorder    GERD (gastroesophageal reflux disease)    Headache    History of kidney stones    HLD (hyperlipidemia) 02/07/2015   Low back pain 02/07/2015   Lumbar facet arthropathy 07/05/2014   Lumbar post-laminectomy syndrome 06/11/2011   Lumbar radicular pain 07/29/2016   Lumbar radiculopathy 04/26/2018   Major depressive disorder    Mild cognitive impairment of uncertain or unknown etiology 01/24/2022   Multiple lacunar infarcts    MRI - bilateral basal ganglia and left thalamus   Myoclonus 09/19/2015   Rotator cuff tendonitis, left 06/08/2018   Sacroiliac joint dysfunction 06/11/2011   Sciatic neuritis    Stroke (HCC)    hx TIA   Subcortical infarction    2016 MRI - small chronic subcortical infarct with associated chronic hemosiderin deposition within the right superior frontal gyrus.   Synovitis and tenosynovitis    Therapeutic opioid induced constipation 07/25/2015   Thyroid disease    Transient left leg weakness    Vision abnormalities     Past Surgical History:  Procedure Laterality Date   ABDOMINAL HYSTERECTOMY     Due to fibroids   BACK SURGERY  2011    lumbar Fusion   BLADDER SURGERY  1985   CESAREAN SECTION     x2   EYE SURGERY Bilateral    Cataract surgery with IOL   FOOT SURGERY Right    "just scraped the bone"   IR IMAGING GUIDED PORT INSERTION  05/26/2022   LAPAROSCOPY N/A 04/08/2022   Procedure: LAPAROSCOPY DIAGNOSTIC WITH BIOPSIES;  Surgeon: Clide Cliff, MD;  Location: [facility];  Service: Gynecology;  Laterality: N/A;   RETINAL DETACHMENT SURGERY Right 10/2011   TRANSFORAMINAL LUMBAR INTERBODY FUSION W/ MIS 1 LEVEL Right 04/26/2018   Procedure: Right Lumbar four-five Minimally invasive Transforaminal lumbar interbody fusion;  Surgeon: Jadene Pierini, MD;  Location: [facility];  Service: Neurosurgery;  Laterality: Right;     reports that  she quit smoking about 54 years ago. Her smoking use included cigarettes. She started smoking about 69 years ago. She has a 3.8 pack-year smoking history. She has never used smokeless tobacco. She reports that she does not currently use alcohol. She reports current drug use. Frequency: 20.00 times per week. Drugs: Fentanyl and Hydrocodone.  Family History  Problem Relation Age of Onset   Diabetes Mother    Heart disease Mother    Stroke Mother    Heart disease Father    Heart disease Brother    Colon cancer Neg Hx    Breast cancer Neg Hx    Ovarian cancer Neg Hx    Endometrial cancer Neg Hx    Pancreatic cancer Neg Hx    Prostate cancer Neg Hx      Physical Exam: Vitals:   12/19/22 1618 12/19/22 1645 12/19/22 2003 12/19/22 2030  BP: (!) 145/79 138/82 (!) 153/76 128/61  Pulse: 72 71 78 74  Resp: 14 17 18 17   Temp: 98 F (36.7 C)  98 F (36.7 C)   TempSrc: Oral  Oral   SpO2: 98% 93% 96% 94%    Gen: Awake, alert, Elderly, frail   CV: Regular, normal S1, S2, 1/6 Sem. R chest wall port  Resp: Normal WOB, CTAB  Abd: Flat, slightly firm, there is severe tenderness focally in the RLQ. No rebound, guarding, rigidity.  MSK: Symmetric, no edema  Skin: No rashes or lesions to exposed  skin  Neuro: Alert and interactive  Psych: euthymic, appropriate    Data review:   Labs reviewed, notable for:   Chemistries are unremarkable. LFT and lipase wnl  Blood counts Hb 11 -> 9  WBC 3.2, PLT 215   Micro:  Results for orders placed or performed in visit on 07/04/22  Culture, Urine     Status: Abnormal   Collection Time: 07/04/22 10:55 AM   Specimen: Urine, Clean Catch  Result Value Ref Range Status   Specimen Description   Final    URINE, CLEAN CATCH Performed at [facility], [address], [place], [place] [address]    Special Requests   Final    NONE Performed at [facility], [address], [place], [place] [address]    Culture 10,000 COLONIES/mL ENTEROCOCCUS FAECALIS (A)  Final   Report Status 07/07/2022 FINAL  Final   Organism ID, Bacteria ENTEROCOCCUS FAECALIS (A)  Final      Susceptibility   Enterococcus faecalis - MIC*    AMPICILLIN <=2 SENSITIVE Sensitive     NITROFURANTOIN <=16 SENSITIVE Sensitive     VANCOMYCIN 1 SENSITIVE Sensitive     * 10,000 COLONIES/mL ENTEROCOCCUS FAECALIS    Imaging reviewed:  CT L-SPINE NO CHARGE  Result Date: 12/19/2022 CLINICAL DATA:  Back pain, lower extremity radicular pain EXAM: CT LUMBAR SPINE WITHOUT CONTRAST TECHNIQUE: Multidetector CT imaging of the lumbar spine was performed without intravenous contrast administration. Multiplanar CT image reconstructions were also generated. RADIATION DOSE REDUCTION: This exam was performed according to the departmental dose-optimization program which includes automated exposure control, adjustment of the mA and/or kV according to patient size and/or use of iterative reconstruction technique. COMPARISON:  None Available. FINDINGS: Segmentation: 5 lumbar type vertebrae. Alignment: Normal. Vertebrae: L4-5 anterior posterior lumbar fusion with instrumentation again noted. Degenerative ankylosis of the anterior posterior elements of L5-S1  noted. Chronic L1 compression deformity with approximately 50% loss of height is unchanged. Sclerotic foci within the L2 and L5 vertebral bodies  is again identified suspicious for sclerotic osseous metastases as these are stable since prior examination, but are new since remote prior examination of 03/17/2022. No acute fracture. Paraspinal and other soft tissues: Negative paraspinal soft tissues. Mass within the right hemipelvis is better assessed on accompanying CT examination of the abdomen and pelvis. Disc levels: Intervertebral disc space narrowing and endplate remodeling at L3-4 in keeping with changes of advanced degenerative disc disease. Remaining intervertebral disc heights are preserved. Eccentric left paracentral/foraminal disc bulge at L3-4 noted. Facet hypertrophy results in mild left and moderate right neuroforaminal narrowing at this level. Streak artifact limits evaluation of the neural foramina and spinal canal at L4-5. Bilateral laminectomy and posterior decompression of L5 has been performed. No significant neuroforaminal narrowing or central canal stenosis at this level. IMPRESSION: 1. No acute fracture or listhesis of the lumbar spine. 2. Stable chronic L1 compression deformity with approximately 50% loss of height. 3. Stable sclerotic foci within the L2 and L5 vertebral bodies suspicious for sclerotic osseous metastases as these are stable since prior examination, but are new since remote prior examination of 03/17/2022. 4. L4-5 anterior posterior lumbar fusion with instrumentation. Degenerative ankylosis of the anterior posterior elements of L5-S1. 5. Advanced degenerative disease at L3-4 resulting in mild left and moderate right neuroforaminal narrowing at this level. Electronically Signed   By: Helyn Numbers M.D.   On: 12/19/2022 20:39   CT ABDOMEN PELVIS W CONTRAST  Result Date: 12/19/2022 CLINICAL DATA:  Abdominal pain EXAM: CT ABDOMEN AND PELVIS WITH CONTRAST TECHNIQUE:  Multidetector CT imaging of the abdomen and pelvis was performed using the standard protocol following bolus administration of intravenous contrast. RADIATION DOSE REDUCTION: This exam was performed according to the departmental dose-optimization program which includes automated exposure control, adjustment of the mA and/or kV according to patient size and/or use of iterative reconstruction technique. CONTRAST:  OMNIPAQUE IOHEXOL 300 MG/ML  SOLN COMPARISON:  10/15/2022 FINDINGS: Lower chest: Trace left pleural effusion. Dependent atelectasis. Coronary artery and aortic atherosclerosis. Hepatobiliary: No focal hepatic abnormality. Gallbladder unremarkable. Pancreas: No focal abnormality or ductal dilatation. Spleen: No focal abnormality.  Normal size. Adrenals/Urinary Tract: No adrenal abnormality. No focal renal abnormality. No stones or hydronephrosis. Urinary bladder is unremarkable. Stomach/Bowel: Stomach, large and small bowel grossly unremarkable. Vascular/Lymphatic: Aortic atherosclerosis. No evidence of aneurysm or adenopathy. Reproductive: Prior hysterectomy. Mixed solid and cystic mass in the right ovary again measuring 3.9 x 3.4 cm, compared to 3.9 x 3.5 cm previously, unchanged. Other: Small amount of free fluid adjacent to the liver, spleen and in the pelvis. This is new since prior study. Musculoskeletal: Sclerotic area in the posterior L2 vertebral body, 7 mm compared to 6 mm previously. 1.5 cm sclerotic posterior L5 lesion, stable. Chronic moderate L1 compression deformity, stable. Sclerotic lesions in the T9 and T10 vertebral body, similar to prior study. IMPRESSION: Stable left ovarian mass in this patient with known ovarian cancer. Essentially stable sclerotic foci in the thoracic and lumbar spine. Stable moderate compression fracture at L1. Small amount of ascites in the abdomen and pelvis, new since prior study. Trace left pleural effusion. Dependent atelectasis in the lower lobes. Aortic  atherosclerosis.  Coronary artery disease. Electronically Signed   By: Charlett Nose M.D.   On: 12/19/2022 20:15   US Pelvis Complete  Result Date: 12/19/2022 CLINICAL DATA:  Right lower quadrant pain history of ovarian cancer EXAM: TRANSABDOMINAL ULTRASOUND OF PELVIS DOPPLER ULTRASOUND OF OVARIES TECHNIQUE: Transabdominal ultrasound examination of the pelvis was performed including evaluation  of the uterus, ovaries, adnexal regions, and pelvic cul-de-sac. Color and duplex Doppler ultrasound was utilized to evaluate blood flow to the ovaries. COMPARISON:  CT 10/15/2022 FINDINGS: Uterus Hysterectomy Endometrium Hysterectomy Right ovary Measurements: 2.5 x 2.2 x 2.7 cm = volume: 8.9 mL. Cystic and soft tissue appearance of the right ovary presumably corresponding to the right adnexal mass seen on multiple prior CT imaging. This measures 3.8 x 3.9 x 4 cm. Left ovary Oophorectomy Pulsed Doppler evaluation demonstrates normal low-resistance arterial and venous waveforms in the right ovary and associated mass Other: Small volume free fluid in the pelvis. IMPRESSION: 1. Status post hysterectomy and left oophorectomy. 2. 4 cm cystic and soft tissue appearance of the right ovary presumably corresponding to the given history and right adnexal mass seen on multiple prior CT imaging. No convincing evidence for torsion on this exam. 3. Small volume free fluid in the pelvis. Electronically Signed   By: Jasmine Pang M.D.   On: 12/19/2022 19:17   [place] Art/Ven Flow Abd Pelv Doppler  Result Date: 12/19/2022 CLINICAL DATA:  Right lower quadrant pain history of ovarian cancer EXAM: TRANSABDOMINAL ULTRASOUND OF PELVIS DOPPLER ULTRASOUND OF OVARIES TECHNIQUE: Transabdominal ultrasound examination of the pelvis was performed including evaluation of the uterus, ovaries, adnexal regions, and pelvic cul-de-sac. Color and duplex Doppler ultrasound was utilized to evaluate blood flow to the ovaries. COMPARISON:  CT 10/15/2022 FINDINGS:  Uterus Hysterectomy Endometrium Hysterectomy Right ovary Measurements: 2.5 x 2.2 x 2.7 cm = volume: 8.9 mL. Cystic and soft tissue appearance of the right ovary presumably corresponding to the right adnexal mass seen on multiple prior CT imaging. This measures 3.8 x 3.9 x 4 cm. Left ovary Oophorectomy Pulsed Doppler evaluation demonstrates normal low-resistance arterial and venous waveforms in the right ovary and associated mass Other: Small volume free fluid in the pelvis. IMPRESSION: 1. Status post hysterectomy and left oophorectomy. 2. 4 cm cystic and soft tissue appearance of the right ovary presumably corresponding to the given history and right adnexal mass seen on multiple prior CT imaging. No convincing evidence for torsion on this exam. 3. Small volume free fluid in the pelvis. Electronically Signed   By: Jasmine Pang M.D.   On: 12/19/2022 19:17      ED Course: treated with Dilaudid 1 mg IV x 2 without control of pain     Assessment/Plan:  82 y.o. female with hx metastatic ovarian cancer with peritoneal carcinomatosis, mets to lumbar and thoracic spine, degenerative disc disease, prior spinal fusion, CVA, mood disorder, who was brought in from PM+R clinic due to subacute on chronic progressive RLQ pain suspect related to her R sided ovarian mass.   Subacute on chronic progressive RLQ pain, suspect uncontrolled cancer related pain Hx metastatic ovarian cancer with R sided ovarian mass, peritoneal carcinomatosis, mets to T/L spine.  Few weeks progressive RLQ pain. Chemistries unrevealing. CT A/P / L spine with stable R ovarian mass, T/L spine mets and stable prior compression fracture. Note of new small ascites in the abd and pelvis. On my review does have significant stool burden as well throughout the colon. Pelvic US without evidence of torsion. Suspect pain related to R ovarian mass and progressive cancer related pain.  - Pain ctrl: Tylenol 1 g q 6 hr scheduled, oxycodone 10 mg / 15 mg q 6  hr for mod / severe, Dilaudid 0.5 mg IV 1 4 hr for breakthrough pain. Continue home Fentanyl patch 25 mcg/hr, and change adjuncts per PMR rec to Lyrica  100 mg TID and decrease gabapentin to 300 mg QID  - If palliative available over weekend, routine consult in AM for cancer related pain management. Does have initial outpatient appt on 10/31  - At discharge she will likely need a short term Rx for additional oxycodone tablets (for instance 5 mg PO q 6 hr prn for severe pain to take in addition to home percocet 10-325 mg in order to bridge her to palliative appt on 10/31)   Acute on chronic anemia, macrocytic  Baseline Hb around 11, down to 9.2 on admission. Reports recent hx of dark stools, question possible occult UGIB or slow transit LGIB with her OIC.  - Send FOBT  - Check iron panel, B12, folate   Deconditioning  Due to her incident pain with moving, at risk of physical deconditioning. Typically ambulatory with use of a walker  - PT evaluation   Chronic medical problems:  Hx stroke: continue home statin  Mood d/o: continue home duloxetine  Chronic pain: see above   There is no height or weight on file to calculate BMI.    DVT prophylaxis:  SCDs Code Status:  DNR/DNI(Do NOT Intubate); Confirmed with the patient  Diet:  Diet Orders (From admission, onward)     Start     Ordered   12/19/22 2228  Diet regular Room service appropriate? Yes; Fluid consistency: Thin  Diet effective now       Question Answer Comment  Room service appropriate? Yes   Fluid consistency: Thin      12/19/22 2231           Family Communication:  No   Consults:  None   Admission status:   Observation, Med-Surg  Severity of Illness: The appropriate patient status for this patient is OBSERVATION. Observation status is judged to be reasonable and necessary in order to provide the required intensity of service to ensure the patient's safety. The patient's presenting symptoms, physical exam findings, and  initial radiographic and laboratory data in the context of their medical condition is felt to place them at decreased risk for further clinical deterioration. Furthermore, it is anticipated that the patient will be medically stable for discharge from the hospital within 2 midnights of admission.    Dolly Rias, MD [facility]  How to contact the [facility] Attending or Consulting provider 7A - 7P or covering provider during after hours 7P -7A, for this patient.  Check the care team in [facility] and look for a) attending/consulting [facility] provider listed and b) the [facility] team listed Log into [email or web address] and use [place]'s universal password to access. If you do not have the password, please contact the hospital operator. Locate the [facility] provider you are looking for under [facility] and page to a number that you can be directly reached. If you still have difficulty reaching the provider, please page the [facility] (Director on Call) for the Hospitalists listed on amion for assistance.  12/19/2022, 10:38 PM

## 2022-12-19 NOTE — Telephone Encounter (Signed)
Alexandria Mclaughlin, it looks like she's currently in the ED as I write this. I otherwise would have called her with changes.   Next week, you can increase lyrica to 100mg  TID and decrease gabapentin to 300mg  qid. We'll adjust further from there.   Thx

## 2022-12-19 NOTE — Telephone Encounter (Signed)
Spoke with patient confirming upcoming appointment change

## 2022-12-19 NOTE — Telephone Encounter (Signed)
Doctor Riley Kill,  Alexandria Mclaughlin came to office today complaining of excruciating  RLQ Abdominal Pain for the last two weeks, she didn't seek medical attention.  She will be evaluated at [facility].  I reviewed your note from 11/19/2022.  She has noticed improvement of her Lumbar Radicular pain with the Pregabalin + Gabapentin, let me know if you want me to start the slow weaning of Gabapentin. Will review Hospital note, on Monday 12/22/2022.

## 2022-12-19 NOTE — ED Provider Notes (Signed)
[place] EMERGENCY DEPARTMENT AT [facility] Provider Note   CSN: [id number] Arrival date & time: 12/19/22  1607     History  No chief complaint on file.   Alexandria Mclaughlin is a 82 y.o. female.  Patient with history of right sided ovarian cancer, peritoneal carcinomatosis currently on chemotherapy presents today with complaints of abdominal pain. She states that same has been worsening for the past 3 weeks and specifically for the past 3 days. Uncontrolled with home narcotics. Denies nausea, vomiting, or diarrhea. Does also endorse back pain, has known metastasis to the thoracic, lumbar spine. Denies loss of bowel or bladder function or saddle paresthesias. No fevers or chills. Last chemo infusion was 10/03, next scheduled infusion is 10/31. Did not reach out to her oncologist about her pain.  The history is provided by the patient. No language interpreter was used.       Home Medications Prior to Admission medications   Medication Sig Start Date End Date Taking? Authorizing Provider  aspirin-acetaminophen-caffeine (EXCEDRIN MIGRAINE) [phone] MG tablet Take 1 tablet by mouth every 6 (six) hours as needed for headache or migraine.    [provider]  Cholecalciferol (VITAMIN D) 50 MCG (2000 UT) CAPS Take 2,000 Units by mouth daily.    [provider]  clonazePAM (KLONOPIN) 0.5 MG tablet Take 1 tablet (0.5 mg total) by mouth at bedtime as needed for anxiety. 11/21/22   Raulkar, Drema Pry, MD  cyanocobalamin 1000 MCG tablet 1,000 mcg daily.    [provider]  dexamethasone (DECADRON) 4 MG tablet Take 2 tabs at the night before and 2 tab the morning of chemotherapy, every 3 weeks, by mouth x 6 cycles 04/22/22   Artis Delay, MD  DULoxetine (CYMBALTA) 60 MG capsule TAKE 1 CAPSULE BY MOUTH EVERY DAY 08/12/22   Ranelle Oyster, MD  fentaNYL (DURAGESIC) 25 MCG/HR Place 1 patch onto the skin every 3 (three) days. 10/15/22 10/15/23  Jones Bales, NP   gabapentin (NEURONTIN) 600 MG tablet TAKE 1 TABLET BY MOUTH FOUR TIMES A DAY 10/07/22   Jones Bales, NP  irbesartan (AVAPRO) 150 MG tablet Take 150 mg by mouth daily.    [provider]  lactulose (CHRONULAC) 10 GM/15ML solution TAKE 15 MLS (10 G TOTAL) BY MOUTH 3 (THREE) TIMES DAILY. 08/20/22   Artis Delay, MD  lidocaine-prilocaine (EMLA) cream APPLY TO AFFECTED AREA ONCE AS DIRECTED 12/15/22   Artis Delay, MD  magnesium oxide (MAG-OX) 400 (240 Mg) MG tablet TAKE 1 TABLET BY MOUTH EVERY DAY 12/08/22   Artis Delay, MD  Melatonin 10 MG TABS Take 10 mg by mouth at bedtime as needed (sleep).    [provider]  methimazole (TAPAZOLE) 5 MG tablet Take 5 mg by mouth. 3 times a week only    [provider]  ondansetron (ZOFRAN) 8 MG tablet Take 1 tablet (8 mg total) by mouth every 8 (eight) hours as needed for nausea or vomiting. Start on the third day after carboplatin. 04/22/22   Artis Delay, MD  oxyCODONE-acetaminophen (PERCOCET) 10-325 MG tablet Take 1 tablet by mouth 5 (five) times daily as needed for pain. 10/15/22 10/15/23  Jones Bales, NP  polyethylene glycol (MIRALAX / GLYCOLAX) 17 g packet Take 17 g by mouth 2 (two) times daily.    [provider]  pregabalin (LYRICA) 50 MG capsule Take 1 capsule (50 mg total) by mouth 3 (three) times daily. 11/19/22   Ranelle Oyster, MD  prochlorperazine (COMPAZINE) 10 MG tablet TAKE 1 TABLET BY MOUTH EVERY 6 HOURS AS NEEDED FOR NAUSEA OR VOMITING. 11/03/22   Artis Delay, MD  rosuvastatin (CRESTOR) 20 MG tablet Take 20 mg by mouth daily. 10/20/22   [provider]  rosuvastatin (CRESTOR) 40 MG tablet Take 1 tablet (40 mg total) by mouth at bedtime. Patient not taking: Reported on 11/19/2022 05/05/22   Parke Poisson, MD  senna (SENOKOT) 8.6 MG tablet Take 2 tablets by mouth 2 (two) times daily.    [provider]  traZODone (DESYREL) 100 MG tablet TAKE 1 TABLET BY MOUTH EVERYDAY AT BEDTIME 09/15/22    Jones Bales, NP      Allergies    Patient has no known allergies.    Review of Systems   Review of Systems  Gastrointestinal:  Positive for abdominal pain.  All other systems reviewed and are negative.   Physical Exam Updated Vital Signs BP 138/82   Pulse 71   Temp 98 F (36.7 C) (Oral)   Resp 17   SpO2 93%  Physical Exam Vitals and nursing note reviewed.  Constitutional:      General: She is not in acute distress.    Appearance: Normal appearance. She is normal weight. She is not ill-appearing, toxic-appearing or diaphoretic.  HENT:     Head: Normocephalic and atraumatic.  Cardiovascular:     Rate and Rhythm: Normal rate.  Pulmonary:     Effort: Pulmonary effort is normal. No respiratory distress.  Abdominal:     Tenderness: There is abdominal tenderness in the right lower quadrant.  Musculoskeletal:        General: Normal range of motion.     Cervical back: Normal range of motion.     Comments: TTP thoracic and lumbar spine. No stepoffs, lesions, deformity, or overlying skin changes  Skin:    General: Skin is warm and dry.  Neurological:     General: No focal deficit present.     Mental Status: She is alert.  Psychiatric:        Mood and Affect: Mood normal.        Behavior: Behavior normal.     ED Results / Procedures / Treatments   Labs (all labs ordered are listed, but only abnormal results are displayed) Labs Reviewed  CBC WITH DIFFERENTIAL/PLATELET - Abnormal; Notable for the following components:      Result Value   WBC 3.2 (*)    RBC 2.91 (*)    Hemoglobin 9.2 (*)    HCT 29.4 (*)    MCV 101.0 (*)    All other components within normal limits  COMPREHENSIVE METABOLIC PANEL  LIPASE, BLOOD  URINALYSIS, ROUTINE W REFLEX MICROSCOPIC    EKG None  Radiology CT L-SPINE NO CHARGE  Result Date: 12/19/2022 CLINICAL DATA:  Back pain, lower extremity radicular pain EXAM: CT LUMBAR SPINE WITHOUT CONTRAST TECHNIQUE: Multidetector CT imaging of the  lumbar spine was performed without intravenous contrast administration. Multiplanar CT image reconstructions were also generated. RADIATION DOSE REDUCTION: This exam was performed according to the departmental dose-optimization program which includes automated exposure control, adjustment of the mA and/or kV according to patient size and/or use of iterative reconstruction technique. COMPARISON:  None Available. FINDINGS: Segmentation: 5 lumbar type vertebrae. Alignment: Normal. Vertebrae: L4-5 anterior posterior lumbar fusion with instrumentation again noted. Degenerative ankylosis of the anterior posterior elements of L5-S1 noted. Chronic L1 compression deformity with approximately 50% loss of height is unchanged. Sclerotic foci within the  L2 and L5 vertebral bodies is again identified suspicious for sclerotic osseous metastases as these are stable since prior examination, but are new since remote prior examination of 03/17/2022. No acute fracture. Paraspinal and other soft tissues: Negative paraspinal soft tissues. Mass within the right hemipelvis is better assessed on accompanying CT examination of the abdomen and pelvis. Disc levels: Intervertebral disc space narrowing and endplate remodeling at L3-4 in keeping with changes of advanced degenerative disc disease. Remaining intervertebral disc heights are preserved. Eccentric left paracentral/foraminal disc bulge at L3-4 noted. Facet hypertrophy results in mild left and moderate right neuroforaminal narrowing at this level. Streak artifact limits evaluation of the neural foramina and spinal canal at L4-5. Bilateral laminectomy and posterior decompression of L5 has been performed. No significant neuroforaminal narrowing or central canal stenosis at this level. IMPRESSION: 1. No acute fracture or listhesis of the lumbar spine. 2. Stable chronic L1 compression deformity with approximately 50% loss of height. 3. Stable sclerotic foci within the L2 and L5 vertebral  bodies suspicious for sclerotic osseous metastases as these are stable since prior examination, but are new since remote prior examination of 03/17/2022. 4. L4-5 anterior posterior lumbar fusion with instrumentation. Degenerative ankylosis of the anterior posterior elements of L5-S1. 5. Advanced degenerative disease at L3-4 resulting in mild left and moderate right neuroforaminal narrowing at this level. Electronically Signed   By: Helyn Numbers M.D.   On: 12/19/2022 20:39   CT ABDOMEN PELVIS W CONTRAST  Result Date: 12/19/2022 CLINICAL DATA:  Abdominal pain EXAM: CT ABDOMEN AND PELVIS WITH CONTRAST TECHNIQUE: Multidetector CT imaging of the abdomen and pelvis was performed using the standard protocol following bolus administration of intravenous contrast. RADIATION DOSE REDUCTION: This exam was performed according to the departmental dose-optimization program which includes automated exposure control, adjustment of the mA and/or kV according to patient size and/or use of iterative reconstruction technique. CONTRAST:  OMNIPAQUE IOHEXOL 300 MG/ML  SOLN COMPARISON:  10/15/2022 FINDINGS: Lower chest: Trace left pleural effusion. Dependent atelectasis. Coronary artery and aortic atherosclerosis. Hepatobiliary: No focal hepatic abnormality. Gallbladder unremarkable. Pancreas: No focal abnormality or ductal dilatation. Spleen: No focal abnormality.  Normal size. Adrenals/Urinary Tract: No adrenal abnormality. No focal renal abnormality. No stones or hydronephrosis. Urinary bladder is unremarkable. Stomach/Bowel: Stomach, large and small bowel grossly unremarkable. Vascular/Lymphatic: Aortic atherosclerosis. No evidence of aneurysm or adenopathy. Reproductive: Prior hysterectomy. Mixed solid and cystic mass in the right ovary again measuring 3.9 x 3.4 cm, compared to 3.9 x 3.5 cm previously, unchanged. Other: Small amount of free fluid adjacent to the liver, spleen and in the pelvis. This is new since prior  study. Musculoskeletal: Sclerotic area in the posterior L2 vertebral body, 7 mm compared to 6 mm previously. 1.5 cm sclerotic posterior L5 lesion, stable. Chronic moderate L1 compression deformity, stable. Sclerotic lesions in the T9 and T10 vertebral body, similar to prior study. IMPRESSION: Stable left ovarian mass in this patient with known ovarian cancer. Essentially stable sclerotic foci in the thoracic and lumbar spine. Stable moderate compression fracture at L1. Small amount of ascites in the abdomen and pelvis, new since prior study. Trace left pleural effusion. Dependent atelectasis in the lower lobes. Aortic atherosclerosis.  Coronary artery disease. Electronically Signed   By: Charlett Nose M.D.   On: 12/19/2022 20:15   US Pelvis Complete  Result Date: 12/19/2022 CLINICAL DATA:  Right lower quadrant pain history of ovarian cancer EXAM: TRANSABDOMINAL ULTRASOUND OF PELVIS DOPPLER ULTRASOUND OF OVARIES TECHNIQUE: Transabdominal ultrasound examination of the  pelvis was performed including evaluation of the uterus, ovaries, adnexal regions, and pelvic cul-de-sac. Color and duplex Doppler ultrasound was utilized to evaluate blood flow to the ovaries. COMPARISON:  CT 10/15/2022 FINDINGS: Uterus Hysterectomy Endometrium Hysterectomy Right ovary Measurements: 2.5 x 2.2 x 2.7 cm = volume: 8.9 mL. Cystic and soft tissue appearance of the right ovary presumably corresponding to the right adnexal mass seen on multiple prior CT imaging. This measures 3.8 x 3.9 x 4 cm. Left ovary Oophorectomy Pulsed Doppler evaluation demonstrates normal low-resistance arterial and venous waveforms in the right ovary and associated mass Other: Small volume free fluid in the pelvis. IMPRESSION: 1. Status post hysterectomy and left oophorectomy. 2. 4 cm cystic and soft tissue appearance of the right ovary presumably corresponding to the given history and right adnexal mass seen on multiple prior CT imaging. No convincing evidence for  torsion on this exam. 3. Small volume free fluid in the pelvis. Electronically Signed   By: Jasmine Pang M.D.   On: 12/19/2022 19:17   [place] Art/Ven Flow Abd Pelv Doppler  Result Date: 12/19/2022 CLINICAL DATA:  Right lower quadrant pain history of ovarian cancer EXAM: TRANSABDOMINAL ULTRASOUND OF PELVIS DOPPLER ULTRASOUND OF OVARIES TECHNIQUE: Transabdominal ultrasound examination of the pelvis was performed including evaluation of the uterus, ovaries, adnexal regions, and pelvic cul-de-sac. Color and duplex Doppler ultrasound was utilized to evaluate blood flow to the ovaries. COMPARISON:  CT 10/15/2022 FINDINGS: Uterus Hysterectomy Endometrium Hysterectomy Right ovary Measurements: 2.5 x 2.2 x 2.7 cm = volume: 8.9 mL. Cystic and soft tissue appearance of the right ovary presumably corresponding to the right adnexal mass seen on multiple prior CT imaging. This measures 3.8 x 3.9 x 4 cm. Left ovary Oophorectomy Pulsed Doppler evaluation demonstrates normal low-resistance arterial and venous waveforms in the right ovary and associated mass Other: Small volume free fluid in the pelvis. IMPRESSION: 1. Status post hysterectomy and left oophorectomy. 2. 4 cm cystic and soft tissue appearance of the right ovary presumably corresponding to the given history and right adnexal mass seen on multiple prior CT imaging. No convincing evidence for torsion on this exam. 3. Small volume free fluid in the pelvis. Electronically Signed   By: Jasmine Pang M.D.   On: 12/19/2022 19:17    Procedures Procedures    Medications Ordered in ED Medications  HYDROmorphone (DILAUDID) injection 1 mg (has no administration in time range)  HYDROmorphone (DILAUDID) injection 1 mg (1 mg Intravenous Given 12/19/22 1707)  iohexol (OMNIPAQUE) 300 MG/ML solution 100 mL (100 mLs Intravenous Contrast Given 12/19/22 1816)  HYDROmorphone (DILAUDID) injection 1 mg (1 mg Intravenous Given 12/19/22 2003)    ED Course/ Medical Decision Making/  A&P                                 Medical Decision Making Amount and/or Complexity of Data Reviewed Labs: ordered. Radiology: ordered.  Risk Prescription drug management. Decision regarding hospitalization.   This patient is a 82 y.o. female who presents to the ED for concern of abdominal pain, this involves an extensive number of treatment options, and is a complaint that carries with it a high risk of complications and morbidity. The emergent differential diagnosis prior to evaluation includes, but is not limited to,  The differential diagnosis for generalized abdominal pain includes, but is not limited to AAA, gastroenteritis, appendicitis, Bowel obstruction, Bowel perforation. Gastroparesis, DKA, Hernia, Inflammatory bowel disease, mesenteric ischemia, pancreatitis, peritonitis  SBP, volvulus, ovarian torsion, worsening pain from malignancy   This is not an exhaustive differential.   Past Medical History / Co-morbidities / Social History:  has a past medical history of Abnormality of gait (02/07/2015), Acquired hallux rigidus of left foot (12/17/2020), Acquired unequal leg length on left (08/08/2011), AMS (altered mental status) (12/28/2020), Biceps tendonitis on left (08/18/2018), BPPV (benign paroxysmal positional vertigo) (02/07/2015), Bunion (12/17/2020), Cancer (HCC) (04/25/2022), Cerebrovascular disease, Cervical facet syndrome, Coronary artery disease (04/27/2022), Disorders of sacrum, Enthesopathy of hip region, Essential hypertension (02/07/2015), Generalized anxiety disorder, GERD (gastroesophageal reflux disease), Headache, History of kidney stones, HLD (hyperlipidemia) (02/07/2015), Low back pain (02/07/2015), Lumbar facet arthropathy (07/05/2014), Lumbar post-laminectomy syndrome (06/11/2011), Lumbar radicular pain (07/29/2016), Lumbar radiculopathy (04/26/2018), Major depressive disorder, Mild cognitive impairment of uncertain or unknown etiology (01/24/2022), Multiple lacunar  infarcts, Myoclonus (09/19/2015), Rotator cuff tendonitis, left (06/08/2018), Sacroiliac joint dysfunction (06/11/2011), Sciatic neuritis, Stroke [facility]), Subcortical infarction, Synovitis and tenosynovitis, Therapeutic opioid induced constipation (07/25/2015), Thyroid disease, Transient left leg weakness, and Vision abnormalities.  Additional history: Chart reviewed. Pertinent results include: patient with right ovarian cancer with peritoneal carcinomatosis with thoracic and lumbar spine metastasis, followed by oncology Dr. Bertis Ruddy  Physical Exam: Physical exam performed. The pertinent findings include: diffuse abdominal tenderness to palpation  Lab Tests: I ordered, and personally interpreted labs.  The pertinent results include:  WBC 3.2, likely due to chemotherapy. Hgb 9.2. No other acute laboratory abnormalities   Imaging Studies: I ordered imaging studies including Pelvic US, CT abdomen pelvis with tspine and lspine add ons . I independently visualized and interpreted imaging which showed   Pelvic US: 1. Status post hysterectomy and left oophorectomy. 2. 4 cm cystic and soft tissue appearance of the right ovary presumably corresponding to the given history and right adnexal mass seen on multiple prior CT imaging. No convincing evidence for torsion on this exam. 3. Small volume free fluid in the pelvis.  Ct  Stable left ovarian mass in this patient with known ovarian cancer.   Essentially stable sclerotic foci in the thoracic and lumbar spine. Stable moderate compression fracture at L1.   Small amount of ascites in the abdomen and pelvis, new since prior study.   Trace left pleural effusion. Dependent atelectasis in the lower lobes.   Aortic atherosclerosis.  Coronary artery disease.  I agree with the radiologist interpretation.   Medications: I ordered medication including dilaudid  for pain. Reevaluation of the patient after these medicines showed that the patient  improved. I have reviewed the patients home medicines and have made adjustments as needed.   Disposition: After consideration of the diagnostic results and the patients response to treatment, I feel that patient will need admission for intractable abdominal pain, likely due to patients malignancy. Work-up otherwise benign. Discussed same with patient who is requesting admission. Pain not controlled with home meds. Requiring multiple doses of Dilaudid.   Discussed patient with hospitalist Dr. Lazarus Salines who accepts patient for admission.   I discussed this case with my attending physician Dr. Maple Hudson who cosigned this note including patient's presenting symptoms, physical exam, and planned diagnostics and interventions. Attending physician stated agreement with plan or made changes to plan which were implemented.    Final Clinical Impression(s) / ED Diagnoses Final diagnoses:  Intractable abdominal pain    Rx / DC Orders ED Discharge Orders     None         Silva Bandy, PA-C 12/19/22 2210    Coral Spikes, DO 12/19/22 2313

## 2022-12-19 NOTE — Progress Notes (Signed)
Subjective:    Patient ID: Alexandria Mclaughlin, female    DOB: 31-Mar-1940, 82 y.o.   MRN: [id number]  HPI: Alexandria Mclaughlin is a 82 y.o. female who returns for follow up appointment for chronic pain and medication refill. She states her pain is located in her RLQ abdomen for last two weeks, she didn't seek medical attention. She denies nausea, vomiting or diarrhea. Also reports she is eating.  Also reports lower back pain.  She rates her  pain 9. Her current exercise regime is walking with her walker short distances .  Alexandria Mclaughlin Morphine equivalent is 135.00 MME.  She  is also prescribed Clonazepam  .We have discussed the black box warning of using opioids and benzodiazepines. I highlighted the dangers of using these drugs together and discussed the adverse events including respiratory suppression, overdose, cognitive impairment and importance of compliance with current regimen. We will continue to monitor and adjust as indicated.     Oral Swab was Performed today.     Pain Inventory Average Pain 9 Pain Right Now 9 My pain is constant, sharp, burning, and stabbing  In the last 24 hours, has pain interfered with the following? General activity 7 Relation with others 8 Enjoyment of life 9 What TIME of day is your pain at its worst? daytime Sleep (in general) Good  Pain is worse with: walking, bending, sitting, and standing Pain improves with: rest, heat/ice, and medication Relief from Meds: 6  Family History  Problem Relation Age of Onset   Diabetes Mother    Heart disease Mother    Stroke Mother    Heart disease Father    Heart disease Brother    Colon cancer Neg Hx    Breast cancer Neg Hx    Ovarian cancer Neg Hx    Endometrial cancer Neg Hx    Pancreatic cancer Neg Hx    Prostate cancer Neg Hx    Social History   Socioeconomic History   Marital status: Widowed    Spouse name: Not on file   Number of children: Not on file   Years of education: 14   Highest education level:  Associate degree: academic program  Occupational History   Occupation: Retired  Tobacco Use   Smoking status: Former    Current packs/day: 0.00    Average packs/day: 0.3 packs/day for 15.0 years (3.8 ttl pk-yrs)    Types: Cigarettes    Start date: 03/15/1953    Quit date: 03/15/1968    Years since quitting: 54.8   Smokeless tobacco: Never  Vaping Use   Vaping status: Never Used  Substance and Sexual Activity   Alcohol use: Not Currently    Comment: occasional glass of wine   Drug use: Yes    Frequency: 20.0 times per week    Types: Fentanyl, Hydrocodone   Sexual activity: Not Currently  Other Topics Concern   Not on file  Social History Narrative   Right handed   Drinks caffeine   Condo two story with Engineer, structural   Social Determinants of Health   Financial Resource Strain: Not on file  Food Insecurity: Not on file  Transportation Needs: Not on file  Physical Activity: Not on file  Stress: Not on file  Social Connections: Unknown (07/04/2021)   [facility]   Social Network    Social Network: Not on file   Past Surgical History:  Procedure Laterality Date   ABDOMINAL HYSTERECTOMY     Due  to fibroids   BACK SURGERY  2011   lumbar Fusion   BLADDER SURGERY  1985   CESAREAN SECTION     x2   EYE SURGERY Bilateral    Cataract surgery with IOL   FOOT SURGERY Right    "just scraped the bone"   IR IMAGING GUIDED PORT INSERTION  05/26/2022   LAPAROSCOPY N/A 04/08/2022   Procedure: LAPAROSCOPY DIAGNOSTIC WITH BIOPSIES;  Surgeon: Clide Cliff, MD;  Location: [facility];  Service: Gynecology;  Laterality: N/A;   RETINAL DETACHMENT SURGERY Right 10/2011   TRANSFORAMINAL LUMBAR INTERBODY FUSION W/ MIS 1 LEVEL Right 04/26/2018   Procedure: Right Lumbar four-five Minimally invasive Transforaminal lumbar interbody fusion;  Surgeon: Jadene Pierini, MD;  Location: [facility];  Service: Neurosurgery;  Laterality: Right;   Past Surgical History:  Procedure  Laterality Date   ABDOMINAL HYSTERECTOMY     Due to fibroids   BACK SURGERY  2011   lumbar Fusion   BLADDER SURGERY  1985   CESAREAN SECTION     x2   EYE SURGERY Bilateral    Cataract surgery with IOL   FOOT SURGERY Right    "just scraped the bone"   IR IMAGING GUIDED PORT INSERTION  05/26/2022   LAPAROSCOPY N/A 04/08/2022   Procedure: LAPAROSCOPY DIAGNOSTIC WITH BIOPSIES;  Surgeon: Clide Cliff, MD;  Location: [facility];  Service: Gynecology;  Laterality: N/A;   RETINAL DETACHMENT SURGERY Right 10/2011   TRANSFORAMINAL LUMBAR INTERBODY FUSION W/ MIS 1 LEVEL Right 04/26/2018   Procedure: Right Lumbar four-five Minimally invasive Transforaminal lumbar interbody fusion;  Surgeon: Jadene Pierini, MD;  Location: [facility] OR;  Service: Neurosurgery;  Laterality: Right;   Past Medical History:  Diagnosis Date   Abnormality of gait 02/07/2015   Acquired hallux rigidus of left foot 12/17/2020   Acquired unequal leg length on left 08/08/2011   AMS (altered mental status) 12/28/2020   Biceps tendonitis on left 08/18/2018   BPPV (benign paroxysmal positional vertigo) 02/07/2015   Bunion 12/17/2020   Cancer (HCC) 04/25/2022   Cerebrovascular disease    Cervical facet syndrome    Coronary artery disease 04/27/2022   Disorders of sacrum    Enthesopathy of hip region    Essential hypertension 02/07/2015   Generalized anxiety disorder    GERD (gastroesophageal reflux disease)    Headache    History of kidney stones    HLD (hyperlipidemia) 02/07/2015   Low back pain 02/07/2015   Lumbar facet arthropathy 07/05/2014   Lumbar post-laminectomy syndrome 06/11/2011   Lumbar radicular pain 07/29/2016   Lumbar radiculopathy 04/26/2018   Major depressive disorder    Mild cognitive impairment of uncertain or unknown etiology 01/24/2022   Multiple lacunar infarcts    MRI - bilateral basal ganglia and left thalamus   Myoclonus 09/19/2015   Rotator cuff tendonitis, left 06/08/2018   Sacroiliac  joint dysfunction 06/11/2011   Sciatic neuritis    Stroke (HCC)    hx TIA   Subcortical infarction    2016 MRI - small chronic subcortical infarct with associated chronic hemosiderin deposition within the right superior frontal gyrus.   Synovitis and tenosynovitis    Therapeutic opioid induced constipation 07/25/2015   Thyroid disease    Transient left leg weakness    Vision abnormalities    There were no vitals taken for this visit.  Opioid Risk Score:   Fall Risk Score:  `1  Depression screen [facility] 2/9     11/19/2022    2:46 PM 10/15/2022  1:24 PM 08/21/2022    3:01 PM 04/24/2022    1:11 PM 02/25/2022    1:02 PM 08/28/2021    2:31 PM 06/19/2021    2:53 PM  Depression screen PHQ 2/9  Decreased Interest 1 1 1 1  0 0 1  Down, Depressed, Hopeless 1 1 1 1  0 0 1  PHQ - 2 Score 2 2 2 2  0 0 2     Review of Systems  Genitourinary:  Positive for pelvic pain.  Musculoskeletal:  Positive for gait problem.  All other systems reviewed and are negative.     Objective:   Physical Exam Vitals and nursing note reviewed.  Constitutional:      Appearance: Normal appearance.  Cardiovascular:     Rate and Rhythm: Normal rate and regular rhythm.     Pulses: Normal pulses.     Heart sounds: Normal heart sounds.  Abdominal:     Palpations: Abdomen is soft.     Tenderness: There is abdominal tenderness. There is guarding and rebound.  Musculoskeletal:     Cervical back: Normal range of motion and neck supple.     Comments: Normal Muscle Bulk and Muscle Testing Reveals:  Upper Extremities: Decreased ROM  90 Degrees and Muscle Strength 5/5  Lumbar Paraspinal Tenderness: L-3-L-5 Lower Extremities: Decreased ROM and Muscle Strength 5/5 Arises from Table slowly using walker for support Transfer to EMS Jonni Sanger       Skin:    General: Skin is warm and dry.  Neurological:     Mental Status: She is alert and oriented to person, place, and time.  Psychiatric:        Mood and Affect: Mood normal.         Behavior: Behavior normal.         Assessment & Plan:  Abdominal RLQ: + Tenderness, + Guarding and + Rebound Pain: She denies N/V and Diarrhea. Also states  she is eating. EMS Called. Alexandria Mclaughlin will be evaluated at [facility], per patient request.  . Facet Arthropathy/ Sacroiliac Joint Dysfunction: 12/19/2022. Continue : Fentanyl 25 mcg patch  # 10- one patch every 3 days and   Percocet 10/325 mg one tablet 5 times a day as needed for pain #130  Continue to Monitor. .Will call Alexandria Mclaughlin on Monday 12/22/2022, she verbalizes understanding.  We will continue the opioid monitoring program, this consists of regular clinic visits, examinations, urine drug screen, pill counts as well as use of [place] Controlled Substance Reporting system. A 12 month History has been reviewed on the [place] Controlled Substance Reporting System on 12/19/2022. 3.  Lumbar Radicular  Pain: Alexandria Mclaughlin was seen by Dr Riley Kill on 11/19/2022, see note for details.  she was Prescribed Pregabalin in addition with her Gabapentin, will discuss with Dr Riley Kill her gabapentin wean, after her hospital evaluation, she verbalizes understanding.  Continue to Monitor.12/19/2022 4. Gait Disorder: Continue HEP / Neurology Following. 10/ 25/2024 5.. Myoclonus with sleep: Continue Klonopin. Continue current medication regimen. Continue to Monitor. 12/19/2022 6.. Memory Changes:  Neurology:Following: No complaints today.  Continue to Monitor. 12/19/2022 7.  Right Shoulder Tendinitis: No complaints today.  Continue to Monitor. 12/19/2022 8. . Malignant Neoplasm of Ovary : Oncology Following. Continue to Monitor. 12/19/2022   F/U in 1 month: She has a scheduled appointment with Dr Riley Kill

## 2022-12-19 NOTE — ED Notes (Signed)
..ED TO INPATIENT HANDOFF REPORT  Name/Age/Gender Alexandria Mclaughlin 82 y.o. female  Code Status    Code Status Orders  (From admission, onward)           Start     Ordered   12/19/22 2227  Do not attempt resuscitation (DNR)- Limited -Do Not Intubate (DNI)  Continuous       Question Answer Comment  If pulseless and not breathing No CPR or chest compressions.   In Pre-Arrest Conditions (Patient Is Breathing and Has A Pulse) Do not intubate. Provide all appropriate non-invasive medical interventions. Avoid ICU transfer unless indicated or required.   Consent: Discussion documented in EHR or advanced directives reviewed      12/19/22 2231           Code Status History     Date Active Date Inactive Code Status Order ID Comments User Context   05/26/2022 1454 05/27/2022 0504 Full Code [id number]  Oley Balm, MD HOV   04/08/2022 0529 04/08/2022 1651 Full Code [id number]  Doylene Bode, NP Inpatient   12/28/2020 1357 01/01/2021 2129 Full Code [id number]  Emeline General, MD ED   04/26/2018 1758 04/27/2018 1908 Full Code [id number]  Jadene Pierini, MD Inpatient       Home/SNF/Other Home  Chief Complaint Ovarian cancer [facility]) [C56.9]  Level of Care/Admitting Diagnosis ED Disposition     ED Disposition  Admit   Condition  --   Comment  Hospital Area: [facility] [[id number]]  Level of Care: Med-Surg [16]  May place patient in observation at [facility] or [facility] if equivalent level of care is available:: No  Covid Evaluation: Asymptomatic - no recent exposure (last 10 days) testing not required  Diagnosis: Ovarian cancer [facility]) [[id number]]  Admitting Physician: Dolly Rias [[id number]]  Attending Physician: Dolly Rias [[id number]]          Medical History Past Medical History:  Diagnosis Date   Abnormality of gait 02/07/2015   Acquired hallux rigidus of left foot 12/17/2020   Acquired unequal leg length on left 08/08/2011   AMS (altered mental  status) 12/28/2020   Biceps tendonitis on left 08/18/2018   BPPV (benign paroxysmal positional vertigo) 02/07/2015   Bunion 12/17/2020   Cancer (HCC) 04/25/2022   Cerebrovascular disease    Cervical facet syndrome    Coronary artery disease 04/27/2022   Disorders of sacrum    Enthesopathy of hip region    Essential hypertension 02/07/2015   Generalized anxiety disorder    GERD (gastroesophageal reflux disease)    Headache    History of kidney stones    HLD (hyperlipidemia) 02/07/2015   Low back pain 02/07/2015   Lumbar facet arthropathy 07/05/2014   Lumbar post-laminectomy syndrome 06/11/2011   Lumbar radicular pain 07/29/2016   Lumbar radiculopathy 04/26/2018   Major depressive disorder    Mild cognitive impairment of uncertain or unknown etiology 01/24/2022   Multiple lacunar infarcts    MRI - bilateral basal ganglia and left thalamus   Myoclonus 09/19/2015   Rotator cuff tendonitis, left 06/08/2018   Sacroiliac joint dysfunction 06/11/2011   Sciatic neuritis    Stroke (HCC)    hx TIA   Subcortical infarction    2016 MRI - small chronic subcortical infarct with associated chronic hemosiderin deposition within the right superior frontal gyrus.   Synovitis and tenosynovitis    Therapeutic opioid induced constipation 07/25/2015   Thyroid disease    Transient left leg weakness    Vision abnormalities  Allergies No Known Allergies  IV Location/Drains/Wounds Patient Lines/Drains/Airways Status     Active Line/Drains/Airways     Name Placement date Placement time Site Days   Implanted Port 05/26/22 Right Chest 05/26/22  1339  Chest  207   Incision - 3 Ports 1: Left;Upper 2: Left;Medial 3: Left;Lower;Medial 04/08/22  --  -- 255            Labs/Imaging Results for orders placed or performed during the hospital encounter of 12/19/22 (from the past 48 hour(s))  Comprehensive metabolic panel     Status: Abnormal   Collection Time: 12/19/22  5:11 PM  Result Value  Ref Range   Sodium 137 135 - 145 mmol/L   Potassium 3.8 3.5 - 5.1 mmol/L   Chloride 101 98 - 111 mmol/L   CO2 27 22 - 32 mmol/L   Glucose, Bld 109 (H) 70 - 99 mg/dL    Comment: Glucose reference range applies only to samples taken after fasting for at least 8 hours.   BUN 12 8 - 23 mg/dL   Creatinine, Ser 1.61 0.44 - 1.00 mg/dL   Calcium 9.2 8.9 - 09.6 mg/dL   Total Protein 6.3 (L) 6.5 - 8.1 g/dL   Albumin 2.9 (L) 3.5 - 5.0 g/dL   AST 13 (L) 15 - 41 U/L   ALT 9 0 - 44 U/L   Alkaline Phosphatase 104 38 - 126 U/L   Total Bilirubin 0.5 0.3 - 1.2 mg/dL   GFR, Estimated >04 >54 mL/min    Comment: (NOTE) Calculated using the CKD-EPI Creatinine Equation (2021)    Anion gap 9 5 - 15    Comment: Performed at [facility], [address], [place], [place] [address]  Lipase, blood     Status: None   Collection Time: 12/19/22  5:11 PM  Result Value Ref Range   Lipase 21 11 - 51 U/L    Comment: Performed at [facility], [address] [place], [place] [address]  CBC with Diff     Status: Abnormal   Collection Time: 12/19/22  5:11 PM  Result Value Ref Range   WBC 3.2 (L) 4.0 - 10.5 K/uL   RBC 2.91 (L) 3.87 - 5.11 MIL/uL   Hemoglobin 9.2 (L) 12.0 - 15.0 g/dL   HCT 29.5 (L) 62.1 - 30.8 %   MCV 101.0 (H) 80.0 - 100.0 fL   MCH 31.6 26.0 - 34.0 pg   MCHC 31.3 30.0 - 36.0 g/dL   RDW 65.7 84.6 - 96.2 %   Platelets 215 150 - 400 K/uL   nRBC 0.0 0.0 - 0.2 %   Neutrophils Relative % 56 %   Neutro Abs 1.8 1.7 - 7.7 K/uL   Lymphocytes Relative 23 %   Lymphs Abs 0.7 0.7 - 4.0 K/uL   Monocytes Relative 20 %   Monocytes Absolute 0.7 0.1 - 1.0 K/uL   Eosinophils Relative 1 %   Eosinophils Absolute 0.0 0.0 - 0.5 K/uL   Basophils Relative 0 %   Basophils Absolute 0.0 0.0 - 0.1 K/uL   Immature Granulocytes 0 %   Abs Immature Granulocytes 0.01 0.00 - 0.07 K/uL    Comment: Performed at [facility], [address] [place], [place] [address]   Urinalysis, Routine w reflex microscopic -Urine, Clean Catch     Status: Abnormal   Collection Time: 12/19/22  5:38 PM  Result Value Ref Range   Color, Urine YELLOW YELLOW   APPearance CLEAR  CLEAR   Specific Gravity, Urine 1.018 1.005 - 1.030   pH 5.0 5.0 - 8.0   Glucose, UA NEGATIVE NEGATIVE mg/dL   Hgb urine dipstick NEGATIVE NEGATIVE   Bilirubin Urine NEGATIVE NEGATIVE   Ketones, ur NEGATIVE NEGATIVE mg/dL   Protein, ur NEGATIVE NEGATIVE mg/dL   Nitrite NEGATIVE NEGATIVE   Leukocytes,Ua MODERATE (A) NEGATIVE   RBC / HPF 0-5 0 - 5 RBC/hpf   WBC, UA 21-50 0 - 5 WBC/hpf   Bacteria, UA RARE (A) NONE SEEN   Squamous Epithelial / HPF 0-5 0 - 5 /HPF   Mucus PRESENT     Comment: Performed at [facility], [address] [place], [place] [address]   CT L-SPINE NO CHARGE  Result Date: 12/19/2022 CLINICAL DATA:  Back pain, lower extremity radicular pain EXAM: CT LUMBAR SPINE WITHOUT CONTRAST TECHNIQUE: Multidetector CT imaging of the lumbar spine was performed without intravenous contrast administration. Multiplanar CT image reconstructions were also generated. RADIATION DOSE REDUCTION: This exam was performed according to the departmental dose-optimization program which includes automated exposure control, adjustment of the mA and/or kV according to patient size and/or use of iterative reconstruction technique. COMPARISON:  None Available. FINDINGS: Segmentation: 5 lumbar type vertebrae. Alignment: Normal. Vertebrae: L4-5 anterior posterior lumbar fusion with instrumentation again noted. Degenerative ankylosis of the anterior posterior elements of L5-S1 noted. Chronic L1 compression deformity with approximately 50% loss of height is unchanged. Sclerotic foci within the L2 and L5 vertebral bodies is again identified suspicious for sclerotic osseous metastases as these are stable since prior examination, but are new since remote prior examination of 03/17/2022. No acute fracture.  Paraspinal and other soft tissues: Negative paraspinal soft tissues. Mass within the right hemipelvis is better assessed on accompanying CT examination of the abdomen and pelvis. Disc levels: Intervertebral disc space narrowing and endplate remodeling at L3-4 in keeping with changes of advanced degenerative disc disease. Remaining intervertebral disc heights are preserved. Eccentric left paracentral/foraminal disc bulge at L3-4 noted. Facet hypertrophy results in mild left and moderate right neuroforaminal narrowing at this level. Streak artifact limits evaluation of the neural foramina and spinal canal at L4-5. Bilateral laminectomy and posterior decompression of L5 has been performed. No significant neuroforaminal narrowing or central canal stenosis at this level. IMPRESSION: 1. No acute fracture or listhesis of the lumbar spine. 2. Stable chronic L1 compression deformity with approximately 50% loss of height. 3. Stable sclerotic foci within the L2 and L5 vertebral bodies suspicious for sclerotic osseous metastases as these are stable since prior examination, but are new since remote prior examination of 03/17/2022. 4. L4-5 anterior posterior lumbar fusion with instrumentation. Degenerative ankylosis of the anterior posterior elements of L5-S1. 5. Advanced degenerative disease at L3-4 resulting in mild left and moderate right neuroforaminal narrowing at this level. Electronically Signed   By: Helyn Numbers M.D.   On: 12/19/2022 20:39   CT ABDOMEN PELVIS W CONTRAST  Result Date: 12/19/2022 CLINICAL DATA:  Abdominal pain EXAM: CT ABDOMEN AND PELVIS WITH CONTRAST TECHNIQUE: Multidetector CT imaging of the abdomen and pelvis was performed using the standard protocol following bolus administration of intravenous contrast. RADIATION DOSE REDUCTION: This exam was performed according to the departmental dose-optimization program which includes automated exposure control, adjustment of the mA and/or kV according to  patient size and/or use of iterative reconstruction technique. CONTRAST:  OMNIPAQUE IOHEXOL 300 MG/ML  SOLN COMPARISON:  10/15/2022 FINDINGS: Lower chest: Trace left pleural effusion. Dependent atelectasis. Coronary artery and aortic atherosclerosis.  Hepatobiliary: No focal hepatic abnormality. Gallbladder unremarkable. Pancreas: No focal abnormality or ductal dilatation. Spleen: No focal abnormality.  Normal size. Adrenals/Urinary Tract: No adrenal abnormality. No focal renal abnormality. No stones or hydronephrosis. Urinary bladder is unremarkable. Stomach/Bowel: Stomach, large and small bowel grossly unremarkable. Vascular/Lymphatic: Aortic atherosclerosis. No evidence of aneurysm or adenopathy. Reproductive: Prior hysterectomy. Mixed solid and cystic mass in the right ovary again measuring 3.9 x 3.4 cm, compared to 3.9 x 3.5 cm previously, unchanged. Other: Small amount of free fluid adjacent to the liver, spleen and in the pelvis. This is new since prior study. Musculoskeletal: Sclerotic area in the posterior L2 vertebral body, 7 mm compared to 6 mm previously. 1.5 cm sclerotic posterior L5 lesion, stable. Chronic moderate L1 compression deformity, stable. Sclerotic lesions in the T9 and T10 vertebral body, similar to prior study. IMPRESSION: Stable left ovarian mass in this patient with known ovarian cancer. Essentially stable sclerotic foci in the thoracic and lumbar spine. Stable moderate compression fracture at L1. Small amount of ascites in the abdomen and pelvis, new since prior study. Trace left pleural effusion. Dependent atelectasis in the lower lobes. Aortic atherosclerosis.  Coronary artery disease. Electronically Signed   By: Charlett Nose M.D.   On: 12/19/2022 20:15   US Pelvis Complete  Result Date: 12/19/2022 CLINICAL DATA:  Right lower quadrant pain history of ovarian cancer EXAM: TRANSABDOMINAL ULTRASOUND OF PELVIS DOPPLER ULTRASOUND OF OVARIES TECHNIQUE: Transabdominal ultrasound  examination of the pelvis was performed including evaluation of the uterus, ovaries, adnexal regions, and pelvic cul-de-sac. Color and duplex Doppler ultrasound was utilized to evaluate blood flow to the ovaries. COMPARISON:  CT 10/15/2022 FINDINGS: Uterus Hysterectomy Endometrium Hysterectomy Right ovary Measurements: 2.5 x 2.2 x 2.7 cm = volume: 8.9 mL. Cystic and soft tissue appearance of the right ovary presumably corresponding to the right adnexal mass seen on multiple prior CT imaging. This measures 3.8 x 3.9 x 4 cm. Left ovary Oophorectomy Pulsed Doppler evaluation demonstrates normal low-resistance arterial and venous waveforms in the right ovary and associated mass Other: Small volume free fluid in the pelvis. IMPRESSION: 1. Status post hysterectomy and left oophorectomy. 2. 4 cm cystic and soft tissue appearance of the right ovary presumably corresponding to the given history and right adnexal mass seen on multiple prior CT imaging. No convincing evidence for torsion on this exam. 3. Small volume free fluid in the pelvis. Electronically Signed   By: Jasmine Pang M.D.   On: 12/19/2022 19:17   [place] Art/Ven Flow Abd Pelv Doppler  Result Date: 12/19/2022 CLINICAL DATA:  Right lower quadrant pain history of ovarian cancer EXAM: TRANSABDOMINAL ULTRASOUND OF PELVIS DOPPLER ULTRASOUND OF OVARIES TECHNIQUE: Transabdominal ultrasound examination of the pelvis was performed including evaluation of the uterus, ovaries, adnexal regions, and pelvic cul-de-sac. Color and duplex Doppler ultrasound was utilized to evaluate blood flow to the ovaries. COMPARISON:  CT 10/15/2022 FINDINGS: Uterus Hysterectomy Endometrium Hysterectomy Right ovary Measurements: 2.5 x 2.2 x 2.7 cm = volume: 8.9 mL. Cystic and soft tissue appearance of the right ovary presumably corresponding to the right adnexal mass seen on multiple prior CT imaging. This measures 3.8 x 3.9 x 4 cm. Left ovary Oophorectomy Pulsed Doppler evaluation  demonstrates normal low-resistance arterial and venous waveforms in the right ovary and associated mass Other: Small volume free fluid in the pelvis. IMPRESSION: 1. Status post hysterectomy and left oophorectomy. 2. 4 cm cystic and soft tissue appearance of the right ovary presumably corresponding to the given history and right  adnexal mass seen on multiple prior CT imaging. No convincing evidence for torsion on this exam. 3. Small volume free fluid in the pelvis. Electronically Signed   By: Jasmine Pang M.D.   On: 12/19/2022 19:17    Pending Labs Unresulted Labs (From admission, onward)     Start     Ordered   12/20/22 0500  Basic metabolic panel  Tomorrow morning,   R        12/19/22 2231   12/20/22 0500  CBC  Tomorrow morning,   R        12/19/22 2231   12/20/22 0500  Magnesium  Tomorrow morning,   R        12/19/22 2231   12/20/22 0500  Phosphorus  Tomorrow morning,   R        12/19/22 2231   12/20/22 0500  Ferritin  Tomorrow morning,   R        12/19/22 2231   12/20/22 0500  Iron and TIBC  Tomorrow morning,   R        12/19/22 2231   12/20/22 0500  Transferrin  Tomorrow morning,   R        12/19/22 2231   12/20/22 0500  Vitamin B12  Tomorrow morning,   R        12/19/22 2231   12/20/22 0500  Folate  Tomorrow morning,   R        12/19/22 2231   12/19/22 2229  Occult blood card to lab, stool RN will collect  Once,   R       Question:  Specimen to be collected by:  Answer:  RN will collect   12/19/22 2231            Vitals/Pain Today's Vitals   12/19/22 2003 12/19/22 2030 12/19/22 2040 12/19/22 2257  BP: (!) 153/76 128/61    Pulse: 78 74    Resp: 18 17    Temp: 98 F (36.7 C)     TempSrc: Oral     SpO2: 96% 94%    PainSc:   6  5     Isolation Precautions No active isolations  Medications Medications  acetaminophen (TYLENOL) tablet 1,000 mg (has no administration in time range)  oxyCODONE (Oxy IR/ROXICODONE) immediate release tablet 10 mg (has no administration  in time range)    Or  oxyCODONE (Oxy IR/ROXICODONE) immediate release tablet 15 mg (has no administration in time range)  HYDROmorphone (DILAUDID) injection 0.5 mg (has no administration in time range)  polyethylene glycol (MIRALAX / GLYCOLAX) packet 17 g (17 g Oral Patient Refused/Not Given 12/19/22 2252)  fentaNYL (DURAGESIC) 25 MCG/HR 1 patch (has no administration in time range)  irbesartan (AVAPRO) tablet 150 mg (has no administration in time range)  DULoxetine (CYMBALTA) DR capsule 60 mg (has no administration in time range)  traZODone (DESYREL) tablet 100 mg (has no administration in time range)  methimazole (TAPAZOLE) tablet 5 mg (has no administration in time range)  ondansetron (ZOFRAN) injection 4 mg (has no administration in time range)  melatonin tablet 10 mg (has no administration in time range)  clonazePAM (KLONOPIN) tablet 0.5 mg (has no administration in time range)  gabapentin (NEURONTIN) capsule 300 mg (has no administration in time range)  pregabalin (LYRICA) capsule 100 mg (has no administration in time range)  senna (SENOKOT) tablet 17.2 mg (has no administration in time range)  bisacodyl (DULCOLAX) suppository 10 mg (has no administration in time range)  HYDROmorphone (  DILAUDID) injection 1 mg (1 mg Intravenous Given 12/19/22 1707)  iohexol (OMNIPAQUE) 300 MG/ML solution 100 mL (100 mLs Intravenous Contrast Given 12/19/22 1816)  HYDROmorphone (DILAUDID) injection 1 mg (1 mg Intravenous Given 12/19/22 2003)  HYDROmorphone (DILAUDID) injection 1 mg (1 mg Intravenous Given 12/19/22 2221)    Mobility walks with device

## 2022-12-20 ENCOUNTER — Other Ambulatory Visit: Payer: Self-pay

## 2022-12-20 ENCOUNTER — Encounter (HOSPITAL_COMMUNITY): Payer: Self-pay | Admitting: Internal Medicine

## 2022-12-20 DIAGNOSIS — K5900 Constipation, unspecified: Secondary | ICD-10-CM

## 2022-12-20 DIAGNOSIS — G893 Neoplasm related pain (acute) (chronic): Secondary | ICD-10-CM | POA: Diagnosis not present

## 2022-12-20 DIAGNOSIS — Z515 Encounter for palliative care: Secondary | ICD-10-CM

## 2022-12-20 DIAGNOSIS — F419 Anxiety disorder, unspecified: Secondary | ICD-10-CM | POA: Diagnosis not present

## 2022-12-20 DIAGNOSIS — R4589 Other symptoms and signs involving emotional state: Secondary | ICD-10-CM | POA: Diagnosis not present

## 2022-12-20 DIAGNOSIS — C561 Malignant neoplasm of right ovary: Secondary | ICD-10-CM | POA: Diagnosis not present

## 2022-12-20 DIAGNOSIS — Z79899 Other long term (current) drug therapy: Secondary | ICD-10-CM

## 2022-12-20 DIAGNOSIS — Z7189 Other specified counseling: Secondary | ICD-10-CM | POA: Diagnosis not present

## 2022-12-20 LAB — BASIC METABOLIC PANEL
Anion gap: 8 (ref 5–15)
BUN: 13 mg/dL (ref 8–23)
CO2: 29 mmol/L (ref 22–32)
Calcium: 8.8 mg/dL — ABNORMAL LOW (ref 8.9–10.3)
Chloride: 101 mmol/L (ref 98–111)
Creatinine, Ser: 0.49 mg/dL (ref 0.44–1.00)
GFR, Estimated: >60 mL/min (ref >60)
Glucose, Bld: 101 mg/dL — ABNORMAL HIGH (ref 70–99)
Potassium: 3.7 mmol/L (ref 3.5–5.1)
Sodium: 138 mmol/L (ref 135–145)

## 2022-12-20 LAB — PHOSPHORUS: Phosphorus: 3.9 mg/dL (ref 2.5–4.6)

## 2022-12-20 LAB — CBC
HCT: 27.9 % — ABNORMAL LOW (ref 36.0–46.0)
Hemoglobin: 8.5 g/dL — ABNORMAL LOW (ref 12.0–15.0)
MCH: 31.5 pg (ref 26.0–34.0)
MCHC: 30.5 g/dL (ref 30.0–36.0)
MCV: 103.3 fL — ABNORMAL HIGH (ref 80.0–100.0)
Platelets: 204 10*3/uL (ref 150–400)
RBC: 2.7 MIL/uL — ABNORMAL LOW (ref 3.87–5.11)
RDW: 14.6 % (ref 11.5–15.5)
WBC: 2.9 10*3/uL — ABNORMAL LOW (ref 4.0–10.5)
nRBC: 0 % (ref 0.0–0.2)

## 2022-12-20 LAB — IRON AND TIBC
Iron: 20 ug/dL — ABNORMAL LOW (ref 28–170)
Saturation Ratios: 9 % — ABNORMAL LOW (ref 10.4–31.8)
TIBC: 216 ug/dL — ABNORMAL LOW (ref 250–450)
UIBC: 196 ug/dL

## 2022-12-20 LAB — TRANSFERRIN: Transferrin: 154 mg/dL — ABNORMAL LOW (ref 192–382)

## 2022-12-20 LAB — FOLATE: Folate: 8.6 ng/mL (ref >5.9)

## 2022-12-20 LAB — VITAMIN B12: Vitamin B-12: 1046 pg/mL — ABNORMAL HIGH (ref 180–914)

## 2022-12-20 LAB — FERRITIN: Ferritin: 61 ng/mL (ref 11–307)

## 2022-12-20 LAB — MAGNESIUM: Magnesium: 1.9 mg/dL (ref 1.7–2.4)

## 2022-12-20 MED ORDER — ALUM & MAG HYDROXIDE-SIMETH 200-200-20 MG/5ML PO SUSP
30.0000 mL | Freq: Three times a day (TID) | ORAL | Status: DC | PRN
  Administered 2022-12-20: 30 mL via ORAL
  Filled 2022-12-20: qty 30

## 2022-12-20 MED ORDER — GABAPENTIN 300 MG PO CAPS
300.0000 mg | ORAL_CAPSULE | Freq: Three times a day (TID) | ORAL | Status: DC
  Administered 2022-12-20 – 2022-12-21 (×3): 300 mg via ORAL
  Filled 2022-12-20 (×3): qty 1

## 2022-12-20 MED ORDER — HYDROMORPHONE HCL 1 MG/ML IJ SOLN
1.0000 mg | INTRAMUSCULAR | Status: DC | PRN
  Administered 2022-12-20 – 2022-12-21 (×6): 1 mg via INTRAVENOUS
  Filled 2022-12-20 (×6): qty 1

## 2022-12-20 MED ORDER — ACETAMINOPHEN 500 MG PO TABS
1000.0000 mg | ORAL_TABLET | Freq: Three times a day (TID) | ORAL | Status: DC
  Administered 2022-12-20 – 2022-12-27 (×19): 1000 mg via ORAL
  Filled 2022-12-20 (×21): qty 2

## 2022-12-20 MED ORDER — CHLORHEXIDINE GLUCONATE CLOTH 2 % EX PADS
6.0000 | MEDICATED_PAD | Freq: Every day | CUTANEOUS | Status: DC
  Administered 2022-12-20 – 2022-12-27 (×7): 6 via TOPICAL

## 2022-12-20 MED ORDER — PANTOPRAZOLE SODIUM 40 MG PO TBEC
40.0000 mg | DELAYED_RELEASE_TABLET | Freq: Every day | ORAL | Status: DC
  Administered 2022-12-20 – 2022-12-27 (×8): 40 mg via ORAL
  Filled 2022-12-20 (×8): qty 1

## 2022-12-20 MED ORDER — ALUM & MAG HYDROXIDE-SIMETH 200-200-20 MG/5ML PO SUSP
30.0000 mL | Freq: Four times a day (QID) | ORAL | Status: DC | PRN
  Administered 2022-12-25: 30 mL via ORAL
  Filled 2022-12-20: qty 30

## 2022-12-20 MED ORDER — FENTANYL 50 MCG/HR TD PT72
1.0000 | MEDICATED_PATCH | TRANSDERMAL | Status: DC
  Administered 2022-12-20: 1 via TRANSDERMAL
  Filled 2022-12-20: qty 1

## 2022-12-20 MED ORDER — OXYCODONE HCL 5 MG PO TABS
15.0000 mg | ORAL_TABLET | ORAL | Status: DC | PRN
  Administered 2022-12-20 – 2022-12-21 (×4): 20 mg via ORAL
  Filled 2022-12-20 (×5): qty 4

## 2022-12-20 NOTE — Progress Notes (Signed)
PROGRESS NOTE  Alexandria Mclaughlin UXN:[id number] DOB: [birth year]   PCP: Artis Delay, MD  Patient is from: Home  DOA: 12/19/2022 LOS: 0  Chief complaints Chief Complaint  Patient presents with   Abdominal Pain     Brief Narrative / Interim history: 82 year old F with PMH of metastatic ovarian cancer with peritoneal carcinomatosis, lumbar arthritis spine mets, DDD, prior spinal fusion, CVA and mood disorder presenting from PM and R clinic due to progressive RLQ pain for few weeks and related ambulatory dysfunction due to pain, and admitted for pain control.  Subjective: Seen and examined earlier this morning.  No major events overnight of this morning.  Continues to endorse significant abdominal pain across lower abdomen.  She rates her pain 8/10.  Pain is worse with movement.  She also reports heartburn.  Denies UTI symptoms.  Denies nausea or vomiting.  Objective: Vitals:   12/20/22 0602 12/20/22 0850 12/20/22 1121 12/20/22 1329  BP: 109/60 (!) 153/69  (!) 102/57  Pulse: 61 67  63  Resp: 14 20  20   Temp: 97.7 F (36.5 C) 98.4 F (36.9 C)  98.3 F (36.8 C)  TempSrc: Oral Oral  Oral  SpO2: 93% 95%  92%  Weight:      Height:   5\' 3"  (1.6 m)     Examination:  GENERAL: No apparent distress.  Nontoxic. HEENT: MMM.  Vision and hearing grossly intact.  NECK: Supple.  No apparent JVD.  RESP:  No IWOB.  Fair aeration bilaterally. CVS:  RRR. Heart sounds normal.  ABD/GI/GU: BS+. Abd soft.  Tenderness across lower abdomen. MSK/EXT:  Moves extremities. No apparent deformity. No edema.  SKIN: no apparent skin lesion or wound NEURO: Awake, alert and oriented appropriately.  No apparent focal neuro deficit. PSYCH: Calm. Normal affect.   Procedures:  None  Microbiology summarized: None  Assessment and plan: Principal Problem:   Ovarian cancer (HCC) Active Problems:   Cancer associated pain   High risk medication use   Anxiety   Constipation   Medication management    Palliative care encounter  Acute on chronic cancer-related abdominal pain in patient with history of metastatic ovarian cancer: Uncontrolled on home regimen including fentanyl patch, gabapentin, Lyrica, Percocet and Cymbalta.  Some improvement with current regimen but still with significant abdominal pain -Appreciate help by palliative medicine-increased oxycodone, fentanyl patch and IV Dilaudid. -Continue gabapentin and Lyrica for radiculopathy. -Bowel regimen to counteract   Metastatic ovarian cancer with peritoneal carcinomatosis, thoracic and lumbar spine mets -Palliative/symptomatic management   Anemia of chronic disease/leukopenia: Likely due to malignancy.  No overt bleeding.  Some degree of iron deficiency on anemia panel. Recent Labs    07/01/22 1749 07/18/22 0804 08/04/22 1032 08/29/22 0855 09/26/22 0809 10/31/22 1236 11/13/22 1406 11/27/22 1118 12/19/22 1711 12/20/22 0657  HGB 11.2* 11.8* 10.8* 11.6* 11.6* 11.3* 9.9* 11.3* 9.2* 8.5*  -Continue monitoring -Follow Hemoccult    Physical deconditioning: Typically ambulatory with use of a walker  -PT/OT evaluation    Chronic medical problems:  Hx stroke: continue home statin  Mood d/o: continue home duloxetine  Hyperthyroidism: Continue home methimazole  Body mass index is 27.3 kg/m.          DVT prophylaxis:  SCDs Start: 12/19/22 2227  Code Status: DNR/DNI Family Communication: None at bedside Level of care: Med-Surg Status is: Observation The patient will require care spanning > 2 midnights and should be moved to inpatient because: Intractable cancer-related abdominal pain   Final disposition: Likely home once  pain fairly controlled Consultants:  Palliative medicine  35 minutes with more than 50% spent in reviewing records, counseling patient/family and coordinating care.   Sch Meds:  Scheduled Meds:  acetaminophen  1,000 mg Oral Q8H   Chlorhexidine Gluconate Cloth  6 each Topical Daily    DULoxetine  60 mg Oral Daily   fentaNYL  1 patch Transdermal Q72H   gabapentin  300 mg Oral TID   irbesartan  150 mg Oral Daily   [START ON 12/22/2022] methimazole  5 mg Oral Once per day on Monday Wednesday Friday   pantoprazole  40 mg Oral Daily   polyethylene glycol  17 g Oral BID   pregabalin  100 mg Oral TID   senna  2 tablet Oral BID   traZODone  100 mg Oral QHS   Continuous Infusions: PRN Meds:.alum & mag hydroxide-simeth, bisacodyl, clonazePAM, HYDROmorphone (DILAUDID) injection, melatonin, ondansetron (ZOFRAN) IV, [DISCONTINUED] oxyCODONE **OR** oxyCODONE  Antimicrobials: Anti-infectives (From admission, onward)    None        I have personally reviewed the following labs and images: CBC: Recent Labs  Lab 12/19/22 1711 12/20/22 0657  WBC 3.2* 2.9*  NEUTROABS 1.8  --   HGB 9.2* 8.5*  HCT 29.4* 27.9*  MCV 101.0* 103.3*  PLT 215 204   BMP &GFR Recent Labs  Lab 12/19/22 1711 12/20/22 0657  NA 137 138  K 3.8 3.7  CL 101 101  CO2 27 29  GLUCOSE 109* 101*  BUN 12 13  CREATININE 0.46 0.49  CALCIUM 9.2 8.8*  MG  --  1.9  PHOS  --  3.9   Estimated Creatinine Clearance: 50.8 mL/min (by C-G formula based on SCr of 0.49 mg/dL). Liver & Pancreas: Recent Labs  Lab 12/19/22 1711  AST 13*  ALT 9  ALKPHOS 104  BILITOT 0.5  PROT 6.3*  ALBUMIN 2.9*   Recent Labs  Lab 12/19/22 1711  LIPASE 21   No results for input(s): "AMMONIA" in the last 168 hours. Diabetic: No results for input(s): "HGBA1C" in the last 72 hours. No results for input(s): "GLUCAP" in the last 168 hours. Cardiac Enzymes: No results for input(s): "CKTOTAL", "CKMB", "CKMBINDEX", "TROPONINI" in the last 168 hours. No results for input(s): "PROBNP" in the last 8760 hours. Coagulation Profile: No results for input(s): "INR", "PROTIME" in the last 168 hours. Thyroid Function Tests: No results for input(s): "TSH", "T4TOTAL", "FREET4", "T3FREE", "THYROIDAB" in the last 72 hours. Lipid  Profile: No results for input(s): "CHOL", "HDL", "LDLCALC", "TRIG", "CHOLHDL", "LDLDIRECT" in the last 72 hours. Anemia Panel: Recent Labs    12/20/22 0715  VITAMINB12 1,046*  FOLATE 8.6  FERRITIN 61  TIBC 216*  IRON 20*   Urine analysis:    Component Value Date/Time   COLORURINE YELLOW 12/19/2022 1738   APPEARANCEUR CLEAR 12/19/2022 1738   LABSPEC 1.018 12/19/2022 1738   PHURINE 5.0 12/19/2022 1738   GLUCOSEU NEGATIVE 12/19/2022 1738   HGBUR NEGATIVE 12/19/2022 1738   BILIRUBINUR NEGATIVE 12/19/2022 1738   KETONESUR NEGATIVE 12/19/2022 1738   PROTEINUR NEGATIVE 12/19/2022 1738   UROBILINOGEN 0.2 02/21/2009 1122   NITRITE NEGATIVE 12/19/2022 1738   LEUKOCYTESUR MODERATE (A) 12/19/2022 1738   Sepsis Labs: Invalid input(s): "PROCALCITONIN", "LACTICIDVEN"  Microbiology: No results found for this or any previous visit (from the past 240 hour(s)).  Radiology Studies: CT T-SPINE NO CHARGE  Result Date: 12/20/2022 CLINICAL DATA:  Study identified as missing a report at 4:13 am on 12/20/2022. 82 year old female with abdomen and spine pain.  Ovarian cancer. EXAM: CT THORACIC SPINE WITH CONTRAST TECHNIQUE: Multiplanar CT images of the thoracic spine were reconstructed from contemporary CT of the Abdomen. RADIATION DOSE REDUCTION: This exam was performed according to the departmental dose-optimization program which includes automated exposure control, adjustment of the mA and/or kV according to patient size and/or use of iterative reconstruction technique. CONTRAST:  No additional COMPARISON:  CT Abdomen and Pelvis, lumbar spine CT from the same time reported separately. Restaging CT Chest, Abdomen, and Pelvis 10/15/2022. Outside thoracic spine MRI 02/22/2021. FINDINGS: Limited cervical spine imaging: Cervicothoracic junction alignment is within normal limits. Thoracic spine segmentation:  Normal. Alignment: Stable thoracic kyphosis. No significant scoliosis or spondylolisthesis.  Vertebrae: Mild chronic superior endplate deformities at T2, T4, T6, T9 are all stable since the 2022 MRI. Background bone mineralization is stable. Small round sclerotic bone lesions in T9 and T10 appear new from the 2022 MRI and are both mildly larger since August (now up to 8 mm versus 4-5 mm previously). Smaller similar T5 and T6 inferior endplate sclerotic foci have also progressed Increased. No destructive osseous lesion in the thoracic spine. No acute pathologic fracture. Visible ribs appear intact. Lumbar spine is detailed separately. Paraspinal and other soft tissues: Stable right chest Port-A-Cath. Calcified aortic atherosclerosis. Calcified coronary artery atherosclerosis. No pericardial effusion. Lower lung volumes. Small new layering left and trace right pleural effusions. Dependent and lung base atelectasis. Major airways remain patent. Abdomen is reported separately. Thoracic paraspinal soft tissues remain normal. Disc levels: Mild for age thoracic spine degeneration superimposed on the above findings. Degenerative appearing posterior element ankylosis in the upper thoracic spine at least T3 through T6. No CT evidence of thoracic spinal stenosis. IMPRESSION: 1. Small but progressive sclerotic bone metastases in thoracic vertebrae since August restaging CT. 2. No associated pathologic fracture. Multilevel mild chronic compression fractures in the thoracic spine are stable since 2022. 3. New small pleural effusions, pulmonary atelectasis. 4. Abdomen and Lumbar spine are detailed separately. Electronically Signed   By: Odessa Fleming M.D.   On: 12/20/2022 04:23   CT L-SPINE NO CHARGE  Result Date: 12/19/2022 CLINICAL DATA:  Back pain, lower extremity radicular pain EXAM: CT LUMBAR SPINE WITHOUT CONTRAST TECHNIQUE: Multidetector CT imaging of the lumbar spine was performed without intravenous contrast administration. Multiplanar CT image reconstructions were also generated. RADIATION DOSE REDUCTION: This  exam was performed according to the departmental dose-optimization program which includes automated exposure control, adjustment of the mA and/or kV according to patient size and/or use of iterative reconstruction technique. COMPARISON:  None Available. FINDINGS: Segmentation: 5 lumbar type vertebrae. Alignment: Normal. Vertebrae: L4-5 anterior posterior lumbar fusion with instrumentation again noted. Degenerative ankylosis of the anterior posterior elements of L5-S1 noted. Chronic L1 compression deformity with approximately 50% loss of height is unchanged. Sclerotic foci within the L2 and L5 vertebral bodies is again identified suspicious for sclerotic osseous metastases as these are stable since prior examination, but are new since remote prior examination of 03/17/2022. No acute fracture. Paraspinal and other soft tissues: Negative paraspinal soft tissues. Mass within the right hemipelvis is better assessed on accompanying CT examination of the abdomen and pelvis. Disc levels: Intervertebral disc space narrowing and endplate remodeling at L3-4 in keeping with changes of advanced degenerative disc disease. Remaining intervertebral disc heights are preserved. Eccentric left paracentral/foraminal disc bulge at L3-4 noted. Facet hypertrophy results in mild left and moderate right neuroforaminal narrowing at this level. Streak artifact limits evaluation of the neural foramina and spinal canal at L4-5. Bilateral  laminectomy and posterior decompression of L5 has been performed. No significant neuroforaminal narrowing or central canal stenosis at this level. IMPRESSION: 1. No acute fracture or listhesis of the lumbar spine. 2. Stable chronic L1 compression deformity with approximately 50% loss of height. 3. Stable sclerotic foci within the L2 and L5 vertebral bodies suspicious for sclerotic osseous metastases as these are stable since prior examination, but are new since remote prior examination of 03/17/2022. 4. L4-5  anterior posterior lumbar fusion with instrumentation. Degenerative ankylosis of the anterior posterior elements of L5-S1. 5. Advanced degenerative disease at L3-4 resulting in mild left and moderate right neuroforaminal narrowing at this level. Electronically Signed   By: Helyn Numbers M.D.   On: 12/19/2022 20:39   CT ABDOMEN PELVIS W CONTRAST  Result Date: 12/19/2022 CLINICAL DATA:  Abdominal pain EXAM: CT ABDOMEN AND PELVIS WITH CONTRAST TECHNIQUE: Multidetector CT imaging of the abdomen and pelvis was performed using the standard protocol following bolus administration of intravenous contrast. RADIATION DOSE REDUCTION: This exam was performed according to the departmental dose-optimization program which includes automated exposure control, adjustment of the mA and/or kV according to patient size and/or use of iterative reconstruction technique. CONTRAST:  OMNIPAQUE IOHEXOL 300 MG/ML  SOLN COMPARISON:  10/15/2022 FINDINGS: Lower chest: Trace left pleural effusion. Dependent atelectasis. Coronary artery and aortic atherosclerosis. Hepatobiliary: No focal hepatic abnormality. Gallbladder unremarkable. Pancreas: No focal abnormality or ductal dilatation. Spleen: No focal abnormality.  Normal size. Adrenals/Urinary Tract: No adrenal abnormality. No focal renal abnormality. No stones or hydronephrosis. Urinary bladder is unremarkable. Stomach/Bowel: Stomach, large and small bowel grossly unremarkable. Vascular/Lymphatic: Aortic atherosclerosis. No evidence of aneurysm or adenopathy. Reproductive: Prior hysterectomy. Mixed solid and cystic mass in the right ovary again measuring 3.9 x 3.4 cm, compared to 3.9 x 3.5 cm previously, unchanged. Other: Small amount of free fluid adjacent to the liver, spleen and in the pelvis. This is new since prior study. Musculoskeletal: Sclerotic area in the posterior L2 vertebral body, 7 mm compared to 6 mm previously. 1.5 cm sclerotic posterior L5 lesion, stable. Chronic  moderate L1 compression deformity, stable. Sclerotic lesions in the T9 and T10 vertebral body, similar to prior study. IMPRESSION: Stable left ovarian mass in this patient with known ovarian cancer. Essentially stable sclerotic foci in the thoracic and lumbar spine. Stable moderate compression fracture at L1. Small amount of ascites in the abdomen and pelvis, new since prior study. Trace left pleural effusion. Dependent atelectasis in the lower lobes. Aortic atherosclerosis.  Coronary artery disease. Electronically Signed   By: Charlett Nose M.D.   On: 12/19/2022 20:15   US Pelvis Complete  Result Date: 12/19/2022 CLINICAL DATA:  Right lower quadrant pain history of ovarian cancer EXAM: TRANSABDOMINAL ULTRASOUND OF PELVIS DOPPLER ULTRASOUND OF OVARIES TECHNIQUE: Transabdominal ultrasound examination of the pelvis was performed including evaluation of the uterus, ovaries, adnexal regions, and pelvic cul-de-sac. Color and duplex Doppler ultrasound was utilized to evaluate blood flow to the ovaries. COMPARISON:  CT 10/15/2022 FINDINGS: Uterus Hysterectomy Endometrium Hysterectomy Right ovary Measurements: 2.5 x 2.2 x 2.7 cm = volume: 8.9 mL. Cystic and soft tissue appearance of the right ovary presumably corresponding to the right adnexal mass seen on multiple prior CT imaging. This measures 3.8 x 3.9 x 4 cm. Left ovary Oophorectomy Pulsed Doppler evaluation demonstrates normal low-resistance arterial and venous waveforms in the right ovary and associated mass Other: Small volume free fluid in the pelvis. IMPRESSION: 1. Status post hysterectomy and left oophorectomy. 2. 4 cm cystic  and soft tissue appearance of the right ovary presumably corresponding to the given history and right adnexal mass seen on multiple prior CT imaging. No convincing evidence for torsion on this exam. 3. Small volume free fluid in the pelvis. Electronically Signed   By: Jasmine Pang M.D.   On: 12/19/2022 19:17   [place] Art/Ven Flow Abd Pelv  Doppler  Result Date: 12/19/2022 CLINICAL DATA:  Right lower quadrant pain history of ovarian cancer EXAM: TRANSABDOMINAL ULTRASOUND OF PELVIS DOPPLER ULTRASOUND OF OVARIES TECHNIQUE: Transabdominal ultrasound examination of the pelvis was performed including evaluation of the uterus, ovaries, adnexal regions, and pelvic cul-de-sac. Color and duplex Doppler ultrasound was utilized to evaluate blood flow to the ovaries. COMPARISON:  CT 10/15/2022 FINDINGS: Uterus Hysterectomy Endometrium Hysterectomy Right ovary Measurements: 2.5 x 2.2 x 2.7 cm = volume: 8.9 mL. Cystic and soft tissue appearance of the right ovary presumably corresponding to the right adnexal mass seen on multiple prior CT imaging. This measures 3.8 x 3.9 x 4 cm. Left ovary Oophorectomy Pulsed Doppler evaluation demonstrates normal low-resistance arterial and venous waveforms in the right ovary and associated mass Other: Small volume free fluid in the pelvis. IMPRESSION: 1. Status post hysterectomy and left oophorectomy. 2. 4 cm cystic and soft tissue appearance of the right ovary presumably corresponding to the given history and right adnexal mass seen on multiple prior CT imaging. No convincing evidence for torsion on this exam. 3. Small volume free fluid in the pelvis. Electronically Signed   By: Jasmine Pang M.D.   On: 12/19/2022 19:17      Cleta Heatley T. Ashe [facility]  If 7PM-7AM, please contact night-coverage [email or web address] 12/20/2022, 3:07 PM

## 2022-12-20 NOTE — ED Notes (Signed)
Removed Fentanyl patch from R upper chest and placed new patch on L upper shoulder

## 2022-12-20 NOTE — Plan of Care (Signed)
  Problem: Education: Goal: Knowledge of General Education information will improve Description: Including pain rating scale, medication(s)/side effects and non-pharmacologic comfort measures Outcome: Progressing   Problem: Clinical Measurements: Goal: Ability to maintain clinical measurements within normal limits will improve Outcome: Progressing   Problem: Activity: Goal: Risk for activity intolerance will decrease Outcome: Progressing   Problem: Nutrition: Goal: Adequate nutrition will be maintained Outcome: Progressing   Problem: Coping: Goal: Level of anxiety will decrease Outcome: Progressing   Problem: Elimination: Goal: Will not experience complications related to bowel motility Outcome: Progressing   Problem: Pain Management: Goal: General experience of comfort will improve Outcome: Progressing   Problem: Safety: Goal: Ability to remain free from injury will improve Outcome: Progressing   Problem: Skin Integrity: Goal: Risk for impaired skin integrity will decrease Outcome: Progressing

## 2022-12-20 NOTE — Consult Note (Signed)
Consultation Note Date: 12/20/2022   Patient Name: Alexandria Mclaughlin  DOB: [birth year]  MRN: [id number]  Age / Sex: 82 y.o., female   PCP: Artis Delay, MD Referring Physician: Almon Hercules, MD  Reason for Consultation: Pain control     Chief Complaint/History of Present Illness:   Patient is an 82 year old female with a past medical history of metastatic ovarian cancer with peritoneal carcinomatosis as well as metastatic disease to lumbar and thoracic spine, degenerative disc disease with prior spinal fusion, CVA, on 12/19/2022 from [facility] clinic for management of severe right lower quadrant pain.  Imaging obtained on admission notes new small ascites in the abdomen and pelvis.  Patient has known right ovarian mass with spine mets and prior compression fracture.  Palliative medicine team consulted to assist with complex medical decision making.  Reviewed EMR prior to presenting to bedside.  At time of EMR review patient receiving: Tylenol 1000 mg every 6 hours scheduled, duloxetine 70 mg daily, fentanyl 25 mcg/h patch every 72 hours, gabapentin 300 mg 4 times daily, pregabalin 100 mg 3 times daily, oxycodone 10-15 mg every 6 hours as needed, and IV Dilaudid 0.5 mg every 4 hours as needed.  Presented to bedside to meet with patient.  RN present at bedside as well for medical updates.  RN had confirmed patient is on gabapentin as well as Lyrica.  When presenting to bedside, patient laying in bed.  When patient moved to sit up in bed, actively grimacing in pain.  Introduced myself and the role of the palliative medicine team in patient's medical care.  Spent time learning about patient's pain.  Patient sees PMNR for chronic related back pain.  Patient notes this acute pain is in her right lower quadrant and does not go around to her back.  Patient notes this pain has been worsening over the past 3 weeks.  Patient has known right ovarian mass from cancer.  Spent time reviewing all of patient's pain  medications and pain history.  Patient has been on medication such as duloxetine, gabapentin, and fentanyl patch for many years due to her chronic related back pain.  Patient noted that she was started on pregabalin a few weeks ago and noted that her provider was doing both at the same time currently though sounded like plan was to eventually wean off gabapentin. Inquired about pain management with her current pain medicines.  Patient notes that she is fine as long as she "does not move".  Patient feels that the medications like the oxycodone 15 mg as needed and IV Dilaudid 0.5 mg as needed do help to manage her pain though only bring it down slightly and do not allow her to actively move around and be functional.  Patient denied that any of these medications have ever made her sleepy or confused.  Patient noted at one  point she was on a fentanyl patch up to 75 mcg/h every 72 hours when her back pain was very cute. Spent time reviewing plan for today regarding patient's pain medications which she agreed with including: Tylenol scheduled 1000 mg every 8 hours during the day, duloxetine 60 mg daily, decreasing gabapentin to 300 mg 3 times daily, continuing pregabalin 100 mg 3 times daily, increasing fentanyl patch to 50 mcg/h every 72 hours, increasing oxycodone to 15-20 mg every 4 hours as needed, increasing IV Dilaudid to 1 mg every 2 hours as needed for breakthrough management after oral medications.  Inquired about patient's anxiety which she noted she  takes clonazepam 0.5 mg every night and this manages this well for her.  Noted this has been continued during hospitalization.  Inquired about patient's last bowel movement.  Patient notes that her last bowel movement was on Thursday and was "normal".  Patient notes that she had previously been going every day though this has slowed down recently to once every other day.  Patient currently on scheduled medications for constipation though noted need to have  bowel movement today or tomorrow so does not worsen abdominal pain.  Patient agreed with this plan.  All questions answered at that time.  Noted palliative medicine team continue to follow along with patient's medical journey and make adjustments accordingly.  Primary Diagnoses  Present on Admission:  Ovarian cancer [facility])   Palliative Review of Systems: RLQ pain   Past Medical History:  Diagnosis Date   Abnormality of gait 02/07/2015   Acquired hallux rigidus of left foot 12/17/2020   Acquired unequal leg length on left 08/08/2011   AMS (altered mental status) 12/28/2020   Biceps tendonitis on left 08/18/2018   BPPV (benign paroxysmal positional vertigo) 02/07/2015   Bunion 12/17/2020   Cancer (HCC) 04/25/2022   Cerebrovascular disease    Cervical facet syndrome    Coronary artery disease 04/27/2022   Disorders of sacrum    Enthesopathy of hip region    Essential hypertension 02/07/2015   Generalized anxiety disorder    GERD (gastroesophageal reflux disease)    Headache    History of kidney stones    HLD (hyperlipidemia) 02/07/2015   Low back pain 02/07/2015   Lumbar facet arthropathy 07/05/2014   Lumbar post-laminectomy syndrome 06/11/2011   Lumbar radicular pain 07/29/2016   Lumbar radiculopathy 04/26/2018   Major depressive disorder    Mild cognitive impairment of uncertain or unknown etiology 01/24/2022   Multiple lacunar infarcts    MRI - bilateral basal ganglia and left thalamus   Myoclonus 09/19/2015   Rotator cuff tendonitis, left 06/08/2018   Sacroiliac joint dysfunction 06/11/2011   Sciatic neuritis    Stroke (HCC)    hx TIA   Subcortical infarction    2016 MRI - small chronic subcortical infarct with associated chronic hemosiderin deposition within the right superior frontal gyrus.   Synovitis and tenosynovitis    Therapeutic opioid induced constipation 07/25/2015   Thyroid disease    Transient left leg weakness    Vision abnormalities    Social  History   Socioeconomic History   Marital status: Widowed    Spouse name: Not on file   Number of children: Not on file   Years of education: 14   Highest education level: Associate degree: academic program  Occupational History   Occupation: Retired  Tobacco Use   Smoking status: Former    Current packs/day: 0.00    Average packs/day: 0.3 packs/day for 15.0 years (3.8 ttl pk-yrs)    Types: Cigarettes    Start date: 03/15/1953    Quit date: 03/15/1968    Years since quitting: 54.8   Smokeless tobacco: Never  Vaping Use   Vaping status: Never Used  Substance and Sexual Activity   Alcohol use: Not Currently    Comment: occasional glass of wine   Drug use: Yes    Frequency: 20.0 times per week    Types: Fentanyl, Hydrocodone   Sexual activity: Not Currently  Other Topics Concern   Not on file  Social History Narrative   Right handed   Drinks caffeine   Condo two story  with elevator   Social Determinants of Health   Financial Resource Strain: Not on file  Food Insecurity: No Food Insecurity (12/20/2022)   Hunger Vital Sign    Worried About Running Out of Food in the Last Year: Never true    Ran Out of Food in the Last Year: Never true  Transportation Needs: No Transportation Needs (12/20/2022)   PRAPARE - Administrator, Civil Service (Medical): No    Lack of Transportation (Non-Medical): No  Physical Activity: Not on file  Stress: Not on file  Social Connections: Unknown (07/04/2021)   Received from [facility], [facility]   Social Network    Social Network: Not on file   Family History  Problem Relation Age of Onset   Diabetes Mother    Heart disease Mother    Stroke Mother    Heart disease Father    Heart disease Brother    Colon cancer Neg Hx    Breast cancer Neg Hx    Ovarian cancer Neg Hx    Endometrial cancer Neg Hx    Pancreatic cancer Neg Hx    Prostate cancer Neg Hx    Scheduled Meds:  acetaminophen  1,000 mg Oral Q6H    Chlorhexidine Gluconate Cloth  6 each Topical Daily   DULoxetine  60 mg Oral Daily   fentaNYL  1 patch Transdermal Q72H   gabapentin  300 mg Oral QID   irbesartan  150 mg Oral Daily   [START ON 12/22/2022] methimazole  5 mg Oral Once per day on Monday Wednesday Friday   polyethylene glycol  17 g Oral BID   pregabalin  100 mg Oral TID   senna  2 tablet Oral BID   traZODone  100 mg Oral QHS   Continuous Infusions: PRN Meds:.bisacodyl, clonazePAM, HYDROmorphone (DILAUDID) injection, melatonin, ondansetron (ZOFRAN) IV, oxyCODONE **OR** oxyCODONE No Known Allergies CBC:    Component Value Date/Time   WBC 2.9 (L) 12/20/2022 0657   HGB 8.5 (L) 12/20/2022 0657   HGB 11.3 (L) 11/27/2022 1118   HGB 14.2 09/19/2015 1422   HCT 27.9 (L) 12/20/2022 0657   HCT 40.9 09/19/2015 1422   PLT 204 12/20/2022 0657   PLT 235 11/27/2022 1118   PLT 204 09/19/2015 1422   MCV 103.3 (H) 12/20/2022 0657   MCV 87 09/19/2015 1422   NEUTROABS 1.8 12/19/2022 1711   LYMPHSABS 0.7 12/19/2022 1711   MONOABS 0.7 12/19/2022 1711   EOSABS 0.0 12/19/2022 1711   BASOSABS 0.0 12/19/2022 1711   Comprehensive Metabolic Panel:    Component Value Date/Time   NA 138 12/20/2022 0657   NA 139 09/19/2015 1422   K 3.7 12/20/2022 0657   CL 101 12/20/2022 0657   CO2 29 12/20/2022 0657   BUN 13 12/20/2022 0657   BUN 20 09/19/2015 1422   CREATININE 0.49 12/20/2022 0657   CREATININE 0.53 11/27/2022 1118   GLUCOSE 101 (H) 12/20/2022 0657   CALCIUM 8.8 (L) 12/20/2022 0657   AST 13 (L) 12/19/2022 1711   AST 12 (L) 11/27/2022 1118   ALT 9 12/19/2022 1711   ALT 6 11/27/2022 1118   ALKPHOS 104 12/19/2022 1711   BILITOT 0.5 12/19/2022 1711   BILITOT 0.5 11/27/2022 1118   PROT 6.3 (L) 12/19/2022 1711   PROT 7.0 09/19/2015 1422   ALBUMIN 2.9 (L) 12/19/2022 1711   ALBUMIN 4.6 09/19/2015 1422    Physical Exam: Vital Signs: BP (!) 153/69 (BP Location: Right Arm)   Pulse 67  Temp 98.4 F (36.9 C) (Oral)   Resp 20   Wt  69.9 kg   SpO2 95%   BMI 27.30 kg/m  SpO2: SpO2: 95 % O2 Device: O2 Device: Room Air O2 Flow Rate:   Intake/output summary: No intake or output data in the 24 hours ending 12/20/22 1037 LBM: Last BM Date : 12/18/22 Baseline Weight: Weight: 69.9 kg Most recent weight: Weight: 69.9 kg  General: NAD, alert, chronically ill-appearing, laying in bed, grimacing with movement HENT: moist mucous membranes Cardiovascular: RRR Respiratory: no increased work of breathing noted, not in respiratory distress Neuro: A&Ox4, following commands easily Psych: appropriately answers all questions          Palliative Performance Scale: 50%              Additional Data Reviewed: Recent Labs    12/19/22 1711 12/20/22 0657  WBC 3.2* 2.9*  HGB 9.2* 8.5*  PLT 215 204  NA 137 138  BUN 12 13  CREATININE 0.46 0.49    Imaging: CT T-SPINE NO CHARGE CLINICAL DATA:  Study identified as missing a report at 4:13 am on 12/20/2022.  82 year old female with abdomen and spine pain.  Ovarian cancer.  EXAM: CT THORACIC SPINE WITH CONTRAST  TECHNIQUE: Multiplanar CT images of the thoracic spine were reconstructed from contemporary CT of the Abdomen.  RADIATION DOSE REDUCTION: This exam was performed according to the departmental dose-optimization program which includes automated exposure control, adjustment of the mA and/or kV according to patient size and/or use of iterative reconstruction technique.  CONTRAST:  No additional  COMPARISON:  CT Abdomen and Pelvis, lumbar spine CT from the same time reported separately.  Restaging CT Chest, Abdomen, and Pelvis 10/15/2022.  Outside thoracic spine MRI 02/22/2021.  FINDINGS: Limited cervical spine imaging: Cervicothoracic junction alignment is within normal limits.  Thoracic spine segmentation:  Normal.  Alignment: Stable thoracic kyphosis. No significant scoliosis or spondylolisthesis.  Vertebrae: Mild chronic superior endplate deformities  at T2, T4, T6, T9 are all stable since the 2022 MRI.  Background bone mineralization is stable.  Small round sclerotic bone lesions in T9 and T10 appear new from the 2022 MRI and are both mildly larger since August (now up to 8 mm versus 4-5 mm previously). Smaller similar T5 and T6 inferior endplate sclerotic foci have also progressed  Increased.  No destructive osseous lesion in the thoracic spine. No acute pathologic fracture.  Visible ribs appear intact.  Lumbar spine is detailed separately.  Paraspinal and other soft tissues: Stable right chest Port-A-Cath. Calcified aortic atherosclerosis. Calcified coronary artery atherosclerosis. No pericardial effusion. Lower lung volumes. Small new layering left and trace right pleural effusions. Dependent and lung base atelectasis. Major airways remain patent.  Abdomen is reported separately.  Thoracic paraspinal soft tissues remain normal.  Disc levels:  Mild for age thoracic spine degeneration superimposed on the above findings. Degenerative appearing posterior element ankylosis in the upper thoracic spine at least T3 through T6. No CT evidence of thoracic spinal stenosis.  IMPRESSION: 1. Small but progressive sclerotic bone metastases in thoracic vertebrae since August restaging CT. 2. No associated pathologic fracture. Multilevel mild chronic compression fractures in the thoracic spine are stable since 2022. 3. New small pleural effusions, pulmonary atelectasis. 4. Abdomen and Lumbar spine are detailed separately.  Electronically Signed   By: Odessa Fleming M.D.   On: 12/20/2022 04:23    I personally reviewed recent imaging.   Palliative Care Assessment and Plan Summary of Established Goals of Care  and Medical Treatment Preferences   Patient is an 82 year old female with a past medical history of metastatic ovarian cancer with peritoneal carcinomatosis as well as metastatic disease to lumbar and thoracic spine,  degenerative disc disease with prior spinal fusion, CVA, on 12/19/2022 from [facility] clinic for management of severe right lower quadrant pain.  Imaging obtained on admission notes new small ascites in the abdomen and pelvis.  Patient has known right ovarian mass with spine mets and prior compression fracture.  Palliative medicine team consulted to assist with complex medical decision making.  # Complex medical decision making/goals of care  -Did not discus goals for medical care at this time as managing pain symptoms currently.   -  Code Status: Limited: Do not attempt resuscitation (DNR) -DNR-LIMITED -Do Not Intubate/DNI    # Symptom management  -Pain, in setting of metastatic ovarian cancer   -Change Tylenol scheduled 1000 mg every 8 hours during the day -Continue duloxetine 60 mg daily -Decrease gabapentin to 300 mg 3 times daily -Continue pregabalin 100 mg 3 times daily -Increase fentanyl patch to 50 mcg/h every 72 hours -Increase oxycodone to 15-20 mg every 4 hours as needed -Increase IV Dilaudid to 1 mg every 2 hours as needed for breakthrough management after oral medications.   -Anxiety   -Continue home medication of clonazepam 5 mg nightly   -Constipation   -Continue MiraLAX 17 g twice daily   -Continue senna 2 tabs daily   -Continue bisacodyl suppository daily as needed.  May need to consider administration if patient does not have bowel movement by tomorrow  # Psycho-social/Spiritual Support:  - Support System: son who she lives with  # Discharge Planning:  To Be Determined  -Patient will need referral for follow up with PMT at [facility] to continue with symptom management.  Thank you for allowing the palliative care team to participate in the care [name].  Alvester Morin, DO Palliative Care Provider PMT # [phone]  If patient remains symptomatic despite maximum doses, please call PMT at [phone] between 0700 and 1900. Outside of these hours, please call attending, as  PMT does not have night coverage.  *Please note that this is a verbal dictation therefore any spelling or grammatical errors are due to the "[employer]agon [employer] One" system interpretation.

## 2022-12-20 NOTE — Evaluation (Signed)
Physical Therapy Evaluation Patient Details Name: Alexandria Mclaughlin MRN: [id number] DOB: 29-Nov-1940 Today's Date: 12/20/2022  History of Present Illness  82 y.o. female with hx metastatic ovarian cancer with peritoneal carcinomatosis, mets to lumbar and thoracic spine, degenerative disc disease, prior spinal fusion, CVA, mood disorder, who was brought in from PM+R clinic due to subacute on chronic progressive RLQ pain suspect related to her R sided ovarian mass.  Clinical Impression  Pt admitted with above diagnosis.  Pt currently with functional limitations due to the deficits listed below (see PT Problem List). Pt will benefit from acute skilled PT to increase their independence and safety with mobility to allow discharge.  Pt reports mostly being bed-bound the past 2 weeks due to increasing abdominal pain.  Pt has RW at home and bathroom is close to her bed.  Pt lives with her son who she reports works during the day.  Pt anticipating needing more assist upon d/c as she states she has had discussion of bringing in caregiver with son prior to admission (however nothing set up yet).  [name] mobility mostly limited by her pain.         If plan is discharge home, recommend the following: A little help with walking and/or transfers;A little help with bathing/dressing/bathroom;Assistance with cooking/housework;Assist for transportation;Help with stairs or ramp for entrance   Can travel by private vehicle        Equipment Recommendations None recommended by PT  Recommendations for Other Services       Functional Status Assessment Patient has had a recent decline in their functional status and demonstrates the ability to make significant improvements in function in a reasonable and predictable amount of time.     Precautions / Restrictions Precautions Precautions: Fall      Mobility  Bed Mobility Overal bed mobility: Needs Assistance Bed Mobility: Rolling, Sidelying to Sit, Sit to  Sidelying Rolling: Contact guard assist Sidelying to sit: Contact guard assist     Sit to sidelying: Mod assist General bed mobility comments: cues for log roll technique, assist for LEs onto bed; increased RLQ abdominal pain with movement    Transfers Overall transfer level: Needs assistance Equipment used: Rolling walker (2 wheels) Transfers: Sit to/from Stand Sit to Stand: Min assist           General transfer comment: cues for hand placement, assist to rise and control descent    Ambulation/Gait Ambulation/Gait assistance: Contact guard assist Gait Distance (Feet): 25 Feet Assistive device: Rolling walker (2 wheels) Gait Pattern/deviations: Step-through pattern, Decreased stride length, Trunk flexed Gait velocity: decr     General Gait Details: reports forward flexed posture for approx 2 weeks due to pain; distance to tolerance, total approx 25 ft to bathroom and into hallway; provided recliner  Stairs            Wheelchair Mobility     Tilt Bed    Modified Rankin (Stroke Patients Only)       Balance                                             Pertinent Vitals/Pain Pain Assessment Pain Assessment: Faces Faces Pain Scale: Hurts even more Pain Location: abdomen with movement, decreases with rest Pain Descriptors / Indicators: Grimacing, Guarding Pain Intervention(s): Repositioned, Monitored during session    Home Living Family/patient expects to be discharged to:: Private  residence Living Arrangements: Children (son)     Home Access: Elevator       Home Layout: One level Home Equipment: Agricultural consultant (2 wheels)      Prior Function Prior Level of Function : Independent/Modified Independent             Mobility Comments: using RW       Extremity/Trunk Assessment        Lower Extremity Assessment Lower Extremity Assessment: Generalized weakness    Cervical / Trunk Assessment Cervical / Trunk Assessment:  Kyphotic  Communication   Communication Communication: Hearing impairment  Cognition Arousal: Alert Behavior During Therapy: WFL for tasks assessed/performed Overall Cognitive Status: Within Functional Limits for tasks assessed                                          General Comments      Exercises     Assessment/Plan    PT Assessment Patient needs continued PT services  PT Problem List Decreased strength;Pain;Decreased balance;Decreased mobility;Decreased activity tolerance;Decreased knowledge of use of DME       PT Treatment Interventions DME instruction;Gait training;Balance training;Functional mobility training;Therapeutic exercise;Patient/family education;Therapeutic activities    PT Goals (Current goals can be found in the Care Plan section)  Acute Rehab PT Goals PT Goal Formulation: With patient Time For Goal Achievement: 01/03/23 Potential to Achieve Goals: Good    Frequency Min 1X/week     Co-evaluation               AM-PAC PT "6 Clicks" Mobility  Outcome Measure Help needed turning from your back to your side while in a flat bed without using bedrails?: A Little Help needed moving from lying on your back to sitting on the side of a flat bed without using bedrails?: A Lot Help needed moving to and from a bed to a chair (including a wheelchair)?: A Little Help needed standing up from a chair using your arms (e.g., wheelchair or bedside chair)?: A Little Help needed to walk in hospital room?: A Little Help needed climbing 3-5 steps with a railing? : A Lot 6 Click Score: 16    End of Session Equipment Utilized During Treatment: Gait belt Activity Tolerance: Patient tolerated treatment well Patient left: with call bell/phone within reach;in bed;with bed alarm set   PT Visit Diagnosis: Difficulty in walking, not elsewhere classified (R26.2)    Time: 9147-8295 PT Time Calculation (min) (ACUTE ONLY): 20 min   Charges:   PT  Evaluation $PT Eval Low Complexity: 1 Low   PT General Charges $$ ACUTE PT VISIT: 1 Visit        Paulino Door, DPT Physical Therapist Acute Rehabilitation Services Office: [phone]   Janan Halter [place] 12/20/2022, 12:49 PM

## 2022-12-21 DIAGNOSIS — C7951 Secondary malignant neoplasm of bone: Secondary | ICD-10-CM | POA: Diagnosis not present

## 2022-12-21 DIAGNOSIS — K5900 Constipation, unspecified: Secondary | ICD-10-CM | POA: Diagnosis not present

## 2022-12-21 DIAGNOSIS — E785 Hyperlipidemia, unspecified: Secondary | ICD-10-CM | POA: Diagnosis not present

## 2022-12-21 DIAGNOSIS — Z515 Encounter for palliative care: Secondary | ICD-10-CM | POA: Diagnosis not present

## 2022-12-21 DIAGNOSIS — G253 Myoclonus: Secondary | ICD-10-CM | POA: Diagnosis not present

## 2022-12-21 DIAGNOSIS — Z66 Do not resuscitate: Secondary | ICD-10-CM | POA: Diagnosis not present

## 2022-12-21 DIAGNOSIS — G9341 Metabolic encephalopathy: Secondary | ICD-10-CM | POA: Diagnosis not present

## 2022-12-21 DIAGNOSIS — Z7189 Other specified counseling: Secondary | ICD-10-CM | POA: Diagnosis not present

## 2022-12-21 DIAGNOSIS — R4589 Other symptoms and signs involving emotional state: Secondary | ICD-10-CM

## 2022-12-21 DIAGNOSIS — M4854XA Collapsed vertebra, not elsewhere classified, thoracic region, initial encounter for fracture: Secondary | ICD-10-CM | POA: Diagnosis not present

## 2022-12-21 DIAGNOSIS — D638 Anemia in other chronic diseases classified elsewhere: Secondary | ICD-10-CM | POA: Diagnosis not present

## 2022-12-21 DIAGNOSIS — C569 Malignant neoplasm of unspecified ovary: Secondary | ICD-10-CM | POA: Diagnosis not present

## 2022-12-21 DIAGNOSIS — Z79899 Other long term (current) drug therapy: Secondary | ICD-10-CM | POA: Diagnosis not present

## 2022-12-21 DIAGNOSIS — G893 Neoplasm related pain (acute) (chronic): Secondary | ICD-10-CM | POA: Diagnosis present

## 2022-12-21 DIAGNOSIS — F411 Generalized anxiety disorder: Secondary | ICD-10-CM | POA: Diagnosis not present

## 2022-12-21 DIAGNOSIS — R443 Hallucinations, unspecified: Secondary | ICD-10-CM | POA: Diagnosis not present

## 2022-12-21 DIAGNOSIS — G894 Chronic pain syndrome: Secondary | ICD-10-CM | POA: Diagnosis not present

## 2022-12-21 DIAGNOSIS — C561 Malignant neoplasm of right ovary: Secondary | ICD-10-CM | POA: Diagnosis not present

## 2022-12-21 DIAGNOSIS — M47816 Spondylosis without myelopathy or radiculopathy, lumbar region: Secondary | ICD-10-CM | POA: Diagnosis not present

## 2022-12-21 DIAGNOSIS — R102 Pelvic and perineal pain: Secondary | ICD-10-CM | POA: Diagnosis not present

## 2022-12-21 DIAGNOSIS — E0590 Thyrotoxicosis, unspecified without thyrotoxic crisis or storm: Secondary | ICD-10-CM | POA: Diagnosis not present

## 2022-12-21 DIAGNOSIS — R531 Weakness: Secondary | ICD-10-CM | POA: Diagnosis not present

## 2022-12-21 DIAGNOSIS — I2510 Atherosclerotic heart disease of native coronary artery without angina pectoris: Secondary | ICD-10-CM | POA: Diagnosis not present

## 2022-12-21 DIAGNOSIS — D539 Nutritional anemia, unspecified: Secondary | ICD-10-CM | POA: Diagnosis not present

## 2022-12-21 DIAGNOSIS — R269 Unspecified abnormalities of gait and mobility: Secondary | ICD-10-CM | POA: Diagnosis not present

## 2022-12-21 DIAGNOSIS — F39 Unspecified mood [affective] disorder: Secondary | ICD-10-CM | POA: Diagnosis not present

## 2022-12-21 DIAGNOSIS — G928 Other toxic encephalopathy: Secondary | ICD-10-CM | POA: Diagnosis not present

## 2022-12-21 DIAGNOSIS — C786 Secondary malignant neoplasm of retroperitoneum and peritoneum: Secondary | ICD-10-CM | POA: Diagnosis not present

## 2022-12-21 DIAGNOSIS — D72819 Decreased white blood cell count, unspecified: Secondary | ICD-10-CM | POA: Diagnosis not present

## 2022-12-21 DIAGNOSIS — I10 Essential (primary) hypertension: Secondary | ICD-10-CM | POA: Diagnosis not present

## 2022-12-21 DIAGNOSIS — R188 Other ascites: Secondary | ICD-10-CM | POA: Diagnosis not present

## 2022-12-21 DIAGNOSIS — F419 Anxiety disorder, unspecified: Secondary | ICD-10-CM | POA: Diagnosis not present

## 2022-12-21 DIAGNOSIS — R109 Unspecified abdominal pain: Secondary | ICD-10-CM | POA: Diagnosis not present

## 2022-12-21 LAB — CBC
HCT: 31.3 % — ABNORMAL LOW (ref 36.0–46.0)
Hemoglobin: 9.6 g/dL — ABNORMAL LOW (ref 12.0–15.0)
MCH: 31.5 pg (ref 26.0–34.0)
MCHC: 30.7 g/dL (ref 30.0–36.0)
MCV: 102.6 fL — ABNORMAL HIGH (ref 80.0–100.0)
Platelets: 234 10*3/uL (ref 150–400)
RBC: 3.05 MIL/uL — ABNORMAL LOW (ref 3.87–5.11)
RDW: 14.5 % (ref 11.5–15.5)
WBC: 3.8 10*3/uL — ABNORMAL LOW (ref 4.0–10.5)
nRBC: 0 % (ref 0.0–0.2)

## 2022-12-21 LAB — RENAL FUNCTION PANEL
Albumin: 2.8 g/dL — ABNORMAL LOW (ref 3.5–5.0)
Anion gap: 10 (ref 5–15)
BUN: 13 mg/dL (ref 8–23)
CO2: 28 mmol/L (ref 22–32)
Calcium: 9.4 mg/dL (ref 8.9–10.3)
Chloride: 101 mmol/L (ref 98–111)
Creatinine, Ser: 0.51 mg/dL (ref 0.44–1.00)
GFR, Estimated: >60 mL/min (ref >60)
Glucose, Bld: 115 mg/dL — ABNORMAL HIGH (ref 70–99)
Phosphorus: 4.1 mg/dL (ref 2.5–4.6)
Potassium: 3.9 mmol/L (ref 3.5–5.1)
Sodium: 139 mmol/L (ref 135–145)

## 2022-12-21 LAB — MAGNESIUM: Magnesium: 2 mg/dL (ref 1.7–2.4)

## 2022-12-21 MED ORDER — HYDROMORPHONE HCL 1 MG/ML IJ SOLN
0.5000 mg | INTRAMUSCULAR | Status: DC | PRN
  Administered 2022-12-21: 0.5 mg via INTRAVENOUS
  Administered 2022-12-21: 1 mg via INTRAVENOUS
  Filled 2022-12-21 (×2): qty 1

## 2022-12-21 MED ORDER — HYDROMORPHONE HCL 2 MG PO TABS
2.0000 mg | ORAL_TABLET | ORAL | Status: DC | PRN
  Administered 2022-12-21 – 2022-12-22 (×2): 2 mg via ORAL
  Filled 2022-12-21 (×2): qty 1

## 2022-12-21 MED ORDER — GABAPENTIN 300 MG PO CAPS
300.0000 mg | ORAL_CAPSULE | Freq: Two times a day (BID) | ORAL | Status: DC
  Administered 2022-12-21 – 2022-12-22 (×2): 300 mg via ORAL
  Filled 2022-12-21 (×2): qty 1

## 2022-12-21 NOTE — Plan of Care (Signed)
  Problem: Education: Goal: Knowledge of General Education information will improve Description: Including pain rating scale, medication(s)/side effects and non-pharmacologic comfort measures Outcome: Progressing   Problem: Health Behavior/Discharge Planning: Goal: Ability to manage health-related needs will improve Outcome: Progressing   Problem: Clinical Measurements: Goal: Will remain free from infection Outcome: Progressing Goal: Respiratory complications will improve Outcome: Progressing   Problem: Activity: Goal: Risk for activity intolerance will decrease Outcome: Progressing   Problem: Nutrition: Goal: Adequate nutrition will be maintained Outcome: Progressing   Problem: Coping: Goal: Level of anxiety will decrease Outcome: Progressing   Problem: Pain Management: Goal: General experience of comfort will improve Outcome: Progressing   Problem: Safety: Goal: Ability to remain free from injury will improve Outcome: Progressing   Problem: Skin Integrity: Goal: Risk for impaired skin integrity will decrease Outcome: Progressing

## 2022-12-21 NOTE — Plan of Care (Signed)
  Problem: Education: Goal: Knowledge of General Education information will improve Description: Including pain rating scale, medication(s)/side effects and non-pharmacologic comfort measures Outcome: Progressing   Problem: Health Behavior/Discharge Planning: Goal: Ability to manage health-related needs will improve Outcome: Progressing   Problem: Clinical Measurements: Goal: Ability to maintain clinical measurements within normal limits will improve Outcome: Progressing Goal: Will remain free from infection Outcome: Progressing Goal: Diagnostic test results will improve Outcome: Progressing Goal: Respiratory complications will improve Outcome: Progressing Goal: Cardiovascular complication will be avoided Outcome: Progressing   Problem: Activity: Goal: Risk for activity intolerance will decrease Outcome: Progressing   Problem: Nutrition: Goal: Adequate nutrition will be maintained Outcome: Progressing   Problem: Coping: Goal: Level of anxiety will decrease Outcome: Progressing   Problem: Elimination: Goal: Will not experience complications related to urinary retention Outcome: Progressing   Problem: Pain Management: Goal: General experience of comfort will improve Outcome: Progressing   Problem: Safety: Goal: Ability to remain free from injury will improve Outcome: Progressing   Problem: Skin Integrity: Goal: Risk for impaired skin integrity will decrease Outcome: Progressing

## 2022-12-21 NOTE — Progress Notes (Signed)
Daily Progress Note   Patient Name: Alexandria Mclaughlin       Date: 12/21/2022 DOB: 03-16-1940  Age: 82 y.o. MRN#: [id number] Attending Physician: Almon Hercules, MD Primary Care Physician: Artis Delay, MD Admit Date: 12/19/2022 Length of Stay: 0 days  Reason for Consultation/Follow-up: Pain control  Subjective:   CC: Patient notes pain still uncontrolled. Following up regarding complex medical decision making.   Subjective:  Reviewed EMR prior to presenting to bedside.  At time of EMR review patient had received an IV Dilaudid 1 mg x 5 doses, and oxycodone as needed 20 mg x 3 doses.  Patient's fentanyl patch was increased to 50 mcg/h every 72 hours on 12/20/2022.  Presented to bedside to meet with patient.  Patient laying in the bed.  Patient appears more comfortable at this time though notes her pain is still a 7 out of 10.  Patient's son, Alexandria Mclaughlin, present at bedside as well.  Introduced myself as a member of the palliative medicine team.  Patient is somewhat slower to respond to questioning today.  Son at bedside acknowledges this though notes she is not in as much pain as she has been.  Discussed continuing to adjust pain medications to determine what will most appropriately help patient while balancing risk and benefits of medication.  Patient does not feel the increased dose of oxycodone is helping her at all.  Discussed opioid rotation to oral Dilaudid since she has been on oxycodone for a while.  Discussed can always decrease fentanyl patch if needed though based on time it was put on to patient, has likely not had full effect yet.  Hopefully patient will need less PRNs with the fentanyl patch in place.  Discussed will also adjust patient's IV Dilaudid as needed dosing.  Patient and son agreeing with this plan. Patient denied having a bowel movement yet so discussed need for suppository today which patient agreed with having.   Patient's son, Alexandria Mclaughlin, asking about patient's overall prognosis.   Expressed concern with patient's continued progression of metastatic disease, concerning timeline could be 3 months or less.  Son acknowledges and noted realizing that patient's care at this point is essentially comfort focused.  Son acknowledging that he just does not want to see his mother and the pain she has been in.  Acknowledged continuing to adjust pain medications to hopefully balance pain management and sedation. Son agreed with this.    Checked in with RN a little after 3PM who noted patient was alter and oriented and wide awake and noting continued pain. Patient had just received prn dilaudid. Noted could adjust this dose if needed.   Review of Systems RLQ pain  Objective:   Vital Signs:  BP (!) 108/56 (BP Location: Right Arm)   Pulse 86   Temp 98.8 F (37.1 C) (Oral)   Resp 16   Ht 5\' 3"  (1.6 m)   Wt 69.9 kg   SpO2 94%   BMI 27.30 kg/m   Physical Exam: General: NAD, alert, chronically ill-appearing, laying in bed, grimacing with movement HENT: moist mucous membranes Cardiovascular: RRR Respiratory: no increased work of breathing noted, not in respiratory distress Neuro: awake, interactive Psych: appropriately answers all questions  Imaging: I personally reviewed recent imaging.   Assessment & Plan:   Assessment: Patient is an 82 year old female with a past medical history of metastatic ovarian cancer with peritoneal carcinomatosis as well as metastatic disease to lumbar and thoracic spine, degenerative disc disease with prior spinal fusion, CVA,  on 12/19/2022 from [facility] clinic for management of severe right lower quadrant pain. Imaging obtained on admission notes new small ascites in the abdomen and pelvis. Patient has known right ovarian mass with spine mets and prior compression fracture. Palliative medicine team consulted to assist with complex medical decision making.   Recommendations/Plan: # Complex medical decision making/goals of care:   - Only discussed pain  with patient today as this was her primary focus. Patient's son, Alexandria Mclaughlin, was at bedside though and wished to talk about prognosis. Expressed concern that once ovarian cancer has seeding to peritoneal carcinomatosis and progression on chemotherapy, likely has less than 6 months, possible closer to 3. Son acknowledged this and that essentially care at this point would be to focus on comfort as she has been suffering in pain and family doesn't want her to have to continue going through this level of pain. Acknowledged and noted would continue pain management adjustments.                  -  Code Status: Limited: Do not attempt resuscitation (DNR) -DNR-LIMITED -Do Not Intubate/DNI     # Symptom management                -Pain, in setting of metastatic ovarian cancer                              -Continue Tylenol scheduled 1000 mg every 8 hours during the day -Continue duloxetine 60 mg daily -Decrease gabapentin to 300 mg 2 times daily -Continue pregabalin 100 mg 3 times daily -Continue fentanyl patch to 50 mcg/h every 72 hours  -Start oral dilaudid 2mg  q4hrs prn  -Change IV Dilaudid to 0.5-1 mg every 3 hours as needed for breakthrough management after oral medications.    -Discontinue oxycodone                 -Anxiety                               -Continue home medication of clonazepam 5 mg nightly                  -Constipation                               -Continue MiraLAX 17 g twice daily                               -Continue senna 2 tabs daily                               -Give bisacodyl suppository today  # Psychosocial Support:  -Patient lives at home with son, Alexandria Mclaughlin.   # Discharge Planning: To Be Determined   Discussed with: patient, patient's son, RN, hospitalist   Thank you for allowing the palliative care team to participate in the care Alexandria Mclaughlin.  Alvester Morin, DO Palliative Care Provider PMT # [phone]  If patient remains symptomatic despite maximum doses,  please call PMT at [phone] between 0700 and 1900. Outside of these hours, please call attending, as PMT does not have night coverage.  *Please note that this is a verbal dictation therefore any spelling or grammatical  errors are due to the "[employer]agon [employer] One" system interpretation.

## 2022-12-21 NOTE — Care Management Obs Status (Signed)
MEDICARE OBSERVATION STATUS NOTIFICATION   Patient Details  Name: Alexandria Mclaughlin MRN: [id number] Date of Birth: 1940/02/27   Medicare Observation Status Notification Given:  Yes    Adrian Prows, RN 12/21/2022, 9:47 AM

## 2022-12-21 NOTE — Progress Notes (Signed)
PROGRESS NOTE  Alexandria Mclaughlin:[id number] DOB: [birth year]   PCP: Artis Delay, MD  Patient is from: Home  DOA: 12/19/2022 LOS: 0  Chief complaints Chief Complaint  Patient presents with   Abdominal Pain     Brief Narrative / Interim history: 82 year old F with PMH of metastatic ovarian cancer with peritoneal carcinomatosis, lumbar arthritis spine mets, DDD, prior spinal fusion, CVA and mood disorder presenting from [facility] due to progressive RLQ pain for few weeks and related ambulatory dysfunction due to pain, and admitted for pain control.  Palliative medicine following.  Subjective: Seen and examined earlier this morning.  No major events overnight of this morning.  No complaints.  She says she has no pain as long as she does not move.  Somewhat groggy.  Objective: Vitals:   12/20/22 2116 12/21/22 0512 12/21/22 1001 12/21/22 1003  BP: (!) 108/55 (!) 154/83 (!) 108/56   Pulse: 63 73 86   Resp: 15 18 16    Temp: 98 F (36.7 C) 98.7 F (37.1 C) 98.8 F (37.1 C)   TempSrc: Oral Oral Oral   SpO2: 90% 92% (!) 86% 94%  Weight:      Height:        Examination:  GENERAL: No apparent distress.  Nontoxic. HEENT: MMM.  Vision and hearing grossly intact.  NECK: Supple.  No apparent JVD.  RESP:  No IWOB.  Fair aeration bilaterally. CVS:  RRR. Heart sounds normal.  ABD/GI/GU: BS+. Abd soft.  RLQ tenderness. MSK/EXT:  Moves extremities. No apparent deformity. No edema.  SKIN: no apparent skin lesion or wound NEURO: Awake but not quite alert.  Fairly oriented.  No apparent focal neuro deficit. PSYCH: Calm. Normal affect.   Procedures:  None  Microbiology summarized: None  Assessment and plan: Principal Problem:   Ovarian cancer (HCC) Active Problems:   Cancer associated pain   High risk medication use   Anxiety   Constipation   Medication management   Palliative care encounter   Cancer-related breakthrough pain  Acute on chronic cancer-related abdominal  pain in patient with history of metastatic ovarian cancer: Uncontrolled on home regimen including fentanyl patch, gabapentin, Lyrica, Percocet and Cymbalta.  Some improvement with current regimen but still with significant abdominal pain -Appreciate help by palliative medicine-increased oxycodone, fentanyl patch and IV Dilaudid. -Continue gabapentin and Lyrica for radiculopathy.  Takes both at home. -Bowel regimen to counteract  Metastatic ovarian cancer with peritoneal carcinomatosis, thoracic and lumbar spine mets -Palliative/symptomatic management   Anemia of chronic disease/leukopenia: Likely due to malignancy.  No overt bleeding.  Some degree of iron deficiency on anemia panel.  Improved. Recent Labs    07/18/22 0804 08/04/22 1032 08/29/22 0855 09/26/22 0809 10/31/22 1236 11/13/22 1406 11/27/22 1118 12/19/22 1711 12/20/22 0657 12/21/22 0500  HGB 11.8* 10.8* 11.6* 11.6* 11.3* 9.9* 11.3* 9.2* 8.5* 9.6*  -Continue monitoring    Physical deconditioning: Typically ambulatory with use of a walker  -PT/OT   Chronic medical problems:  Hx stroke: continue home statin  Mood d/o: continue home duloxetine  Hyperthyroidism: Continue home methimazole  Body mass index is 27.3 kg/m.          DVT prophylaxis:  SCDs Start: 12/19/22 2227  Code Status: DNR/DNI Family Communication: None at bedside Level of care: Med-Surg Status is: Inpatient The patient will remain inpatient because: Intractable cancer-related abdominal pain   Final disposition: Likely home once pain fairly controlled Consultants:  Palliative medicine  35 minutes with more than 50% spent  in reviewing records, counseling patient/family and coordinating care.   Sch Meds:  Scheduled Meds:  acetaminophen  1,000 mg Oral Q8H   Chlorhexidine Gluconate Cloth  6 each Topical Daily   DULoxetine  60 mg Oral Daily   fentaNYL  1 patch Transdermal Q72H   gabapentin  300 mg Oral TID   irbesartan  150 mg Oral Daily    [START ON 12/22/2022] methimazole  5 mg Oral Once per day on Monday Wednesday Friday   pantoprazole  40 mg Oral Daily   polyethylene glycol  17 g Oral BID   pregabalin  100 mg Oral TID   senna  2 tablet Oral BID   traZODone  100 mg Oral QHS   Continuous Infusions: PRN Meds:.alum & mag hydroxide-simeth, bisacodyl, clonazePAM, HYDROmorphone (DILAUDID) injection, melatonin, ondansetron (ZOFRAN) IV, [DISCONTINUED] oxyCODONE **OR** oxyCODONE  Antimicrobials: Anti-infectives (From admission, onward)    None        I have personally reviewed the following labs and images: CBC: Recent Labs  Lab 12/19/22 1711 12/20/22 0657 12/21/22 0500  WBC 3.2* 2.9* 3.8*  NEUTROABS 1.8  --   --   HGB 9.2* 8.5* 9.6*  HCT 29.4* 27.9* 31.3*  MCV 101.0* 103.3* 102.6*  PLT 215 204 234   BMP &GFR Recent Labs  Lab 12/19/22 1711 12/20/22 0657 12/21/22 0500  NA 137 138 139  K 3.8 3.7 3.9  CL 101 101 101  CO2 27 29 28   GLUCOSE 109* 101* 115*  BUN 12 13 13   CREATININE 0.46 0.49 0.51  CALCIUM 9.2 8.8* 9.4  MG  --  1.9 2.0  PHOS  --  3.9 4.1   Estimated Creatinine Clearance: 50.8 mL/min (by C-G formula based on SCr of 0.51 mg/dL). Liver & Pancreas: Recent Labs  Lab 12/19/22 1711 12/21/22 0500  AST 13*  --   ALT 9  --   ALKPHOS 104  --   BILITOT 0.5  --   PROT 6.3*  --   ALBUMIN 2.9* 2.8*   Recent Labs  Lab 12/19/22 1711  LIPASE 21   No results for input(s): "AMMONIA" in the last 168 hours. Diabetic: No results for input(s): "HGBA1C" in the last 72 hours. No results for input(s): "GLUCAP" in the last 168 hours. Cardiac Enzymes: No results for input(s): "CKTOTAL", "CKMB", "CKMBINDEX", "TROPONINI" in the last 168 hours. No results for input(s): "PROBNP" in the last 8760 hours. Coagulation Profile: No results for input(s): "INR", "PROTIME" in the last 168 hours. Thyroid Function Tests: No results for input(s): "TSH", "T4TOTAL", "FREET4", "T3FREE", "THYROIDAB" in the last 72  hours. Lipid Profile: No results for input(s): "CHOL", "HDL", "LDLCALC", "TRIG", "CHOLHDL", "LDLDIRECT" in the last 72 hours. Anemia Panel: Recent Labs    12/20/22 0715  VITAMINB12 1,046*  FOLATE 8.6  FERRITIN 61  TIBC 216*  IRON 20*   Urine analysis:    Component Value Date/Time   COLORURINE YELLOW 12/19/2022 1738   APPEARANCEUR CLEAR 12/19/2022 1738   LABSPEC 1.018 12/19/2022 1738   PHURINE 5.0 12/19/2022 1738   GLUCOSEU NEGATIVE 12/19/2022 1738   HGBUR NEGATIVE 12/19/2022 1738   BILIRUBINUR NEGATIVE 12/19/2022 1738   KETONESUR NEGATIVE 12/19/2022 1738   PROTEINUR NEGATIVE 12/19/2022 1738   UROBILINOGEN 0.2 02/21/2009 1122   NITRITE NEGATIVE 12/19/2022 1738   LEUKOCYTESUR MODERATE (A) 12/19/2022 1738   Sepsis Labs: Invalid input(s): "PROCALCITONIN", "LACTICIDVEN"  Microbiology: No results found for this or any previous visit (from the past 240 hour(s)).  Radiology Studies: No results found.  Camary Sosa T. Hina Gupta [facility]  If 7PM-7AM, please contact night-coverage [email or web address] 12/21/2022, 12:48 PM

## 2022-12-21 NOTE — TOC Initial Note (Signed)
Transition of Care [facility]) - Initial/Assessment Note    Patient Details  Name: Alexandria Mclaughlin MRN: [id number] Date of Birth: [birth year]  Transition of Care [facility]) CM/SW Contact:    Adrian Prows, RN Phone Number: 12/21/2022, 9:52 AM  Clinical Narrative:                 TOC for d/c planning; spoke w/ pt in room; pt says she is from home; she plans to return at d/c; her POC is son Clovis Cao ([phone]); she verified she has insurance and pcp; pt says she has transportation; she denies SDOH risks; pt says she has bilateral HA, and rolator; she does not have home oxygen or HH services; she agrees to receive HHPT; she does not have an agency preference; spoke w/ Kandee Keen at [place]; he says agency can provide service; pt notified; contact info for agency placed in follow up provider section of d/c instructions.  Expected Discharge Plan: Home w Home Health Services Barriers to Discharge: Continued Medical Work up   Patient Goals and CMS Choice Patient states their goals for this hospitalization and ongoing recovery are:: home CMS Medicare.gov Compare Post Acute Care list provided to:: Patient        Expected Discharge Plan and Services   Discharge Planning Services: CM Consult Post Acute Care Choice: Home Health Living arrangements for the past 2 months: Apartment                           HH Arranged: PT HH Agency: [facility] Date [facility] Agency Contacted: 12/21/22 Time HH Agency Contacted: [phone] Representative spoke with at [facility] Agency: Kandee Keen  Prior Living Arrangements/Services Living arrangements for the past 2 months: Apartment Lives with:: Adult Children Patient language and need for interpreter reviewed:: Yes Do you feel safe going back to the place where you live?: Yes      Need for Family Participation in Patient Care: Yes (Comment) Care giver support system in place?: Yes (comment) Current home services: DME (walker) Criminal Activity/Legal Involvement  Pertinent to Current Situation/Hospitalization: No - Comment as needed  Activities of Daily Living   ADL Screening (condition at time of admission) Independently performs ADLs?: No Does the patient have a NEW difficulty with bathing/dressing/toileting/self-feeding that is expected to last >3 days?: No Does the patient have a NEW difficulty with getting in/out of bed, walking, or climbing stairs that is expected to last >3 days?: No Does the patient have a NEW difficulty with communication that is expected to last >3 days?: No Is the patient deaf or have difficulty hearing?: Yes Does the patient have difficulty seeing, even when wearing glasses/contacts?: No Does the patient have difficulty concentrating, remembering, or making decisions?: No  Permission Sought/Granted Permission sought to share information with : Case Manager Permission granted to share information with : Yes, Verbal Permission Granted  Share Information with NAME: Case manager     Permission granted to share info w Relationship: Clovis Cao (son) [phone]     Emotional Assessment Appearance:: Appears stated age Attitude/Demeanor/Rapport: Gracious Affect (typically observed): Accepting Orientation: : Oriented to Self, Oriented to Place, Oriented to  Time, Oriented to Situation Alcohol / Substance Use: Not Applicable Psych Involvement: No (comment)  Admission diagnosis:  Ovarian cancer (HCC) [C56.9] Intractable abdominal pain [R10.9] Patient Active Problem List   Diagnosis Date Noted   Cancer associated pain 12/20/2022   High risk medication use 12/20/2022   Anxiety 12/20/2022  Constipation 12/20/2022   Medication management 12/20/2022   Palliative care encounter 12/20/2022   Goals of care, counseling/discussion 10/17/2022   Anemia due to antineoplastic chemotherapy 08/29/2022   Genetic testing 07/07/2022   Pancytopenia, acquired (HCC) 06/27/2022   COVID-19 virus infection 06/09/2022   Drug-induced  hyperglycemia 05/30/2022   Ovarian cancer (HCC) 04/08/2022   Elevated tumor markers 04/08/2022   Peritoneal carcinomatosis (HCC) 04/08/2022   Mild cognitive impairment of uncertain or unknown etiology 01/24/2022   Multiple lacunar infarcts    Generalized anxiety disorder    Major depressive disorder 01/23/2022   GERD (gastroesophageal reflux disease) 01/23/2022   Right leg weakness    Acquired hallux rigidus of left foot 12/17/2020   Rotator cuff tendonitis, left 06/08/2018   Lumbar radiculopathy 04/26/2018   Lumbar radicular pain 07/29/2016   Myoclonus 09/19/2015   Therapeutic opioid induced constipation 07/25/2015   Essential hypertension 02/07/2015   HLD (hyperlipidemia) 02/07/2015   Low back pain 02/07/2015   BPPV (benign paroxysmal positional vertigo) 02/07/2015   Abnormality of gait 02/07/2015   Cerebrovascular disease    Lumbar facet arthropathy 07/05/2014   Acquired unequal leg length on left 08/08/2011   Lumbar post-laminectomy syndrome 06/11/2011   Sacroiliac joint dysfunction 06/11/2011   Subcortical infarction    PCP:  Artis Delay, MD Pharmacy:   [id number] [place], [place] - [address] [place] [address] Phone: [phone] Fax: [phone]  [facility] [address] [address] Phone: [phone] Fax: [phone]     Social Determinants of Health (SDOH) Social History: SDOH Screenings   Food Insecurity: No Food Insecurity (12/21/2022)  Housing: Patient Declined (12/21/2022)  Transportation Needs: No Transportation Needs (12/21/2022)  Utilities: Not At Risk (12/21/2022)  Depression (PHQ2-9): Low Risk  (11/19/2022)  Social Connections: Unknown (07/04/2021)   Received from [facility]  Tobacco Use: Medium Risk (12/20/2022)   SDOH Interventions: Food Insecurity Interventions: Intervention Not Indicated, Inpatient TOC Housing Interventions: Intervention Not  Indicated, Inpatient TOC Transportation Interventions: Intervention Not Indicated, Inpatient TOC Utilities Interventions: Intervention Not Indicated, Inpatient TOC   Readmission Risk Interventions     No data to display

## 2022-12-21 NOTE — Evaluation (Signed)
Occupational Therapy Evaluation Patient Details Name: Alexandria Mclaughlin MRN: [id number] DOB: 12-14-1940 Today's Date: 12/21/2022   History of Present Illness patient is a 82 y.o. female  who was brought in from clinic due to subacute on chronic progressive RLQ pain suspect related to her R sided ovarian mass.PMH: metastatic ovarian cancer with peritoneal carcinomatosis, mets to lumbar and thoracic spine, degenerative disc disease, prior spinal fusion, CVA, mood disorder,   Clinical Impression   Patient is a 82 year old female who was admitted for above. Patient was living at home with PRN support from son. Currently, patient is min A for transfers with max cues for sequencing to turn and sit during session. Patient was forgetful asking to go to the bathroom and then not remembering what we were doing upon entrance to bathroom. Nurse made aware. Patient would need 24/7 caregiver support in next level of care. Patient was noted to have a hard time with taking medications with nurse as OT was finished with session with nurse to message MD about SLP. Patient would continue to benefit from skilled OT services at this time while admitted and after d/c to address noted deficits in order to improve overall safety and independence in ADLs.         If plan is discharge home, recommend the following: A little help with walking and/or transfers;A lot of help with bathing/dressing/bathroom;Assistance with cooking/housework;Direct supervision/assist for medications management;Assist for transportation;Help with stairs or ramp for entrance;Direct supervision/assist for financial management    Functional Status Assessment  Patient has had a recent decline in their functional status and demonstrates the ability to make significant improvements in function in a reasonable and predictable amount of time.  Equipment Recommendations  None recommended by OT       Precautions / Restrictions Precautions Precautions:  Fall Precaution Comments: back precautions for comfort Restrictions Weight Bearing Restrictions: No      Mobility Bed Mobility Overal bed mobility: Needs Assistance     Sidelying to sit: Contact guard assist       General bed mobility comments: with education for log rolling technique. LLE noted to have tremor/muscle spasm like movement with bending          Balance Overall balance assessment: No apparent balance deficits (not formally assessed)             ADL either performed or assessed with clinical judgement   ADL Overall ADL's : Needs assistance/impaired Eating/Feeding: Minimal assistance;Bed level Eating/Feeding Details (indicate cue type and reason): did not eat lunch reporting that she keeps getting inturrpted Grooming: Wash/dry hands;Minimal assistance;Sitting   Upper Body Bathing: Minimal assistance;Sitting   Lower Body Bathing: Moderate assistance;Sit to/from stand;Sitting/lateral leans   Upper Body Dressing : Minimal assistance;Standing;Sitting   Lower Body Dressing: Maximal assistance;Sit to/from stand;Sitting/lateral leans   Toilet Transfer: Minimal assistance;Ambulation;Regular Teacher, adult education Details (indicate cue type and reason): with increased time. patient reporting needing to go to bathroom before grooming tasks and then forgetting where she was going once in bathroom. patient needed increased cues to process turns to commode and then to recliner in room with increased volume with patient HOH. Toileting- Clothing Manipulation and Hygiene: Maximal assistance;Sit to/from stand Toileting - Clothing Manipulation Details (indicate cue type and reason): with increased time.             Vision Baseline Vision/History: 1 Wears glasses              Pertinent Vitals/Pain Pain Assessment Pain Assessment: Faces Faces  Pain Scale: Hurts little more Pain Location: back and bottom Pain Descriptors / Indicators: Grimacing, Guarding Pain  Intervention(s): Limited activity within patient's tolerance, Monitored during session, Repositioned, Patient requesting pain meds-RN notified, RN gave pain meds during session     Extremity/Trunk Assessment Upper Extremity Assessment Upper Extremity Assessment: Right hand dominant;Generalized weakness   Lower Extremity Assessment Lower Extremity Assessment: Defer to PT evaluation (noted to have muscle spams like movement in RLE most but BLE)   Cervical / Trunk Assessment Cervical / Trunk Assessment: Kyphotic   Communication Communication Communication: Hearing impairment   Cognition Arousal: Alert Behavior During Therapy: WFL for tasks assessed/performed       General Comments: patient having hard time remembing where she is going during session. very HOH during session impacting cog assessment as well. patient needed consistent cues to process how to turn to toilet and recliner to be able to sit down.                Home Living Family/patient expects to be discharged to:: Private residence Living Arrangements: Children (son) Available Help at Discharge: Family;Available PRN/intermittently Type of Home: House Home Access: Elevator     Home Layout: One level               Home Equipment: Agricultural consultant (2 wheels)          Prior Functioning/Environment Prior Level of Function : Independent/Modified Independent               ADLs Comments: using RW        OT Problem List: Decreased activity tolerance;Impaired balance (sitting and/or standing);Decreased coordination;Decreased cognition;Decreased safety awareness;Decreased knowledge of use of DME or AE;Decreased knowledge of precautions;Cardiopulmonary status limiting activity;Pain      OT Treatment/Interventions: Self-care/ADL training;Therapeutic exercise;Energy conservation;Patient/family education;Cognitive remediation/compensation;Therapeutic activities;Balance training    OT Goals(Current goals  can be found in the care plan section) Acute Rehab OT Goals Patient Stated Goal: to go home OT Goal Formulation: Patient unable to participate in goal setting Time For Goal Achievement: 01/04/23 Potential to Achieve Goals: Fair  OT Frequency: Min 1X/week       AM-PAC OT "6 Clicks" Daily Activity     Outcome Measure Help from another person eating meals?: A Little Help from another person taking care of personal grooming?: A Little Help from another person toileting, which includes using toliet, bedpan, or urinal?: A Lot Help from another person bathing (including washing, rinsing, drying)?: A Lot Help from another person to put on and taking off regular upper body clothing?: A Little Help from another person to put on and taking off regular lower body clothing?: A Lot 6 Click Score: 15   End of Session Equipment Utilized During Treatment: Gait belt;Rolling walker (2 wheels) Nurse Communication: Mobility status;Patient requests pain meds  Activity Tolerance: Patient tolerated treatment well Patient left: in chair;with call bell/phone within reach;with chair alarm set  OT Visit Diagnosis: Unsteadiness on feet (R26.81);Muscle weakness (generalized) (M62.81);Pain                Time: 1353-1425 OT Time Calculation (min): 32 min Charges:  OT General Charges $OT Visit: 1 Visit OT Evaluation $OT Eval Low Complexity: 1 Low OT Treatments $Self Care/Home Management : 8-22 mins  Rosalio Loud, MS Acute Rehabilitation Department Office# [phone]   Selinda Flavin 12/21/2022, 4:01 PM

## 2022-12-22 DIAGNOSIS — Z515 Encounter for palliative care: Secondary | ICD-10-CM | POA: Diagnosis not present

## 2022-12-22 DIAGNOSIS — F419 Anxiety disorder, unspecified: Secondary | ICD-10-CM | POA: Diagnosis not present

## 2022-12-22 DIAGNOSIS — G893 Neoplasm related pain (acute) (chronic): Secondary | ICD-10-CM | POA: Diagnosis not present

## 2022-12-22 DIAGNOSIS — C561 Malignant neoplasm of right ovary: Secondary | ICD-10-CM | POA: Diagnosis not present

## 2022-12-22 DIAGNOSIS — Z7189 Other specified counseling: Secondary | ICD-10-CM

## 2022-12-22 MED ORDER — GABAPENTIN 300 MG PO CAPS: 300.0000 mg | ORAL_CAPSULE | Freq: Every day | ORAL | Status: DC

## 2022-12-22 MED ORDER — PREGABALIN 75 MG PO CAPS: 200.0000 mg | ORAL_CAPSULE | Freq: Three times a day (TID) | ORAL | Status: DC

## 2022-12-22 MED ORDER — HYDROMORPHONE HCL 1 MG/ML IJ SOLN
0.5000 mg | INTRAMUSCULAR | Status: DC | PRN
  Administered 2022-12-22 – 2022-12-24 (×5): 0.5 mg via INTRAVENOUS
  Filled 2022-12-22 (×6): qty 0.5

## 2022-12-22 MED ORDER — HYDROMORPHONE HCL 2 MG PO TABS: 3.0000 mg | ORAL_TABLET | ORAL | Status: DC | PRN

## 2022-12-22 MED ORDER — PREGABALIN 50 MG PO CAPS: 100.0000 mg | ORAL_CAPSULE | Freq: Three times a day (TID) | ORAL | Status: DC

## 2022-12-22 MED ORDER — PREGABALIN 75 MG PO CAPS
200.0000 mg | ORAL_CAPSULE | Freq: Two times a day (BID) | ORAL | Status: DC
  Administered 2022-12-22 – 2022-12-27 (×10): 200 mg via ORAL
  Filled 2022-12-22 (×10): qty 1

## 2022-12-22 MED ORDER — GABAPENTIN 300 MG PO CAPS
300.0000 mg | ORAL_CAPSULE | Freq: Every day | ORAL | Status: AC
  Administered 2022-12-23: 300 mg via ORAL
  Filled 2022-12-22: qty 1

## 2022-12-22 MED ORDER — HYDROMORPHONE HCL 4 MG PO TABS
4.0000 mg | ORAL_TABLET | ORAL | Status: DC | PRN
  Administered 2022-12-22 – 2022-12-26 (×19): 4 mg via ORAL
  Filled 2022-12-22 (×21): qty 1

## 2022-12-22 NOTE — Progress Notes (Signed)
PROGRESS NOTE    Alexandria Mclaughlin  EAV:[id number] DOB: February 29, 1940 DOA: 12/19/2022 PCP: Artis Delay, MD   Brief Narrative:  82 year old F with PMH of metastatic ovarian cancer with peritoneal carcinomatosis, lumbar arthritis spine mets, DDD, prior spinal fusion, CVA and mood disorder presenting from PM and R clinic due to progressive RLQ pain for few weeks and related ambulatory dysfunction due to pain, and admitted for pain control.  Palliative medicine following.  Assessment & Plan:   Principal Problem:   Ovarian cancer (HCC) Active Problems:   Cancer associated pain   High risk medication use   Anxiety   Constipation   Medication management   Palliative care encounter   Cancer-related breakthrough pain   Need for emotional support   Counseling and coordination of care   Diffuse intractable abdominal pain, uncontrolled Advancing metastatic ovarian cancer -Palliative care following, continue increased pain regimen as tolerated -Patient already on max dose Lyrica, continue to increase Dilaudid p.o. and IV as well as fentanyl patch. -Interventional radiology sidelined for possible plexus nerve block, follow-up with Dr. Elby Showers 12/23/2022    Acute metabolic versus toxic encephalopathy, likely polypharmacy  -Patient admits to occasional hallucinations overnight in the setting of increased narcotic use -Unfortunately patient's pain continues to be poorly controlled, would not wean narcotics at this time although would attempt to transition from IV to p.o. to decrease risk of hospital delirium/encephalopathy  Metastatic ovarian cancer with peritoneal carcinomatosis, thoracic and lumbar spine mets -Palliative/symptomatic management as above   Anemia of chronic disease/leukopenia:  In the setting of malignancy   Physical deconditioning/ambulatory dysfunction, acute on chronic:  -At baseline uses a walker, continues to decline given above. -High risk for falling given above narcotic  regimen   Hx stroke: continue home statin  Mood d/o: continue home duloxetine  Hyperthyroidism: Continue home methimazole   Body mass index is 27.3 kg/m.  DVT prophylaxis: SCDs Start: 12/19/22 2227 Code Status:   Code Status: Limited: Do not attempt resuscitation (DNR) -DNR-LIMITED -Do Not Intubate/DNI  Family Communication: None present  Status is: Inpatient  Dispo: The patient is from: Home              Anticipated d/c is to: To be determined (patient already noting she is unable to return home due to safety issues/unable to care for herself)              Anticipated d/c date is: 24 to 48 hours              Patient currently not medically stable for discharge given ongoing need for IV narcotics for pain control  Consultants:  Palliative care  Procedures:  None  Antimicrobials:  None  Subjective: No acute issues or events overnight denies nausea vomiting diarrhea constipation headache fevers chills or chest pain.  Abdominal pain ongoing, minimal improvement on current regimen.  Objective: Vitals:   12/21/22 1003 12/21/22 1359 12/21/22 2035 12/22/22 0537  BP:  96/60 (!) 97/52 119/66  Pulse:  77 72 91  Resp:  18 16 18   Temp:  98.2 F (36.8 C) 97.6 F (36.4 C) (!) 97.3 F (36.3 C)  TempSrc:  Oral Oral Oral  SpO2: 94% 98% 98% 99%  Weight:      Height:        Intake/Output Summary (Last 24 hours) at 12/22/2022 7829 Last data filed at 12/21/2022 1000 Gross per 24 hour  Intake 120 ml  Output --  Net 120 ml   [employer]   12/20/22  0056  Weight: 69.9 kg    Examination:  General:  Pleasantly resting in bed, No acute distress. HEENT:  Normocephalic atraumatic.  Sclerae nonicteric, noninjected.  Extraocular movements intact bilaterally. Neck:  Without mass or deformity.  Trachea is midline. Lungs:  Clear to auscultate bilaterally without rhonchi, wheeze, or rales. Heart:  Regular rate and rhythm.  Without murmurs, rubs, or gallops. Abdomen:  Soft, nontender,  nondistended.  Without guarding or rebound. Extremities: Without cyanosis, clubbing, edema, or obvious deformity. Skin:  Warm and dry, no erythema.   Data Reviewed: I have personally reviewed following labs and imaging studies  CBC: Recent Labs  Lab 12/19/22 1711 12/20/22 0657 12/21/22 0500  WBC 3.2* 2.9* 3.8*  NEUTROABS 1.8  --   --   HGB 9.2* 8.5* 9.6*  HCT 29.4* 27.9* 31.3*  MCV 101.0* 103.3* 102.6*  PLT 215 204 234   Basic Metabolic Panel: Recent Labs  Lab 12/19/22 1711 12/20/22 0657 12/21/22 0500  NA 137 138 139  K 3.8 3.7 3.9  CL 101 101 101  CO2 27 29 28   GLUCOSE 109* 101* 115*  BUN 12 13 13   CREATININE 0.46 0.49 0.51  CALCIUM 9.2 8.8* 9.4  MG  --  1.9 2.0  PHOS  --  3.9 4.1   GFR: Estimated Creatinine Clearance: 50.8 mL/min (by C-G formula based on SCr of 0.51 mg/dL). Liver Function Tests: Recent Labs  Lab 12/19/22 1711 12/21/22 0500  AST 13*  --   ALT 9  --   ALKPHOS 104  --   BILITOT 0.5  --   PROT 6.3*  --   ALBUMIN 2.9* 2.8*   Recent Labs  Lab 12/19/22 1711  LIPASE 21   Anemia Panel: Recent Labs    12/20/22 0715  VITAMINB12 1,046*  FOLATE 8.6  FERRITIN 61  TIBC 216*  IRON 20*   No results found for this or any previous visit (from the past 240 hour(s)).   Radiology Studies: No results found.  Scheduled Meds:  acetaminophen  1,000 mg Oral Q8H   Chlorhexidine Gluconate Cloth  6 each Topical Daily   DULoxetine  60 mg Oral Daily   fentaNYL  1 patch Transdermal Q72H   gabapentin  300 mg Oral BID   irbesartan  150 mg Oral Daily   methimazole  5 mg Oral Once per day on Monday Wednesday Friday   pantoprazole  40 mg Oral Daily   polyethylene glycol  17 g Oral BID   pregabalin  100 mg Oral TID   senna  2 tablet Oral BID   traZODone  100 mg Oral QHS   Continuous Infusions:   LOS: 1 day   Time spent:  Azucena Fallen, DO [facility]  If 7PM-7AM, please contact night-coverage [email or web address]  12/22/2022,  8:22 AM

## 2022-12-22 NOTE — Consult Note (Addendum)
Chief Complaint: Patient was seen in consultation today for intractable abdominal pain  Referring Physician(s): Carma Leaven, MD  Supervising Physician: Irish Lack  Patient Status: [facility] - In-pt  History of Present Illness: Alexandria Mclaughlin is a 82 y.o. female with BPPV, cerebrovascular disease, CAD, hypertension, generalized anxiety disorder, hyperlipidemia, history of stroke, and metastatic ovarian cancer being seen today in relation to severe, intractable abdominal pain felt to be secondary to ovarian cancer. Patient was admitted to [facility] on 12/19/22 for severe abdominal pain. Patient currently using fentanyl patches and oral percocet for pain control. IR has been consulted to evaluate patient for possible hypogastric plexus block.   Past Medical History:  Diagnosis Date   Abnormality of gait 02/07/2015   Acquired hallux rigidus of left foot 12/17/2020   Acquired unequal leg length on left 08/08/2011   AMS (altered mental status) 12/28/2020   Biceps tendonitis on left 08/18/2018   BPPV (benign paroxysmal positional vertigo) 02/07/2015   Bunion 12/17/2020   Cancer (HCC) 04/25/2022   Cerebrovascular disease    Cervical facet syndrome    Coronary artery disease 04/27/2022   Disorders of sacrum    Enthesopathy of hip region    Essential hypertension 02/07/2015   Generalized anxiety disorder    GERD (gastroesophageal reflux disease)    Headache    History of kidney stones    HLD (hyperlipidemia) 02/07/2015   Low back pain 02/07/2015   Lumbar facet arthropathy 07/05/2014   Lumbar post-laminectomy syndrome 06/11/2011   Lumbar radicular pain 07/29/2016   Lumbar radiculopathy 04/26/2018   Major depressive disorder    Mild cognitive impairment of uncertain or unknown etiology 01/24/2022   Multiple lacunar infarcts    MRI - bilateral basal ganglia and left thalamus   Myoclonus 09/19/2015   Rotator cuff tendonitis, left 06/08/2018   Sacroiliac joint dysfunction 06/11/2011    Sciatic neuritis    Stroke (HCC)    hx TIA   Subcortical infarction    2016 MRI - small chronic subcortical infarct with associated chronic hemosiderin deposition within the right superior frontal gyrus.   Synovitis and tenosynovitis    Therapeutic opioid induced constipation 07/25/2015   Thyroid disease    Transient left leg weakness    Vision abnormalities     Past Surgical History:  Procedure Laterality Date   ABDOMINAL HYSTERECTOMY     Due to fibroids   BACK SURGERY  2011   lumbar Fusion   BLADDER SURGERY  1985   CESAREAN SECTION     x2   EYE SURGERY Bilateral    Cataract surgery with IOL   FOOT SURGERY Right    "just scraped the bone"   IR IMAGING GUIDED PORT INSERTION  05/26/2022   LAPAROSCOPY N/A 04/08/2022   Procedure: LAPAROSCOPY DIAGNOSTIC WITH BIOPSIES;  Surgeon: Clide Cliff, MD;  Location: [facility] ORS;  Service: Gynecology;  Laterality: N/A;   RETINAL DETACHMENT SURGERY Right 10/2011   TRANSFORAMINAL LUMBAR INTERBODY FUSION W/ MIS 1 LEVEL Right 04/26/2018   Procedure: Right Lumbar four-five Minimally invasive Transforaminal lumbar interbody fusion;  Surgeon: Jadene Pierini, MD;  Location: [facility] OR;  Service: Neurosurgery;  Laterality: Right;    Allergies: Patient has no known allergies.  Medications: Prior to Admission medications   Medication Sig Start Date End Date Taking? Authorizing Provider  Cholecalciferol (VITAMIN D) 50 MCG (2000 UT) CAPS Take 2,000 Units by mouth daily.   Yes [provider]  clonazePAM (KLONOPIN) 0.5 MG tablet Take 1 tablet (0.5 mg total) by  mouth at bedtime as needed for anxiety. 11/21/22  Yes Raulkar, Drema Pry, MD  cyanocobalamin 1000 MCG tablet Take 1,000 mcg by mouth daily.   Yes [provider]  dexamethasone (DECADRON) 4 MG tablet Take 2 tabs at the night before and 2 tab the morning of chemotherapy, every 3 weeks, by mouth x 6 cycles 04/22/22  Yes Gorsuch, Ni, MD  DULoxetine (CYMBALTA) 60 MG capsule TAKE 1  CAPSULE BY MOUTH EVERY DAY Patient taking differently: Take 60 mg by mouth daily. 08/12/22  Yes Ranelle Oyster, MD  fentaNYL (DURAGESIC) 25 MCG/HR Place 1 patch onto the skin every 3 (three) days. 10/15/22 10/15/23 Yes Jones Bales, NP  gabapentin (NEURONTIN) 600 MG tablet TAKE 1 TABLET BY MOUTH FOUR TIMES A DAY 10/07/22  Yes Jones Bales, NP  irbesartan (AVAPRO) 150 MG tablet Take 150 mg by mouth daily.   Yes [provider]  lactulose (CHRONULAC) 10 GM/15ML solution TAKE 15 MLS (10 G TOTAL) BY MOUTH 3 (THREE) TIMES DAILY. 08/20/22  Yes Gorsuch, Ni, MD  lidocaine-prilocaine (EMLA) cream APPLY TO AFFECTED AREA ONCE AS DIRECTED Patient taking differently: Apply 1 Application topically See admin instructions. Apply to port before Chemo 12/15/22  Yes Gorsuch, Ni, MD  magnesium oxide (MAG-OX) 400 (240 Mg) MG tablet TAKE 1 TABLET BY MOUTH EVERY DAY Patient taking differently: Take 400 mg by mouth daily. 12/08/22  Yes Gorsuch, Paula Compton, MD  Melatonin 10 MG TABS Take 10 mg by mouth at bedtime as needed (sleep).   Yes [provider]  methimazole (TAPAZOLE) 5 MG tablet Take 5 mg by mouth See admin instructions. 3 times a week only   Yes [provider]  ondansetron (ZOFRAN) 8 MG tablet Take 1 tablet (8 mg total) by mouth every 8 (eight) hours as needed for nausea or vomiting. Start on the third day after carboplatin. 04/22/22  Yes Gorsuch, Ni, MD  oxyCODONE-acetaminophen (PERCOCET) 10-325 MG tablet Take 1 tablet by mouth 5 (five) times daily as needed for pain. 10/15/22 10/15/23 Yes Jones Bales, NP  polyethylene glycol (MIRALAX / GLYCOLAX) 17 g packet Take 17 g by mouth 2 (two) times daily.   Yes [provider]  pregabalin (LYRICA) 50 MG capsule Take 1 capsule (50 mg total) by mouth 3 (three) times daily. 11/19/22  Yes Ranelle Oyster, MD  prochlorperazine (COMPAZINE) 10 MG tablet TAKE 1 TABLET BY MOUTH EVERY 6 HOURS AS NEEDED FOR NAUSEA OR VOMITING. Patient taking  differently: Take 10 mg by mouth every 6 (six) hours as needed for nausea. 11/03/22  Yes Gorsuch, Ni, MD  rosuvastatin (CRESTOR) 40 MG tablet Take 1 tablet (40 mg total) by mouth at bedtime. 05/05/22  Yes Parke Poisson, MD  senna (SENOKOT) 8.6 MG tablet Take 2 tablets by mouth 2 (two) times daily.   Yes [provider]  traZODone (DESYREL) 100 MG tablet TAKE 1 TABLET BY MOUTH EVERYDAY AT BEDTIME Patient taking differently: Take 100 mg by mouth at bedtime. 09/15/22  Yes Jones Bales, NP     Family History  Problem Relation Age of Onset   Diabetes Mother    Heart disease Mother    Stroke Mother    Heart disease Father    Heart disease Brother    Colon cancer Neg Hx    Breast cancer Neg Hx    Ovarian cancer Neg Hx    Endometrial cancer Neg Hx    Pancreatic cancer Neg Hx    Prostate cancer Neg  Hx     Social History   Socioeconomic History   Marital status: Widowed    Spouse name: Not on file   Number of children: Not on file   Years of education: 14   Highest education level: Associate degree: academic program  Occupational History   Occupation: Retired  Tobacco Use   Smoking status: Former    Current packs/day: 0.00    Average packs/day: 0.3 packs/day for 15.0 years (3.8 ttl pk-yrs)    Types: Cigarettes    Start date: 03/15/1953    Quit date: 03/15/1968    Years since quitting: 54.8   Smokeless tobacco: Never  Vaping Use   Vaping status: Never Used  Substance and Sexual Activity   Alcohol use: Not Currently    Comment: occasional glass of wine   Drug use: Yes    Frequency: 20.0 times per week    Types: Fentanyl, Hydrocodone   Sexual activity: Not Currently  Other Topics Concern   Not on file  Social History Narrative   Right handed   Drinks caffeine   Condo two story with Engineer, structural   Social Determinants of Health   Financial Resource Strain: Not on file  Food Insecurity: No Food Insecurity (12/21/2022)   Hunger Vital Sign    Worried About Running  Out of Food in the Last Year: Never true    Ran Out of Food in the Last Year: Never true  Transportation Needs: No Transportation Needs (12/21/2022)   PRAPARE - Administrator, Civil Service (Medical): No    Lack of Transportation (Non-Medical): No  Physical Activity: Not on file  Stress: Not on file  Social Connections: Unknown (07/04/2021)   Received from [facility]   Social Network    Social Network: Not on file   Code Status: Full code  Review of Systems: A 12 point ROS discussed and pertinent positives are indicated in the HPI above.  All other systems are negative.  Review of Systems  Constitutional:  Negative for chills and fever.  Respiratory:  Negative for chest tightness and shortness of breath.   Cardiovascular:  Negative for chest pain and leg swelling.  Gastrointestinal:  Positive for abdominal pain and constipation. Negative for diarrhea, nausea and vomiting.  Neurological:  Positive for headaches. Negative for dizziness.  Psychiatric/Behavioral:  Negative for confusion.     Vital Signs: BP 127/67 (BP Location: Left Arm)   Pulse 83   Temp 99.1 F (37.3 C) (Oral)   Resp 18   Ht 5\' 3"  (1.6 m)   Wt 154 lb 1.6 oz (69.9 kg)   SpO2 94%   BMI 27.30 kg/m    Physical Exam Vitals reviewed.  Constitutional:      General: She is not in acute distress.    Appearance: She is ill-appearing.  Cardiovascular:     Rate and Rhythm: Normal rate and regular rhythm.     Pulses: Normal pulses.     Heart sounds: Normal heart sounds.  Pulmonary:     Effort: Pulmonary effort is normal.     Breath sounds: Normal breath sounds.  Abdominal:     Palpations: Abdomen is soft.     Tenderness: There is abdominal tenderness.     Comments: Patient's abdomen is diffusely tender  Musculoskeletal:     Right lower leg: No edema.     Left lower leg: No edema.  Skin:    General: Skin is warm and dry.  Neurological:  Mental Status: She is alert and  oriented to person, place, and time.  Psychiatric:        Mood and Affect: Mood normal.        Behavior: Behavior normal.        Thought Content: Thought content normal.        Judgment: Judgment normal.     Imaging: CT T-SPINE NO CHARGE  Result Date: 12/20/2022 CLINICAL DATA:  Study identified as missing a report at 4:13 am on 12/20/2022. 82 year old female with abdomen and spine pain.  Ovarian cancer. EXAM: CT THORACIC SPINE WITH CONTRAST TECHNIQUE: Multiplanar CT images of the thoracic spine were reconstructed from contemporary CT of the Abdomen. RADIATION DOSE REDUCTION: This exam was performed according to the departmental dose-optimization program which includes automated exposure control, adjustment of the mA and/or kV according to patient size and/or use of iterative reconstruction technique. CONTRAST:  No additional COMPARISON:  CT Abdomen and Pelvis, lumbar spine CT from the same time reported separately. Restaging CT Chest, Abdomen, and Pelvis 10/15/2022. Outside thoracic spine MRI 02/22/2021. FINDINGS: Limited cervical spine imaging: Cervicothoracic junction alignment is within normal limits. Thoracic spine segmentation:  Normal. Alignment: Stable thoracic kyphosis. No significant scoliosis or spondylolisthesis. Vertebrae: Mild chronic superior endplate deformities at T2, T4, T6, T9 are all stable since the 2022 MRI. Background bone mineralization is stable. Small round sclerotic bone lesions in T9 and T10 appear new from the 2022 MRI and are both mildly larger since August (now up to 8 mm versus 4-5 mm previously). Smaller similar T5 and T6 inferior endplate sclerotic foci have also progressed Increased. No destructive osseous lesion in the thoracic spine. No acute pathologic fracture. Visible ribs appear intact. Lumbar spine is detailed separately. Paraspinal and other soft tissues: Stable right chest Port-A-Cath. Calcified aortic atherosclerosis. Calcified coronary artery atherosclerosis.  No pericardial effusion. Lower lung volumes. Small new layering left and trace right pleural effusions. Dependent and lung base atelectasis. Major airways remain patent. Abdomen is reported separately. Thoracic paraspinal soft tissues remain normal. Disc levels: Mild for age thoracic spine degeneration superimposed on the above findings. Degenerative appearing posterior element ankylosis in the upper thoracic spine at least T3 through T6. No CT evidence of thoracic spinal stenosis. IMPRESSION: 1. Small but progressive sclerotic bone metastases in thoracic vertebrae since August restaging CT. 2. No associated pathologic fracture. Multilevel mild chronic compression fractures in the thoracic spine are stable since 2022. 3. New small pleural effusions, pulmonary atelectasis. 4. Abdomen and Lumbar spine are detailed separately. Electronically Signed   By: Odessa Fleming M.D.   On: 12/20/2022 04:23   CT L-SPINE NO CHARGE  Result Date: 12/19/2022 CLINICAL DATA:  Back pain, lower extremity radicular pain EXAM: CT LUMBAR SPINE WITHOUT CONTRAST TECHNIQUE: Multidetector CT imaging of the lumbar spine was performed without intravenous contrast administration. Multiplanar CT image reconstructions were also generated. RADIATION DOSE REDUCTION: This exam was performed according to the departmental dose-optimization program which includes automated exposure control, adjustment of the mA and/or kV according to patient size and/or use of iterative reconstruction technique. COMPARISON:  None Available. FINDINGS: Segmentation: 5 lumbar type vertebrae. Alignment: Normal. Vertebrae: L4-5 anterior posterior lumbar fusion with instrumentation again noted. Degenerative ankylosis of the anterior posterior elements of L5-S1 noted. Chronic L1 compression deformity with approximately 50% loss of height is unchanged. Sclerotic foci within the L2 and L5 vertebral bodies is again identified suspicious for sclerotic osseous metastases as these are  stable since prior examination, but are new since remote prior  examination of 03/17/2022. No acute fracture. Paraspinal and other soft tissues: Negative paraspinal soft tissues. Mass within the right hemipelvis is better assessed on accompanying CT examination of the abdomen and pelvis. Disc levels: Intervertebral disc space narrowing and endplate remodeling at L3-4 in keeping with changes of advanced degenerative disc disease. Remaining intervertebral disc heights are preserved. Eccentric left paracentral/foraminal disc bulge at L3-4 noted. Facet hypertrophy results in mild left and moderate right neuroforaminal narrowing at this level. Streak artifact limits evaluation of the neural foramina and spinal canal at L4-5. Bilateral laminectomy and posterior decompression of L5 has been performed. No significant neuroforaminal narrowing or central canal stenosis at this level. IMPRESSION: 1. No acute fracture or listhesis of the lumbar spine. 2. Stable chronic L1 compression deformity with approximately 50% loss of height. 3. Stable sclerotic foci within the L2 and L5 vertebral bodies suspicious for sclerotic osseous metastases as these are stable since prior examination, but are new since remote prior examination of 03/17/2022. 4. L4-5 anterior posterior lumbar fusion with instrumentation. Degenerative ankylosis of the anterior posterior elements of L5-S1. 5. Advanced degenerative disease at L3-4 resulting in mild left and moderate right neuroforaminal narrowing at this level. Electronically Signed   By: Helyn Numbers M.D.   On: 12/19/2022 20:39   CT ABDOMEN PELVIS W CONTRAST  Result Date: 12/19/2022 CLINICAL DATA:  Abdominal pain EXAM: CT ABDOMEN AND PELVIS WITH CONTRAST TECHNIQUE: Multidetector CT imaging of the abdomen and pelvis was performed using the standard protocol following bolus administration of intravenous contrast. RADIATION DOSE REDUCTION: This exam was performed according to the departmental  dose-optimization program which includes automated exposure control, adjustment of the mA and/or kV according to patient size and/or use of iterative reconstruction technique. CONTRAST:  OMNIPAQUE IOHEXOL 300 MG/ML  SOLN COMPARISON:  10/15/2022 FINDINGS: Lower chest: Trace left pleural effusion. Dependent atelectasis. Coronary artery and aortic atherosclerosis. Hepatobiliary: No focal hepatic abnormality. Gallbladder unremarkable. Pancreas: No focal abnormality or ductal dilatation. Spleen: No focal abnormality.  Normal size. Adrenals/Urinary Tract: No adrenal abnormality. No focal renal abnormality. No stones or hydronephrosis. Urinary bladder is unremarkable. Stomach/Bowel: Stomach, large and small bowel grossly unremarkable. Vascular/Lymphatic: Aortic atherosclerosis. No evidence of aneurysm or adenopathy. Reproductive: Prior hysterectomy. Mixed solid and cystic mass in the right ovary again measuring 3.9 x 3.4 cm, compared to 3.9 x 3.5 cm previously, unchanged. Other: Small amount of free fluid adjacent to the liver, spleen and in the pelvis. This is new since prior study. Musculoskeletal: Sclerotic area in the posterior L2 vertebral body, 7 mm compared to 6 mm previously. 1.5 cm sclerotic posterior L5 lesion, stable. Chronic moderate L1 compression deformity, stable. Sclerotic lesions in the T9 and T10 vertebral body, similar to prior study. IMPRESSION: Stable left ovarian mass in this patient with known ovarian cancer. Essentially stable sclerotic foci in the thoracic and lumbar spine. Stable moderate compression fracture at L1. Small amount of ascites in the abdomen and pelvis, new since prior study. Trace left pleural effusion. Dependent atelectasis in the lower lobes. Aortic atherosclerosis.  Coronary artery disease. Electronically Signed   By: Charlett Nose M.D.   On: 12/19/2022 20:15   US Pelvis Complete  Result Date: 12/19/2022 CLINICAL DATA:  Right lower quadrant pain history of ovarian cancer  EXAM: TRANSABDOMINAL ULTRASOUND OF PELVIS DOPPLER ULTRASOUND OF OVARIES TECHNIQUE: Transabdominal ultrasound examination of the pelvis was performed including evaluation of the uterus, ovaries, adnexal regions, and pelvic cul-de-sac. Color and duplex Doppler ultrasound was utilized to evaluate blood flow to  the ovaries. COMPARISON:  CT 10/15/2022 FINDINGS: Uterus Hysterectomy Endometrium Hysterectomy Right ovary Measurements: 2.5 x 2.2 x 2.7 cm = volume: 8.9 mL. Cystic and soft tissue appearance of the right ovary presumably corresponding to the right adnexal mass seen on multiple prior CT imaging. This measures 3.8 x 3.9 x 4 cm. Left ovary Oophorectomy Pulsed Doppler evaluation demonstrates normal low-resistance arterial and venous waveforms in the right ovary and associated mass Other: Small volume free fluid in the pelvis. IMPRESSION: 1. Status post hysterectomy and left oophorectomy. 2. 4 cm cystic and soft tissue appearance of the right ovary presumably corresponding to the given history and right adnexal mass seen on multiple prior CT imaging. No convincing evidence for torsion on this exam. 3. Small volume free fluid in the pelvis. Electronically Signed   By: Jasmine Pang M.D.   On: 12/19/2022 19:17   [place] Art/Ven Flow Abd Pelv Doppler  Result Date: 12/19/2022 CLINICAL DATA:  Right lower quadrant pain history of ovarian cancer EXAM: TRANSABDOMINAL ULTRASOUND OF PELVIS DOPPLER ULTRASOUND OF OVARIES TECHNIQUE: Transabdominal ultrasound examination of the pelvis was performed including evaluation of the uterus, ovaries, adnexal regions, and pelvic cul-de-sac. Color and duplex Doppler ultrasound was utilized to evaluate blood flow to the ovaries. COMPARISON:  CT 10/15/2022 FINDINGS: Uterus Hysterectomy Endometrium Hysterectomy Right ovary Measurements: 2.5 x 2.2 x 2.7 cm = volume: 8.9 mL. Cystic and soft tissue appearance of the right ovary presumably corresponding to the right adnexal mass seen on multiple  prior CT imaging. This measures 3.8 x 3.9 x 4 cm. Left ovary Oophorectomy Pulsed Doppler evaluation demonstrates normal low-resistance arterial and venous waveforms in the right ovary and associated mass Other: Small volume free fluid in the pelvis. IMPRESSION: 1. Status post hysterectomy and left oophorectomy. 2. 4 cm cystic and soft tissue appearance of the right ovary presumably corresponding to the given history and right adnexal mass seen on multiple prior CT imaging. No convincing evidence for torsion on this exam. 3. Small volume free fluid in the pelvis. Electronically Signed   By: Jasmine Pang M.D.   On: 12/19/2022 19:17    Labs:  CBC: Recent Labs    11/27/22 1118 12/19/22 1711 12/20/22 0657 12/21/22 0500  WBC 3.0* 3.2* 2.9* 3.8*  HGB 11.3* 9.2* 8.5* 9.6*  HCT 35.5* 29.4* 27.9* 31.3*  PLT 235 215 204 234    COAGS: No results for input(s): "INR", "APTT" in the last 8760 hours.  BMP: Recent Labs    11/27/22 1118 12/19/22 1711 12/20/22 0657 12/21/22 0500  NA 140 137 138 139  K 3.8 3.8 3.7 3.9  CL 105 101 101 101  CO2 28 27 29 28   GLUCOSE 140* 109* 101* 115*  BUN 18 12 13 13   CALCIUM 10.0 9.2 8.8* 9.4  CREATININE 0.53 0.46 0.49 0.51  GFRNONAA >60 >60 >60 >60    LIVER FUNCTION TESTS: Recent Labs    10/31/22 1236 11/13/22 1406 11/27/22 1118 12/19/22 1711 12/21/22 0500  BILITOT 0.7 0.5 0.5 0.5  --   AST 16 13* 12* 13*  --   ALT 11 8 6 9   --   ALKPHOS 100 93 106 104  --   PROT 6.9 6.0* 7.0 6.3*  --   ALBUMIN 4.1 3.4* 3.9 2.9* 2.8*    TUMOR MARKERS: Recent Labs    03/18/22 1559  CEA 2.40    Assessment and Plan:  Alexandria Mclaughlin is an 82 yo female being seen today in relation to intractable abdominal pain secondary  to metastatic ovarian cancer. Patient is being followed by Palliative team. IR has been consulted to evaluate for hypogastric plexus block for pain relief. Case was reviewed by Dr Elby Showers and approved for image-guided hypogastric plexus block with  moderate sedation tentatively for 12/23/22. Patient to be NPO at midnight tonight, pre-procedural labs have been ordered.  Risks and benefits of image-guided hypogastric plexus block was discussed with the patient and/or patient's family including, but not limited to bleeding, infection, or damage to adjacent structures.  All of the questions were answered and there is agreement to proceed.  Consent signed and in chart.   Thank you for this interesting consult.  I greatly enjoyed meeting JENNAE HOEK and look forward to participating in their care.  A copy of this report was sent to the requesting provider on this date.  Electronically Signed: Kennieth Francois, PA-C 12/22/2022, 3:33 PM   I spent a total of 20 Minutes in face to face in clinical consultation, greater than 50% of which was counseling/coordinating care for intractable pain.

## 2022-12-22 NOTE — Progress Notes (Signed)
Daily Progress Note   Patient Name: Alexandria Mclaughlin       Date: 12/22/2022 DOB: 1940/07/29  Age: 82 y.o. MRN#: [id number] Attending Physician: Azucena Fallen, MD Primary Care Physician: Artis Delay, MD Admit Date: 12/19/2022 Length of Stay: 1 day  Reason for Consultation/Follow-up: Pain control  Subjective:   CC: Patient notes pain not controlled. Following up regarding complex medical decision making.   Subjective:  Reviewed EMR prior to presenting to bedside.  At time of EMR review patient had received an IV Dilaudid 1 mg x 3 doses and 0.5mg  x 1 dose, and po dilaudid 2mg  as needed x 2 doses.  Patient's fentanyl patch was increased to 50 mcg/h every 72 hours on 12/20/2022. Discussed care with RN and hosptialist for updates.  Presented to bedside to see patient. Patient noted she is still having RLQ pain. Does not feel oral dilaudid dose helped her. Noted could increase dose to 4mg  q3hrs prn to try. Patient's speak is more articulate today though patient admits she can become confused and hallucinate. Again inquired about her goals for medical care moving forward and she states she wants to be able to work with PT to be considered for rehab and then return home where she lives with her son. Talked about full comfort focused care and she again notes desire to work with PT at this point. Discussed balancing pain medications with sleepiness/confusion and patient clearly stated that when it comes to pain, she would rather be confused and sleepy then in pain. Acknowledged this and noted continued to adjust medication to find balance. Patient then noted she wanted to stand to use the bathroom so was able to ask RN and NT for assistance. Excused myself at that time and noted PMT would continue to follow along.   Review of Systems RLQ pain  Objective:   Vital Signs:  BP 119/66 (BP Location: Right Arm)   Pulse 91   Temp (!) 97.3 F (36.3 C) (Oral)   Resp 18   Ht 5\' 3"  (1.6 m)   Wt 69.9 kg    SpO2 99%   BMI 27.30 kg/m   Physical Exam: General: NAD, alert, chronically ill-appearing, laying in bed HENT: moist mucous membranes Cardiovascular: RRR Respiratory: no increased work of breathing noted, not in respiratory distress Neuro: awake, interactive; admits to having hallucinations at night and taking time to orient herself in AM Psych: appropriately answers all questions  Imaging: I personally reviewed recent imaging.   Assessment & Plan:   Assessment: Patient is an 82 year old female with a past medical history of metastatic ovarian cancer with peritoneal carcinomatosis as well as metastatic disease to lumbar and thoracic spine, degenerative disc disease with prior spinal fusion, CVA, on 12/19/2022 from [facility] clinic for management of severe right lower quadrant pain. Imaging obtained on admission notes new small ascites in the abdomen and pelvis. Patient has known right ovarian mass with spine mets and prior compression fracture. Palliative medicine team consulted to assist with complex medical decision making.   Recommendations/Plan: # Complex medical decision making/goals of care:   - Patient again stating today that she would like to participate with PT as much as able to potentially go to rehab since she was fairly functional at home before this worsening of pain. Does also note that she would rather be confused or sleepy instead of in the amount of pain she has had. Acknowledged wishes regarding this. -Had spoken to son, [name], on 10/27 was at bedside and then  he had wished to talk about prognosis. Had expressed concern that once ovarian cancer has seeding to peritoneal carcinomatosis and progression on chemotherapy, likely has less than 6 months. Son acknowledged this and that essentially care at this point would be to focus on comfort as she has been suffering in pain and family doesn't want her to have to continue going through this level of pain. Acknowledged and noted  would continue pain management adjustments.                  -  Code Status: Limited: Do not attempt resuscitation (DNR) -DNR-LIMITED -Do Not Intubate/DNI     # Symptom management                -Pain, in setting of metastatic ovarian cancer                              -Continue Tylenol scheduled 1000 mg every 8 hours during the day -Continue duloxetine 60 mg daily -Decrease gabapentin to 300 mg to once daily tomorrow as titrating off when plan to stop after tomorrows dose since Lyrica increase.  -Continue pregabalin 200 mg 2 times daily; hospitalist adjusted on 10/28 -Continue fentanyl patch to 50 mcg/h every 72 hours  -Increase oral dilaudid to 4mg  q3hrs prn  -Change IV Dilaudid to 0.5 mg every 3 hours as needed for breakthrough management after oral medications.   -Discussed with hosptialist who is going to reach out to IR about potentially hypogastric plexus nerve block for symptom management. Think it is appropraite to pursue nerve block to minimize Aes of medications.                   -Anxiety                               -Continue home medication of clonazepam 5 mg nightly                  -Constipation                               -Continue MiraLAX 17 g twice daily                               -Continue senna 2 tabs daily                               -Continue bisacodyl suppository prn   -In note having BM, would recommend enema   # Psychosocial Support:  -Patient lives at home with son, [name].   # Discharge Planning: To Be Determined   Discussed with: patient, RN, hospitalist   Thank you for allowing the palliative care team to participate in the care [name].  Alvester Morin, DO Palliative Care Provider PMT # [phone]  If patient remains symptomatic despite maximum doses, please call PMT at [phone] between 0700 and 1900. Outside of these hours, please call attending, as PMT does not have night coverage.  *Please note that this is a verbal dictation  therefore any spelling or grammatical errors are due to the "[employer]agon [employer] One" system interpretation.

## 2022-12-23 ENCOUNTER — Inpatient Hospital Stay (HOSPITAL_COMMUNITY): Payer: Medicare HMO

## 2022-12-23 DIAGNOSIS — R109 Unspecified abdominal pain: Secondary | ICD-10-CM

## 2022-12-23 DIAGNOSIS — Z515 Encounter for palliative care: Secondary | ICD-10-CM | POA: Diagnosis not present

## 2022-12-23 DIAGNOSIS — C561 Malignant neoplasm of right ovary: Secondary | ICD-10-CM | POA: Diagnosis not present

## 2022-12-23 DIAGNOSIS — Z7189 Other specified counseling: Secondary | ICD-10-CM | POA: Diagnosis not present

## 2022-12-23 DIAGNOSIS — G893 Neoplasm related pain (acute) (chronic): Secondary | ICD-10-CM | POA: Diagnosis not present

## 2022-12-23 DIAGNOSIS — F419 Anxiety disorder, unspecified: Secondary | ICD-10-CM | POA: Diagnosis not present

## 2022-12-23 HISTORY — PX: IR BLOCK INJ HYPOGASTRIC PLEXUS: IMG932

## 2022-12-23 LAB — CBC WITH DIFFERENTIAL/PLATELET
Abs Immature Granulocytes: 0.01 10*3/uL (ref 0.00–0.07)
Basophils Absolute: 0 10*3/uL (ref 0.0–0.1)
Basophils Relative: 0 %
Eosinophils Absolute: 0 10*3/uL (ref 0.0–0.5)
Eosinophils Relative: 0 %
HCT: 30.7 % — ABNORMAL LOW (ref 36.0–46.0)
Hemoglobin: 9.4 g/dL — ABNORMAL LOW (ref 12.0–15.0)
Immature Granulocytes: 0 %
Lymphocytes Relative: 28 %
Lymphs Abs: 1.2 10*3/uL (ref 0.7–4.0)
MCH: 30.9 pg (ref 26.0–34.0)
MCHC: 30.6 g/dL (ref 30.0–36.0)
MCV: 101 fL — ABNORMAL HIGH (ref 80.0–100.0)
Monocytes Absolute: 0.9 10*3/uL (ref 0.1–1.0)
Monocytes Relative: 20 %
Neutro Abs: 2.3 10*3/uL (ref 1.7–7.7)
Neutrophils Relative %: 52 %
Platelets: 254 10*3/uL (ref 150–400)
RBC: 3.04 MIL/uL — ABNORMAL LOW (ref 3.87–5.11)
RDW: 14.3 % (ref 11.5–15.5)
WBC: 4.5 10*3/uL (ref 4.0–10.5)
nRBC: 0 % (ref 0.0–0.2)

## 2022-12-23 LAB — PROTIME-INR
INR: 1.1 (ref 0.8–1.2)
Prothrombin Time: 13.9 s (ref 11.4–15.2)

## 2022-12-23 MED ORDER — MIDAZOLAM HCL 2 MG/2ML IJ SOLN
INTRAMUSCULAR | Status: AC
  Filled 2022-12-23: qty 2

## 2022-12-23 MED ORDER — METHYLPREDNISOLONE ACETATE 80 MG/ML IJ SUSP
80.0000 mg | Freq: Once | INTRAMUSCULAR | Status: AC
  Administered 2022-12-23: 80 mg

## 2022-12-23 MED ORDER — BUPIVACAINE HCL (PF) 0.5 % IJ SOLN
30.0000 mL | Freq: Once | INTRAMUSCULAR | Status: AC
  Administered 2022-12-23: 7 mL

## 2022-12-23 MED ORDER — BUPIVACAINE HCL (PF) 0.5 % IJ SOLN
INTRAMUSCULAR | Status: AC
  Filled 2022-12-23: qty 30

## 2022-12-23 MED ORDER — LIDOCAINE HCL 1 % IJ SOLN
20.0000 mL | Freq: Once | INTRAMUSCULAR | Status: AC
  Administered 2022-12-23: 3 mL via INTRADERMAL

## 2022-12-23 MED ORDER — METHYLPREDNISOLONE SODIUM SUCC 125 MG IJ SOLR
INTRAMUSCULAR | Status: AC
  Filled 2022-12-23: qty 2

## 2022-12-23 MED ORDER — IOHEXOL 300 MG/ML  SOLN
50.0000 mL | Freq: Once | INTRAMUSCULAR | Status: AC | PRN
  Administered 2022-12-23: 2 mL via INTRATHECAL

## 2022-12-23 MED ORDER — FENTANYL CITRATE (PF) 100 MCG/2ML IJ SOLN
INTRAMUSCULAR | Status: AC | PRN
  Administered 2022-12-23: 50 ug via INTRAVENOUS

## 2022-12-23 MED ORDER — FENTANYL 75 MCG/HR TD PT72
1.0000 | MEDICATED_PATCH | TRANSDERMAL | Status: DC
  Administered 2022-12-23 – 2022-12-26 (×2): 1 via TRANSDERMAL
  Filled 2022-12-23 (×2): qty 1

## 2022-12-23 MED ORDER — MIDAZOLAM HCL 2 MG/2ML IJ SOLN
INTRAMUSCULAR | Status: AC | PRN
Start: 2022-12-23 — End: 2022-12-23
  Administered 2022-12-23: 1 mg via INTRAVENOUS

## 2022-12-23 MED ORDER — LIDOCAINE HCL 1 % IJ SOLN
INTRAMUSCULAR | Status: AC
  Filled 2022-12-23: qty 20

## 2022-12-23 MED ORDER — FENTANYL CITRATE (PF) 100 MCG/2ML IJ SOLN
INTRAMUSCULAR | Status: AC
  Filled 2022-12-23: qty 2

## 2022-12-23 MED ORDER — METHYLPREDNISOLONE ACETATE 80 MG/ML IJ SUSP
INTRAMUSCULAR | Status: AC
  Filled 2022-12-23: qty 1

## 2022-12-23 NOTE — Progress Notes (Addendum)
Physical Therapy Treatment Patient Details Name: Alexandria Mclaughlin MRN: [id number] DOB: [birth year] Today's Date: 12/23/2022   History of Present Illness patient is a 82 y.o. female  who was brought in from clinic due to subacute on chronic progressive RLQ pain suspect related to her R sided ovarian mass.PMH: metastatic ovarian cancer with peritoneal carcinomatosis, mets to lumbar and thoracic spine, degenerative disc disease, prior spinal fusion, CVA, mood disorder,    PT Comments  Pt admitted with above diagnosis.  Pt currently with functional limitations due to the deficits listed below (see PT Problem List). Pt in bed when PT arrived, nurse entered room shortly there after to provide pain medication and pain patch. Pt reported the pain medication was doing nothing and she needs some morphine and "weed".  Pt remained 9/10 back and abdominal pain during therapy session.  Pt required mod A for supine <> sit, min A for sit to stand  from EOB and CGA and cues for sit to stand  from standard commode. Gait 30 feet with RW, CGA, flexed posture and short stride length with slow cadence. Pt declined to sit in recliner. Pt is awaiting procedure and elected to return to bed. Pt is NPO. Pt d/c plan may need to be re-assessed following procedure with pt requiring increased physical assist today.  Later in the day social worker made PT aware that pt no longer feels safe transitioning home with [facility] services and feels she needs more assist and intensive therapy at time of d/c from hospital. PT recommends a change in pt d/c plan and patient will benefit from continued inpatient follow up therapy, <3 hours/day.  Pt will benefit from acute skilled PT to increase their independence and safety with mobility to allow discharge.      If plan is discharge home, recommend the following: A little help with walking and/or transfers;A little help with bathing/dressing/bathroom;Assistance with cooking/housework;Assist for  transportation;Help with stairs or ramp for entrance   Can travel by private vehicle        Equipment Recommendations  None recommended by PT    Recommendations for Other Services       Precautions / Restrictions Precautions Precautions: Fall Precaution Comments: back precautions for comfort Restrictions Weight Bearing Restrictions: No     Mobility  Bed Mobility Overal bed mobility: Needs Assistance Bed Mobility: Supine to Sit, Sit to Supine     Supine to sit: Mod assist, HOB elevated, Used rails Sit to supine: Mod assist (flat)   General bed mobility comments: with education for log rolling technique and mod A for trunk for supine to sit, mod A for sit to supine for B LEs pt is limited due to pain    Transfers Overall transfer level: Needs assistance Equipment used: Rolling walker (2 wheels) Transfers: Sit to/from Stand Sit to Stand: Min assist           General transfer comment: cues for hand placement, assist for power up and control descent    Ambulation/Gait Ambulation/Gait assistance: Contact guard assist Gait Distance (Feet): 30 Feet Assistive device: Rolling walker (2 wheels) Gait Pattern/deviations: Step-through pattern, Decreased stride length, Trunk flexed Gait velocity: decr     General Gait Details: reports forward flexed posture for approx 2 weeks due to pain with cues for safety, attention to obstcles and direction   Stairs             Wheelchair Mobility     Tilt Bed    Modified Rankin (Stroke Patients Only)  Balance Overall balance assessment: Mild deficits observed, not formally tested (pt is able to maintain static standing no UE support for lower body dressing with toileting task)                                          Cognition Arousal: Alert Behavior During Therapy: WFL for tasks assessed/performed Overall Cognitive Status: Within Functional Limits for tasks assessed                                  General Comments: pt exhibiting some apparent confusion, pt not oriented to day, month or date, pt indicating procedure today and stated she could not have any food or water and at the end of the treatment session reported needing some water.        Exercises      General Comments        Pertinent Vitals/Pain Pain Assessment Pain Assessment: 0-10 Pain Score: 9  Pain Location: back, abdomen and bottom Pain Descriptors / Indicators: Grimacing, Guarding, Constant, Discomfort, Dull Pain Intervention(s): Limited activity within patient's tolerance, Monitored during session, Repositioned, Patient requesting pain meds-RN notified, RN gave pain meds during session    Home Living                          Prior Function            PT Goals (current goals can now be found in the care plan section) Acute Rehab PT Goals PT Goal Formulation: With patient Time For Goal Achievement: 01/03/23 Potential to Achieve Goals: Good Progress towards PT goals: Progressing toward goals    Frequency    Min 1X/week      PT Plan      Co-evaluation              AM-PAC PT "6 Clicks" Mobility   Outcome Measure  Help needed turning from your back to your side while in a flat bed without using bedrails?: A Little Help needed moving from lying on your back to sitting on the side of a flat bed without using bedrails?: A Lot Help needed moving to and from a bed to a chair (including a wheelchair)?: A Little Help needed standing up from a chair using your arms (e.g., wheelchair or bedside chair)?: A Little Help needed to walk in hospital room?: A Little Help needed climbing 3-5 steps with a railing? : A Lot 6 Click Score: 16    End of Session Equipment Utilized During Treatment: Gait belt Activity Tolerance: Patient limited by pain;Patient limited by fatigue Patient left: with call bell/phone within reach;in bed;with bed alarm set Nurse Communication:  Mobility status;Patient requests pain meds PT Visit Diagnosis: Difficulty in walking, not elsewhere classified (R26.2)     Time: 1104-1130 PT Time Calculation (min) (ACUTE ONLY): 26 min  Charges:    $Gait Training: 8-22 mins $Therapeutic Activity: 8-22 mins PT General Charges $$ ACUTE PT VISIT: 1 Visit                     Johnny Bridge, PT Acute Rehab    Jacqualyn Posey 12/23/2022, 12:26 PM

## 2022-12-23 NOTE — Plan of Care (Signed)

## 2022-12-23 NOTE — [place] FL2 (Signed)
[place] MEDICAID FL2 LEVEL OF CARE FORM     IDENTIFICATION  Patient Name: Alexandria Mclaughlin Birthdate: [birth year] Sex: female Admission Date (Current Location): 12/19/2022  [facility] and [place] Number:  Producer, television/film/video and Address:  [facility],  [address] [place], [place] [address]      Provider Number: [id number]  Attending Physician Name and Address:  Azucena Fallen, MD  Relative Name and Phone Number:  Sabino Dick [phone]  [phone]  Christopher,Elizabeth Daughter [phone]  [phone]    Current Level of Care: Hospital Recommended Level of Care: Skilled Nursing Facility Prior Approval Number:    Date Approved/Denied:   PASRR Number: [id number]  Discharge Plan: SNF    Current Diagnoses: Patient Active Problem List   Diagnosis Date Noted   Cancer-related breakthrough pain 12/21/2022   Need for emotional support 12/21/2022   Counseling and coordination of care 12/21/2022   Cancer associated pain 12/20/2022   High risk medication use 12/20/2022   Anxiety 12/20/2022   Constipation 12/20/2022   Medication management 12/20/2022   Palliative care encounter 12/20/2022   Goals of care, counseling/discussion 10/17/2022   Anemia due to antineoplastic chemotherapy 08/29/2022   Genetic testing 07/07/2022   Pancytopenia, acquired (HCC) 06/27/2022   COVID-19 virus infection 06/09/2022   Drug-induced hyperglycemia 05/30/2022   Ovarian cancer (HCC) 04/08/2022   Elevated tumor markers 04/08/2022   Peritoneal carcinomatosis (HCC) 04/08/2022   Mild cognitive impairment of uncertain or unknown etiology 01/24/2022   Multiple lacunar infarcts    Generalized anxiety disorder    Major depressive disorder 01/23/2022   GERD (gastroesophageal reflux disease) 01/23/2022   Right leg weakness    Acquired hallux rigidus of left foot 12/17/2020   Rotator cuff tendonitis, left 06/08/2018   Lumbar radiculopathy 04/26/2018   Lumbar radicular pain  07/29/2016   Myoclonus 09/19/2015   Therapeutic opioid induced constipation 07/25/2015   Essential hypertension 02/07/2015   HLD (hyperlipidemia) 02/07/2015   Low back pain 02/07/2015   BPPV (benign paroxysmal positional vertigo) 02/07/2015   Abnormality of gait 02/07/2015   Cerebrovascular disease    Lumbar facet arthropathy 07/05/2014   Acquired unequal leg length on left 08/08/2011   Lumbar post-laminectomy syndrome 06/11/2011   Sacroiliac joint dysfunction 06/11/2011   Subcortical infarction     Orientation RESPIRATION BLADDER Height & Weight     Self, Time, Situation, Place  O2 (2L) Continent Weight: 154 lb 1.6 oz (69.9 kg) Height:  5\' 3"  (160 cm)  BEHAVIORAL SYMPTOMS/MOOD NEUROLOGICAL BOWEL NUTRITION STATUS      Continent Diet  AMBULATORY STATUS COMMUNICATION OF NEEDS Skin   Limited Assist Verbally Normal                       Personal Care Assistance Level of Assistance  Bathing, Feeding, Dressing Bathing Assistance: Limited assistance Feeding assistance: Independent Dressing Assistance: Limited assistance     Functional Limitations Info  Sight, Hearing, Speech Sight Info: Adequate Hearing Info: Adequate Speech Info: Adequate    SPECIAL CARE FACTORS FREQUENCY  PT (By licensed PT), OT (By licensed OT)     PT Frequency: Minimum 5x a week OT Frequency: Minimum 5x a week            Contractures Contractures Info: Not present    Additional Factors Info  Code Status, Allergies, Psychotropic Code Status Info: DNR Allergies Info: No Known Allergies Psychotropic Info: DULoxetine (CYMBALTA) DR capsule 60 mg         Current Medications (  12/23/2022):  This is the current hospital active medication list Current Facility-Administered Medications  Medication Dose Route Frequency Provider Last Rate Last Admin   acetaminophen (TYLENOL) tablet 1,000 mg  1,000 mg Oral Q8H Mims, Lauren W, DO   1,000 mg at 12/23/22 1300   alum & mag hydroxide-simeth  (MAALOX/MYLANTA) 200-200-20 MG/5ML suspension 30 mL  30 mL Oral Q6H PRN Candelaria Stagers T, MD       bisacodyl (DULCOLAX) suppository 10 mg  10 mg Rectal Daily PRN Dolly Rias, MD   10 mg at 12/23/22 1147   Chlorhexidine Gluconate Cloth 2 % PADS 6 each  6 each Topical Daily Almon Hercules, MD   6 each at 12/23/22 0935   clonazePAM (KLONOPIN) tablet 0.5 mg  0.5 mg Oral QHS PRN Dolly Rias, MD   0.5 mg at 12/23/22 0336   DULoxetine (CYMBALTA) DR capsule 60 mg  60 mg Oral Daily Segars, Christiane Ha, MD   60 mg at 12/23/22 0935   fentaNYL (DURAGESIC) 75 MCG/HR 1 patch  1 patch Transdermal Wendall Stade, MD   1 patch at 12/23/22 1115   HYDROmorphone (DILAUDID) injection 0.5 mg  0.5 mg Intravenous Q3H PRN Alena Bills, DO   0.5 mg at 12/23/22 1109   HYDROmorphone (DILAUDID) tablet 4 mg  4 mg Oral Q3H PRN Alena Bills, DO   4 mg at 12/23/22 1258   irbesartan (AVAPRO) tablet 150 mg  150 mg Oral Daily Segars, Christiane Ha, MD   150 mg at 12/23/22 0934   melatonin tablet 10 mg  10 mg Oral QHS PRN Dolly Rias, MD   10 mg at 12/22/22 2130   methimazole (TAPAZOLE) tablet 5 mg  5 mg Oral Once per day on Monday Wednesday Friday Dolly Rias, MD   5 mg at 12/22/22 0901   ondansetron (ZOFRAN) injection 4 mg  4 mg Intravenous Q6H PRN Dolly Rias, MD   4 mg at 12/23/22 0809   pantoprazole (PROTONIX) EC tablet 40 mg  40 mg Oral Daily Candelaria Stagers T, MD   40 mg at 12/23/22 0934   polyethylene glycol (MIRALAX / GLYCOLAX) packet 17 g  17 g Oral BID Dolly Rias, MD   17 g at 12/22/22 2130   pregabalin (LYRICA) capsule 200 mg  200 mg Oral BID Azucena Fallen, MD   200 mg at 12/23/22 2440   senna (SENOKOT) tablet 17.2 mg  2 tablet Oral BID Dolly Rias, MD   17.2 mg at 12/23/22 0934   traZODone (DESYREL) tablet 100 mg  100 mg Oral Lorenza Chick, MD   100 mg at 12/22/22 2130     Discharge Medications: Please see discharge summary for a list of discharge medications.  Relevant Imaging  Results:  Relevant Lab Results:   Additional Information SS#: [id number]  Darleene Cleaver, LCSW

## 2022-12-23 NOTE — Progress Notes (Signed)
PROGRESS NOTE    Alexandria Mclaughlin  WUJ:[id number] DOB: 12/02/1940 DOA: 12/19/2022 PCP: Artis Delay, MD   Brief Narrative:  82 year old F with PMH of metastatic ovarian cancer with peritoneal carcinomatosis, lumbar arthritis spine mets, DDD, prior spinal fusion, CVA and mood disorder presenting from PM and R clinic due to progressive RLQ pain for few weeks and related ambulatory dysfunction due to pain, and admitted for pain control.  Palliative medicine following.  Assessment & Plan:   Principal Problem:   Ovarian cancer (HCC) Active Problems:   Cancer associated pain   High risk medication use   Anxiety   Constipation   Medication management   Palliative care encounter   Cancer-related breakthrough pain   Need for emotional support   Counseling and coordination of care   Diffuse intractable abdominal pain, uncontrolled Advancing metastatic ovarian cancer -Palliative care following, continue increased pain regimen as tolerated -Patient already on max dose Lyrica (wean gabapentin as tolerated) -Continue to increase Dilaudid p.o. and IV as well as fentanyl patch. -Possible plexus nerve block 12/23/2022 - follow-up with Dr. Worthy Rancher PA   Acute metabolic versus toxic encephalopathy, likely polypharmacy  -Patient admits to occasional hallucinations overnight in the setting of increased narcotic use -Unfortunately patient's pain continues to be poorly controlled, would not wean narcotics at this time although would attempt to transition from IV to p.o. to decrease risk of hospital delirium/encephalopathy  Metastatic ovarian cancer with peritoneal carcinomatosis, thoracic and lumbar spine mets -Palliative/symptomatic management as above   Anemia of chronic disease/leukopenia:  In the setting of malignancy   Physical deconditioning/ambulatory dysfunction, acute on chronic:  -At baseline uses a walker, continues to decline given above. -High risk for falling given above narcotic  regimen   Hx stroke: continue home statin  Mood d/o: continue home duloxetine  Hyperthyroidism: Continue home methimazole   Body mass index is 27.3 kg/m.  DVT prophylaxis: SCDs Start: 12/19/22 2227 Code Status:   Code Status: Limited: Do not attempt resuscitation (DNR) -DNR-LIMITED -Do Not Intubate/DNI  Family Communication: None present  Status is: Inpatient  Dispo: The patient is from: Home              Anticipated d/c is to: To be determined (patient states she is unable to return home - PT/OT currently recommending HHPT/OT)              Anticipated d/c date is: 24 to 48 hours              Patient currently not medically stable for discharge given ongoing need for IV narcotics for pain control  Consultants:  Palliative care  Procedures:  None  Antimicrobials:  None  Subjective: No acute issues or events overnight denies nausea vomiting diarrhea constipation headache fevers chills or chest pain.  Abdominal pain ongoing, minimal improvement on current regimen despite increase in dose.  Objective: Vitals:   12/22/22 0537 12/22/22 1227 12/22/22 2111 12/23/22 0456  BP: 119/66 127/67 (!) 151/77 (!) 107/52  Pulse: 91 83 78 78  Resp: 18 18 16 16   Temp: (!) 97.3 F (36.3 C) 99.1 F (37.3 C) 98.8 F (37.1 C) 98.8 F (37.1 C)  TempSrc: Oral Oral Oral Oral  SpO2: 99% 94% 95% (!) 88%  Weight:      Height:        Intake/Output Summary (Last 24 hours) at 12/23/2022 0748 Last data filed at 12/23/2022 0400 Gross per 24 hour  Intake 600 ml  Output 250 ml  Net 350  ml   Filed Weights   12/20/22 0056  Weight: 69.9 kg    Examination:  General:  Pleasantly resting in bed, No acute distress. HEENT:  Normocephalic atraumatic.  Sclerae nonicteric, noninjected.  Extraocular movements intact bilaterally. Neck:  Without mass or deformity.  Trachea is midline. Lungs:  Clear to auscultate bilaterally without rhonchi, wheeze, or rales. Heart:  Regular rate and rhythm.  Without  murmurs, rubs, or gallops. Abdomen:  Soft, nontender, nondistended.  Without guarding or rebound. Extremities: Without cyanosis, clubbing, edema, or obvious deformity. Skin:  Warm and dry, no erythema.   Data Reviewed: I have personally reviewed following labs and imaging studies  CBC: Recent Labs  Lab 12/19/22 1711 12/20/22 0657 12/21/22 0500 12/23/22 0345  WBC 3.2* 2.9* 3.8* 4.5  NEUTROABS 1.8  --   --  2.3  HGB 9.2* 8.5* 9.6* 9.4*  HCT 29.4* 27.9* 31.3* 30.7*  MCV 101.0* 103.3* 102.6* 101.0*  PLT 215 204 234 254   Basic Metabolic Panel: Recent Labs  Lab 12/19/22 1711 12/20/22 0657 12/21/22 0500  NA 137 138 139  K 3.8 3.7 3.9  CL 101 101 101  CO2 27 29 28   GLUCOSE 109* 101* 115*  BUN 12 13 13   CREATININE 0.46 0.49 0.51  CALCIUM 9.2 8.8* 9.4  MG  --  1.9 2.0  PHOS  --  3.9 4.1   GFR: Estimated Creatinine Clearance: 50.8 mL/min (by C-G formula based on SCr of 0.51 mg/dL). Liver Function Tests: Recent Labs  Lab 12/19/22 1711 12/21/22 0500  AST 13*  --   ALT 9  --   ALKPHOS 104  --   BILITOT 0.5  --   PROT 6.3*  --   ALBUMIN 2.9* 2.8*   Recent Labs  Lab 12/19/22 1711  LIPASE 21   Anemia Panel: No results for input(s): "VITAMINB12", "FOLATE", "FERRITIN", "TIBC", "IRON", "RETICCTPCT" in the last 72 hours.  No results found for this or any previous visit (from the past 240 hour(s)).   Radiology Studies: No results found.  Scheduled Meds:  acetaminophen  1,000 mg Oral Q8H   Chlorhexidine Gluconate Cloth  6 each Topical Daily   DULoxetine  60 mg Oral Daily   fentaNYL  1 patch Transdermal Q72H   gabapentin  300 mg Oral Daily   irbesartan  150 mg Oral Daily   methimazole  5 mg Oral Once per day on Monday Wednesday Friday   pantoprazole  40 mg Oral Daily   polyethylene glycol  17 g Oral BID   pregabalin  200 mg Oral BID   senna  2 tablet Oral BID   traZODone  100 mg Oral QHS   Continuous Infusions:   LOS: 2 days   Time spent:  Azucena Fallen, DO [facility]  If 7PM-7AM, please contact night-coverage [email or web address]  12/23/2022, 7:48 AM

## 2022-12-23 NOTE — Progress Notes (Signed)
Daily Progress Note   Patient Name: Alexandria Mclaughlin       Date: 12/23/2022 DOB: 1940/08/18  Age: 82 y.o. MRN#: [id number] Attending Physician: Azucena Fallen, MD Primary Care Physician: Artis Delay, MD Admit Date: 12/19/2022 Length of Stay: 2 days  Reason for Consultation/Follow-up: Pain control  Subjective:   CC: Patient notes pain not controlled. Following up regarding complex medical decision making.   Subjective:  Reviewed EMR prior to presenting to bedside.  Medication history reviewed, patient asks about when her nerve block will be done today, she states that she is due to get a new fentanyl patch today.  She remains on 50 mcg transdermal fentanyl Patient complains of right lower quadrant pain, pelvic pain.  her goals for medical care moving forward are such that she states she wants to be able to work with PT to be considered for rehab and then return home where she lives with her son. Talked about full comfort focused care and she again notes desire to work with PT at this point. Discussed balancing pain medications with sleepiness/confusion and patient clearly stated that when it comes to pain, she would rather be confused and sleepy then in pain. Acknowledged this and noted continued to adjust medication to find balance.  Review of Systems RLQ pain  Objective:   Vital Signs:  BP (!) 107/52 (BP Location: Left Arm)   Pulse 78   Temp 98.8 F (37.1 C) (Oral)   Resp 16   Ht 5\' 3"  (1.6 m)   Wt 69.9 kg   SpO2 (!) 88%   BMI 27.30 kg/m   Physical Exam: General: NAD, alert, chronically ill-appearing, laying in bed HENT: moist mucous membranes Cardiovascular: RRR Respiratory: no increased work of breathing noted, not in respiratory distress Neuro: awake, interactive; admits to having hallucinations at night and taking time to orient herself in AM Psych: appropriately answers all questions  Imaging: I personally reviewed recent imaging.   Assessment & Plan:    Assessment: Patient is an 82 year old female with a past medical history of metastatic ovarian cancer with peritoneal carcinomatosis as well as metastatic disease to lumbar and thoracic spine, degenerative disc disease with prior spinal fusion, CVA, on 12/19/2022 from [facility] for management of severe right lower quadrant pain. Imaging obtained on admission notes new small ascites in the abdomen and pelvis. Patient has known right ovarian mass with spine mets and prior compression fracture. Palliative medicine team consulted to assist with complex medical decision making.   Recommendations/Plan: # Complex medical decision making/goals of care:   - Participate with PT, potentially go to rehab, eventually transition home with son.                 -  Code Status: Limited: Do not attempt resuscitation (DNR) -DNR-LIMITED -Do Not Intubate/DNI     # Symptom management                -Pain, in setting of metastatic ovarian cancer                              -Continue Tylenol scheduled 1000 mg every 8 hours during the day -Continue duloxetine 60 mg daily -Decrease gabapentin to 300 mg to once daily tomorrow as titrating off when plan to stop after tomorrows dose since Lyrica increase.  -Continue pregabalin 200 mg 2 times daily; hospitalist adjusted on 10/28 -Continue fentanyl patch to 75 mcg/h every 72 hours  Continue oral dilaudid to 4mg  q3hrs prn  -Change IV Dilaudid to 0.5 mg every 3 hours as needed for breakthrough management after oral medications.   IR note reviewed about the superior hypogastric plexus nerve block for symptom management. Think it is appropraite to pursue nerve block to minimize Aes of medications.                   -Anxiety                               -Continue home medication of clonazepam 5 mg nightly                  -Constipation                               -Continue MiraLAX 17 g twice daily                               -Continue senna 2 tabs daily                                -Continue bisacodyl suppository prn   -In note having BM, would recommend enema   # Psychosocial Support:  -Patient lives at home with son, [name].   # Discharge Planning: To Be Determined   Discussed with: patient  Thank you for allowing the palliative care team to participate in the care [name]. Mod MDM Rosalin Hawking MD Palliative Care Provider PMT # [phone]  If patient remains symptomatic despite maximum doses, please call PMT at [phone] between 0700 and 1900. Outside of these hours, please call attending, as PMT does not have night coverage.  *Please note that this is a verbal dictation therefore any spelling or grammatical errors are due to the "Dragon Medical One" system interpretation.

## 2022-12-23 NOTE — TOC Progression Note (Addendum)
Transition of Care [facility]) - Progression Note    Patient Details  Name: Alexandria Mclaughlin MRN: [id number] Date of Birth: Aug 25, 1940  Transition of Care [facility]) CM/SW Contact  Darleene Cleaver, [place] Phone Number: 12/23/2022, 1:58 PM  Clinical Narrative:     CSW received consult that patient would like to speak to [facility].  CSW contacted patient and spoke to her to discuss SNF as an option.  Per patient she does not feel like she can return back home at this time and needs SNF prior to returning back home.  CSW explained the process that insurance will have to approve her to go to SNF, patient expressed understanding.  CSW discussed where patient would like to go, she said she would prefer [facility].  CSW was given permission to begin bed search in [facility].  PT reassessed and are recommending SNF, TOC to provide bed offers for patient once they are ready.  Expected Discharge Plan: Home w Home Health Services Barriers to Discharge: Continued Medical Work up  Expected Discharge Plan and Services SNF for short term rehab.   Discharge Planning Services: CM Consult Post Acute Care Choice: Home Health Living arrangements for the past 2 months: Apartment                           HH Arranged: PT HH Agency: [facility] Date [facility] Agency Contacted: 12/21/22 Time HH Agency Contacted: [phone] Representative spoke with at [facility] Agency: Kandee Keen   Social Determinants of Health (SDOH) Interventions SDOH Screenings   Food Insecurity: No Food Insecurity (12/21/2022)  Housing: Patient Declined (12/21/2022)  Transportation Needs: No Transportation Needs (12/21/2022)  Utilities: Not At Risk (12/21/2022)  Depression (PHQ2-9): Low Risk  (11/19/2022)  Social Connections: Unknown (07/04/2021)   Received from [facility]  Tobacco Use: Medium Risk (12/20/2022)    Readmission Risk Interventions     No data to display

## 2022-12-23 NOTE — Procedures (Signed)
Interventional Radiology Procedure Note  Procedure: Fluoroscopic guided superior hypogastric nerve block  Findings: Please refer to procedural dictation for full description. 8 mL 0.5% bupivicaine + 80 mg depomedrol.  Complications: None immediate  Estimated Blood Loss: < 5 mL  Recommendations: IR will follow.     Marliss Coots, MD

## 2022-12-23 NOTE — NC FL2 (Deleted)
[place] MEDICAID [id number] LEVEL OF CARE FORM     IDENTIFICATION  Patient Name: Alexandria Mclaughlin Birthdate: [birth year] Sex: female Admission Date (Current Location): 12/19/2022  [facility] [place] and [place] Number:  Producer, television/film/video and Address:  [facility],  [address] [place], [place] [address]      Provider Number: [id number]  Attending Physician Name and Address:  Azucena Fallen, MD  Relative Name and Phone Number:  Sabino Dick [phone]  [phone]  Christopher,Elizabeth Daughter [phone]  [phone]    Current Level of Care: Hospital Recommended Level of Care: Skilled Nursing Facility Prior Approval Number:    Date Approved/Denied:   PASRR Number: [id number]  Discharge Plan: SNF    Current Diagnoses: Patient Active Problem List   Diagnosis Date Noted   Cancer-related breakthrough pain 12/21/2022   Need for emotional support 12/21/2022   Counseling and coordination of care 12/21/2022   Cancer associated pain 12/20/2022   High risk medication use 12/20/2022   Anxiety 12/20/2022   Constipation 12/20/2022   Medication management 12/20/2022   Palliative care encounter 12/20/2022   Goals of care, counseling/discussion 10/17/2022   Anemia due to antineoplastic chemotherapy 08/29/2022   Genetic testing 07/07/2022   Pancytopenia, acquired (HCC) 06/27/2022   COVID-19 virus infection 06/09/2022   Drug-induced hyperglycemia 05/30/2022   Ovarian cancer (HCC) 04/08/2022   Elevated tumor markers 04/08/2022   Peritoneal carcinomatosis (HCC) 04/08/2022   Mild cognitive impairment of uncertain or unknown etiology 01/24/2022   Multiple lacunar infarcts    Generalized anxiety disorder    Major depressive disorder 01/23/2022   GERD (gastroesophageal reflux disease) 01/23/2022   Right leg weakness    Acquired hallux rigidus of left foot 12/17/2020   Rotator cuff tendonitis, left 06/08/2018   Lumbar radiculopathy 04/26/2018   Lumbar radicular pain  07/29/2016   Myoclonus 09/19/2015   Therapeutic opioid induced constipation 07/25/2015   Essential hypertension 02/07/2015   HLD (hyperlipidemia) 02/07/2015   Low back pain 02/07/2015   BPPV (benign paroxysmal positional vertigo) 02/07/2015   Abnormality of gait 02/07/2015   Cerebrovascular disease    Lumbar facet arthropathy 07/05/2014   Acquired unequal leg length on left 08/08/2011   Lumbar post-laminectomy syndrome 06/11/2011   Sacroiliac joint dysfunction 06/11/2011   Subcortical infarction     Orientation RESPIRATION BLADDER Height & Weight     Self, Time, Situation, Place  O2 (2L) Continent Weight: 154 lb 1.6 oz (69.9 kg) Height:  5\' 3"  (160 cm)  BEHAVIORAL SYMPTOMS/MOOD NEUROLOGICAL BOWEL NUTRITION STATUS      Continent    AMBULATORY STATUS COMMUNICATION OF NEEDS Skin   Limited Assist Verbally Normal                       Personal Care Assistance Level of Assistance  Bathing, Feeding, Dressing Bathing Assistance: Limited assistance Feeding assistance: Independent Dressing Assistance: Limited assistance     Functional Limitations Info  Sight, Hearing, Speech Sight Info: Adequate Hearing Info: Adequate Speech Info: Adequate    SPECIAL CARE FACTORS FREQUENCY                       Contractures Contractures Info: Not present    Additional Factors Info  Code Status, Allergies, Psychotropic Code Status Info: DNR Allergies Info: No Known Allergies Psychotropic Info: DULoxetine (CYMBALTA) DR capsule 60 mg         Current Medications (12/23/2022):  This is the current hospital active medication list Current Facility-Administered Medications  Medication Dose Route Frequency Provider Last Rate Last Admin   acetaminophen (TYLENOL) tablet 1,000 mg  1,000 mg Oral Q8H Mims, Lauren W, DO   1,000 mg at 12/23/22 1300   alum & mag hydroxide-simeth (MAALOX/MYLANTA) 200-200-20 MG/5ML suspension 30 mL  30 mL Oral Q6H PRN Candelaria Stagers T, MD       bisacodyl  (DULCOLAX) suppository 10 mg  10 mg Rectal Daily PRN Dolly Rias, MD   10 mg at 12/23/22 1147   Chlorhexidine Gluconate Cloth 2 % PADS 6 each  6 each Topical Daily Almon Hercules, MD   6 each at 12/23/22 0935   clonazePAM (KLONOPIN) tablet 0.5 mg  0.5 mg Oral QHS PRN Dolly Rias, MD   0.5 mg at 12/23/22 0336   DULoxetine (CYMBALTA) DR capsule 60 mg  60 mg Oral Daily Segars, Christiane Ha, MD   60 mg at 12/23/22 0935   fentaNYL (DURAGESIC) 75 MCG/HR 1 patch  1 patch Transdermal Wendall Stade, MD   1 patch at 12/23/22 1115   HYDROmorphone (DILAUDID) injection 0.5 mg  0.5 mg Intravenous Q3H PRN Alena Bills, DO   0.5 mg at 12/23/22 1109   HYDROmorphone (DILAUDID) tablet 4 mg  4 mg Oral Q3H PRN Alena Bills, DO   4 mg at 12/23/22 1258   irbesartan (AVAPRO) tablet 150 mg  150 mg Oral Daily Dolly Rias, MD   150 mg at 12/23/22 0934   melatonin tablet 10 mg  10 mg Oral QHS PRN Dolly Rias, MD   10 mg at 12/22/22 2130   methimazole (TAPAZOLE) tablet 5 mg  5 mg Oral Once per day on Monday Wednesday Friday Dolly Rias, MD   5 mg at 12/22/22 0901   ondansetron (ZOFRAN) injection 4 mg  4 mg Intravenous Q6H PRN Dolly Rias, MD   4 mg at 12/23/22 0809   pantoprazole (PROTONIX) EC tablet 40 mg  40 mg Oral Daily Candelaria Stagers T, MD   40 mg at 12/23/22 0934   polyethylene glycol (MIRALAX / GLYCOLAX) packet 17 g  17 g Oral BID Dolly Rias, MD   17 g at 12/22/22 2130   pregabalin (LYRICA) capsule 200 mg  200 mg Oral BID Azucena Fallen, MD   200 mg at 12/23/22 1610   senna (SENOKOT) tablet 17.2 mg  2 tablet Oral BID Dolly Rias, MD   17.2 mg at 12/23/22 0934   traZODone (DESYREL) tablet 100 mg  100 mg Oral Lorenza Chick, MD   100 mg at 12/22/22 2130     Discharge Medications: Please see discharge summary for a list of discharge medications.  Relevant Imaging Results:  Relevant Lab Results:   Additional Information SS#: [id number]  Darleene Cleaver,  LCSW

## 2022-12-24 ENCOUNTER — Telehealth: Payer: Self-pay | Admitting: *Deleted

## 2022-12-24 DIAGNOSIS — Z515 Encounter for palliative care: Secondary | ICD-10-CM | POA: Diagnosis not present

## 2022-12-24 DIAGNOSIS — R531 Weakness: Secondary | ICD-10-CM | POA: Diagnosis not present

## 2022-12-24 DIAGNOSIS — Z7189 Other specified counseling: Secondary | ICD-10-CM | POA: Diagnosis not present

## 2022-12-24 DIAGNOSIS — C561 Malignant neoplasm of right ovary: Secondary | ICD-10-CM | POA: Diagnosis not present

## 2022-12-24 LAB — DRUG TOX MONITOR 1 W/CONF, ORAL FLD
Amphetamines: NEGATIVE ng/mL (ref <10)
Barbiturates: NEGATIVE ng/mL (ref <10)
Benzodiazepines: NEGATIVE ng/mL (ref <0.50)
Buprenorphine: NEGATIVE ng/mL (ref <0.10)
Cocaine: NEGATIVE ng/mL (ref <5.0)
Codeine: NEGATIVE ng/mL (ref <2.5)
Dihydrocodeine: NEGATIVE ng/mL (ref <2.5)
Fentanyl: 0.98 ng/mL — ABNORMAL HIGH (ref <0.10)
Fentanyl: POSITIVE ng/mL — AB (ref <0.10)
Heroin Metabolite: NEGATIVE ng/mL (ref <1.0)
Hydrocodone: NEGATIVE ng/mL (ref <2.5)
Hydromorphone: NEGATIVE ng/mL (ref <2.5)
MARIJUANA: NEGATIVE ng/mL (ref <2.5)
MDMA: NEGATIVE ng/mL (ref <10)
Meprobamate: NEGATIVE ng/mL (ref <2.5)
Methadone: NEGATIVE ng/mL (ref <5.0)
Morphine: NEGATIVE ng/mL (ref <2.5)
Nicotine Metabolite: NEGATIVE ng/mL (ref <5.0)
Norhydrocodone: NEGATIVE ng/mL (ref <2.5)
Noroxycodone: 25.6 ng/mL — ABNORMAL HIGH (ref <2.5)
Opiates: POSITIVE ng/mL — AB (ref <2.5)
Oxycodone: >250 ng/mL — ABNORMAL HIGH (ref <2.5)
Oxymorphone: NEGATIVE ng/mL (ref <2.5)
Phencyclidine: NEGATIVE ng/mL (ref <10)
Tapentadol: NEGATIVE ng/mL (ref <5.0)
Tramadol: NEGATIVE ng/mL (ref <5.0)
Zolpidem: NEGATIVE ng/mL (ref <5.0)

## 2022-12-24 LAB — DRUG TOX ALC METAB W/CON, ORAL FLD: Alcohol Metabolite: NEGATIVE ng/mL (ref <25)

## 2022-12-24 NOTE — TOC Progression Note (Signed)
Transition of Care [facility]) - Progression Note    Patient Details  Name: Alexandria Mclaughlin MRN: [id number] Date of Birth: [birth year]  Transition of Care [facility] CM/SW Contact  Beckie Busing, RN Phone [phone]  12/24/2022, 3:32 PM  Clinical Narrative:    List of bed offers provided to patient. Patient would like to review offers with son. Cm will follow up son currently at work.    Expected Discharge Plan: Home w Home Health Services Barriers to Discharge: Continued Medical Work up  Expected Discharge Plan and Services   Discharge Planning Services: CM Consult Post Acute Care Choice: Home Health Living arrangements for the past 2 months: Apartment                           HH Arranged: PT HH Agency: [facility] Date [facility] Agency Contacted: 12/21/22 Time HH Agency Contacted: [phone] Representative spoke with at [facility] Agency: Kandee Keen   Social Determinants of Health (SDOH) Interventions SDOH Screenings   Food Insecurity: No Food Insecurity (12/21/2022)  Housing: Patient Declined (12/21/2022)  Transportation Needs: No Transportation Needs (12/21/2022)  Utilities: Not At Risk (12/21/2022)  Depression (PHQ2-9): Low Risk  (11/19/2022)  Social Connections: Unknown (07/04/2021)   Received from [facility]  Tobacco Use: Medium Risk (12/20/2022)    Readmission Risk Interventions     No data to display

## 2022-12-24 NOTE — Telephone Encounter (Signed)
[name] left VM stating she was in hospital and would not be able to come to appts tomorrow at [facility], so we could cancel them.  Dr. Bertis Ruddy informed of patient's call and she stated she would keep appts on schedule as she did not know when pt scheduled for d/c.  Contacted [name] to let her know message received and Dr. Bertis Ruddy informed. Also informed her that per Dr. Bertis Ruddy, appts will be not be cancelled at this time as discharge date not known.   [name] stated she did not think she would be at [facility] tomorrow and wanted CC to know that she would be discharged to Rehab. She shared that she'd gone to hospital in severe (described as 9/10) pain and that she'd had a procedure today to try to decrease the pain. She said she wasn't sure how much the procedure would help.

## 2022-12-24 NOTE — Progress Notes (Signed)
Mobility Specialist - Progress Note   12/24/22 1600  Oxygen Therapy  SpO2 (!) 86 %  O2 Device Room Air  Patient Activity (if Appropriate) Ambulating  Mobility  Activity Ambulated with assistance in hallway  Level of Assistance Contact guard assist, steadying assist  Assistive Device Front wheel walker  Distance Ambulated (ft) 40 ft  Activity Response Tolerated well  Mobility Referral Yes  $Mobility charge 1 Mobility  Mobility Specialist Start Time (ACUTE ONLY) 0336  Mobility Specialist Stop Time (ACUTE ONLY) 0354  Mobility Specialist Time Calculation (min) (ACUTE ONLY) 18 min   Pt received in bed and agreeable to mobility. After ambulating ~22ft pt became shaky d/t abdomen pain. Halted further ambulation & wheeled pt back to room. Assisted pt w/ getting back into bed with help of nursing staff. No other complaints during session. Pt to bed after session with all needs met.   Pre-mobility: 93% SpO2 (RA) During mobility: 86% SpO2 (RA) Post-mobility: 90% SPO2 (RA)  Chief Technology Officer

## 2022-12-24 NOTE — Progress Notes (Signed)
Occupational Therapy Treatment Patient Details Name: Alexandria Mclaughlin MRN: [id number] DOB: January 20, 1941 Today's Date: 12/24/2022   History of present illness Patient is a 82 yr old female  who was brought in from clinic due to subacute on chronic progressive RLQ pain suspect related to her R sided ovarian mass.PMH: metastatic ovarian cancer with peritoneal carcinomatosis, mets to lumbar and thoracic spine, degenerative disc disease, prior spinal fusion, CVA, mood disorder,   OT comments  The pt was instructed on compensatory techniques for lower body dressing, a bedside commode transfer, and toileting at bedside commode level. She required assistance for such tasks. She reported having 7/10 abdominal pain. She was noted to be with intermittent generalized body shakiness with progressive activity, which she reported to be acute in nature; as such, she required constant hands-on assist for standing tasks. Patient will benefit from continued inpatient follow up therapy, <3 hours/day.       If plan is discharge home, recommend the following:  A lot of help with walking and/or transfers;A lot of help with bathing/dressing/bathroom;Assistance with cooking/housework   Equipment Recommendations  None recommended by OT    Recommendations for Other Services      Precautions / Restrictions Precautions Precautions: Fall Restrictions Weight Bearing Restrictions: No       Mobility Bed Mobility Overal bed mobility: Needs Assistance Bed Mobility: Sit to Supine       Sit to supine: Mod assist, Used rails        Transfers Overall transfer level: Needs assistance Equipment used: Rolling walker (2 wheels) Transfers: Sit to/from Stand             General transfer comment: cues for hand placement, assist for power up and control descent         ADL either performed or assessed with clinical judgement   ADL   Eating/Feeding: Sitting                   Lower Body Dressing:  Moderate assistance Lower Body Dressing Details (indicate cue type and reason): The pt was instructed on implementing the figure 4 technique for lower body dressing tasks, given spinal precautions. She demonstrated teach back for sock management seateded in the chair, requring mod assist overall. She reported use of a sock aid, reacher and long shoehorn for lower body dressing tasks in the home. Toilet Transfer: BSC/3in1;Rolling walker (2 wheels);Minimal assistance Toilet Transfer Details (indicate cue type and reason): The pt performed a step pivot transfer to the bedside commode using a RW. She required steadying assist in standing, min cues for RW placement, and cues to reach back for hand rails prior to sitting. Toileting- Clothing Manipulation and Hygiene: Moderate assistance;Sit to/from stand;Cueing for sequencing Toileting - Clothing Manipulation Details (indicate cue type and reason): OT provided ADL instruction for toileting at bedside commode level. The pt required general cues for best [facility] transfer technique, steadying assist in standing, mod assist for clothing management, including pulling underwear down and up in standing, and assist for peri-hygiene after the pt urinated.              Cognition Arousal: Alert Behavior During Therapy: WFL for tasks assessed/performed Overall Cognitive Status: Within Functional Limits for tasks assessed                        Pertinent Vitals/ Pain       Pain Assessment Pain Assessment: 0-10 Pain Score: 7  Pain Location: abdomen Pain Intervention(s): Limited  activity within patient's tolerance, Monitored during session, Patient requesting pain meds-RN notified         Frequency  Min 1X/week        Progress Toward Goals  OT Goals(current goals can now be found in the care plan section)     Acute Rehab OT Goals OT Goal Formulation: With patient Time For Goal Achievement: 01/04/23 Potential to Achieve Goals: Fair  Plan          AM-PAC OT "6 Clicks" Daily Activity     Outcome Measure   Help from another person eating meals?: A Little Help from another person taking care of personal grooming?: A Little Help from another person toileting, which includes using toliet, bedpan, or urinal?: A Lot Help from another person bathing (including washing, rinsing, drying)?: A Lot Help from another person to put on and taking off regular upper body clothing?: A Little Help from another person to put on and taking off regular lower body clothing?: A Lot 6 Click Score: 15    End of Session Equipment Utilized During Treatment: Gait belt;Rolling walker (2 wheels)  OT Visit Diagnosis: Unsteadiness on feet (R26.81);Muscle weakness (generalized) (M62.81);Pain;Other abnormalities of gait and mobility (R26.89)   Activity Tolerance Patient limited by pain   Patient Left in bed;with call bell/phone within reach;with bed alarm set   Nurse Communication Patient requests pain meds        Time: 1450-1515 OT Time Calculation (min): 25 min  Charges: OT General Charges $OT Visit: 1 Visit OT Treatments $Self Care/Home Management : 8-22 mins $Therapeutic Activity: 8-22 mins     Reuben Likes, OTR/L 12/24/2022, 5:03 PM

## 2022-12-24 NOTE — Progress Notes (Signed)
Daily Progress Note   Patient Name: Alexandria Mclaughlin       Date: 12/24/2022 DOB: 05-30-1940  Age: 82 y.o. MRN#: [id number] Attending Physician: Kathrynn Running, MD Primary Care Physician: Artis Delay, MD Admit Date: 12/19/2022 Length of Stay: 3 days  Reason for Consultation/Follow-up: Pain control  Subjective:   CC: Patient notes pain not controlled. Following up regarding complex medical decision making.   Subjective:  Reviewed EMR prior to presenting to bedside.  Medication history reviewed, s/p superior hypogastric nerve block on 12-23-22.    Review of Systems RLQ pain  Objective:   Vital Signs:  BP (!) 92/54 (BP Location: Left Arm)   Pulse 72   Temp 97.7 F (36.5 C) (Oral)   Resp 14   Ht 5\' 3"  (1.6 m)   Wt 69.9 kg   SpO2 98%   BMI 27.30 kg/m   Physical Exam: General: NAD, alert, chronically ill-appearing, laying in bed HENT: moist mucous membranes Cardiovascular: RRR Respiratory: no increased work of breathing noted, not in respiratory distress Neuro: awake, interactive; admits to having hallucinations at night and taking time to orient herself in AM Psych: appropriately answers all questions  Imaging: I personally reviewed recent imaging.   Assessment & Plan:   Assessment: Patient is an 82 year old female with a past medical history of metastatic ovarian cancer with peritoneal carcinomatosis as well as metastatic disease to lumbar and thoracic spine, degenerative disc disease with prior spinal fusion, CVA, on 12/19/2022 from [facility] clinic for management of severe right lower quadrant pain. Imaging obtained on admission notes new small ascites in the abdomen and pelvis. Patient has known right ovarian mass with spine mets and prior compression fracture. Palliative medicine team consulted to assist with complex medical decision making.   Recommendations/Plan: # Complex medical decision making/goals of care:   - Participate with PT, potentially go to rehab,  eventually transition home with son.                 -  Code Status: Limited: Do not attempt resuscitation (DNR) -DNR-LIMITED -Do Not Intubate/DNI     # Symptom management                -Pain, in setting of metastatic ovarian cancer                              -Continue Tylenol scheduled 1000 mg every 8 hours during the day -Continue duloxetine 60 mg daily -Continue pregabalin 200 mg 2 times daily; hospitalist adjusted on 10/28 -Continue fentanyl patch to 75 mcg/h every 72 hours  Continue oral dilaudid to 4mg  q3hrs prn  -Change IV Dilaudid to 0.5 mg every 3 hours as needed for breakthrough management after oral medications.   IR performed superior hypogastric plexus nerve block for symptom management.                   -Anxiety                               -Continue home medication of clonazepam 5 mg nightly                  -Constipation                               -Continue MiraLAX 17 g twice daily                               -  Continue senna 2 tabs daily                               -Continue bisacodyl suppository prn   -In note having BM, would recommend enema   # Psychosocial Support:  -Patient lives at home with son, [name].   # Discharge Planning: To Be Determined   Discussed with: patient  Thank you for allowing the palliative care team to participate in the care [name]. low MDM Rosalin Hawking MD Palliative Care Provider PMT # [phone]  If patient remains symptomatic despite maximum doses, please call PMT at [phone] between 0700 and 1900. Outside of these hours, please call attending, as PMT does not have night coverage.  *Please note that this is a verbal dictation therefore any spelling or grammatical errors are due to the "Dragon Medical One" system interpretation.

## 2022-12-24 NOTE — Progress Notes (Signed)
PROGRESS NOTE    Alexandria Mclaughlin  MVH:[id number] DOB: 1940-03-09 DOA: 12/19/2022 PCP: Artis Delay, MD   Brief Narrative:  82 year old F with PMH of metastatic ovarian cancer with peritoneal carcinomatosis, lumbar arthritis spine mets, DDD, prior spinal fusion, CVA and mood disorder presenting from [facility] due to progressive RLQ pain for few weeks and related ambulatory dysfunction due to pain, and admitted for pain control.  Palliative medicine following.  Assessment & Plan:   Principal Problem:   Ovarian cancer (HCC) Active Problems:   Cancer associated pain   High risk medication use   Anxiety   Constipation   Medication management   Palliative care encounter   Cancer-related breakthrough pain   Need for emotional support   Counseling and coordination of care   Diffuse intractable abdominal pain, uncontrolled Advancing metastatic ovarian cancer -Palliative care following, continue increased pain regimen as tolerated - current regimen is scheduled tylenol, duloxetine, pregabalin, fentanyl patch, oral dilaudid prn, iv dilaudid for breakthrough pain - s/p superior hypogastric plexus block with IR on 10/29, patient reports modest improvement in pain since  Acute metabolic versus toxic encephalopathy, likely polypharmacy  -Patient admits to occasional hallucinations overnight in the setting of increased narcotic use -Unfortunately patient's pain continues to be poorly controlled, would not wean narcotics at this time although would attempt to transition from IV to p.o. to decrease risk of hospital delirium/encephalopathy - currently lucid  Metastatic ovarian cancer with peritoneal carcinomatosis, thoracic and lumbar spine mets -Palliative/symptomatic management as above   Anemia of chronic disease/leukopenia:  In the setting of malignancy   Physical deconditioning/ambulatory dysfunction, acute on chronic:  -PT advising snf and patient desires that, bed search underway    Hx stroke: continue home statin  Mood d/o: continue home duloxetine  Hyperthyroidism: Continue home methimazole   Body mass index is 27.3 kg/m.  DVT prophylaxis: SCDs Start: 12/19/22 2227 Code Status:   Code Status: Limited: Do not attempt resuscitation (DNR) -DNR-LIMITED -Do Not Intubate/DNI  Family Communication: daughter updated telephonically 10/30  Status is: Inpatient  Dispo: The patient is from: Home              Anticipated d/c is to: SNF              Anticipated d/c date is: tbd  Consultants:  Palliative care  Procedures:  IR nerve block  Antimicrobials:  None  Subjective: Reports ongoing pain mainly lower abdomen. One bm this morning, tolerating diet. Says pain is 8/10 today, improved from 9 yesterday before nerve block  Objective: Vitals:   12/23/22 2020 12/24/22 0541 12/24/22 0544 12/24/22 1349  BP: (!) 103/57 (!) 97/50 (!) 92/54 (!) 94/56  Pulse: 92 72  76  Resp: 14 14  18   Temp: 98.1 F (36.7 C) 97.7 F (36.5 C)  (!) 97.5 F (36.4 C)  TempSrc: Oral Oral  Oral  SpO2: 90% 98%  100%  Weight:      Height:        Intake/Output Summary (Last 24 hours) at 12/24/2022 1447 Last data filed at 12/24/2022 1300 Gross per 24 hour  Intake 240 ml  Output --  Net 240 ml   Filed Weights   12/20/22 0056  Weight: 69.9 kg    Examination:  General:  Pleasantly resting in bed, No acute distress. HEENT:  Normocephalic atraumatic.  . Lungs:  Clear to auscultate bilaterally without rhonchi, wheeze, or rales. Heart:  Regular rate and rhythm.  Without murmurs, rubs, or gallops. Abdomen:  Soft, mild tenderness lower quadrants Extremities: Without cyanosis, clubbing, edema, or obvious deformity. Skin:  Warm and dry, no erythema. Pale   Data Reviewed: I have personally reviewed following labs and imaging studies  CBC: Recent Labs  Lab 12/19/22 1711 12/20/22 0657 12/21/22 0500 12/23/22 0345  WBC 3.2* 2.9* 3.8* 4.5  NEUTROABS 1.8  --   --  2.3  HGB 9.2*  8.5* 9.6* 9.4*  HCT 29.4* 27.9* 31.3* 30.7*  MCV 101.0* 103.3* 102.6* 101.0*  PLT 215 204 234 254   Basic Metabolic Panel: Recent Labs  Lab 12/19/22 1711 12/20/22 0657 12/21/22 0500  NA 137 138 139  K 3.8 3.7 3.9  CL 101 101 101  CO2 27 29 28   GLUCOSE 109* 101* 115*  BUN 12 13 13   CREATININE 0.46 0.49 0.51  CALCIUM 9.2 8.8* 9.4  MG  --  1.9 2.0  PHOS  --  3.9 4.1   GFR: Estimated Creatinine Clearance: 50.8 mL/min (by C-G formula based on SCr of 0.51 mg/dL). Liver Function Tests: Recent Labs  Lab 12/19/22 1711 12/21/22 0500  AST 13*  --   ALT 9  --   ALKPHOS 104  --   BILITOT 0.5  --   PROT 6.3*  --   ALBUMIN 2.9* 2.8*   Recent Labs  Lab 12/19/22 1711  LIPASE 21   Anemia Panel: No results for input(s): "VITAMINB12", "FOLATE", "FERRITIN", "TIBC", "IRON", "RETICCTPCT" in the last 72 hours.  No results found for this or any previous visit (from the past 240 hour(s)).   Radiology Studies: IR BLOCK INJ HYPOGASTRIC PLEXUS  Result Date: 12/23/2022 CLINICAL DATA:  82 year old female with advanced stage ovarian cancer with retractable pelvic pain. EXAM: Fluoroscopic guided superior hypogastric nerve block. ANESTHESIA/SEDATION: Moderate (conscious) sedation was employed during this procedure. A total of Versed 1 mg and Fentanyl 50 mcg was administered intravenously. Moderate Sedation Time: 10 minutes. The patient's level of consciousness and vital signs were monitored continuously by radiology nursing throughout the procedure under my direct supervision. MEDICATIONS: 80 mg Depo-Medrol, 8 mL 0.5% bupivacaine CONTRAST:  2mL OMNIPAQUE IOHEXOL 300 MG/ML  SOLN COMPLICATIONS: None immediate PROCEDURE: Fluoroscopic guided superior hypogastric nerve block. FINDINGS: The patient was positioned supine on the IR table. The lower abdomen was prepped and draped in standard fashion. The procedure was planned. Under direct fluoroscopic visualization, a 22 gauge Chiba needle was directed to  the anterior aspect of the L5 vertebral body. 1% lidocaine was injected which flowed freely without resistance. Contrast was injected which demonstrated opacification in the region of the superior hypogastric nerve without evidence of vascular opacification. Next, a solution of 80 mg Depo-Medrol and 8 mL 0.5% bupivacaine was administered. The needle was then removed. The patient tolerated procedure well was transferred back to the floor in good condition. IMPRESSION: Technically successful fluoroscopic guided superior hypogastric nerve block with local anesthetic and steroid. Marliss Coots, MD [facility] Electronically Signed   By: Marliss Coots M.D.   On: 12/23/2022 16:56    Scheduled Meds:  acetaminophen  1,000 mg Oral Q8H   Chlorhexidine Gluconate Cloth  6 each Topical Daily   DULoxetine  60 mg Oral Daily   fentaNYL  1 patch Transdermal Q72H   irbesartan  150 mg Oral Daily   methimazole  5 mg Oral Once per day on Monday Wednesday Friday   pantoprazole  40 mg Oral Daily   polyethylene glycol  17 g Oral BID   pregabalin  200  mg Oral BID   senna  2 tablet Oral BID   traZODone  100 mg Oral QHS   Continuous Infusions:   LOS: 3 days   Time spent: 35 min  Silvano Bilis, MD [facility]  If 7PM-7AM, please contact night-coverage [email or web address]  12/24/2022, 2:47 PM

## 2022-12-24 NOTE — Plan of Care (Signed)
  Problem: Education: Goal: Knowledge of General Education information will improve Description: Including pain rating scale, medication(s)/side effects and non-pharmacologic comfort measures Outcome: Progressing   Problem: Clinical Measurements: Goal: Will remain free from infection Outcome: Progressing   Problem: Nutrition: Goal: Adequate nutrition will be maintained Outcome: Progressing   Problem: Elimination: Goal: Will not experience complications related to urinary retention Outcome: Progressing   Problem: Safety: Goal: Ability to remain free from injury will improve Outcome: Progressing

## 2022-12-25 ENCOUNTER — Inpatient Hospital Stay: Payer: Medicare HMO | Admitting: Nurse Practitioner

## 2022-12-25 ENCOUNTER — Inpatient Hospital Stay: Payer: Medicare HMO | Admitting: Hematology and Oncology

## 2022-12-25 ENCOUNTER — Inpatient Hospital Stay: Payer: Medicare HMO

## 2022-12-25 ENCOUNTER — Other Ambulatory Visit: Payer: Self-pay | Admitting: Hematology and Oncology

## 2022-12-25 DIAGNOSIS — Z7189 Other specified counseling: Secondary | ICD-10-CM | POA: Diagnosis not present

## 2022-12-25 DIAGNOSIS — C561 Malignant neoplasm of right ovary: Secondary | ICD-10-CM | POA: Diagnosis not present

## 2022-12-25 DIAGNOSIS — Z515 Encounter for palliative care: Secondary | ICD-10-CM | POA: Diagnosis not present

## 2022-12-25 NOTE — Progress Notes (Signed)
Mobility Specialist - Progress Note   12/25/22 1132  Mobility  Activity Ambulated with assistance in hallway  Level of Assistance Standby assist, set-up cues, supervision of patient - no hands on  Assistive Device Front wheel walker  Distance Ambulated (ft) 80 ft  Activity Response Tolerated well  Mobility Referral Yes  $Mobility charge 1 Mobility  Mobility Specialist Start Time (ACUTE ONLY) 1044  Mobility Specialist Stop Time (ACUTE ONLY) 1104  Mobility Specialist Time Calculation (min) (ACUTE ONLY) 20 min   Pt received in bed and agreeable to mobility. Prior to ambulating, pt requested assistance to bathroom. After ambulating ~75ft pt c/o feeling dizzy but stated she was okay to ambulate back to room. Pt required modA assistance back into bed d/t feeling dizziness. No other complaints during session. Pt to bed after session with all needs met. RN in room.   [facility]

## 2022-12-25 NOTE — Progress Notes (Signed)
Daily Progress Note   Patient Name: Alexandria Mclaughlin       Date: 12/25/2022 DOB: Oct 03, 1940  Age: 82 y.o. MRN#: [id number] Attending Physician: Gillis Santa, MD Primary Care Physician: Artis Delay, MD Admit Date: 12/19/2022 Length of Stay: 4 days  Reason for Consultation/Follow-up: Pain control  Subjective:   CC: Patient is awake alert resting in bed.  She did have a bowel movement.  She does state that the pain has improved.  She asked how long the effect from pain relief from superior hypogastric nerve block will remain. Following up regarding complex medical decision making.   Subjective:  Reviewed EMR prior to presenting to bedside.  Medication history reviewed, s/p superior hypogastric nerve block on 12-23-22.    Review of Systems RLQ pain-improving Objective:   Vital Signs:  BP (!) 117/48 (BP Location: Right Arm)   Pulse 73   Temp 98.6 F (37 C) (Oral)   Resp 18   Ht 5\' 3"  (1.6 m)   Wt 69.9 kg   SpO2 93%   BMI 27.30 kg/m   Physical Exam: General: NAD, alert, chronically ill-appearing, laying in bed HENT: moist mucous membranes Cardiovascular: RRR Respiratory: no increased work of breathing noted, not in respiratory distress Neuro: awake, interactive;  Psych: appropriately answers all questions  Imaging: I personally reviewed recent imaging.   Assessment & Plan:   Assessment: Patient is an 82 year old female with a past medical history of metastatic ovarian cancer with peritoneal carcinomatosis as well as metastatic disease to lumbar and thoracic spine, degenerative disc disease with prior spinal fusion, CVA, on 12/19/2022 from [facility] clinic for management of severe right lower quadrant pain. Imaging obtained on admission notes new small ascites in the abdomen and pelvis. Patient has known right ovarian mass with spine mets and prior compression fracture. Palliative medicine team consulted to assist with complex medical decision making.   Recommendations/Plan: #  Complex medical decision making/goals of care:   - Participate with PT, potentially go to rehab, eventually transition home with son.                 -  Code Status: Limited: Do not attempt resuscitation (DNR) -DNR-LIMITED -Do Not Intubate/DNI     # Symptom management                -Pain, in setting of metastatic ovarian cancer                              -Continue Tylenol scheduled 1000 mg every 8 hours during the day -Continue duloxetine 60 mg daily -Continue pregabalin 200 mg 2 times daily; hospitalist adjusted on 10/28 -Continue fentanyl patch to 75 mcg/h every 72 hours  Continue oral dilaudid to 4mg  q3hrs prn  -Change IV Dilaudid to 0.5 mg every 3 hours as needed for breakthrough management after oral medications.   IR performed superior hypogastric plexus nerve block for symptom management.                   -Anxiety                               -Continue home medication of clonazepam 5 mg nightly                  -Constipation                               -  Continue MiraLAX 17 g twice daily                               -Continue senna 2 tabs daily                               -Continue bisacodyl suppository prn   -In note having BM, would recommend enema   # Psychosocial Support:  -Patient lives at home with son, Alexandria Mclaughlin.   # Discharge Planning: To Be Determined  Nursing facility rehabilitation attempted with palliative services following.  Recommend palliative care following at Dr. Bertis Ruddy office at [facility] as well.  Discussed with: patient  Thank you for allowing the palliative care team to participate in the care [name] MDM Rosalin Hawking MD Palliative Care Provider PMT # [phone]  If patient remains symptomatic despite maximum doses, please call PMT at [phone] between 0700 and 1900. Outside of these hours, please call attending, as PMT does not have night coverage.  *Please note that this is a verbal dictation therefore any  spelling or grammatical errors are due to the "Dragon Medical One" system interpretation.

## 2022-12-25 NOTE — Plan of Care (Signed)

## 2022-12-25 NOTE — Telephone Encounter (Signed)
I canceled all her appt today Steward Drone, please inform infusion room I placed her chemo on hold pending further notice from the patient when she is ready to resume

## 2022-12-25 NOTE — Progress Notes (Signed)
PROGRESS NOTE    Alexandria Mclaughlin  NWG:[id number] DOB: 15-Aug-1940 DOA: 12/19/2022 PCP: Artis Delay, MD   Brief Narrative:  82 year old F with PMH of metastatic ovarian cancer with peritoneal carcinomatosis, lumbar arthritis spine mets, DDD, prior spinal fusion, CVA and mood disorder presenting from PM and R clinic due to progressive RLQ pain for few weeks and related ambulatory dysfunction due to pain, and admitted for pain control.  Palliative medicine following.  Assessment & Plan:   Principal Problem:   Ovarian cancer (HCC) Active Problems:   Cancer associated pain   High risk medication use   Anxiety   Constipation   Medication management   Palliative care encounter   Cancer-related breakthrough pain   Need for emotional support   Counseling and coordination of care   Diffuse intractable abdominal pain, uncontrolled Advancing metastatic ovarian cancer -Palliative care following, continue increased pain regimen as tolerated - current regimen is scheduled tylenol, duloxetine, pregabalin, fentanyl patch, oral dilaudid prn, iv dilaudid for breakthrough pain - s/p superior hypogastric plexus block with IR on 10/29, patient reports modest improvement in pain since  Acute metabolic versus toxic encephalopathy, likely polypharmacy  -Patient admits to occasional hallucinations overnight in the setting of increased narcotic use -Unfortunately patient's pain continues to be poorly controlled, would not wean narcotics at this time although would attempt to transition from IV to p.o. to decrease risk of hospital delirium/encephalopathy - currently lucid  Metastatic ovarian cancer with peritoneal carcinomatosis, thoracic and lumbar spine mets -Palliative/symptomatic management as above   Anemia of chronic disease/leukopenia:  In the setting of malignancy   Physical deconditioning/ambulatory dysfunction, acute on chronic:  -PT advising snf and patient desires that, bed search underway    Hx stroke: continue home statin  Mood d/o: continue home duloxetine  Hyperthyroidism: Continue home methimazole   Body mass index is 27.3 kg/m.  DVT prophylaxis: SCDs Start: 12/19/22 2227 Code Status:   Code Status: Limited: Do not attempt resuscitation (DNR) -DNR-LIMITED -Do Not Intubate/DNI  Family Communication: daughter updated telephonically 10/30  Status is: Inpatient  Dispo: The patient is from: Home              Anticipated d/c is to: SNF              Anticipated d/c date is: tbd  Consultants:  Palliative care  Procedures:  IR nerve block  Antimicrobials:  None  Subjective: No significant events overnight, patient's pain is under control at rest, patient stated that her pain usually gets worse when she walks and tries to do anything.  She has not walked yet today so unable to tell me how is her pain.   Objective: Vitals:   12/24/22 1600 12/24/22 2026 12/25/22 0537 12/25/22 1305  BP:  (!) 98/51 114/61 (!) 117/48  Pulse:  70 81 73  Resp:  16 16 18   Temp:  98.5 F (36.9 C) (!) 97.5 F (36.4 C) 98.6 F (37 C)  TempSrc:  Oral Oral Oral  SpO2: (!) 86% 98% 92% 93%  Weight:      Height:       No intake or output data in the 24 hours ending 12/25/22 1446  Filed Weights   12/20/22 0056  Weight: 69.9 kg    Examination:  General:  Pleasantly resting in bed, No acute distress. HEENT:  Normocephalic atraumatic.  . Lungs:  Clear to auscultate bilaterally without rhonchi, wheeze, or rales. Heart:  Regular rate and rhythm.  Without murmurs, rubs, or gallops. Abdomen:  Soft, mild tenderness lower quadrants Extremities: Without cyanosis, clubbing, edema, or obvious deformity. Skin:  Warm and dry, no erythema. Pale   Data Reviewed: I have personally reviewed following labs and imaging studies  CBC: Recent Labs  Lab 12/19/22 1711 12/20/22 0657 12/21/22 0500 12/23/22 0345  WBC 3.2* 2.9* 3.8* 4.5  NEUTROABS 1.8  --   --  2.3  HGB 9.2* 8.5* 9.6* 9.4*  HCT  29.4* 27.9* 31.3* 30.7*  MCV 101.0* 103.3* 102.6* 101.0*  PLT 215 204 234 254   Basic Metabolic Panel: Recent Labs  Lab 12/19/22 1711 12/20/22 0657 12/21/22 0500  NA 137 138 139  K 3.8 3.7 3.9  CL 101 101 101  CO2 27 29 28   GLUCOSE 109* 101* 115*  BUN 12 13 13   CREATININE 0.46 0.49 0.51  CALCIUM 9.2 8.8* 9.4  MG  --  1.9 2.0  PHOS  --  3.9 4.1   GFR: Estimated Creatinine Clearance: 50.8 mL/min (by C-G formula based on SCr of 0.51 mg/dL). Liver Function Tests: Recent Labs  Lab 12/19/22 1711 12/21/22 0500  AST 13*  --   ALT 9  --   ALKPHOS 104  --   BILITOT 0.5  --   PROT 6.3*  --   ALBUMIN 2.9* 2.8*   Recent Labs  Lab 12/19/22 1711  LIPASE 21   Anemia Panel: No results for input(s): "VITAMINB12", "FOLATE", "FERRITIN", "TIBC", "IRON", "RETICCTPCT" in the last 72 hours.  No results found for this or any previous visit (from the past 240 hour(s)).   Radiology Studies: IR BLOCK INJ HYPOGASTRIC PLEXUS  Result Date: 12/23/2022 CLINICAL DATA:  82 year old female with advanced stage ovarian cancer with retractable pelvic pain. EXAM: Fluoroscopic guided superior hypogastric nerve block. ANESTHESIA/SEDATION: Moderate (conscious) sedation was employed during this procedure. A total of Versed 1 mg and Fentanyl 50 mcg was administered intravenously. Moderate Sedation Time: 10 minutes. The patient's level of consciousness and vital signs were monitored continuously by radiology nursing throughout the procedure under my direct supervision. MEDICATIONS: 80 mg Depo-Medrol, 8 mL 0.5% bupivacaine CONTRAST:  2mL OMNIPAQUE IOHEXOL 300 MG/ML  SOLN COMPLICATIONS: None immediate PROCEDURE: Fluoroscopic guided superior hypogastric nerve block. FINDINGS: The patient was positioned supine on the IR table. The lower abdomen was prepped and draped in standard fashion. The procedure was planned. Under direct fluoroscopic visualization, a 22 gauge Chiba needle was directed to the anterior aspect of  the L5 vertebral body. 1% lidocaine was injected which flowed freely without resistance. Contrast was injected which demonstrated opacification in the region of the superior hypogastric nerve without evidence of vascular opacification. Next, a solution of 80 mg Depo-Medrol and 8 mL 0.5% bupivacaine was administered. The needle was then removed. The patient tolerated procedure well was transferred back to the floor in good condition. IMPRESSION: Technically successful fluoroscopic guided superior hypogastric nerve block with local anesthetic and steroid. Marliss Coots, MD [facility] Electronically Signed   By: Marliss Coots M.D.   On: 12/23/2022 16:56    Scheduled Meds:  acetaminophen  1,000 mg Oral Q8H   Chlorhexidine Gluconate Cloth  6 each Topical Daily   DULoxetine  60 mg Oral Daily   fentaNYL  1 patch Transdermal Q72H   irbesartan  150 mg Oral Daily   methimazole  5 mg Oral Once per day on Monday Wednesday Friday   pantoprazole  40 mg Oral Daily   polyethylene glycol  17 g Oral BID   pregabalin  200  mg Oral BID   senna  2 tablet Oral BID   traZODone  100 mg Oral QHS   Continuous Infusions:   LOS: 4 days   Time spent: 35 min  Gillis Santa, MD [facility]  If 7PM-7AM, please contact night-coverage [email or web address]  12/25/2022, 2:46 PM

## 2022-12-25 NOTE — Plan of Care (Signed)
  Problem: Education: Goal: Knowledge of General Education information will improve Description: Including pain rating scale, medication(s)/side effects and non-pharmacologic comfort measures Outcome: Progressing   Problem: Health Behavior/Discharge Planning: Goal: Ability to manage health-related needs will improve Outcome: Progressing   Problem: Clinical Measurements: Goal: Ability to maintain clinical measurements within normal limits will improve Outcome: Progressing Goal: Will remain free from infection Outcome: Progressing   Problem: Activity: Goal: Risk for activity intolerance will decrease Outcome: Progressing   Problem: Nutrition: Goal: Adequate nutrition will be maintained Outcome: Progressing   Problem: Coping: Goal: Level of anxiety will decrease Outcome: Progressing   Problem: Elimination: Goal: Will not experience complications related to bowel motility Outcome: Progressing Goal: Will not experience complications related to urinary retention Outcome: Progressing   Problem: Pain Management: Goal: General experience of comfort will improve Outcome: Progressing   Problem: Safety: Goal: Ability to remain free from injury will improve Outcome: Progressing   Problem: Safety: Goal: Ability to remain free from injury will improve Outcome: Progressing   Problem: Skin Integrity: Goal: Risk for impaired skin integrity will decrease Outcome: Progressing

## 2022-12-25 NOTE — TOC Progression Note (Addendum)
Transition of Care [facility]) - Progression Note    Patient Details  Name: Alexandria Mclaughlin MRN: [id number] Date of Birth: [birth year]  Transition of Care [facility]) CM/SW Contact  Beckie Busing, RN Phone Number:[phone]  12/25/2022, 11:17 AM  Clinical Narrative:    CM at bedside for patient choice for facility. Patient has accepted bed offer from [facility]. Message has been left for [employer] intake for [facility]. CM will await return call.   1535 CM spoke with [place] at [facility]. Facility can accept patient. Insurance Berkley Harvey has been initiated [employer] ID [id number], customer ID  [phone] certification # [phone]   Expected Discharge Plan: Home w Home Health Services Barriers to Discharge: Continued Medical Work up  Expected Discharge Plan and Services   Discharge Planning Services: CM Consult Post Acute Care Choice: Home Health Living arrangements for the past 2 months: Apartment                           HH Arranged: PT HH Agency: [facility] Date [facility] Agency Contacted: 12/21/22 Time HH Agency Contacted: [phone] Representative spoke with at [facility] Agency: Kandee Keen   Social Determinants of Health (SDOH) Interventions SDOH Screenings   Food Insecurity: No Food Insecurity (12/21/2022)  Housing: Patient Declined (12/21/2022)  Transportation Needs: No Transportation Needs (12/21/2022)  Utilities: Not At Risk (12/21/2022)  Depression (PHQ2-9): Low Risk  (11/19/2022)  Social Connections: Unknown (07/04/2021)   Received from [facility]  Tobacco Use: Medium Risk (12/20/2022)    Readmission Risk Interventions     No data to display

## 2022-12-26 DIAGNOSIS — C561 Malignant neoplasm of right ovary: Secondary | ICD-10-CM | POA: Diagnosis not present

## 2022-12-26 MED ORDER — VITAMIN C 500 MG PO TABS
500.0000 mg | ORAL_TABLET | Freq: Every day | ORAL | Status: DC
  Administered 2022-12-26 – 2022-12-27 (×2): 500 mg via ORAL
  Filled 2022-12-26 (×2): qty 1

## 2022-12-26 MED ORDER — HYDROMORPHONE HCL 4 MG PO TABS
8.0000 mg | ORAL_TABLET | ORAL | Status: DC | PRN
  Administered 2022-12-26 – 2022-12-27 (×3): 8 mg via ORAL
  Filled 2022-12-26 (×3): qty 2

## 2022-12-26 MED ORDER — POLYSACCHARIDE IRON COMPLEX 150 MG PO CAPS
150.0000 mg | ORAL_CAPSULE | Freq: Every day | ORAL | Status: DC
  Administered 2022-12-26 – 2022-12-27 (×2): 150 mg via ORAL
  Filled 2022-12-26 (×2): qty 1

## 2022-12-26 MED ORDER — HYDROMORPHONE HCL 4 MG PO TABS: 6.0000 mg | ORAL_TABLET | ORAL | Status: DC | PRN

## 2022-12-26 NOTE — Progress Notes (Signed)
PROGRESS NOTE    Alexandria Mclaughlin  KGM:[id number] DOB: [birth year] DOA: 12/19/2022 PCP: Artis Delay, MD   Brief Narrative:  82 year old F with PMH of metastatic ovarian cancer with peritoneal carcinomatosis, lumbar arthritis spine mets, DDD, prior spinal fusion, CVA and mood disorder presenting from PM and R clinic due to progressive RLQ pain for few weeks and related ambulatory dysfunction due to pain, and admitted for pain control.  Palliative medicine following.  Assessment & Plan:   Principal Problem:   Ovarian cancer (HCC) Active Problems:   Cancer associated pain   High risk medication use   Anxiety   Constipation   Medication management   Palliative care encounter   Cancer-related breakthrough pain   Need for emotional support   Counseling and coordination of care   Diffuse intractable abdominal pain, uncontrolled Advancing metastatic ovarian cancer -Palliative care following, continue increased pain regimen as tolerated - current regimen is scheduled tylenol, duloxetine, pregabalin, fentanyl patch, oral dilaudid prn, iv dilaudid for breakthrough pain - s/p superior hypogastric plexus block with IR on 10/29, patient reports modest improvement in pain since 11/1/pain 7/10, ambulating with walker.  Increased Dilaudid 8 mg p.o. every 4 hourly Patient does not feel comfortable going home today, she declined SNF placement.  Acute metabolic versus toxic encephalopathy, likely polypharmacy  -Patient admits to occasional hallucinations overnight in the setting of increased narcotic use -Unfortunately patient's pain continues to be poorly controlled, would not wean narcotics at this time although would attempt to transition from IV to p.o. to decrease risk of hospital delirium/encephalopathy - currently lucid  Metastatic ovarian cancer with peritoneal carcinomatosis, thoracic and lumbar spine mets -Palliative/symptomatic management as above   Anemia of chronic disease/leukopenia:   In the setting of malignancy   Physical deconditioning/ambulatory dysfunction, acute on chronic:  -PT advising snf and patient desires that, bed search underway   Hx stroke: continue home statin  Mood d/o: continue home duloxetine  Hyperthyroidism: Continue home methimazole   Body mass index is 27.3 kg/m.  DVT prophylaxis: SCDs Start: 12/19/22 2227 Code Status:   Code Status: Limited: Do not attempt resuscitation (DNR) -DNR-LIMITED -Do Not Intubate/DNI  Family Communication: daughter updated telephonically 10/30  Status is: Inpatient  Dispo: The patient is from: Home              Anticipated d/c is to: [facility] with [facility] tomorrow a.m.              Anticipated d/c date is: Tomorrow a.m.  Consultants:  Palliative care  Procedures:  IR nerve block  Antimicrobials:  None  Subjective: No significant events overnight, patient/significant pain 7/10 in the lower abdomen, she was ambulating with walker and standby assist. Patient was thinking not to go to rehab and planning to go home, patient does not feel comfortable going home today, we will reassess tomorrow a.m. Increased dose of pain medications.   Objective: Vitals:   12/25/22 2030 12/26/22 0600 12/26/22 1023 12/26/22 1353  BP: (!) 133/57 (!) 107/59 (!) 153/79 (!) 140/69  Pulse: 79 68 75 84  Resp: 14 16  16   Temp: 98.6 F (37 C) 98.2 F (36.8 C)  98.1 F (36.7 C)  TempSrc: Oral Oral  Oral  SpO2: 90% 90%  92%  Weight:      Height:        Intake/Output Summary (Last 24 hours) at 12/26/2022 1551 Last data filed at 12/26/2022 1300 Gross per 24 hour  Intake 817 ml  Output --  Net 817  ml    Filed Weights   12/20/22 0056  Weight: 69.9 kg    Examination:  General:  Pleasantly resting in bed, No acute distress. HEENT:  Normocephalic atraumatic.  . Lungs:  Clear to auscultate bilaterally without rhonchi, wheeze, or rales. Heart:  Regular rate and rhythm.  Without murmurs, rubs, or gallops. Abdomen:  Soft, mild  tenderness lower quadrants Extremities: Without cyanosis, clubbing, edema, or obvious deformity. Skin:  Warm and dry, no erythema. Pale   Data Reviewed: I have personally reviewed following labs and imaging studies  CBC: Recent Labs  Lab 12/19/22 1711 12/20/22 0657 12/21/22 0500 12/23/22 0345  WBC 3.2* 2.9* 3.8* 4.5  NEUTROABS 1.8  --   --  2.3  HGB 9.2* 8.5* 9.6* 9.4*  HCT 29.4* 27.9* 31.3* 30.7*  MCV 101.0* 103.3* 102.6* 101.0*  PLT 215 204 234 254   Basic Metabolic Panel: Recent Labs  Lab 12/19/22 1711 12/20/22 0657 12/21/22 0500  NA 137 138 139  K 3.8 3.7 3.9  CL 101 101 101  CO2 27 29 28   GLUCOSE 109* 101* 115*  BUN 12 13 13   CREATININE 0.46 0.49 0.51  CALCIUM 9.2 8.8* 9.4  MG  --  1.9 2.0  PHOS  --  3.9 4.1   GFR: Estimated Creatinine Clearance: 50.8 mL/min (by C-G formula based on SCr of 0.51 mg/dL). Liver Function Tests: Recent Labs  Lab 12/19/22 1711 12/21/22 0500  AST 13*  --   ALT 9  --   ALKPHOS 104  --   BILITOT 0.5  --   PROT 6.3*  --   ALBUMIN 2.9* 2.8*   Recent Labs  Lab 12/19/22 1711  LIPASE 21   Anemia Panel: No results for input(s): "VITAMINB12", "FOLATE", "FERRITIN", "TIBC", "IRON", "RETICCTPCT" in the last 72 hours.  No results found for this or any previous visit (from the past 240 hour(s)).   Radiology Studies: No results found.  Scheduled Meds:  acetaminophen  1,000 mg Oral Q8H   ascorbic acid  500 mg Oral Daily   Chlorhexidine Gluconate Cloth  6 each Topical Daily   DULoxetine  60 mg Oral Daily   fentaNYL  1 patch Transdermal Q72H   irbesartan  150 mg Oral Daily   iron polysaccharides  150 mg Oral Daily   methimazole  5 mg Oral Once per day on Monday Wednesday Friday   pantoprazole  40 mg Oral Daily   polyethylene glycol  17 g Oral BID   pregabalin  200 mg Oral BID   senna  2 tablet Oral BID   traZODone  100 mg Oral QHS   Continuous Infusions:   LOS: 5 days   Time spent: 35 min  Gillis Santa, MD [facility]  If 7PM-7AM, please contact night-coverage [email or web address]  12/26/2022, 3:51 PM

## 2022-12-26 NOTE — Progress Notes (Signed)
PMT no charge note.   Chart reviewed, TOC note reviewed, medication history noted.  Plan is for SNF rehab with palliative, continue current pain and non pain regimen.  No charge Rosalin Hawking MD [facility].

## 2022-12-26 NOTE — Progress Notes (Signed)
Physical Therapy Treatment Patient Details Name: Alexandria Mclaughlin MRN: [id number] DOB: [birth year] Today's Date: 12/26/2022   History of Present Illness patient is a 82 y.o. female  who was brought in from clinic due to subacute on chronic progressive RLQ pain suspect related to her R sided ovarian mass.PMH: metastatic ovarian cancer with peritoneal carcinomatosis, mets to lumbar and thoracic spine, degenerative disc disease, prior spinal fusion, CVA, mood disorder,    PT Comments  Pt is slowly progressing toward acute PT goals with further ambulation distance this date. Pt required up to MIN A for all nobility and ambulated ~83ft following transfer to [facility]. Still limited by increased pain, decreased activity tolerance, and generalized muscle weakness. Per chart review, pt stating that she is interested in short term rehab upon d/c- today pt reporting to therapist and MD that she thinks she would prefer to go home with increased assist from her daughter and son. Pt to have further conversation with them and come up with schedule. Pt also reports that she plans to buy [facility] for use at home. Continue to recommend d/c for continued inpatient follow up therapy, <3 hours/day. Pt will benefit from continued skilled PT services to increase their independence and maximize safety with mobility.      If plan is discharge home, recommend the following: A little help with walking and/or transfers;A little help with bathing/dressing/bathroom;Assistance with cooking/housework;Assist for transportation;Help with stairs or ramp for entrance   Can travel by private vehicle        Equipment Recommendations  None recommended by PT    Recommendations for Other Services       Precautions / Restrictions Precautions Precautions: Fall Precaution Comments: back precautions for comfort Restrictions Weight Bearing Restrictions: No     Mobility  Bed Mobility Overal bed mobility: Needs Assistance Bed Mobility: Supine to  Sit, Sit to Supine     Supine to sit: Supervision, HOB elevated, Used rails Sit to supine: Contact guard assist, HOB elevated, Used rails   General bed mobility comments: use of bed rails, pt reports she sleeps in recliner at home. Increased time    Transfers Overall transfer level: Needs assistance Equipment used: Rolling walker (2 wheels) Transfers: Sit to/from Stand, Bed to chair/wheelchair/BSC Sit to Stand: Contact guard assist   Step pivot transfers: Contact guard assist       General transfer comment: able to step to [facility] from EOB with CGA    Ambulation/Gait Ambulation/Gait assistance: Contact guard assist Gait Distance (Feet): 50 Feet Assistive device: Rolling walker (2 wheels) Gait Pattern/deviations: Step-through pattern, Decreased stride length, Trunk flexed Gait velocity: decr     General Gait Details: cues for upright posture and close proximty to RW. Reprots increased fatigue and feeling "weak"   Stairs             Wheelchair Mobility     Tilt Bed    Modified Rankin (Stroke Patients Only)       Balance Overall balance assessment: Mild deficits observed, not formally tested                                          Cognition Arousal: Alert Behavior During Therapy: WFL for tasks assessed/performed Overall Cognitive Status: Within Functional Limits for tasks assessed  Exercises      General Comments        Pertinent Vitals/Pain Pain Assessment Pain Assessment: 0-10 Pain Score: 7  Pain Location: abdomen Pain Descriptors / Indicators: Grimacing, Discomfort, Constant Pain Intervention(s): Limited activity within patient's tolerance, Monitored during session, Premedicated before session, RN gave pain meds during session    Home Living                          Prior Function            PT Goals (current goals can now be found in the care plan  section) Acute Rehab PT Goals Patient Stated Goal: Go home and get stronger PT Goal Formulation: With patient Time For Goal Achievement: 01/03/23 Potential to Achieve Goals: Good Progress towards PT goals: Progressing toward goals    Frequency    Min 1X/week      PT Plan      Co-evaluation              AM-PAC PT "6 Clicks" Mobility   Outcome Measure  Help needed turning from your back to your side while in a flat bed without using bedrails?: A Little Help needed moving from lying on your back to sitting on the side of a flat bed without using bedrails?: A Lot Help needed moving to and from a bed to a chair (including a wheelchair)?: A Little Help needed standing up from a chair using your arms (e.g., wheelchair or bedside chair)?: A Little Help needed to walk in hospital room?: A Little Help needed climbing 3-5 steps with a railing? : A Lot 6 Click Score: 16    End of Session Equipment Utilized During Treatment: Gait belt Activity Tolerance: Patient limited by fatigue Patient left: in bed;with call bell/phone within reach;with bed alarm set (pt requesting back to bed vs. chair) Nurse Communication: Mobility status PT Visit Diagnosis: Muscle weakness (generalized) (M62.81)     Time: 8756-4332 PT Time Calculation (min) (ACUTE ONLY): 17 min  Charges:    $Therapeutic Activity: 8-22 mins PT General Charges $$ ACUTE PT VISIT: 1 Visit                     Lyman Speller PT, DPT  Acute Rehabilitation Services  Office [phone]   12/26/2022, 1:45 PM

## 2022-12-26 NOTE — TOC Progression Note (Signed)
Transition of Care [facility]) - Progression Note    Patient Details  Name: Alexandria Mclaughlin MRN: [id number] Date of Birth: 04-Jan-1941  [facility]) CM/SW Contact  Beckie Busing, RN Phone Number:[phone]  12/26/2022, 1:56 PM  Clinical Narrative:    CM received call from [facility] stating that patient has been approved for SNF. Approval info has been faxed and is on chart. CM spoke with Whitney at [facility] who confirms that facility has a bed available. CM at bedside to update patient and patient states that she does not want to go to SNF now. Patient wants to go home. CM has sent MD a message to inquire if patient is medically stable for discharge in order to move forward with disposition plan. MD states that patient is here for pain and he will need to ask patient how she is feeling. CM spoke with son [name] who states that he is ok with the patient returning home and that she lives with him. Son states that he is currently at work but can pick patient up later in the day. TOC will await follow up from MD.    Expected Discharge Plan: Home w Home Health Services Barriers to Discharge: Continued Medical Work up  Expected Discharge Plan and Services   Discharge Planning Services: CM Consult Post Acute Care Choice: Home Health Living arrangements for the past 2 months: Apartment                           HH Arranged: PT HH Agency: [facility] Date [facility] Agency Contacted: 12/21/22 Time HH Agency Contacted: [phone] Representative spoke with at [facility] Agency: Kandee Keen   Social Determinants of Health (SDOH) Interventions SDOH Screenings   Food Insecurity: No Food Insecurity (12/21/2022)  Housing: Patient Declined (12/21/2022)  Transportation Needs: No Transportation Needs (12/21/2022)  Utilities: Not At Risk (12/21/2022)  Depression (PHQ2-9): Low Risk  (11/19/2022)  Social Connections: Unknown (07/04/2021)   Received from [facility]  Tobacco Use: Medium  Risk (12/20/2022)    Readmission Risk Interventions     No data to display

## 2022-12-27 ENCOUNTER — Other Ambulatory Visit: Payer: Self-pay | Admitting: Student

## 2022-12-27 DIAGNOSIS — C561 Malignant neoplasm of right ovary: Secondary | ICD-10-CM | POA: Diagnosis not present

## 2022-12-27 MED ORDER — HEPARIN SOD (PORK) LOCK FLUSH 100 UNIT/ML IV SOLN
500.0000 [IU] | Freq: Once | INTRAVENOUS | Status: AC
  Administered 2022-12-27: 500 [IU] via INTRAVENOUS
  Filled 2022-12-27: qty 5

## 2022-12-27 MED ORDER — PANTOPRAZOLE SODIUM 40 MG PO TBEC: 40.0000 mg | DELAYED_RELEASE_TABLET | Freq: Every day | ORAL | 0 refills | Status: DC

## 2022-12-27 MED ORDER — ASCORBIC ACID 500 MG PO TABS: 500.0000 mg | ORAL_TABLET | Freq: Every day | ORAL | 2 refills | Status: DC

## 2022-12-27 MED ORDER — HYDROMORPHONE HCL 8 MG PO TABS: 8.0000 mg | ORAL_TABLET | Freq: Four times a day (QID) | ORAL | 0 refills | Status: DC | PRN

## 2022-12-27 MED ORDER — ACETAMINOPHEN 325 MG PO TABS: 650.0000 mg | ORAL_TABLET | Freq: Three times a day (TID) | ORAL | 3 refills | Status: DC | PRN

## 2022-12-27 MED ORDER — FENTANYL 75 MCG/HR TD PT72: 1.0000 | MEDICATED_PATCH | TRANSDERMAL | 0 refills | Status: DC

## 2022-12-27 MED ORDER — POLYSACCHARIDE IRON COMPLEX 150 MG PO CAPS: 150.0000 mg | ORAL_CAPSULE | Freq: Every day | ORAL | 2 refills | Status: DC

## 2022-12-27 NOTE — Progress Notes (Unsigned)
Hydrocodone prescription resent to [facility]

## 2022-12-27 NOTE — Discharge Summary (Signed)
[facility] Discharge Summary   Patient: Alexandria Mclaughlin ZOX:[id number]  PCP: Artis Delay, MD  Date of admission: 12/19/2022   Date of discharge:  12/27/2022     Discharge Diagnoses:  Principal Problem:   Ovarian cancer [facility]) Active Problems:   Cancer associated pain   High risk medication use   Anxiety   Constipation   Medication management   Palliative care by specialist   Cancer-related breakthrough pain   Need for emotional support   Counseling and coordination of care   Admitted From: Home Disposition:  Home with [facility]  Recommendations for Outpatient Follow-up:  Follow with PCP in 1 week Follow-up with pain management in 1 week Follow up LABS/TEST:     Follow-up Information     [facility], [facility] Follow up.   Specialty: Home Health Services Contact information: [address] [place] [address] [phone]         Artis Delay, MD Follow up in 1 week(s).   Specialty: Hematology and Oncology Contact information: [address] [place] [address] [phone]                Diet recommendation: Cardiac diet  Activity: The patient is advised to gradually reintroduce usual activities, as tolerated  Discharge Condition: stable  Code Status: Full code   History of present illness: As per the H and P dictated on admission Hospital Course:  82 year old F with PMH of metastatic ovarian cancer with peritoneal carcinomatosis, lumbar arthritis spine mets, DDD, prior spinal fusion, CVA and mood disorder presenting from PM and R clinic due to progressive RLQ pain for few weeks and related ambulatory dysfunction due to pain, and admitted for pain control. Palliative medicine following.   Assessment & Plan:  # Diffuse intractable abdominal pain, uncontrolled Advancing metastatic ovarian cancer Palliative care consulted, continue increased pain regimen as tolerated. S/p scheduled tylenol, duloxetine, pregabalin, fentanyl  patch, oral dilaudid prn, iv dilaudid for breakthrough pain.  s/p superior hypogastric plexus block with IR on 10/29, patient reports modest improvement in pain since 11/1/pain 7/10, ambulating with walker.  Increased Dilaudid 8 mg p.o. every 4 hourly. Patient did not feel comfortable going home. she declined SNF placement.  Today patient is feeling improvement, pain is 4/10 and feels comfortable going home.   # Acute metabolic versus toxic encephalopathy, likely polypharmacy: Patient admits to occasional hallucinations overnight in the setting of increased narcotic use Unfortunately patient's pain continues to be poorly controlled, would not wean narcotics at this time although would attempt to transition from IV to p.o. to decrease risk of hospital delirium/encephalopathy.  Currently patient is back to her baseline, stable to discharge home.  Follow-up with PCP.  # Metastatic ovarian cancer with peritoneal carcinomatosis, thoracic and lumbar spine mets -Palliative/symptomatic management as above   # Anemia of chronic disease/leukopenia:  In the setting of malignancy   # Physical deconditioning/ambulatory dysfunction, acute on chronic: PT/OT eval done, recommended SNF placement but patient declined so home health was arranged by [facility] and patient was discharged home.  # Hx stroke: continue home statin  # Mood d/o: continue home duloxetine  # Hyperthyroidism: Continue home methimazole    Body mass index is 27.3 kg/m.  Nutrition Interventions:  Pain control  - [place] Controlled Substance Reporting System database could not be reviewed, website was not working.   -Dilaudid 30 tablets prescribed and fentanyl patch prescribed. - Patient was instructed, not to drive, operate heavy machinery, perform activities at heights, swimming  or participation in water activities or provide baby sitting services while on Pain, Sleep and Anxiety Medications; until her outpatient Physician has advised to  do so again.  - Also recommended to not to take more than prescribed Pain, Sleep and Anxiety Medications.  Patient was seen by physical therapy, who recommended placement which patient declined, so discharged home with home health arrangement.   On the day of the discharge the patient's vitals were stable, and no other acute medical condition were reported by patient. the patient was felt safe to be discharge at Home with Home health.  Consultants: Palliative care and IR Procedures: s/p superior hypogastric plexus block with IR on 10/29  Discharge Exam: General: Appear in no distress, no Rash; Oral Mucosa Clear, moist. Cardiovascular: S1 and S2 Present, no Murmur, Respiratory: normal respiratory effort, Bilateral Air entry present and no Crackles, no wheezes Abdomen: Bowel Sound present, Soft and no tenderness, no hernia Extremities: no Pedal edema, no calf tenderness Neurology: alert and oriented to time, place, and person affect appropriate.  Filed Weights   12/20/22 0056  Weight: 69.9 kg   Vitals:   12/26/22 2129 12/27/22 0612  BP: (!) 141/81 (!) 154/75  Pulse: 80 71  Resp: 18 18  Temp:  98.4 F (36.9 C)  SpO2: 92% 98%    DISCHARGE MEDICATION: Allergies as of 12/27/2022   No Known Allergies      Medication List     STOP taking these medications    fentaNYL 25 MCG/HR Commonly known as: DURAGESIC Replaced by: fentaNYL 75 MCG/HR   oxyCODONE-acetaminophen 10-325 MG tablet Commonly known as: Percocet       TAKE these medications    acetaminophen 325 MG tablet Commonly known as: TYLENOL Take 2 tablets (650 mg total) by mouth every 8 (eight) hours as needed for headache, fever or mild pain (pain score 1-3).   ascorbic acid 500 MG tablet Commonly known as: VITAMIN C Take 1 tablet (500 mg total) by mouth daily. Start taking on: December 28, 2022   clonazePAM 0.5 MG tablet Commonly known as: KlonoPIN Take 1 tablet (0.5 mg total) by mouth at bedtime as needed  for anxiety.   cyanocobalamin 1000 MCG tablet Take 1,000 mcg by mouth daily.   dexamethasone 4 MG tablet Commonly known as: DECADRON Take 2 tabs at the night before and 2 tab the morning of chemotherapy, every 3 weeks, by mouth x 6 cycles   DULoxetine 60 MG capsule Commonly known as: CYMBALTA TAKE 1 CAPSULE BY MOUTH EVERY DAY What changed: how much to take   fentaNYL 75 MCG/HR Commonly known as: DURAGESIC Place 1 patch onto the skin every 3 (three) days. Start taking on: December 29, 2022 Replaces: fentaNYL 25 MCG/HR   gabapentin 600 MG tablet Commonly known as: NEURONTIN TAKE 1 TABLET BY MOUTH FOUR TIMES A DAY   HYDROmorphone 8 MG tablet Commonly known as: DILAUDID Take 1 tablet (8 mg total) by mouth every 6 (six) hours as needed for severe pain (pain score 7-10) or moderate pain (pain score 4-6).   irbesartan 150 MG tablet Commonly known as: AVAPRO Take 150 mg by mouth daily.   iron polysaccharides 150 MG capsule Commonly known as: NIFEREX Take 1 capsule (150 mg total) by mouth daily. Start taking on: December 28, 2022   lactulose 10 GM/15ML solution Commonly known as: CHRONULAC TAKE 15 MLS (10 G TOTAL) BY MOUTH 3 (THREE) TIMES DAILY.   lidocaine-prilocaine cream Commonly known as: EMLA APPLY TO AFFECTED AREA ONCE  AS DIRECTED What changed: See the new instructions.   magnesium oxide 400 (240 Mg) MG tablet Commonly known as: MAG-OX TAKE 1 TABLET BY MOUTH EVERY DAY   Melatonin 10 MG Tabs Take 10 mg by mouth at bedtime as needed (sleep).   methimazole 5 MG tablet Commonly known as: TAPAZOLE Take 5 mg by mouth See admin instructions. 3 times a week only   ondansetron 8 MG tablet Commonly known as: Zofran Take 1 tablet (8 mg total) by mouth every 8 (eight) hours as needed for nausea or vomiting. Start on the third day after carboplatin.   pantoprazole 40 MG tablet Commonly known as: PROTONIX Take 1 tablet (40 mg total) by mouth daily for 14 days. Start  taking on: December 28, 2022   polyethylene glycol 17 g packet Commonly known as: MIRALAX / GLYCOLAX Take 17 g by mouth 2 (two) times daily.   pregabalin 50 MG capsule Commonly known as: Lyrica Take 1 capsule (50 mg total) by mouth 3 (three) times daily.   prochlorperazine 10 MG tablet Commonly known as: COMPAZINE TAKE 1 TABLET BY MOUTH EVERY 6 HOURS AS NEEDED FOR NAUSEA OR VOMITING. What changed: reasons to take this   rosuvastatin 40 MG tablet Commonly known as: CRESTOR Take 1 tablet (40 mg total) by mouth at bedtime.   senna 8.6 MG tablet Commonly known as: SENOKOT Take 2 tablets by mouth 2 (two) times daily.   traZODone 100 MG tablet Commonly known as: DESYREL TAKE 1 TABLET BY MOUTH EVERYDAY AT BEDTIME What changed: See the new instructions.   Vitamin D 50 MCG (2000 UT) Caps Take 2,000 Units by mouth daily.       No Known Allergies Discharge Instructions     Call MD for:  difficulty breathing, headache or visual disturbances   Complete by: As directed    Call MD for:  extreme fatigue   Complete by: As directed    Call MD for:  persistant dizziness or light-headedness   Complete by: As directed    Call MD for:  persistant nausea and vomiting   Complete by: As directed    Call MD for:  severe uncontrolled pain   Complete by: As directed    Call MD for:  temperature >100.4   Complete by: As directed    Diet - low sodium heart healthy   Complete by: As directed    Discharge instructions   Complete by: As directed    Follow with PCP in 1 week Follow-up with pain management in 1 week   Increase activity slowly   Complete by: As directed        The results of significant diagnostics from this hospitalization (including imaging, microbiology, ancillary and laboratory) are listed below for reference.    Significant Diagnostic Studies: IR BLOCK INJ HYPOGASTRIC PLEXUS  Result Date: 12/23/2022 CLINICAL DATA:  82 year old female with advanced stage ovarian  cancer with retractable pelvic pain. EXAM: Fluoroscopic guided superior hypogastric nerve block. ANESTHESIA/SEDATION: Moderate (conscious) sedation was employed during this procedure. A total of Versed 1 mg and Fentanyl 50 mcg was administered intravenously. Moderate Sedation Time: 10 minutes. The patient's level of consciousness and vital signs were monitored continuously by radiology nursing throughout the procedure under my direct supervision. MEDICATIONS: 80 mg Depo-Medrol, 8 mL 0.5% bupivacaine CONTRAST:  2mL OMNIPAQUE IOHEXOL 300 MG/ML  SOLN COMPLICATIONS: None immediate PROCEDURE: Fluoroscopic guided superior hypogastric nerve block. FINDINGS: The patient was positioned supine on the IR table. The lower abdomen was prepped  and draped in standard fashion. The procedure was planned. Under direct fluoroscopic visualization, a 22 gauge Chiba needle was directed to the anterior aspect of the L5 vertebral body. 1% lidocaine was injected which flowed freely without resistance. Contrast was injected which demonstrated opacification in the region of the superior hypogastric nerve without evidence of vascular opacification. Next, a solution of 80 mg Depo-Medrol and 8 mL 0.5% bupivacaine was administered. The needle was then removed. The patient tolerated procedure well was transferred back to the floor in good condition. IMPRESSION: Technically successful fluoroscopic guided superior hypogastric nerve block with local anesthetic and steroid. Marliss Coots, MD [facility] Radiology Electronically Signed   By: Marliss Coots M.D.   On: 12/23/2022 16:56   CT T-SPINE NO CHARGE  Result Date: 12/20/2022 CLINICAL DATA:  Study identified as missing a report at 4:13 am on 12/20/2022. 82 year old female with abdomen and spine pain.  Ovarian cancer. EXAM: CT THORACIC SPINE WITH CONTRAST TECHNIQUE: Multiplanar CT images of the thoracic spine were reconstructed from contemporary  CT of the Abdomen. RADIATION DOSE REDUCTION: This exam was performed according to the departmental dose-optimization program which includes automated exposure control, adjustment of the mA and/or kV according to patient size and/or use of iterative reconstruction technique. CONTRAST:  No additional COMPARISON:  CT Abdomen and Pelvis, lumbar spine CT from the same time reported separately. Restaging CT Chest, Abdomen, and Pelvis 10/15/2022. Outside thoracic spine MRI 02/22/2021. FINDINGS: Limited cervical spine imaging: Cervicothoracic junction alignment is within normal limits. Thoracic spine segmentation:  Normal. Alignment: Stable thoracic kyphosis. No significant scoliosis or spondylolisthesis. Vertebrae: Mild chronic superior endplate deformities at T2, T4, T6, T9 are all stable since the 2022 MRI. Background bone mineralization is stable. Small round sclerotic bone lesions in T9 and T10 appear new from the 2022 MRI and are both mildly larger since August (now up to 8 mm versus 4-5 mm previously). Smaller similar T5 and T6 inferior endplate sclerotic foci have also progressed Increased. No destructive osseous lesion in the thoracic spine. No acute pathologic fracture. Visible ribs appear intact. Lumbar spine is detailed separately. Paraspinal and other soft tissues: Stable right chest Port-A-Cath. Calcified aortic atherosclerosis. Calcified coronary artery atherosclerosis. No pericardial effusion. Lower lung volumes. Small new layering left and trace right pleural effusions. Dependent and lung base atelectasis. Major airways remain patent. Abdomen is reported separately. Thoracic paraspinal soft tissues remain normal. Disc levels: Mild for age thoracic spine degeneration superimposed on the above findings. Degenerative appearing posterior element ankylosis in the upper thoracic spine at least T3 through T6. No CT evidence of thoracic spinal stenosis. IMPRESSION: 1. Small but progressive sclerotic bone metastases  in thoracic vertebrae since August restaging CT. 2. No associated pathologic fracture. Multilevel mild chronic compression fractures in the thoracic spine are stable since 2022. 3. New small pleural effusions, pulmonary atelectasis. 4. Abdomen and Lumbar spine are detailed separately. Electronically Signed   By: Odessa Fleming M.D.   On: 12/20/2022 04:23   CT L-SPINE NO CHARGE  Result Date: 12/19/2022 CLINICAL DATA:  Back pain, lower extremity radicular pain EXAM: CT LUMBAR SPINE WITHOUT CONTRAST TECHNIQUE: Multidetector CT imaging of the lumbar spine was performed without intravenous contrast administration. Multiplanar CT image reconstructions were also generated. RADIATION DOSE REDUCTION: This exam was performed according to the departmental dose-optimization program which includes automated exposure control, adjustment of the mA and/or kV according to patient size and/or use of iterative reconstruction technique. COMPARISON:  None Available. FINDINGS: Segmentation: 5 lumbar  type vertebrae. Alignment: Normal. Vertebrae: L4-5 anterior posterior lumbar fusion with instrumentation again noted. Degenerative ankylosis of the anterior posterior elements of L5-S1 noted. Chronic L1 compression deformity with approximately 50% loss of height is unchanged. Sclerotic foci within the L2 and L5 vertebral bodies is again identified suspicious for sclerotic osseous metastases as these are stable since prior examination, but are new since remote prior examination of 03/17/2022. No acute fracture. Paraspinal and other soft tissues: Negative paraspinal soft tissues. Mass within the right hemipelvis is better assessed on accompanying CT examination of the abdomen and pelvis. Disc levels: Intervertebral disc space narrowing and endplate remodeling at L3-4 in keeping with changes of advanced degenerative disc disease. Remaining intervertebral disc heights are preserved. Eccentric left paracentral/foraminal disc bulge at L3-4 noted.  Facet hypertrophy results in mild left and moderate right neuroforaminal narrowing at this level. Streak artifact limits evaluation of the neural foramina and spinal canal at L4-5. Bilateral laminectomy and posterior decompression of L5 has been performed. No significant neuroforaminal narrowing or central canal stenosis at this level. IMPRESSION: 1. No acute fracture or listhesis of the lumbar spine. 2. Stable chronic L1 compression deformity with approximately 50% loss of height. 3. Stable sclerotic foci within the L2 and L5 vertebral bodies suspicious for sclerotic osseous metastases as these are stable since prior examination, but are new since remote prior examination of 03/17/2022. 4. L4-5 anterior posterior lumbar fusion with instrumentation. Degenerative ankylosis of the anterior posterior elements of L5-S1. 5. Advanced degenerative disease at L3-4 resulting in mild left and moderate right neuroforaminal narrowing at this level. Electronically Signed   By: Helyn Numbers M.D.   On: 12/19/2022 20:39   CT ABDOMEN PELVIS W CONTRAST  Result Date: 12/19/2022 CLINICAL DATA:  Abdominal pain EXAM: CT ABDOMEN AND PELVIS WITH CONTRAST TECHNIQUE: Multidetector CT imaging of the abdomen and pelvis was performed using the standard protocol following bolus administration of intravenous contrast. RADIATION DOSE REDUCTION: This exam was performed according to the departmental dose-optimization program which includes automated exposure control, adjustment of the mA and/or kV according to patient size and/or use of iterative reconstruction technique. CONTRAST:  OMNIPAQUE IOHEXOL 300 MG/ML  SOLN COMPARISON:  10/15/2022 FINDINGS: Lower chest: Trace left pleural effusion. Dependent atelectasis. Coronary artery and aortic atherosclerosis. Hepatobiliary: No focal hepatic abnormality. Gallbladder unremarkable. Pancreas: No focal abnormality or ductal dilatation. Spleen: No focal abnormality.  Normal size.  Adrenals/Urinary Tract: No adrenal abnormality. No focal renal abnormality. No stones or hydronephrosis. Urinary bladder is unremarkable. Stomach/Bowel: Stomach, large and small bowel grossly unremarkable. Vascular/Lymphatic: Aortic atherosclerosis. No evidence of aneurysm or adenopathy. Reproductive: Prior hysterectomy. Mixed solid and cystic mass in the right ovary again measuring 3.9 x 3.4 cm, compared to 3.9 x 3.5 cm previously, unchanged. Other: Small amount of free fluid adjacent to the liver, spleen and in the pelvis. This is new since prior study. Musculoskeletal: Sclerotic area in the posterior L2 vertebral body, 7 mm compared to 6 mm previously. 1.5 cm sclerotic posterior L5 lesion, stable. Chronic moderate L1 compression deformity, stable. Sclerotic lesions in the T9 and T10 vertebral body, similar to prior study. IMPRESSION: Stable left ovarian mass in this patient with known ovarian cancer. Essentially stable sclerotic foci in the thoracic and lumbar spine. Stable moderate compression fracture at L1. Small amount of ascites in the abdomen and pelvis, new since prior study. Trace left pleural effusion. Dependent atelectasis in the lower lobes. Aortic atherosclerosis.  Coronary artery disease. Electronically Signed   By: Charlett Nose M.D.  On: 12/19/2022 20:15   US Pelvis Complete  Result Date: 12/19/2022 CLINICAL DATA:  Right lower quadrant pain history of ovarian cancer EXAM: TRANSABDOMINAL ULTRASOUND OF PELVIS DOPPLER ULTRASOUND OF OVARIES TECHNIQUE: Transabdominal ultrasound examination of the pelvis was performed including evaluation of the uterus, ovaries, adnexal regions, and pelvic cul-de-sac. Color and duplex Doppler ultrasound was utilized to evaluate blood flow to the ovaries. COMPARISON:  CT 10/15/2022 FINDINGS: Uterus Hysterectomy Endometrium Hysterectomy Right ovary Measurements: 2.5 x 2.2 x 2.7 cm = volume: 8.9 mL. Cystic and soft tissue appearance of the right ovary presumably  corresponding to the right adnexal mass seen on multiple prior CT imaging. This measures 3.8 x 3.9 x 4 cm. Left ovary Oophorectomy Pulsed Doppler evaluation demonstrates normal low-resistance arterial and venous waveforms in the right ovary and associated mass Other: Small volume free fluid in the pelvis. IMPRESSION: 1. Status post hysterectomy and left oophorectomy. 2. 4 cm cystic and soft tissue appearance of the right ovary presumably corresponding to the given history and right adnexal mass seen on multiple prior CT imaging. No convincing evidence for torsion on this exam. 3. Small volume free fluid in the pelvis. Electronically Signed   By: Jasmine Pang M.D.   On: 12/19/2022 19:17   [place] Art/Ven Flow Abd Pelv Doppler  Result Date: 12/19/2022 CLINICAL DATA:  Right lower quadrant pain history of ovarian cancer EXAM: TRANSABDOMINAL ULTRASOUND OF PELVIS DOPPLER ULTRASOUND OF OVARIES TECHNIQUE: Transabdominal ultrasound examination of the pelvis was performed including evaluation of the uterus, ovaries, adnexal regions, and pelvic cul-de-sac. Color and duplex Doppler ultrasound was utilized to evaluate blood flow to the ovaries. COMPARISON:  CT 10/15/2022 FINDINGS: Uterus Hysterectomy Endometrium Hysterectomy Right ovary Measurements: 2.5 x 2.2 x 2.7 cm = volume: 8.9 mL. Cystic and soft tissue appearance of the right ovary presumably corresponding to the right adnexal mass seen on multiple prior CT imaging. This measures 3.8 x 3.9 x 4 cm. Left ovary Oophorectomy Pulsed Doppler evaluation demonstrates normal low-resistance arterial and venous waveforms in the right ovary and associated mass Other: Small volume free fluid in the pelvis. IMPRESSION: 1. Status post hysterectomy and left oophorectomy. 2. 4 cm cystic and soft tissue appearance of the right ovary presumably corresponding to the given history and right adnexal mass seen on multiple prior CT imaging. No convincing evidence for torsion on this exam. 3.  Small volume free fluid in the pelvis. Electronically Signed   By: Jasmine Pang M.D.   On: 12/19/2022 19:17    Microbiology: No results found for this or any previous visit (from the past 240 hour(s)).   Labs: CBC: Recent Labs  Lab 12/21/22 0500 12/23/22 0345  WBC 3.8* 4.5  NEUTROABS  --  2.3  HGB 9.6* 9.4*  HCT 31.3* 30.7*  MCV 102.6* 101.0*  PLT 234 254   Basic Metabolic Panel: Recent Labs  Lab 12/21/22 0500  NA 139  K 3.9  CL 101  CO2 28  GLUCOSE 115*  BUN 13  CREATININE 0.51  CALCIUM 9.4  MG 2.0  PHOS 4.1   Liver Function Tests: Recent Labs  Lab 12/21/22 0500  ALBUMIN 2.8*   No results for input(s): "LIPASE", "AMYLASE" in the last 168 hours. No results for input(s): "AMMONIA" in the last 168 hours. Cardiac Enzymes: No results for input(s): "CKTOTAL", "CKMB", "CKMBINDEX", "TROPONINI" in the last 168 hours. BNP (last 3 results) No results for input(s): "BNP" in the last 8760 hours. CBG: No results for input(s): "GLUCAP" in the last 168 hours.  Time spent: 35 minutes  Signed:  Gillis Santa  [facility] 12/27/2022 12:01 PM

## 2022-12-27 NOTE — Plan of Care (Addendum)
Patient discharge information went over with her, no questions, chest port de-accessed, son called and will be here to pick her up in 10 mins. No further needs or questions.  Problem: Education: Goal: Knowledge of General Education information will improve Description: Including pain rating scale, medication(s)/side effects and non-pharmacologic comfort measures Outcome: Completed/Met   Problem: Health Behavior/Discharge Planning: Goal: Ability to manage health-related needs will improve Outcome: Completed/Met   Problem: Clinical Measurements: Goal: Ability to maintain clinical measurements within normal limits will improve Outcome: Completed/Met Goal: Will remain free from infection Outcome: Completed/Met Goal: Diagnostic test results will improve Outcome: Completed/Met Goal: Respiratory complications will improve Outcome: Completed/Met Goal: Cardiovascular complication will be avoided Outcome: Completed/Met   Problem: Activity: Goal: Risk for activity intolerance will decrease Outcome: Completed/Met   Problem: Nutrition: Goal: Adequate nutrition will be maintained Outcome: Completed/Met   Problem: Coping: Goal: Level of anxiety will decrease Outcome: Completed/Met   Problem: Elimination: Goal: Will not experience complications related to bowel motility Outcome: Completed/Met Goal: Will not experience complications related to urinary retention Outcome: Completed/Met   Problem: Pain Management: Goal: General experience of comfort will improve Outcome: Completed/Met   Problem: Safety: Goal: Ability to remain free from injury will improve Outcome: Completed/Met   Problem: Skin Integrity: Goal: Risk for impaired skin integrity will decrease Outcome: Completed/Met

## 2022-12-27 NOTE — Plan of Care (Signed)

## 2022-12-29 ENCOUNTER — Telehealth: Payer: Self-pay

## 2022-12-29 NOTE — Telephone Encounter (Signed)
Returned her call. She was discharged from the hospital on 11/2 and is now at home. She is waiting on home health and PT to come out..  She is ready to resume chemo. Told her that Dr. Bertis Ruddy is out of the office until 11/6 and that I would forward her message to Dr. Bertis Ruddy. She verbalized understanding.

## 2022-12-29 NOTE — Telephone Encounter (Signed)
Called and left a message asking her to call the office back. She left a message asking for a call.

## 2022-12-30 ENCOUNTER — Telehealth: Payer: Self-pay | Admitting: Nurse Practitioner

## 2022-12-30 ENCOUNTER — Telehealth: Payer: Self-pay

## 2022-12-30 ENCOUNTER — Telehealth: Payer: Self-pay | Admitting: Registered Nurse

## 2022-12-30 ENCOUNTER — Encounter: Payer: Self-pay | Admitting: Hematology and Oncology

## 2022-12-30 NOTE — Telephone Encounter (Signed)
A Call was Placed to Alexandria Mclaughlin,  Her discharge summary was reviewed.  Her Fentanyl 25 MCG and Oxycodone was discontinued. Alexandria Mclaughlin understanding.  She was prescribed Fentanyl 75 MCG and Dilaudid, she verbalizes understanding.  She has a scheduled appointment with Dr Riley Kill, she verbalizes understanding.  Her son is assisting her, she reports.

## 2022-12-30 NOTE — Telephone Encounter (Signed)
Alexandria Mcl[facility] with [facility] [phone] called and left a message. Alexandria Mclaughlin has requested a delay in the start of her care. She has ask them to come out on 11/8 to start care.  FYI

## 2022-12-30 NOTE — Patient Outreach (Signed)
Care Management  Transitions of Care Program Transitions of Care Post-discharge week 2   12/30/2022 Name: Alexandria Mclaughlin MRN: [id number] DOB: Mar 16, 1940  Subjective: Alexandria Mclaughlin is a 82 y.o. year old female who is a primary care patient of Artis Delay, MD. The Care Management team Engaged with patient Engaged with patient by telephone to assess and address transitions of care needs.   Consent to Services:  Patient was given information about care management services, agreed to services, and gave verbal consent to participate.   Assessment:     Goals Addressed             This Visit's Progress    TRANSITION OF CARE       Current Barriers:  Knowledge Deficits related to plan of care for management of acute on chronic pain management due to advanced stage ovarian cancer   RNCM Clinical Goal(s):  Patient will work with the Care Management team over the next 30 days to address Transition of Care Barriers: Medication Management Provider appointments Home Health services Functional/Safety Take all medications exactly as prescribed and will call provider for medication related questions as evidenced by verbalization of adequate/baseline pain control (patient pain rating goal-5 or less), notification to her treatment team of inadequate pain control and/or adverse medication effects, ongoing and timely communication through collaboration with RN Care manager, provider, and care team.   Interventions: Evaluation of current treatment plan related to  self management and patient's adherence to plan as established by provider   Pain Interventions:  (Status:  New goal.)  Pain assessment performed Medications reviewed Discussed importance of adherence to all scheduled medical appointments Counseled on the importance of reporting any/all new or changed pain symptoms or management strategies to pain management provider Advised patient to discuss breakthrough and/or changes in pain with  provider  Patient Goals/Self-Care Activities: Participate in Transition of Care Program/Attend TOC scheduled calls Take all medications as prescribed Attend all scheduled provider appointments Call provider office for new concerns or questions   Follow Up Plan:  Telephone follow up appointment with care management team member scheduled for:  01/08/2023 The patient has been provided with contact information for the care management team and has been advised to call with any health related questions or concerns.                 SDOH Interventions    Flowsheet Row Telephone from 12/30/2022 in [place] [facility] ED to Hosp-Admission (Discharged) from 12/19/2022 in [facility]  SDOH Interventions    Food Insecurity Interventions Intervention Not Indicated Intervention Not Indicated, Inpatient TOC  Housing Interventions -- Intervention Not Indicated, Inpatient TOC  Transportation Interventions Intervention Not Indicated Intervention Not Indicated, Inpatient TOC  Utilities Interventions -- Intervention Not Indicated, Inpatient TOC  Stress Interventions Patient Declined --        Goals Addressed             This Visit's Progress    TRANSITION OF CARE       Current Barriers:  Knowledge Deficits related to plan of care for management of acute on chronic pain management due to advanced stage ovarian cancer   RNCM Clinical Goal(s):  Patient will work with the Care Management team over the next 30 days to address Transition of Care Barriers: Medication Management Provider appointments Home Health services Functional/Safety Take all medications exactly as prescribed and will call provider for medication related questions as evidenced by verbalization of adequate/baseline pain  control (patient pain rating goal-5 or less), notification to her treatment team of inadequate pain control and/or adverse medication effects, ongoing and timely communication  through collaboration with Medical illustrator, provider, and care team.   Interventions: Evaluation of current treatment plan related to  self management and patient's adherence to plan as established by provider   Pain Interventions:  (Status:  New goal.)  Pain assessment performed Medications reviewed Discussed importance of adherence to all scheduled medical appointments Counseled on the importance of reporting any/all new or changed pain symptoms or management strategies to pain management provider Advised patient to discuss breakthrough and/or changes in pain with provider  Patient Goals/Self-Care Activities: Participate in Transition of Care Program/Attend TOC scheduled calls Take all medications as prescribed Attend all scheduled provider appointments Call provider office for new concerns or questions   Follow Up Plan:  Telephone follow up appointment with care management team member scheduled for:  01/08/2023 The patient has been provided with contact information for the care management team and has been advised to call with any health related questions or concerns.          Plan: Telephone follow up appointment with care management team member scheduled for: The patient has been provided with contact information for the care management team and has been advised to call with any health related questions or concerns.   Jasiya Markie Daphine Deutscher BSN, RN RN Care Manager   Transitions of Care [facility] - [facility] Direct Dial Number:  [phone]

## 2022-12-31 ENCOUNTER — Other Ambulatory Visit: Payer: Self-pay | Admitting: Hematology and Oncology

## 2022-12-31 ENCOUNTER — Telehealth: Payer: Self-pay | Admitting: Hematology and Oncology

## 2022-12-31 NOTE — Telephone Encounter (Signed)
Spoke with patient confirming upcoming appointments  

## 2023-01-01 ENCOUNTER — Telehealth: Payer: Self-pay | Admitting: Licensed Clinical Social Worker

## 2023-01-01 NOTE — Telephone Encounter (Signed)
TC to patient to follow-up on coping post hospital stay. Pt is interested in restarting counseling sessions and requested to schedule in 1 month to give herself time to re-adjust. Appt scheduled for 01/27/23 at 2:30pm.  Baylon Santelli E Deserae Jennings, LCSW

## 2023-01-05 ENCOUNTER — Telehealth: Payer: Self-pay | Admitting: *Deleted

## 2023-01-05 IMAGING — CR DG SHOULDER 2+V*R*
3 series · 3 of 3 positions shown · non-contrast
Comparison: 01/26/2018

CLINICAL DATA: Right shoulder pain.  Reduced range of motion.

EXAM:
RIGHT SHOULDER - 2+ VIEW

[w shoulder grashey right]
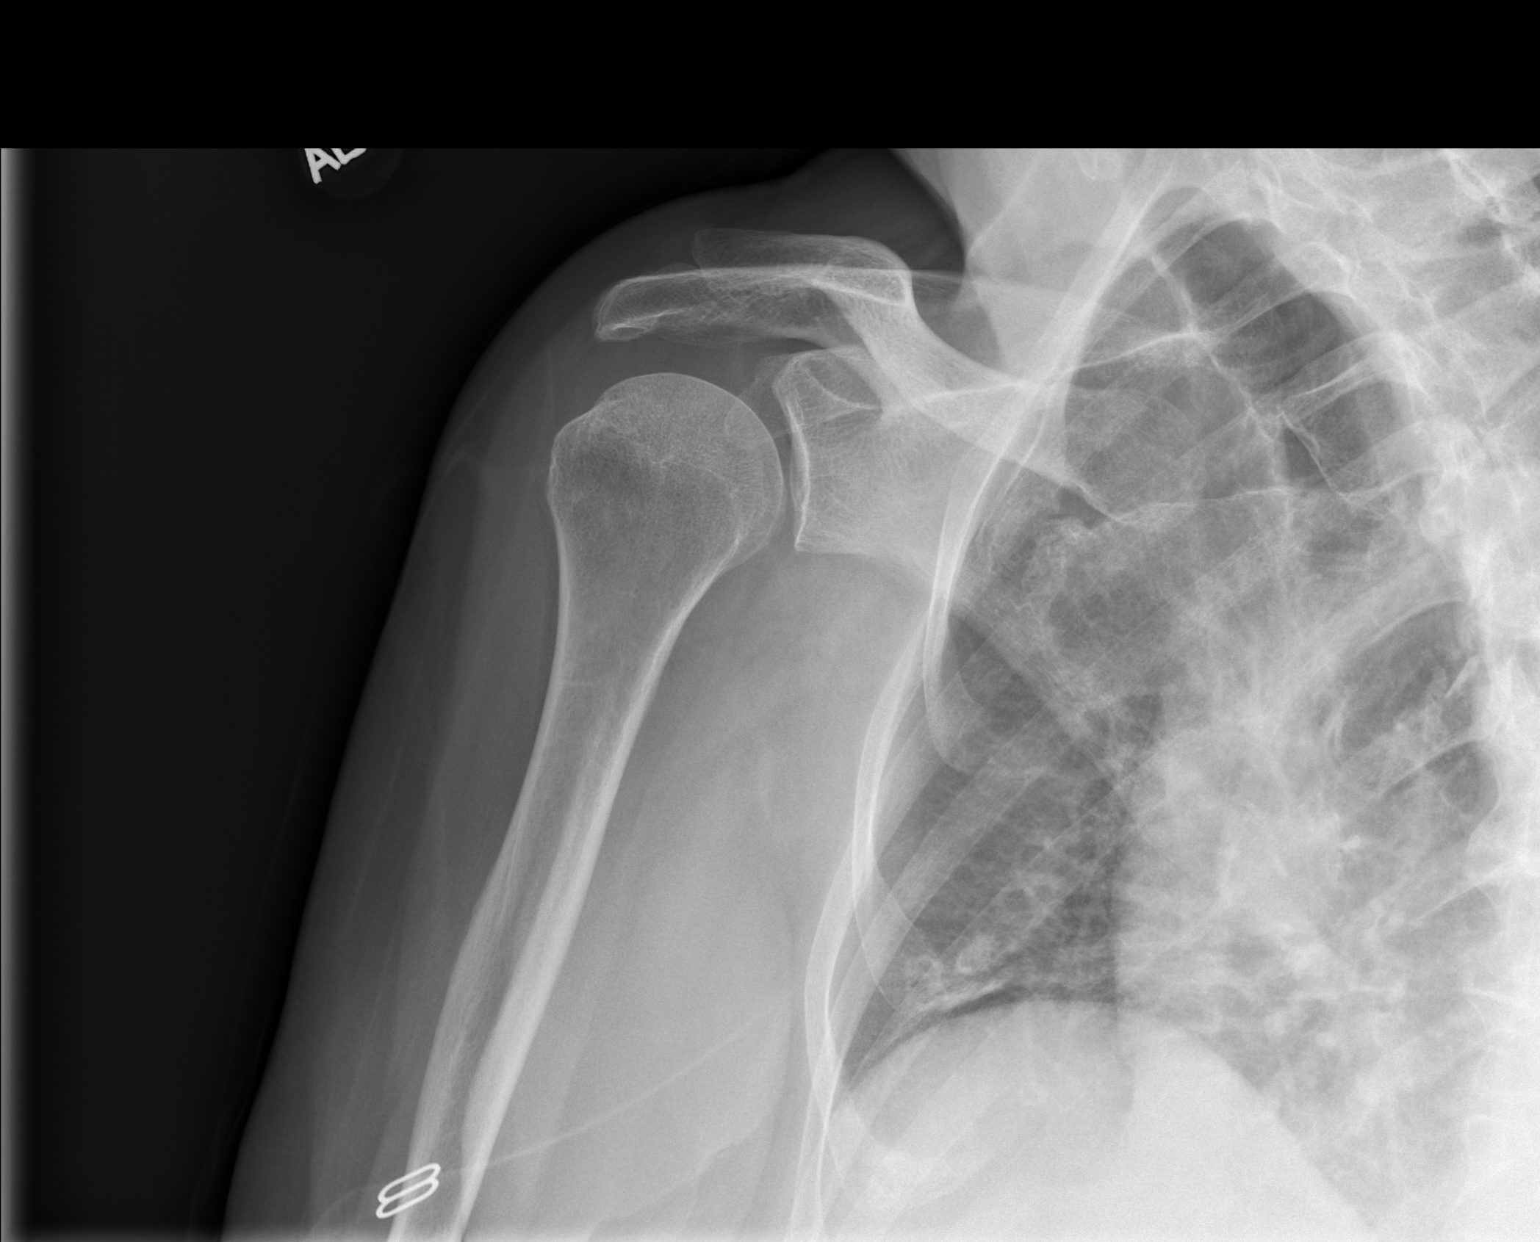

[w shoulder y-view right]
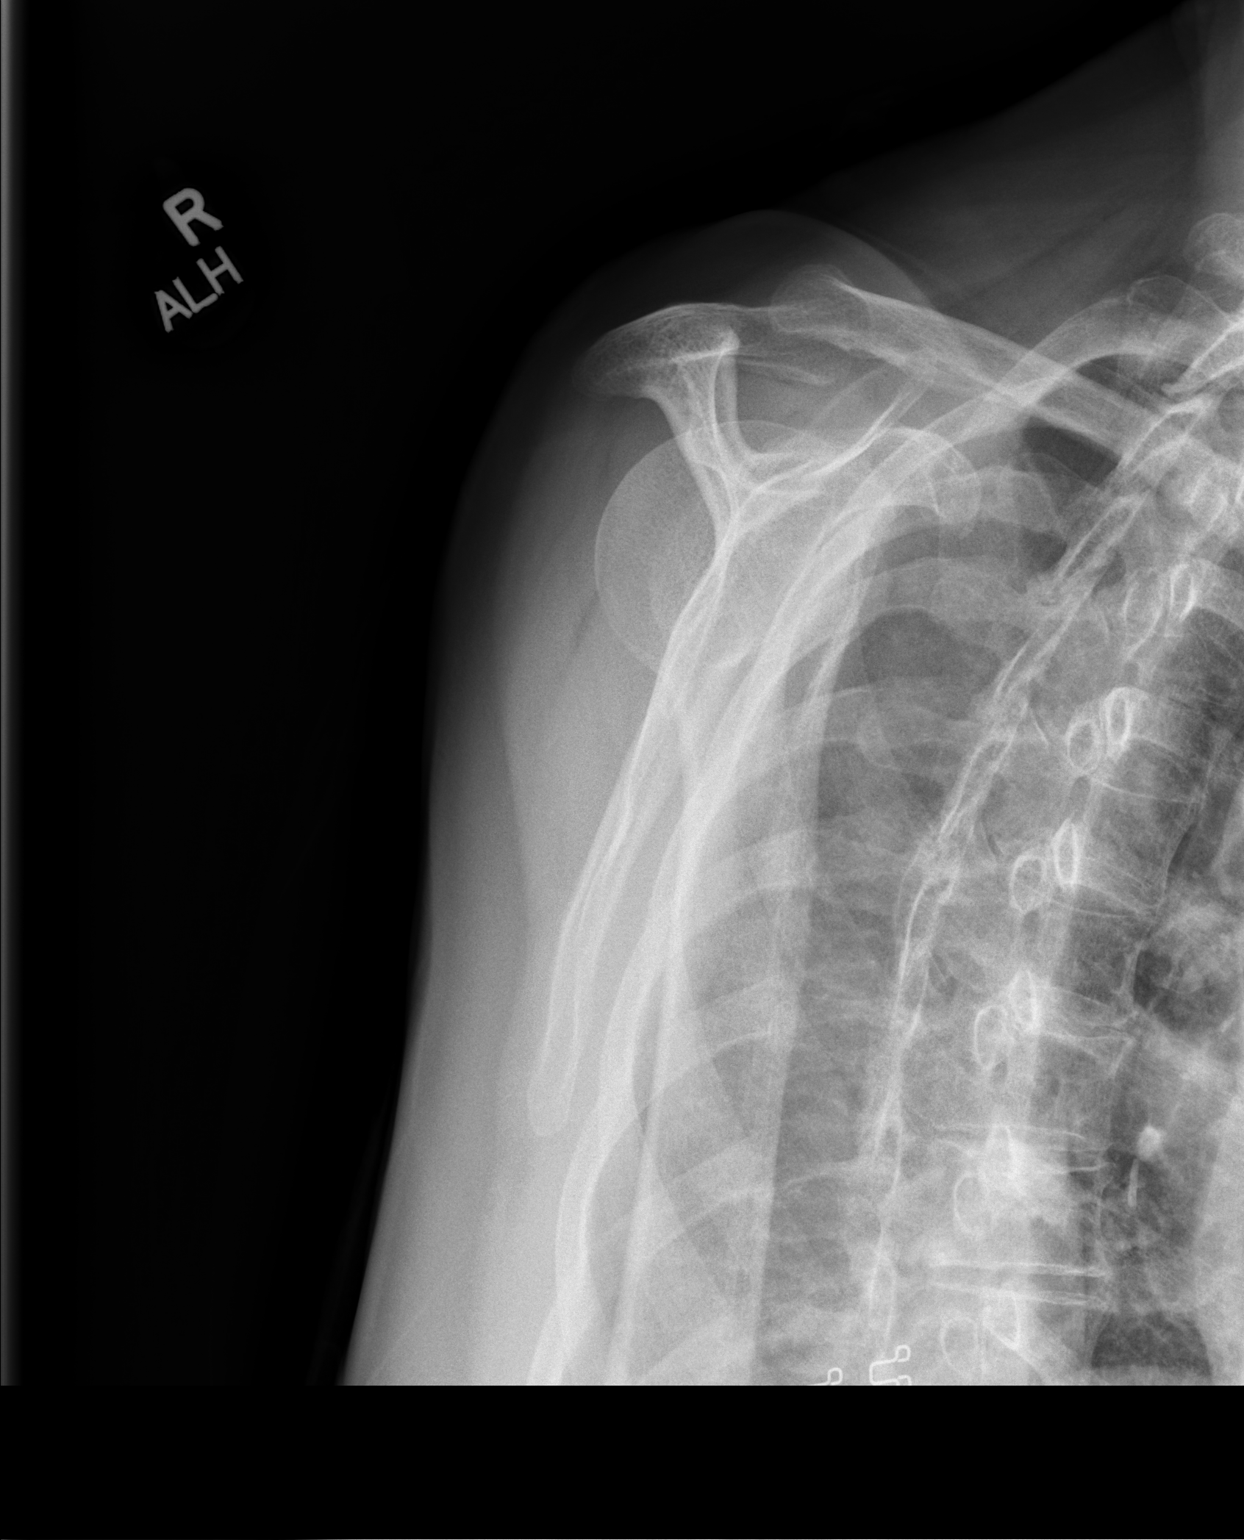

[x shoulder axillary right]
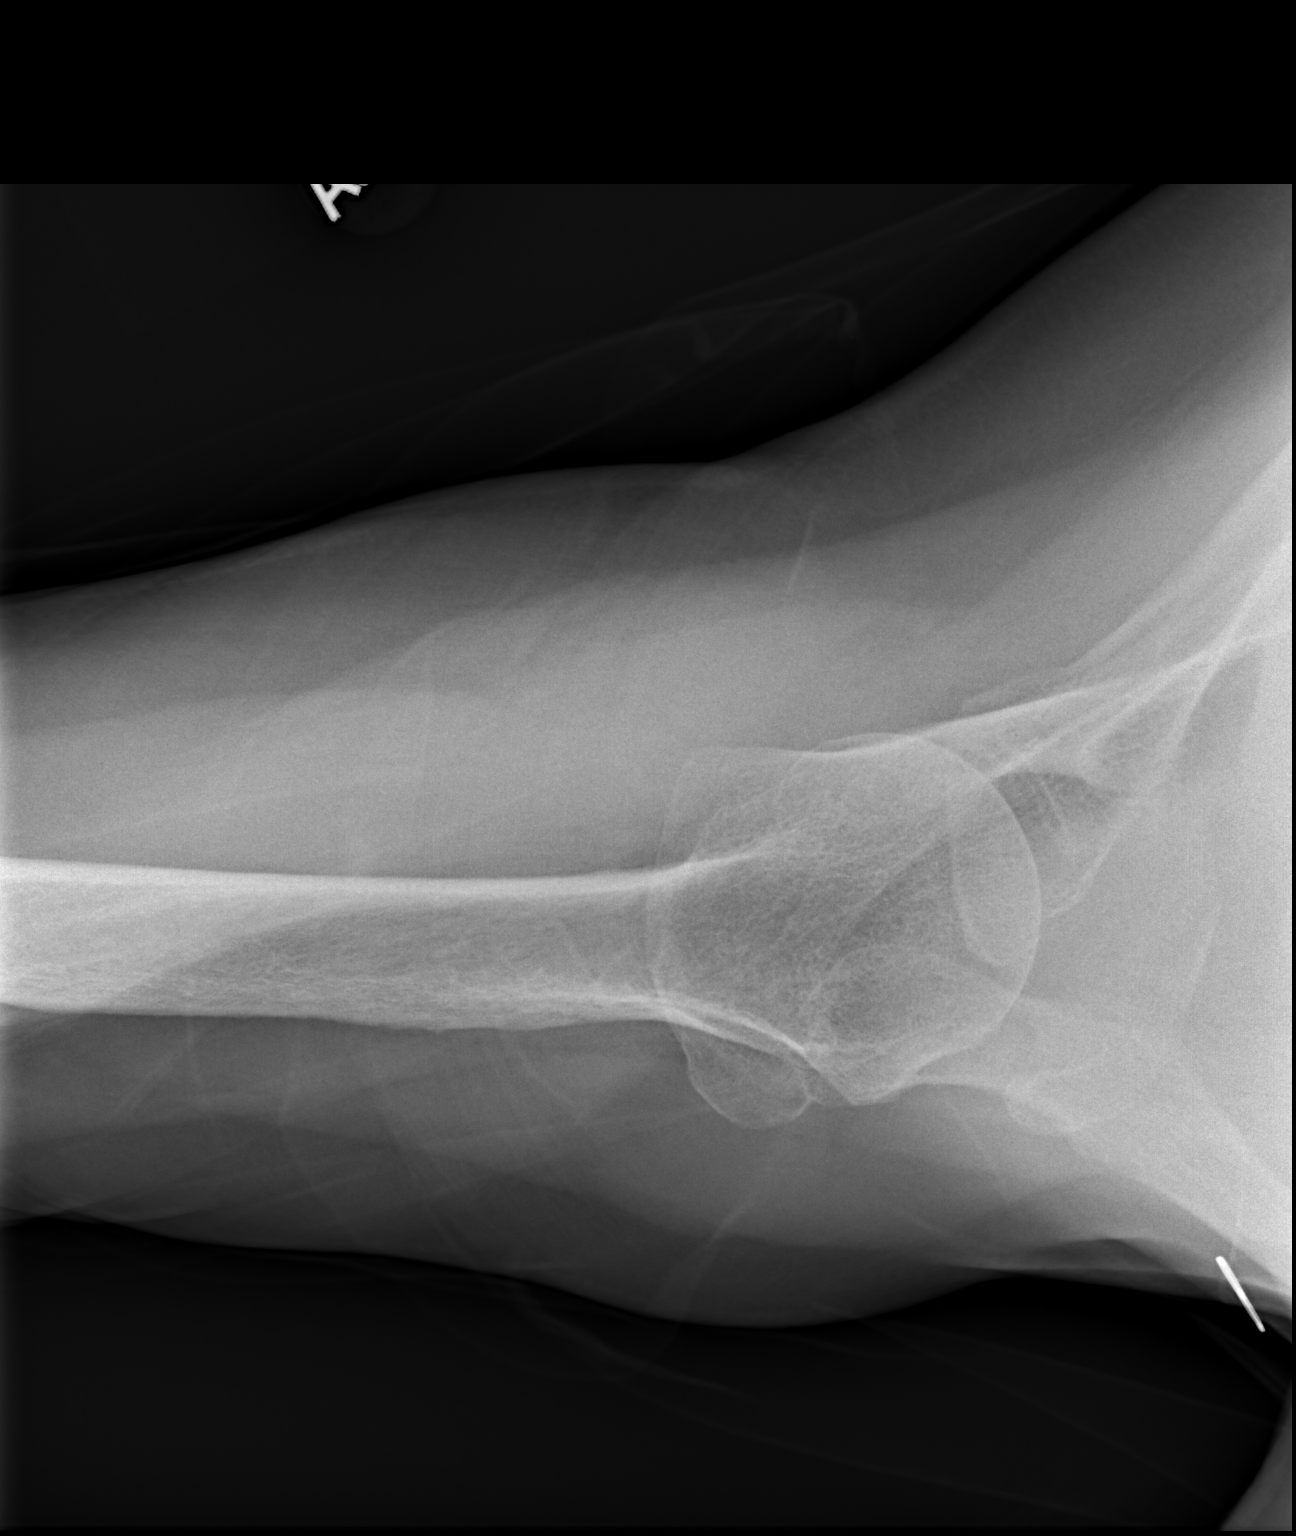

[3 of 3 positions shown; findings below may reference images not displayed]

FINDINGS: Mild inferior spurring of the glenoid similar to the prior exam.
Minimal degenerative AC joint spurring. No malalignment. No acute
bony findings.
IMPRESSION: 1. Minimal to mild degenerative glenohumeral and AC joint
arthropathy.

## 2023-01-05 NOTE — Progress Notes (Signed)
  Care Coordination  Outreach Note  01/05/2023 Name: Alexandria Mclaughlin MRN: [id number] DOB: 09-15-1940   Care Coordination Outreach Attempts: An unsuccessful telephone outreach was attempted today to offer the patient information about available care coordination services.  Follow Up Plan:  Additional outreach attempts will be made to offer the patient care coordination information and services.   Encounter Outcome:  No Answer  Burman Nieves, CCMA Care Coordination Care Guide Direct Dial: [phone]

## 2023-01-06 ENCOUNTER — Inpatient Hospital Stay: Payer: Medicare HMO

## 2023-01-06 ENCOUNTER — Emergency Department (HOSPITAL_COMMUNITY): Payer: Medicare HMO

## 2023-01-06 ENCOUNTER — Encounter (HOSPITAL_COMMUNITY): Payer: Self-pay

## 2023-01-06 ENCOUNTER — Other Ambulatory Visit: Payer: Self-pay

## 2023-01-06 ENCOUNTER — Emergency Department (HOSPITAL_COMMUNITY)
Admission: EM | Admit: 2023-01-06 | Discharge: 2023-01-06 | Disposition: A | Discharge status: Home / Self Care | Payer: Medicare HMO | Attending: Emergency Medicine | Admitting: Emergency Medicine

## 2023-01-06 ENCOUNTER — Inpatient Hospital Stay: Payer: Medicare HMO | Admitting: Hematology and Oncology

## 2023-01-06 DIAGNOSIS — M542 Cervicalgia: Secondary | ICD-10-CM | POA: Diagnosis not present

## 2023-01-06 DIAGNOSIS — I10 Essential (primary) hypertension: Secondary | ICD-10-CM | POA: Diagnosis not present

## 2023-01-06 DIAGNOSIS — R531 Weakness: Secondary | ICD-10-CM | POA: Insufficient documentation

## 2023-01-06 DIAGNOSIS — W19XXXA Unspecified fall, initial encounter: Secondary | ICD-10-CM | POA: Diagnosis not present

## 2023-01-06 DIAGNOSIS — C569 Malignant neoplasm of unspecified ovary: Secondary | ICD-10-CM | POA: Insufficient documentation

## 2023-01-06 DIAGNOSIS — S0990XA Unspecified injury of head, initial encounter: Secondary | ICD-10-CM | POA: Diagnosis not present

## 2023-01-06 DIAGNOSIS — G8929 Other chronic pain: Secondary | ICD-10-CM | POA: Diagnosis not present

## 2023-01-06 DIAGNOSIS — N3000 Acute cystitis without hematuria: Secondary | ICD-10-CM | POA: Insufficient documentation

## 2023-01-06 DIAGNOSIS — Z743 Need for continuous supervision: Secondary | ICD-10-CM | POA: Diagnosis not present

## 2023-01-06 DIAGNOSIS — S199XXA Unspecified injury of neck, initial encounter: Secondary | ICD-10-CM | POA: Diagnosis not present

## 2023-01-06 DIAGNOSIS — Z471 Aftercare following joint replacement surgery: Secondary | ICD-10-CM | POA: Diagnosis not present

## 2023-01-06 DIAGNOSIS — Z79899 Other long term (current) drug therapy: Secondary | ICD-10-CM | POA: Insufficient documentation

## 2023-01-06 DIAGNOSIS — R000 Tachycardia, unspecified: Secondary | ICD-10-CM | POA: Diagnosis not present

## 2023-01-06 LAB — CBC WITH DIFFERENTIAL/PLATELET
Abs Immature Granulocytes: 0.03 10*3/uL (ref 0.00–0.07)
Basophils Absolute: 0 10*3/uL (ref 0.0–0.1)
Basophils Relative: 0 %
Eosinophils Absolute: 0 10*3/uL (ref 0.0–0.5)
Eosinophils Relative: 0 %
HCT: 31 % — ABNORMAL LOW (ref 36.0–46.0)
Hemoglobin: 9.4 g/dL — ABNORMAL LOW (ref 12.0–15.0)
Immature Granulocytes: 0 %
Lymphocytes Relative: 6 %
Lymphs Abs: 0.5 10*3/uL — ABNORMAL LOW (ref 0.7–4.0)
MCH: 29.8 pg (ref 26.0–34.0)
MCHC: 30.3 g/dL (ref 30.0–36.0)
MCV: 98.4 fL (ref 80.0–100.0)
Monocytes Absolute: 0.3 10*3/uL (ref 0.1–1.0)
Monocytes Relative: 4 %
Neutro Abs: 7.8 10*3/uL — ABNORMAL HIGH (ref 1.7–7.7)
Neutrophils Relative %: 90 %
Platelets: 262 10*3/uL (ref 150–400)
RBC: 3.15 MIL/uL — ABNORMAL LOW (ref 3.87–5.11)
RDW: 15.1 % (ref 11.5–15.5)
WBC: 8.6 10*3/uL (ref 4.0–10.5)
nRBC: 0 % (ref 0.0–0.2)

## 2023-01-06 LAB — URINALYSIS, W/ REFLEX TO CULTURE (INFECTION SUSPECTED)
Bilirubin Urine: NEGATIVE
Glucose, UA: NEGATIVE mg/dL
Hgb urine dipstick: NEGATIVE
Ketones, ur: 5 mg/dL — AB
Leukocytes,Ua: NEGATIVE
Nitrite: NEGATIVE
Protein, ur: 100 mg/dL — AB
Specific Gravity, Urine: 1.017 (ref 1.005–1.030)
pH: 8 (ref 5.0–8.0)

## 2023-01-06 LAB — COMPREHENSIVE METABOLIC PANEL
ALT: 15 U/L (ref 0–44)
AST: 22 U/L (ref 15–41)
Albumin: 2.6 g/dL — ABNORMAL LOW (ref 3.5–5.0)
Alkaline Phosphatase: 126 U/L (ref 38–126)
Anion gap: 10 (ref 5–15)
BUN: 25 mg/dL — ABNORMAL HIGH (ref 8–23)
CO2: 29 mmol/L (ref 22–32)
Calcium: 9.1 mg/dL (ref 8.9–10.3)
Chloride: 99 mmol/L (ref 98–111)
Creatinine, Ser: 0.45 mg/dL (ref 0.44–1.00)
GFR, Estimated: >60 mL/min (ref >60)
Glucose, Bld: 117 mg/dL — ABNORMAL HIGH (ref 70–99)
Potassium: 3.7 mmol/L (ref 3.5–5.1)
Sodium: 138 mmol/L (ref 135–145)
Total Bilirubin: 0.4 mg/dL (ref <1.2)
Total Protein: 6.4 g/dL — ABNORMAL LOW (ref 6.5–8.1)

## 2023-01-06 MED ORDER — CEPHALEXIN 500 MG PO CAPS: 500.0000 mg | ORAL_CAPSULE | Freq: Four times a day (QID) | ORAL | 0 refills | Status: DC

## 2023-01-06 MED ORDER — CEPHALEXIN 500 MG PO CAPS
500.0000 mg | ORAL_CAPSULE | Freq: Once | ORAL | Status: AC
  Administered 2023-01-06: 500 mg via ORAL
  Filled 2023-01-06: qty 1

## 2023-01-06 MED ORDER — FAMOTIDINE 20 MG PO TABS
20.0000 mg | ORAL_TABLET | Freq: Once | ORAL | Status: DC
  Filled 2023-01-06: qty 1

## 2023-01-06 NOTE — ED Provider Notes (Signed)
[place] EMERGENCY DEPARTMENT AT [facility] Provider Note   CSN: [id number] Arrival date & time: 01/06/23  1027     History  No chief complaint on file.   Alexandria Mclaughlin is a 82 y.o. female.  Patient is an 82 year old female with a past medical history of ovarian cancer on chemotherapy and hypertension presenting to the emergency department after a fall.  Patient states that she had 2 falls while trying to get ready to go to chemotherapy this morning.  She states both times she felt like her legs are giving out on her.  She said that she felt more weak than usual this morning.  She states that she did go to physical therapy yesterday and felt fine.  She denies any fevers, nausea, vomiting or diarrhea.  She is unsure if she hit her head but denies any loss of consciousness.  She denies any numbness or weakness.  She denies any pain or injury from the fall.  She denies feeling lightheaded or having any chest pain or shortness of breath prior to the fall.  She denies any blood thinner use.  The history is provided by the patient.       Home Medications Prior to Admission medications   Medication Sig Start Date End Date Taking? Authorizing Provider  cephALEXin (KEFLEX) 500 MG capsule Take 1 capsule (500 mg total) by mouth 4 (four) times daily. 01/06/23  Yes Theresia Lo, Benetta Spar K, DO  acetaminophen (TYLENOL) 325 MG tablet Take 2 tablets (650 mg total) by mouth every 8 (eight) hours as needed for headache, fever or mild pain (pain score 1-3). 12/27/22 12/27/23  Gillis Santa, MD  ascorbic acid (VITAMIN C) 500 MG tablet Take 1 tablet (500 mg total) by mouth daily. 12/28/22 03/28/23  Gillis Santa, MD  Cholecalciferol (VITAMIN D) 50 MCG (2000 UT) CAPS Take 2,000 Units by mouth daily.    [provider]  clonazePAM (KLONOPIN) 0.5 MG tablet Take 1 tablet (0.5 mg total) by mouth at bedtime as needed for anxiety. 11/21/22   Raulkar, Drema Pry, MD  cyanocobalamin 1000 MCG tablet Take  1,000 mcg by mouth daily.    [provider]  dexamethasone (DECADRON) 4 MG tablet Take 2 tabs at the night before and 2 tab the morning of chemotherapy, every 3 weeks, by mouth x 6 cycles 04/22/22   Artis Delay, MD  DULoxetine (CYMBALTA) 60 MG capsule TAKE 1 CAPSULE BY MOUTH EVERY DAY Patient taking differently: Take 60 mg by mouth daily. 08/12/22   Ranelle Oyster, MD  fentaNYL (DURAGESIC) 75 MCG/HR Place 1 patch onto the skin every 3 (three) days. 12/29/22   Gillis Santa, MD  gabapentin (NEURONTIN) 600 MG tablet TAKE 1 TABLET BY MOUTH FOUR TIMES A DAY 10/07/22   Jones Bales, NP  HYDROmorphone (DILAUDID) 8 MG tablet Take 1 tablet (8 mg total) by mouth every 6 (six) hours as needed for severe pain (pain score 7-10) or moderate pain (pain score 4-6). 12/27/22   Gillis Santa, MD  irbesartan (AVAPRO) 150 MG tablet Take 150 mg by mouth daily.    [provider]  iron polysaccharides (NIFEREX) 150 MG capsule Take 1 capsule (150 mg total) by mouth daily. 12/28/22 03/28/23  Gillis Santa, MD  lactulose (CHRONULAC) 10 GM/15ML solution TAKE 15 MLS (10 G TOTAL) BY MOUTH 3 (THREE) TIMES DAILY. 08/20/22   Artis Delay, MD  lidocaine-prilocaine (EMLA) cream APPLY TO AFFECTED AREA ONCE AS DIRECTED Patient taking differently: Apply 1 Application topically  See admin instructions. Apply to port before Chemo 12/15/22   Artis Delay, MD  magnesium oxide (MAG-OX) 400 (240 Mg) MG tablet TAKE 1 TABLET BY MOUTH EVERY DAY Patient taking differently: Take 400 mg by mouth daily. 12/08/22   Artis Delay, MD  Melatonin 10 MG TABS Take 10 mg by mouth at bedtime as needed (sleep).    [provider]  methimazole (TAPAZOLE) 5 MG tablet Take 5 mg by mouth See admin instructions. 3 times a week only    [provider]  ondansetron (ZOFRAN) 8 MG tablet Take 1 tablet (8 mg total) by mouth every 8 (eight) hours as needed for nausea or vomiting. Start on the third day after carboplatin. 04/22/22    Artis Delay, MD  pantoprazole (PROTONIX) 40 MG tablet Take 1 tablet (40 mg total) by mouth daily for 14 days. 12/28/22 01/11/23  Gillis Santa, MD  polyethylene glycol (MIRALAX / GLYCOLAX) 17 g packet Take 17 g by mouth 2 (two) times daily.    [provider]  pregabalin (LYRICA) 50 MG capsule Take 1 capsule (50 mg total) by mouth 3 (three) times daily. 11/19/22   Ranelle Oyster, MD  prochlorperazine (COMPAZINE) 10 MG tablet TAKE 1 TABLET BY MOUTH EVERY 6 HOURS AS NEEDED FOR NAUSEA OR VOMITING. Patient taking differently: Take 10 mg by mouth every 6 (six) hours as needed for nausea. 11/03/22   Artis Delay, MD  rosuvastatin (CRESTOR) 40 MG tablet Take 1 tablet (40 mg total) by mouth at bedtime. 05/05/22   Parke Poisson, MD  senna (SENOKOT) 8.6 MG tablet Take 2 tablets by mouth 2 (two) times daily.    [provider]  traZODone (DESYREL) 100 MG tablet TAKE 1 TABLET BY MOUTH EVERYDAY AT BEDTIME Patient taking differently: Take 100 mg by mouth at bedtime. 09/15/22   Jones Bales, NP      Allergies    Patient has no known allergies.    Review of Systems   Review of Systems  Physical Exam Updated Vital Signs BP 131/70   Pulse 87   Temp 98.6 F (37 C) (Oral)   Resp 12   Ht 5\' 3"  (1.6 m)   Wt 69.9 kg   SpO2 93%   BMI 27.30 kg/m  Physical Exam Vitals and nursing note reviewed.  Constitutional:      General: She is not in acute distress.    Appearance: Normal appearance.  HENT:     Head: Normocephalic and atraumatic.     Nose: Nose normal.     Mouth/Throat:     Mouth: Mucous membranes are moist.     Pharynx: Oropharynx is clear.  Eyes:     Extraocular Movements: Extraocular movements intact.     Conjunctiva/sclera: Conjunctivae normal.     Pupils: Pupils are equal, round, and reactive to light.  Neck:     Comments: No midline neck tenderness Cardiovascular:     Rate and Rhythm: Normal rate and regular rhythm.     Heart sounds: Normal heart sounds.   Pulmonary:     Effort: Pulmonary effort is normal.     Breath sounds: Normal breath sounds.  Abdominal:     General: Abdomen is flat.     Palpations: Abdomen is soft.     Tenderness: There is no abdominal tenderness.  Musculoskeletal:        General: Normal range of motion.     Cervical back: Normal range of motion and neck supple.  Comments: No midline back tenderness Pelvis stable, nontender No bony tenderness to bilateral upper or lower extremities  Skin:    General: Skin is warm and dry.  Neurological:     General: No focal deficit present.     Mental Status: She is alert and oriented to person, place, and time.     Sensory: No sensory deficit.     Motor: No weakness.  Psychiatric:        Mood and Affect: Mood normal.        Behavior: Behavior normal.     ED Results / Procedures / Treatments   Labs (all labs ordered are listed, but only abnormal results are displayed) Labs Reviewed  COMPREHENSIVE METABOLIC PANEL - Abnormal; Notable for the following components:      Result Value   Glucose, Bld 117 (*)    BUN 25 (*)    Total Protein 6.4 (*)    Albumin 2.6 (*)    All other components within normal limits  CBC WITH DIFFERENTIAL/PLATELET - Abnormal; Notable for the following components:   RBC 3.15 (*)    Hemoglobin 9.4 (*)    HCT 31.0 (*)    Neutro Abs 7.8 (*)    Lymphs Abs 0.5 (*)    All other components within normal limits  URINALYSIS, W/ REFLEX TO CULTURE (INFECTION SUSPECTED) - Abnormal; Notable for the following components:   APPearance CLOUDY (*)    Ketones, ur 5 (*)    Protein, ur 100 (*)    Bacteria, UA MANY (*)    Non Squamous Epithelial 0-5 (*)    All other components within normal limits  URINE CULTURE    EKG EKG Interpretation Date/Time:  Tuesday January 06 2023 15:47:56 EST Ventricular Rate:  81 PR Interval:  142 QRS Duration:  88 QT Interval:  368 QTC Calculation: 427 R Axis:   13  Text Interpretation: Normal sinus rhythm Normal ECG  No significant change since last tracing Confirmed by Elayne Snare ([id number]) on 01/06/2023 4:22:38 PM  Radiology CT Head Wo Contrast  Result Date: 01/06/2023 CLINICAL DATA:  Head and neck trauma, pain EXAM: CT HEAD WITHOUT CONTRAST CT CERVICAL SPINE WITHOUT CONTRAST TECHNIQUE: Multidetector CT imaging of the head and cervical spine was performed following the standard protocol without intravenous contrast. Multiplanar CT image reconstructions of the cervical spine were also generated. RADIATION DOSE REDUCTION: This exam was performed according to the departmental dose-optimization program which includes automated exposure control, adjustment of the mA and/or kV according to patient size and/or use of iterative reconstruction technique. COMPARISON:  12/27/2020 CT head; no prior CT cervical spine, correlation is made with 12/2020 CTA head and neck FINDINGS: CT HEAD FINDINGS Brain: No evidence of acute infarct, hemorrhage, mass, mass effect, or midline shift. No hydrocephalus or extra-axial fluid collection. Periventricular white matter changes, likely the sequela of chronic small vessel ischemic disease. Vascular: No hyperdense vessel. Skull: Negative for fracture or focal lesion. Sinuses/Orbits: Mucous retention cyst in the left ethmoid air cells. Otherwise clear paranasal sinuses. Status post bilateral lens replacements. Other: The mastoid air cells are well aerated. CT CERVICAL SPINE FINDINGS Alignment: No traumatic listhesis. Preservation of the normal cervical lordosis. Skull base and vertebrae: No acute fracture or suspicious osseous lesion. Soft tissues and spinal canal: No prevertebral fluid or swelling. No visible canal hematoma. Disc levels: Degenerative changes in the cervical spine.No high-grade spinal canal stenosis. Upper chest: No focal pulmonary opacity or pleural effusion. IMPRESSION: 1. No acute intracranial process. 2. No acute  fracture or traumatic listhesis in the cervical spine.  Electronically Signed   By: Wiliam Ke M.D.   On: 01/06/2023 12:57   CT Cervical Spine Wo Contrast  Result Date: 01/06/2023 CLINICAL DATA:  Head and neck trauma, pain EXAM: CT HEAD WITHOUT CONTRAST CT CERVICAL SPINE WITHOUT CONTRAST TECHNIQUE: Multidetector CT imaging of the head and cervical spine was performed following the standard protocol without intravenous contrast. Multiplanar CT image reconstructions of the cervical spine were also generated. RADIATION DOSE REDUCTION: This exam was performed according to the departmental dose-optimization program which includes automated exposure control, adjustment of the mA and/or kV according to patient size and/or use of iterative reconstruction technique. COMPARISON:  12/27/2020 CT head; no prior CT cervical spine, correlation is made with 12/2020 CTA head and neck FINDINGS: CT HEAD FINDINGS Brain: No evidence of acute infarct, hemorrhage, mass, mass effect, or midline shift. No hydrocephalus or extra-axial fluid collection. Periventricular white matter changes, likely the sequela of chronic small vessel ischemic disease. Vascular: No hyperdense vessel. Skull: Negative for fracture or focal lesion. Sinuses/Orbits: Mucous retention cyst in the left ethmoid air cells. Otherwise clear paranasal sinuses. Status post bilateral lens replacements. Other: The mastoid air cells are well aerated. CT CERVICAL SPINE FINDINGS Alignment: No traumatic listhesis. Preservation of the normal cervical lordosis. Skull base and vertebrae: No acute fracture or suspicious osseous lesion. Soft tissues and spinal canal: No prevertebral fluid or swelling. No visible canal hematoma. Disc levels: Degenerative changes in the cervical spine.No high-grade spinal canal stenosis. Upper chest: No focal pulmonary opacity or pleural effusion. IMPRESSION: 1. No acute intracranial process. 2. No acute fracture or traumatic listhesis in the cervical spine. Electronically Signed   By: Wiliam Ke  M.D.   On: 01/06/2023 12:57    Procedures Procedures    Medications Ordered in ED Medications  cephALEXin (KEFLEX) capsule 500 mg (has no administration in time range)  famotidine (PEPCID) tablet 20 mg (has no administration in time range)    ED Course/ Medical Decision Making/ A&P Clinical Course as of 01/06/23 1657  Tue Jan 06, 2023  1416 Hgb at baseline, no acute traumatic injury on CTH/C-spine. [VK]  1637 Many bacteria in the urine. Patient will be treated with keflex. Patient concerned she is too weak to walk. Will have ambulatory trial for disposition. [VK]  1657 Patient signed out to Dr. Adela Lank pending ambulatory trial. [VK]    Clinical Course User Index [VK] Rexford Maus, DO                                 Medical Decision Making This patient presents to the ED with chief complaint(s) of falls, weakness with pertinent past medical history of ovarian cancer on chemotherapy, hypertension which further complicates the presenting complaint. The complaint involves an extensive differential diagnosis and also carries with it a high risk of complications and morbidity.    The differential diagnosis includes due to patient's weakness from the fall, concern for arrhythmia, anemia, dehydration, electrolyte abnormality, infection, due to patient's age concern for ICH, mass effect, no other traumatic injury seen on exam  Additional history obtained: Additional history obtained from N/A Records reviewed previous admission documents and outpatient oncology records  ED Course and Reassessment: On patient's arrival she is hemodynamically stable in no acute distress.  Due to patient's age and fall will have head CT and C-spine performed to evaluate for traumatic injury, no other injuries seen  on exam.  Due to patient's increased weakness today, will have EKG and labs performed to evaluate for cause of her weakness.  She has no focal deficits making CVA unlikely.  The patient will be  closely reassessed.  Independent labs interpretation:  The following labs were independently interpreted: UA positive for UTI, otherwise labs within normal range  Independent visualization of imaging: - I independently visualized the following imaging with scope of interpretation limited to determining acute life threatening conditions related to emergency care: CTH/C-spine, which revealed no acute disease     Amount and/or Complexity of Data Reviewed Labs: ordered. Radiology: ordered.  Risk Prescription drug management.          Final Clinical Impression(s) / ED Diagnoses Final diagnoses:  Acute cystitis without hematuria  Weakness  Fall, initial encounter    Rx / DC Orders ED Discharge Orders          Ordered    cephALEXin (KEFLEX) 500 MG capsule  4 times daily        01/06/23 1656              Rexford Maus, DO 01/06/23 1657

## 2023-01-06 NOTE — ED Notes (Signed)
Changed out EKG cord and EKG leads and EKG stickers on pt and still can not read Lead 4 on EKG

## 2023-01-06 NOTE — ED Notes (Signed)
Pt ambulated like normal her son said.

## 2023-01-06 NOTE — Discharge Instructions (Signed)
You were seen in the ER for your weakness and fall. You had no injury but do have  UTI. We have given you antibiotics and complete this as prescribed.  Follow-up with your primary doctor to have your symptoms rechecked.  You should return to the emergency department for fevers, repetitive vomiting despite the antibiotics, new falls and you injure yourself or any other new or concerning symptoms.

## 2023-01-06 NOTE — ED Triage Notes (Signed)
The pt bib EMS. The pt came from home and son called reporting pt fell this morning. The pt has a PMHx of Ovarian Ca. Her legs gave out on her while getting dressed. Did not lose consciousness. She thinks she hit her head but does not really remember. Cao x4 for EMS. P 102, R 14, B/p 128/80, O2Sats 97%RA. No injuries observed by EMS.

## 2023-01-07 ENCOUNTER — Encounter: Payer: Self-pay | Admitting: Physical Medicine & Rehabilitation

## 2023-01-07 ENCOUNTER — Encounter: Payer: Medicare HMO | Attending: Physical Medicine and Rehabilitation | Admitting: Physical Medicine & Rehabilitation

## 2023-01-07 ENCOUNTER — Telehealth: Payer: Self-pay

## 2023-01-07 VITALS — Ht 63.0 in | Wt 154.0 lb

## 2023-01-07 DIAGNOSIS — M5416 Radiculopathy, lumbar region: Secondary | ICD-10-CM | POA: Insufficient documentation

## 2023-01-07 DIAGNOSIS — M47816 Spondylosis without myelopathy or radiculopathy, lumbar region: Secondary | ICD-10-CM | POA: Diagnosis not present

## 2023-01-07 DIAGNOSIS — M961 Postlaminectomy syndrome, not elsewhere classified: Secondary | ICD-10-CM | POA: Insufficient documentation

## 2023-01-07 DIAGNOSIS — G893 Neoplasm related pain (acute) (chronic): Secondary | ICD-10-CM | POA: Diagnosis not present

## 2023-01-07 MED ORDER — GABAPENTIN 600 MG PO TABS: 600.0000 mg | ORAL_TABLET | Freq: Four times a day (QID) | ORAL | 4 refills | Status: DC

## 2023-01-07 MED ORDER — OXYCODONE-ACETAMINOPHEN 10-325 MG PO TABS: 1.0000 | ORAL_TABLET | Freq: Four times a day (QID) | ORAL | 0 refills | Status: DC | PRN

## 2023-01-07 MED ORDER — FENTANYL 50 MCG/HR TD PT72: 1.0000 | MEDICATED_PATCH | TRANSDERMAL | 0 refills | Status: DC

## 2023-01-07 NOTE — Progress Notes (Signed)
Subjective:    Patient ID: Alexandria Mclaughlin, female    DOB: Jan 30, 1941, 82 y.o.   MRN: [id number]  HPI  The patient has consented to a tele-health/telephone encounter with [facility]. The patient is at home and the provider is at the office. Two separate patient identifiers were utilized to confirm the identity of the patient.  We discussed the limitations of evaluation and management by telemedicine and the availability of in person appointments. The patient expressed understanding and agreed to proceed.    Were meeting today over the telephone regarding her ongoing pain.  She was in the hospital in late October and pain increased.  Apparently she was seen by family practice service who increased her pain medications tremendously.  No communication attempts were made to our practice yet we are expected to continue these medications at the increased dose intensity.  Alexandria Mclaughlin feels that the 75 mcg of fentanyl which was prescribed is too much for her.  The Percocet previously was meeting her pain needs as well as Lyrica.  Dr. Tanya Mclaughlin last saw her in early October.  She was in the emergency room again last night after a fall and a apparently she has a UTI.  She was up most of the night and is feeling pretty worn out today.  She is currently on Keflex 500 mg 4 times daily.    Pain Inventory Average Pain 9 Pain Right Now 9 My pain is constant, sharp, burning, stabbing, tingling, and aching  In the last 24 hours, has pain interfered with the following? General activity 8 Relation with others 8 Enjoyment of life 8 What TIME of day is your pain at its worst? morning , daytime, evening, and night Sleep (in general) Fair  Pain is worse with: walking, bending, sitting, inactivity, standing, and some activites Pain improves with: rest and medication Relief from Meds: 5  Family History  Problem Relation Age of Onset   Diabetes Mother    Heart disease Mother     Stroke Mother    Heart disease Father    Heart disease Brother    Colon cancer Neg Hx    Breast cancer Neg Hx    Ovarian cancer Neg Hx    Endometrial cancer Neg Hx    Pancreatic cancer Neg Hx    Prostate cancer Neg Hx    Social History   Socioeconomic History   Marital status: Widowed    Spouse name: Not on file   Number of children: Not on file   Years of education: 14   Highest education level: Associate degree: academic program  Occupational History   Occupation: Retired  Tobacco Use   Smoking status: Former    Current packs/day: 0.00    Average packs/day: 0.3 packs/day for 15.0 years (3.8 ttl pk-yrs)    Types: Cigarettes    Start date: 03/15/1953    Quit date: 03/15/1968    Years since quitting: 54.8   Smokeless tobacco: Never  Vaping Use   Vaping status: Never Used  Substance and Sexual Activity   Alcohol use: Not Currently    Comment: occasional glass of wine   Drug use: Yes    Frequency: 20.0 times per week    Types: Fentanyl, Hydrocodone   Sexual activity: Not Currently  Other Topics Concern   Not on file  Social History Narrative   Right handed   Drinks caffeine   Condo two story with elevator   Social Determinants of  Health   Financial Resource Strain: Not on file  Food Insecurity: No Food Insecurity (12/30/2022)   Hunger Vital Sign    Worried About Running Out of Food in the Last Year: Never true    Ran Out of Food in the Last Year: Never true  Transportation Needs: No Transportation Needs (12/30/2022)   PRAPARE - Administrator, Civil Service (Medical): No    Lack of Transportation (Non-Medical): No  Physical Activity: Not on file  Stress: Stress Concern Present (12/30/2022)   [employer] of Occupational Health - Occupational Stress Questionnaire    Feeling of Stress : To some extent  Social Connections: Unknown (07/04/2021)   [facility]   Social Network    Social Network: Not on file   Past  Surgical History:  Procedure Laterality Date   ABDOMINAL HYSTERECTOMY     Due to fibroids   BACK SURGERY  2011   lumbar Fusion   BLADDER SURGERY  1985   CESAREAN SECTION     x2   EYE SURGERY Bilateral    Cataract surgery with IOL   FOOT SURGERY Right    "just scraped the bone"   IR BLOCK INJ HYPOGASTRIC PLEXUS  12/23/2022   IR IMAGING GUIDED PORT INSERTION  05/26/2022   LAPAROSCOPY N/A 04/08/2022   Procedure: LAPAROSCOPY DIAGNOSTIC WITH BIOPSIES;  Surgeon: Clide Cliff, MD;  Location: [facility];  Service: Gynecology;  Laterality: N/A;   RETINAL DETACHMENT SURGERY Right 10/2011   TRANSFORAMINAL LUMBAR INTERBODY FUSION W/ MIS 1 LEVEL Right 04/26/2018   Procedure: Right Lumbar four-five Minimally invasive Transforaminal lumbar interbody fusion;  Surgeon: Jadene Pierini, MD;  Location: [facility];  Service: Neurosurgery;  Laterality: Right;   Past Surgical History:  Procedure Laterality Date   ABDOMINAL HYSTERECTOMY     Due to fibroids   BACK SURGERY  2011   lumbar Fusion   BLADDER SURGERY  1985   CESAREAN SECTION     x2   EYE SURGERY Bilateral    Cataract surgery with IOL   FOOT SURGERY Right    "just scraped the bone"   IR BLOCK INJ HYPOGASTRIC PLEXUS  12/23/2022   IR IMAGING GUIDED PORT INSERTION  05/26/2022   LAPAROSCOPY N/A 04/08/2022   Procedure: LAPAROSCOPY DIAGNOSTIC WITH BIOPSIES;  Surgeon: Clide Cliff, MD;  Location: [facility];  Service: Gynecology;  Laterality: N/A;   RETINAL DETACHMENT SURGERY Right 10/2011   TRANSFORAMINAL LUMBAR INTERBODY FUSION W/ MIS 1 LEVEL Right 04/26/2018   Procedure: Right Lumbar four-five Minimally invasive Transforaminal lumbar interbody fusion;  Surgeon: Jadene Pierini, MD;  Location: [facility];  Service: Neurosurgery;  Laterality: Right;   Past Medical History:  Diagnosis Date   Abnormality of gait 02/07/2015   Acquired hallux rigidus of left foot 12/17/2020   Acquired unequal leg length on left 08/08/2011   AMS (altered mental  status) 12/28/2020   Biceps tendonitis on left 08/18/2018   BPPV (benign paroxysmal positional vertigo) 02/07/2015   Bunion 12/17/2020   Cancer (HCC) 04/25/2022   Cerebrovascular disease    Cervical facet syndrome    Coronary artery disease 04/27/2022   Disorders of sacrum    Enthesopathy of hip region    Essential hypertension 02/07/2015   Generalized anxiety disorder    GERD (gastroesophageal reflux disease)    Headache    History of kidney stones    HLD (hyperlipidemia) 02/07/2015   Low back pain 02/07/2015   Lumbar facet arthropathy 07/05/2014  Lumbar post-laminectomy syndrome 06/11/2011   Lumbar radicular pain 07/29/2016   Lumbar radiculopathy 04/26/2018   Major depressive disorder    Mild cognitive impairment of uncertain or unknown etiology 01/24/2022   Multiple lacunar infarcts    MRI - bilateral basal ganglia and left thalamus   Myoclonus 09/19/2015   Rotator cuff tendonitis, left 06/08/2018   Sacroiliac joint dysfunction 06/11/2011   Sciatic neuritis    Stroke [facility]    hx TIA   Subcortical infarction    2016 MRI - small chronic subcortical infarct with associated chronic hemosiderin deposition within the right superior frontal gyrus.   Synovitis and tenosynovitis    Therapeutic opioid induced constipation 07/25/2015   Thyroid disease    Transient left leg weakness    Vision abnormalities    There were no vitals taken for this visit.  Opioid Risk Score:   Fall Risk Score:  `1  Depression screen PHQ 2/9     11/19/2022    2:46 PM 10/15/2022    1:24 PM 08/21/2022    3:01 PM 04/24/2022    1:11 PM 02/25/2022    1:02 PM 08/28/2021    2:31 PM 06/19/2021    2:53 PM  Depression screen PHQ 2/9  Decreased Interest 1 1 1 1  0 0 1  Down, Depressed, Hopeless 1 1 1 1  0 0 1  PHQ - 2 Score 2 2 2 2  0 0 2      Review of Systems  Musculoskeletal:        Right hip pain   All other systems reviewed and are negative.      Objective:   Physical Exam N/a        Assessment & Plan:  1.  Chronic low back pain with lumbar postlaminectomy syndrome.  Recent fall with new L1 compression fracture. - -Reduce fentanyl patch to 50  every 72 hours, #10.    -Resume Percocet 10/325 #75.   We will continue the controlled substance monitoring program, this consists of regular clinic visits, examinations, routine drug screening, pill counts as well as use of [place] Controlled Substance Reporting System. [place] today.  .   -There needs to be better communication from the family practice and oncology team regarding her pain management.  Changes should not be made unilaterally without discussion with [place] 2. Neuropathic Pain/right sided lumbar radicular pain, worsened with ovarian ca/mets             -continue gabapentin             - lyrica for uncontrolled neuropathic pain, will transition over to lyrica alone if it proves helpful 3.  Ovarian cancer             -continue ctx per oncology             -Palliative care involved   20 minutes of tele time were spent during this visit. All questions were encouraged and answered.  Follow up with me in 2 months.  She can call for prescriptions in a month.  We can make any adjustments before then over the phone.

## 2023-01-07 NOTE — Addendum Note (Signed)
Addended by: Faith Rogue T on: 01/07/2023 04:26 PM   Modules accepted: Orders

## 2023-01-07 NOTE — Progress Notes (Signed)
  Care Coordination  Outreach Note  01/07/2023 Name: Alexandria Mclaughlin MRN: [id number] DOB: Jun 24, 1940   Care Coordination Outreach Attempts: A second unsuccessful outreach was attempted today to offer the patient with information about available care coordination services.  Follow Up Plan:  Additional outreach attempts will be made to offer the patient care coordination information and services.   Encounter Outcome:  No Answer  Burman Nieves, CCMA Care Coordination Care Guide Direct Dial: [phone]

## 2023-01-07 NOTE — Telephone Encounter (Signed)
Patient is requesting refill on gabapentin, forgot to mention it to you during her visit

## 2023-01-08 ENCOUNTER — Telehealth: Payer: Self-pay

## 2023-01-08 ENCOUNTER — Other Ambulatory Visit: Payer: Self-pay

## 2023-01-08 NOTE — Telephone Encounter (Signed)
Called pt to advise

## 2023-01-08 NOTE — Telephone Encounter (Signed)
Called to follow up with her after going to the ER. She taking antibiotics. Ask her if she still wants to get chemotherapy. She is not sure and will speak with her son/ family tonight. She will call the office back tomorrow with her decision. She is scheduled already with palliative care next week.

## 2023-01-09 ENCOUNTER — Telehealth: Payer: Self-pay

## 2023-01-09 ENCOUNTER — Other Ambulatory Visit: Payer: Self-pay

## 2023-01-09 LAB — URINE CULTURE: Culture: >=100000 — AB

## 2023-01-09 NOTE — Progress Notes (Signed)
Pt already scheduled for f/u with Licensed Clinical SW

## 2023-01-09 NOTE — Telephone Encounter (Signed)
Called to see if she has made a decision about continuing chemo treatment.  She has not made a decision and will call the office Monday with her decision.

## 2023-01-10 ENCOUNTER — Telehealth (HOSPITAL_BASED_OUTPATIENT_CLINIC_OR_DEPARTMENT_OTHER): Payer: Self-pay | Admitting: *Deleted

## 2023-01-10 NOTE — Telephone Encounter (Signed)
Post ED Visit - Positive Culture Follow-up: Successful Patient Follow-Up  Culture assessed and recommendations reviewed by:  []  Enzo Bi, Pharm.D. []  Celedonio Miyamoto, Pharm.D., BCPS AQ-ID []  Garvin Fila, Pharm.D., BCPS []  Georgina Pillion, Pharm.D., BCPS []  [place], [place].D., BCPS, AAHIVP []  Estella Husk, Pharm.D., BCPS, AAHIVP []  Lysle Pearl, PharmD, BCPS []  Phillips Climes, PharmD, BCPS []  Agapito Games, PharmD, BCPS [x]  Basil Dess, PharmD  Positive urine culture  []  Patient discharged without antimicrobial prescription and treatment is now indicated [x]  Organism is resistant to prescribed ED discharge antimicrobial []  Patient with positive blood cultures  Changes discussed with ED provider: Elpidio Anis, PA-C New antibiotic prescription Bactrim DS 1 tab BID x 5 days Called to [facility] [address]   Contacted patient, date 01/10/23, time 1240   Patsey Berthold 01/10/2023, 12:40 PM

## 2023-01-10 NOTE — Progress Notes (Signed)
ED Antimicrobial Stewardship Positive Culture Follow Up   Alexandria Mclaughlin is an 82 y.o. female who presented to [facility] on 01/06/2023 with a chief complaint of No chief complaint on file.   Recent Results (from the past 720 hour(s))  Urine Culture     Status: Abnormal   Collection Time: 01/06/23  3:58 PM   Specimen: Urine, Random  Result Value Ref Range Status   Specimen Description   Final    URINE, RANDOM Performed at [facility], [address], [place], [place] [address]    Special Requests   Final    NONE Reflexed from [id number] Performed at [facility], [address] [place], [place] [address]    Culture >=100,000 COLONIES/mL ENTEROBACTER CLOACAE (A)  Final   Report Status 01/09/2023 FINAL  Final   Organism ID, Bacteria ENTEROBACTER CLOACAE (A)  Final      Susceptibility   Enterobacter cloacae - MIC*    CEFEPIME <=0.12 SENSITIVE Sensitive     CIPROFLOXACIN <=0.25 SENSITIVE Sensitive     GENTAMICIN <=1 SENSITIVE Sensitive     IMIPENEM 0.5 SENSITIVE Sensitive     NITROFURANTOIN 64 INTERMEDIATE Intermediate     TRIMETH/SULFA <=20 SENSITIVE Sensitive     PIP/TAZO <=4 SENSITIVE Sensitive ug/mL    * >=100,000 COLONIES/mL ENTEROBACTER CLOACAE    [x]  Treated with cephalexin, organism resistant to prescribed antimicrobial []  Patient discharged originally without antimicrobial agent and treatment is now indicated  New antibiotic prescription: Bactrim DS 1 tab by mouth every 12 hours for 5 days.    ED Provider: Levander Campion PA-C  Lynann Beaver PharmD, BCPS [facility] [phone] 01/10/2023 12:26 PM

## 2023-01-11 ENCOUNTER — Emergency Department (HOSPITAL_COMMUNITY): Payer: Medicare HMO

## 2023-01-11 ENCOUNTER — Inpatient Hospital Stay (HOSPITAL_COMMUNITY)
Admission: EM | Admit: 2023-01-11 | Discharge: 2023-01-17 | DRG: 388 | Disposition: A | Discharge status: Hospice | Payer: Medicare HMO | Attending: Internal Medicine | Admitting: Internal Medicine

## 2023-01-11 ENCOUNTER — Other Ambulatory Visit: Payer: Self-pay

## 2023-01-11 ENCOUNTER — Encounter (HOSPITAL_COMMUNITY): Payer: Self-pay

## 2023-01-11 DIAGNOSIS — Z79891 Long term (current) use of opiate analgesic: Secondary | ICD-10-CM

## 2023-01-11 DIAGNOSIS — C561 Malignant neoplasm of right ovary: Secondary | ICD-10-CM | POA: Diagnosis not present

## 2023-01-11 DIAGNOSIS — J690 Pneumonitis due to inhalation of food and vomit: Secondary | ICD-10-CM | POA: Diagnosis not present

## 2023-01-11 DIAGNOSIS — Z66 Do not resuscitate: Secondary | ICD-10-CM | POA: Diagnosis not present

## 2023-01-11 DIAGNOSIS — G894 Chronic pain syndrome: Secondary | ICD-10-CM | POA: Diagnosis present

## 2023-01-11 DIAGNOSIS — F1190 Opioid use, unspecified, uncomplicated: Secondary | ICD-10-CM | POA: Insufficient documentation

## 2023-01-11 DIAGNOSIS — D638 Anemia in other chronic diseases classified elsewhere: Secondary | ICD-10-CM | POA: Insufficient documentation

## 2023-01-11 DIAGNOSIS — D509 Iron deficiency anemia, unspecified: Secondary | ICD-10-CM | POA: Diagnosis not present

## 2023-01-11 DIAGNOSIS — R0902 Hypoxemia: Secondary | ICD-10-CM | POA: Diagnosis not present

## 2023-01-11 DIAGNOSIS — N390 Urinary tract infection, site not specified: Secondary | ICD-10-CM | POA: Diagnosis present

## 2023-01-11 DIAGNOSIS — C786 Secondary malignant neoplasm of retroperitoneum and peritoneum: Secondary | ICD-10-CM | POA: Diagnosis not present

## 2023-01-11 DIAGNOSIS — R188 Other ascites: Principal | ICD-10-CM | POA: Diagnosis present

## 2023-01-11 DIAGNOSIS — K5669 Other partial intestinal obstruction: Principal | ICD-10-CM | POA: Diagnosis present

## 2023-01-11 DIAGNOSIS — K56600 Partial intestinal obstruction, unspecified as to cause: Secondary | ICD-10-CM | POA: Diagnosis not present

## 2023-01-11 DIAGNOSIS — M549 Dorsalgia, unspecified: Secondary | ICD-10-CM | POA: Diagnosis not present

## 2023-01-11 DIAGNOSIS — J984 Other disorders of lung: Secondary | ICD-10-CM | POA: Diagnosis not present

## 2023-01-11 DIAGNOSIS — R112 Nausea with vomiting, unspecified: Secondary | ICD-10-CM | POA: Diagnosis not present

## 2023-01-11 DIAGNOSIS — F419 Anxiety disorder, unspecified: Secondary | ICD-10-CM | POA: Diagnosis present

## 2023-01-11 DIAGNOSIS — F32A Depression, unspecified: Secondary | ICD-10-CM | POA: Diagnosis present

## 2023-01-11 DIAGNOSIS — G893 Neoplasm related pain (acute) (chronic): Secondary | ICD-10-CM | POA: Diagnosis present

## 2023-01-11 DIAGNOSIS — E039 Hypothyroidism, unspecified: Secondary | ICD-10-CM | POA: Diagnosis not present

## 2023-01-11 DIAGNOSIS — Z711 Person with feared health complaint in whom no diagnosis is made: Secondary | ICD-10-CM

## 2023-01-11 DIAGNOSIS — Z79899 Other long term (current) drug therapy: Secondary | ICD-10-CM | POA: Diagnosis not present

## 2023-01-11 DIAGNOSIS — R9389 Abnormal findings on diagnostic imaging of other specified body structures: Secondary | ICD-10-CM | POA: Diagnosis not present

## 2023-01-11 DIAGNOSIS — R140 Abdominal distension (gaseous): Secondary | ICD-10-CM | POA: Diagnosis not present

## 2023-01-11 DIAGNOSIS — E860 Dehydration: Secondary | ICD-10-CM | POA: Diagnosis not present

## 2023-01-11 DIAGNOSIS — R000 Tachycardia, unspecified: Secondary | ICD-10-CM | POA: Diagnosis not present

## 2023-01-11 DIAGNOSIS — E876 Hypokalemia: Secondary | ICD-10-CM | POA: Diagnosis present

## 2023-01-11 DIAGNOSIS — R918 Other nonspecific abnormal finding of lung field: Secondary | ICD-10-CM | POA: Diagnosis not present

## 2023-01-11 DIAGNOSIS — Z87891 Personal history of nicotine dependence: Secondary | ICD-10-CM

## 2023-01-11 DIAGNOSIS — I739 Peripheral vascular disease, unspecified: Secondary | ICD-10-CM | POA: Diagnosis present

## 2023-01-11 DIAGNOSIS — I2510 Atherosclerotic heart disease of native coronary artery without angina pectoris: Secondary | ICD-10-CM | POA: Diagnosis present

## 2023-01-11 DIAGNOSIS — R933 Abnormal findings on diagnostic imaging of other parts of digestive tract: Secondary | ICD-10-CM | POA: Diagnosis not present

## 2023-01-11 DIAGNOSIS — Z9181 History of falling: Secondary | ICD-10-CM

## 2023-01-11 DIAGNOSIS — E785 Hyperlipidemia, unspecified: Secondary | ICD-10-CM | POA: Diagnosis present

## 2023-01-11 DIAGNOSIS — C569 Malignant neoplasm of unspecified ovary: Secondary | ICD-10-CM | POA: Diagnosis present

## 2023-01-11 DIAGNOSIS — M5416 Radiculopathy, lumbar region: Secondary | ICD-10-CM

## 2023-01-11 DIAGNOSIS — G4700 Insomnia, unspecified: Secondary | ICD-10-CM | POA: Diagnosis not present

## 2023-01-11 DIAGNOSIS — Z981 Arthrodesis status: Secondary | ICD-10-CM

## 2023-01-11 DIAGNOSIS — Z6827 Body mass index (BMI) 27.0-27.9, adult: Secondary | ICD-10-CM

## 2023-01-11 DIAGNOSIS — K7689 Other specified diseases of liver: Secondary | ICD-10-CM | POA: Diagnosis not present

## 2023-01-11 DIAGNOSIS — Z8249 Family history of ischemic heart disease and other diseases of the circulatory system: Secondary | ICD-10-CM

## 2023-01-11 DIAGNOSIS — Z87442 Personal history of urinary calculi: Secondary | ICD-10-CM

## 2023-01-11 DIAGNOSIS — R1084 Generalized abdominal pain: Secondary | ICD-10-CM | POA: Diagnosis not present

## 2023-01-11 DIAGNOSIS — R0989 Other specified symptoms and signs involving the circulatory and respiratory systems: Secondary | ICD-10-CM | POA: Diagnosis not present

## 2023-01-11 DIAGNOSIS — Z8673 Personal history of transient ischemic attack (TIA), and cerebral infarction without residual deficits: Secondary | ICD-10-CM

## 2023-01-11 DIAGNOSIS — Z8711 Personal history of peptic ulcer disease: Secondary | ICD-10-CM

## 2023-01-11 DIAGNOSIS — I10 Essential (primary) hypertension: Secondary | ICD-10-CM | POA: Diagnosis not present

## 2023-01-11 DIAGNOSIS — R110 Nausea: Secondary | ICD-10-CM | POA: Diagnosis not present

## 2023-01-11 DIAGNOSIS — K219 Gastro-esophageal reflux disease without esophagitis: Secondary | ICD-10-CM | POA: Diagnosis present

## 2023-01-11 DIAGNOSIS — M47816 Spondylosis without myelopathy or radiculopathy, lumbar region: Secondary | ICD-10-CM

## 2023-01-11 DIAGNOSIS — Z87440 Personal history of urinary (tract) infections: Secondary | ICD-10-CM

## 2023-01-11 DIAGNOSIS — R1900 Intra-abdominal and pelvic swelling, mass and lump, unspecified site: Secondary | ICD-10-CM | POA: Diagnosis not present

## 2023-01-11 DIAGNOSIS — Z7189 Other specified counseling: Secondary | ICD-10-CM | POA: Diagnosis not present

## 2023-01-11 DIAGNOSIS — Z515 Encounter for palliative care: Secondary | ICD-10-CM

## 2023-01-11 DIAGNOSIS — K5900 Constipation, unspecified: Secondary | ICD-10-CM | POA: Diagnosis not present

## 2023-01-11 DIAGNOSIS — C7951 Secondary malignant neoplasm of bone: Secondary | ICD-10-CM | POA: Diagnosis not present

## 2023-01-11 DIAGNOSIS — Z8543 Personal history of malignant neoplasm of ovary: Secondary | ICD-10-CM | POA: Diagnosis not present

## 2023-01-11 DIAGNOSIS — R630 Anorexia: Secondary | ICD-10-CM | POA: Diagnosis present

## 2023-01-11 DIAGNOSIS — Z90710 Acquired absence of both cervix and uterus: Secondary | ICD-10-CM

## 2023-01-11 DIAGNOSIS — R4589 Other symptoms and signs involving emotional state: Secondary | ICD-10-CM | POA: Diagnosis not present

## 2023-01-11 LAB — COMPREHENSIVE METABOLIC PANEL
ALT: 20 U/L (ref 0–44)
AST: 26 U/L (ref 15–41)
Albumin: 2.5 g/dL — ABNORMAL LOW (ref 3.5–5.0)
Alkaline Phosphatase: 177 U/L — ABNORMAL HIGH (ref 38–126)
Anion gap: 16 — ABNORMAL HIGH (ref 5–15)
BUN: 19 mg/dL (ref 8–23)
CO2: 24 mmol/L (ref 22–32)
Calcium: 9.5 mg/dL (ref 8.9–10.3)
Chloride: 97 mmol/L — ABNORMAL LOW (ref 98–111)
Creatinine, Ser: 0.53 mg/dL (ref 0.44–1.00)
GFR, Estimated: >60 mL/min (ref >60)
Glucose, Bld: 109 mg/dL — ABNORMAL HIGH (ref 70–99)
Potassium: 2.8 mmol/L — ABNORMAL LOW (ref 3.5–5.1)
Sodium: 137 mmol/L (ref 135–145)
Total Bilirubin: 0.8 mg/dL (ref <1.2)
Total Protein: 6.2 g/dL — ABNORMAL LOW (ref 6.5–8.1)

## 2023-01-11 LAB — CBC WITH DIFFERENTIAL/PLATELET
Abs Immature Granulocytes: 0.08 10*3/uL — ABNORMAL HIGH (ref 0.00–0.07)
Basophils Absolute: 0 10*3/uL (ref 0.0–0.1)
Basophils Relative: 0 %
Eosinophils Absolute: 0 10*3/uL (ref 0.0–0.5)
Eosinophils Relative: 0 %
HCT: 28.2 % — ABNORMAL LOW (ref 36.0–46.0)
Hemoglobin: 8.9 g/dL — ABNORMAL LOW (ref 12.0–15.0)
Immature Granulocytes: 1 %
Lymphocytes Relative: 8 %
Lymphs Abs: 1.1 10*3/uL (ref 0.7–4.0)
MCH: 30.2 pg (ref 26.0–34.0)
MCHC: 31.6 g/dL (ref 30.0–36.0)
MCV: 95.6 fL (ref 80.0–100.0)
Monocytes Absolute: 1 10*3/uL (ref 0.1–1.0)
Monocytes Relative: 7 %
Neutro Abs: 11.7 10*3/uL — ABNORMAL HIGH (ref 1.7–7.7)
Neutrophils Relative %: 84 %
Platelets: 247 10*3/uL (ref 150–400)
RBC: 2.95 MIL/uL — ABNORMAL LOW (ref 3.87–5.11)
RDW: 14.9 % (ref 11.5–15.5)
WBC: 13.8 10*3/uL — ABNORMAL HIGH (ref 4.0–10.5)
nRBC: 0 % (ref 0.0–0.2)

## 2023-01-11 LAB — MAGNESIUM: Magnesium: 1.5 mg/dL — ABNORMAL LOW (ref 1.7–2.4)

## 2023-01-11 LAB — URINALYSIS, W/ REFLEX TO CULTURE (INFECTION SUSPECTED)
Bilirubin Urine: NEGATIVE
Glucose, UA: NEGATIVE mg/dL
Hgb urine dipstick: NEGATIVE
Ketones, ur: 80 mg/dL — AB
Leukocytes,Ua: NEGATIVE
Nitrite: NEGATIVE
Protein, ur: NEGATIVE mg/dL
Specific Gravity, Urine: 1.01 (ref 1.005–1.030)
pH: 6 (ref 5.0–8.0)

## 2023-01-11 LAB — I-STAT CG4 LACTIC ACID, ED: Lactic Acid, Venous: 1.4 mmol/L (ref 0.5–1.9)

## 2023-01-11 MED ORDER — ONDANSETRON HCL 4 MG PO TABS: 4.0000 mg | ORAL_TABLET | Freq: Four times a day (QID) | ORAL | Status: DC | PRN

## 2023-01-11 MED ORDER — HYDROMORPHONE HCL 1 MG/ML IJ SOLN
0.5000 mg | Freq: Four times a day (QID) | INTRAMUSCULAR | Status: DC | PRN
  Administered 2023-01-11: 0.5 mg via INTRAVENOUS
  Filled 2023-01-11: qty 1

## 2023-01-11 MED ORDER — POLYSACCHARIDE IRON COMPLEX 150 MG PO CAPS: 150.0000 mg | ORAL_CAPSULE | Freq: Every day | ORAL | Status: DC

## 2023-01-11 MED ORDER — ACETAMINOPHEN 325 MG PO TABS
650.0000 mg | ORAL_TABLET | Freq: Four times a day (QID) | ORAL | Status: DC | PRN
  Administered 2023-01-12 – 2023-01-15 (×3): 650 mg via ORAL
  Filled 2023-01-11 (×3): qty 2

## 2023-01-11 MED ORDER — MAGNESIUM SULFATE 2 GM/50ML IV SOLN
2.0000 g | Freq: Once | INTRAVENOUS | Status: AC
  Administered 2023-01-11: 2 g via INTRAVENOUS
  Filled 2023-01-11: qty 50

## 2023-01-11 MED ORDER — DULOXETINE HCL 60 MG PO CPEP
60.0000 mg | ORAL_CAPSULE | Freq: Every day | ORAL | Status: DC
  Administered 2023-01-12 – 2023-01-15 (×4): 60 mg via ORAL
  Filled 2023-01-11 (×4): qty 1

## 2023-01-11 MED ORDER — OXYCODONE-ACETAMINOPHEN 5-325 MG PO TABS
1.0000 | ORAL_TABLET | Freq: Four times a day (QID) | ORAL | Status: DC | PRN
  Administered 2023-01-12: 1 via ORAL
  Filled 2023-01-11: qty 1

## 2023-01-11 MED ORDER — ROSUVASTATIN CALCIUM 20 MG PO TABS
40.0000 mg | ORAL_TABLET | Freq: Every day | ORAL | Status: DC
  Administered 2023-01-11: 40 mg via ORAL
  Filled 2023-01-11: qty 2

## 2023-01-11 MED ORDER — SODIUM CHLORIDE 0.9% FLUSH: 10.0000 mL | INTRAVENOUS | Status: DC | PRN

## 2023-01-11 MED ORDER — ONDANSETRON HCL 4 MG/2ML IJ SOLN
4.0000 mg | Freq: Four times a day (QID) | INTRAMUSCULAR | Status: DC | PRN
  Administered 2023-01-11 – 2023-01-17 (×7): 4 mg via INTRAVENOUS
  Filled 2023-01-11 (×8): qty 2

## 2023-01-11 MED ORDER — POTASSIUM CHLORIDE 10 MEQ/100ML IV SOLN
10.0000 meq | INTRAVENOUS | Status: AC
  Administered 2023-01-11 – 2023-01-12 (×6): 10 meq via INTRAVENOUS
  Filled 2023-01-11 (×6): qty 100

## 2023-01-11 MED ORDER — IOHEXOL 300 MG/ML  SOLN
100.0000 mL | Freq: Once | INTRAMUSCULAR | Status: AC | PRN
  Administered 2023-01-11: 100 mL via INTRAVENOUS

## 2023-01-11 MED ORDER — ACETAMINOPHEN 650 MG RE SUPP: 650.0000 mg | Freq: Four times a day (QID) | RECTAL | Status: DC | PRN

## 2023-01-11 MED ORDER — MAGNESIUM OXIDE -MG SUPPLEMENT 400 (240 MG) MG PO TABS: 400.0000 mg | ORAL_TABLET | Freq: Every day | ORAL | Status: DC

## 2023-01-11 MED ORDER — METOCLOPRAMIDE HCL 5 MG/ML IJ SOLN
10.0000 mg | Freq: Once | INTRAMUSCULAR | Status: AC
  Administered 2023-01-11: 10 mg via INTRAVENOUS
  Filled 2023-01-11: qty 2

## 2023-01-11 MED ORDER — OXYCODONE HCL 5 MG PO TABS: 5.0000 mg | ORAL_TABLET | Freq: Four times a day (QID) | ORAL | Status: DC | PRN

## 2023-01-11 MED ORDER — MELATONIN 5 MG PO TABS
10.0000 mg | ORAL_TABLET | Freq: Every evening | ORAL | Status: DC | PRN
  Administered 2023-01-11 – 2023-01-15 (×2): 10 mg via ORAL
  Filled 2023-01-11 (×2): qty 2

## 2023-01-11 MED ORDER — FENTANYL 50 MCG/HR TD PT72
1.0000 | MEDICATED_PATCH | TRANSDERMAL | Status: DC
  Administered 2023-01-11 – 2023-01-17 (×3): 1 via TRANSDERMAL
  Filled 2023-01-11 (×3): qty 1

## 2023-01-11 MED ORDER — CHLORHEXIDINE GLUCONATE CLOTH 2 % EX PADS
6.0000 | MEDICATED_PAD | Freq: Every day | CUTANEOUS | Status: DC
  Administered 2023-01-11 – 2023-01-16 (×6): 6 via TOPICAL

## 2023-01-11 MED ORDER — OXYCODONE-ACETAMINOPHEN 10-325 MG PO TABS: 1.0000 | ORAL_TABLET | Freq: Four times a day (QID) | ORAL | Status: DC | PRN

## 2023-01-11 MED ORDER — CALCIUM CARBONATE ANTACID 500 MG PO CHEW
1.0000 | CHEWABLE_TABLET | Freq: Two times a day (BID) | ORAL | Status: DC
  Administered 2023-01-12: 200 mg via ORAL
  Filled 2023-01-11: qty 1

## 2023-01-11 MED ORDER — MORPHINE SULFATE (PF) 4 MG/ML IV SOLN
4.0000 mg | Freq: Once | INTRAVENOUS | Status: AC
  Administered 2023-01-11: 4 mg via INTRAVENOUS
  Filled 2023-01-11: qty 1

## 2023-01-11 MED ORDER — HYDROMORPHONE HCL 1 MG/ML IJ SOLN: 0.5000 mg | Freq: Four times a day (QID) | INTRAMUSCULAR | Status: DC | PRN

## 2023-01-11 MED ORDER — MORPHINE SULFATE (PF) 4 MG/ML IV SOLN: 4.0000 mg | Freq: Once | INTRAVENOUS | Status: DC

## 2023-01-11 MED ORDER — TRAZODONE HCL 100 MG PO TABS
100.0000 mg | ORAL_TABLET | Freq: Every day | ORAL | Status: DC
  Administered 2023-01-11 – 2023-01-15 (×5): 100 mg via ORAL
  Filled 2023-01-11 (×5): qty 1

## 2023-01-11 MED ORDER — HYDROMORPHONE HCL 1 MG/ML IJ SOLN
0.5000 mg | Freq: Four times a day (QID) | INTRAMUSCULAR | Status: DC | PRN
  Administered 2023-01-11 – 2023-01-12 (×3): 0.5 mg via INTRAVENOUS
  Filled 2023-01-11 (×3): qty 0.5

## 2023-01-11 MED ORDER — PANTOPRAZOLE SODIUM 40 MG PO TBEC
40.0000 mg | DELAYED_RELEASE_TABLET | Freq: Every day | ORAL | Status: DC
  Administered 2023-01-12 – 2023-01-15 (×4): 40 mg via ORAL
  Filled 2023-01-11 (×4): qty 1

## 2023-01-11 MED ORDER — PANTOPRAZOLE SODIUM 40 MG IV SOLR
40.0000 mg | Freq: Once | INTRAVENOUS | Status: AC
  Administered 2023-01-11: 40 mg via INTRAVENOUS
  Filled 2023-01-11: qty 10

## 2023-01-11 MED ORDER — SODIUM CHLORIDE 0.9% FLUSH
10.0000 mL | Freq: Two times a day (BID) | INTRAVENOUS | Status: DC
  Administered 2023-01-11 – 2023-01-16 (×4): 10 mL

## 2023-01-11 MED ORDER — CLONAZEPAM 0.5 MG PO TABS
0.5000 mg | ORAL_TABLET | Freq: Every evening | ORAL | Status: DC | PRN
  Administered 2023-01-12 – 2023-01-15 (×2): 0.5 mg via ORAL
  Filled 2023-01-11 (×3): qty 1

## 2023-01-11 MED ORDER — SENNOSIDES-DOCUSATE SODIUM 8.6-50 MG PO TABS: 1.0000 | ORAL_TABLET | Freq: Every evening | ORAL | Status: DC | PRN

## 2023-01-11 MED ORDER — GABAPENTIN 300 MG PO CAPS
600.0000 mg | ORAL_CAPSULE | Freq: Four times a day (QID) | ORAL | Status: DC
  Administered 2023-01-12 – 2023-01-13 (×5): 600 mg via ORAL
  Filled 2023-01-11: qty 6
  Filled 2023-01-11: qty 2
  Filled 2023-01-11 (×2): qty 6
  Filled 2023-01-11: qty 2
  Filled 2023-01-11: qty 6

## 2023-01-11 MED ORDER — IRBESARTAN 150 MG PO TABS
150.0000 mg | ORAL_TABLET | Freq: Every day | ORAL | Status: DC
  Administered 2023-01-11: 150 mg via ORAL
  Filled 2023-01-11: qty 1

## 2023-01-11 NOTE — ED Triage Notes (Signed)
Pt BIB [facility] from home d/t Nausea from Ovarian Cancer .  Onset today - pt received Zofran 4 mg IN IM.

## 2023-01-11 NOTE — ED Provider Notes (Signed)
[place] [facility]PARTMENT AT [facility] Provider Note   CSN: [id number] Arrival date & time: 01/11/23  1346     History  No chief complaint on file.   Alexandria Mclaughlin is a 82 y.o. female with PMH as listed below who presents BIB [facility] from home d/t Nausea/vomiting for 3 days, unable to eat anything. Was recently diagnosed with UTI, also had antibiotics changed to Bactrim and has only taken 1 dose but had different antibiotic before that, unclear what that is.  Denies any flank pain, fever/chills, dysuria.  Endorses diffuse mild abdominal pain.  She was hospitalized from 10/19/2022 to 12/27/2022 for intractable abdominal pain related to her advancing metastatic ovarian cancer with peritoneal carcinomatosis.  She is status post a superior hypogastric plexus block with IR with improving pain since then.  Her hospitalization was also complicated by encephalopathy thought to be due to likely to polypharmacy.  For this reason she has not received chemotherapy in nearly a month.  Past Medical History:  Diagnosis Date   Abnormality of gait 02/07/2015   Acquired hallux rigidus of left foot 12/17/2020   Acquired unequal leg length on left 08/08/2011   AMS (altered mental status) 12/28/2020   Biceps tendonitis on left 08/18/2018   BPPV (benign paroxysmal positional vertigo) 02/07/2015   Bunion 12/17/2020   Cancer (HCC) 04/25/2022   Cerebrovascular disease    Cervical facet syndrome    Coronary artery disease 04/27/2022   Disorders of sacrum    Enthesopathy of hip region    Essential hypertension 02/07/2015   Generalized anxiety disorder    GERD (gastroesophageal reflux disease)    Headache    History of kidney stones    HLD (hyperlipidemia) 02/07/2015   Low back pain 02/07/2015   Lumbar facet arthropathy 07/05/2014   Lumbar post-laminectomy syndrome 06/11/2011   Lumbar radicular pain 07/29/2016   Lumbar radiculopathy 04/26/2018   Major depressive disorder    Mild cognitive  impairment of uncertain or unknown etiology 01/24/2022   Multiple lacunar infarcts    MRI - bilateral basal ganglia and left thalamus   Myoclonus 09/19/2015   Rotator cuff tendonitis, left 06/08/2018   Sacroiliac joint dysfunction 06/11/2011   Sciatic neuritis    Stroke (HCC)    hx TIA   Subcortical infarction    2016 MRI - small chronic subcortical infarct with associated chronic hemosiderin deposition within the right superior frontal gyrus.   Synovitis and tenosynovitis    Therapeutic opioid induced constipation 07/25/2015   Thyroid disease    Transient left leg weakness    Vision abnormalities        Home Medications Prior to Admission medications   Medication Sig Start Date End Date Taking? Authorizing Provider  acetaminophen (TYLENOL) 325 MG tablet Take 2 tablets (650 mg total) by mouth every 8 (eight) hours as needed for headache, fever or mild pain (pain score 1-3). 12/27/22 12/27/23  Gillis Santa, MD  ascorbic acid (VITAMIN C) 500 MG tablet Take 1 tablet (500 mg total) by mouth daily. 12/28/22 03/28/23  Gillis Santa, MD  cephALEXin (KEFLEX) 500 MG capsule Take 1 capsule (500 mg total) by mouth 4 (four) times daily. 01/06/23   Rexford Maus, DO  Cholecalciferol (VITAMIN D) 50 MCG (2000 UT) CAPS Take 2,000 Units by mouth daily.    [provider]  clonazePAM (KLONOPIN) 0.5 MG tablet Take 1 tablet (0.5 mg total) by mouth at bedtime as needed for anxiety. 11/21/22   Raulkar, Drema Pry, MD  COMIRNATY syringe  11/06/22   [provider]  cyanocobalamin 1000 MCG tablet Take 1,000 mcg by mouth daily.    [provider]  dexamethasone (DECADRON) 4 MG tablet Take 2 tabs at the night before and 2 tab the morning of chemotherapy, every 3 weeks, by mouth x 6 cycles 04/22/22   Artis Delay, MD  DULoxetine (CYMBALTA) 60 MG capsule TAKE 1 CAPSULE BY MOUTH EVERY DAY Patient taking differently: Take 60 mg by mouth daily. 08/12/22   Ranelle Oyster, MD  fentaNYL  (DURAGESIC) 50 MCG/HR Place 1 patch onto the skin every 3 (three) days. 01/07/23   Ranelle Oyster, MD  FLUZONE HIGH-DOSE 0.5 ML injection  11/06/22   [provider]  gabapentin (NEURONTIN) 600 MG tablet Take 1 tablet (600 mg total) by mouth 4 (four) times daily. 01/07/23   Ranelle Oyster, MD  irbesartan (AVAPRO) 150 MG tablet Take 150 mg by mouth daily.    [provider]  iron polysaccharides (NIFEREX) 150 MG capsule Take 1 capsule (150 mg total) by mouth daily. 12/28/22 03/28/23  Gillis Santa, MD  lactulose (CHRONULAC) 10 GM/15ML solution TAKE 15 MLS (10 G TOTAL) BY MOUTH 3 (THREE) TIMES DAILY. 08/20/22   Artis Delay, MD  lidocaine-prilocaine (EMLA) cream APPLY TO AFFECTED AREA ONCE AS DIRECTED Patient taking differently: Apply 1 Application topically See admin instructions. Apply to port before Chemo 12/15/22   Artis Delay, MD  magnesium oxide (MAG-OX) 400 (240 Mg) MG tablet TAKE 1 TABLET BY MOUTH EVERY DAY Patient taking differently: Take 400 mg by mouth daily. 12/08/22   Artis Delay, MD  Melatonin 10 MG TABS Take 10 mg by mouth at bedtime as needed (sleep).    [provider]  methimazole (TAPAZOLE) 5 MG tablet Take 5 mg by mouth See admin instructions. 3 times a week only    [provider]  ondansetron (ZOFRAN) 8 MG tablet Take 1 tablet (8 mg total) by mouth every 8 (eight) hours as needed for nausea or vomiting. Start on the third day after carboplatin. 04/22/22   Artis Delay, MD  oxyCODONE-acetaminophen (PERCOCET) 10-325 MG tablet Take 1 tablet by mouth every 6 (six) hours as needed for pain. 01/07/23   Ranelle Oyster, MD  pantoprazole (PROTONIX) 40 MG tablet Take 1 tablet (40 mg total) by mouth daily for 14 days. 12/28/22 01/11/23  Gillis Santa, MD  polyethylene glycol (MIRALAX / GLYCOLAX) 17 g packet Take 17 g by mouth 2 (two) times daily.    [provider]  pregabalin (LYRICA) 50 MG capsule Take 1 capsule (50 mg total) by mouth 3 (three)  times daily. 11/19/22   Ranelle Oyster, MD  prochlorperazine (COMPAZINE) 10 MG tablet TAKE 1 TABLET BY MOUTH EVERY 6 HOURS AS NEEDED FOR NAUSEA OR VOMITING. Patient taking differently: Take 10 mg by mouth every 6 (six) hours as needed for nausea. 11/03/22   Artis Delay, MD  rosuvastatin (CRESTOR) 40 MG tablet Take 1 tablet (40 mg total) by mouth at bedtime. 05/05/22   Parke Poisson, MD  senna (SENOKOT) 8.6 MG tablet Take 2 tablets by mouth 2 (two) times daily.    [provider]  traZODone (DESYREL) 100 MG tablet TAKE 1 TABLET BY MOUTH EVERYDAY AT BEDTIME Patient taking differently: Take 100 mg by mouth at bedtime. 09/15/22   Jones Bales, NP      Allergies    Patient has no known allergies.    Review of Systems   Review  of Systems A 10 point review of systems was performed and is negative unless otherwise reported in HPI.  Physical Exam Updated Vital Signs BP (!) 163/98 (BP Location: Left Arm)   Pulse (!) 102   Temp 98.4 F (36.9 C) (Oral)   Resp 15   Ht 5\' 3"  (1.6 m)   Wt 69.8 kg   SpO2 97%   BMI 27.27 kg/m  Physical Exam General: elderly uncomfortable appearing female, lying in bed.  HEENT: Sclera anicteric, dry mucous membranes, trachea midline.  Cardiology: RRR, no murmurs/rubs/gallops. Resp: Normal respiratory rate and effort. CTAB, no wheezes, rhonchi, crackles.  Abd: Soft, diffuse abdominal TTP, non-distended. No rebound tenderness or guarding.  GU: Deferred. MSK: No peripheral edema or signs of trauma. Extremities without deformity or TTP. No cyanosis or clubbing. Skin: warm, dry.  Back: No CVA tenderness Neuro: A&Ox4, CNs II-XII grossly intact. MAEs. Sensation grossly intact.  Psych: Normal mood and affect.   ED Results / Procedures / Treatments   Labs (all labs ordered are listed, but only abnormal results are displayed) Labs Reviewed  CBC WITH DIFFERENTIAL/PLATELET - Abnormal; Notable for the following components:      Result Value   WBC 13.8  (*)    RBC 2.95 (*)    Hemoglobin 8.9 (*)    HCT 28.2 (*)    Neutro Abs 11.7 (*)    Abs Immature Granulocytes 0.08 (*)    All other components within normal limits  COMPREHENSIVE METABOLIC PANEL - Abnormal; Notable for the following components:   Potassium 2.8 (*)    Chloride 97 (*)    Glucose, Bld 109 (*)    Total Protein 6.2 (*)    Albumin 2.5 (*)    Alkaline Phosphatase 177 (*)    Anion gap 16 (*)    All other components within normal limits  MAGNESIUM - Abnormal; Notable for the following components:   Magnesium 1.5 (*)    All other components within normal limits  URINALYSIS, W/ REFLEX TO CULTURE (INFECTION SUSPECTED) - Abnormal; Notable for the following components:   Ketones, ur 80 (*)    Bacteria, UA RARE (*)    All other components within normal limits  I-STAT CG4 LACTIC ACID, ED    EKG EKG Interpretation Date/Time:  Sunday January 11 2023 14:36:03 EST Ventricular Rate:  106 PR Interval:  134 QRS Duration:  65 QT Interval:  308 QTC Calculation: 409 R Axis:   40  Text Interpretation: Sinus tachycardia Abnormal R-wave progression, early transition Borderline repolarization abnormality Confirmed by Vivi Barrack [phone]) on 01/11/2023 4:22:34 PM  Radiology CT ABDOMEN PELVIS W CONTRAST  Result Date: 01/11/2023 CLINICAL DATA:  Nausea and vomiting.  History of ovarian cancer. EXAM: CT ABDOMEN AND PELVIS WITH CONTRAST TECHNIQUE: Multidetector CT imaging of the abdomen and pelvis was performed using the standard protocol following bolus administration of intravenous contrast. RADIATION DOSE REDUCTION: This exam was performed according to the departmental dose-optimization program which includes automated exposure control, adjustment of the mA and/or kV according to patient size and/or use of iterative reconstruction technique. CONTRAST:  OMNIPAQUE IOHEXOL 300 MG/ML  SOLN COMPARISON:  December 19, 2022 FINDINGS: Lower chest: No acute abnormality. Hepatobiliary: New  subcapsular fluid collection in the right liver measures water density, and measures 11.9 x 5.9 x 15 cm. Otherwise normal attenuation of the liver. The gallbladder is normally distended. Pancreas: Unremarkable. No pancreatic ductal dilatation or surrounding inflammatory changes. Spleen: Normal in size without focal abnormality. Adrenals/Urinary Tract: Adrenal glands are  unremarkable. Kidneys are normal, without renal calculi, focal lesion, or hydronephrosis. Bladder is unremarkable. Stomach/Bowel: Sub pathologic gas and fluid distension of small bowels in the mid abdomen, which measure up to 2.8 cm. Preserved colonic pattern. Vascular/Lymphatic: Aortic atherosclerosis. No enlarged abdominal or pelvic lymph nodes. Reproductive: Right adnexal cystic and solid mass measures 3.8 x 4.2 x 2.7 cm., slightly enlarged. Loculated tubular fluid collection is seen extending from the mass to the central pelvis. Post hysterectomy. Other: No abdominal wall hernia. Musculoskeletal: Sclerotic metastatic disease in the thoracolumbar spine is mostly stable. No new compression fractures. Chronic moderate L1 compression deformity. IMPRESSION: 1. New subcapsular fluid collection in the right liver measures water density, and measures 11.9 x 5.9 x 15 cm. 2. Sub pathologic gas and fluid distension of small bowels in the mid abdomen, with cross-section measuring up to 2.8 cm. Findings are concerning for early or partial small bowel obstruction. 3. Right adnexal cystic and solid mass measures 3.8 x 4.2 x 2.7 cm, slightly enlarged. Loculated tubular fluid collection is seen extending from the mass to the central pelvis, also slightly enlarged. 4. Sclerotic metastatic disease in the thoracolumbar spine is mostly stable. 5. Chronic moderate L1 compression deformity. 6. Aortic atherosclerosis. Aortic Atherosclerosis (ICD10-I70.0). Electronically Signed   By: Ted Mcalpine M.D.   On: 01/11/2023 16:03    Procedures Procedures     Medications Ordered in ED Medications  potassium chloride 10 mEq in 100 mL IVPB (has no administration in time range)  melatonin tablet 10 mg (10 mg Oral Given 01/11/23 2145)  metoCLOPramide (REGLAN) injection 10 mg (10 mg Intravenous Given 01/11/23 1444)  pantoprazole (PROTONIX) injection 40 mg (40 mg Intravenous Given 01/11/23 1444)  iohexol (OMNIPAQUE) 300 MG/ML solution 100 mL (100 mLs Intravenous Contrast Given 01/11/23 1510)  morphine (PF) 4 MG/ML injection 4 mg (4 mg Intravenous Given 01/11/23 1628)    ED Course/ Medical Decision Making/ A&P                          Medical Decision Making Amount and/or Complexity of Data Reviewed Labs: ordered. Radiology: ordered. Decision-making details documented in ED Course.  Risk Prescription drug management. Decision regarding hospitalization.    This patient presents to the ED for concern of mild vomiting, diffuse abdominal pain, recent UTI, this involves an extensive number of treatment options, and is a complaint that carries with it a high risk of complications and morbidity.  I considered the following differential and admission for this acute, potentially life threatening condition.   MDM:    DDX for this patient's nausea/vomiting includes but is not limited to:  Consider failure of outpatient antibiotics and continued UTI.  No CVA tenderness to indicate pyelonephritis.  Consider small bowel obstruction, vascular catastrophe, appendicitis, diverticulitis, known pain from diffusely metastatic ovarian cancer with peritoneal carcinomatosis.  Also consider gastritis or GERD.  Consider atypical ACS and EKG is reassuring without any signs of ischemia.  No right upper quadrant tenderness to suggest acute hepatobiliary disease and Eulah Pont sign is negative.  Will obtain labs as well as CT abdomen pelvis.  Labs demonstrate hypokalemia 2.8 which is repleted IV.  She has Elevation in her alk phos but no significant elevation in AST or ALT  and T. bili is within normal limits.  Also with leukocytosis 13.8 and hemoglobin 8.9, baseline approximately 9.4.  UA does not demonstrate any infection.  Lactic acid within normal limits.  Clinical Course as of 01/11/23 2211  Wynelle Link  Jan 11, 2023  1621 CT ABDOMEN PELVIS W CONTRAST 1. New subcapsular fluid collection in the right liver measures water density, and measures 11.9 x 5.9 x 15 cm. 2. Sub pathologic gas and fluid distension of small bowels in the mid abdomen, with cross-section measuring up to 2.8 cm. Findings are concerning for early or partial small bowel obstruction. 3. Right adnexal cystic and solid mass measures 3.8 x 4.2 x 2.7 cm, slightly enlarged. Loculated tubular fluid collection is seen extending from the mass to the central pelvis, also slightly enlarged. 4. Sclerotic metastatic disease in the thoracolumbar spine is mostly stable. 5. Chronic moderate L1 compression deformity. 6. Aortic atherosclerosis.   [HN]  1656 Pt did have fall on 11/12 reported in ED. Possible subcapsular liver hematoma. D/w Dr. Michaell Cowing who states that Hgb is low but stable over the last week, and there wouldn't be anything to do about it currently. Not on active blood thinners. Will consult with hospitalist and OB/gyn. [HN]  1726 D/w Dr. Alvester Morin with gyn onc who will see them in the AM and follow inpatient.  [HN]    Clinical Course User Index [HN] Loetta Rough, MD    Labs: I Ordered, and personally interpreted labs.  The pertinent results include:  those listed above Imaging Studies ordered: I ordered imaging studies including CT abd pelvis w contrast I independently visualized and interpreted imaging. I agree with the radiologist interpretation Additional history obtained from chart review, son and daughter-in-law at bedside.   Cardiac Monitoring: The patient was maintained on a cardiac monitor.  I personally viewed and interpreted the cardiac monitored which showed an underlying rhythm of:  NSR Reevaluation: After the interventions noted above, I reevaluated the patient and found that they have :improved Social Determinants of Health: Lives independently   Disposition:  Admit to hospitalist with surgery, gyn/onc following    Co morbidities that complicate the patient evaluation  Past Medical History:  Diagnosis Date   Abnormality of gait 02/07/2015   Acquired hallux rigidus of left foot 12/17/2020   Acquired unequal leg length on left 08/08/2011   AMS (altered mental status) 12/28/2020   Biceps tendonitis on left 08/18/2018   BPPV (benign paroxysmal positional vertigo) 02/07/2015   Bunion 12/17/2020   Cancer (HCC) 04/25/2022   Cerebrovascular disease    Cervical facet syndrome    Coronary artery disease 04/27/2022   Disorders of sacrum    Enthesopathy of hip region    Essential hypertension 02/07/2015   Generalized anxiety disorder    GERD (gastroesophageal reflux disease)    Headache    History of kidney stones    HLD (hyperlipidemia) 02/07/2015   Low back pain 02/07/2015   Lumbar facet arthropathy 07/05/2014   Lumbar post-laminectomy syndrome 06/11/2011   Lumbar radicular pain 07/29/2016   Lumbar radiculopathy 04/26/2018   Major depressive disorder    Mild cognitive impairment of uncertain or unknown etiology 01/24/2022   Multiple lacunar infarcts    MRI - bilateral basal ganglia and left thalamus   Myoclonus 09/19/2015   Rotator cuff tendonitis, left 06/08/2018   Sacroiliac joint dysfunction 06/11/2011   Sciatic neuritis    Stroke (HCC)    hx TIA   Subcortical infarction    2016 MRI - small chronic subcortical infarct with associated chronic hemosiderin deposition within the right superior frontal gyrus.   Synovitis and tenosynovitis    Therapeutic opioid induced constipation 07/25/2015   Thyroid disease    Transient left leg weakness    Vision  abnormalities      Medicines Meds ordered this encounter  Medications   metoCLOPramide (REGLAN)  injection 10 mg   pantoprazole (PROTONIX) injection 40 mg   iohexol (OMNIPAQUE) 300 MG/ML solution 100 mL   morphine (PF) 4 MG/ML injection 4 mg   DISCONTD: morphine (PF) 4 MG/ML injection 4 mg   DISCONTD: HYDROmorphone (DILAUDID) injection 0.5 mg   OR Linked Order Group    acetaminophen (TYLENOL) tablet 650 mg    acetaminophen (TYLENOL) suppository 650 mg   senna-docusate (Senokot-S) tablet 1 tablet   OR Linked Order Group    ondansetron (ZOFRAN) tablet 4 mg    ondansetron (ZOFRAN) injection 4 mg   magnesium sulfate IVPB 2 g 50 mL   potassium chloride 10 mEq in 100 mL IVPB   DISCONTD: HYDROmorphone (DILAUDID) injection 0.5 mg   sodium chloride flush (NS) 0.9 % injection 10-40 mL   sodium chloride flush (NS) 0.9 % injection 10-40 mL   Chlorhexidine Gluconate Cloth 2 % PADS 6 each   melatonin tablet 10 mg   traZODone (DESYREL) tablet 100 mg   fentaNYL (DURAGESIC) 50 MCG/HR 1 patch   gabapentin (NEURONTIN) capsule 600 mg   DISCONTD: oxyCODONE-acetaminophen (PERCOCET) 10-325 MG per tablet 1 tablet   HYDROmorphone (DILAUDID) injection 0.5 mg   AND Linked Order Group    oxyCODONE-acetaminophen (PERCOCET/ROXICET) 5-325 MG per tablet 1 tablet    oxyCODONE (Oxy IR/ROXICODONE) immediate release tablet 5 mg    I have reviewed the patients home medicines and have made adjustments as needed  Problem List / ED Course: Problem List Items Addressed This Visit       Other   * (Principal) Intraabdominal fluid collection - Primary   Other Visit Diagnoses     Partial small bowel obstruction (HCC)                       This note was created using dictation software, which may contain spelling or grammatical errors.    Loetta Rough, MD 01/11/23 2215

## 2023-01-11 NOTE — ED Notes (Signed)
ED TO INPATIENT HANDOFF REPORT  ED Nurse Name and Phone #: Darryll Capers, RN  S Name/Age/Gender Alexandria Mclaughlin 82 y.o. female Room/Bed: WA02/WA02  Code Status   Code Status: Limited: Do not attempt resuscitation (DNR) -DNR-LIMITED -Do Not Intubate/DNI   Home/SNF/Other Home Patient oriented to: self, place, time, and situation Is this baseline? Yes   Triage Complete: Triage complete  Chief Complaint Intraabdominal fluid collection [R18.8]  Triage Note Pt BIB [facility] from home d/t Nausea from Ovarian Cancer .  Onset today - pt received Zofran 4 mg IN IM.   Allergies No Known Allergies  Level of Care/Admitting Diagnosis ED Disposition     ED Disposition  Admit   Condition  --   Comment  Hospital Area: [facility] [[id number]]  Level of Care: Telemetry [5]  Admit to tele based on following criteria: Other see comments  Comments: Hypokalemia  May admit patient to [facility] or [facility] if equivalent level of care is available:: No  Covid Evaluation: Asymptomatic - no recent exposure (last 10 days) testing not required  Diagnosis: Intraabdominal fluid collection [[id number]]  Admitting Physician: Steffanie Rainwater [[id number]]  Attending Physician: Steffanie Rainwater [[id number]]  Certification:: I certify this patient will need inpatient services for at least 2 midnights  Expected Medical Readiness: 01/14/2023          B Medical/Surgery History Past Medical History:  Diagnosis Date   Abnormality of gait 02/07/2015   Acquired hallux rigidus of left foot 12/17/2020   Acquired unequal leg length on left 08/08/2011   AMS (altered mental status) 12/28/2020   Biceps tendonitis on left 08/18/2018   BPPV (benign paroxysmal positional vertigo) 02/07/2015   Bunion 12/17/2020   Cancer (HCC) 04/25/2022   Cerebrovascular disease    Cervical facet syndrome    Coronary artery disease 04/27/2022   Disorders of sacrum    Enthesopathy of hip region    Essential  hypertension 02/07/2015   Generalized anxiety disorder    GERD (gastroesophageal reflux disease)    Headache    History of kidney stones    HLD (hyperlipidemia) 02/07/2015   Low back pain 02/07/2015   Lumbar facet arthropathy 07/05/2014   Lumbar post-laminectomy syndrome 06/11/2011   Lumbar radicular pain 07/29/2016   Lumbar radiculopathy 04/26/2018   Major depressive disorder    Mild cognitive impairment of uncertain or unknown etiology 01/24/2022   Multiple lacunar infarcts    MRI - bilateral basal ganglia and left thalamus   Myoclonus 09/19/2015   Rotator cuff tendonitis, left 06/08/2018   Sacroiliac joint dysfunction 06/11/2011   Sciatic neuritis    Stroke (HCC)    hx TIA   Subcortical infarction    2016 MRI - small chronic subcortical infarct with associated chronic hemosiderin deposition within the right superior frontal gyrus.   Synovitis and tenosynovitis    Therapeutic opioid induced constipation 07/25/2015   Thyroid disease    Transient left leg weakness    Vision abnormalities    Past Surgical History:  Procedure Laterality Date   ABDOMINAL HYSTERECTOMY     Due to fibroids   BACK SURGERY  2011   lumbar Fusion   BLADDER SURGERY  1985   CESAREAN SECTION     x2   EYE SURGERY Bilateral    Cataract surgery with IOL   FOOT SURGERY Right    "just scraped the bone"   IR BLOCK INJ HYPOGASTRIC PLEXUS  12/23/2022   IR IMAGING GUIDED PORT INSERTION  05/26/2022  LAPAROSCOPY N/A 04/08/2022   Procedure: LAPAROSCOPY DIAGNOSTIC WITH BIOPSIES;  Surgeon: Clide Cliff, MD;  Location: [facility];  Service: Gynecology;  Laterality: N/A;   RETINAL DETACHMENT SURGERY Right 10/2011   TRANSFORAMINAL LUMBAR INTERBODY FUSION W/ MIS 1 LEVEL Right 04/26/2018   Procedure: Right Lumbar four-five Minimally invasive Transforaminal lumbar interbody fusion;  Surgeon: Jadene Pierini, MD;  Location: [facility] OR;  Service: Neurosurgery;  Laterality: Right;     A IV  Location/Drains/Wounds Patient Lines/Drains/Airways Status     Active Line/Drains/Airways     Name Placement date Placement time Site Days   Implanted Port 05/26/22 Right Chest 05/26/22  1339  Chest  230   Incision - 3 Ports 1: Left;Upper 2: Left;Medial 3: Left;Lower;Medial 04/08/22  --  -- 278            Intake/Output Last 24 hours No intake or output data in the 24 hours ending 01/11/23 1832  Labs/Imaging Results for orders placed or performed during the hospital encounter of 01/11/23 (from the past 48 hour(s))  CBC with Differential     Status: Abnormal   Collection Time: 01/11/23  2:25 PM  Result Value Ref Range   WBC 13.8 (H) 4.0 - 10.5 K/uL   RBC 2.95 (L) 3.87 - 5.11 MIL/uL   Hemoglobin 8.9 (L) 12.0 - 15.0 g/dL   HCT 81.1 (L) 91.4 - 78.2 %   MCV 95.6 80.0 - 100.0 fL   MCH 30.2 26.0 - 34.0 pg   MCHC 31.6 30.0 - 36.0 g/dL   RDW 95.6 21.3 - 08.6 %   Platelets 247 150 - 400 K/uL   nRBC 0.0 0.0 - 0.2 %   Neutrophils Relative % 84 %   Neutro Abs 11.7 (H) 1.7 - 7.7 K/uL   Lymphocytes Relative 8 %   Lymphs Abs 1.1 0.7 - 4.0 K/uL   Monocytes Relative 7 %   Monocytes Absolute 1.0 0.1 - 1.0 K/uL   Eosinophils Relative 0 %   Eosinophils Absolute 0.0 0.0 - 0.5 K/uL   Basophils Relative 0 %   Basophils Absolute 0.0 0.0 - 0.1 K/uL   Immature Granulocytes 1 %   Abs Immature Granulocytes 0.08 (H) 0.00 - 0.07 K/uL    Comment: Performed at [facility], [address] [place], [place] [address]  Comprehensive metabolic panel     Status: Abnormal   Collection Time: 01/11/23  2:25 PM  Result Value Ref Range   Sodium 137 135 - 145 mmol/L   Potassium 2.8 (L) 3.5 - 5.1 mmol/L   Chloride 97 (L) 98 - 111 mmol/L   CO2 24 22 - 32 mmol/L   Glucose, Bld 109 (H) 70 - 99 mg/dL    Comment: Glucose reference range applies only to samples taken after fasting for at least 8 hours.   BUN 19 8 - 23 mg/dL   Creatinine, Ser 9.62 0.44 - 1.00 mg/dL   Calcium 9.5 8.9 - 95.2  mg/dL   Total Protein 6.2 (L) 6.5 - 8.1 g/dL   Albumin 2.5 (L) 3.5 - 5.0 g/dL   AST 26 15 - 41 U/L   ALT 20 0 - 44 U/L   Alkaline Phosphatase 177 (H) 38 - 126 U/L   Total Bilirubin 0.8 <1.2 mg/dL   GFR, Estimated >84 >13 mL/min    Comment: (NOTE) Calculated using the CKD-EPI Creatinine Equation (2021)    Anion gap 16 (H) 5 - 15    Comment: Performed at [facility], [address] [place], [place] [address]  Magnesium     Status: Abnormal   Collection Time: 01/11/23  2:25 PM  Result Value Ref Range   Magnesium 1.5 (L) 1.7 - 2.4 mg/dL    Comment: Performed at [facility], [address], [place], [place] [address]  I-Stat CG4 Lactic Acid, ED     Status: None   Collection Time: 01/11/23  3:03 PM  Result Value Ref Range   Lactic Acid, Venous 1.4 0.5 - 1.9 mmol/L   *Note: Due to a large number of results and/or encounters for the requested time period, some results have not been displayed. A complete set of results can be found in Results Review.   CT ABDOMEN PELVIS W CONTRAST  Result Date: 01/11/2023 CLINICAL DATA:  Nausea and vomiting.  History of ovarian cancer. EXAM: CT ABDOMEN AND PELVIS WITH CONTRAST TECHNIQUE: Multidetector CT imaging of the abdomen and pelvis was performed using the standard protocol following bolus administration of intravenous contrast. RADIATION DOSE REDUCTION: This exam was performed according to the departmental dose-optimization program which includes automated exposure control, adjustment of the mA and/or kV according to patient size and/or use of iterative reconstruction technique. CONTRAST:  OMNIPAQUE IOHEXOL 300 MG/ML  SOLN COMPARISON:  December 19, 2022 FINDINGS: Lower chest: No acute abnormality. Hepatobiliary: New subcapsular fluid collection in the right liver measures water density, and measures 11.9 x 5.9 x 15 cm. Otherwise normal attenuation of the liver. The gallbladder is normally distended. Pancreas:  Unremarkable. No pancreatic ductal dilatation or surrounding inflammatory changes. Spleen: Normal in size without focal abnormality. Adrenals/Urinary Tract: Adrenal glands are unremarkable. Kidneys are normal, without renal calculi, focal lesion, or hydronephrosis. Bladder is unremarkable. Stomach/Bowel: Sub pathologic gas and fluid distension of small bowels in the mid abdomen, which measure up to 2.8 cm. Preserved colonic pattern. Vascular/Lymphatic: Aortic atherosclerosis. No enlarged abdominal or pelvic lymph nodes. Reproductive: Right adnexal cystic and solid mass measures 3.8 x 4.2 x 2.7 cm., slightly enlarged. Loculated tubular fluid collection is seen extending from the mass to the central pelvis. Post hysterectomy. Other: No abdominal wall hernia. Musculoskeletal: Sclerotic metastatic disease in the thoracolumbar spine is mostly stable. No new compression fractures. Chronic moderate L1 compression deformity. IMPRESSION: 1. New subcapsular fluid collection in the right liver measures water density, and measures 11.9 x 5.9 x 15 cm. 2. Sub pathologic gas and fluid distension of small bowels in the mid abdomen, with cross-section measuring up to 2.8 cm. Findings are concerning for early or partial small bowel obstruction. 3. Right adnexal cystic and solid mass measures 3.8 x 4.2 x 2.7 cm, slightly enlarged. Loculated tubular fluid collection is seen extending from the mass to the central pelvis, also slightly enlarged. 4. Sclerotic metastatic disease in the thoracolumbar spine is mostly stable. 5. Chronic moderate L1 compression deformity. 6. Aortic atherosclerosis. Aortic Atherosclerosis (ICD10-I70.0). Electronically Signed   By: Ted Mcalpine M.D.   On: 01/11/2023 16:03    Pending Labs Unresulted Labs (From admission, onward)     Start     Ordered   01/11/23 1441  Urinalysis, w/ Reflex to Culture (Infection Suspected) -Urine, Clean Catch  Once,   URGENT       Question:  Specimen Source  Answer:   Urine, Clean Catch   01/11/23 1440            Vitals/Pain Today's Vitals   01/11/23 1630 01/11/23 1630 01/11/23 1707 01/11/23 1815  BP: (!) 147/99     Pulse: Marland Kitchen)  103     Resp: 16     Temp:   98 F (36.7 C)   TempSrc:   Oral   SpO2: 96%     Weight:      Height:      PainSc:  6   10-Worst pain ever    Isolation Precautions No active isolations  Medications Medications  HYDROmorphone (DILAUDID) injection 0.5 mg (0.5 mg Intravenous Given 01/11/23 1831)  acetaminophen (TYLENOL) tablet 650 mg (has no administration in time range)    Or  acetaminophen (TYLENOL) suppository 650 mg (has no administration in time range)  senna-docusate (Senokot-S) tablet 1 tablet (has no administration in time range)  ondansetron (ZOFRAN) tablet 4 mg (has no administration in time range)    Or  ondansetron (ZOFRAN) injection 4 mg (has no administration in time range)  metoCLOPramide (REGLAN) injection 10 mg (10 mg Intravenous Given 01/11/23 1444)  pantoprazole (PROTONIX) injection 40 mg (40 mg Intravenous Given 01/11/23 1444)  iohexol (OMNIPAQUE) 300 MG/ML solution 100 mL (100 mLs Intravenous Contrast Given 01/11/23 1510)  morphine (PF) 4 MG/ML injection 4 mg (4 mg Intravenous Given 01/11/23 1628)    Mobility walks with device     Focused Assessments GI   R Recommendations: See Admitting Provider Note  Report given to:   Additional Notes: Pt was just given 0.5 Dilaudid

## 2023-01-11 NOTE — ED Notes (Signed)
7:24 PM  Patient transported to admission bed.

## 2023-01-11 NOTE — H&P (Addendum)
History and Physical    Patient: Alexandria Mclaughlin WJX:[id number] DOB: 1941-02-10 DOA: 01/11/2023 DOS: the patient was seen and examined on 01/11/2023 PCP: Patient, No Pcp Per  Patient coming from: Home  Chief Complaint: Nausea  HPI: Alexandria Mclaughlin is a 82 y.o. female with medical history significant of metastatic ovarian cancer with peritoneal carcinomatosis, mets to lumbar and thoracic spine, DDD, CAD, HTN, GERD, HLD, prior spinal fusion, anxiety and depression and CVA, and insomnia who presents to the ED for evaluation of nausea. Patient reports that over the last week, she has become nauseated and has had multiple emesis. She has had difficulty keeping anything down as well as poor appetite. She is only been able to tolerate some more things and Jell-O over the last week. States her back pain has been worsening over the last few weeks. She also reports some weight loss as well as intermittent night sweats but denies any abdominal pain, fevers, chills, headaches, dizziness, dysuria, hematuria or bloody stools.  Of note, patient was hospitalized from 10/19/22 to 12/27/22 for intractable abdominal pain related to her advancing metastatic ovarian cancer with peritoneal carcinomatosis. She is s/p a superior hypogastric plexus block with IR with improving pain since then. She was also diagnosed with UTI on 11/12 and prescribed Keflex which was eventually changed to Bactrim after the results of the urine culture came back.  States she was unable to keep much of the antibiotics down due to the vomiting.  ED course: Tachycardic to HR in the 100 but overall normal vitals. Labs show WBC 13.8, Hgb 8.9, platelet 247, K+ 2.8, creatinine 0.53, albumin 2.5, AST/ALT 26/20, alk phos 177, mag 1.5, lactic acid 1.4, UA with ketonuria but no evidence of infection. CT A/P showed new subscapular 11.9 x 5.9 x 15 cm fluid collection in the right liver, evidence of early or partial small bowel obstruction, slightly enlarged right  adnexal cystic and solid mass measuring 3.8 x 4.2 x 2.7 cm with associated loculated tubular fluid collection extending to the central pelvis, stable sclerotic metastatic disease in the thoracolumbar spine and a chronic moderate L1 compression deformity. General surgery was consulted by ER doc for possible subscapular liver hematoma and they recommended close monitoring due to stable hemoglobin. Gyn/Onc was consulted and plans to see patient in the morning [facility] was consulted for admission   Review of Systems: As mentioned in the history of present illness. All other systems reviewed and are negative. Past Medical History:  Diagnosis Date   Abnormality of gait 02/07/2015   Acquired hallux rigidus of left foot 12/17/2020   Acquired unequal leg length on left 08/08/2011   AMS (altered mental status) 12/28/2020   Biceps tendonitis on left 08/18/2018   BPPV (benign paroxysmal positional vertigo) 02/07/2015   Bunion 12/17/2020   Cancer (HCC) 04/25/2022   Cerebrovascular disease    Cervical facet syndrome    Coronary artery disease 04/27/2022   Disorders of sacrum    Enthesopathy of hip region    Essential hypertension 02/07/2015   Generalized anxiety disorder    GERD (gastroesophageal reflux disease)    Headache    History of kidney stones    HLD (hyperlipidemia) 02/07/2015   Low back pain 02/07/2015   Lumbar facet arthropathy 07/05/2014   Lumbar post-laminectomy syndrome 06/11/2011   Lumbar radicular pain 07/29/2016   Lumbar radiculopathy 04/26/2018   Major depressive disorder    Mild cognitive impairment of uncertain or unknown etiology 01/24/2022   Multiple lacunar infarcts  MRI - bilateral basal ganglia and left thalamus   Myoclonus 09/19/2015   Rotator cuff tendonitis, left 06/08/2018   Sacroiliac joint dysfunction 06/11/2011   Sciatic neuritis    Stroke [facility])    hx TIA   Subcortical infarction    2016 MRI - small chronic subcortical infarct with associated chronic  hemosiderin deposition within the right superior frontal gyrus.   Synovitis and tenosynovitis    Therapeutic opioid induced constipation 07/25/2015   Thyroid disease    Transient left leg weakness    Vision abnormalities    Past Surgical History:  Procedure Laterality Date   ABDOMINAL HYSTERECTOMY     Due to fibroids   BACK SURGERY  2011   lumbar Fusion   BLADDER SURGERY  1985   CESAREAN SECTION     x2   EYE SURGERY Bilateral    Cataract surgery with IOL   FOOT SURGERY Right    "just scraped the bone"   IR BLOCK INJ HYPOGASTRIC PLEXUS  12/23/2022   IR IMAGING GUIDED PORT INSERTION  05/26/2022   LAPAROSCOPY N/A 04/08/2022   Procedure: LAPAROSCOPY DIAGNOSTIC WITH BIOPSIES;  Surgeon: Clide Cliff, MD;  Location: [facility] ORS;  Service: Gynecology;  Laterality: N/A;   RETINAL DETACHMENT SURGERY Right 10/2011   TRANSFORAMINAL LUMBAR INTERBODY FUSION W/ MIS 1 LEVEL Right 04/26/2018   Procedure: Right Lumbar four-five Minimally invasive Transforaminal lumbar interbody fusion;  Surgeon: Jadene Pierini, MD;  Location: [facility] OR;  Service: Neurosurgery;  Laterality: Right;   Social History:  reports that she quit smoking about 54 years ago. Her smoking use included cigarettes. She started smoking about 69 years ago. She has a 3.8 pack-year smoking history. She has never used smokeless tobacco. She reports that she does not currently use alcohol. She reports current drug use. Frequency: 20.00 times per week. Drugs: Fentanyl and Hydrocodone.  Lives with son.  No Known Allergies  Family History  Problem Relation Age of Onset   Diabetes Mother    Heart disease Mother    Stroke Mother    Heart disease Father    Heart disease Brother    Colon cancer Neg Hx    Breast cancer Neg Hx    Ovarian cancer Neg Hx    Endometrial cancer Neg Hx    Pancreatic cancer Neg Hx    Prostate cancer Neg Hx     Prior to Admission medications   Medication Sig Start Date End Date Taking? Authorizing Provider   acetaminophen (TYLENOL) 325 MG tablet Take 2 tablets (650 mg total) by mouth every 8 (eight) hours as needed for headache, fever or mild pain (pain score 1-3). 12/27/22 12/27/23  Gillis Santa, MD  ascorbic acid (VITAMIN C) 500 MG tablet Take 1 tablet (500 mg total) by mouth daily. 12/28/22 03/28/23  Gillis Santa, MD  cephALEXin (KEFLEX) 500 MG capsule Take 1 capsule (500 mg total) by mouth 4 (four) times daily. 01/06/23   Rexford Maus, DO  Cholecalciferol (VITAMIN D) 50 MCG (2000 UT) CAPS Take 2,000 Units by mouth daily.    [provider]  clonazePAM (KLONOPIN) 0.5 MG tablet Take 1 tablet (0.5 mg total) by mouth at bedtime as needed for anxiety. 11/21/22   Raulkar, Drema Pry, MD  COMIRNATY syringe  11/06/22   [provider]  cyanocobalamin 1000 MCG tablet Take 1,000 mcg by mouth daily.    [provider]  dexamethasone (DECADRON) 4 MG tablet Take 2 tabs at the night before and 2 tab the morning  of chemotherapy, every 3 weeks, by mouth x 6 cycles 04/22/22   Artis Delay, MD  DULoxetine (CYMBALTA) 60 MG capsule TAKE 1 CAPSULE BY MOUTH EVERY DAY Patient taking differently: Take 60 mg by mouth daily. 08/12/22   Ranelle Oyster, MD  fentaNYL (DURAGESIC) 50 MCG/HR Place 1 patch onto the skin every 3 (three) days. 01/07/23   Ranelle Oyster, MD  FLUZONE HIGH-DOSE 0.5 ML injection  11/06/22   [provider]  gabapentin (NEURONTIN) 600 MG tablet Take 1 tablet (600 mg total) by mouth 4 (four) times daily. 01/07/23   Ranelle Oyster, MD  irbesartan (AVAPRO) 150 MG tablet Take 150 mg by mouth daily.    [provider]  iron polysaccharides (NIFEREX) 150 MG capsule Take 1 capsule (150 mg total) by mouth daily. 12/28/22 03/28/23  Gillis Santa, MD  lactulose (CHRONULAC) 10 GM/15ML solution TAKE 15 MLS (10 G TOTAL) BY MOUTH 3 (THREE) TIMES DAILY. 08/20/22   Artis Delay, MD  lidocaine-prilocaine (EMLA) cream APPLY TO AFFECTED AREA ONCE AS DIRECTED Patient taking  differently: Apply 1 Application topically See admin instructions. Apply to port before Chemo 12/15/22   Artis Delay, MD  magnesium oxide (MAG-OX) 400 (240 Mg) MG tablet TAKE 1 TABLET BY MOUTH EVERY DAY Patient taking differently: Take 400 mg by mouth daily. 12/08/22   Artis Delay, MD  Melatonin 10 MG TABS Take 10 mg by mouth at bedtime as needed (sleep).    [provider]  methimazole (TAPAZOLE) 5 MG tablet Take 5 mg by mouth See admin instructions. 3 times a week only    [provider]  ondansetron (ZOFRAN) 8 MG tablet Take 1 tablet (8 mg total) by mouth every 8 (eight) hours as needed for nausea or vomiting. Start on the third day after carboplatin. 04/22/22   Artis Delay, MD  oxyCODONE-acetaminophen (PERCOCET) 10-325 MG tablet Take 1 tablet by mouth every 6 (six) hours as needed for pain. 01/07/23   Ranelle Oyster, MD  pantoprazole (PROTONIX) 40 MG tablet Take 1 tablet (40 mg total) by mouth daily for 14 days. 12/28/22 01/11/23  Gillis Santa, MD  polyethylene glycol (MIRALAX / GLYCOLAX) 17 g packet Take 17 g by mouth 2 (two) times daily.    [provider]  pregabalin (LYRICA) 50 MG capsule Take 1 capsule (50 mg total) by mouth 3 (three) times daily. 11/19/22   Ranelle Oyster, MD  prochlorperazine (COMPAZINE) 10 MG tablet TAKE 1 TABLET BY MOUTH EVERY 6 HOURS AS NEEDED FOR NAUSEA OR VOMITING. Patient taking differently: Take 10 mg by mouth every 6 (six) hours as needed for nausea. 11/03/22   Artis Delay, MD  rosuvastatin (CRESTOR) 40 MG tablet Take 1 tablet (40 mg total) by mouth at bedtime. 05/05/22   Parke Poisson, MD  senna (SENOKOT) 8.6 MG tablet Take 2 tablets by mouth 2 (two) times daily.    [provider]  traZODone (DESYREL) 100 MG tablet TAKE 1 TABLET BY MOUTH EVERYDAY AT BEDTIME Patient taking differently: Take 100 mg by mouth at bedtime. 09/15/22   Jones Bales, NP    Physical Exam: Vitals:   01/11/23 1356 01/11/23 1445 01/11/23 1630  01/11/23 1707  BP:  (!) 152/94 (!) 147/99   Pulse:  (!) 107 (!) 103   Resp:  14 16   Temp:    98 F (36.7 C)  TempSrc:    Oral  SpO2:  97% 96%   Weight: 69.8 kg  Height: 5\' 3"  (1.6 m)      General: Pleasant, chronically ill elderly woman laying in bed. No acute distress. HEENT: [place]/AT. Anicteric sclera. Dry mucous membrane. CV: Tachycardic. Regular rhythm. No murmurs, rubs, or gallops. No LE edema Pulmonary: Lungs CTAB. Normal effort. No wheezing or rales. Abdominal: Soft, nontender, nondistended. Hypoactive bowel sounds Extremities: Palpable radial and DP pulses. Normal ROM. Skin: Warm and dry. No obvious rash or lesions. Neuro: A&Ox3. Moves all extremities. Normal sensation to light touch. No focal deficit. Psych: Normal mood and affect  Data Reviewed:  WBC 13.8, Hgb 8.9, platelet 247, K+ 2.8, creatinine 0.53, albumin 2.5, AST/ALT 26/20, alk phos 177, mag 1.5, lactic acid 1.4, UA with ketonuria but no evidence of infection. CT A/P showed new subscapular 11.9 x 5.9 x 15 cm fluid collection in the right liver, evidence of early or partial small bowel obstruction, slightly enlarged right adnexal cystic and solid mass measuring 3.8 x 4.2 x 2.7 cm with associated loculated tubular fluid collection extending to the central pelvis, stable sclerotic metastatic disease in the thoracolumbar spine and a chronic moderate L1 compression deformity.  Assessment and Plan: Alexandria Mclaughlin is a 82 y.o. female with medical history significant of metastatic ovarian cancer with peritoneal carcinomatosis, mets to lumbar and thoracic spine, DDD, CAD, HTN, GERD, HLD, prior spinal fusion, anxiety and depression and CVA, and insomnia who presents to the ED for evaluation of nausea and found to have intra-abdominal fluid collection and partial small bowel obstruction.  # Intra-abdominal fluid collection Patient with metastatic ovarian cancer with peritoneal carcinomatosis complicated by mets to the lumbar and  thoracic spine presented with a week of nausea, vomiting and worsening back pain and found to have new subscapular 11.9 x 5.9 x 15 cm fluid collection in the right liver. She denies any abdominal pain but endorses persistent nausea and heartburn.  Alk phos elevated to 177 however normal LFTs and bilirubin. Subscapular liver hematoma not completely ruled out. WBC elevated to 13.8 but hemoglobin stable at 8.9 (baseline around 9-10).  -General Surgery consulted, appreciate recs -As needed Zofran for nausea and vomiting -Trend CBC, fever curve  # Partial small bowel obstruction Patient has had 1 week of nausea and vomiting but denies any abdominal pain. Abdomen not tender or distended on exam. Hypoactive bowel sounds. Will continue medical management for now. -General Surgery consulted, appreciate recs -Keep n.p.o. -Trend and replete electrolytes  # Metastatic ovarian cancer Patient reports she was diagnosed in January. Her last chemotherapy was 1 month ago but currently, she is unsure if she wants to continue chemotherapy. Imaging on admission shows slightly enlarged right adnexal cystic and solid mass measuring 3.8 x 4.2 x 2.7 cm with associated loculated tubular fluid collection extending to the central pelvis, and stable sclerotic metastatic disease in the thoracolumbar spine. She reports worsening back pain over the last week. -Gynecological oncology consulted, will see in a.m. -Resume home fentanyl patch 50 mcg every 3 days -Resume home Percocet 10-325 mg q6h prn for mod pain -IV Dilaudid 0.5 mg q6h prn for severe pain -Resume home gabapentin 600 mg 4 times daily  # Hypokalemia Found to have K+ of 2.8 on admission likely secondary to her 1 week of nausea and vomiting. Kidney function normal. -Repleting with IV KCL -Follow-up AM BMP  # Hypomagnesemia Mag of 1.5 on admission. -IV mag 2 g x1 -Follow-up a.m. mag  # HTN BP elevated with SBP in the 130s to 160s -Resume home Irbesartan 150  mg  daily  # Anxiety and depression -Duloxetine 60 mg daily -Clonazepam 0.5 mg as needed for anxiety  # HLD -Rosuvastatin 40 mg daily  # IDA -Niferex 150 mg daily  # Insomnia -Trazodone 100 mg daily at bedtime -Melatonin 10 mg daily as needed for sleep  # GERD -Protonix 40 mg daily -Tums 1 tablet twice daily    Advance Care Planning:   Code Status: Limited: Do not attempt resuscitation (DNR) -DNR-LIMITED -Do Not Intubate/DNI    Consults: General Surgery, gynecologic oncology  Family Communication: Discussed admission with son at bedside  Severity of Illness: The appropriate patient status for this patient is INPATIENT. Inpatient status is judged to be reasonable and necessary in order to provide the required intensity of service to ensure the patient's safety. The patient's presenting symptoms, physical exam findings, and initial radiographic and laboratory data in the context of their chronic comorbidities is felt to place them at high risk for further clinical deterioration. Furthermore, it is not anticipated that the patient will be medically stable for discharge from the hospital within 2 midnights of admission.   * I certify that at the point of admission it is my clinical judgment that the patient will require inpatient hospital care spanning beyond 2 midnights from the point of admission due to high intensity of service, high risk for further deterioration and high frequency of surveillance required.*  Author: Steffanie Rainwater, MD 01/11/2023 5:45 PM  For on call review [email or web address].

## 2023-01-12 DIAGNOSIS — K56600 Partial intestinal obstruction, unspecified as to cause: Secondary | ICD-10-CM

## 2023-01-12 DIAGNOSIS — R188 Other ascites: Secondary | ICD-10-CM | POA: Diagnosis not present

## 2023-01-12 LAB — COMPREHENSIVE METABOLIC PANEL
ALT: 17 U/L (ref 0–44)
AST: 20 U/L (ref 15–41)
Albumin: 2.3 g/dL — ABNORMAL LOW (ref 3.5–5.0)
Alkaline Phosphatase: 150 U/L — ABNORMAL HIGH (ref 38–126)
Anion gap: 15 (ref 5–15)
BUN: 17 mg/dL (ref 8–23)
CO2: 21 mmol/L — ABNORMAL LOW (ref 22–32)
Calcium: 8.9 mg/dL (ref 8.9–10.3)
Chloride: 97 mmol/L — ABNORMAL LOW (ref 98–111)
Creatinine, Ser: 0.43 mg/dL — ABNORMAL LOW (ref 0.44–1.00)
GFR, Estimated: >60 mL/min (ref >60)
Glucose, Bld: 88 mg/dL (ref 70–99)
Potassium: 4 mmol/L (ref 3.5–5.1)
Sodium: 133 mmol/L — ABNORMAL LOW (ref 135–145)
Total Bilirubin: 1.1 mg/dL (ref <1.2)
Total Protein: 5.6 g/dL — ABNORMAL LOW (ref 6.5–8.1)

## 2023-01-12 LAB — CBC
HCT: 29.8 % — ABNORMAL LOW (ref 36.0–46.0)
Hemoglobin: 9.1 g/dL — ABNORMAL LOW (ref 12.0–15.0)
MCH: 30.4 pg (ref 26.0–34.0)
MCHC: 30.5 g/dL (ref 30.0–36.0)
MCV: 99.7 fL (ref 80.0–100.0)
Platelets: 185 10*3/uL (ref 150–400)
RBC: 2.99 MIL/uL — ABNORMAL LOW (ref 3.87–5.11)
RDW: 15.6 % — ABNORMAL HIGH (ref 11.5–15.5)
WBC: 10.5 10*3/uL (ref 4.0–10.5)
nRBC: 0 % (ref 0.0–0.2)

## 2023-01-12 LAB — PHOSPHORUS: Phosphorus: 1.9 mg/dL — ABNORMAL LOW (ref 2.5–4.6)

## 2023-01-12 LAB — MAGNESIUM: Magnesium: 1.8 mg/dL (ref 1.7–2.4)

## 2023-01-12 LAB — LIPASE, BLOOD: Lipase: 45 U/L (ref 11–51)

## 2023-01-12 MED ORDER — LACTATED RINGERS IV SOLN: INTRAVENOUS | Status: DC

## 2023-01-12 MED ORDER — LACTATED RINGERS IV SOLN: INTRAVENOUS | Status: AC

## 2023-01-12 MED ORDER — SENNOSIDES-DOCUSATE SODIUM 8.6-50 MG PO TABS
1.0000 | ORAL_TABLET | Freq: Two times a day (BID) | ORAL | Status: DC
  Administered 2023-01-12 – 2023-01-15 (×5): 1 via ORAL
  Filled 2023-01-12 (×6): qty 1

## 2023-01-12 MED ORDER — PROCHLORPERAZINE EDISYLATE 10 MG/2ML IJ SOLN
10.0000 mg | Freq: Four times a day (QID) | INTRAMUSCULAR | Status: DC | PRN
  Administered 2023-01-12 – 2023-01-16 (×4): 10 mg via INTRAVENOUS
  Filled 2023-01-12 (×5): qty 2

## 2023-01-12 MED ORDER — OXYCODONE HCL 5 MG PO TABS
10.0000 mg | ORAL_TABLET | ORAL | Status: DC | PRN
  Administered 2023-01-12 – 2023-01-15 (×5): 10 mg via ORAL
  Filled 2023-01-12 (×5): qty 2

## 2023-01-12 MED ORDER — BISACODYL 10 MG RE SUPP
10.0000 mg | Freq: Once | RECTAL | Status: DC
  Filled 2023-01-12: qty 1

## 2023-01-12 NOTE — Progress Notes (Signed)
[facility] Progress Note Patient: Alexandria Mclaughlin ZOX:[id number] DOB: 01/29/1941 DOA: 01/11/2023  DOS: the patient was seen and examined on 01/12/2023  Brief hospital course: PMH of metastatic high-grade ovarian cancer on chemotherapy, BPPV, CVA, CAD, GERD, HLD, hypothyroidism.  Presented to the hospital with complaints of nausea and vomiting as well as abdominal pain. Workup in the ED showed evidence of SBO as well as new subcapsular perihepatic fluid collection.  Radiation GYN oncology was consulted. General surgery was consulted as well. Currently plan is conservative management. Assessment and Plan: Possible SBO versus severe constipation. Ongoing complaint of nausea vomiting as well as abdominal pain. Reports she is not passing any gas today. General surgery was consulted and highly appreciate their assistance. At present recommendation is for bowel rest and supportive care. Will continue with suppository for now. May require enema.  Subcapsular fluid collection of the liver. Patient has history of peritoneal carcinomatosis. Fluid collection could be multifactorial. Possible hematoma from recent fall cannot be ruled out. Given this stable hemoglobin no further workup recommended.  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 SP neoadjuvant chemotherapy. Sees Dr. Bertis Ruddy as outpatient. Evaluated by GYN oncology.  No workup recommended for now.  Chronic pain syndrome. Likely because of her severe constipation. Home regimen includes Cymbalta, fentanyl patch 50 mcg, gabapentin 600 mg 4 times daily, Percocet 10/325 every 6 hours as needed.  Lyrica 50 mg 3 times daily. For now we will monitor.  HLD. On Crestor. Currently on hold.  HTN. Due to patient's n.p.o. status as well as blood pressure stability, currently holding home regimen.  GERD. PPI.  Iron deficiency anemia. On supplements. Monitor.  Hypokalemia.  Hypomagnesemia Currently being  replaced. Monitor.  Subjective: Ongoing complaint of abdominal pain as well as nausea.  No vomiting so far.  Also complains of not passing any gas.  No fever no chills.  Physical Exam: General: in Mild distress, No Rash Cardiovascular: S1 and S2 Present, No Murmur Respiratory: Good respiratory effort, Bilateral Air entry present. No Crackles, No wheezes Abdomen: Bowel Sound present, mild diffuse tenderness Extremities: No edema Neuro: Alert and oriented x3, no new focal deficit  Data Reviewed: I have Reviewed nursing notes, Vitals, and Lab results. Since last encounter, pertinent lab results CBC and BMP   . I have ordered test including CBC and BMP  . I have discussed pt's care plan and test results with GYN oncology as well as general surgery  .  X-Kuznia abdomen ordered for tomorrow.  Disposition: Status is: Inpatient Remains inpatient appropriate because: Need to tolerate oral diet.  SCDs Start: 01/11/23 1824   Family Communication: No one at bedside Level of care: Telemetry continue for now due to hypokalemia. Vitals:   01/12/23 0509 01/12/23 0936 01/12/23 1454 01/12/23 1725  BP: (!) 100/55 127/64 119/67 129/85  Pulse: 73 92 84 100  Resp: 14 14 14 14   Temp: 97.6 F (36.4 C) 97.7 F (36.5 C) 97.9 F (36.6 C) 97.6 F (36.4 C)  TempSrc: Oral Oral Oral Oral  SpO2: 96% 100% 94% 99%  Weight:      Height:         Author: Lynden Oxford, MD 01/12/2023 7:03 PM  Please look on [email or web address] to find out who is on call.

## 2023-01-12 NOTE — Consult Note (Addendum)
Alexandria Mclaughlin 1940/09/17  [id number].    Requesting MD: Allena Katz, MD Chief Complaint/Reason for Consult: possible SBO, new subcapsular perihepatic fluid collection  HPI:  Alexandria Mclaughlin is an 82 y/o F with high grade ovarian cancer on chemotherapy and recent history of UTI who presents with 24 hours of nausea, vomiting, and abdominal pain. When I ask her about her abdominal pain she says it is all over but worse on the left. She tells me that when she had a fall at home 2 weeks ago she landed on her left side. Her main complaint is nausea/intolerance of PO and she tells me that she overall fells like her pain has improved she she had a hypogastric plexus block by IR 10/29. She reports belching and is unsure of the last time she had flatus. She states her last BM was 11/17 in the morning. At baseline she had a BM about every 3 days after taking dulcolax/other laxatives. She denies hematemesis or coffee ground emesis. She denies black stool yesterday but does tell me that 2 weeks ago she had black stools that improved with stopping her PO iron therapy. She reports a remote history of gastric ulcer, so many years ago she can't remember. At baseline she is able to mobilize independently.  Denies fever, chills, urinary sxs.  Denies use of blood thinners.   ROS: Review of Systems  All other systems reviewed and are negative.   Family History  Problem Relation Age of Onset   Diabetes Mother    Heart disease Mother    Stroke Mother    Heart disease Father    Heart disease Brother    Colon cancer Neg Hx    Breast cancer Neg Hx    Ovarian cancer Neg Hx    Endometrial cancer Neg Hx    Pancreatic cancer Neg Hx    Prostate cancer Neg Hx     Past Medical History:  Diagnosis Date   Abnormality of gait 02/07/2015   Acquired hallux rigidus of left foot 12/17/2020   Acquired unequal leg length on left 08/08/2011   AMS (altered mental status) 12/28/2020   Biceps tendonitis on left 08/18/2018   BPPV  (benign paroxysmal positional vertigo) 02/07/2015   Bunion 12/17/2020   Cancer (HCC) 04/25/2022   Cerebrovascular disease    Cervical facet syndrome    Coronary artery disease 04/27/2022   Disorders of sacrum    Enthesopathy of hip region    Essential hypertension 02/07/2015   Generalized anxiety disorder    GERD (gastroesophageal reflux disease)    Headache    History of kidney stones    HLD (hyperlipidemia) 02/07/2015   Low back pain 02/07/2015   Lumbar facet arthropathy 07/05/2014   Lumbar post-laminectomy syndrome 06/11/2011   Lumbar radicular pain 07/29/2016   Lumbar radiculopathy 04/26/2018   Major depressive disorder    Mild cognitive impairment of uncertain or unknown etiology 01/24/2022   Multiple lacunar infarcts    MRI - bilateral basal ganglia and left thalamus   Myoclonus 09/19/2015   Rotator cuff tendonitis, left 06/08/2018   Sacroiliac joint dysfunction 06/11/2011   Sciatic neuritis    Stroke (HCC)    hx TIA   Subcortical infarction    2016 MRI - small chronic subcortical infarct with associated chronic hemosiderin deposition within the right superior frontal gyrus.   Synovitis and tenosynovitis    Therapeutic opioid induced constipation 07/25/2015   Thyroid disease    Transient left leg weakness  Vision abnormalities     Past Surgical History:  Procedure Laterality Date   ABDOMINAL HYSTERECTOMY     Due to fibroids   BACK SURGERY  2011   lumbar Fusion   BLADDER SURGERY  1985   CESAREAN SECTION     x2   EYE SURGERY Bilateral    Cataract surgery with IOL   FOOT SURGERY Right    "just scraped the bone"   IR BLOCK INJ HYPOGASTRIC PLEXUS  12/23/2022   IR IMAGING GUIDED PORT INSERTION  05/26/2022   LAPAROSCOPY N/A 04/08/2022   Procedure: LAPAROSCOPY DIAGNOSTIC WITH BIOPSIES;  Surgeon: Clide Cliff, MD;  Location: [facility];  Service: Gynecology;  Laterality: N/A;   RETINAL DETACHMENT SURGERY Right 10/2011   TRANSFORAMINAL LUMBAR INTERBODY FUSION W/ MIS  1 LEVEL Right 04/26/2018   Procedure: Right Lumbar four-five Minimally invasive Transforaminal lumbar interbody fusion;  Surgeon: Jadene Pierini, MD;  Location: [facility];  Service: Neurosurgery;  Laterality: Right;    Social History:  reports that she quit smoking about 54 years ago. Her smoking use included cigarettes. She started smoking about 69 years ago. She has a 3.8 pack-year smoking history. She has never used smokeless tobacco. She reports that she does not currently use alcohol. She reports current drug use. Frequency: 20.00 times per week. Drugs: Fentanyl and Hydrocodone.  Allergies: No Known Allergies  Medications Prior to Admission  Medication Sig Dispense Refill   acetaminophen (TYLENOL) 325 MG tablet Take 2 tablets (650 mg total) by mouth every 8 (eight) hours as needed for headache, fever or mild pain (pain score 1-3). 100 tablet 3   ascorbic acid (VITAMIN C) 500 MG tablet Take 1 tablet (500 mg total) by mouth daily. 30 tablet 2   cephALEXin (KEFLEX) 500 MG capsule Take 1 capsule (500 mg total) by mouth 4 (four) times daily. 20 capsule 0   Cholecalciferol (VITAMIN D) 50 MCG (2000 UT) CAPS Take 2,000 Units by mouth daily.     clonazePAM (KLONOPIN) 0.5 MG tablet Take 1 tablet (0.5 mg total) by mouth at bedtime as needed for anxiety. 60 tablet 0   COMIRNATY syringe      cyanocobalamin 1000 MCG tablet Take 1,000 mcg by mouth daily.     dexamethasone (DECADRON) 4 MG tablet Take 2 tabs at the night before and 2 tab the morning of chemotherapy, every 3 weeks, by mouth x 6 cycles 24 tablet 6   DULoxetine (CYMBALTA) 60 MG capsule TAKE 1 CAPSULE BY MOUTH EVERY DAY (Patient taking differently: Take 60 mg by mouth daily.) 90 capsule 3   fentaNYL (DURAGESIC) 50 MCG/HR Place 1 patch onto the skin every 3 (three) days. 10 patch 0   FLUZONE HIGH-DOSE 0.5 ML injection      gabapentin (NEURONTIN) 600 MG tablet Take 1 tablet (600 mg total) by mouth 4 (four) times daily. 360 tablet 4   irbesartan  (AVAPRO) 150 MG tablet Take 150 mg by mouth daily.     iron polysaccharides (NIFEREX) 150 MG capsule Take 1 capsule (150 mg total) by mouth daily. 30 capsule 2   lactulose (CHRONULAC) 10 GM/15ML solution TAKE 15 MLS (10 G TOTAL) BY MOUTH 3 (THREE) TIMES DAILY. 473 mL 1   lidocaine-prilocaine (EMLA) cream APPLY TO AFFECTED AREA ONCE AS DIRECTED (Patient taking differently: Apply 1 Application topically See admin instructions. Apply to port before Chemo) 30 g 3   magnesium oxide (MAG-OX) 400 (240 Mg) MG tablet TAKE 1 TABLET BY MOUTH EVERY DAY (Patient taking differently:  Take 400 mg by mouth daily.) 30 tablet 1   Melatonin 10 MG TABS Take 10 mg by mouth at bedtime as needed (sleep).     methimazole (TAPAZOLE) 5 MG tablet Take 5 mg by mouth See admin instructions. 3 times a week only     ondansetron (ZOFRAN) 8 MG tablet Take 1 tablet (8 mg total) by mouth every 8 (eight) hours as needed for nausea or vomiting. Start on the third day after carboplatin. 30 tablet 1   oxyCODONE-acetaminophen (PERCOCET) 10-325 MG tablet Take 1 tablet by mouth every 6 (six) hours as needed for pain. 75 tablet 0   pantoprazole (PROTONIX) 40 MG tablet Take 1 tablet (40 mg total) by mouth daily for 14 days. 14 tablet 0   polyethylene glycol (MIRALAX / GLYCOLAX) 17 g packet Take 17 g by mouth 2 (two) times daily.     pregabalin (LYRICA) 50 MG capsule Take 1 capsule (50 mg total) by mouth 3 (three) times daily. 90 capsule 3   prochlorperazine (COMPAZINE) 10 MG tablet TAKE 1 TABLET BY MOUTH EVERY 6 HOURS AS NEEDED FOR NAUSEA OR VOMITING. (Patient taking differently: Take 10 mg by mouth every 6 (six) hours as needed for nausea.) 30 tablet 1   rosuvastatin (CRESTOR) 40 MG tablet Take 1 tablet (40 mg total) by mouth at bedtime. 90 tablet 3   senna (SENOKOT) 8.6 MG tablet Take 2 tablets by mouth 2 (two) times daily.     traZODone (DESYREL) 100 MG tablet TAKE 1 TABLET BY MOUTH EVERYDAY AT BEDTIME (Patient taking differently: Take 100 mg  by mouth at bedtime.) 90 tablet 2     Physical Exam: Blood pressure 119/67, pulse 84, temperature 97.9 F (36.6 C), temperature source Oral, resp. rate 14, height 5\' 3"  (1.6 m), weight 69.8 kg, SpO2 94%. General: Pleasant white female laying on hospital bed, appears stated age, NAD. HEENT: head -normocephalic, atraumatic; Eyes: PERRL, anicteric sclerae  Neck- Trachea is midline, no thyromegaly or JVD appreciated.  CV- RRR, normal S1/S2, no M/R/G, no lower extremity edema  Pulm- breathing is non-labored. CTABL, no wheezes, rhales, rhonchi. Abd- soft, nondistended, there is lower abdominal fullness, mild global tenderness, worse in the epigastric region.  GU- deferred  MSK- UE/LE symmetrical, no cyanosis, clubbing, or edema. Neuro- nonfocal exam Psych- Alert and Oriented x3 with appropriate affect Skin: warm and dry, no rashes or lesions   Results for orders placed or performed during the hospital encounter of 01/11/23 (from the past 48 hour(s))  CBC with Differential     Status: Abnormal   Collection Time: 01/11/23  2:25 PM  Result Value Ref Range   WBC 13.8 (H) 4.0 - 10.5 K/uL   RBC 2.95 (L) 3.87 - 5.11 MIL/uL   Hemoglobin 8.9 (L) 12.0 - 15.0 g/dL   HCT 16.1 (L) 09.6 - 04.5 %   MCV 95.6 80.0 - 100.0 fL   MCH 30.2 26.0 - 34.0 pg   MCHC 31.6 30.0 - 36.0 g/dL   RDW 40.9 81.1 - 91.4 %   Platelets 247 150 - 400 K/uL   nRBC 0.0 0.0 - 0.2 %   Neutrophils Relative % 84 %   Neutro Abs 11.7 (H) 1.7 - 7.7 K/uL   Lymphocytes Relative 8 %   Lymphs Abs 1.1 0.7 - 4.0 K/uL   Monocytes Relative 7 %   Monocytes Absolute 1.0 0.1 - 1.0 K/uL   Eosinophils Relative 0 %   Eosinophils Absolute 0.0 0.0 - 0.5 K/uL   Basophils  Relative 0 %   Basophils Absolute 0.0 0.0 - 0.1 K/uL   Immature Granulocytes 1 %   Abs Immature Granulocytes 0.08 (H) 0.00 - 0.07 K/uL    Comment: Performed at [facility], [address] [place], [place] [address]  Comprehensive metabolic panel      Status: Abnormal   Collection Time: 01/11/23  2:25 PM  Result Value Ref Range   Sodium 137 135 - 145 mmol/L   Potassium 2.8 (L) 3.5 - 5.1 mmol/L   Chloride 97 (L) 98 - 111 mmol/L   CO2 24 22 - 32 mmol/L   Glucose, Bld 109 (H) 70 - 99 mg/dL    Comment: Glucose reference range applies only to samples taken after fasting for at least 8 hours.   BUN 19 8 - 23 mg/dL   Creatinine, Ser 6.44 0.44 - 1.00 mg/dL   Calcium 9.5 8.9 - 03.4 mg/dL   Total Protein 6.2 (L) 6.5 - 8.1 g/dL   Albumin 2.5 (L) 3.5 - 5.0 g/dL   AST 26 15 - 41 U/L   ALT 20 0 - 44 U/L   Alkaline Phosphatase 177 (H) 38 - 126 U/L   Total Bilirubin 0.8 <1.2 mg/dL   GFR, Estimated >74 >25 mL/min    Comment: (NOTE) Calculated using the CKD-EPI Creatinine Equation (2021)    Anion gap 16 (H) 5 - 15    Comment: Performed at [facility], [address] [place], [place] [address]  Magnesium     Status: Abnormal   Collection Time: 01/11/23  2:25 PM  Result Value Ref Range   Magnesium 1.5 (L) 1.7 - 2.4 mg/dL    Comment: Performed at [facility], [address], [place], [place] [address]  I-Stat CG4 Lactic Acid, ED     Status: None   Collection Time: 01/11/23  3:03 PM  Result Value Ref Range   Lactic Acid, Venous 1.4 0.5 - 1.9 mmol/L  Urinalysis, w/ Reflex to Culture (Infection Suspected) -Urine, Clean Catch     Status: Abnormal   Collection Time: 01/11/23  4:48 PM  Result Value Ref Range   Specimen Source URINE, CLEAN CATCH    Color, Urine YELLOW YELLOW   APPearance CLEAR CLEAR   Specific Gravity, Urine 1.010 1.005 - 1.030   pH 6.0 5.0 - 8.0   Glucose, UA NEGATIVE NEGATIVE mg/dL   Hgb urine dipstick NEGATIVE NEGATIVE   Bilirubin Urine NEGATIVE NEGATIVE   Ketones, ur 80 (A) NEGATIVE mg/dL   Protein, ur NEGATIVE NEGATIVE mg/dL   Nitrite NEGATIVE NEGATIVE   Leukocytes,Ua NEGATIVE NEGATIVE   RBC / HPF 0-5 0 - 5 RBC/hpf   WBC, UA 0-5 0 - 5 WBC/hpf    Comment:        Reflex urine  culture not performed if WBC <=10, OR if Squamous epithelial cells >5. If Squamous epithelial cells >5 suggest recollection.    Bacteria, UA RARE (A) NONE SEEN   Squamous Epithelial / HPF 0-5 0 - 5 /HPF    Comment: Performed at [facility], [address], [place], [place] [address]  Lipase, blood     Status: None   Collection Time: 01/12/23 11:28 AM  Result Value Ref Range   Lipase 45 11 - 51 U/L    Comment: Performed at [facility], [address], [place], [place] [address]  CBC     Status: Abnormal   Collection Time: 01/12/23 11:28 AM  Result  Value Ref Range   WBC 10.5 4.0 - 10.5 K/uL   RBC 2.99 (L) 3.87 - 5.11 MIL/uL   Hemoglobin 9.1 (L) 12.0 - 15.0 g/dL   HCT 40.3 (L) 47.4 - 25.9 %   MCV 99.7 80.0 - 100.0 fL   MCH 30.4 26.0 - 34.0 pg   MCHC 30.5 30.0 - 36.0 g/dL   RDW 56.3 (H) 87.5 - 64.3 %   Platelets 185 150 - 400 K/uL   nRBC 0.0 0.0 - 0.2 %    Comment: Performed at [facility], [address] [place], [place] [address]  Comprehensive metabolic panel     Status: Abnormal   Collection Time: 01/12/23 11:28 AM  Result Value Ref Range   Sodium 133 (L) 135 - 145 mmol/L   Potassium 4.0 3.5 - 5.1 mmol/L   Chloride 97 (L) 98 - 111 mmol/L   CO2 21 (L) 22 - 32 mmol/L   Glucose, Bld 88 70 - 99 mg/dL    Comment: Glucose reference range applies only to samples taken after fasting for at least 8 hours.   BUN 17 8 - 23 mg/dL   Creatinine, Ser 8.84 (L) 0.44 - 1.00 mg/dL   Calcium 8.9 8.9 - 16.6 mg/dL   Total Protein 5.6 (L) 6.5 - 8.1 g/dL   Albumin 2.3 (L) 3.5 - 5.0 g/dL   AST 20 15 - 41 U/L   ALT 17 0 - 44 U/L   Alkaline Phosphatase 150 (H) 38 - 126 U/L   Total Bilirubin 1.1 <1.2 mg/dL   GFR, Estimated >06 >30 mL/min    Comment: (NOTE) Calculated using the CKD-EPI Creatinine Equation (2021)    Anion gap 15 5 - 15    Comment: Performed at [facility], [address] [place], [place] [address]   Magnesium     Status: None   Collection Time: 01/12/23 11:28 AM  Result Value Ref Range   Magnesium 1.8 1.7 - 2.4 mg/dL    Comment: Performed at [facility], [address] [place], [place] [address]  Phosphorus     Status: Abnormal   Collection Time: 01/12/23 11:28 AM  Result Value Ref Range   Phosphorus 1.9 (L) 2.5 - 4.6 mg/dL    Comment: Performed at [facility], [address], [place], [place] [address]   *Note: Due to a large number of results and/or encounters for the requested time period, some results have not been displayed. A complete set of results can be found in Results Review.   CT ABDOMEN PELVIS W CONTRAST  Result Date: 01/11/2023 CLINICAL DATA:  Nausea and vomiting.  History of ovarian cancer. EXAM: CT ABDOMEN AND PELVIS WITH CONTRAST TECHNIQUE: Multidetector CT imaging of the abdomen and pelvis was performed using the standard protocol following bolus administration of intravenous contrast. RADIATION DOSE REDUCTION: This exam was performed according to the departmental dose-optimization program which includes automated exposure control, adjustment of the mA and/or kV according to patient size and/or use of iterative reconstruction technique. CONTRAST:  OMNIPAQUE IOHEXOL 300 MG/ML  SOLN COMPARISON:  December 19, 2022 FINDINGS: Lower chest: No acute abnormality. Hepatobiliary: New subcapsular fluid collection in the right liver measures water density, and measures 11.9 x 5.9 x 15 cm. Otherwise normal attenuation of the liver. The gallbladder is normally distended. Pancreas: Unremarkable. No pancreatic ductal dilatation or surrounding inflammatory changes. Spleen: Normal in size without focal abnormality. Adrenals/Urinary Tract: Adrenal glands are unremarkable. Kidneys are normal, without  renal calculi, focal lesion, or hydronephrosis. Bladder is unremarkable. Stomach/Bowel: Sub pathologic gas and fluid distension of small bowels in the  mid abdomen, which measure up to 2.8 cm. Preserved colonic pattern. Vascular/Lymphatic: Aortic atherosclerosis. No enlarged abdominal or pelvic lymph nodes. Reproductive: Right adnexal cystic and solid mass measures 3.8 x 4.2 x 2.7 cm., slightly enlarged. Loculated tubular fluid collection is seen extending from the mass to the central pelvis. Post hysterectomy. Other: No abdominal wall hernia. Musculoskeletal: Sclerotic metastatic disease in the thoracolumbar spine is mostly stable. No new compression fractures. Chronic moderate L1 compression deformity. IMPRESSION: 1. New subcapsular fluid collection in the right liver measures water density, and measures 11.9 x 5.9 x 15 cm. 2. Sub pathologic gas and fluid distension of small bowels in the mid abdomen, with cross-section measuring up to 2.8 cm. Findings are concerning for early or partial small bowel obstruction. 3. Right adnexal cystic and solid mass measures 3.8 x 4.2 x 2.7 cm, slightly enlarged. Loculated tubular fluid collection is seen extending from the mass to the central pelvis, also slightly enlarged. 4. Sclerotic metastatic disease in the thoracolumbar spine is mostly stable. 5. Chronic moderate L1 compression deformity. 6. Aortic atherosclerosis. Aortic Atherosclerosis (ICD10-I70.0). Electronically Signed   By: Ted Mcalpine M.D.   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 She is currently hemodynamically stable. All labs and imaging reviewed by me. CT abdomen pelvis significant for new subcapsular fluid collection of the right liver along with air fluid levels of the small bowel concerning for possible early SBO.    Regarding the subcapsular collection, I suspect it may be hematoma from her  recent fall. Her hgb is stable and she is not having RUQ/flank pain at this time. I do not recommend any intervention at present, just monitoring of CBC and abdominal exam. The fluid does not appear to contain gas or any signs of infection. No role for surgery or IR consult at this time.   No emergent role for surgery due to possible SBO. Her stomach is decompressed on CT and she has mild air fluid levels of the small bowel. Hold off on NG tube and continue to monitor. Recommend bowel rest and supportive care with fluids, antiemetics, and anagesics as needed. Agree with suppository for constipation.   CCS will follow for now.   FEN - NPO, sips/chips ok  VTE - SCD's ID - none currently Admit - [facility] service   I reviewed nursing notes, Consultant GYN oncology notes, hospitalist notes, last 24 h vitals and pain scores, last 48 h intake and output, last 24 h labs and trends, and last 24 h imaging results.  Adam Phenix, PA-C [facility] 01/12/2023, 3:06 PM Please see Amion for pager number during day hours 7:00am-4:30pm or 7:00am -11:30am on weekends

## 2023-01-12 NOTE — Consult Note (Signed)
Gynecologic Oncology Consultation  Alexandria Mclaughlin 82 y.o. female  HPI: Alexandria Mclaughlin is an 82 year old female who presented to the ER via ambulance on 01/11/2023 with nausea, emesis, decreased appetite, diffuse abdominal pain. Of note, she was recently diagnosed with UTI and was changed from one antibiotic to bactrim. In the ER, her WBC was 13.8, Hgb 8.9, Hct 28.2, K+ 2.8, albumin 2.5, alk phosphatase at 177, mag 1.5. A CT AP was performed on 01/11/23 resulting:  1. New subcapsular fluid collection in the right liver measures water density, and measures 11.9 x 5.9 x 15 cm. 2. Sub pathologic gas and fluid distension of small bowels in the mid abdomen, with Alexandria Mclaughlin measuring up to 2.8 cm. Findings are concerning for early or partial small bowel obstruction. 3. Right adnexal cystic and solid mass measures 3.8 x 4.2 x 2.7 cm, slightly enlarged. Loculated tubular fluid collection is seen extending from the mass to the central pelvis, also slightly enlarged. 4. Sclerotic metastatic disease in the thoracolumbar spine is mostly stable. 5. Chronic moderate L1 compression deformity. 6. Aortic atherosclerosis.  Of note, she was hospitalized from 10/19/2022 to 12/27/2022 for intractable abdominal pain related to her advancing metastatic ovarian cancer with peritoneal carcinomatosis, encephalopathy felt to be related to medication use. She did undergo a superior hypogastric plexus block with IR with improvement in pain after the procedure. Her last cycle of chemotherapy was on 11/27/2022 under the care of Dr. Bertis Mclaughlin.  Her past medical history includes metastatic ovarian cancer with peritoneal carcinomatosis, mets to lumbar and thoracic spine, DDD, CAD, HTN, GERD, HLD, prior spinal fusion, anxiety and depression and CVA, and insomnia, chronic pain.   Interval History: Belly pain "is there" and is like a band across the mid abdomen. Vomiting starting night before last. States vomiting was not related with an increase  in pain. Last BM was on Sunday am. No flatus today, last on Sunday. Nausea intermittent with medication helping some. In regards to treatment, wasn't sure about additional chemo, wanted to think about it. Feels steady when she gets up to bedside commode. Has not ambulated in the halls. No lower extremity edema or pain reported. She has been belching frequently and has had reflux. Voiding without difficulty without hematuria or dysuria. No fever, chills.   Review of Systems: see interval.  Current Meds: Current inpatient and outpatient meds reviewed.  Allergy: No Known Allergies  Social Hx:   Social History   Socioeconomic History   Marital status: Widowed    Spouse name: Not on file   Number of children: Not on file   Years of education: 14   Highest education level: Associate degree: academic program  Occupational History   Occupation: Retired  Tobacco Use   Smoking status: Former    Current packs/day: 0.00    Average packs/day: 0.3 packs/day for 15.0 years (3.8 ttl pk-yrs)    Types: Cigarettes    Start date: 03/15/1953    Quit date: 03/15/1968    Years since quitting: 54.8   Smokeless tobacco: Never  Vaping Use   Vaping status: Never Used  Substance and Sexual Activity   Alcohol use: Not Currently    Comment: occasional glass of wine   Drug use: Yes    Frequency: 20.0 times per week    Types: Fentanyl, Hydrocodone   Sexual activity: Not Currently  Other Topics Concern   Not on file  Social History Narrative   Right handed   Drinks caffeine   Condo two  story with Engineer, structural   Social Determinants of Health   Financial Resource Strain: Not on file  Food Insecurity: No Food Insecurity (12/30/2022)   Hunger Vital Sign    Worried About Running Out of Food in the Last Year: Never true    Ran Out of Food in the Last Year: Never true  Transportation Needs: No Transportation Needs (12/30/2022)   PRAPARE - Administrator, Civil Service (Medical): No    Lack of  Transportation (Non-Medical): No  Physical Activity: Not on file  Stress: Stress Concern Present (12/30/2022)   [employer] of Occupational Health - Occupational Stress Questionnaire    Feeling of Stress : To some extent  Social Connections: Unknown (07/04/2021)   Received from [facility]   Social Network    Social Network: Not on file  Intimate Partner Violence: Not At Risk (12/21/2022)   Humiliation, Afraid, Rape, and Kick questionnaire    Fear of Current or Ex-Partner: No    Emotionally Abused: No    Physically Abused: No    Sexually Abused: No    Past Surgical Hx:  Past Surgical History:  Procedure Laterality Date   ABDOMINAL HYSTERECTOMY     Due to fibroids   BACK SURGERY  2011   lumbar Fusion   BLADDER SURGERY  1985   CESAREAN SECTION     x2   EYE SURGERY Bilateral    Cataract surgery with IOL   FOOT SURGERY Right    "just scraped the bone"   IR BLOCK INJ HYPOGASTRIC PLEXUS  12/23/2022   IR IMAGING GUIDED PORT INSERTION  05/26/2022   LAPAROSCOPY N/A 04/08/2022   Procedure: LAPAROSCOPY DIAGNOSTIC WITH BIOPSIES;  Surgeon: Clide Cliff, MD;  Location: [facility];  Service: Gynecology;  Laterality: N/A;   RETINAL DETACHMENT SURGERY Right 10/2011   TRANSFORAMINAL LUMBAR INTERBODY FUSION W/ MIS 1 LEVEL Right 04/26/2018   Procedure: Right Lumbar four-five Minimally invasive Transforaminal lumbar interbody fusion;  Surgeon: Jadene Pierini, MD;  Location: [facility] OR;  Service: Neurosurgery;  Laterality: Right;    Past Medical Hx:  Past Medical History:  Diagnosis Date   Abnormality of gait 02/07/2015   Acquired hallux rigidus of left foot 12/17/2020   Acquired unequal leg length on left 08/08/2011   AMS (altered mental status) 12/28/2020   Biceps tendonitis on left 08/18/2018   BPPV (benign paroxysmal positional vertigo) 02/07/2015   Bunion 12/17/2020   Cancer (HCC) 04/25/2022   Cerebrovascular disease    Cervical facet syndrome    Coronary artery  disease 04/27/2022   Disorders of sacrum    Enthesopathy of hip region    Essential hypertension 02/07/2015   Generalized anxiety disorder    GERD (gastroesophageal reflux disease)    Headache    History of kidney stones    HLD (hyperlipidemia) 02/07/2015   Low back pain 02/07/2015   Lumbar facet arthropathy 07/05/2014   Lumbar post-laminectomy syndrome 06/11/2011   Lumbar radicular pain 07/29/2016   Lumbar radiculopathy 04/26/2018   Major depressive disorder    Mild cognitive impairment of uncertain or unknown etiology 01/24/2022   Multiple lacunar infarcts    MRI - bilateral basal ganglia and left thalamus   Myoclonus 09/19/2015   Rotator cuff tendonitis, left 06/08/2018   Sacroiliac joint dysfunction 06/11/2011   Sciatic neuritis    Stroke (HCC)    hx TIA   Subcortical infarction    2016 MRI - small chronic subcortical infarct with associated chronic hemosiderin deposition within the  right superior frontal gyrus.   Synovitis and tenosynovitis    Therapeutic opioid induced constipation 07/25/2015   Thyroid disease    Transient left leg weakness    Vision abnormalities     Family Hx:  Family History  Problem Relation Age of Onset   Diabetes Mother    Heart disease Mother    Stroke Mother    Heart disease Father    Heart disease Brother    Colon cancer Neg Hx    Breast cancer Neg Hx    Ovarian cancer Neg Hx    Endometrial cancer Neg Hx    Pancreatic cancer Neg Hx    Prostate cancer Neg Hx     Vitals:  Blood pressure 127/64, pulse 92, temperature 97.7 F (36.5 C), temperature source Oral, resp. rate 14, height 5\' 3"  (1.6 m), weight 153 lb 15.5 oz (69.8 kg), SpO2 100%.  Physical Exam:  Alert, oriented, resting in bed comfortably, in no acute distress Breathing unlabored Abdomen is soft, non-distended, active bowel sounds, tender more in left lower quadrant.  No lower extrem edema bilaterally  Assessment/Plan:  Alexandria Mclaughlin is a 82 y.o. woman with progressive  high grade serous ovarian cancer, s/p 7C of neoadjuvant carb/tax, 2 cycles of doxil currently admitted with new subcapsular fluid collection in the right liver, findings concerning for early or partial small bowel obstruction. Await input from Gen Surg about subcapsular collection. Options in regards to her ovarian cancer discussed by Dr. Alvester Morin including continuing chemotherapy if medically able or no treatment with focusing on symptoms. Patient would like to talk with Dr. Bertis Mclaughlin if possible about treatment in the future. Patient reports bowel function up until yesterday so does not appear to be a complete obstruction at this time. Continue conservative management in regards to this. Large stool burden noted on imaging with recommendation for suppositories or enemas as needed. Continue plan of care per Hospitalist team.  Doylene Bode, NP 01/12/2023, 12:13 PM

## 2023-01-13 ENCOUNTER — Inpatient Hospital Stay (HOSPITAL_COMMUNITY): Payer: Medicare HMO

## 2023-01-13 DIAGNOSIS — R188 Other ascites: Secondary | ICD-10-CM

## 2023-01-13 DIAGNOSIS — R4589 Other symptoms and signs involving emotional state: Secondary | ICD-10-CM

## 2023-01-13 DIAGNOSIS — Z515 Encounter for palliative care: Secondary | ICD-10-CM

## 2023-01-13 DIAGNOSIS — G893 Neoplasm related pain (acute) (chronic): Secondary | ICD-10-CM | POA: Diagnosis not present

## 2023-01-13 DIAGNOSIS — K56600 Partial intestinal obstruction, unspecified as to cause: Secondary | ICD-10-CM | POA: Diagnosis not present

## 2023-01-13 LAB — BASIC METABOLIC PANEL
Anion gap: 11 (ref 5–15)
BUN: 15 mg/dL (ref 8–23)
CO2: 25 mmol/L (ref 22–32)
Calcium: 8.7 mg/dL — ABNORMAL LOW (ref 8.9–10.3)
Chloride: 97 mmol/L — ABNORMAL LOW (ref 98–111)
Creatinine, Ser: 0.43 mg/dL — ABNORMAL LOW (ref 0.44–1.00)
GFR, Estimated: >60 mL/min (ref >60)
Glucose, Bld: 73 mg/dL (ref 70–99)
Potassium: 3.5 mmol/L (ref 3.5–5.1)
Sodium: 133 mmol/L — ABNORMAL LOW (ref 135–145)

## 2023-01-13 LAB — CBC
HCT: 27.9 % — ABNORMAL LOW (ref 36.0–46.0)
Hemoglobin: 8.7 g/dL — ABNORMAL LOW (ref 12.0–15.0)
MCH: 30.5 pg (ref 26.0–34.0)
MCHC: 31.2 g/dL (ref 30.0–36.0)
MCV: 97.9 fL (ref 80.0–100.0)
Platelets: 167 10*3/uL (ref 150–400)
RBC: 2.85 MIL/uL — ABNORMAL LOW (ref 3.87–5.11)
RDW: 15.5 % (ref 11.5–15.5)
WBC: 8.6 10*3/uL (ref 4.0–10.5)
nRBC: 0 % (ref 0.0–0.2)

## 2023-01-13 LAB — MAGNESIUM: Magnesium: 1.6 mg/dL — ABNORMAL LOW (ref 1.7–2.4)

## 2023-01-13 MED ORDER — GABAPENTIN 100 MG PO CAPS
300.0000 mg | ORAL_CAPSULE | Freq: Four times a day (QID) | ORAL | Status: DC
  Administered 2023-01-13 – 2023-01-15 (×7): 300 mg via ORAL
  Filled 2023-01-13 (×10): qty 3

## 2023-01-13 MED ORDER — POLYETHYLENE GLYCOL 3350 17 G PO PACK
17.0000 g | PACK | Freq: Every day | ORAL | Status: DC
  Administered 2023-01-13: 17 g via ORAL
  Filled 2023-01-13: qty 1

## 2023-01-13 MED ORDER — CALCIUM CARBONATE ANTACID 500 MG PO CHEW
400.0000 mg | CHEWABLE_TABLET | Freq: Once | ORAL | Status: AC
  Administered 2023-01-13: 400 mg via ORAL
  Filled 2023-01-13: qty 2

## 2023-01-13 MED ORDER — DICYCLOMINE HCL 10 MG PO CAPS
10.0000 mg | ORAL_CAPSULE | Freq: Three times a day (TID) | ORAL | Status: DC
  Administered 2023-01-13: 10 mg via ORAL
  Filled 2023-01-13: qty 1

## 2023-01-13 MED ORDER — HYDROMORPHONE HCL 1 MG/ML IJ SOLN: 0.5000 mg | INTRAMUSCULAR | Status: DC | PRN

## 2023-01-13 MED ORDER — MAGNESIUM SULFATE 2 GM/50ML IV SOLN
2.0000 g | Freq: Once | INTRAVENOUS | Status: AC
  Administered 2023-01-13: 2 g via INTRAVENOUS
  Filled 2023-01-13: qty 50

## 2023-01-13 MED ORDER — BISACODYL 10 MG RE SUPP: 10.0000 mg | Freq: Every day | RECTAL | Status: DC | PRN

## 2023-01-13 MED ORDER — POTASSIUM CHLORIDE 10 MEQ/100ML IV SOLN
10.0000 meq | INTRAVENOUS | Status: AC
  Administered 2023-01-13 (×4): 10 meq via INTRAVENOUS
  Filled 2023-01-13 (×4): qty 100

## 2023-01-13 MED ORDER — LACTATED RINGERS IV SOLN: INTRAVENOUS | Status: DC

## 2023-01-13 MED ORDER — ALUM & MAG HYDROXIDE-SIMETH 200-200-20 MG/5ML PO SUSP
30.0000 mL | ORAL | Status: DC | PRN
  Administered 2023-01-13 – 2023-01-15 (×3): 30 mL via ORAL
  Filled 2023-01-13 (×2): qty 30

## 2023-01-13 MED ORDER — BISACODYL 10 MG RE SUPP
10.0000 mg | Freq: Once | RECTAL | Status: AC
  Administered 2023-01-13: 10 mg via RECTAL
  Filled 2023-01-13: qty 1

## 2023-01-13 MED ORDER — FLEET ENEMA RE ENEM
1.0000 | ENEMA | Freq: Once | RECTAL | Status: AC
  Administered 2023-01-13: 1 via RECTAL
  Filled 2023-01-13: qty 1

## 2023-01-13 MED ORDER — PREGABALIN 75 MG PO CAPS
100.0000 mg | ORAL_CAPSULE | Freq: Three times a day (TID) | ORAL | Status: DC
  Administered 2023-01-13 – 2023-01-15 (×8): 100 mg via ORAL
  Filled 2023-01-13 (×8): qty 1

## 2023-01-13 NOTE — Progress Notes (Signed)
Mobility Specialist - Progress Note   01/13/23 0919  Mobility  Activity Ambulated with assistance in hallway  Level of Assistance Minimal assist, patient does 75% or more  Assistive Device Front wheel walker  Distance Ambulated (ft) 100 ft  Range of Motion/Exercises Active Assistive  Activity Response Tolerated well  Mobility Referral Yes  $Mobility charge 1 Mobility  Mobility Specialist Start Time (ACUTE ONLY) [id number]  Mobility Specialist Stop Time (ACUTE ONLY) 0919  Mobility Specialist Time Calculation (min) (ACUTE ONLY) 14 min   Pt was found in bed and agreeable to ambulate. Was min-A for bed mobility and SB for ambulation. Stated feeling tired during session. Returned to bed with all needs met. Call bell in reach and LPN in room.  Billey Chang Mobility Specialist

## 2023-01-13 NOTE — Plan of Care (Signed)

## 2023-01-13 NOTE — Progress Notes (Signed)
Alexandria Mclaughlin   DOB:[birth year]   VH#:[id number]    ASSESSMENT & PLAN:  Refractory ovarian cancer Poor nutritional intake Recent fall Urinary tract infection The patient did not tolerate chemotherapy well She had recurrent ER visits and hospitalization in the past 2 months CT imaging is worse which is not unexpected as her treatment was interrupted Performance status is really poor At current state, I am reluctant to recommend any chemotherapy for her The patient has very poor understanding about the natural history of her current disease I recommend consideration for palliative care only but the patient does not comprehend much of the nature of disease Continue supportive care only at this point  Severe multifactorial chronic pain The patient have severe chronic pain at baseline, worse recently and had significant intervention recently Her chronic pain, poor mobility and recent escalation of pain medicine likely contributed to some worsening constipation She will continue current prescribed pain regimen that is currently directed by palliative care team  Severe constipation versus partial small bowel obstruction At baseline, she is constipated with bowel movement every 3 days or so She will continue aggressive regimen She is not a candidate for surgical intervention  Goals of care discussion The patient have very poor understanding of her disease process Her current ECOG performance status is 3 I truly do not recommend chemotherapy for her and I do not see possibility of her getting stronger or better in the near future I recommend continue goals of care discussion with palliative care team and consideration to transition her care to comfort measures I will continue to follow  The total time spent in the appointment was 55 minutes encounter with patients including review of chart and various tests results, discussions about plan of care and coordination of care plan  Artis Delay,  MD 01/13/2023 7:59 AM  Subjective:  Patient is well-known to me.  She was supposed to resume chemotherapy last week.  She had a fall and was brought to the emergency room.  Subsequently, prior to discharge, she was also found to have urinary tract infection.  She is very weak.  Her oral intake is poor.  She is chronically constipated.  Her pain is controlled this morning.  We have called her several times last week about her decision whether she wants to resume chemotherapy.  Last week, she was undecided.  With current admission to the hospital, the patient jumped to decision that she wants more chemotherapy  Objective:  Vitals:   01/12/23 2035 01/13/23 0535  BP: 122/73 (!) 148/80  Pulse: 88 100  Resp: 18 18  Temp: 97.9 F (36.6 C) 97.8 F (36.6 C)  SpO2: 96% 96%     Intake/Output Summary (Last 24 hours) at 01/13/2023 0759 Last data filed at 01/13/2023 0600 Gross per 24 hour  Intake 2138.62 ml  Output 400 ml  Net 1738.62 ml    GENERAL:alert, no distress and comfortable.  Somewhat somnolent this morning SKIN: skin color, texture, turgor are normal, no rashes or significant lesions EYES: normal, Conjunctiva are pink and non-injected, sclera clear OROPHARYNX:no exudate, no erythema and lips, buccal mucosa, and tongue normal  NECK: supple, thyroid normal size, non-tender, without nodularity LYMPH:  no palpable lymphadenopathy in the cervical, axillary or inguinal LUNGS: clear to auscultation and percussion with normal breathing effort HEART: regular rate & rhythm and no murmurs and no lower extremity edema ABDOMEN:abdomen soft, active bowel sounds Musculoskeletal:no cyanosis of digits and no clubbing  NEURO: alert & oriented x 3 with fluent  speech, no focal motor/sensory deficits   Labs:  Recent Labs    01/06/23 1335 01/11/23 1425 01/12/23 1128 01/13/23 0206  NA 138 137 133* 133*  K 3.7 2.8* 4.0 3.5  CL 99 97* 97* 97*  CO2 29 24 21* 25  GLUCOSE 117* 109* 88 73  BUN 25*  19 17 15   CREATININE 0.45 0.53 0.43* 0.43*  CALCIUM 9.1 9.5 8.9 8.7*  GFRNONAA >60 >60 >60 >60  PROT 6.4* 6.2* 5.6*  --   ALBUMIN 2.6* 2.5* 2.3*  --   AST 22 26 20   --   ALT 15 20 17   --   ALKPHOS 126 177* 150*  --   BILITOT 0.4 0.8 1.1  --     Studies: I personally reviewed her CT imaging CT ABDOMEN PELVIS W CONTRAST  Result Date: 01/11/2023 CLINICAL DATA:  Nausea and vomiting.  History of ovarian cancer. EXAM: CT ABDOMEN AND PELVIS WITH CONTRAST TECHNIQUE: Multidetector CT imaging of the abdomen and pelvis was performed using the standard protocol following bolus administration of intravenous contrast. RADIATION DOSE REDUCTION: This exam was performed according to the departmental dose-optimization program which includes automated exposure control, adjustment of the mA and/or kV according to patient size and/or use of iterative reconstruction technique. CONTRAST:  OMNIPAQUE IOHEXOL 300 MG/ML  SOLN COMPARISON:  December 19, 2022 FINDINGS: Lower chest: No acute abnormality. Hepatobiliary: New subcapsular fluid collection in the right liver measures water density, and measures 11.9 x 5.9 x 15 cm. Otherwise normal attenuation of the liver. The gallbladder is normally distended. Pancreas: Unremarkable. No pancreatic ductal dilatation or surrounding inflammatory changes. Spleen: Normal in size without focal abnormality. Adrenals/Urinary Tract: Adrenal glands are unremarkable. Kidneys are normal, without renal calculi, focal lesion, or hydronephrosis. Bladder is unremarkable. Stomach/Bowel: Sub pathologic gas and fluid distension of small bowels in the mid abdomen, which measure up to 2.8 cm. Preserved colonic pattern. Vascular/Lymphatic: Aortic atherosclerosis. No enlarged abdominal or pelvic lymph nodes. Reproductive: Right adnexal cystic and solid mass measures 3.8 x 4.2 x 2.7 cm., slightly enlarged. Loculated tubular fluid collection is seen extending from the mass to the central pelvis. Post  hysterectomy. Other: No abdominal wall hernia. Musculoskeletal: Sclerotic metastatic disease in the thoracolumbar spine is mostly stable. No new compression fractures. Chronic moderate L1 compression deformity. IMPRESSION: 1. New subcapsular fluid collection in the right liver measures water density, and measures 11.9 x 5.9 x 15 cm. 2. Sub pathologic gas and fluid distension of small bowels in the mid abdomen, with cross-section measuring up to 2.8 cm. Findings are concerning for early or partial small bowel obstruction. 3. Right adnexal cystic and solid mass measures 3.8 x 4.2 x 2.7 cm, slightly enlarged. Loculated tubular fluid collection is seen extending from the mass to the central pelvis, also slightly enlarged. 4. Sclerotic metastatic disease in the thoracolumbar spine is mostly stable. 5. Chronic moderate L1 compression deformity. 6. Aortic atherosclerosis. Aortic Atherosclerosis (ICD10-I70.0). Electronically Signed   By: Ted Mcalpine M.D.   On: 01/11/2023 16:03   CT Head Wo Contrast  Result Date: 01/06/2023 CLINICAL DATA:  Head and neck trauma, pain EXAM: CT HEAD WITHOUT CONTRAST CT CERVICAL SPINE WITHOUT CONTRAST TECHNIQUE: Multidetector CT imaging of the head and cervical spine was performed following the standard protocol without intravenous contrast. Multiplanar CT image reconstructions of the cervical spine were also generated. RADIATION DOSE REDUCTION: This exam was performed according to the departmental dose-optimization program which includes automated exposure control, adjustment of the mA and/or kV according  to patient size and/or use of iterative reconstruction technique. COMPARISON:  12/27/2020 CT head; no prior CT cervical spine, correlation is made with 12/2020 CTA head and neck FINDINGS: CT HEAD FINDINGS Brain: No evidence of acute infarct, hemorrhage, mass, mass effect, or midline shift. No hydrocephalus or extra-axial fluid collection. Periventricular white matter changes,  likely the sequela of chronic small vessel ischemic disease. Vascular: No hyperdense vessel. Skull: Negative for fracture or focal lesion. Sinuses/Orbits: Mucous retention cyst in the left ethmoid air cells. Otherwise clear paranasal sinuses. Status post bilateral lens replacements. Other: The mastoid air cells are well aerated. CT CERVICAL SPINE FINDINGS Alignment: No traumatic listhesis. Preservation of the normal cervical lordosis. Skull base and vertebrae: No acute fracture or suspicious osseous lesion. Soft tissues and spinal canal: No prevertebral fluid or swelling. No visible canal hematoma. Disc levels: Degenerative changes in the cervical spine.No high-grade spinal canal stenosis. Upper chest: No focal pulmonary opacity or pleural effusion. IMPRESSION: 1. No acute intracranial process. 2. No acute fracture or traumatic listhesis in the cervical spine. Electronically Signed   By: Wiliam Ke M.D.   On: 01/06/2023 12:57   CT Cervical Spine Wo Contrast  Result Date: 01/06/2023 CLINICAL DATA:  Head and neck trauma, pain EXAM: CT HEAD WITHOUT CONTRAST CT CERVICAL SPINE WITHOUT CONTRAST TECHNIQUE: Multidetector CT imaging of the head and cervical spine was performed following the standard protocol without intravenous contrast. Multiplanar CT image reconstructions of the cervical spine were also generated. RADIATION DOSE REDUCTION: This exam was performed according to the departmental dose-optimization program which includes automated exposure control, adjustment of the mA and/or kV according to patient size and/or use of iterative reconstruction technique. COMPARISON:  12/27/2020 CT head; no prior CT cervical spine, correlation is made with 12/2020 CTA head and neck FINDINGS: CT HEAD FINDINGS Brain: No evidence of acute infarct, hemorrhage, mass, mass effect, or midline shift. No hydrocephalus or extra-axial fluid collection. Periventricular white matter changes, likely the sequela of chronic small vessel  ischemic disease. Vascular: No hyperdense vessel. Skull: Negative for fracture or focal lesion. Sinuses/Orbits: Mucous retention cyst in the left ethmoid air cells. Otherwise clear paranasal sinuses. Status post bilateral lens replacements. Other: The mastoid air cells are well aerated. CT CERVICAL SPINE FINDINGS Alignment: No traumatic listhesis. Preservation of the normal cervical lordosis. Skull base and vertebrae: No acute fracture or suspicious osseous lesion. Soft tissues and spinal canal: No prevertebral fluid or swelling. No visible canal hematoma. Disc levels: Degenerative changes in the cervical spine.No high-grade spinal canal stenosis. Upper chest: No focal pulmonary opacity or pleural effusion. IMPRESSION: 1. No acute intracranial process. 2. No acute fracture or traumatic listhesis in the cervical spine. Electronically Signed   By: Wiliam Ke M.D.   On: 01/06/2023 12:57   IR BLOCK INJ HYPOGASTRIC PLEXUS  Result Date: 12/23/2022 CLINICAL DATA:  82 year old female with advanced stage ovarian cancer with retractable pelvic pain. EXAM: Fluoroscopic guided superior hypogastric nerve block. ANESTHESIA/SEDATION: Moderate (conscious) sedation was employed during this procedure. A total of Versed 1 mg and Fentanyl 50 mcg was administered intravenously. Moderate Sedation Time: 10 minutes. The patient's level of consciousness and vital signs were monitored continuously by radiology nursing throughout the procedure under my direct supervision. MEDICATIONS: 80 mg Depo-Medrol, 8 mL 0.5% bupivacaine CONTRAST:  2mL OMNIPAQUE IOHEXOL 300 MG/ML  SOLN COMPLICATIONS: None immediate PROCEDURE: Fluoroscopic guided superior hypogastric nerve block. FINDINGS: The patient was positioned supine on the IR table. The lower abdomen was prepped and draped in standard fashion. The  procedure was planned. Under direct fluoroscopic visualization, a 22 gauge Chiba needle was directed to the anterior aspect of the L5 vertebral  body. 1% lidocaine was injected which flowed freely without resistance. Contrast was injected which demonstrated opacification in the region of the superior hypogastric nerve without evidence of vascular opacification. Next, a solution of 80 mg Depo-Medrol and 8 mL 0.5% bupivacaine was administered. The needle was then removed. The patient tolerated procedure well was transferred back to the floor in good condition. IMPRESSION: Technically successful fluoroscopic guided superior hypogastric nerve block with local anesthetic and steroid. Marliss Coots, MD [facility] Rad[facility] Electronically Signed   By: Marliss Coots M.D.   On: 12/23/2022 16:56   CT T-SPINE NO CHARGE  Result Date: 12/20/2022 CLINICAL DATA:  Study identified as missing a report at 4:13 am on 12/20/2022. 82 year old female with abdomen and spine pain.  Ovarian cancer. EXAM: CT THORACIC SPINE WITH CONTRAST TECHNIQUE: Multiplanar CT images of the thoracic spine were reconstructed from contemporary CT of the Abdomen. RADIATION DOSE REDUCTION: This exam was performed according to the departmental dose-optimization program which includes automated exposure control, adjustment of the mA and/or kV according to patient size and/or use of iterative reconstruction technique. CONTRAST:  No additional COMPARISON:  CT Abdomen and Pelvis, lumbar spine CT from the same time reported separately. Restaging CT Chest, Abdomen, and Pelvis 10/15/2022. Outside thoracic spine MRI 02/22/2021. FINDINGS: Limited cervical spine imaging: Cervicothoracic junction alignment is within normal limits. Thoracic spine segmentation:  Normal. Alignment: Stable thoracic kyphosis. No significant scoliosis or spondylolisthesis. Vertebrae: Mild chronic superior endplate deformities at T2, T4, T6, T9 are all stable since the 2022 MRI. Background bone mineralization is stable. Small round sclerotic bone lesions in T9 and T10 appear new from  the 2022 MRI and are both mildly larger since August (now up to 8 mm versus 4-5 mm previously). Smaller similar T5 and T6 inferior endplate sclerotic foci have also progressed Increased. No destructive osseous lesion in the thoracic spine. No acute pathologic fracture. Visible ribs appear intact. Lumbar spine is detailed separately. Paraspinal and other soft tissues: Stable right chest Port-A-Cath. Calcified aortic atherosclerosis. Calcified coronary artery atherosclerosis. No pericardial effusion. Lower lung volumes. Small new layering left and trace right pleural effusions. Dependent and lung base atelectasis. Major airways remain patent. Abdomen is reported separately. Thoracic paraspinal soft tissues remain normal. Disc levels: Mild for age thoracic spine degeneration superimposed on the above findings. Degenerative appearing posterior element ankylosis in the upper thoracic spine at least T3 through T6. No CT evidence of thoracic spinal stenosis. IMPRESSION: 1. Small but progressive sclerotic bone metastases in thoracic vertebrae since August restaging CT. 2. No associated pathologic fracture. Multilevel mild chronic compression fractures in the thoracic spine are stable since 2022. 3. New small pleural effusions, pulmonary atelectasis. 4. Abdomen and Lumbar spine are detailed separately. Electronically Signed   By: Odessa Fleming M.D.   On: 12/20/2022 04:23   CT L-SPINE NO CHARGE  Result Date: 12/19/2022 CLINICAL DATA:  Back pain, lower extremity radicular pain EXAM: CT LUMBAR SPINE WITHOUT CONTRAST TECHNIQUE: Multidetector CT imaging of the lumbar spine was performed without intravenous contrast administration. Multiplanar CT image reconstructions were also generated. RADIATION DOSE REDUCTION: This exam was performed according to the departmental dose-optimization program which includes automated exposure control, adjustment of the mA and/or kV according to patient size and/or use of iterative reconstruction  technique. COMPARISON:  None Available. FINDINGS: Segmentation: 5 lumbar type vertebrae. Alignment: Normal. Vertebrae:  L4-5 anterior posterior lumbar fusion with instrumentation again noted. Degenerative ankylosis of the anterior posterior elements of L5-S1 noted. Chronic L1 compression deformity with approximately 50% loss of height is unchanged. Sclerotic foci within the L2 and L5 vertebral bodies is again identified suspicious for sclerotic osseous metastases as these are stable since prior examination, but are new since remote prior examination of 03/17/2022. No acute fracture. Paraspinal and other soft tissues: Negative paraspinal soft tissues. Mass within the right hemipelvis is better assessed on accompanying CT examination of the abdomen and pelvis. Disc levels: Intervertebral disc space narrowing and endplate remodeling at L3-4 in keeping with changes of advanced degenerative disc disease. Remaining intervertebral disc heights are preserved. Eccentric left paracentral/foraminal disc bulge at L3-4 noted. Facet hypertrophy results in mild left and moderate right neuroforaminal narrowing at this level. Streak artifact limits evaluation of the neural foramina and spinal canal at L4-5. Bilateral laminectomy and posterior decompression of L5 has been performed. No significant neuroforaminal narrowing or central canal stenosis at this level. IMPRESSION: 1. No acute fracture or listhesis of the lumbar spine. 2. Stable chronic L1 compression deformity with approximately 50% loss of height. 3. Stable sclerotic foci within the L2 and L5 vertebral bodies suspicious for sclerotic osseous metastases as these are stable since prior examination, but are new since remote prior examination of 03/17/2022. 4. L4-5 anterior posterior lumbar fusion with instrumentation. Degenerative ankylosis of the anterior posterior elements of L5-S1. 5. Advanced degenerative disease at L3-4 resulting in mild left and moderate right  neuroforaminal narrowing at this level. Electronically Signed   By: Helyn Numbers M.D.   On: 12/19/2022 20:39   CT ABDOMEN PELVIS W CONTRAST  Result Date: 12/19/2022 CLINICAL DATA:  Abdominal pain EXAM: CT ABDOMEN AND PELVIS WITH CONTRAST TECHNIQUE: Multidetector CT imaging of the abdomen and pelvis was performed using the standard protocol following bolus administration of intravenous contrast. RADIATION DOSE REDUCTION: This exam was performed according to the departmental dose-optimization program which includes automated exposure control, adjustment of the mA and/or kV according to patient size and/or use of iterative reconstruction technique. CONTRAST:  OMNIPAQUE IOHEXOL 300 MG/ML  SOLN COMPARISON:  10/15/2022 FINDINGS: Lower chest: Trace left pleural effusion. Dependent atelectasis. Coronary artery and aortic atherosclerosis. Hepatobiliary: No focal hepatic abnormality. Gallbladder unremarkable. Pancreas: No focal abnormality or ductal dilatation. Spleen: No focal abnormality.  Normal size. Adrenals/Urinary Tract: No adrenal abnormality. No focal renal abnormality. No stones or hydronephrosis. Urinary bladder is unremarkable. Stomach/Bowel: Stomach, large and small bowel grossly unremarkable. Vascular/Lymphatic: Aortic atherosclerosis. No evidence of aneurysm or adenopathy. Reproductive: Prior hysterectomy. Mixed solid and cystic mass in the right ovary again measuring 3.9 x 3.4 cm, compared to 3.9 x 3.5 cm previously, unchanged. Other: Small amount of free fluid adjacent to the liver, spleen and in the pelvis. This is new since prior study. Musculoskeletal: Sclerotic area in the posterior L2 vertebral body, 7 mm compared to 6 mm previously. 1.5 cm sclerotic posterior L5 lesion, stable. Chronic moderate L1 compression deformity, stable. Sclerotic lesions in the T9 and T10 vertebral body, similar to prior study. IMPRESSION: Stable left ovarian mass in this patient with known ovarian cancer.  Essentially stable sclerotic foci in the thoracic and lumbar spine. Stable moderate compression fracture at L1. Small amount of ascites in the abdomen and pelvis, new since prior study. Trace left pleural effusion. Dependent atelectasis in the lower lobes. Aortic atherosclerosis.  Coronary artery disease. Electronically Signed   By: Charlett Nose M.D.   On: 12/19/2022 20:15  US Pelvis Complete  Result Date: 12/19/2022 CLINICAL DATA:  Right lower quadrant pain history of ovarian cancer EXAM: TRANSABDOMINAL ULTRASOUND OF PELVIS DOPPLER ULTRASOUND OF OVARIES TECHNIQUE: Transabdominal ultrasound examination of the pelvis was performed including evaluation of the uterus, ovaries, adnexal regions, and pelvic cul-de-sac. Color and duplex Doppler ultrasound was utilized to evaluate blood flow to the ovaries. COMPARISON:  CT 10/15/2022 FINDINGS: Uterus Hysterectomy Endometrium Hysterectomy Right ovary Measurements: 2.5 x 2.2 x 2.7 cm = volume: 8.9 mL. Cystic and soft tissue appearance of the right ovary presumably corresponding to the right adnexal mass seen on multiple prior CT imaging. This measures 3.8 x 3.9 x 4 cm. Left ovary Oophorectomy Pulsed Doppler evaluation demonstrates normal low-resistance arterial and venous waveforms in the right ovary and associated mass Other: Small volume free fluid in the pelvis. IMPRESSION: 1. Status post hysterectomy and left oophorectomy. 2. 4 cm cystic and soft tissue appearance of the right ovary presumably corresponding to the given history and right adnexal mass seen on multiple prior CT imaging. No convincing evidence for torsion on this exam. 3. Small volume free fluid in the pelvis. Electronically Signed   By: Jasmine Pang M.D.   On: 12/19/2022 19:17   [place] Art/Ven Flow Abd Pelv Doppler  Result Date: 12/19/2022 CLINICAL DATA:  Right lower quadrant pain history of ovarian cancer EXAM: TRANSABDOMINAL ULTRASOUND OF PELVIS DOPPLER ULTRASOUND OF OVARIES TECHNIQUE:  Transabdominal ultrasound examination of the pelvis was performed including evaluation of the uterus, ovaries, adnexal regions, and pelvic cul-de-sac. Color and duplex Doppler ultrasound was utilized to evaluate blood flow to the ovaries. COMPARISON:  CT 10/15/2022 FINDINGS: Uterus Hysterectomy Endometrium Hysterectomy Right ovary Measurements: 2.5 x 2.2 x 2.7 cm = volume: 8.9 mL. Cystic and soft tissue appearance of the right ovary presumably corresponding to the right adnexal mass seen on multiple prior CT imaging. This measures 3.8 x 3.9 x 4 cm. Left ovary Oophorectomy Pulsed Doppler evaluation demonstrates normal low-resistance arterial and venous waveforms in the right ovary and associated mass Other: Small volume free fluid in the pelvis. IMPRESSION: 1. Status post hysterectomy and left oophorectomy. 2. 4 cm cystic and soft tissue appearance of the right ovary presumably corresponding to the given history and right adnexal mass seen on multiple prior CT imaging. No convincing evidence for torsion on this exam. 3. Small volume free fluid in the pelvis. Electronically Signed   By: Jasmine Pang M.D.   On: 12/19/2022 19:17

## 2023-01-13 NOTE — Progress Notes (Signed)
Patient ID: Alexandria Mclaughlin, female   DOB: 07/10/1940, 82 y.o.   MRN: [id number] [facility] Surgery Progress Note     Subjective: CC-  Continues to have mostly left sided abdominal pain. No emesis. Still with some nausea at times but has not required zofran in >24hr. No BM since admission, unsure if passing any flatus.   Objective: Vital signs in last 24 hours: Temp:  [97.6 F (36.4 C)-97.9 F (36.6 C)] 97.8 F (36.6 C) (11/19 0535) Pulse Rate:  [84-100] 100 (11/19 0535) Resp:  [14-18] 18 (11/19 0535) BP: (119-148)/(67-85) 148/80 (11/19 0535) SpO2:  [94 %-99 %] 96 % (11/19 0535) Last BM Date : 01/11/23  Intake/Output from previous day: 11/18 0701 - 11/19 0700 In: 2138.6 [P.O.:60; I.V.:2078.6] Out: 400 [Urine:400] Intake/Output this shift: No intake/output data recorded.  PE: Gen:  Alert, NAD, pleasant Pulm: rate and effort normal Abd: soft, nondistended, mild global tenderness, worse in the LUQ, few bowel sounds heard  Lab Results:  Recent Labs    01/12/23 1128 01/13/23 0206  WBC 10.5 8.6  HGB 9.1* 8.7*  HCT 29.8* 27.9*  PLT 185 167   BMET Recent Labs    01/12/23 1128 01/13/23 0206  NA 133* 133*  K 4.0 3.5  CL 97* 97*  CO2 21* 25  GLUCOSE 88 73  BUN 17 15  CREATININE 0.43* 0.43*  CALCIUM 8.9 8.7*   PT/INR No results for input(s): "LABPROT", "INR" in the last 72 hours. CMP     Component Value Date/Time   NA 133 (L) 01/13/2023 0206   NA 139 09/19/2015 1422   K 3.5 01/13/2023 0206   CL 97 (L) 01/13/2023 0206   CO2 25 01/13/2023 0206   GLUCOSE 73 01/13/2023 0206   BUN 15 01/13/2023 0206   BUN 20 09/19/2015 1422   CREATININE 0.43 (L) 01/13/2023 0206   CREATININE 0.53 11/27/2022 1118   CALCIUM 8.7 (L) 01/13/2023 0206   PROT 5.6 (L) 01/12/2023 1128   PROT 7.0 09/19/2015 1422   ALBUMIN 2.3 (L) 01/12/2023 1128   ALBUMIN 4.6 09/19/2015 1422   AST 20 01/12/2023 1128   AST 12 (L) 11/27/2022 1118   ALT 17 01/12/2023 1128   ALT 6 11/27/2022 1118    ALKPHOS 150 (H) 01/12/2023 1128   BILITOT 1.1 01/12/2023 1128   BILITOT 0.5 11/27/2022 1118   GFRNONAA >60 01/13/2023 0206   GFRNONAA >60 11/27/2022 1118   GFRAA >60 04/20/2018 1452   Lipase     Component Value Date/Time   LIPASE 45 01/12/2023 1128       Studies/Results: CT ABDOMEN PELVIS W CONTRAST  Result Date: 01/11/2023 CLINICAL DATA:  Nausea and vomiting.  History of ovarian cancer. EXAM: CT ABDOMEN AND PELVIS WITH CONTRAST TECHNIQUE: Multidetector CT imaging of the abdomen and pelvis was performed using the standard protocol following bolus administration of intravenous contrast. RADIATION DOSE REDUCTION: This exam was performed according to the departmental dose-optimization program which includes automated exposure control, adjustment of the mA and/or kV according to patient size and/or use of iterative reconstruction technique. CONTRAST:  OMNIPAQUE IOHEXOL 300 MG/ML  SOLN COMPARISON:  December 19, 2022 FINDINGS: Lower chest: No acute abnormality. Hepatobiliary: New subcapsular fluid collection in the right liver measures water density, and measures 11.9 x 5.9 x 15 cm. Otherwise normal attenuation of the liver. The gallbladder is normally distended. Pancreas: Unremarkable. No pancreatic ductal dilatation or surrounding inflammatory changes. Spleen: Normal in size without focal abnormality. Adrenals/Urinary Tract: Adrenal glands are unremarkable. Kidneys  are normal, without renal calculi, focal lesion, or hydronephrosis. Bladder is unremarkable. Stomach/Bowel: Sub pathologic gas and fluid distension of small bowels in the mid abdomen, which measure up to 2.8 cm. Preserved colonic pattern. Vascular/Lymphatic: Aortic atherosclerosis. No enlarged abdominal or pelvic lymph nodes. Reproductive: Right adnexal cystic and solid mass measures 3.8 x 4.2 x 2.7 cm., slightly enlarged. Loculated tubular fluid collection is seen extending from the mass to the central pelvis. Post hysterectomy.  Other: No abdominal wall hernia. Musculoskeletal: Sclerotic metastatic disease in the thoracolumbar spine is mostly stable. No new compression fractures. Chronic moderate L1 compression deformity. IMPRESSION: 1. New subcapsular fluid collection in the right liver measures water density, and measures 11.9 x 5.9 x 15 cm. 2. Sub pathologic gas and fluid distension of small bowels in the mid abdomen, with cross-section measuring up to 2.8 cm. Findings are concerning for early or partial small bowel obstruction. 3. Right adnexal cystic and solid mass measures 3.8 x 4.2 x 2.7 cm, slightly enlarged. Loculated tubular fluid collection is seen extending from the mass to the central pelvis, also slightly enlarged. 4. Sclerotic metastatic disease in the thoracolumbar spine is mostly stable. 5. Chronic moderate L1 compression deformity. 6. Aortic atherosclerosis. Aortic Atherosclerosis (ICD10-I70.0). Electronically Signed   By: Ted Mcalpine M.D.   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  -CT abdomen pelvis significant for new subcapsular fluid collection of the right liver along with air fluid levels of the small bowel concerning for possible early SBO.   -H/h stable -Xray this morning pending but I do not see any severe dilated small bowel, and there's air in the colon. Trial clear liquids. Will also order bowel regimen with miralax and suppository.  -oncology and palliative to see   FEN - CLD VTE - SCD's, ok for chemical dvt ppx from surgical standpoint ID - none currently  I reviewed hospitalist notes, last 24 h vitals and pain scores, last 48 h intake and  output, last 24 h labs and trends, and last 24 h imaging results.    LOS: 2 days    Franne Forts, [facility] Surgery 01/13/2023, 9:46 AM Please see Amion for pager number during day hours 7:00am-4:30pm

## 2023-01-13 NOTE — Progress Notes (Signed)
[facility] Progress Note Patient: Alexandria Mclaughlin WNU:[id number] DOB: 03-29-1940 DOA: 01/11/2023  DOS: the patient was seen and examined on 01/13/2023  Brief hospital course: PMH of metastatic high-grade ovarian cancer on chemotherapy, BPPV, CVA, CAD, GERD, HLD, hypothyroidism.  Presented to the hospital with complaints of nausea and vomiting as well as abdominal pain. Workup in the ED showed evidence of SBO as well as new subcapsular perihepatic fluid collection.  Radiation GYN oncology was consulted. General surgery was consulted as well. Currently plan is conservative management. Assessment and Plan: Possible SBO versus severe constipation. Ongoing complaint of nausea vomiting as well as abdominal pain. Reports she is not passing any gas today. General surgery was consulted and highly appreciate their assistance. Diet advanced to clear liquid diet. Will monitor.  Suppository and enema were given without any success.  Subcapsular fluid collection of the liver. Patient has history of peritoneal carcinomatosis. Fluid collection could be multifactorial. Possible hematoma from recent fall cannot be ruled out. Given this stable hemoglobin no further workup recommended.  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 SP neoadjuvant chemotherapy. Sees Dr. Bertis Ruddy as outpatient. Evaluated by GYN oncology.  No workup recommended for now. Palliative care recommended.  Chronic pain syndrome. Likely because of her severe constipation. Home regimen includes Cymbalta, fentanyl patch 50 mcg, gabapentin 600 mg 4 times daily, Percocet 10/325 every 6 hours as needed.  Lyrica 50 mg 3 times daily. For now we will monitor.  HLD. On Crestor. Currently on hold.  HTN. Due to patient's n.p.o. status as well as blood pressure stability, currently holding home regimen.  GERD. PPI.  Iron deficiency anemia. On supplements. Monitor.  Hypokalemia.  Hypomagnesemia Currently being  replaced. Monitor.  Subjective: Continues with complaints of obstipation.  Also reports nausea.  Also reports abdominal cramp.  Physical Exam: In moderate distress. S1-S2 present No murmur. Bowel sound present.  Diffusely tender. No edema.  Data Reviewed: I have Reviewed nursing notes, Vitals, and Lab results. Reviewed CBC and CMP.  Reordered CBC and CMP.  Disposition: Status is: Inpatient Remains inpatient appropriate because: Need to tolerate oral diet.  SCDs Start: 01/11/23 1824   Family Communication: No one at bedside Level of care: Telemetry continue for now due to hypokalemia. Vitals:   01/12/23 1725 01/12/23 2035 01/13/23 0535 01/13/23 1351  BP: 129/85 122/73 (!) 148/80 131/70  Pulse: 100 88 100 90  Resp: 14 18 18 14   Temp: 97.6 F (36.4 C) 97.9 F (36.6 C) 97.8 F (36.6 C) 98.1 F (36.7 C)  TempSrc: Oral Oral Oral Oral  SpO2: 99% 96% 96% 97%  Weight:      Height:         Author: Lynden Oxford, MD 01/13/2023 6:29 PM  Please look on [email or web address] to find out who is on call.

## 2023-01-13 NOTE — Consult Note (Signed)
Consultation Note Date: 01/13/2023   Patient Name: Alexandria Mclaughlin  DOB: 06-23-1940  MRN: [id number]  Age / Sex: 82 y.o., female   PCP: Patient, No Pcp Per Referring Physician: Rolly Salter, MD  Reason for Consultation: Establishing goals of care     Chief Complaint/History of Present Illness:   Patient is an 82 year old female with a past medical history of high-grade ovarian cancer on chemotherapy, EP PVD, CVA, CAD, GERD, hyperlipidemia, and hypothyroidism who was admitted on 01/11/2023 for management of nausea, vomiting, and abdominal pain.  Upon admission, imaging showed evidence of SBO as well as new subcapsular perihepatic fluid collection.  Gyn Onc, general surgery, and oncology consulted for evaluation.  Patient deemed not to be a surgical candidate and recommended supportive care.  Palliative medicine team consulted to assist with complex medical decision making. Of note, patient known to palliative provider from prior hospitalization.  Extensive review of EMR prior to presenting to bedside.  Presented to bedside to meet with patient in the morning.  Patient had just met with surgery team and noted that she is looking forward to starting liquid diet.  Again introduced myself as a member of the palliative medicine team and my role in patient's medical journey.  Inquired about patient's symptom burden at this time.  Patient feels that her pain is controlled as long as she does not move.  Patient notes that her medications were adjusted by her outpatient chronic pain provider.  Tried to review these with her that she was unable to remember specific doses.  Noted would reach out to chronic pain provider to make sure patient is receiving these medications in the inpatient setting.  Patient did not want her pain medications currently adjusted. Patient noting her primary symptom is constipation.  She noted that surgery team stated she could be started on liquid diet.  Noted would follow-up on  this and make sure she has medications for her constipation.  Inquired what patient was hoping for moving forward.  Patient notes that she is awaiting further discussion with oncology to determine what will be offered.  She noted she had heard that it was "too early to tell" if she could receive further chemotherapy.   Inquired how patient did at home after she was discharged last time.  Patient notes that it "did not go well".  She noted that she was having a rough spot with her son and she was feeling weaker.  She noted that things have improved with her son since they live together.  She is hopeful to go to rehab after this hospitalization.  Noted importance of continued conversations based on oncologist's input regarding further cancer directed therapies.  All questions answered at that time.  Noted palliative medicine team and continue to follow along with patient's medical journey.  Discussed with IDT after patient's visit including hospitalist, patient chronic pain provider, RN, and surgery team.  Primary Diagnoses  Present on Admission:  Peritoneal carcinomatosis (HCC)  Cancer associated pain  Ovarian cancer (HCC)  Intraabdominal fluid collection   Palliative Review of Systems: constipation  Past Medical History:  Diagnosis Date   Abnormality of gait 02/07/2015   Acquired hallux rigidus of left foot 12/17/2020   Acquired unequal leg length on left 08/08/2011   AMS (altered mental status) 12/28/2020   Biceps tendonitis on left 08/18/2018   BPPV (benign paroxysmal positional vertigo) 02/07/2015   Bunion 12/17/2020   Cancer (HCC) 04/25/2022   Cerebrovascular disease    Cervical facet syndrome  Coronary artery disease 04/27/2022   Disorders of sacrum    Enthesopathy of hip region    Essential hypertension 02/07/2015   Generalized anxiety disorder    GERD (gastroesophageal reflux disease)    Headache    History of kidney stones    HLD (hyperlipidemia) 02/07/2015   Low  back pain 02/07/2015   Lumbar facet arthropathy 07/05/2014   Lumbar post-laminectomy syndrome 06/11/2011   Lumbar radicular pain 07/29/2016   Lumbar radiculopathy 04/26/2018   Major depressive disorder    Mild cognitive impairment of uncertain or unknown etiology 01/24/2022   Multiple lacunar infarcts    MRI - bilateral basal ganglia and left thalamus   Myoclonus 09/19/2015   Rotator cuff tendonitis, left 06/08/2018   Sacroiliac joint dysfunction 06/11/2011   Sciatic neuritis    Stroke (HCC)    hx TIA   Subcortical infarction    2016 MRI - small chronic subcortical infarct with associated chronic hemosiderin deposition within the right superior frontal gyrus.   Synovitis and tenosynovitis    Therapeutic opioid induced constipation 07/25/2015   Thyroid disease    Transient left leg weakness    Vision abnormalities    Social History   Socioeconomic History   Marital status: Widowed    Spouse name: Not on file   Number of children: Not on file   Years of education: 14   Highest education level: Associate degree: academic program  Occupational History   Occupation: Retired  Tobacco Use   Smoking status: Former    Current packs/day: 0.00    Average packs/day: 0.3 packs/day for 15.0 years (3.8 ttl pk-yrs)    Types: Cigarettes    Start date: 03/15/1953    Quit date: 03/15/1968    Years since quitting: 54.8   Smokeless tobacco: Never  Vaping Use   Vaping status: Never Used  Substance and Sexual Activity   Alcohol use: Not Currently    Comment: occasional glass of wine   Drug use: Yes    Frequency: 20.0 times per week    Types: Fentanyl, Hydrocodone   Sexual activity: Not Currently  Other Topics Concern   Not on file  Social History Narrative   Right handed   Drinks caffeine   Condo two story with Engineer, structural   Social Determinants of Health   Financial Resource Strain: Not on file  Food Insecurity: No Food Insecurity (01/13/2023)   Hunger Vital Sign    Worried About  Running Out of Food in the Last Year: Never true    Ran Out of Food in the Last Year: Never true  Transportation Needs: No Transportation Needs (01/13/2023)   PRAPARE - Administrator, Civil Service (Medical): No    Lack of Transportation (Non-Medical): No  Physical Activity: Not on file  Stress: Stress Concern Present (12/30/2022)   [employer] of Occupational Health - Occupational Stress Questionnaire    Feeling of Stress : To some extent  Social Connections: Unknown (07/04/2021)   Received [facility]   Social Network    Social Network: Not on file   Family History  Problem Relation Age of Onset   Diabetes Mother    Heart disease Mother    Stroke Mother    Heart disease Father    Heart disease Brother    Colon cancer Neg Hx    Breast cancer Neg Hx    Ovarian cancer Neg Hx    Endometrial cancer Neg Hx    Pancreatic cancer Neg  Hx    Prostate cancer Neg Hx    Scheduled Meds:  bisacodyl  10 mg Rectal Once   Chlorhexidine Gluconate Cloth  6 each Topical Daily   DULoxetine  60 mg Oral Daily   fentaNYL  1 patch Transdermal Q72H   gabapentin  600 mg Oral QID   pantoprazole  40 mg Oral Daily   senna-docusate  1 tablet Oral BID   sodium chloride flush  10-40 mL Intracatheter Q12H   traZODone  100 mg Oral QHS   Continuous Infusions:  lactated ringers     magnesium sulfate bolus IVPB     potassium chloride     PRN Meds:.acetaminophen **OR** acetaminophen, clonazePAM, HYDROmorphone (DILAUDID) injection, melatonin, ondansetron **OR** ondansetron (ZOFRAN) IV, oxyCODONE, prochlorperazine, sodium chloride flush No Known Allergies CBC:    Component Value Date/Time   WBC 8.6 01/13/2023 0206   HGB 8.7 (L) 01/13/2023 0206   HGB 11.3 (L) 11/27/2022 1118   HGB 14.2 09/19/2015 1422   HCT 27.9 (L) 01/13/2023 0206   HCT 40.9 09/19/2015 1422   PLT 167 01/13/2023 0206   PLT 235 11/27/2022 1118   PLT 204 09/19/2015 1422   MCV 97.9 01/13/2023 0206    MCV 87 09/19/2015 1422   NEUTROABS 11.7 (H) 01/11/2023 1425   LYMPHSABS 1.1 01/11/2023 1425   MONOABS 1.0 01/11/2023 1425   EOSABS 0.0 01/11/2023 1425   BASOSABS 0.0 01/11/2023 1425   Comprehensive Metabolic Panel:    Component Value Date/Time   NA 133 (L) 01/13/2023 0206   NA 139 09/19/2015 1422   K 3.5 01/13/2023 0206   CL 97 (L) 01/13/2023 0206   CO2 25 01/13/2023 0206   BUN 15 01/13/2023 0206   BUN 20 09/19/2015 1422   CREATININE 0.43 (L) 01/13/2023 0206   CREATININE 0.53 11/27/2022 1118   GLUCOSE 73 01/13/2023 0206   CALCIUM 8.7 (L) 01/13/2023 0206   AST 20 01/12/2023 1128   AST 12 (L) 11/27/2022 1118   ALT 17 01/12/2023 1128   ALT 6 11/27/2022 1118   ALKPHOS 150 (H) 01/12/2023 1128   BILITOT 1.1 01/12/2023 1128   BILITOT 0.5 11/27/2022 1118   PROT 5.6 (L) 01/12/2023 1128   PROT 7.0 09/19/2015 1422   ALBUMIN 2.3 (L) 01/12/2023 1128   ALBUMIN 4.6 09/19/2015 1422    Physical Exam: Vital Signs: BP (!) 148/80 (BP Location: Right Arm)   Pulse 100   Temp 97.8 F (36.6 C) (Oral)   Resp 18   Ht 5\' 3"  (1.6 m)   Wt 69.8 kg   SpO2 96%   BMI 27.27 kg/m  SpO2: SpO2: 96 % O2 Device: O2 Device: Room Air O2 Flow Rate:   Intake/output summary:  Intake/Output Summary (Last 24 hours) at 01/13/2023 0857 Last data filed at 01/13/2023 0600 Gross per 24 hour  Intake 2138.62 ml  Output 400 ml  Net 1738.62 ml   LBM: Last BM Date : 01/11/23 Baseline Weight: Weight: 69.8 kg Most recent weight: Weight: 69.8 kg  General: NAD, awake, chronically ill appearing  HENT: dry mucous membranes Cardiovascular: RRR Respiratory: no increased work of breathing noted, not in respiratory distress Neuro: awake, interactive          Palliative Performance Scale: 30%              Additional Data Reviewed: Recent Labs    01/12/23 1128 01/13/23 0206  WBC 10.5 8.6  HGB 9.1* 8.7*  PLT 185 167  NA 133* 133*  BUN 17 15  CREATININE 0.43* 0.43*    Imaging: CT ABDOMEN PELVIS W  CONTRAST CLINICAL DATA:  Nausea and vomiting.  History of ovarian cancer.  EXAM: CT ABDOMEN AND PELVIS WITH CONTRAST  TECHNIQUE: Multidetector CT imaging of the abdomen and pelvis was performed using the standard protocol following bolus administration of intravenous contrast.  RADIATION DOSE REDUCTION: This exam was performed according to the departmental dose-optimization program which includes automated exposure control, adjustment of the mA and/or kV according to patient size and/or use of iterative reconstruction technique.  CONTRAST:  OMNIPAQUE IOHEXOL 300 MG/ML  SOLN  COMPARISON:  December 19, 2022  FINDINGS: Lower chest: No acute abnormality.  Hepatobiliary: New subcapsular fluid collection in the right liver measures water density, and measures 11.9 x 5.9 x 15 cm. Otherwise normal attenuation of the liver. The gallbladder is normally distended.  Pancreas: Unremarkable. No pancreatic ductal dilatation or surrounding inflammatory changes.  Spleen: Normal in size without focal abnormality.  Adrenals/Urinary Tract: Adrenal glands are unremarkable. Kidneys are normal, without renal calculi, focal lesion, or hydronephrosis. Bladder is unremarkable.  Stomach/Bowel: Sub pathologic gas and fluid distension of small bowels in the mid abdomen, which measure up to 2.8 cm. Preserved colonic pattern.  Vascular/Lymphatic: Aortic atherosclerosis. No enlarged abdominal or pelvic lymph nodes.  Reproductive: Right adnexal cystic and solid mass measures 3.8 x 4.2 x 2.7 cm., slightly enlarged. Loculated tubular fluid collection is seen extending from the mass to the central pelvis. Post hysterectomy.  Other: No abdominal wall hernia.  Musculoskeletal: Sclerotic metastatic disease in the thoracolumbar spine is mostly stable. No new compression fractures. Chronic moderate L1 compression deformity.  IMPRESSION: 1. New subcapsular fluid collection in the right liver  measures water density, and measures 11.9 x 5.9 x 15 cm. 2. Sub pathologic gas and fluid distension of small bowels in the mid abdomen, with cross-section measuring up to 2.8 cm. Findings are concerning for early or partial small bowel obstruction. 3. Right adnexal cystic and solid mass measures 3.8 x 4.2 x 2.7 cm, slightly enlarged. Loculated tubular fluid collection is seen extending from the mass to the central pelvis, also slightly enlarged. 4. Sclerotic metastatic disease in the thoracolumbar spine is mostly stable. 5. Chronic moderate L1 compression deformity. 6. Aortic atherosclerosis.  Aortic Atherosclerosis (ICD10-I70.0).  Electronically Signed   By: Ted Mcalpine M.D.   On: 01/11/2023 16:03    I personally reviewed recent imaging.   Palliative Care Assessment and Plan Summary of Established Goals of Care and Medical Treatment Preferences   Patient is an 82 year old female with a past medical history of high-grade ovarian cancer on chemotherapy, EP PVD, CVA, CAD, GERD, hyperlipidemia, and hypothyroidism who was admitted on 01/11/2023 for management of nausea, vomiting, and abdominal pain.  Upon admission, imaging showed evidence of SBO as well as new subcapsular perihepatic fluid collection.  Gyn Onc, general surgery, and oncology consulted for evaluation.  Patient deemed not to be a surgical candidate and recommended supportive care.  Palliative medicine team consulted to assist with complex medical decision making.  # Complex medical decision making/goals of care  -Extensive discussion with patient held as detailed above in HPI.  Patient awaiting further input from oncology regarding potential for further cancer directed therapies.  Patient voicing hope to regain strength with potentially going to rehab prior to going home.  Noted importance of continued conversations moving forward to best support patient's overall medical journey.   -Family not at bedside today during  visit.  During prior admission, patient's son Alexandria Mclaughlin  had specifically inquired about patient's overall prognosis which at that time noted was less than 6 months and he acknowledged that patient's care at this point was essentially geared towards comfort. Trying to support his mother's wishes regarding care. They live together and Alexandria Mclaughlin helps provide care for patient.   -  Code Status: Limited: Do not attempt resuscitation (DNR) -DNR-LIMITED -Do Not Intubate/DNI   Prognosis: weeks-short months   # Symptom management  -Pain, acute on chronic in setting of high-grade ovarian cancer Discussed with outpatient chronic pain provider, Dr. Riley Kill. Will restart outpatient regimen.    -Continue duloxetine 60 mg daily   -Continue fentanyl patch 50 mcg/h every 72 hours   -Change gabapentin to 300 mg 4 times daily   -Start pregabalin 100 mg 3 times daily   -Continue IV Dilaudid 0.5 mg every 6 hours as needed   -Constipation   -Receiving senna-S 1 tab twice daily as per surgery   -Receiving MiraLAX 17 g daily as per surgery  # Psycho-social/Spiritual Support:  - Support System: son, daughter  # Discharge Planning:  To Be Determined  Thank you for allowing the palliative care team to participate in the care Alexandria Mclaughlin.  Alvester Morin, DO Palliative Care Provider PMT # [phone]  If patient remains symptomatic despite maximum doses, please call PMT at [phone] between 0700 and 1900. Outside of these hours, please call attending, as PMT does not have night coverage.  Personally spent 81 minutes in patient care including extensive chart review (labs, imaging, progress/consult notes, vital signs), medically appropraite exam, discussed with treatment team, education to patient, family, and staff, documenting clinical information, medication review and management, coordination of care, and available advanced directive documents.    *Please note that this is a verbal dictation therefore any spelling  or grammatical errors are due to the "[employer] One" system interpretation.

## 2023-01-13 NOTE — Progress Notes (Signed)
Mobility Specialist - Progress Note   01/13/23 1436  Mobility  Activity Ambulated with assistance in hallway  Level of Assistance Minimal assist, patient does 75% or more  Assistive Device Front wheel walker  Distance Ambulated (ft) 110 ft  Range of Motion/Exercises Active Assistive  Activity Response Tolerated well  Mobility Referral Yes  $Mobility charge 1 Mobility  Mobility Specialist Start Time (ACUTE ONLY) 1420  Mobility Specialist Stop Time (ACUTE ONLY) 1436  Mobility Specialist Time Calculation (min) (ACUTE ONLY) 16 min   Pt was found in bed wanting to ambulate. Stated having a sore abdomin. At EOS returned to recliner chair with all needs met. Call bell in reach and LPN notified.  Billey Chang Mobility Specialist

## 2023-01-14 ENCOUNTER — Telehealth: Payer: Self-pay

## 2023-01-14 DIAGNOSIS — K56600 Partial intestinal obstruction, unspecified as to cause: Secondary | ICD-10-CM | POA: Diagnosis not present

## 2023-01-14 DIAGNOSIS — D509 Iron deficiency anemia, unspecified: Secondary | ICD-10-CM

## 2023-01-14 DIAGNOSIS — Z515 Encounter for palliative care: Secondary | ICD-10-CM | POA: Diagnosis not present

## 2023-01-14 DIAGNOSIS — G893 Neoplasm related pain (acute) (chronic): Secondary | ICD-10-CM

## 2023-01-14 DIAGNOSIS — F1190 Opioid use, unspecified, uncomplicated: Secondary | ICD-10-CM

## 2023-01-14 DIAGNOSIS — D638 Anemia in other chronic diseases classified elsewhere: Secondary | ICD-10-CM | POA: Diagnosis not present

## 2023-01-14 DIAGNOSIS — C786 Secondary malignant neoplasm of retroperitoneum and peritoneum: Secondary | ICD-10-CM

## 2023-01-14 DIAGNOSIS — C561 Malignant neoplasm of right ovary: Secondary | ICD-10-CM

## 2023-01-14 DIAGNOSIS — E876 Hypokalemia: Secondary | ICD-10-CM

## 2023-01-14 DIAGNOSIS — R188 Other ascites: Secondary | ICD-10-CM | POA: Diagnosis not present

## 2023-01-14 LAB — CBC
HCT: 35.9 % — ABNORMAL LOW (ref 36.0–46.0)
Hemoglobin: 11.8 g/dL — ABNORMAL LOW (ref 12.0–15.0)
MCH: 30.5 pg (ref 26.0–34.0)
MCHC: 32.9 g/dL (ref 30.0–36.0)
MCV: 92.8 fL (ref 80.0–100.0)
Platelets: 184 10*3/uL (ref 150–400)
RBC: 3.87 MIL/uL (ref 3.87–5.11)
RDW: 15.5 % (ref 11.5–15.5)
WBC: 12.5 10*3/uL — ABNORMAL HIGH (ref 4.0–10.5)
nRBC: 0 % (ref 0.0–0.2)

## 2023-01-14 LAB — BASIC METABOLIC PANEL
Anion gap: 11 (ref 5–15)
BUN: 9 mg/dL (ref 8–23)
CO2: 26 mmol/L (ref 22–32)
Calcium: 8.8 mg/dL — ABNORMAL LOW (ref 8.9–10.3)
Chloride: 97 mmol/L — ABNORMAL LOW (ref 98–111)
Creatinine, Ser: 0.5 mg/dL (ref 0.44–1.00)
GFR, Estimated: >60 mL/min (ref >60)
Glucose, Bld: 115 mg/dL — ABNORMAL HIGH (ref 70–99)
Potassium: 4.4 mmol/L (ref 3.5–5.1)
Sodium: 134 mmol/L — ABNORMAL LOW (ref 135–145)

## 2023-01-14 LAB — MAGNESIUM: Magnesium: 1.9 mg/dL (ref 1.7–2.4)

## 2023-01-14 MED ORDER — BISACODYL 10 MG RE SUPP
10.0000 mg | Freq: Once | RECTAL | Status: AC
  Administered 2023-01-14: 10 mg via RECTAL
  Filled 2023-01-14: qty 1

## 2023-01-14 MED ORDER — POLYETHYLENE GLYCOL 3350 17 G PO PACK
17.0000 g | PACK | Freq: Two times a day (BID) | ORAL | Status: DC
  Administered 2023-01-14 – 2023-01-15 (×4): 17 g via ORAL
  Filled 2023-01-14 (×4): qty 1

## 2023-01-14 MED ORDER — FLEET ENEMA RE ENEM
1.0000 | ENEMA | Freq: Once | RECTAL | Status: AC
  Administered 2023-01-14: 1 via RECTAL
  Filled 2023-01-14: qty 1

## 2023-01-14 MED ORDER — BISACODYL 10 MG RE SUPP
10.0000 mg | Freq: Every day | RECTAL | Status: DC
  Administered 2023-01-15: 10 mg via RECTAL
  Filled 2023-01-14 (×2): qty 1

## 2023-01-14 MED ORDER — ENOXAPARIN SODIUM 40 MG/0.4ML IJ SOSY
40.0000 mg | PREFILLED_SYRINGE | Freq: Every day | INTRAMUSCULAR | Status: DC
  Administered 2023-01-14 – 2023-01-15 (×2): 40 mg via SUBCUTANEOUS
  Filled 2023-01-14 (×2): qty 0.4

## 2023-01-14 MED ORDER — ENSURE ENLIVE PO LIQD
237.0000 mL | Freq: Two times a day (BID) | ORAL | Status: DC
  Administered 2023-01-14: 237 mL via ORAL

## 2023-01-14 NOTE — Progress Notes (Signed)
Daily Progress Note   Patient Name: Alexandria Mclaughlin       Date: 01/14/2023 DOB: 1941-01-08  Age: 82 y.o. MRN#: [id number] Attending Physician: Rodolph Bong, MD Primary Care Physician: Patient, No Pcp Per Admit Date: 01/11/2023 Length of Stay: 3 days  Reason for Consultation/Follow-up: Establishing goals of care  Subjective:   CC: Patient notes overall pain improved except with moving. Following up regarding complex medical decision making.  Subjective:  Reviewed EMR prior to presenting to bedside. Noted to have two bowel movements on 11/19.  Also discussed care with patient's oncologist, Dr. Bertis Ruddy.  Presented to bedside in afternoon to visit with patient.  No family present at bedside.  Patient laying comfortably in the bed.  Again introduced myself to the patient.  Patient appears to have issues recalling prior interactions.  Inquired if patient remembers speaking with her oncologist, Dr. Bertis Ruddy, yesterday and she noted she did not.  Encouraged importance of conversations with patient's children, son and daughter,  and patient to help support discussions with patient having difficulty remembering everything going on.  Patient acknowledged this would be helpful. Inquired where patient's family was this afternoon and she stated that she believes her son is looking at assisted living facilities for her.  She is worried about long-term planning and finances for this.  Again expressed importance of conversations with patient, children, and medical providers to assist in planning.  Inquired about patient's symptom burden at this time.  Patient feels her pain is overall improved.  Patient did note she had pain when trying to work with physical therapy this morning.  Patient has chronically used opioids and has previously known how to take them approximately 30 to 45 minutes ahead of working with physical therapy to help with pain management though she could not recall this during our  conversation today.  Discussed importance of taking oral pain medication ahead of time to give it time to get to her system to help with pain management.  All questions answered at that time.  Noted palliative medicine team will continue to follow with patient's medical journey.   Objective:   Vital Signs:  BP (!) 129/91 (BP Location: Right Arm)   Pulse 99   Temp 98.2 F (36.8 C) (Oral)   Resp 15   Ht 5\' 3"  (1.6 m)   Wt 69.8 kg   SpO2 95%   BMI 27.27 kg/m   Physical Exam: General: NAD, awake, chronically ill appearing Cardiovascular: RRR Respiratory: no increased work of breathing noted, not in respiratory distress Neuro: awake, easily confused, difficulties with recalling conversations   Imaging: I personally reviewed recent imaging.   Assessment & Plan:   Assessment: Patient is an 82 year old female with a past medical history of high-grade ovarian cancer on chemotherapy, EP PVD, CVA, CAD, GERD, hyperlipidemia, and hypothyroidism who was admitted on 01/11/2023 for management of nausea, vomiting, and abdominal pain.  Upon admission, imaging showed evidence of SBO as well as new subcapsular perihepatic fluid collection.  Gyn Onc, general surgery, and oncology consulted for evaluation.  Patient deemed not to be a surgical candidate and recommended supportive care.  Palliative medicine team consulted to assist with complex medical decision making.   Recommendations/Plan: # Complex medical decision making/goals of care:   -Patient having difficulties with recalling conversations with providers, including oncologist, who has been previously well known to patient. Would recommend family meeting with patient's two children and medical team (including oncology) to help better discussed overall medical planning for care  moving forward.                 -  Code Status: Limited: Do not attempt resuscitation (DNR) -DNR-LIMITED -Do Not Intubate/DNI   Prognosis: weeks-short months    #  Symptom management                -Pain, acute on chronic in setting of high-grade ovarian cancer Discussed with outpatient chronic pain provider, Dr. Riley Kill, on 11/19. Will restart outpatient regimen.                                -Continue duloxetine 60 mg daily                               -Continue fentanyl patch 50 mcg/h every 72 hours                               -Continue gabapentin to 300 mg 4 times daily                               -Continue pregabalin 100 mg 3 times daily                               -Continue IV Dilaudid 0.5 mg every 6 hours as needed                  -Constipation                               -Receiving senna-S 1 tab twice daily as per surgery                               -Receiving MiraLAX 17 g daily as per surgery   # Psycho-social/Spiritual Support:  - Support System: son, daughter   # Discharge Planning:  To Be Determined  Discussed with: patient, oncology, hospitalist   Thank you for allowing the palliative care team to participate in the care [name].  Alvester Morin, DO Palliative Care Provider PMT # [phone]  If patient remains symptomatic despite maximum doses, please call PMT at [phone] between 0700 and 1900. Outside of these hours, please call attending, as PMT does not have night coverage.  Personally spent 35 minutes in patient care including extensive chart review (labs, imaging, progress/consult notes, vital signs), medically appropraite exam, discussed with treatment team, education to patient, family, and staff, documenting clinical information, medication review and management, coordination of care, and available advanced directive documents.   *Please note that this is a verbal dictation therefore any spelling or grammatical errors are due to the "Dragon [employer] One" system interpretation.

## 2023-01-14 NOTE — Patient Outreach (Deleted)
{  VBCI TOC PROGRAM NOTES:30402}

## 2023-01-14 NOTE — Progress Notes (Signed)
PROGRESS NOTE    Alexandria Mclaughlin  ZOX:[id number] DOB: 1940/08/31 DOA: 01/11/2023 PCP: Patient, No Pcp Per    No chief complaint on file.   Brief Narrative:  PMH of metastatic high-grade ovarian cancer on chemotherapy, BPPV, CVA, CAD, GERD, HLD, hypothyroidism.  Presented to the hospital with complaints of nausea and vomiting as well as abdominal pain. Workup in the ED showed evidence of SBO as well as new subcapsular perihepatic fluid collection.  Radiation GYN oncology was consulted. General surgery was consulted as well. Currently plan is conservative management.   Assessment & Plan:   Principal Problem:   Intraabdominal fluid collection Active Problems:   Ovarian cancer (HCC)   Peritoneal carcinomatosis (HCC)   Cancer associated pain   History of recent fall   Chronic narcotic use   Anemia of chronic disease   IDA (iron deficiency anemia)   Partial small bowel obstruction (HCC)   Palliative care encounter   Hypokalemia   Hypomagnesemia  #1 possible SBO versus severe constipation -Patient presenting with complaints of nausea vomiting abdominal pain. -Patient with high-grade ovarian cancer which may also be contributing to patient's symptoms. -Patient noted to have passing flatus, stated had a small bowel movement after bowel regimen and fleets enema yesterday and requesting another enema. -Patient seen in consultation by general surgery recommending conservative treatment for now and diet was advanced to a clear liquid diet which she seems to be tolerating. -Diet advanced to a full liquid diet. -Dulcolax suppository x 1 ordered this morning. -Increase MiraLAX to twice daily. -Continue Senokot-S twice daily. -Fleets enema x 1. -Supportive care.  2.  Subscapular fluid collection of the liver -Patient with history of peritoneal carcinomatosis. -Fluid collection felt to likely be multifactorial. -Could possibly be hematoma secondary to recent fall. -Hemoglobin currently  stable. -General Surgery was following and feel no surgical intervention needed at this time.  3.  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 Emeline Darling such oncology in the outpatient setting who is currently not recommending chemotherapy for patient and do not see a possibility of patient getting stronger or better in the near future and recommending goals of care discussion with palliative care team and consideration for transition to comfort measures. -Palliative care following and to help him symptom management and in discussions with patient. -Appreciate palliative care input and recommendations.  4.  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 -Likely etiology of severe constipation. -Patient being followed by palliative care who are managing patient's pain. -Continue duloxetine 60 mg daily, fentanyl patch 50 mcg/h every 72 hours, gabapentin 300 mg 4 times daily, IV Dilaudid 0.5 mg every 6 hours as needed. -Pregabalin 100 mg 3 times daily started. -Appreciate palliative care input and recommendations.  5.  Hyperlipidemia -Continue to hold statin.  6.  GERD -PPI.  7.  Hypertension -BP currently stable. -Continue to hold home regimen Avapro.  8.  Iron deficiency anemia -H&H stable.  9.  Hypokalemia/hypomagnesemia -Repleted. -Potassium of 4.4, magnesium at 1.9. -Repeat labs in the AM.     DVT prophylaxis: Lovenox Code Status: DNR Family Communication: Updated patient.  No family at bedside. Disposition: TBD  Status is: Inpatient Remains inpatient appropriate because: Severity of illness   Consultants:  GYN oncology: Dr. Ezzard Standing 01/12/2023 General Surgery: Dr.Connor 01/12/2023 Palliative care: Dr. Patterson Hammersmith 01/13/2023 Hematology/oncology: Dr. Bertis Ruddy 01/13/2023  Procedures:  CT abdomen and pelvis 01/11/2023 Abdominal films  01/13/2023   Antimicrobials:  Anti-infectives (  From admission, onward)    None         Subjective: Patient laying in bed.  States does not feel well.  States had a bowel movement after enema yesterday requesting another enema today.  Denies any chest pain or shortness of breath.  Does endorse diffuse abdominal pain.  Some nausea.  Denies any emesis.  Tolerating current liquids.  Objective: Vitals:   01/13/23 2056 01/14/23 0533 01/14/23 1408 01/14/23 2031  BP: (!) 156/98 (!) 129/91 118/82 132/82  Pulse: (!) 101 99 (!) 105 94  Resp: 17 15  15   Temp: 98.2 F (36.8 C) 98.2 F (36.8 C) 97.7 F (36.5 C) 98.8 F (37.1 C)  TempSrc: Oral Oral Oral Oral  SpO2: 96% 95% 94% 92%  Weight:      Height:        Intake/Output Summary (Last 24 hours) at 01/14/2023 2147 Last data filed at 01/14/2023 0600 Gross per 24 hour  Intake 120 ml  Output 0 ml  Net 120 ml   Filed Weights   01/11/23 1356  Weight: 69.8 kg    Examination:  General exam: Appears calm and comfortable  Respiratory system: Clear to auscultation anterior lung fields.  No wheezes, no crackles, no rhonchi.  Fair air movement.  Speaking in full sentences.Marland Kitchen Respiratory effort normal. Cardiovascular system: S1 & S2 heard, RRR. No JVD, murmurs, rubs, gallops or clicks. No pedal edema. Gastrointestinal system: Abdomen is nondistended, soft and some diffuse tenderness to palpation.  Positive bowel sounds.  No rebound.  No guarding.  Central nervous system: Alert and oriented.  Moving extremities spontaneously.  No focal neurological deficits. Extremities: Symmetric 5 x 5 power. Skin: No rashes, lesions or ulcers Psychiatry: Judgement and insight appear normal. Mood & affect appropriate.     Data Reviewed: I have personally reviewed following labs and imaging studies  CBC: Recent Labs  Lab 01/11/23 1425 01/12/23 1128 01/13/23 0206 01/14/23 0629  WBC 13.8* 10.5 8.6 12.5*  NEUTROABS 11.7*  --   --   --   HGB 8.9*  9.1* 8.7* 11.8*  HCT 28.2* 29.8* 27.9* 35.9*  MCV 95.6 99.7 97.9 92.8  PLT 247 185 167 184    Basic Metabolic Panel: Recent Labs  Lab 01/11/23 1425 01/12/23 1128 01/13/23 0206 01/14/23 0108  NA 137 133* 133* 134*  K 2.8* 4.0 3.5 4.4  CL 97* 97* 97* 97*  CO2 24 21* 25 26  GLUCOSE 109* 88 73 115*  BUN 19 17 15 9   CREATININE 0.53 0.43* 0.43* 0.50  CALCIUM 9.5 8.9 8.7* 8.8*  MG 1.5* 1.8 1.6* 1.9  PHOS  --  1.9*  --   --     GFR: Estimated Creatinine Clearance: 50.8 mL/min (by C-G formula based on SCr of 0.5 mg/dL).  Liver Function Tests: Recent Labs  Lab 01/11/23 1425 01/12/23 1128  AST 26 20  ALT 20 17  ALKPHOS 177* 150*  BILITOT 0.8 1.1  PROT 6.2* 5.6*  ALBUMIN 2.5* 2.3*    CBG: No results for input(s): "GLUCAP" in the last 168 hours.   Recent Results (from the past 240 hour(s))  Urine Culture     Status: Abnormal   Collection Time: 01/06/23  3:58 PM   Specimen: Urine, Random  Result Value Ref Range Status   Specimen Description   Final    URINE, RANDOM Performed at [facility], [address] [place], [place] [address]    Special Requests   Final  NONE Reflexed from [id number] Performed at [facility], [address] [place], [place] [address]    Culture >=100,000 COLONIES/mL ENTEROBACTER CLOACAE (A)  Final   Report Status 01/09/2023 FINAL  Final   Organism ID, Bacteria ENTEROBACTER CLOACAE (A)  Final      Susceptibility   Enterobacter cloacae - MIC*    CEFEPIME <=0.12 SENSITIVE Sensitive     CIPROFLOXACIN <=0.25 SENSITIVE Sensitive     GENTAMICIN <=1 SENSITIVE Sensitive     IMIPENEM 0.5 SENSITIVE Sensitive     NITROFURANTOIN 64 INTERMEDIATE Intermediate     TRIMETH/SULFA <=20 SENSITIVE Sensitive     PIP/TAZO <=4 SENSITIVE Sensitive ug/mL    * >=100,000 COLONIES/mL ENTEROBACTER CLOACAE         Radiology Studies: DG Abd 2 Views  Result Date: 01/13/2023 CLINICAL DATA:  82 year old female with nausea,  vomiting, ovarian cancer. Possible small-bowel obstruction on CT 2 days ago. EXAM: ABDOMEN - 2 VIEW COMPARISON:  CT Abdomen and Pelvis 01/10/2022. FINDINGS: Portable AP upright and supine view at 0821 hours. No pneumoperitoneum. Bowel-gas pattern has not significantly changed over the past 2 days. Gas in multiple left and mid abdominal small bowel loops which are at the upper limits of normal. Retained stool in the colon. Gas in the rectum. Stable visualized osseous structures. Previous lumbar fusion. Stable right medial iliac bone cement. Stable elevated right hemidiaphragm. IMPRESSION: Bowel-gas pattern not significantly changed from the CT two days ago. No pneumoperitoneum. Electronically Signed   By: Odessa Fleming M.D.   On: 01/13/2023 14:05        Scheduled Meds:  bisacodyl  10 mg Rectal Daily   Chlorhexidine Gluconate Cloth  6 each Topical Daily   DULoxetine  60 mg Oral Daily   enoxaparin (LOVENOX) injection  40 mg Subcutaneous QHS   feeding supplement  237 mL Oral BID BM   fentaNYL  1 patch Transdermal Q72H   gabapentin  300 mg Oral QID   pantoprazole  40 mg Oral Daily   polyethylene glycol  17 g Oral BID   pregabalin  100 mg Oral TID   senna-docusate  1 tablet Oral BID   sodium chloride flush  10-40 mL Intracatheter Q12H   traZODone  100 mg Oral QHS   Continuous Infusions:   LOS: 3 days    Time spent: 35 minutes    Ramiro Harvest, MD [facility]   To contact the attending provider between 7A-7P or the covering provider during after hours 7P-7A, please log into the web site [email or web address] and access using universal [place] password for that web site. If you do not have the password, please call the hospital operator.  01/14/2023, 9:47 PM

## 2023-01-14 NOTE — Progress Notes (Signed)
Mobility Specialist - Progress Note   01/14/23 1035  Mobility  Activity Dangled on edge of bed  Activity Response Tolerated well  Mobility Referral Yes  $Mobility charge 1 Mobility  Mobility Specialist Start Time (ACUTE ONLY) [phone]  Mobility Specialist Stop Time (ACUTE ONLY) [id number]  Mobility Specialist Time Calculation (min) (ACUTE ONLY) 9 min   Pt received in bed and agreeable to mobility. Once sitting up pt c/o abdomen pain & opted out to sit EOB for ~90min. Pt was minA from supine>sitting. No other complaints during session. Pt to bed after session with all needs met. RN made aware.   [facility]

## 2023-01-14 NOTE — Progress Notes (Signed)
Patient ID: Alexandria Mclaughlin, female   DOB: 29-Feb-1940, 82 y.o.   MRN: [id number] [facility] Surgery Progress Note     Subjective: CC-  Feels about the same with left sided abdominal pain. She has had some reflux but denies nausea or vomiting. Tums helped. Tolerating clear liquids. No flatus this morning but she had BM x2 yesterday.  Objective: Vital signs in last 24 hours: Temp:  [98.1 F (36.7 C)-98.2 F (36.8 C)] 98.2 F (36.8 C) (11/20 0533) Pulse Rate:  [90-101] 99 (11/20 0533) Resp:  [14-17] 15 (11/20 0533) BP: (129-156)/(70-98) 129/91 (11/20 0533) SpO2:  [95 %-97 %] 95 % (11/20 0533) Last BM Date : 01/13/23  Intake/Output from previous day: 11/19 0701 - 11/20 0700 In: 722.1 [P.O.:360; IV Piggyback:362.1] Out: 250 [Urine:250] Intake/Output this shift: No intake/output data recorded.  PE: Gen:  Alert, NAD, pleasant Pulm: rate and effort normal Abd: soft, nondistended, mild global tenderness, worse in the LUQ, bowel sounds present  Lab Results:  Recent Labs    01/13/23 0206 01/14/23 0629  WBC 8.6 12.5*  HGB 8.7* 11.8*  HCT 27.9* 35.9*  PLT 167 184   BMET Recent Labs    01/13/23 0206 01/14/23 0108  NA 133* 134*  K 3.5 4.4  CL 97* 97*  CO2 25 26  GLUCOSE 73 115*  BUN 15 9  CREATININE 0.43* 0.50  CALCIUM 8.7* 8.8*   PT/INR No results for input(s): "LABPROT", "INR" in the last 72 hours. CMP     Component Value Date/Time   NA 134 (L) 01/14/2023 0108   NA 139 09/19/2015 1422   K 4.4 01/14/2023 0108   CL 97 (L) 01/14/2023 0108   CO2 26 01/14/2023 0108   GLUCOSE 115 (H) 01/14/2023 0108   BUN 9 01/14/2023 0108   BUN 20 09/19/2015 1422   CREATININE 0.50 01/14/2023 0108   CREATININE 0.53 11/27/2022 1118   CALCIUM 8.8 (L) 01/14/2023 0108   PROT 5.6 (L) 01/12/2023 1128   PROT 7.0 09/19/2015 1422   ALBUMIN 2.3 (L) 01/12/2023 1128   ALBUMIN 4.6 09/19/2015 1422   AST 20 01/12/2023 1128   AST 12 (L) 11/27/2022 1118   ALT 17 01/12/2023 1128   ALT 6  11/27/2022 1118   ALKPHOS 150 (H) 01/12/2023 1128   BILITOT 1.1 01/12/2023 1128   BILITOT 0.5 11/27/2022 1118   GFRNONAA >60 01/14/2023 0108   GFRNONAA >60 11/27/2022 1118   GFRAA >60 04/20/2018 1452   Lipase     Component Value Date/Time   LIPASE 45 01/12/2023 1128       Studies/Results: DG Abd 2 Views  Result Date: 01/13/2023 CLINICAL DATA:  82 year old female with nausea, vomiting, ovarian cancer. Possible small-bowel obstruction on CT 2 days ago. EXAM: ABDOMEN - 2 VIEW COMPARISON:  CT Abdomen and Pelvis 01/10/2022. FINDINGS: Portable AP upright and supine view at 0821 hours. No pneumoperitoneum. Bowel-gas pattern has not significantly changed over the past 2 days. Gas in multiple left and mid abdominal small bowel loops which are at the upper limits of normal. Retained stool in the colon. Gas in the rectum. Stable visualized osseous structures. Previous lumbar fusion. Stable right medial iliac bone cement. Stable elevated right hemidiaphragm. IMPRESSION: Bowel-gas pattern not significantly changed from the CT two days ago. No pneumoperitoneum. Electronically Signed   By: Odessa Fleming M.D.   On: 01/13/2023 14:05    Anti-infectives: Anti-infectives (From admission, onward)    None        Assessment/Plan High grade ovarian  cancer, possible SBO Subcapsular fluid collection of the liver, possible hematoma, unclear etiology  82 y/o F with right adnexal mass and peritoneal carcinomatosis due to serous ovarian cancer who presents with nausea, vomiting, and abdominal pain.  -CT abdomen pelvis 11/17 significant for new subcapsular fluid collection of the right liver along with air fluid levels of the small bowel concerning for possible early SBO.   - Advance to full liquids and advance diet as tolerated. Continue aggressive bowel regimen. Palliative and oncology have seen. No indication for surgical intervention. We will sign off, please call with questions or concerns.    FEN - FLD VTE  - SCD's, ok for chemical dvt ppx from surgical standpoint ID - none currently  I reviewed Consultant oncology notes, hospitalist notes, last 24 h vitals and pain scores, last 48 h intake and output, and last 24 h labs and trends.    LOS: 3 days    Franne Forts, [facility] Surgery 01/14/2023, 9:30 AM Please see Amion for pager number during day hours 7:00am-4:30pm

## 2023-01-14 NOTE — Patient Outreach (Signed)
  Care Management  Transitions of Care Program Transitions of Care Post-discharge week 3   01/14/2023 Name: Alexandria Mclaughlin MRN: [id number] DOB: 05/01/1940  Subjective: Alexandria Mclaughlin is a 82 y.o. year old female who is a primary care patient of Patient, No Pcp Per.     Consent to Services:    Patient enrolled in the [facility] 30 day program and has since been readmitted to the hospital.  Assessment:    Upon chart review, noted patient was readmitted to the hospital on January 11, 2023.  RNCM will monitor for disposition and discharge plan.    SDOH Interventions    Flowsheet Row Telephone from 12/30/2022 in [place] [facility] ED to Hosp-Admission (Discharged) from 12/19/2022 in [place] [facility] ONCOLOGY  SDOH Interventions    Food Insecurity Interventions Intervention Not Indicated Intervention Not Indicated, Inpatient TOC  Housing Interventions -- Intervention Not Indicated, Inpatient TOC  Transportation Interventions Intervention Not Indicated Intervention Not Indicated, Inpatient TOC  Utilities Interventions -- Intervention Not Indicated, Inpatient TOC  Stress Interventions Patient Declined --        Goals Addressed             This Visit's Progress    TRANSITION OF CARE Plan       Current Barriers:  Knowledge Deficits related to plan of care for management of acute on chronic pain management due to advanced stage ovarian cancer   RNCM Clinical Goal(s):  Patient will work with the Care Management team over the next 30 days to address Transition of Care Barriers: Medication Management Provider appointments Home Health services Functional/Safety Take all medications exactly as prescribed and will call provider for medication related questions as evidenced by verbalization of adequate/baseline pain control (patient pain rating goal-5 or less), notification to her treatment team of inadequate pain control and/or adverse medication effects, ongoing and timely  communication through collaboration with RN Care manager, provider, and care team.   Interventions: Evaluation of current treatment plan related to  self management and patient's adherence to plan as established by provider   Pain Interventions:  (Status:   Readmitted to the hospital )  Pain assessment performed Medications reviewed Discussed importance of adherence to all scheduled medical appointments Counseled on the importance of reporting any/all new or changed pain symptoms or management strategies to pain management provider Advised patient to discuss breakthrough and/or changes in pain with provider  Patient Goals/Self-Care Activities: Participate in Transition of Care Program/Attend [facility] scheduled calls Take all medications as prescribed Attend all scheduled provider appointments Call provider office for new concerns or questions   Follow Up Plan:  Telephone follow up appointment with care management team member scheduled for:  01/08/2023 The patient has been provided with contact information for the care management team and has been advised to call with any health related questions or concerns.          Plan:  RNCM will continue to monitor for disposition and discharge plans.  Evely Gainey Daphine Deutscher BSN, RN RN Care Manager   Transitions of Care [facility] Health Direct Dial Number:  [phone]

## 2023-01-15 ENCOUNTER — Inpatient Hospital Stay: Payer: Medicare HMO | Attending: Psychiatry

## 2023-01-15 DIAGNOSIS — Z66 Do not resuscitate: Secondary | ICD-10-CM

## 2023-01-15 DIAGNOSIS — R188 Other ascites: Secondary | ICD-10-CM | POA: Diagnosis not present

## 2023-01-15 DIAGNOSIS — Z515 Encounter for palliative care: Secondary | ICD-10-CM | POA: Diagnosis not present

## 2023-01-15 DIAGNOSIS — Z7189 Other specified counseling: Secondary | ICD-10-CM

## 2023-01-15 DIAGNOSIS — F1190 Opioid use, unspecified, uncomplicated: Secondary | ICD-10-CM | POA: Diagnosis not present

## 2023-01-15 DIAGNOSIS — K5900 Constipation, unspecified: Secondary | ICD-10-CM

## 2023-01-15 DIAGNOSIS — G893 Neoplasm related pain (acute) (chronic): Secondary | ICD-10-CM | POA: Diagnosis not present

## 2023-01-15 LAB — CBC
HCT: 31.1 % — ABNORMAL LOW (ref 36.0–46.0)
Hemoglobin: 9.5 g/dL — ABNORMAL LOW (ref 12.0–15.0)
MCH: 30.1 pg (ref 26.0–34.0)
MCHC: 30.5 g/dL (ref 30.0–36.0)
MCV: 98.4 fL (ref 80.0–100.0)
Platelets: 199 10*3/uL (ref 150–400)
RBC: 3.16 MIL/uL — ABNORMAL LOW (ref 3.87–5.11)
RDW: 15.4 % (ref 11.5–15.5)
WBC: 9.3 10*3/uL (ref 4.0–10.5)
nRBC: 0 % (ref 0.0–0.2)

## 2023-01-15 LAB — BASIC METABOLIC PANEL
Anion gap: 8 (ref 5–15)
BUN: 13 mg/dL (ref 8–23)
CO2: 28 mmol/L (ref 22–32)
Calcium: 8.8 mg/dL — ABNORMAL LOW (ref 8.9–10.3)
Chloride: 98 mmol/L (ref 98–111)
Creatinine, Ser: 0.41 mg/dL — ABNORMAL LOW (ref 0.44–1.00)
GFR, Estimated: >60 mL/min (ref >60)
Glucose, Bld: 107 mg/dL — ABNORMAL HIGH (ref 70–99)
Potassium: 4 mmol/L (ref 3.5–5.1)
Sodium: 134 mmol/L — ABNORMAL LOW (ref 135–145)

## 2023-01-15 LAB — MAGNESIUM: Magnesium: 1.9 mg/dL (ref 1.7–2.4)

## 2023-01-15 MED ORDER — SMOG ENEMA
960.0000 mL | Freq: Once | RECTAL | Status: DC
  Filled 2023-01-15: qty 960

## 2023-01-15 MED ORDER — METHIMAZOLE 5 MG PO TABS
5.0000 mg | ORAL_TABLET | ORAL | Status: DC
  Filled 2023-01-15: qty 1

## 2023-01-15 MED ORDER — SENNOSIDES-DOCUSATE SODIUM 8.6-50 MG PO TABS
2.0000 | ORAL_TABLET | Freq: Two times a day (BID) | ORAL | Status: DC
  Administered 2023-01-15: 2 via ORAL
  Filled 2023-01-15 (×2): qty 2

## 2023-01-15 MED ORDER — SORBITOL 70 % SOLN
30.0000 mL | Status: AC
  Administered 2023-01-15: 30 mL via ORAL
  Filled 2023-01-15 (×2): qty 30

## 2023-01-15 NOTE — Progress Notes (Signed)
PROGRESS NOTE    Alexandria Mclaughlin  WUJ:[id number] DOB: May 01, 1940 DOA: 01/11/2023 PCP: Patient, No Pcp Per    No chief complaint on file.   Brief Narrative:  PMH of metastatic high-grade ovarian cancer on chemotherapy, BPPV, CVA, CAD, GERD, HLD, hypothyroidism.  Presented to the hospital with complaints of nausea and vomiting as well as abdominal pain. Workup in the ED showed evidence of SBO as well as new subcapsular perihepatic fluid collection.  Radiation GYN oncology was consulted. General surgery was consulted as well. Currently plan is conservative management.   Assessment & Plan:   Principal Problem:   Intraabdominal fluid collection Active Problems:   Ovarian cancer (HCC)   Peritoneal carcinomatosis (HCC)   Cancer associated pain   History of recent fall   Chronic narcotic use   Anemia of chronic disease   IDA (iron deficiency anemia)   Partial small bowel obstruction (HCC)   Palliative care encounter   Hypokalemia   Hypomagnesemia   DNR (do not resuscitate)  #1 possible SBO versus severe constipation -Patient presenting with complaints of nausea vomiting abdominal pain. -Patient with high-grade ovarian cancer which may also be contributing to patient's symptoms. -Patient noted to have passing flatus, stated had a small bowel movement after bowel regimen and fleets enema yesterday and requesting another enema. -Patient seen in consultation by general surgery recommending conservative treatment for now and diet was advanced to a clear liquid diet which she seems to be tolerating. -Diet advanced to a full liquid diet which patient tolerated and patient requesting diet to be further advanced. -Advance diet to soft diet.. -Continue daily Dulcolax suppositories, MiraLAX twice daily, Senokot-S twice daily.   -Patient received fleets enema yesterday and stated had small bowel movement overnight.   -SMOG enema x 1. -Supportive care.  2.  Subscapular fluid collection of the  liver -Patient with history of peritoneal carcinomatosis. -Fluid collection felt to likely be multifactorial. -Could possibly be hematoma secondary to recent fall. -Hemoglobin currently stable. -General Surgery was following and feel no surgical intervention needed at this time.  3.  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 Emeline Darling such oncology in the outpatient setting who is currently not recommending chemotherapy for patient and do not see a possibility of patient getting stronger or better in the near future and recommending goals of care discussion with palliative care team and consideration for transition to comfort measures. -Palliative care following and to help him symptom management and in discussions with patient. -Appreciate palliative care input and recommendations.  4.  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 -Likely etiology of severe constipation. -Patient being followed by palliative care who are managing patient's pain. -Continue duloxetine 60 mg daily, fentanyl patch 50 mcg/h every 72 hours, gabapentin 300 mg 4 times daily, IV Dilaudid 0.5 mg every 6 hours as needed. -Pregabalin 100 mg 3 times daily started. -Appreciate palliative care input and recommendations.  5.  Hyperlipidemia -Statin on hold.   6.  GERD -PPI.  7.  Hypertension -BP currently stable. -Continue to hold Avapro.   8.  Iron deficiency anemia -H&H stable at 9.5.  9.  Hypokalemia/hypomagnesemia -Repleted. -Potassium at 4.0, magnesium at 1.9.       DVT prophylaxis: Lovenox Code Status: DNR Family Communication: Updated patient.  No family at bedside. Disposition: SNF.  Status is: Inpatient Remains inpatient appropriate because: Severity of illness   Consultants:  GYN oncology: Dr. Ezzard Standing 01/12/2023  General Surgery: Dr.Connor 01/12/2023 Palliative  care: Dr. Patterson Hammersmith 01/13/2023 Hematology/oncology: Dr. Bertis Ruddy 01/13/2023  Procedures:  CT abdomen and pelvis 01/11/2023 Abdominal films 01/13/2023   Antimicrobials:  Anti-infectives (From admission, onward)    None         Subjective: Sitting up in bed.  Tolerated full liquids.  Denied any chest pain or shortness of breath.  No nausea or emesis.  Feels pain medication controlling abdominal pain better.  Passing flatus.  Stated had 3 small BMs yesterday still feeling constipated.  Asking for diet to be advanced.  Wants monitor off.  Objective: Vitals:   01/14/23 0533 01/14/23 1408 01/14/23 2031 01/15/23 0621  BP: (!) 129/91 118/82 132/82 135/77  Pulse: 99 (!) 105 94 70  Resp: 15  15 16   Temp: 98.2 F (36.8 C) 97.7 F (36.5 C) 98.8 F (37.1 C) (!) 97.5 F (36.4 C)  TempSrc: Oral Oral Oral Oral  SpO2: 95% 94% 92% 93%  Weight:      Height:        Intake/Output Summary (Last 24 hours) at 01/15/2023 1251 Last data filed at 01/15/2023 0856 Gross per 24 hour  Intake 600 ml  Output 0 ml  Net 600 ml   Filed Weights   01/11/23 1356  Weight: 69.8 kg    Examination:  General exam: NAD Respiratory system: CTAB anterior lung fields.  No wheezes, no crackles, no rhonchi.  Fair air movement.  Speaking in full sentences.   Cardiovascular system: Regular rate rhythm no murmurs rubs or gallops.  No JVD.  No lower extremity edema.  Gastrointestinal system: Abdomen is soft, nondistended, significantly decreased tenderness to palpation.  Positive bowel sounds.  No rebound.  No guarding.  Central nervous system: Alert and oriented.  Moving extremities spontaneously.  No focal neurological deficits. Extremities: Symmetric 5 x 5 power. Skin: No rashes, lesions or ulcers Psychiatry: Judgement and insight appear normal. Mood & affect appropriate.     Data Reviewed: I have personally reviewed following labs and imaging studies  CBC: Recent Labs  Lab 01/11/23 1425 01/12/23 1128  01/13/23 0206 01/14/23 0629 01/15/23 0330  WBC 13.8* 10.5 8.6 12.5* 9.3  NEUTROABS 11.7*  --   --   --   --   HGB 8.9* 9.1* 8.7* 11.8* 9.5*  HCT 28.2* 29.8* 27.9* 35.9* 31.1*  MCV 95.6 99.7 97.9 92.8 98.4  PLT 247 185 167 184 199    Basic Metabolic Panel: Recent Labs  Lab 01/11/23 1425 01/12/23 1128 01/13/23 0206 01/14/23 0108 01/15/23 0330  NA 137 133* 133* 134* 134*  K 2.8* 4.0 3.5 4.4 4.0  CL 97* 97* 97* 97* 98  CO2 24 21* 25 26 28   GLUCOSE 109* 88 73 115* 107*  BUN 19 17 15 9 13   CREATININE 0.53 0.43* 0.43* 0.50 0.41*  CALCIUM 9.5 8.9 8.7* 8.8* 8.8*  MG 1.5* 1.8 1.6* 1.9 1.9  PHOS  --  1.9*  --   --   --     GFR: Estimated Creatinine Clearance: 50.8 mL/min (A) (by C-G formula based on SCr of 0.41 mg/dL (L)).  Liver Function Tests: Recent Labs  Lab 01/11/23 1425 01/12/23 1128  AST 26 20  ALT 20 17  ALKPHOS 177* 150*  BILITOT 0.8 1.1  PROT 6.2* 5.6*  ALBUMIN 2.5* 2.3*    CBG: No results for input(s): "GLUCAP" in the last 168 hours.   Recent Results (from the past 240 hour(s))  Urine Culture     Status: Abnormal  Collection Time: 01/06/23  3:58 PM   Specimen: Urine, Random  Result Value Ref Range Status   Specimen Description   Final    URINE, RANDOM Performed at [facility], [address], [place], [place] [address]    Special Requests   Final    NONE Reflexed from [id number] Performed at [facility], [address], [place], [place] [address]    Culture >=100,000 COLONIES/mL ENTEROBACTER CLOACAE (A)  Final   Report Status 01/09/2023 FINAL  Final   Organism ID, Bacteria ENTEROBACTER CLOACAE (A)  Final      Susceptibility   Enterobacter cloacae - MIC*    CEFEPIME <=0.12 SENSITIVE Sensitive     CIPROFLOXACIN <=0.25 SENSITIVE Sensitive     GENTAMICIN <=1 SENSITIVE Sensitive     IMIPENEM 0.5 SENSITIVE Sensitive     NITROFURANTOIN 64 INTERMEDIATE Intermediate     TRIMETH/SULFA <=20 SENSITIVE Sensitive      PIP/TAZO <=4 SENSITIVE Sensitive ug/mL    * >=100,000 COLONIES/mL ENTEROBACTER CLOACAE         Radiology Studies: No results found.      Scheduled Meds:  bisacodyl  10 mg Rectal Daily   Chlorhexidine Gluconate Cloth  6 each Topical Daily   DULoxetine  60 mg Oral Daily   enoxaparin (LOVENOX) injection  40 mg Subcutaneous QHS   feeding supplement  237 mL Oral BID BM   fentaNYL  1 patch Transdermal Q72H   gabapentin  300 mg Oral QID   [START ON 01/16/2023] methIMAzole  5 mg Oral Q M,W,F   pantoprazole  40 mg Oral Daily   polyethylene glycol  17 g Oral BID   pregabalin  100 mg Oral TID   senna-docusate  2 tablet Oral BID   sodium chloride flush  10-40 mL Intracatheter Q12H   SMOG  960 mL Rectal Once   traZODone  100 mg Oral QHS   Continuous Infusions:   LOS: 4 days    Time spent: 35 minutes    Ramiro Harvest, MD [facility]   To contact the attending provider between 7A-7P or the covering provider during after hours 7P-7A, please log into the web site [email or web address] and access using universal [place] password for that web site. If you do not have the password, please call the hospital operator.  01/15/2023, 12:51 PM

## 2023-01-15 NOTE — NC FL2 (Signed)
[place] MEDICAID FL2 LEVEL OF CARE FORM     IDENTIFICATION  Patient Name: Alexandria Mclaughlin Birthdate: 1940/05/10 Sex: female Admission Date (Current Location): 01/11/2023  [facility] and [place] Number:  Producer, television/film/video and Address:  [facility],  [address] [place], [place] [address]      Provider Number: [id number]  Attending Physician Name and Address:  Rodolph Bong, MD  Relative Name and Phone Number:  Sabino Dick [phone] Christopher,Elizabeth Daughter [phone]    Current Level of Care: Hospital Recommended Level of Care: Skilled Nursing Facility Prior Approval Number:    Date Approved/Denied:   PASRR Number: [id number]  Discharge Plan: SNF    Current Diagnoses: Patient Active Problem List   Diagnosis Date Noted   Hypokalemia 01/14/2023   Hypomagnesemia 01/14/2023   Palliative care encounter 01/13/2023   Partial small bowel obstruction (HCC) 01/12/2023   History of recent fall 01/11/2023   Chronic narcotic use 01/11/2023   Anemia of chronic disease 01/11/2023   IDA (iron deficiency anemia) 01/11/2023   Intraabdominal fluid collection 01/11/2023   Cancer-related breakthrough pain 12/21/2022   Need for emotional support 12/21/2022   Counseling and coordination of care 12/21/2022   Cancer associated pain 12/20/2022   High risk medication use 12/20/2022   Anxiety 12/20/2022   Constipation 12/20/2022   Medication management 12/20/2022   Palliative care by specialist 12/20/2022   Goals of care, counseling/discussion 10/17/2022   Anemia due to antineoplastic chemotherapy 08/29/2022   Genetic testing 07/07/2022   Pancytopenia, acquired (HCC) 06/27/2022   COVID-19 virus infection 06/09/2022   Drug-induced hyperglycemia 05/30/2022   Ovarian cancer (HCC) 04/08/2022   Elevated tumor markers 04/08/2022   Peritoneal carcinomatosis (HCC) 04/08/2022   Mild cognitive impairment of uncertain or unknown etiology  01/24/2022   Multiple lacunar infarcts    Generalized anxiety disorder    Major depressive disorder 01/23/2022   GERD (gastroesophageal reflux disease) 01/23/2022   Right leg weakness    Acquired hallux rigidus of left foot 12/17/2020   Rotator cuff tendonitis, left 06/08/2018   Lumbar radiculopathy 04/26/2018   Lumbar radicular pain 07/29/2016   Myoclonus 09/19/2015   Therapeutic opioid induced constipation 07/25/2015   Essential hypertension 02/07/2015   HLD (hyperlipidemia) 02/07/2015   Low back pain 02/07/2015   BPPV (benign paroxysmal positional vertigo) 02/07/2015   Abnormality of gait 02/07/2015   Cerebrovascular disease    Lumbar facet arthropathy 07/05/2014   Acquired unequal leg length on left 08/08/2011   Lumbar post-laminectomy syndrome 06/11/2011   Sacroiliac joint dysfunction 06/11/2011   Subcortical infarction     Orientation RESPIRATION BLADDER Height & Weight     Self, Time, Situation, Place  Normal Continent Weight: 153 lb 15.5 oz (69.8 kg) Height:  5\' 3"  (160 cm)  BEHAVIORAL SYMPTOMS/MOOD NEUROLOGICAL BOWEL NUTRITION STATUS      Incontinent Diet (see d/c summary)  AMBULATORY STATUS COMMUNICATION OF NEEDS Skin   Extensive Assist Verbally Normal                       Personal Care Assistance Level of Assistance  Bathing, Feeding, Dressing Bathing Assistance: Limited assistance Feeding assistance: Independent Dressing Assistance: Limited assistance     Functional Limitations Info  Sight, Hearing, Speech Sight Info: Adequate Hearing Info: Adequate Speech Info: Adequate    SPECIAL CARE FACTORS FREQUENCY  PT (By licensed PT), OT (By licensed OT)     PT Frequency: 5x/week OT Frequency: 5x/week  Contractures Contractures Info: Not present    Additional Factors Info  Code Status, Allergies, Psychotropic Code Status Info: DNR Allergies Info: no known allergies Psychotropic Info: DULoxetine (CYMBALTA) DR capsule 60 mg          Current Medications (01/15/2023):  This is the current hospital active medication list Current Facility-Administered Medications  Medication Dose Route Frequency Provider Last Rate Last Admin   acetaminophen (TYLENOL) tablet 650 mg  650 mg Oral Q6H PRN Steffanie Rainwater, MD   650 mg at 01/14/23 2226   Or   acetaminophen (TYLENOL) suppository 650 mg  650 mg Rectal Q6H PRN Steffanie Rainwater, MD       alum & mag hydroxide-simeth (MAALOX/MYLANTA) 200-200-20 MG/5ML suspension 30 mL  30 mL Oral Q4H PRN Rolly Salter, MD   30 mL at 01/14/23 0556   bisacodyl (DULCOLAX) suppository 10 mg  10 mg Rectal Daily Meuth, Brooke A, PA-C   10 mg at 01/15/23 0865   Chlorhexidine Gluconate Cloth 2 % PADS 6 each  6 each Topical Daily Steffanie Rainwater, MD   6 each at 01/15/23 0805   clonazePAM (KLONOPIN) tablet 0.5 mg  0.5 mg Oral QHS PRN Steffanie Rainwater, MD   0.5 mg at 01/12/23 0057   DULoxetine (CYMBALTA) DR capsule 60 mg  60 mg Oral Daily Steffanie Rainwater, MD   60 mg at 01/15/23 0804   enoxaparin (LOVENOX) injection 40 mg  40 mg Subcutaneous QHS Rodolph Bong, MD   40 mg at 01/14/23 2225   feeding supplement (ENSURE ENLIVE / ENSURE PLUS) liquid 237 mL  237 mL Oral BID BM Meuth, Brooke A, PA-C   237 mL at 01/14/23 1041   fentaNYL (DURAGESIC) 50 MCG/HR 1 patch  1 patch Transdermal Q72H Steffanie Rainwater, MD   1 patch at 01/14/23 2132   gabapentin (NEURONTIN) capsule 300 mg  300 mg Oral QID Alvester Morin W, DO   300 mg at 01/15/23 0804   HYDROmorphone (DILAUDID) injection 0.5 mg  0.5 mg Intravenous Q4H PRN Rolly Salter, MD       melatonin tablet 10 mg  10 mg Oral QHS PRN Steffanie Rainwater, MD   10 mg at 01/11/23 2145   ondansetron (ZOFRAN) tablet 4 mg  4 mg Oral Q6H PRN Steffanie Rainwater, MD       Or   ondansetron [facility]) injection 4 mg  4 mg Intravenous Q6H PRN Steffanie Rainwater, MD   4 mg at 01/13/23 1814   oxyCODONE (Oxy IR/ROXICODONE) immediate release tablet 10 mg  10 mg Oral  Q4H PRN Rolly Salter, MD   10 mg at 01/14/23 0907   pantoprazole (PROTONIX) EC tablet 40 mg  40 mg Oral Daily Steffanie Rainwater, MD   40 mg at 01/15/23 0804   polyethylene glycol (MIRALAX / GLYCOLAX) packet 17 g  17 g Oral BID Rodolph Bong, MD   17 g at 01/15/23 0803   pregabalin (LYRICA) capsule 100 mg  100 mg Oral TID Alena Bills, DO   100 mg at 01/15/23 0804   prochlorperazine (COMPAZINE) injection 10 mg  10 mg Intravenous Q6H PRN Rolly Salter, MD   10 mg at 01/12/23 1457   senna-docusate (Senokot-S) tablet 2 tablet  2 tablet Oral BID Mims, Lauren W, DO       sodium chloride flush (NS) 0.9 % injection 10-40 mL  10-40 mL Intracatheter Q12H [place], Flossie Buffy, MD   10  mL at 01/15/23 0806   sodium chloride flush (NS) 0.9 % injection 10-40 mL  10-40 mL Intracatheter PRN Steffanie Rainwater, MD       sorbitol, magnesium hydroxide, mineral oil, glycerin (SMOG) enema  960 mL Rectal Once Rodolph Bong, MD       traZODone (DESYREL) tablet 100 mg  100 mg Oral QHS Steffanie Rainwater, MD   100 mg at 01/14/23 2140     Discharge Medications: Please see discharge summary for a list of discharge medications.  Relevant Imaging Results:  Relevant Lab Results:   Additional Information SS#: [id number]  Erin Sons, LCSW

## 2023-01-15 NOTE — Progress Notes (Signed)
Mobility Specialist - Progress Note   01/15/23 0959  Mobility  Activity Transferred to/from [facility];Transferred from bed to chair  Level of Assistance Standby assist, set-up cues, supervision of patient - no hands on  Assistive Device Front wheel walker  Distance Ambulated (ft) 2 ft  Activity Response Tolerated well  Mobility Referral Yes  $Mobility charge 1 Mobility  Mobility Specialist Start Time (ACUTE ONLY) 0941  Mobility Specialist Stop Time (ACUTE ONLY) 0954  Mobility Specialist Time Calculation (min) (ACUTE ONLY) 13 min   Pt received in bed and agreeable to mobility. Prior to ambulating, pt requested assistance to [facility]. Pt was minA from supine>sitting & STS. After using BSC, pt grew fatigued requiring a seated rest break EOB. Assisted pt to recliner afterwards. No complaints during session. Pt to recliner after session with all needs met. Family in room.   [facility]

## 2023-01-15 NOTE — TOC Initial Note (Signed)
Transition of Care [facility]) - Initial/Assessment Note    Patient Details  Name: Alexandria Mclaughlin MRN: [id number] Date of Birth: 09-15-1940  [facility]) CM/SW Contact:    Erin Sons, LCSW Phone Number: 01/15/2023, 10:16 AM  Clinical Narrative:                  Pt was recently assessed by TOC during recent admission on 12/19/22. Pt lives at home with her son. She Dc'd home on 12/26/22 with son and [facility] services arranged with [facility]. CSW to follow up with [facility] to confirm if she is still active with them. PMT is following. Disposition tbd. TOC will continue to follow to assist with potential DC needs.    Barriers to Discharge: Continued Medical Work up                    Prior Living Arrangements/Services      Home with son            Activities of Daily Living   ADL Screening (condition at time of admission) Independently performs ADLs?: No Does the patient have a NEW difficulty with bathing/dressing/toileting/self-feeding that is expected to last >3 days?: No Does the patient have a NEW difficulty with getting in/out of bed, walking, or climbing stairs that is expected to last >3 days?: No Does the patient have a NEW difficulty with communication that is expected to last >3 days?: No Is the patient deaf or have difficulty hearing?: Yes Does the patient have difficulty seeing, even when wearing glasses/contacts?: No Does the patient have difficulty concentrating, remembering, or making decisions?: No  Permission Sought/Granted                  Emotional Assessment              Admission diagnosis:  Partial small bowel obstruction (HCC) [K56.600] Intraabdominal fluid collection [R18.8] Patient Active Problem List   Diagnosis Date Noted   Hypokalemia 01/14/2023   Hypomagnesemia 01/14/2023   Palliative care encounter 01/13/2023   Partial small bowel obstruction (HCC) 01/12/2023   History of recent fall 01/11/2023   Chronic narcotic use 01/11/2023    Anemia of chronic disease 01/11/2023   IDA (iron deficiency anemia) 01/11/2023   Intraabdominal fluid collection 01/11/2023   Cancer-related breakthrough pain 12/21/2022   Need for emotional support 12/21/2022   Counseling and coordination of care 12/21/2022   Cancer associated pain 12/20/2022   High risk medication use 12/20/2022   Anxiety 12/20/2022   Constipation 12/20/2022   Medication management 12/20/2022   Palliative care by specialist 12/20/2022   Goals of care, counseling/discussion 10/17/2022   Anemia due to antineoplastic chemotherapy 08/29/2022   Genetic testing 07/07/2022   Pancytopenia, acquired (HCC) 06/27/2022   COVID-19 virus infection 06/09/2022   Drug-induced hyperglycemia 05/30/2022   Ovarian cancer (HCC) 04/08/2022   Elevated tumor markers 04/08/2022   Peritoneal carcinomatosis (HCC) 04/08/2022   Mild cognitive impairment of uncertain or unknown etiology 01/24/2022   Multiple lacunar infarcts    Generalized anxiety disorder    Major depressive disorder 01/23/2022   GERD (gastroesophageal reflux disease) 01/23/2022   Right leg weakness    Acquired hallux rigidus of left foot 12/17/2020   Rotator cuff tendonitis, left 06/08/2018   Lumbar radiculopathy 04/26/2018   Lumbar radicular pain 07/29/2016   Myoclonus 09/19/2015   Therapeutic opioid induced constipation 07/25/2015   Essential hypertension 02/07/2015   HLD (hyperlipidemia) 02/07/2015   Low back pain 02/07/2015  BPPV (benign paroxysmal positional vertigo) 02/07/2015   Abnormality of gait 02/07/2015   Cerebrovascular disease    Lumbar facet arthropathy 07/05/2014   Acquired unequal leg length on left 08/08/2011   Lumbar post-laminectomy syndrome 06/11/2011   Sacroiliac joint dysfunction 06/11/2011   Subcortical infarction    PCP:  Patient, No Pcp Per Pharmacy:   [id number] [place], [place] - [address] [place] [address] Phone: [phone] Fax:  [phone]  [facility] - [facility] [address] [place] [address] Phone: [phone] Fax: [phone]  [facility] [address], [place] - [address] [place] [address] Phone: [phone] Fax: [phone]     Social Determinants of Health (SDOH) Social History: SDOH Screenings   Food Insecurity: No Food Insecurity (01/13/2023)  Housing: Patient Declined (01/13/2023)  Transportation Needs: No Transportation Needs (01/13/2023)  Utilities: Not At Risk (01/13/2023)  Depression (PHQ2-9): Low Risk  (01/07/2023)  Social Connections: Unknown (07/04/2021)   Received from [facility]  Stress: Stress Concern Present (12/30/2022)  Tobacco Use: Medium Risk (01/11/2023)   SDOH Interventions:     Readmission Risk Interventions     No data to display

## 2023-01-15 NOTE — TOC Progression Note (Signed)
Transition of Care [facility]) - Progression Note    Patient Details  Name: Alexandria Mclaughlin MRN: [id number] Date of Birth: 03-01-1940  Transition of Care [facility]) CM/SW Contact  Erin Sons, [place] Phone Number: 01/15/2023, 2:27 PM  Clinical Narrative:     CSW informed by PMT that pt and family want to pursue SNF for rehab and then transition to LTC under private pay with Hospice.   CSW met with pt and pt's daughter bedside. They confirmed this plan. They are hoping they can get offers from SNF's with higher star ratings than last time. Family would be willing to consider private paying at facilities that are not in network with pt's [employer] medicare if they are not satisfied with SNF's that offer beds for STR under pt's [employer].   Fl2 completed and bed requests sent in hub.   Expected Discharge Plan: Skilled Nursing Facility Barriers to Discharge: Continued Medical Work up, English as a second language teacher, SNF Pending bed offer                  Social Determinants of Health (SDOH) Interventions SDOH Screenings   Food Insecurity: No Food Insecurity (01/13/2023)  Housing: Patient Declined (01/13/2023)  Transportation Needs: No Transportation Needs (01/13/2023)  Utilities: Not At Risk (01/13/2023)  Depression (PHQ2-9): Low Risk  (01/07/2023)  Social Connections: Unknown (07/04/2021)   Received from [facility]  Stress: Stress Concern Present (12/30/2022)  Tobacco Use: Medium Risk (01/11/2023)    Readmission Risk Interventions     No data to display

## 2023-01-15 NOTE — Consult Note (Signed)
Value-Based [facility] Institute [facility] Liaison Consult Note    01/15/2023  Alexandria Mclaughlin 02/13/1941 [id number]  [facility] Institute [VBCI] remote coverage review for patient admitted to [facility] Patient:  Active in [facility] team noted extreme high risk  Primary Care Provider:  Patient, No Pcp Per patient is followed by Oncology team noted   Insurance: [employer] Medicare  Patient is currently active with [facility] for care coordination services.  Patient has been engaged by a [employer].  The community based plan of care has focused on disease management and community resource support.    Plan: Continue to follow for any additional community care coordination needs for post hospital/community needs. Disposition is pending for possible SNF verses returning to home w/ HH. If SNF is needed will need insurance authorization).    Of note, [facility] services does not replace or interfere with any services that are needed or arranged by inpatient [facility] care management team.   Charlesetta Shanks, RN, BSN, CCM [place]  [facility], [facility] Liaison Direct Dial: [phone] or secure chat Email: [email or web address]

## 2023-01-15 NOTE — Progress Notes (Signed)
Daily Progress Note   Patient Name: Alexandria Mclaughlin       Date: 01/15/2023 DOB: 1940/10/02  Age: 82 y.o. MRN#: [id number] Attending Physician: Rodolph Bong, MD Primary Care Physician: Patient, No Pcp Per Admit Date: 01/11/2023 Length of Stay: 4 days  Reason for Consultation/Follow-up: Establishing goals of care  Subjective:   CC: Patient wanting good oral bowel regimen so does not require enemas. Following up regarding complex medical decision making.  Subjective:  Reviewed EMR prior to presenting to bedside.  Had 2 bowel movements overnight after enema.  Presented to bedside initially in the morning.  Patient seen sitting up in bedside chair.  No family present at bedside.  Patient noted that her daughter should be there in about an hour.  Noted would be happy to return at that time to discuss as a family.  Patient voiced appreciation for this.  Did follow-up regarding patient's symptom burden.  Patient notes that she has had bowel movements after enema.  Discussed we can adjust her oral regimen to give her more robust oral medications so that she does not always require an enema for bowel movements.  Patient agreeing with this plan.  Presented to bedside later in morning once informed by RN that patient's daughter at bedside.  Presented to bedside and introduced myself as a member of the palliative medicine team to patient's daughter, Alexandria Mclaughlin.  Spent time reviewing patient's medical illness.  Patient has heard that her cancer is "terminal".  Discussed that based on conversations with oncologist, there is concern that further cancer directed therapies will cause more harm than benefit so these will not be offered.  Patient and daughter acknowledged this.   Ways for possible medical care moving forward.  Patient states that she does want to remain as independent as able.  Patient would like to pursue going to rehab to regain strength.  Noted would have PT eval and then could seek rehab from  there if appropriate. Discussed that after rehab, needed to determine next steps in care.  With permission, able to discuss the idea of hospice support and what this would provide knowing that patient is not a candidate for further cancer directed therapies and has underlying known metastatic malignancy.  Discussed different options for hospice support including hospice support at home versus paying out-of-pocket for room and board at a long-term care facility with hospice layered in.  Patient and daughter planning to discuss this along with patient's son, Alexandria Maduro Nadine Mclaughlin), to plan moving forward.  Noted would involve TOC to assist with coordination of care as well.  Spent time answering questions as able.  Noted palliative medicine team and continue to follow with patient's medical journey.  Updated IDT after visit including hospitalist, RN, TOC, and oncologist.  Objective:   Vital Signs:  BP 135/77 (BP Location: Left Arm)   Pulse 70   Temp (!) 97.5 F (36.4 C) (Oral)   Resp 16   Ht 5\' 3"  (1.6 m)   Wt 69.8 kg   SpO2 93%   BMI 27.27 kg/m   Physical Exam: General: NAD, awake, chronically ill appearing Cardiovascular: RRR Respiratory: no increased work of breathing noted, not in respiratory distress Neuro: awake, interactive   Imaging: I personally reviewed recent imaging.   Assessment & Plan:   Assessment: Patient is an 82 year old female with a past medical history of high-grade ovarian cancer on chemotherapy, EP PVD, CVA, CAD, GERD, hyperlipidemia, and hypothyroidism who was admitted on 01/11/2023 for management of nausea, vomiting,  and abdominal pain.  Upon admission, imaging showed evidence of SBO as well as new subcapsular perihepatic fluid collection.  Gyn Onc, general surgery, and oncology consulted for evaluation.  Patient deemed not to be a surgical candidate and recommended supportive care.  Palliative medicine team consulted to assist with complex medical decision making.    Recommendations/Plan: # Complex medical decision making/goals of care:   -Discussed care with patient while daughter, Alexandria Mclaughlin, present today at bedside.  Patient wants to seek rehab to regain strength is much as possible as this is quality of life to her.  Discussed seeking enrollment in hospice after rehab.  Patient to determine if would like home with hospice versus paying out-of-pocket for long-term care with hospice support.  Family going to assist in coordination of care.  Noted would involve TOC to assist as well.   -Placed PT evaluation request.                -  Code Status: Limited: Do not attempt resuscitation (DNR) -DNR-LIMITED -Do Not Intubate/DNI   Prognosis: weeks-short months    # Symptom management                -Pain, acute on chronic in setting of high-grade ovarian cancer Discussed with outpatient chronic pain provider, Dr. Riley Kill, on 11/19. Will restart outpatient regimen.                                -Continue duloxetine 60 mg daily                               -Continue fentanyl patch 50 mcg/h every 72 hours                               -Continue gabapentin to 300 mg 4 times daily                               -Continue pregabalin 100 mg 3 times daily                               -Continue IV Dilaudid 0.5 mg every 6 hours as needed                  -Constipation                               -Increase senna-S to 2 tabs twice daily as per surgery                               -Continue MiraLAX 17 g BID   # Psycho-social/Spiritual Support:  - Support System: son, daughter   # Discharge Planning:  To Be Determined  Discussed with: patient, oncology, hospitalist, RN, patient' daughter   Thank you for allowing the palliative care team to participate in the care Alexandria Mclaughlin.  Alvester Morin, DO Palliative Care Provider PMT # [phone]  If patient remains symptomatic despite maximum doses, please call PMT at [phone] between 0700 and 1900. Outside of these  hours, please call attending, as PMT does not have night coverage.  Personally spent 52 minutes in patient care including extensive chart review (labs, imaging, progress/consult notes, vital signs), medically appropraite exam, discussed with treatment team, education to patient, family, and staff, documenting clinical information, medication review and management, coordination of care, and available advanced directive documents.   *Please note that this is a verbal dictation therefore any spelling or grammatical errors are due to the "[employer] One" system interpretation.

## 2023-01-16 ENCOUNTER — Other Ambulatory Visit: Payer: Self-pay | Admitting: Hematology and Oncology

## 2023-01-16 ENCOUNTER — Inpatient Hospital Stay (HOSPITAL_COMMUNITY): Payer: Medicare HMO

## 2023-01-16 DIAGNOSIS — D509 Iron deficiency anemia, unspecified: Secondary | ICD-10-CM | POA: Diagnosis not present

## 2023-01-16 DIAGNOSIS — C786 Secondary malignant neoplasm of retroperitoneum and peritoneum: Secondary | ICD-10-CM | POA: Diagnosis not present

## 2023-01-16 DIAGNOSIS — Z711 Person with feared health complaint in whom no diagnosis is made: Secondary | ICD-10-CM | POA: Diagnosis not present

## 2023-01-16 DIAGNOSIS — K56600 Partial intestinal obstruction, unspecified as to cause: Secondary | ICD-10-CM | POA: Diagnosis not present

## 2023-01-16 DIAGNOSIS — C561 Malignant neoplasm of right ovary: Secondary | ICD-10-CM | POA: Diagnosis not present

## 2023-01-16 DIAGNOSIS — J690 Pneumonitis due to inhalation of food and vomit: Secondary | ICD-10-CM | POA: Diagnosis not present

## 2023-01-16 DIAGNOSIS — Z79899 Other long term (current) drug therapy: Secondary | ICD-10-CM

## 2023-01-16 DIAGNOSIS — R188 Other ascites: Secondary | ICD-10-CM | POA: Diagnosis not present

## 2023-01-16 DIAGNOSIS — Z515 Encounter for palliative care: Secondary | ICD-10-CM | POA: Diagnosis not present

## 2023-01-16 LAB — BASIC METABOLIC PANEL
Anion gap: 12 (ref 5–15)
BUN: 20 mg/dL (ref 8–23)
CO2: 25 mmol/L (ref 22–32)
Calcium: 9 mg/dL (ref 8.9–10.3)
Chloride: 98 mmol/L (ref 98–111)
Creatinine, Ser: 0.69 mg/dL (ref 0.44–1.00)
GFR, Estimated: >60 mL/min (ref >60)
Glucose, Bld: 152 mg/dL — ABNORMAL HIGH (ref 70–99)
Potassium: 4.1 mmol/L (ref 3.5–5.1)
Sodium: 135 mmol/L (ref 135–145)

## 2023-01-16 LAB — BLOOD GAS, VENOUS
Acid-Base Excess: 4.1 mmol/L — ABNORMAL HIGH (ref 0.0–2.0)
Bicarbonate: 28.5 mmol/L — ABNORMAL HIGH (ref 20.0–28.0)
O2 Saturation: 66.9 %
Patient temperature: 37
pCO2, Ven: 41 mm[Hg] — ABNORMAL LOW (ref 44–60)
pH, Ven: 7.45 — ABNORMAL HIGH (ref 7.25–7.43)
pO2, Ven: 39 mm[Hg] (ref 32–45)

## 2023-01-16 LAB — CBC
HCT: 34.5 % — ABNORMAL LOW (ref 36.0–46.0)
Hemoglobin: 10.9 g/dL — ABNORMAL LOW (ref 12.0–15.0)
MCH: 30.1 pg (ref 26.0–34.0)
MCHC: 31.6 g/dL (ref 30.0–36.0)
MCV: 95.3 fL (ref 80.0–100.0)
Platelets: 206 10*3/uL (ref 150–400)
RBC: 3.62 MIL/uL — ABNORMAL LOW (ref 3.87–5.11)
RDW: 15.4 % (ref 11.5–15.5)
WBC: 8.6 10*3/uL (ref 4.0–10.5)
nRBC: 0 % (ref 0.0–0.2)

## 2023-01-16 LAB — MAGNESIUM: Magnesium: 1.8 mg/dL (ref 1.7–2.4)

## 2023-01-16 MED ORDER — BIOTENE DRY MOUTH MT LIQD: 15.0000 mL | OROMUCOSAL | Status: DC | PRN

## 2023-01-16 MED ORDER — SODIUM CHLORIDE 0.9 % IV BOLUS
500.0000 mL | Freq: Once | INTRAVENOUS | Status: AC
  Administered 2023-01-16: 500 mL via INTRAVENOUS

## 2023-01-16 MED ORDER — SODIUM CHLORIDE 0.9 % IV SOLN
3.0000 g | Freq: Four times a day (QID) | INTRAVENOUS | Status: DC
  Administered 2023-01-16: 3 g via INTRAVENOUS
  Filled 2023-01-16 (×2): qty 8

## 2023-01-16 MED ORDER — HYDROMORPHONE HCL 1 MG/ML IJ SOLN: 0.5000 mg | INTRAMUSCULAR | Status: DC | PRN

## 2023-01-16 MED ORDER — LORAZEPAM 2 MG/ML IJ SOLN
0.5000 mg | INTRAMUSCULAR | Status: DC | PRN
  Administered 2023-01-16 – 2023-01-17 (×2): 0.5 mg via INTRAVENOUS
  Filled 2023-01-16 (×2): qty 1

## 2023-01-16 MED ORDER — MAGNESIUM SULFATE 2 GM/50ML IV SOLN
2.0000 g | Freq: Once | INTRAVENOUS | Status: AC
  Administered 2023-01-16: 2 g via INTRAVENOUS
  Filled 2023-01-16: qty 50

## 2023-01-16 MED ORDER — SODIUM CHLORIDE 0.9 % IV SOLN: INTRAVENOUS | Status: DC

## 2023-01-16 MED ORDER — HYDROMORPHONE HCL 1 MG/ML IJ SOLN: 1.0000 mg | INTRAMUSCULAR | Status: DC | PRN

## 2023-01-16 MED ORDER — OXYCODONE HCL 5 MG PO TABS
10.0000 mg | ORAL_TABLET | ORAL | Status: DC | PRN
  Administered 2023-01-16 – 2023-01-17 (×3): 10 mg via ORAL
  Filled 2023-01-16 (×3): qty 2

## 2023-01-16 MED ORDER — HALOPERIDOL LACTATE 5 MG/ML IJ SOLN: 0.5000 mg | INTRAMUSCULAR | Status: DC | PRN

## 2023-01-16 MED ORDER — GLYCOPYRROLATE 0.2 MG/ML IJ SOLN: 0.2000 mg | INTRAMUSCULAR | Status: DC | PRN

## 2023-01-16 MED ORDER — PANTOPRAZOLE SODIUM 40 MG IV SOLR
40.0000 mg | Freq: Every day | INTRAVENOUS | Status: DC
  Administered 2023-01-16: 40 mg via INTRAVENOUS
  Filled 2023-01-16: qty 10

## 2023-01-16 MED ORDER — BISACODYL 10 MG RE SUPP: 10.0000 mg | Freq: Every day | RECTAL | Status: DC | PRN

## 2023-01-16 MED ORDER — ALUM & MAG HYDROXIDE-SIMETH 200-200-20 MG/5ML PO SUSP
30.0000 mL | Freq: Four times a day (QID) | ORAL | Status: DC | PRN
  Filled 2023-01-16: qty 30

## 2023-01-16 MED ORDER — PREGABALIN 75 MG PO CAPS
75.0000 mg | ORAL_CAPSULE | Freq: Three times a day (TID) | ORAL | Status: DC
  Filled 2023-01-16: qty 1

## 2023-01-16 MED ORDER — TRAZODONE HCL 50 MG PO TABS: 25.0000 mg | ORAL_TABLET | Freq: Every day | ORAL | Status: DC

## 2023-01-16 MED ORDER — POLYVINYL ALCOHOL 1.4 % OP SOLN: 1.0000 [drp] | Freq: Four times a day (QID) | OPHTHALMIC | Status: DC | PRN

## 2023-01-16 MED ORDER — TRAZODONE HCL 50 MG PO TABS: 50.0000 mg | ORAL_TABLET | Freq: Every day | ORAL | Status: DC

## 2023-01-16 NOTE — Progress Notes (Signed)
PT Cancellation Note  Patient Details Name: Alexandria Mclaughlin MRN: [id number] DOB: [birth year]   Cancelled Treatment:    Reason Eval/Treat Not Completed: Medical issues which prohibited therapy. Noted rapid response in chart review. Per nursing, pt lethargic, requesting therapy hold PT eval until tomorrow,     Domenick Bookbinder PT, DPT 01/16/23, 8:53 AM

## 2023-01-16 NOTE — Progress Notes (Unsigned)
Called [facility] at [phone] and spoke with office staff regarding orders faxed to office for home health. Per Dr. Bertis Ruddy instructed to fax orders to PCP, Dr. Nicholos Johns. Dr. Bertis Ruddy will not be signing orders.

## 2023-01-16 NOTE — Progress Notes (Signed)
While doing rounds, I found patient had vomited on her self and I cleaned her up and then administered IV Zofran. Patient seemed less responsive than earlier in the shift so I checked her VS. Patient was a red mews, HR was in the 130's and O2 was in the 70's, I put patient on 5 liters of O2. Red MEWS protocol was initiated. Rapid Response RN- Huntley Dec was called so she could come and evaluate patient. NP Virgel Manifold was notified of patient's VS.

## 2023-01-16 NOTE — Significant Event (Signed)
Rapid Response Event Note   Reason for Call : AMS, hypoxia, tachycardia, and hypotension s/p finding patient had vomited on herself during rounds.    Initial Focused Assessment:  Patient is pale, lethargic, A&Ox2, intermittently answers questions, has to be prompted multiple times, delayed responses. Lung sounds clear, slightly diminished. No belly pain, complains of nausea. PERRL intact. Patient placed on 5L [place] d/t O2 of 70% RA. Virgel Manifold, NP also presented to bedside. Patient noted to have been given multiple sedative medications at bedtime; discussed with NP and in agreement that medications could be attributing to lethargy.   Interventions:  VBG, labs, supplemental O2, CXR, KUB, EKG, 500 mL NS bolus, made NPO  Plan of Care:  continuous pulse ox monitoring, new antiemetic added, bolus and maintenance gtt of NS, and an EKG. Patient is a DNR/DNI with requests not to be transferred to ICU unless absolutely necessary. Does not need to be transferred at this time, will continue supportive care.    Event Summary:   MD Notified: A. Virgel Manifold, NP Call Time: [phone] Arrival Time: 0325 End Time: 0400  Sudie Grumbling, RN

## 2023-01-16 NOTE — Progress Notes (Addendum)
    Patient Name: Alexandria Mclaughlin           DOB: [birth year]  MRN: [id number]      Admission Date: 01/11/2023  Attending Provider: Rodolph Bong, MD  Primary Diagnosis: Intraabdominal fluid collection   Level of care: Med-Surg    CROSS COVER NOTE   Date of Service   01/16/2023   Alexandria Mclaughlin, 82 y.o. female, was admitted on 01/11/2023 for Intraabdominal fluid collection.    HPI/Events of Note   Rapid response called by bedside RN. RN concerned with change in vital signs -- HR 130, SpO2 70% on room air. AMS also reported.    Bedside Assessment:  Patient is drowsy, but responds to verbal stimulation. She is A/O x2. Denies pain or discomfort.  Earlier tonight, she received Lyrica, trazodone, Klonopin, melatonin, oxycodone, Compazine. These meds could be contributing to her drowsy state.  RN also reports patient had 2 episodes of emesis, described as "undigested food."  She was administered Zofran and noted to be less responsive than earlier.  Vital signs were checked, and she was found to be hypoxic, SpO2 70% on room air.  Currently on 5 L nasal cannula satting 96%.   Patient may have aspirated as RN also mentions that pt was found with vomit content around her mouth and gown.  Prior abdomen imaging concerning for possible SBO versus severe constipation.   Will obtain chest x-[place], VBG, EKG, KUB.   Respiratory: Bilaterally clear. Normal effort. No accessory muscle use.  Not in respiratory distress. Cardiovascular: Regular rhythm. No extremity edema. 2+ pedal pulses.  Abdomen: Abdomen is soft and nontender.  Nondistended. + Flatulence.  Positive bowel sounds in all quadrants. No rebound tenderness or guarding.    Addendum: 0510-HR improved after antiemetic and fluid bolus.  HR 100s and now on 2 L [place]. Nursing staff to continue monitoring, reorienting patient.   Interventions/ Plan   Chest x-[place]-low lung volume with mild airspace disease at the lung bases, greater on the right than  left. KUB-distended loops of small bowel in the abdomen measuring up to 3.4 cm, ileus versus obstruction. Consider NGT placement for persistent emesis.  VBG  Fluid bolus, 500 cc EKG--> Sinus tachycardia (HR 118), without ST seg change Continuous pulse ox O2 as needed, RN has been asked to titrate down. N.p.o. Unable to reach family for update, last attempt was made at 0608.        Anthoney Harada, DNP, ACNPC- [facility]

## 2023-01-16 NOTE — Progress Notes (Signed)
PROGRESS NOTE    Alexandria Mclaughlin  ION:[id number] DOB: 09-01-1940 DOA: 01/11/2023 PCP: Patient, No Pcp Per    No chief complaint on file.   Brief Narrative:  PMH of metastatic high-grade ovarian cancer on chemotherapy, BPPV, CVA, CAD, GERD, HLD, hypothyroidism.  Presented to the hospital with complaints of nausea and vomiting as well as abdominal pain. Workup in the ED showed evidence of SBO as well as new subcapsular perihepatic fluid collection.  Radiation GYN oncology was consulted. General surgery was consulted as well. Currently plan is conservative management.   Assessment & Plan:   Principal Problem:   Intraabdominal fluid collection Active Problems:   Aspiration pneumonia (HCC)   Ovarian cancer (HCC)   Peritoneal carcinomatosis (HCC)   Cancer associated pain   History of recent fall   Chronic narcotic use   Anemia of chronic disease   IDA (iron deficiency anemia)   Partial small bowel obstruction (HCC)   Palliative care encounter   Hypokalemia   Hypomagnesemia   DNR (do not resuscitate)   Concern about end of life  #1 possible SBO versus severe constipation -Patient presenting with complaints of nausea vomiting abdominal pain. -Patient with high-grade ovarian cancer which may also be contributing to patient's symptoms. -Patient noted to have passing flatus, stated had a small bowel movement after bowel regimen and fleets enema yesterday and requesting another enema. -Patient seen in consultation by general surgery recommending conservative treatment for now and diet was advanced to a clear liquid diet which she seems to be tolerating. -Diet advanced to a full liquid diet which patient tolerated and patient requesting diet to be further advanced on 01/15/2023 -Diet advanced to a soft diet. -Patient noted to have deteriorated overnight with some emesis and concern for aspiration. -Abdominal films done concerning for ileus versus obstruction. -Patient reassessed by  palliative care who discussed with family and decision made to transition to comfort measures. -Patient likely to residential hospice home when bed available..  2.  Subscapular fluid collection of the liver -Patient with history of peritoneal carcinomatosis. -Fluid collection felt to likely be multifactorial. -Could possibly be hematoma secondary to recent fall. -Hemoglobin currently stable. -General Surgery was following and feel no surgical intervention needed at this time.  3.  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 Bertis Ruddy oncology in the outpatient setting who is currently not recommending chemotherapy for patient and do not see a possibility of patient getting stronger or better in the near future and recommending goals of care discussion with palliative care team and consideration for transition to comfort measures. -Palliative care following and to help him symptom management and in discussions with patient. -Appreciate palliative care input and recommendations. -Patient noted to have decompensated overnight. -Patient reassessed by palliative care who discussed with family and decision made to transition to comfort measures.  4.  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 -Likely etiology of severe constipation. -Patient being followed by palliative care who are managing patient's pain. -Continue duloxetine 60 mg daily, fentanyl patch 50 mcg/h every 72 hours, gabapentin 300 mg 4 times daily, IV Dilaudid 0.5 mg every 6 hours as needed. -Pregabalin 100 mg 3 times daily started. -Patient noted to be somewhat lethargic, noted to have bout of emesis overnight and likely may have aspirated and noted to be on chronic pain medications. -Palliative care following and appreciate input and recommendations.  5.  Hyperlipidemia -Statin on hold.  6.   GERD -PPI.  7.  Hypertension -BP currently stable. -Avapro on hold.   8.  Iron deficiency anemia -H&H stable at 10.9.  9.  Hypokalemia/hypomagnesemia -Repleted. -Potassium at 4.1, magnesium at 1.8.       DVT prophylaxis: Lovenox Code Status: DNR Family Communication: Updated patient.  No family at bedside. Disposition: Likely residential hospice home.   Status is: Inpatient Remains inpatient appropriate because: Severity of illness   Consultants:  GYN oncology: Dr. Ezzard Standing 01/12/2023 General Surgery: Dr.Connor 01/12/2023 Palliative care: Dr. Patterson Hammersmith 01/13/2023 Hematology/oncology: Dr. Bertis Ruddy 01/13/2023  Procedures:  CT abdomen and pelvis 01/11/2023 Abdominal films 01/13/2023   Antimicrobials:  Anti-infectives (From admission, onward)    Start     Dose/Rate Route Frequency Ordered Stop   01/16/23 0900  Ampicillin-Sulbactam (UNASYN) 3 g in sodium chloride 0.9 % 100 mL IVPB  Status:  Discontinued        3 g 200 mL/hr over 30 Minutes Intravenous Every 6 hours 01/16/23 0805 01/16/23 1232         Subjective: Events overnight noted.  Patient noted to have been deteriorating overnight and noted to have a bout of emesis seen by rapid response concern for aspiration noted to be hypoxic with repeat abdominal findings concerning for ileus versus obstruction.  Patient given a fluid bolus and placed on O2 overnight.   Patient sleeping in bed easily arousable and answering some questions appropriately.  Denies any worsening chest pain or shortness of breath.  Denies any significant abdominal pain.  Does endorse some emesis overnight.  Denies any bowel movement.  Passing flatus.   Objective: Vitals:   01/16/23 0328 01/16/23 0509 01/16/23 0708 01/16/23 1105  BP: 111/78 105/67 132/85 112/74  Pulse: (!) 130 (!) 103 (!) 108 95  Resp:  18 18 18   Temp:  97.7 F (36.5 C) 97.9 F (36.6 C) 97.7 F (36.5 C)  TempSrc:  Oral Oral   SpO2: 91% 95% 94% 91%  Weight:      Height:         Intake/Output Summary (Last 24 hours) at 01/16/2023 1313 Last data filed at 01/16/2023 1040 Gross per 24 hour  Intake 834.09 ml  Output 400 ml  Net 434.09 ml   Filed Weights   01/11/23 1356  Weight: 69.8 kg    Examination:  General exam: Sleeping deeply.  Drowsy. Respiratory system: CTAB anterior lung fields.  No wheezes, no crackles, no rhonchi.  Fair air movement.  Speaking in full sentences.   Cardiovascular system: RRR no murmurs rubs or gallops.  No JVD.  No lower extremity pitting edema.  Gastrointestinal system: Abdomen is soft, nondistended, decreased diffuse tenderness to palpation.  Positive bowel sounds.  No rebound.  No guarding.   Central nervous system: Sleeping, arousable but drowsy.  Moving extremities spontaneously.  No focal neurological deficits.  Extremities: Symmetric 5 x 5 power. Skin: No rashes, lesions or ulcers Psychiatry: Judgement and insight appear normal. Mood & affect appropriate.     Data Reviewed: I have personally reviewed following labs and imaging studies  CBC: Recent Labs  Lab 01/11/23 1425 01/12/23 1128 01/13/23 0206 01/14/23 0629 01/15/23 0330 01/16/23 0415  WBC 13.8* 10.5 8.6 12.5* 9.3 8.6  NEUTROABS 11.7*  --   --   --   --   --   HGB 8.9* 9.1* 8.7* 11.8* 9.5* 10.9*  HCT 28.2* 29.8* 27.9* 35.9* 31.1* 34.5*  MCV 95.6 99.7 97.9 92.8 98.4 95.3  PLT 247 185 167 184 199 206  Basic Metabolic Panel: Recent Labs  Lab 01/12/23 1128 01/13/23 0206 01/14/23 0108 01/15/23 0330 01/16/23 0415  NA 133* 133* 134* 134* 135  K 4.0 3.5 4.4 4.0 4.1  CL 97* 97* 97* 98 98  CO2 21* 25 26 28 25   GLUCOSE 88 73 115* 107* 152*  BUN 17 15 9 13 20   CREATININE 0.43* 0.43* 0.50 0.41* 0.69  CALCIUM 8.9 8.7* 8.8* 8.8* 9.0  MG 1.8 1.6* 1.9 1.9 1.8  PHOS 1.9*  --   --   --   --     GFR: Estimated Creatinine Clearance: 50.8 mL/min (by C-G formula based on SCr of 0.69 mg/dL).  Liver Function Tests: Recent Labs  Lab 01/11/23 1425  01/12/23 1128  AST 26 20  ALT 20 17  ALKPHOS 177* 150*  BILITOT 0.8 1.1  PROT 6.2* 5.6*  ALBUMIN 2.5* 2.3*    CBG: No results for input(s): "GLUCAP" in the last 168 hours.   Recent Results (from the past 240 hour(s))  Urine Culture     Status: Abnormal   Collection Time: 01/06/23  3:58 PM   Specimen: Urine, Random  Result Value Ref Range Status   Specimen Description   Final    URINE, RANDOM Performed at [facility], [address], [place], [place] [address]    Special Requests   Final    NONE Reflexed from [id number] Performed at [facility], [address], [place], [place] [address]    Culture >=100,000 COLONIES/mL ENTEROBACTER CLOACAE (A)  Final   Report Status 01/09/2023 FINAL  Final   Organism ID, Bacteria ENTEROBACTER CLOACAE (A)  Final      Susceptibility   Enterobacter cloacae - MIC*    CEFEPIME <=0.12 SENSITIVE Sensitive     CIPROFLOXACIN <=0.25 SENSITIVE Sensitive     GENTAMICIN <=1 SENSITIVE Sensitive     IMIPENEM 0.5 SENSITIVE Sensitive     NITROFURANTOIN 64 INTERMEDIATE Intermediate     TRIMETH/SULFA <=20 SENSITIVE Sensitive     PIP/TAZO <=4 SENSITIVE Sensitive ug/mL    * >=100,000 COLONIES/mL ENTEROBACTER CLOACAE         Radiology Studies: DG Chest Port 1 View  Result Date: 01/16/2023 CLINICAL DATA:  Vomiting, possible aspiration. EXAM: PORTABLE CHEST 1 VIEW, ABDOMEN ONE VIEW COMPARISON:  01/13/2023, 02/21/2009. FINDINGS: Chest: The heart size and mediastinal contours are within normal limits. There is atherosclerotic calcification of the aorta. Lung volumes are low with mild airspace disease at the lung bases, greater on the right than on the left. No effusion or pneumothorax. A right chest port terminates over the superior vena cava. Abdomen: Mildly distended gas-filled loops of small bowel are noted in the abdomen measuring up to 3.4 cm. Gas is identified in the rectum. Lumbar spinal fusion hardware is noted at  L4-L5. IMPRESSION: 1. Low lung volumes with patchy airspace disease at the lung bases, greater on the right than on the left, possible atelectasis or infiltrate. 2. Distended loops of small bowel in the abdomen measuring up to 3.4 cm, possible ileus versus obstruction. Electronically Signed   By: Thornell Sartorius M.D.   On: 01/16/2023 04:11   DG Abd 1 View  Result Date: 01/16/2023 CLINICAL DATA:  Vomiting, possible aspiration. EXAM: PORTABLE CHEST 1 VIEW, ABDOMEN ONE VIEW COMPARISON:  01/13/2023, 02/21/2009. FINDINGS: Chest: The heart size and mediastinal contours are within normal limits. There is atherosclerotic calcification of the aorta. Lung volumes are low with mild airspace disease at the lung bases, greater on  the right than on the left. No effusion or pneumothorax. A right chest port terminates over the superior vena cava. Abdomen: Mildly distended gas-filled loops of small bowel are noted in the abdomen measuring up to 3.4 cm. Gas is identified in the rectum. Lumbar spinal fusion hardware is noted at L4-L5. IMPRESSION: 1. Low lung volumes with patchy airspace disease at the lung bases, greater on the right than on the left, possible atelectasis or infiltrate. 2. Distended loops of small bowel in the abdomen measuring up to 3.4 cm, possible ileus versus obstruction. Electronically Signed   By: Thornell Sartorius M.D.   On: 01/16/2023 04:11        Scheduled Meds:  [id number]  1 patch Transdermal Q72H   Continuous Infusions:   LOS: 5 days    Time spent: 35 minutes    Ramiro Harvest, MD [facility]   To contact the attending provider between 7A-7P or the covering provider during after hours 7P-7A, please log into the web site [email or web address] and access using universal [place] password for that web site. If you do not have the password, please call the hospital operator.  01/16/2023, 1:13 PM

## 2023-01-16 NOTE — Plan of Care (Signed)
Problem: Education: Goal: Knowledge of General Education information will improve Description Including pain rating scale, medication(s)/side effects and non-pharmacologic comfort measures Outcome: Progressing   Problem: Health Behavior/Discharge Planning: Goal: Ability to manage health-related needs will improve Outcome: Progressing

## 2023-01-16 NOTE — TOC Progression Note (Addendum)
Transition of Care [facility]) - Progression Note    Patient Details  Name: Alexandria Mclaughlin MRN: [id number] Date of Birth: 1940-07-29  [facility] CM/SW Contact  Erin Sons, [place] Phone Number: 01/16/2023, 10:56 AM  Clinical Narrative:     CSW provided pt's daughter with bed offers. CSW also included private pay offers from [facility] and [employer]. Daughter interested in the potential that pt may be appropriate for residential hospice facility following rapid response last night. CSW deferred to PMT.   1245: PMT updated CSW that plan is for comfort care now and family interested in hospice facility. CSW spoke with daughter [name] who has preference for [facility] as it is closest for family. CSW referred pt to [facility]; hospital liaisons will review referral. No beds available today.   Expected Discharge Plan: Skilled Nursing Facility Barriers to Discharge: Continued Medical Work up, English as a second language teacher, SNF Pending bed offer  Expected Discharge Plan and Services                                               Social Determinants of Health (SDOH) Interventions SDOH Screenings   Food Insecurity: No Food Insecurity (01/13/2023)  Housing: Patient Declined (01/13/2023)  Transportation Needs: No Transportation Needs (01/13/2023)  Utilities: Not At Risk (01/13/2023)  Depression (PHQ2-9): Low Risk  (01/07/2023)  Social Connections: Unknown (07/04/2021)   Received from [facility], [facility]  Stress: Stress Concern Present (12/30/2022)  Tobacco Use: Medium Risk (01/11/2023)    Readmission Risk Interventions     No data to display

## 2023-01-16 NOTE — Progress Notes (Signed)
Daily Progress Note   Patient Name: Alexandria Mclaughlin       Date: 01/16/2023 DOB: [birth year]  Age: 82 y.o. MRN#: [id number] Attending Physician: Rodolph Bong, MD Primary Care Physician: Patient, No Pcp Per Admit Date: 01/11/2023 Length of Stay: 5 days  Reason for Consultation/Follow-up: Establishing goals of care  Subjective:   CC: Patient now unresponsive after rapid response overnight. Following up regarding complex medical decision making.  Subjective:  Reviewed EMR prior to presenting to bedside.  Discussed care with bedside RN as well for updates.  Patient had worsening mentation overnight with concern for aspiration after vomiting.  Imaging showing concern for ileus versus obstruction.  Patient started on appropriate medications for management.  On presenting to bedside this morning, patient not responsive.  Patient appears pale.  No family present at bedside.  Able to call patient's daughter, [name], who are noted was recently at bedside.  Able to discuss patient's today rapid deterioration overnight.  Though patient had previously been hopeful for rehab, discussed transition to comfort focused care at this time with hospice initiation now since patient has had this deterioration.  Daughter [name] agrees with transition to hospice now since rehab is no longer appropriate.  Discussed transition to full comfort care.  Based on patient's current assessment, may be appropriate for inpatient hospice evaluation.  Alexandria Mclaughlin agreeing to speaking to Child psychotherapist about this.  Discussed we will focus on symptom management at the end of life.  Spent time answering questions as able.  Alexandria Mclaughlin calling her brother, Alexandria Mclaughlin, to update him shortly about full transition to comfort.  Noted palliative medicine team to continue following with patient's medical journey.  Updated IDT after discussion with daughter and placed orders for transition to comfort focused care.  Objective:   Vital Signs:  BP 132/85 (BP  Location: Right Arm)   Pulse (!) 108   Temp 97.9 F (36.6 C) (Oral)   Resp 18   Ht 5\' 3"  (1.6 m)   Wt 69.8 kg   SpO2 94%   BMI 27.27 kg/m   Physical Exam: General: Unresponsive, pale, ill-appearing Cardiovascular: Tachycardia noted Respiratory: no increased work of breathing noted, not in respiratory distress Neuro: Unresponsive  Imaging: I personally reviewed recent imaging.   Assessment & Plan:   Assessment: Patient is an 82 year old female with a past medical history of high-grade ovarian cancer on chemotherapy, EP PVD, CVA, CAD, GERD, hyperlipidemia, and hypothyroidism who was admitted on 01/11/2023 for management of nausea, vomiting, and abdominal pain.  Upon admission, imaging showed evidence of SBO as well as new subcapsular perihepatic fluid collection.  Gyn Onc, general surgery, and oncology consulted for evaluation.  Patient deemed not to be a surgical candidate and recommended supportive care.  Palliative medicine team consulted to assist with complex medical decision making.   Recommendations/Plan: # Complex medical decision making/goals of care:  -Patient had acute medical deterioration overnight with rapid response called after patient vomited, likely aspirated, and was found to have obstruction/ileus on imaging.   -Discussed care with patient's daughter, [name], over the phone today.  Since patient no longer appropriate for rehab, will transition to comfort focused care at this time.  Had been planning to engage hospice after rehab anyway so will now engage hospice for possible inpatient referral.  TOC consulted to assist with coordination of care.  -At this time we will discontinue interventions that are no longer focused on comfort such as IV fluids, imaging, or lab work.  Will instead focus on symptom  management of pain, dyspnea, and agitation in the setting of end-of-life care.    Code Status: Do not attempt resuscitation (DNR) - Comfort care   # Symptom management                 -Pain, acute on chronic in setting of high-grade ovarian cancer Patient now unresponsive after events from rapid response and medical deterioration.  Discontinuing oral medications at this time and transitioning to IV for symptom management.  - Start IV Dilaudid 1 mg every hour as needed (this is dose patient previously needed for pain management due to severe pain in abdomen.  Continue to adjust based on patient's symptom burden.  If patient needing frequent dosing, may need to consider continuous infusion.    -Continue fentanyl 50 mcg/h patch every 72 hours                 -Anxiety/agitation, in the setting of end-of-life care                               -Start IV Ativan 0.5 mg every 4 hours as needed. Continue to adjust based on patient's symptom burden.                                 -Start IV Haldol 0.5 mg every 4 hours as needed. Continue to adjust based on patient's symptom burden.                   -Secretions, in the setting of end-of-life care                               -Start IV glycopyrrolate 0.2 mg every 4 hours as needed.                 -Constipation Discontinue oral medications.   -Has bisacodyl suppository daily as needed rectally.   # Psycho-social/Spiritual Support:  - Support System: son, daughter   # Discharge Planning: Transitioning to comfort focused care.  TOC assisting with referral for evaluation at inpatient hospice.  Discussed with: patient, oncology, hospitalist, RN, patient' daughter   Thank you for allowing the palliative care team to participate in the care Alexandria Mclaughlin.  Alvester Morin, DO Palliative Care Provider PMT # [phone]  If patient remains symptomatic despite maximum doses, please call PMT at [phone] between 0700 and 1900. Outside of these hours, please call attending, as PMT does not have night coverage.  *Please note that this is a verbal dictation therefore any spelling or grammatical errors are due to the "[employer]" system interpretation.                                 -

## 2023-01-16 NOTE — Progress Notes (Signed)
RN attempted to wake patient in order to administer scheduled PO medications, patient drowsy and difficult to wake up, RN asked if patient could take her PO medications, patient refused to take PO medications and stated that she was unable to swallow at this time, RN educated patient on the importance of taking medications prescribed by MD, patient continued to doze off while RN is speaking to her, MD aware of situation.

## 2023-01-17 DIAGNOSIS — R188 Other ascites: Secondary | ICD-10-CM | POA: Diagnosis not present

## 2023-01-17 DIAGNOSIS — J690 Pneumonitis due to inhalation of food and vomit: Secondary | ICD-10-CM | POA: Diagnosis not present

## 2023-01-17 DIAGNOSIS — G893 Neoplasm related pain (acute) (chronic): Secondary | ICD-10-CM | POA: Diagnosis not present

## 2023-01-17 DIAGNOSIS — K56600 Partial intestinal obstruction, unspecified as to cause: Secondary | ICD-10-CM | POA: Diagnosis not present

## 2023-01-17 DIAGNOSIS — Z515 Encounter for palliative care: Secondary | ICD-10-CM | POA: Diagnosis not present

## 2023-01-17 MED ORDER — LORAZEPAM 0.5 MG PO TABS: 0.5000 mg | ORAL_TABLET | Freq: Three times a day (TID) | ORAL | 0 refills | Status: DC | PRN

## 2023-01-17 MED ORDER — HYDROMORPHONE HCL 1 MG/ML IJ SOLN
0.5000 mg | INTRAMUSCULAR | Status: DC | PRN
  Administered 2023-01-17 (×2): 1 mg via INTRAVENOUS
  Filled 2023-01-17 (×2): qty 1

## 2023-01-17 MED ORDER — BISACODYL 10 MG RE SUPP: 10.0000 mg | Freq: Every day | RECTAL | 0 refills | Status: DC | PRN

## 2023-01-17 MED ORDER — GLYCOPYRROLATE 1 MG PO TABS: 1.0000 mg | ORAL_TABLET | Freq: Three times a day (TID) | ORAL | 0 refills | Status: DC | PRN

## 2023-01-17 MED ORDER — HALOPERIDOL 0.5 MG PO TABS: 0.5000 mg | ORAL_TABLET | Freq: Four times a day (QID) | ORAL | 0 refills | Status: DC | PRN

## 2023-01-17 MED ORDER — FENTANYL 50 MCG/HR TD PT72: 1.0000 | MEDICATED_PATCH | TRANSDERMAL | 0 refills | Status: DC

## 2023-01-17 MED ORDER — HEPARIN SOD (PORK) LOCK FLUSH 100 UNIT/ML IV SOLN
500.0000 [IU] | INTRAVENOUS | Status: AC | PRN
  Administered 2023-01-17: 500 [IU]
  Filled 2023-01-17: qty 5

## 2023-01-17 MED ORDER — BISACODYL 10 MG RE SUPP: 10.0000 mg | Freq: Once | RECTAL | Status: DC

## 2023-01-17 MED ORDER — LORAZEPAM 2 MG/ML IJ SOLN
0.5000 mg | INTRAMUSCULAR | Status: DC | PRN
  Administered 2023-01-17: 1 mg via INTRAVENOUS
  Filled 2023-01-17: qty 1

## 2023-01-17 NOTE — Discharge Summary (Addendum)
Physician Discharge Summary  Alexandria Mclaughlin WUJ:[id number] DOB: 04-28-1940 DOA: 01/11/2023  PCP: Patient, No Pcp Per  Admit date: 01/11/2023 Discharge date: 01/17/2023  Time spent: 60 minutes  Recommendations for Outpatient Follow-up:  Patient will be discharged to residential hospice home at [facility].  Follow-up with MD at [facility].   Discharge Diagnoses:  Principal Problem:   Intraabdominal fluid collection Active Problems:   Aspiration pneumonia (HCC)   Ovarian cancer (HCC)   Peritoneal carcinomatosis (HCC)   Cancer associated pain   History of recent fall   Chronic narcotic use   Anemia of chronic disease   IDA (iron deficiency anemia)   Partial small bowel obstruction (HCC)   Palliative care encounter   Hypokalemia   Hypomagnesemia   DNR (do not resuscitate)   Concern about end of life   Discharge Condition: Stable  Diet recommendation: Regular diet/comfort feeds  Filed Weights   01/11/23 1356  Weight: 69.8 kg    History of present illness:  HPI per Dr. Haywood Lasso is a 82 y.o. female with medical history significant of metastatic ovarian cancer with peritoneal carcinomatosis, mets to lumbar and thoracic spine, DDD, CAD, HTN, GERD, HLD, prior spinal fusion, anxiety and depression and CVA, and insomnia who presents to the ED for evaluation of nausea. Patient reports that over the last week, she has become nauseated and has had multiple emesis. She has had difficulty keeping anything down as well as poor appetite. She is only been able to tolerate some more things and Jell-O over the last week. States her back pain has been worsening over the last few weeks. She also reports some weight loss as well as intermittent night sweats but denies any abdominal pain, fevers, chills, headaches, dizziness, dysuria, hematuria or bloody stools.   Of note, patient was hospitalized from 10/19/22 to 12/27/22 for intractable abdominal pain related to her advancing  metastatic ovarian cancer with peritoneal carcinomatosis. She is s/p a superior hypogastric plexus block with IR with improving pain since then. She was also diagnosed with UTI on 11/12 and prescribed Keflex which was eventually changed to Bactrim after the results of the urine culture came back.  States she was unable to keep much of the antibiotics down due to the vomiting.   ED course: Tachycardic to HR in the 100 but overall normal vitals. Labs show WBC 13.8, Hgb 8.9, platelet 247, K+ 2.8, creatinine 0.53, albumin 2.5, AST/ALT 26/20, alk phos 177, mag 1.5, lactic acid 1.4, UA with ketonuria but no evidence of infection. CT A/P showed new subscapular 11.9 x 5.9 x 15 cm fluid collection in the right liver, evidence of early or partial small bowel obstruction, slightly enlarged right adnexal cystic and solid mass measuring 3.8 x 4.2 x 2.7 cm with associated loculated tubular fluid collection extending to the central pelvis, stable sclerotic metastatic disease in the thoracolumbar spine and a chronic moderate L1 compression deformity. General surgery was consulted by ER doc for possible subscapular liver hematoma and they recommended close monitoring due to stable hemoglobin. Gyn/Onc was consulted and plans to see patient in the morning [facility] was consulted for admission   Hospital Course:  #1 possible SBO versus severe constipation -Patient presented with complaints of nausea vomiting abdominal pain. -Patient with high-grade ovarian cancer which may also be contributing to patient's symptoms. -Patient noted to have passing flatus, stated had a small bowel movement after bowel regimen and fleets enema yesterday and requesting another enema. -Patient seen in consultation by  general surgery recommended conservative treatment for now and diet was advanced to a clear liquid diet which patient initially tolerated.   -Diet was subsequently advanced to full liquid diet and subsequently a regular diet.   -Patient noted to have deteriorated the evening of 01/15/2023 with some emesis and concern for aspiration. -Abdominal films done concerning for ileus versus obstruction. -Patient reassessed by palliative care who discussed with family and decision made to transition to full comfort measures. -Patient will be discharged to residential hospice home at [facility].    2.  Subscapular fluid collection of the liver -Patient with history of peritoneal carcinomatosis. -Fluid collection felt to likely be multifactorial. -Could possibly be hematoma secondary to recent fall. -Hemoglobin remained stable.  -General Surgery was following and feel no surgical intervention needed at this time and subsequently signed off.. -Patient transitioned to full comfort measures and be discharged to residential hospice home.   3.  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 Bertis Ruddy oncology in the outpatient setting who is currently not recommending chemotherapy for patient and do not see a possibility of patient getting stronger or better in the near future and recommending goals of care discussion with palliative care team and consideration for transition to comfort measures. -Palliative care was following and to help him symptom management and in discussions with patient. -Patient noted to have decompensated the evening of 01/15/2023.   -Patient reassessed by palliative care who discussed with family and decision made to transition to comfort measures. -Patient will be discharged to residential hospice home.   4.  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 -Likely etiology of severe constipation. -Patient being followed by palliative care who are managing patient's pain. -Patient initially placed duloxetine 60 mg daily, fentanyl patch 50 mcg/h every 72 hours, gabapentin 300 mg 4  times daily, IV Dilaudid 0.5 mg every 6 hours as needed. -Pregabalin 100 mg 3 times daily started. -Patient noted to be somewhat lethargic, noted to have bout of emesis the evening of 01/15/2023 and concerned that patient may have been aspirated as patient on chronic pain medications.   -Palliative care reassessed patient discussed with family and decision made to transition to full comfort measures.   -Patient will be discharged to residential hospice home.   5.  Hyperlipidemia -Statin on hold and subsequently discontinued as patient was subsequently transition to full comfort measures..    6.  GERD -Patient was maintained on PPI.   7.  Hypertension -BP remained stable off antihypertensive medications.   -Patient was transition to full comfort measures.    8.  Iron deficiency anemia -H&H stable at 10.9.   9.  Hypokalemia/hypomagnesemia -Repleted.      Procedures: CT abdomen and pelvis 01/11/2023 Abdominal films 01/13/2023  Consultations: GYN oncology: Dr. Ezzard Standing 01/12/2023 General Surgery: Dr.Connor 01/12/2023 Palliative care: Dr. Patterson Hammersmith 01/13/2023 Hematology/oncology: Dr. Bertis Ruddy 01/13/2023  Discharge Exam: Vitals:   01/17/23 0520 01/17/23 1342  BP: 115/67 127/81  Pulse: 87 83  Resp: 17 16  Temp: 98.4 F (36.9 C) 97.6 F (36.4 C)  SpO2: 97% 98%    General: NAD.  Alert. Cardiovascular: RRR no murmurs rubs or gallops.  No JVD.  No lower extremity edema. Respiratory: CTAB anterior lung fields.  Discharge Instructions   Discharge Instructions     Diet general   Complete by: As directed    Increase activity slowly   Complete by: As directed  Allergies as of 01/17/2023   No Known Allergies      Medication List     STOP taking these medications    ascorbic acid 500 MG tablet Commonly known as: VITAMIN C   cephALEXin 500 MG capsule Commonly known as: KEFLEX   Comirnaty syringe Generic drug: COVID-19 mRNA vaccine (Pfizer)   cyanocobalamin  1000 MCG tablet   dexamethasone 4 MG tablet Commonly known as: DECADRON   Fluzone High-Dose 0.5 ML injection Generic drug: Influenza vac split quadrivalent PF   gabapentin 600 MG tablet Commonly known as: NEURONTIN   irbesartan 150 MG tablet Commonly known as: AVAPRO   iron polysaccharides 150 MG capsule Commonly known as: NIFEREX   magnesium oxide 400 (240 Mg) MG tablet Commonly known as: MAG-OX   methimazole 5 MG tablet Commonly known as: TAPAZOLE   oxyCODONE-acetaminophen 10-325 MG tablet Commonly known as: Percocet   pantoprazole 40 MG tablet Commonly known as: PROTONIX   pregabalin 50 MG capsule Commonly known as: Lyrica   rosuvastatin 40 MG tablet Commonly known as: CRESTOR   sulfamethoxazole-trimethoprim 800-160 MG tablet Commonly known as: BACTRIM DS   traZODone 100 MG tablet Commonly known as: DESYREL   Vitamin D 50 MCG (2000 UT) Caps       TAKE these medications    acetaminophen 325 MG tablet Commonly known as: TYLENOL Take 2 tablets (650 mg total) by mouth every 8 (eight) hours as needed for headache, fever or mild pain (pain score 1-3).   bisacodyl 10 MG suppository Commonly known as: DULCOLAX Place 1 suppository (10 mg total) rectally daily as needed for moderate constipation.   DULoxetine 60 MG capsule Commonly known as: CYMBALTA TAKE 1 CAPSULE BY MOUTH EVERY DAY What changed: how much to take   fentaNYL 50 MCG/HR Commonly known as: DURAGESIC Place 1 patch onto the skin every 3 (three) days.   glycopyrrolate 1 MG tablet Commonly known as: Robinul Take 1 tablet (1 mg total) by mouth 3 (three) times daily as needed (Excessive secretions).   haloperidol 0.5 MG tablet Commonly known as: HALDOL Take 1 tablet (0.5 mg total) by mouth every 6 (six) hours as needed for agitation.   lactulose 10 GM/15ML solution Commonly known as: CHRONULAC TAKE 15 MLS (10 G TOTAL) BY MOUTH 3 (THREE) TIMES DAILY.   lidocaine-prilocaine cream Commonly  known as: EMLA APPLY TO AFFECTED AREA ONCE AS DIRECTED What changed: See the new instructions.   LORazepam 0.5 MG tablet Commonly known as: Ativan Take 1 tablet (0.5 mg total) by mouth every 8 (eight) hours as needed for anxiety.   Melatonin 10 MG Tabs Take 10 mg by mouth at bedtime as needed (sleep).   polyethylene glycol 17 g packet Commonly known as: MIRALAX / GLYCOLAX Take 17 g by mouth 2 (two) times daily.   prochlorperazine 10 MG tablet Commonly known as: COMPAZINE TAKE 1 TABLET BY MOUTH EVERY 6 HOURS AS NEEDED FOR NAUSEA OR VOMITING. What changed: reasons to take this   senna 8.6 MG tablet Commonly known as: SENOKOT Take 2 tablets by mouth 2 (two) times daily.       No Known Allergies  Follow-up Information     MD AT HOSPICE FACILITY Follow up.                   The results of significant diagnostics from this hospitalization (including imaging, microbiology, ancillary and laboratory) are listed below for reference.    Significant Diagnostic Studies: DG Chest [facility] 1 [address]  Result Date: 01/16/2023 CLINICAL DATA:  Vomiting, possible aspiration. EXAM: PORTABLE CHEST 1 VIEW, ABDOMEN ONE VIEW COMPARISON:  01/13/2023, 02/21/2009. FINDINGS: Chest: The heart size and mediastinal contours are within normal limits. There is atherosclerotic calcification of the aorta. Lung volumes are low with mild airspace disease at the lung bases, greater on the right than on the left. No effusion or pneumothorax. A right chest port terminates over the superior vena cava. Abdomen: Mildly distended gas-filled loops of small bowel are noted in the abdomen measuring up to 3.4 cm. Gas is identified in the rectum. Lumbar spinal fusion hardware is noted at L4-L5. IMPRESSION: 1. Low lung volumes with patchy airspace disease at the lung bases, greater on the right than on the left, possible atelectasis or infiltrate. 2. Distended loops of small bowel in the abdomen measuring up to 3.4 cm,  possible ileus versus obstruction. Electronically Signed   By: Thornell Sartorius M.D.   On: 01/16/2023 04:11   DG Abd 1 View  Result Date: 01/16/2023 CLINICAL DATA:  Vomiting, possible aspiration. EXAM: PORTABLE CHEST 1 VIEW, ABDOMEN ONE VIEW COMPARISON:  01/13/2023, 02/21/2009. FINDINGS: Chest: The heart size and mediastinal contours are within normal limits. There is atherosclerotic calcification of the aorta. Lung volumes are low with mild airspace disease at the lung bases, greater on the right than on the left. No effusion or pneumothorax. A right chest port terminates over the superior vena cava. Abdomen: Mildly distended gas-filled loops of small bowel are noted in the abdomen measuring up to 3.4 cm. Gas is identified in the rectum. Lumbar spinal fusion hardware is noted at L4-L5. IMPRESSION: 1. Low lung volumes with patchy airspace disease at the lung bases, greater on the right than on the left, possible atelectasis or infiltrate. 2. Distended loops of small bowel in the abdomen measuring up to 3.4 cm, possible ileus versus obstruction. Electronically Signed   By: Thornell Sartorius M.D.   On: 01/16/2023 04:11   DG Abd 2 Views  Result Date: 01/13/2023 CLINICAL DATA:  82 year old female with nausea, vomiting, ovarian cancer. Possible small-bowel obstruction on CT 2 days ago. EXAM: ABDOMEN - 2 VIEW COMPARISON:  CT Abdomen and Pelvis 01/10/2022. FINDINGS: Portable AP upright and supine view at 0821 hours. No pneumoperitoneum. Bowel-gas pattern has not significantly changed over the past 2 days. Gas in multiple left and mid abdominal small bowel loops which are at the upper limits of normal. Retained stool in the colon. Gas in the rectum. Stable visualized osseous structures. Previous lumbar fusion. Stable right medial iliac bone cement. Stable elevated right hemidiaphragm. IMPRESSION: Bowel-gas pattern not significantly changed from the CT two days ago. No pneumoperitoneum. Electronically Signed   By: Odessa Fleming  M.D.   On: 01/13/2023 14:05   CT ABDOMEN PELVIS W CONTRAST  Result Date: 01/11/2023 CLINICAL DATA:  Nausea and vomiting.  History of ovarian cancer. EXAM: CT ABDOMEN AND PELVIS WITH CONTRAST TECHNIQUE: Multidetector CT imaging of the abdomen and pelvis was performed using the standard protocol following bolus administration of intravenous contrast. RADIATION DOSE REDUCTION: This exam was performed according to the departmental dose-optimization program which includes automated exposure control, adjustment of the mA and/or kV according to patient size and/or use of iterative reconstruction technique. CONTRAST:  OMNIPAQUE IOHEXOL 300 MG/ML  SOLN COMPARISON:  December 19, 2022 FINDINGS: Lower chest: No acute abnormality. Hepatobiliary: New subcapsular fluid collection in the right liver measures water density, and measures 11.9 x 5.9 x 15 cm. Otherwise normal attenuation of the liver.  The gallbladder is normally distended. Pancreas: Unremarkable. No pancreatic ductal dilatation or surrounding inflammatory changes. Spleen: Normal in size without focal abnormality. Adrenals/Urinary Tract: Adrenal glands are unremarkable. Kidneys are normal, without renal calculi, focal lesion, or hydronephrosis. Bladder is unremarkable. Stomach/Bowel: Sub pathologic gas and fluid distension of small bowels in the mid abdomen, which measure up to 2.8 cm. Preserved colonic pattern. Vascular/Lymphatic: Aortic atherosclerosis. No enlarged abdominal or pelvic lymph nodes. Reproductive: Right adnexal cystic and solid mass measures 3.8 x 4.2 x 2.7 cm., slightly enlarged. Loculated tubular fluid collection is seen extending from the mass to the central pelvis. Post hysterectomy. Other: No abdominal wall hernia. Musculoskeletal: Sclerotic metastatic disease in the thoracolumbar spine is mostly stable. No new compression fractures. Chronic moderate L1 compression deformity. IMPRESSION: 1. New subcapsular fluid collection in the right  liver measures water density, and measures 11.9 x 5.9 x 15 cm. 2. Sub pathologic gas and fluid distension of small bowels in the mid abdomen, with cross-section measuring up to 2.8 cm. Findings are concerning for early or partial small bowel obstruction. 3. Right adnexal cystic and solid mass measures 3.8 x 4.2 x 2.7 cm, slightly enlarged. Loculated tubular fluid collection is seen extending from the mass to the central pelvis, also slightly enlarged. 4. Sclerotic metastatic disease in the thoracolumbar spine is mostly stable. 5. Chronic moderate L1 compression deformity. 6. Aortic atherosclerosis. Aortic Atherosclerosis (ICD10-I70.0). Electronically Signed   By: Ted Mcalpine M.D.   On: 01/11/2023 16:03   CT Head Wo Contrast  Result Date: 01/06/2023 CLINICAL DATA:  Head and neck trauma, pain EXAM: CT HEAD WITHOUT CONTRAST CT CERVICAL SPINE WITHOUT CONTRAST TECHNIQUE: Multidetector CT imaging of the head and cervical spine was performed following the standard protocol without intravenous contrast. Multiplanar CT image reconstructions of the cervical spine were also generated. RADIATION DOSE REDUCTION: This exam was performed according to the departmental dose-optimization program which includes automated exposure control, adjustment of the mA and/or kV according to patient size and/or use of iterative reconstruction technique. COMPARISON:  12/27/2020 CT head; no prior CT cervical spine, correlation is made with 12/2020 CTA head and neck FINDINGS: CT HEAD FINDINGS Brain: No evidence of acute infarct, hemorrhage, mass, mass effect, or midline shift. No hydrocephalus or extra-axial fluid collection. Periventricular white matter changes, likely the sequela of chronic small vessel ischemic disease. Vascular: No hyperdense vessel. Skull: Negative for fracture or focal lesion. Sinuses/Orbits: Mucous retention cyst in the left ethmoid air cells. Otherwise clear paranasal sinuses. Status post bilateral lens  replacements. Other: The mastoid air cells are well aerated. CT CERVICAL SPINE FINDINGS Alignment: No traumatic listhesis. Preservation of the normal cervical lordosis. Skull base and vertebrae: No acute fracture or suspicious osseous lesion. Soft tissues and spinal canal: No prevertebral fluid or swelling. No visible canal hematoma. Disc levels: Degenerative changes in the cervical spine.No high-grade spinal canal stenosis. Upper chest: No focal pulmonary opacity or pleural effusion. IMPRESSION: 1. No acute intracranial process. 2. No acute fracture or traumatic listhesis in the cervical spine. Electronically Signed   By: Wiliam Ke M.D.   On: 01/06/2023 12:57   CT Cervical Spine Wo Contrast  Result Date: 01/06/2023 CLINICAL DATA:  Head and neck trauma, pain EXAM: CT HEAD WITHOUT CONTRAST CT CERVICAL SPINE WITHOUT CONTRAST TECHNIQUE: Multidetector CT imaging of the head and cervical spine was performed following the standard protocol without intravenous contrast. Multiplanar CT image reconstructions of the cervical spine were also generated. RADIATION DOSE REDUCTION: This exam was performed according to the  departmental dose-optimization program which includes automated exposure control, adjustment of the mA and/or kV according to patient size and/or use of iterative reconstruction technique. COMPARISON:  12/27/2020 CT head; no prior CT cervical spine, correlation is made with 12/2020 CTA head and neck FINDINGS: CT HEAD FINDINGS Brain: No evidence of acute infarct, hemorrhage, mass, mass effect, or midline shift. No hydrocephalus or extra-axial fluid collection. Periventricular white matter changes, likely the sequela of chronic small vessel ischemic disease. Vascular: No hyperdense vessel. Skull: Negative for fracture or focal lesion. Sinuses/Orbits: Mucous retention cyst in the left ethmoid air cells. Otherwise clear paranasal sinuses. Status post bilateral lens replacements. Other: The mastoid air cells  are well aerated. CT CERVICAL SPINE FINDINGS Alignment: No traumatic listhesis. Preservation of the normal cervical lordosis. Skull base and vertebrae: No acute fracture or suspicious osseous lesion. Soft tissues and spinal canal: No prevertebral fluid or swelling. No visible canal hematoma. Disc levels: Degenerative changes in the cervical spine.No high-grade spinal canal stenosis. Upper chest: No focal pulmonary opacity or pleural effusion. IMPRESSION: 1. No acute intracranial process. 2. No acute fracture or traumatic listhesis in the cervical spine. Electronically Signed   By: Wiliam Ke M.D.   On: 01/06/2023 12:57   IR BLOCK INJ HYPOGASTRIC PLEXUS  Result Date: 12/23/2022 CLINICAL DATA:  82 year old female with advanced stage ovarian cancer with retractable pelvic pain. EXAM: Fluoroscopic guided superior hypogastric nerve block. ANESTHESIA/SEDATION: Moderate (conscious) sedation was employed during this procedure. A total of Versed 1 mg and Fentanyl 50 mcg was administered intravenously. Moderate Sedation Time: 10 minutes. The patient's level of consciousness and vital signs were monitored continuously by radiology nursing throughout the procedure under my direct supervision. MEDICATIONS: 80 mg Depo-Medrol, 8 mL 0.5% bupivacaine CONTRAST:  2mL OMNIPAQUE IOHEXOL 300 MG/ML  SOLN COMPLICATIONS: None immediate PROCEDURE: Fluoroscopic guided superior hypogastric nerve block. FINDINGS: The patient was positioned supine on the IR table. The lower abdomen was prepped and draped in standard fashion. The procedure was planned. Under direct fluoroscopic visualization, a 22 gauge Chiba needle was directed to the anterior aspect of the L5 vertebral body. 1% lidocaine was injected which flowed freely without resistance. Contrast was injected which demonstrated opacification in the region of the superior hypogastric nerve without evidence of vascular opacification. Next, a solution of 80 mg Depo-Medrol and 8 mL 0.5%  bupivacaine was administered. The needle was then removed. The patient tolerated procedure well was transferred back to the floor in good condition. IMPRESSION: Technically successful fluoroscopic guided superior hypogastric nerve block with local anesthetic and steroid. Marliss Coots, MD [facility] Electronically Signed   By: Marliss Coots M.D.   On: 12/23/2022 16:56   CT T-SPINE NO CHARGE  Result Date: 12/20/2022 CLINICAL DATA:  Study identified as missing a report at 4:13 am on 12/20/2022. 82 year old female with abdomen and spine pain.  Ovarian cancer. EXAM: CT THORACIC SPINE WITH CONTRAST TECHNIQUE: Multiplanar CT images of the thoracic spine were reconstructed from contemporary CT of the Abdomen. RADIATION DOSE REDUCTION: This exam was performed according to the departmental dose-optimization program which includes automated exposure control, adjustment of the mA and/or kV according to patient size and/or use of iterative reconstruction technique. CONTRAST:  No additional COMPARISON:  CT Abdomen and Pelvis, lumbar spine CT from the same time reported separately. Restaging CT Chest, Abdomen, and Pelvis 10/15/2022. Outside thoracic spine MRI 02/22/2021. FINDINGS: Limited cervical spine imaging: Cervicothoracic junction alignment is within normal limits. Thoracic spine segmentation:  Normal. Alignment: Stable  thoracic kyphosis. No significant scoliosis or spondylolisthesis. Vertebrae: Mild chronic superior endplate deformities at T2, T4, T6, T9 are all stable since the 2022 MRI. Background bone mineralization is stable. Small round sclerotic bone lesions in T9 and T10 appear new from the 2022 MRI and are both mildly larger since August (now up to 8 mm versus 4-5 mm previously). Smaller similar T5 and T6 inferior endplate sclerotic foci have also progressed Increased. No destructive osseous lesion in the thoracic spine. No acute pathologic fracture.  Visible ribs appear intact. Lumbar spine is detailed separately. Paraspinal and other soft tissues: Stable right chest Port-A-Cath. Calcified aortic atherosclerosis. Calcified coronary artery atherosclerosis. No pericardial effusion. Lower lung volumes. Small new layering left and trace right pleural effusions. Dependent and lung base atelectasis. Major airways remain patent. Abdomen is reported separately. Thoracic paraspinal soft tissues remain normal. Disc levels: Mild for age thoracic spine degeneration superimposed on the above findings. Degenerative appearing posterior element ankylosis in the upper thoracic spine at least T3 through T6. No CT evidence of thoracic spinal stenosis. IMPRESSION: 1. Small but progressive sclerotic bone metastases in thoracic vertebrae since August restaging CT. 2. No associated pathologic fracture. Multilevel mild chronic compression fractures in the thoracic spine are stable since 2022. 3. New small pleural effusions, pulmonary atelectasis. 4. Abdomen and Lumbar spine are detailed separately. Electronically Signed   By: Odessa Fleming M.D.   On: 12/20/2022 04:23   CT L-SPINE NO CHARGE  Result Date: 12/19/2022 CLINICAL DATA:  Back pain, lower extremity radicular pain EXAM: CT LUMBAR SPINE WITHOUT CONTRAST TECHNIQUE: Multidetector CT imaging of the lumbar spine was performed without intravenous contrast administration. Multiplanar CT image reconstructions were also generated. RADIATION DOSE REDUCTION: This exam was performed according to the departmental dose-optimization program which includes automated exposure control, adjustment of the mA and/or kV according to patient size and/or use of iterative reconstruction technique. COMPARISON:  None Available. FINDINGS: Segmentation: 5 lumbar type vertebrae. Alignment: Normal. Vertebrae: L4-5 anterior posterior lumbar fusion with instrumentation again noted. Degenerative ankylosis of the anterior posterior elements of L5-S1 noted. Chronic  L1 compression deformity with approximately 50% loss of height is unchanged. Sclerotic foci within the L2 and L5 vertebral bodies is again identified suspicious for sclerotic osseous metastases as these are stable since prior examination, but are new since remote prior examination of 03/17/2022. No acute fracture. Paraspinal and other soft tissues: Negative paraspinal soft tissues. Mass within the right hemipelvis is better assessed on accompanying CT examination of the abdomen and pelvis. Disc levels: Intervertebral disc space narrowing and endplate remodeling at L3-4 in keeping with changes of advanced degenerative disc disease. Remaining intervertebral disc heights are preserved. Eccentric left paracentral/foraminal disc bulge at L3-4 noted. Facet hypertrophy results in mild left and moderate right neuroforaminal narrowing at this level. Streak artifact limits evaluation of the neural foramina and spinal canal at L4-5. Bilateral laminectomy and posterior decompression of L5 has been performed. No significant neuroforaminal narrowing or central canal stenosis at this level. IMPRESSION: 1. No acute fracture or listhesis of the lumbar spine. 2. Stable chronic L1 compression deformity with approximately 50% loss of height. 3. Stable sclerotic foci within the L2 and L5 vertebral bodies suspicious for sclerotic osseous metastases as these are stable since prior examination, but are new since remote prior examination of 03/17/2022. 4. L4-5 anterior posterior lumbar fusion with instrumentation. Degenerative ankylosis of the anterior posterior elements of L5-S1. 5. Advanced degenerative disease at L3-4 resulting in mild left and moderate right neuroforaminal narrowing at  this level. Electronically Signed   By: Helyn Numbers M.D.   On: 12/19/2022 20:39   CT ABDOMEN PELVIS W CONTRAST  Result Date: 12/19/2022 CLINICAL DATA:  Abdominal pain EXAM: CT ABDOMEN AND PELVIS WITH CONTRAST TECHNIQUE: Multidetector CT imaging of  the abdomen and pelvis was performed using the standard protocol following bolus administration of intravenous contrast. RADIATION DOSE REDUCTION: This exam was performed according to the departmental dose-optimization program which includes automated exposure control, adjustment of the mA and/or kV according to patient size and/or use of iterative reconstruction technique. CONTRAST:  OMNIPAQUE IOHEXOL 300 MG/ML  SOLN COMPARISON:  10/15/2022 FINDINGS: Lower chest: Trace left pleural effusion. Dependent atelectasis. Coronary artery and aortic atherosclerosis. Hepatobiliary: No focal hepatic abnormality. Gallbladder unremarkable. Pancreas: No focal abnormality or ductal dilatation. Spleen: No focal abnormality.  Normal size. Adrenals/Urinary Tract: No adrenal abnormality. No focal renal abnormality. No stones or hydronephrosis. Urinary bladder is unremarkable. Stomach/Bowel: Stomach, large and small bowel grossly unremarkable. Vascular/Lymphatic: Aortic atherosclerosis. No evidence of aneurysm or adenopathy. Reproductive: Prior hysterectomy. Mixed solid and cystic mass in the right ovary again measuring 3.9 x 3.4 cm, compared to 3.9 x 3.5 cm previously, unchanged. Other: Small amount of free fluid adjacent to the liver, spleen and in the pelvis. This is new since prior study. Musculoskeletal: Sclerotic area in the posterior L2 vertebral body, 7 mm compared to 6 mm previously. 1.5 cm sclerotic posterior L5 lesion, stable. Chronic moderate L1 compression deformity, stable. Sclerotic lesions in the T9 and T10 vertebral body, similar to prior study. IMPRESSION: Stable left ovarian mass in this patient with known ovarian cancer. Essentially stable sclerotic foci in the thoracic and lumbar spine. Stable moderate compression fracture at L1. Small amount of ascites in the abdomen and pelvis, new since prior study. Trace left pleural effusion. Dependent atelectasis in the lower lobes. Aortic atherosclerosis.  Coronary  artery disease. Electronically Signed   By: Charlett Nose M.D.   On: 12/19/2022 20:15   US Pelvis Complete  Result Date: 12/19/2022 CLINICAL DATA:  Right lower quadrant pain history of ovarian cancer EXAM: TRANSABDOMINAL ULTRASOUND OF PELVIS DOPPLER ULTRASOUND OF OVARIES TECHNIQUE: Transabdominal ultrasound examination of the pelvis was performed including evaluation of the uterus, ovaries, adnexal regions, and pelvic cul-de-sac. Color and duplex Doppler ultrasound was utilized to evaluate blood flow to the ovaries. COMPARISON:  CT 10/15/2022 FINDINGS: Uterus Hysterectomy Endometrium Hysterectomy Right ovary Measurements: 2.5 x 2.2 x 2.7 cm = volume: 8.9 mL. Cystic and soft tissue appearance of the right ovary presumably corresponding to the right adnexal mass seen on multiple prior CT imaging. This measures 3.8 x 3.9 x 4 cm. Left ovary Oophorectomy Pulsed Doppler evaluation demonstrates normal low-resistance arterial and venous waveforms in the right ovary and associated mass Other: Small volume free fluid in the pelvis. IMPRESSION: 1. Status post hysterectomy and left oophorectomy. 2. 4 cm cystic and soft tissue appearance of the right ovary presumably corresponding to the given history and right adnexal mass seen on multiple prior CT imaging. No convincing evidence for torsion on this exam. 3. Small volume free fluid in the pelvis. Electronically Signed   By: Jasmine Pang M.D.   On: 12/19/2022 19:17   [place] Art/Ven Flow Abd Pelv Doppler  Result Date: 12/19/2022 CLINICAL DATA:  Right lower quadrant pain history of ovarian cancer EXAM: TRANSABDOMINAL ULTRASOUND OF PELVIS DOPPLER ULTRASOUND OF OVARIES TECHNIQUE: Transabdominal ultrasound examination of the pelvis was performed including evaluation of the uterus, ovaries, adnexal regions, and pelvic cul-de-sac. Color and  duplex Doppler ultrasound was utilized to evaluate blood flow to the ovaries. COMPARISON:  CT 10/15/2022 FINDINGS: Uterus Hysterectomy  Endometrium Hysterectomy Right ovary Measurements: 2.5 x 2.2 x 2.7 cm = volume: 8.9 mL. Cystic and soft tissue appearance of the right ovary presumably corresponding to the right adnexal mass seen on multiple prior CT imaging. This measures 3.8 x 3.9 x 4 cm. Left ovary Oophorectomy Pulsed Doppler evaluation demonstrates normal low-resistance arterial and venous waveforms in the right ovary and associated mass Other: Small volume free fluid in the pelvis. IMPRESSION: 1. Status post hysterectomy and left oophorectomy. 2. 4 cm cystic and soft tissue appearance of the right ovary presumably corresponding to the given history and right adnexal mass seen on multiple prior CT imaging. No convincing evidence for torsion on this exam. 3. Small volume free fluid in the pelvis. Electronically Signed   By: Jasmine Pang M.D.   On: 12/19/2022 19:17    Microbiology: No results found for this or any previous visit (from the past 240 hour(s)).   Labs: Basic Metabolic Panel: Recent Labs  Lab 01/12/23 1128 01/13/23 0206 01/14/23 0108 01/15/23 0330 01/16/23 0415  NA 133* 133* 134* 134* 135  K 4.0 3.5 4.4 4.0 4.1  CL 97* 97* 97* 98 98  CO2 21* 25 26 28 25   GLUCOSE 88 73 115* 107* 152*  BUN 17 15 9 13 20   CREATININE 0.43* 0.43* 0.50 0.41* 0.69  CALCIUM 8.9 8.7* 8.8* 8.8* 9.0  MG 1.8 1.6* 1.9 1.9 1.8  PHOS 1.9*  --   --   --   --    Liver Function Tests: Recent Labs  Lab 01/11/23 1425 01/12/23 1128  AST 26 20  ALT 20 17  ALKPHOS 177* 150*  BILITOT 0.8 1.1  PROT 6.2* 5.6*  ALBUMIN 2.5* 2.3*   Recent Labs  Lab 01/12/23 1128  LIPASE 45   No results for input(s): "AMMONIA" in the last 168 hours. CBC: Recent Labs  Lab 01/11/23 1425 01/12/23 1128 01/13/23 0206 01/14/23 0629 01/15/23 0330 01/16/23 0415  WBC 13.8* 10.5 8.6 12.5* 9.3 8.6  NEUTROABS 11.7*  --   --   --   --   --   HGB 8.9* 9.1* 8.7* 11.8* 9.5* 10.9*  HCT 28.2* 29.8* 27.9* 35.9* 31.1* 34.5*  MCV 95.6 99.7 97.9 92.8 98.4 95.3   PLT 247 185 167 184 199 206   Cardiac Enzymes: No results for input(s): "CKTOTAL", "CKMB", "CKMBINDEX", "TROPONINI" in the last 168 hours. BNP: BNP (last 3 results) No results for input(s): "BNP" in the last 8760 hours.  ProBNP (last 3 results) No results for input(s): "PROBNP" in the last 8760 hours.  CBG: No results for input(s): "GLUCAP" in the last 168 hours.     Signed:  Ramiro Harvest MD.  [facility] 01/17/2023, 4:44 PM

## 2023-01-17 NOTE — TOC Transition Note (Addendum)
Transition of Care [facility]) - CM/SW Discharge Note   Patient Details  Name: Alexandria Mclaughlin MRN: [id number] Date of Birth: [birth year]  Transition of Care [facility]) CM/SW Contact:  Adrian Prows, RN Phone Number: 01/17/2023, 3:35 PM   Clinical Narrative:    Notified by Henderson Newcomer, [facility] hospital liaison pt has been approved for [facility]; she says pt's son would like to sign consent in person; awaiting consent from pt's son; Efraim Kaufmann will arrange [facility]; no call report or room assignment given; Dr Janee Morn notified; awaiting d/c summary; no TOC needs.   Final next level of care: Hospice Medical Facility Barriers to Discharge: No Barriers Identified   Patient Goals and CMS Choice      Discharge Placement                         Discharge Plan and Services Additional resources added to the After Visit Summary for                                       Social Determinants of Health (SDOH) Interventions SDOH Screenings   Food Insecurity: No Food Insecurity (01/13/2023)  Housing: Patient Declined (01/13/2023)  Transportation Needs: No Transportation Needs (01/13/2023)  Utilities: Not At Risk (01/13/2023)  Depression (PHQ2-9): Low Risk  (01/07/2023)  Social Connections: Unknown (07/04/2021)   Received from [facility], [facility]  Stress: Stress Concern Present (12/30/2022)  Tobacco Use: Medium Risk (01/11/2023)     Readmission Risk Interventions     No data to display

## 2023-01-17 NOTE — Progress Notes (Signed)
Talked to Dr. Vira Blanco regarding De-access PAC due to D/C facility with hospice care, but patient's RN stated that [facility] keep [facility] in. Informed Dr. Vira Blanco regarding this matter and asked put in the order for leave in [facility] needle. Instilled heparin 500 units. Informed patient's RN this matter. HS [employer]

## 2023-01-17 NOTE — Progress Notes (Signed)
Daily Progress Note   Patient Name: Alexandria Mclaughlin       Date: 01/17/2023 DOB: December 05, 1940  Age: 82 y.o. MRN#: [id number] Attending Physician: Rodolph Bong, MD Primary Care Physician: Patient, No Pcp Per Admit Date: 01/11/2023 Length of Stay: 6 days  Reason for Consultation/Follow-up: Establishing goals of care  Subjective:   CC: Patient notes she isn't hungry and is in pain. Following up regarding complex medical decision making.  Subjective:  Reviewed EMR prior to presenting to bedside.  Discussed care with bedside RN for updates.  Presented to bedside to be with patient.  No family present at time of visit.  Patient noted her daughter, Alexandria Mclaughlin, had left not long ago.  Patient more awake and interactive today as compared to yesterday.  Spent time exploring patient's symptom burden at this time.  Patient noting that she is not really hungry and she is still having pain.  Spent time discussing events of yesterday and idea that based on patient's cancer burden, rehab is likely not obtainable at this point.  Patient acknowledged this.  Discussed focusing on her comfort including management of pain, anxiety, and nausea for her time moving forward.  Patient supporting of this idea.  Patient acknowledged that it is difficult for her to take pills so discussed the patient has IV Dilaudid for pain management which she would rather have because it helps more to manage her pain.  Discussed that if she is needing frequent IV medication, may need to consider an infusion which patient acknowledges.  Patient continues to endorse as she is previously stated that she would rather be asleep with pain controlled versus awake with pain present. We had previously discussed that based on patient's wishes idea would be rehab and then hospice.  Patient acknowledges she needs hospice at this time.  Discussed different settings for hospice.  Based on patient's worsening symptom burden, explained inpatient hospice may be  the most appropriate support for her.  Spent time discussing this with patient.  Patient acknowledges that she would want evaluation by [facility] to determine if she could be excepted there. Spent time answering questions as able.  Patient voiced appreciation for the visit.  Noted palliative medicine team will continue to follow along with patient's journey.  Attempted to call daughter, Alexandria Mclaughlin, though unable to reach.  Objective:   Vital Signs:  BP 115/67 (BP Location: Left Arm)   Pulse 87   Temp 98.4 F (36.9 C) (Oral)   Resp 17   Ht 5\' 3"  (1.6 m)   Wt 69.8 kg   SpO2 97%   BMI 27.27 kg/m   Physical Exam: General: Awake, interactive, pale, ill-appearing Cardiovascular: RRR Respiratory: no increased work of breathing noted, not in respiratory distress Neuro: Awake, interactive  Imaging: I personally reviewed recent imaging.   Assessment & Plan:   Assessment: Patient is an 82 year old female with a past medical history of high-grade ovarian cancer on chemotherapy, EP PVD, CVA, CAD, GERD, hyperlipidemia, and hypothyroidism who was admitted on 01/11/2023 for management of nausea, vomiting, and abdominal pain.  Upon admission, imaging showed evidence of SBO as well as new subcapsular perihepatic fluid collection.  Gyn Onc, general surgery, and oncology consulted for evaluation.  Patient deemed not to be a surgical candidate and recommended supportive care.  Palliative medicine team consulted to assist with complex medical decision making.   Recommendations/Plan: # Complex medical decision making/goals of care:  -Discussed care with patient today as detailed above in HPI.  Patient acknowledges  that rehab consideration is no longer appropriate for her based on her cancer burden.  Patient agreeing with the philosophy of comfort focused care.  Patient (previously daughter) have acknowledged wanting referral to [facility] for inpatient hospice evaluation.  At this time patient's priority  is her symptom burden including pain, anxiety, and nausea management.  Patient has stated before and continues to endorse that she would rather be asleep and not in pain then awake and have the pain she has been having.    Code Status: Do not attempt resuscitation (DNR) - Comfort care   # Symptom management                -Pain, acute on chronic in setting of high-grade ovarian cancer Patient now unresponsive after events from rapid response and medical deterioration.  Discontinuing oral medications at this time and transitioning to IV for symptom management.  -Continue IV Dilaudid 0.5-1 mg every hour as needed.  Continue to adjust based on patient's symptom burden.  If patient needing frequent dosing, may need to consider continuous infusion.    -Continue fentanyl 50 mcg/h patch every 72 hours                 -Anxiety/agitation, in the setting of end-of-life care                               -Change IV Ativan to 0.5-1 mg every 4 hours as needed. Continue to adjust based on patient's symptom burden.                                 -Continue IV Haldol 0.5 mg every 4 hours as needed. Continue to adjust based on patient's symptom burden.                   -Secretions, in the setting of end-of-life care                               -Continue IV glycopyrrolate 0.2 mg every 4 hours as needed.                 -Constipation Discontinue oral medications.   -Has bisacodyl suppository daily as needed rectally.   # Psycho-social/Spiritual Support:  - Support System: son, daughter   # Discharge Planning: [facility] assisting with referral for evaluation at inpatient hospice/[facility].   Discussed with: patient, hospitalist, RN  Thank you for allowing the palliative care team to participate in the care Alexandria Mclaughlin.  Alvester Morin, DO Palliative Care Provider PMT # [phone]  If patient remains symptomatic despite maximum doses, please call PMT at [phone] between 0700 and 1900. Outside of these  hours, please call attending, as PMT does not have night coverage.  *Please note that this is a verbal dictation therefore any spelling or grammatical errors are due to the "Dragon Medical One" system interpretation.                                 -

## 2023-01-17 NOTE — Plan of Care (Signed)
  Problem: Nutrition: Goal: Adequate nutrition will be maintained Outcome: Progressing   

## 2023-01-17 NOTE — Plan of Care (Signed)
Problem: Education: Goal: Knowledge of General Education information will improve Description: Including pain rating scale, medication(s)/side effects and non-pharmacologic comfort measures Outcome: Progressing   Problem: Clinical Measurements: Goal: Ability to maintain clinical measurements within normal limits will improve Outcome: Progressing Goal: Diagnostic test results will improve Outcome: Progressing   Problem: Activity: Goal: Risk for activity intolerance will decrease Outcome: Progressing   Problem: Coping: Goal: Level of anxiety will decrease Outcome: Progressing

## 2023-01-17 NOTE — Progress Notes (Signed)
[facility] Liaison Note  Received request from [facility] manager for family interest in [facility]. Eligibility confirmed. Spoke with family to confirm interest and explain services. Family agreeable to transfer today. TOC aware. RN please call report to [phone] prior to patient leaving the unit. Please send signed DNR with patient at discharge.   Thank you for allowing [place] to participate in this patient's care.  Henderson Newcomer, LPN [facility] Liaison [phone]

## 2023-01-17 NOTE — Progress Notes (Addendum)
Report called to [facility] and given to RN receiving pt. Pt discharge packet prepared by TOC and scripts/DNR signed in packet. Pt in possession all belongings labeled and in patient belongings bag. Awaiting [facility] for transport.  1749 [facility] arrived for transport. Pt left with all belongings. Unable to reach son [name] but reached daughter [name] and informed of pt leaving.

## 2023-01-19 ENCOUNTER — Encounter: Payer: Self-pay | Admitting: General Practice

## 2023-01-19 NOTE — Progress Notes (Signed)
[facility] Spiritual Care Note  Noting discharge to [facility], left voicemail of support and care, with prayers for comfort, peace, and rest.   Chaplain Rush Barer, MDiv, [facility] Pager [phone] Voicemail [phone]

## 2023-01-19 NOTE — Patient Outreach (Signed)
  Care Coordination   Follow Up Visit Note   01/19/2023 Name: Alexandria Mclaughlin MRN: [id number] DOB: 05/08/1940  Alexandria Mclaughlin is a 82 y.o. year old female who sees  Dr. Susanne Borders for her primary care.  Per communication received from Charlesetta Shanks, RN [facility] Liaison for [facility], the patient discharged from her inpatient status to [facility], a residential hospice facility, on 01/19/2023.       Goals Addressed             This Visit's Progress    COMPLETED: TRANSITION OF CARE Plan       Current Barriers:  Knowledge Deficits related to plan of care for management of acute on chronic pain management due to advanced stage ovarian cancer   RNCM Clinical Goal(s):  Patient will work with the Care Management team over the next 30 days to address Transition of Care Barriers: Medication Management Provider appointments Home Health services Functional/Safety Take all medications exactly as prescribed and will call provider for medication related questions as evidenced by verbalization of adequate/baseline pain control (patient pain rating goal-5 or less), notification to her treatment team of inadequate pain control and/or adverse medication effects, ongoing and timely communication through collaboration with RN Care manager, provider, and care team.   Interventions: Patient discharged to residential hospice facility 01/19/2023, case closed to [facility] Program.   Pain Interventions:  Patient discharged to residential hospice facility 01/19/2023, case closed to [facility] Program.   Patient Goals/Self-Care Activities:  Patient discharged to residential hospice facility 01/19/2023, case closed to [facility] Program.   Follow Up Plan:   Patient discharged to residential hospice facility, [facility], on 01/19/2023, case closed to [facility] Program.  Wyline Mood BSN, RN RN Care Manager   Transitions of Care [facility] - [facility] Health Direct Dial Number:  [phone]            Care Coordination Interventions:  01/19/23 - Communication received from [facility] Liaison that patient discharged to residential hospice facility on 01/19/23.  Encounter Outcome:  Case closed to [facility] Program.  Tashae Inda Samuel Bouche, RN RN Care Manager   Transitions of Care [facility] - [facility] Health Direct Dial Number:  [phone]

## 2023-01-20 ENCOUNTER — Telehealth: Payer: Self-pay | Admitting: Licensed Clinical Social Worker

## 2023-01-20 NOTE — Telephone Encounter (Signed)
[facility] Clinical Social Work  CSW attempted to contact pt by phone to offer support and care after pt discharged to [facility]. No answer. VM full, unable to leave message.   Harace Mccluney E Roby Donaway, LCSW

## 2023-01-23 ENCOUNTER — Ambulatory Visit: Payer: Medicare HMO

## 2023-01-23 ENCOUNTER — Other Ambulatory Visit: Payer: Medicare HMO

## 2023-01-27 ENCOUNTER — Ambulatory Visit: Payer: Medicare HMO

## 2023-01-27 ENCOUNTER — Inpatient Hospital Stay: Payer: Medicare HMO | Admitting: Licensed Clinical Social Worker

## 2023-01-27 ENCOUNTER — Ambulatory Visit: Payer: Medicare HMO | Admitting: Hematology and Oncology

## 2023-01-27 ENCOUNTER — Other Ambulatory Visit: Payer: Medicare HMO

## 2023-02-25 DEATH — deceased

## 2023-03-04 ENCOUNTER — Telehealth: Payer: Self-pay | Admitting: Registered Nurse

## 2023-03-04 ENCOUNTER — Encounter: Payer: Self-pay | Attending: Physical Medicine and Rehabilitation | Admitting: Registered Nurse

## 2023-03-04 NOTE — Progress Notes (Deleted)
 Subjective:    Patient ID: Alexandria Mclaughlin, female    DOB: 1940-05-07, 83 y.o.   MRN: [id number]  HPI   Pain Inventory Average Pain {NUMBERS; 0-10:5044} Pain Right Now {NUMBERS; 0-10:5044} My pain is {PAIN DESCRIPTION:21022940}  In the last 24 hours, has pain interfered with the following? General activity {NUMBERS; 0-10:5044} Relation with others {NUMBERS; 0-10:5044} Enjoyment of life {NUMBERS; 0-10:5044} What TIME of day is your pain at its worst? {time of day:24191} Sleep (in general) {BHH GOOD/FAIR/POOR:22877}  Pain is worse with: {ACTIVITIES:21022942} Pain improves with: {PAIN IMPROVES TPUY:78977056} Relief from Meds: {NUMBERS; 0-10:5044}  Family History  Problem Relation Age of Onset   Diabetes Mother    Heart disease Mother    Stroke Mother    Heart disease Father    Heart disease Brother    Colon cancer Neg Hx    Breast cancer Neg Hx    Ovarian cancer Neg Hx    Endometrial cancer Neg Hx    Pancreatic cancer Neg Hx    Prostate cancer Neg Hx    Social History   Socioeconomic History   Marital status: Widowed    Spouse name: Not on file   Number of children: Not on file   Years of education: 14   Highest education level: Associate degree: academic program  Occupational History   Occupation: Retired  Tobacco Use   Smoking status: Former    Current packs/day: 0.00    Average packs/day: 0.3 packs/day for 15.0 years (3.8 ttl pk-yrs)    Types: Cigarettes    Start date: 03/15/1953    Quit date: 03/15/1968    Years since quitting: 55.0   Smokeless tobacco: Never  Vaping Use   Vaping status: Never Used  Substance and Sexual Activity   Alcohol  use: Not Currently    Comment: occasional glass of wine   Drug use: Yes    Frequency: 20.0 times per week    Types: Fentanyl , Hydrocodone   Sexual activity: Not Currently  Other Topics Concern   Not on file  Social History Narrative   Right handed   Drinks caffeine    Condo two story with engineer, structural   Social Drivers  of Health   Financial Resource Strain: Not on file  Food Insecurity: No Food Insecurity (01/13/2023)   Hunger Vital Sign    Worried About Running Out of Food in the Last Year: Never true    Ran Out of Food in the Last Year: Never true  Transportation Needs: No Transportation Needs (01/13/2023)   PRAPARE - Administrator, Civil Service (Medical): No    Lack of Transportation (Non-Medical): No  Physical Activity: Not on file  Stress: Stress Concern Present (12/30/2022)   [employer] - Occupational Stress Questionnaire    Feeling of Stress : To some extent  Social Connections: Unknown (07/04/2021)   Received from [facility]   Social Network    Social Network: Not on file   Past Surgical History:  Procedure Laterality Date   ABDOMINAL HYSTERECTOMY     Due to fibroids   BACK SURGERY  2011   lumbar Fusion   BLADDER SURGERY  1985   CESAREAN SECTION     x2   EYE SURGERY Bilateral    Cataract surgery with IOL   FOOT SURGERY Right    just scraped the bone   IR BLOCK INJ HYPOGASTRIC PLEXUS  12/23/2022   IR IMAGING GUIDED PORT INSERTION  05/26/2022  LAPAROSCOPY N/A 04/08/2022   Procedure: LAPAROSCOPY DIAGNOSTIC WITH BIOPSIES;  Surgeon: Eldonna Mays, MD;  Location: [facility];  Service: Gynecology;  Laterality: N/A;   RETINAL DETACHMENT SURGERY Right 10/2011   TRANSFORAMINAL LUMBAR INTERBODY FUSION W/ MIS 1 LEVEL Right 04/26/2018   Procedure: Right Lumbar four-five Minimally invasive Transforaminal lumbar interbody fusion;  Surgeon: Cheryle Debby LABOR, MD;  Location: [facility];  Service: Neurosurgery;  Laterality: Right;   Past Surgical History:  Procedure Laterality Date   ABDOMINAL HYSTERECTOMY     Due to fibroids   BACK SURGERY  2011   lumbar Fusion   BLADDER SURGERY  1985   CESAREAN SECTION     x2   EYE SURGERY Bilateral    Cataract surgery with IOL   FOOT SURGERY Right    just scraped the bone   IR BLOCK INJ  HYPOGASTRIC PLEXUS  12/23/2022   IR IMAGING GUIDED PORT INSERTION  05/26/2022   LAPAROSCOPY N/A 04/08/2022   Procedure: LAPAROSCOPY DIAGNOSTIC WITH BIOPSIES;  Surgeon: Eldonna Mays, MD;  Location: [facility];  Service: Gynecology;  Laterality: N/A;   RETINAL DETACHMENT SURGERY Right 10/2011   TRANSFORAMINAL LUMBAR INTERBODY FUSION W/ MIS 1 LEVEL Right 04/26/2018   Procedure: Right Lumbar four-five Minimally invasive Transforaminal lumbar interbody fusion;  Surgeon: Cheryle Debby LABOR, MD;  Location: [facility];  Service: Neurosurgery;  Laterality: Right;   Past Medical History:  Diagnosis Date   Abnormality of gait 02/07/2015   Acquired hallux rigidus of left foot 12/17/2020   Acquired unequal leg length on left 08/08/2011   AMS (altered mental status) 12/28/2020   Biceps tendonitis on left 08/18/2018   BPPV (benign paroxysmal positional vertigo) 02/07/2015   Bunion 12/17/2020   Cancer (HCC) 04/25/2022   Cerebrovascular disease    Cervical facet syndrome    Coronary artery disease 04/27/2022   Disorders of sacrum    Enthesopathy of hip region    Essential hypertension 02/07/2015   Generalized anxiety disorder    GERD (gastroesophageal reflux disease)    Headache    History of kidney stones    HLD (hyperlipidemia) 02/07/2015   Low back pain 02/07/2015   Lumbar facet arthropathy 07/05/2014   Lumbar post-laminectomy syndrome 06/11/2011   Lumbar radicular pain 07/29/2016   Lumbar radiculopathy 04/26/2018   Major depressive disorder    Mild cognitive impairment of uncertain or unknown etiology 01/24/2022   Multiple lacunar infarcts    MRI - bilateral basal ganglia and left thalamus   Myoclonus 09/19/2015   Rotator cuff tendonitis, left 06/08/2018   Sacroiliac joint dysfunction 06/11/2011   Sciatic neuritis    Stroke (HCC)    hx TIA   Subcortical infarction    2016 MRI - small chronic subcortical infarct with associated chronic hemosiderin deposition within the right superior frontal  gyrus.   Synovitis and tenosynovitis    Therapeutic opioid induced constipation 07/25/2015   Thyroid  disease    Transient left leg weakness    Vision abnormalities    There were no vitals taken for this visit.  Opioid Risk Score:   Fall Risk Score:  `1  Depression screen PHQ 2/9     01/07/2023    1:29 PM 11/19/2022    2:46 PM 10/15/2022    1:24 PM 08/21/2022    3:01 PM 04/24/2022    1:11 PM 02/25/2022    1:02 PM 08/28/2021    2:31 PM  Depression screen PHQ 2/9  Decreased Interest 0 1 1 1 1  0 0  Down, Depressed,  Hopeless 0 1 1 1 1  0 0  PHQ - 2 Score 0 2 2 2 2  0 0    Review of Systems     Objective:   Physical Exam        Assessment & Plan:

## 2023-03-04 NOTE — Telephone Encounter (Signed)
 Son called and reports Patient deceased as of 02-25-23. He wishes for no more communication from our office.
